# Patient Record
Sex: Female | Born: 1955 | Race: White | Hispanic: No | Marital: Married | Smoking: Former smoker
Health system: Southern US, Community
[De-identification: ages and names within clinical notes are randomized; demographics above are authoritative.]

## PROBLEM LIST (undated history)

## (undated) DIAGNOSIS — F419 Anxiety disorder, unspecified: Secondary | ICD-10-CM

## (undated) DIAGNOSIS — M1990 Unspecified osteoarthritis, unspecified site: Secondary | ICD-10-CM

## (undated) DIAGNOSIS — R0600 Dyspnea, unspecified: Secondary | ICD-10-CM

## (undated) DIAGNOSIS — E041 Nontoxic single thyroid nodule: Secondary | ICD-10-CM

## (undated) DIAGNOSIS — K219 Gastro-esophageal reflux disease without esophagitis: Secondary | ICD-10-CM

## (undated) DIAGNOSIS — I2510 Atherosclerotic heart disease of native coronary artery without angina pectoris: Secondary | ICD-10-CM

## (undated) DIAGNOSIS — J189 Pneumonia, unspecified organism: Secondary | ICD-10-CM

## (undated) DIAGNOSIS — K449 Diaphragmatic hernia without obstruction or gangrene: Secondary | ICD-10-CM

## (undated) DIAGNOSIS — G4730 Sleep apnea, unspecified: Secondary | ICD-10-CM

## (undated) DIAGNOSIS — M797 Fibromyalgia: Secondary | ICD-10-CM

## (undated) DIAGNOSIS — N200 Calculus of kidney: Secondary | ICD-10-CM

## (undated) DIAGNOSIS — D649 Anemia, unspecified: Secondary | ICD-10-CM

## (undated) DIAGNOSIS — Z87442 Personal history of urinary calculi: Secondary | ICD-10-CM

## (undated) DIAGNOSIS — F32A Depression, unspecified: Secondary | ICD-10-CM

## (undated) DIAGNOSIS — I82409 Acute embolism and thrombosis of unspecified deep veins of unspecified lower extremity: Secondary | ICD-10-CM

## (undated) DIAGNOSIS — G43909 Migraine, unspecified, not intractable, without status migrainosus: Secondary | ICD-10-CM

## (undated) DIAGNOSIS — J449 Chronic obstructive pulmonary disease, unspecified: Secondary | ICD-10-CM

## (undated) DIAGNOSIS — E042 Nontoxic multinodular goiter: Secondary | ICD-10-CM

## (undated) HISTORY — DX: Calculus of kidney: N20.0

## (undated) HISTORY — PX: TONSILLECTOMY: SUR1361

## (undated) HISTORY — PX: PAROTIDECTOMY: SUR1003

## (undated) HISTORY — DX: Gastro-esophageal reflux disease without esophagitis: K21.9

## (undated) HISTORY — DX: Atherosclerotic heart disease of native coronary artery without angina pectoris: I25.10

## (undated) HISTORY — PX: ABDOMINAL HYSTERECTOMY: SHX81

## (undated) HISTORY — DX: Migraine, unspecified, not intractable, without status migrainosus: G43.909

## (undated) HISTORY — DX: Diaphragmatic hernia without obstruction or gangrene: K44.9

## (undated) HISTORY — PX: HERNIA REPAIR: SHX51

## (undated) HISTORY — PX: KIDNEY STONE SURGERY: SHX686

## (undated) HISTORY — DX: Fibromyalgia: M79.7

## (undated) HISTORY — PX: BIOPSY THYROID: PRO38

## (undated) HISTORY — PX: LAPAROSCOPIC NISSEN FUNDOPLICATION: SHX1932

## (undated) HISTORY — DX: Nontoxic single thyroid nodule: E04.1

## (undated) HISTORY — DX: Depression, unspecified: F32.A

---

## 1994-12-14 DIAGNOSIS — I2699 Other pulmonary embolism without acute cor pulmonale: Secondary | ICD-10-CM

## 1994-12-14 HISTORY — DX: Other pulmonary embolism without acute cor pulmonale: I26.99

## 2014-05-10 DIAGNOSIS — K219 Gastro-esophageal reflux disease without esophagitis: Secondary | ICD-10-CM

## 2014-05-10 DIAGNOSIS — M797 Fibromyalgia: Secondary | ICD-10-CM

## 2021-02-12 DIAGNOSIS — Z86711 Personal history of pulmonary embolism: Secondary | ICD-10-CM

## 2021-02-15 DIAGNOSIS — F419 Anxiety disorder, unspecified: Secondary | ICD-10-CM | POA: Insufficient documentation

## 2021-07-01 DIAGNOSIS — G4733 Obstructive sleep apnea (adult) (pediatric): Secondary | ICD-10-CM

## 2021-07-01 DIAGNOSIS — D649 Anemia, unspecified: Secondary | ICD-10-CM | POA: Insufficient documentation

## 2021-07-01 DIAGNOSIS — Z86718 Personal history of other venous thrombosis and embolism: Secondary | ICD-10-CM | POA: Insufficient documentation

## 2021-07-01 DIAGNOSIS — I825Z9 Chronic embolism and thrombosis of unspecified deep veins of unspecified distal lower extremity: Secondary | ICD-10-CM | POA: Insufficient documentation

## 2021-07-01 DIAGNOSIS — I2510 Atherosclerotic heart disease of native coronary artery without angina pectoris: Secondary | ICD-10-CM | POA: Insufficient documentation

## 2021-07-01 HISTORY — DX: Obstructive sleep apnea (adult) (pediatric): G47.33

## 2021-08-25 ENCOUNTER — Other Ambulatory Visit: Payer: Self-pay

## 2021-08-25 ENCOUNTER — Ambulatory Visit
Admission: EM | Admit: 2021-08-25 | Discharge: 2021-08-25 | Disposition: A | Discharge status: Home / Self Care | Payer: Medicare Other

## 2021-08-25 DIAGNOSIS — L309 Dermatitis, unspecified: Secondary | ICD-10-CM | POA: Diagnosis not present

## 2021-08-25 DIAGNOSIS — R21 Rash and other nonspecific skin eruption: Secondary | ICD-10-CM | POA: Diagnosis not present

## 2021-08-25 DIAGNOSIS — L509 Urticaria, unspecified: Secondary | ICD-10-CM

## 2021-08-25 HISTORY — DX: Chronic obstructive pulmonary disease, unspecified: J44.9

## 2021-08-25 MED ORDER — METHYLPREDNISOLONE SODIUM SUCC 125 MG IJ SOLR
62.5000 mg | Freq: Once | INTRAMUSCULAR | Status: AC
  Administered 2021-08-25: 62.5 mg via INTRAMUSCULAR

## 2021-08-25 MED ORDER — PREDNISONE 10 MG (21) PO TBPK: ORAL_TABLET | Freq: Every day | ORAL | 0 refills | Status: DC

## 2021-08-25 MED ORDER — FLUCONAZOLE 150 MG PO TABS: 150.0000 mg | ORAL_TABLET | Freq: Every day | ORAL | 1 refills | Status: AC

## 2021-08-25 NOTE — Discharge Instructions (Addendum)
You were given a steroid shot SoluMedrol 62.5mg  today to help with rash/itching. Then start Prednisone 10mg  tablets- take 6 tablets today and tomorrow and then decrease by 1 tablet every 2 days until finished. May use OTC Pepcid 20mg  twice a day to help with itching. Apply cool compresses to area for comfort. May take Diflucan 150mg  once daily for 1 week to help with probable fungal rash. Continue to monitor symptoms. Recommend call Dermatology to schedule appointment for follow-up or see your PCP for further evaluation and possible referral.

## 2021-08-25 NOTE — ED Triage Notes (Signed)
Pt presents with rash on face and lower back that was treated with steroids and ten returned

## 2021-08-25 NOTE — ED Provider Notes (Signed)
[facility]    CSN: [id number] Arrival date & time: 08/25/21  1044      History   Chief Complaint Chief Complaint  Patient presents with   Rash    HPI Norma Anthony is a 65 y.o. female.   65 year old female accompanied by her husband with concern over rash that started on her face. Was seen by her PCP in [place] about 2 1/2 weeks ago for a rash that started on her face about 3 weeks ago. Also had a few lesions on her body. Was treated with a Kenalog steroid shot and placed on Prednisone 6 day taper dose pack. Lesions resolved on face but then return a few days ago. Itchy and spreading with lesions on mid to lower back and scattered lesions on lower legs, abdomen and arms. Area of redness under right breast that she feels may be yeast/fungal. She has tried using topical anti-fungal agent under breast area with no relief. She has tried OTC Cortisone-10 with no relief. No known new detergents, soap or medications. No new foods. Has moved up to [place] area recently but plans to keep her PCP in [place]. No other family members with rash or similar symptoms. No fever, nausea or vomiting. Other chronic health issues include sleep apnea, COPD, GERD, history of DVT, anxiety, and migraine headaches. Currently on Xarelto, aspirin, Protonix, Gabapentin, Cymbalta, Wellbutrin, Soma daily and Xanax and Albuterol prn.   The history is provided by the patient.   Past Medical History:  Diagnosis Date   COPD (chronic obstructive pulmonary disease) (HCC)     There are no problems to display for this patient.   Past Surgical History:  Procedure Laterality Date   ABDOMINAL HYSTERECTOMY     HERNIA REPAIR     TONSILLECTOMY      OB History   No obstetric history on file.      Home Medications    Prior to Admission medications   Medication Sig Start Date End Date Taking? Authorizing Provider  albuterol (VENTOLIN HFA) 108 (90 Base) MCG/ACT inhaler Inhale into the  lungs. 05/12/21  Yes [provider]  buPROPion (WELLBUTRIN SR) 150 MG 12 hr tablet  04/09/20  Yes [provider]  carisoprodol (SOMA) 350 MG tablet Take by mouth. 01/02/21  Yes [provider]  DULoxetine (CYMBALTA) 60 MG capsule Take by mouth. 09/14/18  Yes [provider]  fluconazole (DIFLUCAN) 150 MG tablet Take 1 tablet (150 mg total) by mouth daily for 7 days. 08/25/21 09/01/21 Yes Pascale Maves, Ali LoweAnn Berry, NP  gabapentin (NEURONTIN) 300 MG capsule  02/25/20  Yes [provider]  pantoprazole (PROTONIX) 40 MG tablet Take by mouth. 06/30/16  Yes [provider]  predniSONE (STERAPRED UNI-PAK 21 TAB) 10 MG (21) TBPK tablet Take by mouth daily. Take 6 tabs by mouth daily  for 2 days, then 5 tabs for 2 days, then 4 tabs for 2 days, then 3 tabs for 2 days, 2 tabs for 2 days, then 1 tab by mouth daily for 2 days 08/25/21  Yes Canary Fister, Ali LoweAnn Berry, NP  ALPRAZolam Prudy Feeler(XANAX) 1 MG tablet Take by mouth.    [provider]  aspirin 81 MG EC tablet Take by mouth.    [provider]  XARELTO 20 MG TABS tablet Take 20 mg by mouth daily. 08/21/21   [provider]    Family History Family History  Family history unknown: Yes    Social History Social History   Tobacco  Use   Smoking status: Never   Smokeless tobacco: Never  Substance Use Topics   Alcohol use: Not Currently   Drug use: Not Currently     Allergies   Pregabalin, Droperidol, and Ergotamine-caffeine   Review of Systems Review of Systems  Constitutional:  Positive for fatigue. Negative for activity change, appetite change, chills and fever.  HENT:  Negative for mouth sores, sore throat and trouble swallowing.   Respiratory:  Negative for chest tightness and wheezing.   Gastrointestinal:  Negative for nausea and vomiting.  Musculoskeletal:  Positive for arthralgias and back pain. Negative for neck pain.  Skin:  Positive for color change and rash.  Allergic/Immunologic:  Negative for environmental allergies and food allergies.  Neurological:  Negative for dizziness, tremors, seizures, syncope, facial asymmetry, speech difficulty and numbness.  Hematological:  Negative for adenopathy. Bruises/bleeds easily.  Psychiatric/Behavioral:  Positive for sleep disturbance.     Physical Exam Triage Vital Signs ED Triage Vitals  Enc Vitals Group     BP 08/25/21 1427 110/77     Pulse Rate 08/25/21 1427 77     Resp 08/25/21 1427 20     Temp 08/25/21 1427 98.1 F (36.7 C)     Temp src --      SpO2 08/25/21 1427 91 %     Weight --      Height --      Head Circumference --      Peak Flow --      Pain Score 08/25/21 1419 0     Pain Loc --      Pain Edu? --      Excl. in GC? --    No data found.  Updated Vital Signs BP 110/77   Pulse 77   Temp 98.1 F (36.7 C)   Resp 20   SpO2 91% Comment: states it is baseline  Visual Acuity Right Eye Distance:   Left Eye Distance:   Bilateral Distance:    Right Eye Near:   Left Eye Near:    Bilateral Near:     Physical Exam Vitals and nursing note reviewed.  Constitutional:      General: She is awake. She is not in acute distress.    Appearance: She is well-developed, well-groomed and overweight.     Comments: She is sitting on the exam table in no acute distress but appears uncomfortable due to itchy rash.   HENT:     Head: Normocephalic and atraumatic.      Comments: Small pink papular lesions present on face, mainly on bridge of nose and then mid to lower cheeks bilaterally. Also around mouth. No lesions around eyes or forehead. No surrounding erythema. NO signs of bacterial infection.     Right Ear: Hearing and external ear normal.     Left Ear: Hearing and external ear normal.     Nose: No congestion or rhinorrhea.     Mouth/Throat:     Lips: Pink.     Mouth: Mucous membranes are moist.     Pharynx: Oropharynx is clear.  Eyes:     Extraocular Movements: Extraocular movements intact.      Conjunctiva/sclera: Conjunctivae normal.  Cardiovascular:     Rate and Rhythm: Normal rate.  Pulmonary:     Effort: Pulmonary effort is normal.  Musculoskeletal:     Cervical back: Normal range of motion and neck supple.  Lymphadenopathy:     Cervical: No cervical adenopathy.  Skin:    General: Skin is  warm.     Capillary Refill: Capillary refill takes less than 2 seconds.     Findings: Lesion and rash present. Rash is macular, papular and urticarial. Rash is not crusting, nodular, purpuric, pustular, scaling or vesicular.          Comments: A few scattered pink papular lesion on lower legs and lower arms but most lesions concentrated on face.  Then cluster of pink urticarial appearing lesions on mid to lower back. No vesicular lesions. Non-tender. No secondary bacterial infection. Pink mostly macular rash present under right breast with distinct edges and slightly moist. Non-tender. No secondary bacterial infection.   Neurological:     General: No focal deficit present.     Mental Status: She is alert and oriented to person, place, and time.  Psychiatric:        Mood and Affect: Mood normal.        Behavior: Behavior normal. Behavior is cooperative.        Thought Content: Thought content normal.        Judgment: Judgment normal.     UC Treatments / Results  Labs (all labs ordered are listed, but only abnormal results are displayed) Labs Reviewed - No data to display  EKG   Radiology No results found.  Procedures Procedures (including critical care time)  Medications Ordered in UC Medications  methylPREDNISolone sodium succinate (SOLU-MEDROL) 125 mg/2 mL injection 62.5 mg (62.5 mg Intramuscular Given 08/25/21 1539)    Initial Impression / Assessment and Plan / UC Course  I have reviewed the triage vital signs and the nursing notes.  Pertinent labs & imaging results that were available during my care of the patient were reviewed by me and considered in my medical  decision making (see chart for details).    Discussed that lesions/rash of face appear to be a dermatitis. Pattern consistent with face covering but patient denies any new mask or extended use of face coverings. No new foods.  Reviewed that lesions on back appear more hive-like so again- having a reaction to some substance but unknown.  Lesions under breast do appear fungal and will treat with oral Diflucan 150mg  once daily per patient's request. May need to treat with different oral anti-fungal if not resolving. Try to keep area clean and dry.  Gave Solu-Medrol 62.5mg  IM today per patient's request to help with rash and itching. Recommend start Prednisone 10mg  12 day dose pack as directed. May use OTC Pepcid 20mg  twice a day to try to help with itching. Apply cool compresses to back and face for comfort. Discussed that rash may continue until source of irritation discovered. Patient requests information for local dermatologist. May call Dermatologist in [place] today to schedule appointment for further evaluation. If rash gets worse or any fever develops, recommend go to the ER. Otherwise, follow-up with Dermatology or her PCP for further evaluation and referral to local dermatologist.  Final Clinical Impressions(s) / UC Diagnoses   Final diagnoses:  Dermatitis of face  Urticaria of unknown origin  Rash and nonspecific skin eruption     Discharge Instructions      You were given a steroid shot SoluMedrol 62.5mg  today to help with rash/itching. Then start Prednisone 10mg  tablets- take 6 tablets today and tomorrow and then decrease by 1 tablet every 2 days until finished. May use OTC Pepcid 20mg  twice a day to help with itching. Apply cool compresses to area for comfort. May take Diflucan 150mg  once daily for  1 week to help with probable fungal rash. Continue to monitor symptoms. Recommend call [facility] to schedule appointment for follow-up or see your PCP for further  evaluation and possible referral.      ED Prescriptions     Medication Sig Dispense Auth. Provider   predniSONE (STERAPRED UNI-PAK 21 TAB) 10 MG (21) TBPK tablet Take by mouth daily. Take 6 tabs by mouth daily  for 2 days, then 5 tabs for 2 days, then 4 tabs for 2 days, then 3 tabs for 2 days, 2 tabs for 2 days, then 1 tab by mouth daily for 2 days 42 tablet Charvez Voorhies, Ali Lowe, NP   fluconazole (DIFLUCAN) 150 MG tablet Take 1 tablet (150 mg total) by mouth daily for 7 days. 7 tablet Nazli Penn, Ali Lowe, NP      PDMP not reviewed this encounter.   Sudie Grumbling, NP 08/26/21 1523

## 2021-11-17 ENCOUNTER — Other Ambulatory Visit (HOSPITAL_COMMUNITY): Payer: Self-pay | Admitting: Internal Medicine

## 2021-11-17 DIAGNOSIS — Z1231 Encounter for screening mammogram for malignant neoplasm of breast: Secondary | ICD-10-CM

## 2021-11-24 ENCOUNTER — Ambulatory Visit (HOSPITAL_COMMUNITY)
Admission: RE | Admit: 2021-11-24 | Discharge: 2021-11-24 | Disposition: A | Discharge status: Home / Self Care | Payer: Medicare Other | Source: Ambulatory Visit | Attending: Internal Medicine | Admitting: Internal Medicine

## 2021-11-24 ENCOUNTER — Other Ambulatory Visit: Payer: Self-pay

## 2021-11-24 DIAGNOSIS — Z1231 Encounter for screening mammogram for malignant neoplasm of breast: Secondary | ICD-10-CM | POA: Diagnosis not present

## 2021-12-01 ENCOUNTER — Inpatient Hospital Stay
Admission: RE | Admit: 2021-12-01 | Discharge: 2021-12-01 | Disposition: A | Discharge status: Home / Self Care | Payer: Self-pay | Source: Ambulatory Visit | Attending: Internal Medicine | Admitting: Internal Medicine

## 2021-12-01 ENCOUNTER — Other Ambulatory Visit (HOSPITAL_COMMUNITY): Payer: Self-pay | Admitting: Internal Medicine

## 2021-12-01 DIAGNOSIS — Z1231 Encounter for screening mammogram for malignant neoplasm of breast: Secondary | ICD-10-CM

## 2022-03-22 ENCOUNTER — Inpatient Hospital Stay (HOSPITAL_COMMUNITY)
Admission: EM | Admit: 2022-03-22 | Discharge: 2022-03-24 | DRG: 193 | Disposition: A | Discharge status: Home / Self Care | Payer: Medicare Other | Attending: Internal Medicine | Admitting: Internal Medicine

## 2022-03-22 ENCOUNTER — Encounter (HOSPITAL_COMMUNITY): Payer: Self-pay

## 2022-03-22 ENCOUNTER — Other Ambulatory Visit: Payer: Self-pay

## 2022-03-22 ENCOUNTER — Emergency Department (HOSPITAL_COMMUNITY): Payer: Medicare Other

## 2022-03-22 DIAGNOSIS — J9811 Atelectasis: Secondary | ICD-10-CM | POA: Diagnosis present

## 2022-03-22 DIAGNOSIS — Z91199 Patient's noncompliance with other medical treatment and regimen due to unspecified reason: Secondary | ICD-10-CM

## 2022-03-22 DIAGNOSIS — J9621 Acute and chronic respiratory failure with hypoxia: Secondary | ICD-10-CM | POA: Diagnosis not present

## 2022-03-22 DIAGNOSIS — Z86718 Personal history of other venous thrombosis and embolism: Secondary | ICD-10-CM

## 2022-03-22 DIAGNOSIS — F32A Depression, unspecified: Secondary | ICD-10-CM | POA: Diagnosis present

## 2022-03-22 DIAGNOSIS — Z7982 Long term (current) use of aspirin: Secondary | ICD-10-CM

## 2022-03-22 DIAGNOSIS — R079 Chest pain, unspecified: Secondary | ICD-10-CM

## 2022-03-22 DIAGNOSIS — E041 Nontoxic single thyroid nodule: Secondary | ICD-10-CM | POA: Diagnosis present

## 2022-03-22 DIAGNOSIS — K5900 Constipation, unspecified: Secondary | ICD-10-CM | POA: Diagnosis present

## 2022-03-22 DIAGNOSIS — J9601 Acute respiratory failure with hypoxia: Secondary | ICD-10-CM

## 2022-03-22 DIAGNOSIS — J189 Pneumonia, unspecified organism: Secondary | ICD-10-CM

## 2022-03-22 DIAGNOSIS — K219 Gastro-esophageal reflux disease without esophagitis: Secondary | ICD-10-CM | POA: Diagnosis not present

## 2022-03-22 DIAGNOSIS — G4733 Obstructive sleep apnea (adult) (pediatric): Secondary | ICD-10-CM

## 2022-03-22 DIAGNOSIS — R911 Solitary pulmonary nodule: Secondary | ICD-10-CM | POA: Diagnosis present

## 2022-03-22 DIAGNOSIS — J441 Chronic obstructive pulmonary disease with (acute) exacerbation: Secondary | ICD-10-CM | POA: Diagnosis present

## 2022-03-22 DIAGNOSIS — Z79899 Other long term (current) drug therapy: Secondary | ICD-10-CM

## 2022-03-22 DIAGNOSIS — J440 Chronic obstructive pulmonary disease with acute lower respiratory infection: Secondary | ICD-10-CM | POA: Diagnosis present

## 2022-03-22 DIAGNOSIS — M797 Fibromyalgia: Secondary | ICD-10-CM | POA: Diagnosis present

## 2022-03-22 DIAGNOSIS — Z7901 Long term (current) use of anticoagulants: Secondary | ICD-10-CM

## 2022-03-22 DIAGNOSIS — F1120 Opioid dependence, uncomplicated: Secondary | ICD-10-CM | POA: Diagnosis present

## 2022-03-22 DIAGNOSIS — J449 Chronic obstructive pulmonary disease, unspecified: Secondary | ICD-10-CM

## 2022-03-22 DIAGNOSIS — J181 Lobar pneumonia, unspecified organism: Secondary | ICD-10-CM | POA: Diagnosis not present

## 2022-03-22 DIAGNOSIS — Z20822 Contact with and (suspected) exposure to covid-19: Secondary | ICD-10-CM | POA: Diagnosis present

## 2022-03-22 DIAGNOSIS — Z86711 Personal history of pulmonary embolism: Secondary | ICD-10-CM

## 2022-03-22 DIAGNOSIS — Z888 Allergy status to other drugs, medicaments and biological substances status: Secondary | ICD-10-CM

## 2022-03-22 DIAGNOSIS — F419 Anxiety disorder, unspecified: Secondary | ICD-10-CM | POA: Diagnosis present

## 2022-03-22 LAB — PROCALCITONIN: Procalcitonin: 0.17 ng/mL

## 2022-03-22 LAB — CBC WITH DIFFERENTIAL/PLATELET
Abs Immature Granulocytes: 0.09 10*3/uL — ABNORMAL HIGH (ref 0.00–0.07)
Basophils Absolute: 0.1 10*3/uL (ref 0.0–0.1)
Basophils Relative: 0 %
Eosinophils Absolute: 0.2 10*3/uL (ref 0.0–0.5)
Eosinophils Relative: 1 %
HCT: 36.8 % (ref 36.0–46.0)
Hemoglobin: 11.5 g/dL — ABNORMAL LOW (ref 12.0–15.0)
Immature Granulocytes: 1 %
Lymphocytes Relative: 14 %
Lymphs Abs: 2.6 10*3/uL (ref 0.7–4.0)
MCH: 31.8 pg (ref 26.0–34.0)
MCHC: 31.3 g/dL (ref 30.0–36.0)
MCV: 101.7 fL — ABNORMAL HIGH (ref 80.0–100.0)
Monocytes Absolute: 0.8 10*3/uL (ref 0.1–1.0)
Monocytes Relative: 4 %
Neutro Abs: 15.2 10*3/uL — ABNORMAL HIGH (ref 1.7–7.7)
Neutrophils Relative %: 80 %
Platelets: 174 10*3/uL (ref 150–400)
RBC: 3.62 MIL/uL — ABNORMAL LOW (ref 3.87–5.11)
RDW: 14.1 % (ref 11.5–15.5)
WBC: 18.9 10*3/uL — ABNORMAL HIGH (ref 4.0–10.5)
nRBC: 0 % (ref 0.0–0.2)

## 2022-03-22 LAB — URINALYSIS, ROUTINE W REFLEX MICROSCOPIC
Bilirubin Urine: NEGATIVE
Glucose, UA: NEGATIVE mg/dL
Ketones, ur: NEGATIVE mg/dL
Nitrite: NEGATIVE
Protein, ur: NEGATIVE mg/dL
Specific Gravity, Urine: 1.012 (ref 1.005–1.030)
pH: 5 (ref 5.0–8.0)

## 2022-03-22 LAB — TROPONIN I (HIGH SENSITIVITY)
Troponin I (High Sensitivity): 3 ng/L (ref <18)
Troponin I (High Sensitivity): 14 ng/L (ref <18)

## 2022-03-22 LAB — BASIC METABOLIC PANEL
Anion gap: 7 (ref 5–15)
BUN: 14 mg/dL (ref 8–23)
CO2: 28 mmol/L (ref 22–32)
Calcium: 8.5 mg/dL — ABNORMAL LOW (ref 8.9–10.3)
Chloride: 106 mmol/L (ref 98–111)
Creatinine, Ser: 0.68 mg/dL (ref 0.44–1.00)
GFR, Estimated: >60 mL/min (ref >60)
Glucose, Bld: 98 mg/dL (ref 70–99)
Potassium: 3.8 mmol/L (ref 3.5–5.1)
Sodium: 141 mmol/L (ref 135–145)

## 2022-03-22 LAB — RESP PANEL BY RT-PCR (FLU A&B, COVID) ARPGX2
Influenza A by PCR: NEGATIVE
Influenza B by PCR: NEGATIVE
SARS Coronavirus 2 by RT PCR: NEGATIVE

## 2022-03-22 MED ORDER — IPRATROPIUM-ALBUTEROL 0.5-2.5 (3) MG/3ML IN SOLN
RESPIRATORY_TRACT | Status: AC
  Administered 2022-03-22: 3 mL
  Filled 2022-03-22: qty 3

## 2022-03-22 MED ORDER — ALPRAZOLAM 0.5 MG PO TABS
0.5000 mg | ORAL_TABLET | Freq: Every day | ORAL | Status: DC
  Administered 2022-03-22 – 2022-03-23 (×2): 0.5 mg via ORAL
  Filled 2022-03-22 (×2): qty 1

## 2022-03-22 MED ORDER — MORPHINE SULFATE (PF) 2 MG/ML IV SOLN
2.0000 mg | INTRAVENOUS | Status: DC | PRN
  Administered 2022-03-22: 2 mg via INTRAVENOUS
  Filled 2022-03-22: qty 1

## 2022-03-22 MED ORDER — IPRATROPIUM-ALBUTEROL 0.5-2.5 (3) MG/3ML IN SOLN
3.0000 mL | Freq: Once | RESPIRATORY_TRACT | Status: AC
Start: 2022-03-22 — End: 2022-03-22
  Administered 2022-03-22: 3 mL via RESPIRATORY_TRACT
  Filled 2022-03-22: qty 3

## 2022-03-22 MED ORDER — PREDNISONE 20 MG PO TABS
40.0000 mg | ORAL_TABLET | Freq: Every day | ORAL | Status: DC
  Administered 2022-03-23: 40 mg via ORAL
  Filled 2022-03-22: qty 2

## 2022-03-22 MED ORDER — SODIUM CHLORIDE 0.9 % IV SOLN
500.0000 mg | Freq: Once | INTRAVENOUS | Status: AC
  Administered 2022-03-22: 500 mg via INTRAVENOUS
  Filled 2022-03-22: qty 5

## 2022-03-22 MED ORDER — ALBUTEROL SULFATE (2.5 MG/3ML) 0.083% IN NEBU: 2.5000 mg | INHALATION_SOLUTION | RESPIRATORY_TRACT | Status: DC | PRN

## 2022-03-22 MED ORDER — ACETAMINOPHEN 650 MG RE SUPP: 650.0000 mg | Freq: Four times a day (QID) | RECTAL | Status: DC | PRN

## 2022-03-22 MED ORDER — ONDANSETRON HCL 4 MG/2ML IJ SOLN
4.0000 mg | Freq: Once | INTRAMUSCULAR | Status: AC
Start: 2022-03-22 — End: 2022-03-22
  Administered 2022-03-22: 4 mg via INTRAVENOUS
  Filled 2022-03-22: qty 2

## 2022-03-22 MED ORDER — ACETAMINOPHEN 325 MG PO TABS
650.0000 mg | ORAL_TABLET | Freq: Four times a day (QID) | ORAL | Status: DC | PRN
  Administered 2022-03-22 – 2022-03-23 (×3): 650 mg via ORAL
  Filled 2022-03-22 (×4): qty 2

## 2022-03-22 MED ORDER — SODIUM CHLORIDE 0.9 % IV SOLN
2.0000 g | INTRAVENOUS | Status: DC
  Administered 2022-03-23 – 2022-03-24 (×2): 2 g via INTRAVENOUS
  Filled 2022-03-22 (×2): qty 20

## 2022-03-22 MED ORDER — POLYETHYLENE GLYCOL 3350 17 G PO PACK: 17.0000 g | PACK | Freq: Every day | ORAL | Status: DC | PRN

## 2022-03-22 MED ORDER — IPRATROPIUM-ALBUTEROL 0.5-2.5 (3) MG/3ML IN SOLN
3.0000 mL | Freq: Four times a day (QID) | RESPIRATORY_TRACT | Status: DC
  Administered 2022-03-23 – 2022-03-24 (×6): 3 mL via RESPIRATORY_TRACT
  Filled 2022-03-22 (×6): qty 3

## 2022-03-22 MED ORDER — OXYCODONE HCL 5 MG PO TABS
5.0000 mg | ORAL_TABLET | ORAL | Status: DC | PRN
  Administered 2022-03-22 – 2022-03-24 (×6): 5 mg via ORAL
  Filled 2022-03-22 (×6): qty 1

## 2022-03-22 MED ORDER — ASPIRIN EC 81 MG PO TBEC
81.0000 mg | DELAYED_RELEASE_TABLET | Freq: Every day | ORAL | Status: DC
  Administered 2022-03-22 – 2022-03-24 (×3): 81 mg via ORAL
  Filled 2022-03-22 (×3): qty 1

## 2022-03-22 MED ORDER — GABAPENTIN 300 MG PO CAPS: 300.0000 mg | ORAL_CAPSULE | ORAL | Status: DC | PRN

## 2022-03-22 MED ORDER — SODIUM CHLORIDE 0.9 % IV SOLN
500.0000 mg | INTRAVENOUS | Status: DC
  Administered 2022-03-23 – 2022-03-24 (×2): 500 mg via INTRAVENOUS
  Filled 2022-03-22 (×2): qty 5

## 2022-03-22 MED ORDER — PANTOPRAZOLE SODIUM 40 MG PO TBEC
40.0000 mg | DELAYED_RELEASE_TABLET | Freq: Every day | ORAL | Status: DC
  Administered 2022-03-22 – 2022-03-24 (×3): 40 mg via ORAL
  Filled 2022-03-22 (×3): qty 1

## 2022-03-22 MED ORDER — RIVAROXABAN 20 MG PO TABS
20.0000 mg | ORAL_TABLET | Freq: Every day | ORAL | Status: DC
  Administered 2022-03-22 – 2022-03-24 (×3): 20 mg via ORAL
  Filled 2022-03-22 (×3): qty 1

## 2022-03-22 MED ORDER — CEFTRIAXONE SODIUM 2 G IJ SOLR
2.0000 g | Freq: Once | INTRAMUSCULAR | Status: AC
  Administered 2022-03-22: 2 g via INTRAVENOUS
  Filled 2022-03-22: qty 20

## 2022-03-22 MED ORDER — ONDANSETRON HCL 4 MG PO TABS: 4.0000 mg | ORAL_TABLET | Freq: Four times a day (QID) | ORAL | Status: DC | PRN

## 2022-03-22 MED ORDER — SODIUM CHLORIDE 0.9 % IV BOLUS
1000.0000 mL | Freq: Once | INTRAVENOUS | Status: AC
Start: 2022-03-22 — End: 2022-03-22
  Administered 2022-03-22: 1000 mL via INTRAVENOUS

## 2022-03-22 MED ORDER — BUPROPION HCL ER (SR) 150 MG PO TB12
150.0000 mg | ORAL_TABLET | Freq: Every day | ORAL | Status: DC
  Administered 2022-03-23 – 2022-03-24 (×2): 150 mg via ORAL
  Filled 2022-03-22 (×2): qty 1

## 2022-03-22 MED ORDER — ONDANSETRON HCL 4 MG/2ML IJ SOLN
4.0000 mg | Freq: Four times a day (QID) | INTRAMUSCULAR | Status: DC | PRN
Start: 2022-03-22 — End: 2022-03-24

## 2022-03-22 MED ORDER — DULOXETINE HCL 60 MG PO CPEP
60.0000 mg | ORAL_CAPSULE | Freq: Every day | ORAL | Status: DC
  Administered 2022-03-22 – 2022-03-24 (×3): 60 mg via ORAL
  Filled 2022-03-22 (×3): qty 1

## 2022-03-22 MED ORDER — ACETAMINOPHEN 500 MG PO TABS
1000.0000 mg | ORAL_TABLET | Freq: Once | ORAL | Status: AC
Start: 2022-03-22 — End: 2022-03-22
  Administered 2022-03-22: 1000 mg via ORAL
  Filled 2022-03-22: qty 2

## 2022-03-22 NOTE — Assessment & Plan Note (Signed)
Continue Protonix °

## 2022-03-22 NOTE — ED Provider Notes (Signed)
?[facility] ?Provider Note ? ? ?CSN: [id number] ?Arrival date & time: 03/22/22  1316 ? ?  ? ?History ? ?Chief Complaint  ?Patient presents with  ? Shortness of Breath  ? ? ?Norma Anthony is a 66 y.o. female.  With past medical history of COPD who presents to the emergency department with shortness of breath. ? ?Patient states that around 4 AM this morning she woke up with productive cough and feeling febrile.  She states that she did not have her BiPAP on and she asked her husband to grab her oxygen.  She states that she measured her oxygen at home which was 69% on room air.  She denies wearing oxygen at all times and has it "just when I need it."  She states that she felt short of breath this morning as well which has improved.  Has had wheezing this morning.  She also notes central chest pain that is nonradiating.  She states that "I feel like chest pains from coughing."  She has also had some nausea this morning.  Denies palpitations, lightheadedness or dizziness, abdominal pain, vomiting, diarrhea.  Denies sick contacts. ? ? ?Shortness of Breath ?Associated symptoms: chest pain, cough, fever, headaches and wheezing   ?Associated symptoms: no abdominal pain and no vomiting   ? ?  ? ?Home Medications ?Prior to Admission medications   ?Medication Sig Start Date End Date Taking? Authorizing Provider  ?albuterol (VENTOLIN HFA) 108 (90 Base) MCG/ACT inhaler Inhale into the lungs. 05/12/21   [provider]  ?ALPRAZolam Prudy Feeler) 1 MG tablet Take by mouth.    [provider]  ?aspirin 81 MG EC tablet Take by mouth.    [provider]  ?buPROPion [facility] SR) 150 MG 12 hr tablet  04/09/20   [provider]  ?carisoprodol (SOMA) 350 MG tablet Take by mouth. 01/02/21   [provider]  ?DULoxetine (CYMBALTA) 60 MG capsule Take by mouth. 09/14/18   [provider]  ?gabapentin (NEURONTIN) 300 MG capsule  02/25/20   [provider]   ?pantoprazole (PROTONIX) 40 MG tablet Take by mouth. 06/30/16   [provider]  ?predniSONE (STERAPRED UNI-PAK 21 TAB) 10 MG (21) TBPK tablet Take by mouth daily. Take 6 tabs by mouth daily  for 2 days, then 5 tabs for 2 days, then 4 tabs for 2 days, then 3 tabs for 2 days, 2 tabs for 2 days, then 1 tab by mouth daily for 2 days 08/25/21   Sudie Grumbling, NP  ?XARELTO 20 MG TABS tablet Take 20 mg by mouth daily. 08/21/21   [provider]  ?   ? ?Allergies    ?Pregabalin, Droperidol, and Ergotamine-caffeine   ? ?Review of Systems   ?Review of Systems  ?Constitutional:  Positive for fever.  ?Respiratory:  Positive for cough, shortness of breath and wheezing.   ?Cardiovascular:  Positive for chest pain. Negative for palpitations and leg swelling.  ?Gastrointestinal:  Positive for nausea. Negative for abdominal pain, diarrhea and vomiting.  ?Neurological:  Positive for headaches. Negative for dizziness and light-headedness.  ?All other systems reviewed and are negative. ? ?Physical Exam ?Updated Vital Signs ?BP 125/72 (BP Location: Left Arm)   Pulse 100   Temp (!) 100.4 ?F (38 ?C) (Oral)   Resp 20   Wt 105.2 kg   SpO2 100%  ?Physical Exam ?Vitals and nursing note reviewed.  ?Constitutional:   ?   General: She is not in acute distress. ?  Appearance: She is well-developed. She is obese. She is ill-appearing. She is not toxic-appearing.  ?HENT:  ?   Head: Normocephalic and atraumatic.  ?   Nose: Nose normal.  ?   Mouth/Throat:  ?   Mouth: Mucous membranes are moist.  ?   Pharynx: Oropharynx is clear.  ?Eyes:  ?   General: No scleral icterus. ?   Extraocular Movements: Extraocular movements intact.  ?   Pupils: Pupils are equal, round, and reactive to light.  ?Neck:  ?   Vascular: No JVD.  ?Cardiovascular:  ?   Rate and Rhythm: Regular rhythm. Tachycardia present.  ?   Pulses: Normal pulses.  ?   Heart sounds: Normal heart sounds. No murmur heard. ?Pulmonary:  ?   Effort: Tachypnea present. No  accessory muscle usage or respiratory distress.  ?   Breath sounds: Examination of the right-upper field reveals wheezing. Examination of the left-upper field reveals wheezing. Examination of the left-lower field reveals rhonchi and rales. Wheezing, rhonchi and rales present.  ?Chest:  ?   Chest wall: No tenderness.  ?Abdominal:  ?   General: Bowel sounds are normal. There is no distension.  ?   Palpations: Abdomen is soft.  ?   Tenderness: There is no abdominal tenderness.  ?Musculoskeletal:     ?   General: Normal range of motion.  ?   Cervical back: Normal range of motion and neck supple.  ?   Right lower leg: No tenderness. No edema.  ?   Left lower leg: No tenderness. No edema.  ?Skin: ?   General: Skin is warm and dry.  ?   Capillary Refill: Capillary refill takes less than 2 seconds.  ?Neurological:  ?   General: No focal deficit present.  ?   Mental Status: She is alert and oriented to person, place, and time. Mental status is at baseline.  ?Psychiatric:     ?   Mood and Affect: Mood normal.     ?   Behavior: Behavior normal.     ?   Thought Content: Thought content normal.     ?   Judgment: Judgment normal.  ? ? ?ED Results / Procedures / Treatments   ?Labs ?(all labs ordered are listed, but only abnormal results are displayed) ?Labs Reviewed  ?URINALYSIS, ROUTINE W REFLEX MICROSCOPIC - Abnormal; Notable for the following components:  ?    Result Value  ? Hgb urine dipstick SMALL (*)   ? Leukocytes,Ua SMALL (*)   ? Bacteria, UA RARE (*)   ? All other components within normal limits  ?BASIC METABOLIC PANEL - Abnormal; Notable for the following components:  ? Calcium 8.5 (*)   ? All other components within normal limits  ?CBC WITH DIFFERENTIAL/PLATELET - Abnormal; Notable for the following components:  ? WBC 18.9 (*)   ? RBC 3.62 (*)   ? Hemoglobin 11.5 (*)   ? MCV 101.7 (*)   ? Neutro Abs 15.2 (*)   ? Abs Immature Granulocytes 0.09 (*)   ? All other components within normal limits  ?RESP PANEL BY RT-PCR (FLU  A&B, COVID) [id number]  ?BASIC METABOLIC PANEL  ?CBC WITH DIFFERENTIAL/PLATELET  ?TROPONIN I (HIGH SENSITIVITY)  ?TROPONIN I (HIGH SENSITIVITY)  ?TROPONIN I (HIGH SENSITIVITY)  ? ?EKG ?None ? ?Radiology ?DG Chest 2 View ? ?Result Date: 03/22/2022 ?CLINICAL DATA:  Shortness of breath, fever, headache. EXAM: CHEST - 2 VIEW COMPARISON:  None. FINDINGS: Heart size is upper normal. Streaky/platelike opacities within the LEFT  mid lung and at the LEFT lung base, likely atelectasis. Lungs otherwise clear. No pleural effusion or pneumothorax is seen. No acute-appearing osseous abnormality. IMPRESSION: Probable mild atelectasis within the LEFT lung. Lungs otherwise clear. Electronically Signed   By: Bary RichardStan  Maynard M.D.   On: 03/22/2022 14:41   ? ?Procedures ?Procedures  ? ?Medications Ordered in ED ?Medications  ?azithromycin (ZITHROMAX) 500 mg in sodium chloride 0.9 % 250 mL IVPB (has no administration in time range)  ?ondansetron (ZOFRAN) injection 4 mg (4 mg Intravenous Given 03/22/22 1731)  ?acetaminophen (TYLENOL) tablet 1,000 mg (1,000 mg Oral Given 03/22/22 1553)  ?ipratropium-albuterol (DUONEB) 0.5-2.5 (3) MG/3ML nebulizer solution 3 mL (3 mLs Nebulization Given 03/22/22 1653)  ?sodium chloride 0.9 % bolus 1,000 mL (1,000 mLs Intravenous New Bag/Given 03/22/22 1734)  ?cefTRIAXone (ROCEPHIN) 2 g in sodium chloride 0.9 % 100 mL IVPB ( Intravenous Restarted 03/22/22 1735)  ? ? ?ED Course/ Medical Decision Making/ A&P ?  ?                        ?Medical Decision Making ?Amount and/or Complexity of Data Reviewed ?Labs: ordered. ?Radiology: ordered. ? ?Risk ?OTC drugs. ?Prescription drug management. ?Decision regarding hospitalization. ? ?This patient presents to the ED for concern of cough, this involves an extensive number of treatment options, and is a complaint that carries with it a high risk of complications and morbidity.  The differential diagnosis includes viral upper respiratory infection, allergies, pneumonia, PE, COPD  exacerbation, etc. ? ?Co morbidities that complicate the patient evaluation ?COPD ? ?Additional history obtained:  ?Additional history obtained from: Husband at bedside ?External records from outside source obtained and revie

## 2022-03-22 NOTE — ED Triage Notes (Signed)
Patient with complaints fever, headache, and shortness of breath that started this morning.  ?

## 2022-03-22 NOTE — Assessment & Plan Note (Signed)
-   Patient was febrile in the ED with a temperature of 100.4 ?-Tmax at home 101.6 ?-Leukocytosis ?-Cough and dyspnea ?-No pneumonia on chest x-ray ?-Procalcitonin pending ?-Treat with Zithromax and Rocephin ?-Urine strep and Legionella antigens ?-Culture expectorated sputum ?-Continue to monitor ?

## 2022-03-22 NOTE — Assessment & Plan Note (Addendum)
Continue duonebs ?Continue pulmicort ?Start IV solumedrol>>d/c home with prednisone 50 mg daily x 3 more days ?

## 2022-03-22 NOTE — H&P (Signed)
?History and Physical  ? ? ?Patient: Norma Anthony WPY:[id number] DOB: Aug 24, 1956 ?DOA: 03/22/2022 ?DOS: the patient was seen and examined on 03/22/2022 ?PCP: [facility]  ?Patient coming from: Home ? ?Chief Complaint:  ?Chief Complaint  ?Patient presents with  ? Shortness of Breath  ? ?HPI: Norma Anthony is a 66 y.o. female with medical history significant of with history of COPD, GERD, blood clot, depression, anxiety, and more presents ED with a chief complaint of dyspnea.  Patient does report that she has had 2 blood clots.  One was a PE a couple of decades ago, the other was a DVT in 2020.  DVT was in her right lower extremity.  Her right lower extremity risk needs swollen compared to her left.  She is on Xarelto and reports compliance with this medication.  Patient presents today with shortness of breath.  She reports she is always short of breath and today was no worse than normal.  What was different about today was that her pulse ox was reading 65%.  She used her as needed nasal cannula and brought her pulse ox up to the 80s.  She could not get it to sustain at the 90s.  She had a nasal cannula set at 2 L.  She reports cough and fever as well.  It all started at 4 AM this morning.  She does report that she has not been compliant with her BiPAP secondary to claustrophobia.  When she coughs, she does not see what comes up but reports that it tastes metallic.  She has chest pain that is associated with her cough.  She describes it as a dull pain in her mid chest.  It is not there when she is not coughing.  Patient reports dizziness at baseline, onset not associated with her hypoxia today.  She also reports that she has been having pruritus and constipation.  Its been several days since a bowel movement.  Last bowel movement is unknown.  Her appetite has been good.  Patient reports that about a week ago she did have blood in her undergarments.  She is not sure if it is vaginal, rectal,  urinary.  Sounds like her PCP was convinced it was urinary, did a UA, and started her on Cipro.  She did finish 5 days of Cipro.  She reports she continues to have right flank pain, as 2 to 3 days.  It happens every time she voids.  Since she has been on IV fluids she reports that the pain has resolved.  Her UA was negative in the ER. ? ?Patient does not smoke, does not drink alcohol, does not use illicit drugs.  She is vaccinated for COVID.  Patient is full code. ?Review of Systems: As mentioned in the history of present illness. All other systems reviewed and are negative. ?Past Medical History:  ?Diagnosis Date  ? COPD (chronic obstructive pulmonary disease) (HCC)   ? ?Past Surgical History:  ?Procedure Laterality Date  ? ABDOMINAL HYSTERECTOMY    ? HERNIA REPAIR    ? TONSILLECTOMY    ? ?Social History:  reports that she has never smoked. She has never used smokeless tobacco. She reports that she does not currently use alcohol. She reports that she does not currently use drugs. ? ?Allergies  ?Allergen Reactions  ? Pregabalin Other (See Comments)  ?  Other reaction(s): Paralysis present (finding) ?Other reaction(s): not tolerated ?Pt stated she could not move her body but she could think  ?  Pt stated she could not move her body but she could think  ?  ? Droperidol Anxiety  ?  Other reaction(s): anxitey ?  ? Ergotamine-Caffeine Anxiety  ?  Other reaction(s): Increased systolic arterial pressure (finding) ?  ? Prochlorperazine Anxiety and Other (See Comments)  ?  Other reaction(s): anxiety ?  ? ? ?Family History  ?Family history unknown: Yes  ? ? ?Prior to Admission medications   ?Medication Sig Start Date End Date Taking? Authorizing Provider  ?albuterol (VENTOLIN HFA) 108 (90 Base) MCG/ACT inhaler Inhale into the lungs. 05/12/21  Yes [provider]  ?ALPRAZolam Prudy Feeler) 1 MG tablet Take 1 mg by mouth at bedtime.   Yes [provider]  ?aspirin 81 MG EC tablet Take by mouth.   Yes [provider]  ?buprenorphine (BUTRANS) 15 MCG/HR 1 patch once a week. 02/25/22  Yes [provider]  ?buPROPion (WELLBUTRIN SR) 150 MG 12 hr tablet Take 150 mg by mouth daily. 04/09/20  Yes [provider]  ?Cholecalciferol (VITAMIN D3) 1.25 MG (50000 UT) CAPS Take 1 capsule by mouth once a week. 02/16/22  Yes [provider]  ?clindamycin-benzoyl peroxide (BENZACLIN) gel Apply topically. 03/10/22  Yes [provider]  ?clotrimazole-betamethasone (LOTRISONE) cream Apply topically 2 (two) times daily. 03/09/22  Yes [provider]  ?diphenhydrAMINE (BENADRYL) 25 mg capsule Take by mouth.   Yes [provider]  ?DULoxetine (CYMBALTA) 60 MG capsule Take 60 mg by mouth daily. 09/14/18  Yes [provider]  ?Ferrous Fumarate-Folic Acid 324-1 MG TABS Take 1 tablet by mouth daily. 02/29/20  Yes [provider]  ?gabapentin (NEURONTIN) 300 MG capsule Take 300 mg by mouth as needed (pain). 02/25/20  Yes [provider]  ?meclizine (ANTIVERT) 25 MG tablet Take 25 mg by mouth every 8 (eight) hours as needed. 11/09/21  Yes [provider]  ?naloxone Jonelle Sports) nasal spray 4 mg/0.1 mL SMARTSIG:Both Nares 10/16/21  Yes [provider]  ?pantoprazole (PROTONIX) 40 MG tablet Take 40 mg by mouth daily. 06/30/16  Yes [provider]  ?promethazine (PHENERGAN) 25 MG tablet Take 25 mg by mouth every 6 (six) hours as needed. 03/03/22  Yes [provider]  ?XARELTO 20 MG TABS tablet Take 20 mg by mouth daily. 08/21/21  Yes [provider]  ?predniSONE (STERAPRED UNI-PAK 21 TAB) 10 MG (21) TBPK tablet Take by mouth daily. Take 6 tabs by mouth daily  for 2 days, then 5 tabs for 2 days, then 4 tabs for 2 days, then 3 tabs for 2 days, 2 tabs for 2 days, then 1 tab by mouth daily for 2 days ?Patient not taking: Reported on 03/22/2022 08/25/21   Sudie Grumbling, NP  ? ? ?Physical Exam: ?Vitals:  ? 03/22/22 1858 03/22/22 1900 03/22/22  1916 03/22/22 2100  ?BP:  103/63  112/69  ?Pulse:  86  98  ?Resp:    (!) 22  ?Temp: 98.6 ?F (37 ?C)   98.6 ?F (37 ?C)  ?TempSrc:    Oral  ?SpO2: 98% 93% 93% 98%  ?Weight:    107.1 kg  ?Height:    5' 5.98" (1.676 m)  ? ?1.  General: ?Patient lying supine in bed,  no acute distress ?  ?2. Psychiatric: ?Alert and oriented x 3, mood and behavior normal for situation, pleasant and cooperative with exam ?  ?3. Neurologic: ?Speech and language are normal, face is symmetric, moves all 4 extremities voluntarily, at baseline without acute deficits on limited  exam ?  ?4. HEENMT:  ?Head is atraumatic, normocephalic, pupils reactive to light, neck is supple, trachea is midline, mucous membranes are moist ?  ?5. Respiratory : ?Lungs are clear to auscultation bilaterally without wheezing, rhonchi, rales, no cyanosis, no increase in work of breathing or accessory muscle use ?  ?6. Cardiovascular : ?Heart rate normal, rhythm is regular, no murmurs, rubs or gallops,  peripheral pulses palpated ?  ?7. Gastrointestinal:  ?Abdomen is soft, nondistended, nontender to palpation bowel sounds active, no masses or organomegaly palpated ?  ?8. Skin:  ?Skin is warm, dry and intact without rashes, acute lesions, or ulcers on limited exam ?  ?9.Musculoskeletal:  ?No acute deformities or trauma, right lower extremity is edematous compared to left, peripheral pulses palpated, no tenderness to palpation in the extremities ? ?Data Reviewed: ? ?In the ED ?Temp 100.4, heart rate 85-100, respiratory rate 17-20, blood pressure 117/86, satting at 90% on room air at rest, and 94% with 2 L nasal cannula ?White blood cell count 19, hemoglobin 11.5, platelets 174 ?Chemistry panel unremarkable ?UA negative ?Chest x-ray shows mild atelectasis in the left lung ?EKG shows a heart rate 90, sinus rhythm, QTc 468 ?Patient was given Tylenol for the fever, she was given DuoNeb, Zofran, 1 L normal saline, Zithromax, and Rocephin ?UA was negative ?Troponin was  normal ?Admission requested for acute on chronic respiratory failure with hypoxia ? ? ?Assessment and Plan: ?* Acute on chronic respiratory failure with hypoxia (HCC) ?- Patient does have oxygen as needed at home ?-She is se

## 2022-03-22 NOTE — ED Notes (Signed)
Picked up Patient for respiratory consult. Neb given patient decreased breath sounds. On 2 liters. ?

## 2022-03-22 NOTE — Assessment & Plan Note (Signed)
-   Patient does have oxygen as needed at home ?-She is set to 2 L at home today, did not feel improvement in her shortness of breath ?-Oxygen sats maintaining on 2 L nasal cannula ?-Oxygen sats down to 80% with ambulation in the ED ?-Oxygen sats reported down to 65% on pulse ox at home ?-Patient has not been compliant with her nightly BiPAP at home ?-Restart nightly BiPAP ?-Treat pneumonia as above ?-Treat COPD exacerbation ?Continue to monitor ?

## 2022-03-22 NOTE — Assessment & Plan Note (Addendum)
-  troponins 3>>14 ?-personally reviewed EKG--sinus, no STT changes ?-compliant with xarelto ?resolved ?

## 2022-03-23 ENCOUNTER — Observation Stay (HOSPITAL_COMMUNITY): Payer: Medicare Other

## 2022-03-23 DIAGNOSIS — J440 Chronic obstructive pulmonary disease with acute lower respiratory infection: Secondary | ICD-10-CM | POA: Diagnosis present

## 2022-03-23 DIAGNOSIS — R911 Solitary pulmonary nodule: Secondary | ICD-10-CM | POA: Diagnosis present

## 2022-03-23 DIAGNOSIS — F32A Depression, unspecified: Secondary | ICD-10-CM | POA: Diagnosis present

## 2022-03-23 DIAGNOSIS — Z79899 Other long term (current) drug therapy: Secondary | ICD-10-CM | POA: Diagnosis not present

## 2022-03-23 DIAGNOSIS — Z86711 Personal history of pulmonary embolism: Secondary | ICD-10-CM | POA: Diagnosis not present

## 2022-03-23 DIAGNOSIS — J441 Chronic obstructive pulmonary disease with (acute) exacerbation: Secondary | ICD-10-CM | POA: Diagnosis present

## 2022-03-23 DIAGNOSIS — J9601 Acute respiratory failure with hypoxia: Secondary | ICD-10-CM | POA: Diagnosis not present

## 2022-03-23 DIAGNOSIS — F419 Anxiety disorder, unspecified: Secondary | ICD-10-CM | POA: Diagnosis present

## 2022-03-23 DIAGNOSIS — F1120 Opioid dependence, uncomplicated: Secondary | ICD-10-CM

## 2022-03-23 DIAGNOSIS — J9811 Atelectasis: Secondary | ICD-10-CM | POA: Diagnosis present

## 2022-03-23 DIAGNOSIS — E041 Nontoxic single thyroid nodule: Secondary | ICD-10-CM | POA: Diagnosis present

## 2022-03-23 DIAGNOSIS — K5900 Constipation, unspecified: Secondary | ICD-10-CM | POA: Diagnosis present

## 2022-03-23 DIAGNOSIS — Z7901 Long term (current) use of anticoagulants: Secondary | ICD-10-CM | POA: Diagnosis not present

## 2022-03-23 DIAGNOSIS — G4733 Obstructive sleep apnea (adult) (pediatric): Secondary | ICD-10-CM | POA: Diagnosis present

## 2022-03-23 DIAGNOSIS — J181 Lobar pneumonia, unspecified organism: Secondary | ICD-10-CM | POA: Diagnosis present

## 2022-03-23 DIAGNOSIS — J9621 Acute and chronic respiratory failure with hypoxia: Secondary | ICD-10-CM | POA: Diagnosis present

## 2022-03-23 DIAGNOSIS — M797 Fibromyalgia: Secondary | ICD-10-CM | POA: Diagnosis present

## 2022-03-23 DIAGNOSIS — Z86718 Personal history of other venous thrombosis and embolism: Secondary | ICD-10-CM | POA: Diagnosis not present

## 2022-03-23 DIAGNOSIS — Z91199 Patient's noncompliance with other medical treatment and regimen due to unspecified reason: Secondary | ICD-10-CM | POA: Diagnosis not present

## 2022-03-23 DIAGNOSIS — Z888 Allergy status to other drugs, medicaments and biological substances status: Secondary | ICD-10-CM | POA: Diagnosis not present

## 2022-03-23 DIAGNOSIS — K219 Gastro-esophageal reflux disease without esophagitis: Secondary | ICD-10-CM | POA: Diagnosis present

## 2022-03-23 DIAGNOSIS — Z20822 Contact with and (suspected) exposure to covid-19: Secondary | ICD-10-CM | POA: Diagnosis present

## 2022-03-23 DIAGNOSIS — Z7982 Long term (current) use of aspirin: Secondary | ICD-10-CM | POA: Diagnosis not present

## 2022-03-23 HISTORY — DX: Opioid dependence, uncomplicated: F11.20

## 2022-03-23 HISTORY — DX: Acute respiratory failure with hypoxia: J96.01

## 2022-03-23 LAB — CBC WITH DIFFERENTIAL/PLATELET
Abs Immature Granulocytes: 0.04 10*3/uL (ref 0.00–0.07)
Basophils Absolute: 0 10*3/uL (ref 0.0–0.1)
Basophils Relative: 0 %
Eosinophils Absolute: 0.3 10*3/uL (ref 0.0–0.5)
Eosinophils Relative: 3 %
HCT: 34.4 % — ABNORMAL LOW (ref 36.0–46.0)
Hemoglobin: 10.5 g/dL — ABNORMAL LOW (ref 12.0–15.0)
Immature Granulocytes: 0 %
Lymphocytes Relative: 24 %
Lymphs Abs: 2.2 10*3/uL (ref 0.7–4.0)
MCH: 31.6 pg (ref 26.0–34.0)
MCHC: 30.5 g/dL (ref 30.0–36.0)
MCV: 103.6 fL — ABNORMAL HIGH (ref 80.0–100.0)
Monocytes Absolute: 0.5 10*3/uL (ref 0.1–1.0)
Monocytes Relative: 5 %
Neutro Abs: 6.2 10*3/uL (ref 1.7–7.7)
Neutrophils Relative %: 68 %
Platelets: 146 10*3/uL — ABNORMAL LOW (ref 150–400)
RBC: 3.32 MIL/uL — ABNORMAL LOW (ref 3.87–5.11)
RDW: 14.5 % (ref 11.5–15.5)
WBC: 9.2 10*3/uL (ref 4.0–10.5)
nRBC: 0 % (ref 0.0–0.2)

## 2022-03-23 LAB — COMPREHENSIVE METABOLIC PANEL
ALT: 18 U/L (ref 0–44)
AST: 19 U/L (ref 15–41)
Albumin: 3 g/dL — ABNORMAL LOW (ref 3.5–5.0)
Alkaline Phosphatase: 54 U/L (ref 38–126)
Anion gap: 4 — ABNORMAL LOW (ref 5–15)
BUN: 12 mg/dL (ref 8–23)
CO2: 30 mmol/L (ref 22–32)
Calcium: 8.2 mg/dL — ABNORMAL LOW (ref 8.9–10.3)
Chloride: 111 mmol/L (ref 98–111)
Creatinine, Ser: 0.7 mg/dL (ref 0.44–1.00)
GFR, Estimated: >60 mL/min (ref >60)
Glucose, Bld: 148 mg/dL — ABNORMAL HIGH (ref 70–99)
Potassium: 3.9 mmol/L (ref 3.5–5.1)
Sodium: 145 mmol/L (ref 135–145)
Total Bilirubin: 0.3 mg/dL (ref 0.3–1.2)
Total Protein: 6.1 g/dL — ABNORMAL LOW (ref 6.5–8.1)

## 2022-03-23 LAB — MAGNESIUM: Magnesium: 2.2 mg/dL (ref 1.7–2.4)

## 2022-03-23 LAB — STREP PNEUMONIAE URINARY ANTIGEN: Strep Pneumo Urinary Antigen: NEGATIVE

## 2022-03-23 LAB — HIV ANTIBODY (ROUTINE TESTING W REFLEX): HIV Screen 4th Generation wRfx: NONREACTIVE

## 2022-03-23 MED ORDER — BUDESONIDE 0.5 MG/2ML IN SUSP
0.5000 mg | Freq: Two times a day (BID) | RESPIRATORY_TRACT | Status: DC
Start: 2022-03-23 — End: 2022-03-24
  Administered 2022-03-23 – 2022-03-24 (×2): 0.5 mg via RESPIRATORY_TRACT
  Filled 2022-03-23 (×2): qty 2

## 2022-03-23 MED ORDER — METHYLPREDNISOLONE SODIUM SUCC 40 MG IJ SOLR
40.0000 mg | Freq: Two times a day (BID) | INTRAMUSCULAR | Status: DC
Start: 2022-03-23 — End: 2022-03-24
  Administered 2022-03-23 – 2022-03-24 (×2): 40 mg via INTRAVENOUS
  Filled 2022-03-23 (×2): qty 1

## 2022-03-23 MED ORDER — ALPRAZOLAM 1 MG PO TABS
1.0000 mg | ORAL_TABLET | Freq: Three times a day (TID) | ORAL | Status: DC | PRN
  Administered 2022-03-24: 1 mg via ORAL
  Filled 2022-03-23: qty 1

## 2022-03-23 MED ORDER — METHYLPREDNISOLONE SODIUM SUCC 40 MG IJ SOLR: 40.0000 mg | Freq: Two times a day (BID) | INTRAMUSCULAR | Status: DC

## 2022-03-23 NOTE — Assessment & Plan Note (Signed)
Continue Xarelto 

## 2022-03-23 NOTE — Progress Notes (Signed)
?  ?       ?PROGRESS NOTE ? ?Norma Anthony [id number] DOB: 28-Sep-1956 DOA: 03/22/2022 ?[facility]P: [facility] ? ?Brief History:  ?66 year old female with a history of COPD, chronic respiratory failure on 2 L, fibromyalgia, PE, DVT right lower extremity, and OSA (noncompliant with CPAP) presenting with 1 day history of shortness of breath, coughing, and fevers up to 101.6 ?F.  She states that she quit smoking in March 2022 after about a 20-pack-year history.  She states that she is supposed to be on 2 L nasal cannula at home, but does not use it because she feels that her breathing has been fine.  However, she states that she checked her oxygen saturation on 03/22/2022 because she was short of breath and noted it was 69% on room air.  Therefore, she placed her cell phone her usual 2 L nasal cannula.  She continued to be short of breath.  As result she came to the emergency department for further evaluation.  She denies any nausea, vomiting, diarrhea, abdominal pain, dysuria, hematuria.  She states that about a week ago she noted some blood in her underwear.  Her PCP thought this was secondary to UTI and prescribed her ciprofloxacin.  She denies any frank hematochezia or melena. ?In the ED, the patient had a temperature of 100.4 ?F.  She was hemodynamically stable with oxygen saturation 99% on 2 L.  WBC 18.9, hemoglobin 11.5, platelets 74,000.  Sodium 141, potassium 3.8, bicarbonate 28, serum creatinine 0.68. Chest x-ray showed bibasilar atelectasis, left greater than right.  PCT 0.17.  Troponin 3>> 14.  UA 0-5 WBC, 0-5 RBC.  Patient was started on ceftriaxone and azithromycin.  ? ? ? ?Assessment and Plan: ?* Acute respiratory failure with hypoxia (HCC) ?Secondary to COPD exacerbation ?Patient is supposed to be on 2 L nasal cannula, but does not use it because she does not feel she needs it ?Currently stable on 2 L nasal cannula ?Wean oxygen for saturation greater 92% ?Personally reviewed CXR--bibasilar  atelectasis L>R ?Obtain CT chest ? ?COPD with acute exacerbation ([place]) ?Continue duonebs ?Continue pulmicort ?Start IV solumedrol ? ?History of pulmonary embolus (PE) ?Continue Xarelto ? ?Opioid dependence ([place]) ?PDMP reviewed ?--pt receives buprenorphine patch 46mcg/hr monthly ?--last refilled 02/25/22 ? ?OSA (obstructive sleep apnea) ?Noncompliant with CPAP at home ? ?Fibromyalgia ?Continue buproprion and duloxetine ?PDMP reviewed ?-pt receives alprazolam 1mg , #120, monthly ?-last filled 03/14/22 ? ?GERD (gastroesophageal reflux disease) ?Continue Protonix ? ?Chest pain ?-troponins 3>>4 ?-personally reviewed EKG--sinus, no STT changes ?-compliant with xarelto ?-obtain echo ? ? ? ? ? ? ?Family Communication:  no Family at bedside ? ?Consultants:  none ? ?Code Status:  FULL  ? ?DVT Prophylaxis:  xarelto ? ? ?Procedures: ?As Listed in Progress Note Above ? ?Antibiotics: ?Ceftriaxone 4/9>> ?Azithro 6/9>> ? ? ? ? ? ?Subjective: ?Patient states she is breathing better.  Still has nonproductive cough.  Denies cp, n/v/d, abd pain.  No dysuria ? ?Objective: ?Vitals:  ? 03/23/22 0027 03/23/22 0058 03/23/22 0451 03/23/22 0802  ?BP:  (!) 100/54 109/61   ?Pulse: 85 83 74   ?Resp: 20 20 (!) 21   ?Temp:  97.6 ?F (36.4 ?C) 97.8 ?F (36.6 ?C)   ?TempSrc:  Axillary Oral   ?SpO2: 95% 92% 99% 99%  ?Weight:      ?Height:      ? ? ?Intake/Output Summary (Last 24 hours) at 03/23/2022 0853 ?Last data filed at 03/22/2022 1830 ?Gross per 24 hour  ?Intake 1100  ml  ?Output --  ?Net 1100 ml  ? ?Weight change:  ?Exam: ? ?General:  Pt is alert, follows commands appropriately, not in acute distress ?HEENT: No icterus, No thrush, No neck mass, [place]/AT ?Cardiovascular: RRR, S1/S2, no rubs, no gallops ?Respiratory: bibasilar rales. No wheeze ?Abdomen: Soft/+BS, non tender, non distended, no guarding ?Extremities: No edema, No lymphangitis, No petechiae, No rashes, no synovitis ? ? ?Data Reviewed: ?I have personally reviewed following labs and imaging  studies ?Basic Metabolic Panel: ?Recent Labs  ?Lab 03/22/22 ?1506 03/22/22 ?1705 03/23/22 ?[id number]  ?NA PATIENT IDENTIFICATION ERROR. PLEASE DISREGARD RESULTS. ACCOUNT WILL BE CREDITED. 141 145  ?K PATIENT IDENTIFICATION ERROR. PLEASE DISREGARD RESULTS. ACCOUNT WILL BE CREDITED. 3.8 3.9  ?CL PATIENT IDENTIFICATION ERROR. PLEASE DISREGARD RESULTS. ACCOUNT WILL BE CREDITED. 106 111  ?CO2 PATIENT IDENTIFICATION ERROR. PLEASE DISREGARD RESULTS. ACCOUNT WILL BE CREDITED. 28 30  ?GLUCOSE PATIENT IDENTIFICATION ERROR. PLEASE DISREGARD RESULTS. ACCOUNT WILL BE CREDITED. 98 148*  ?BUN PATIENT IDENTIFICATION ERROR. PLEASE DISREGARD RESULTS. ACCOUNT WILL BE CREDITED. 14 12  ?CREATININE PATIENT IDENTIFICATION ERROR. PLEASE DISREGARD RESULTS. ACCOUNT WILL BE CREDITED. 0.68 0.70  ?CALCIUM PATIENT IDENTIFICATION ERROR. PLEASE DISREGARD RESULTS. ACCOUNT WILL BE CREDITED. 8.5* 8.2*  ?MG  --   --  2.2  ? ?Liver Function Tests: ?Recent Labs  ?Lab 03/23/22 ?[id number]  ?AST 19  ?ALT 18  ?ALKPHOS 54  ?BILITOT 0.3  ?PROT 6.1*  ?ALBUMIN 3.0*  ? ?No results for input(s): LIPASE, AMYLASE in the last 168 hours. ?No results for input(s): AMMONIA in the last 168 hours. ?Coagulation Profile: ?No results for input(s): INR, PROTIME in the last 168 hours. ?CBC: ?Recent Labs  ?Lab 03/22/22 ?1506 03/22/22 ?1705 03/23/22 ?[id number]  ?WBC PATIENT IDENTIFICATION ERROR. PLEASE DISREGARD RESULTS. ACCOUNT WILL BE CREDITED. 18.9* 9.2  ?NEUTROABS PATIENT IDENTIFICATION ERROR. PLEASE DISREGARD RESULTS. ACCOUNT WILL BE CREDITED. 15.2* 6.2  ?HGB PATIENT IDENTIFICATION ERROR. PLEASE DISREGARD RESULTS. ACCOUNT WILL BE CREDITED. 11.5* 10.5*  ?HCT PATIENT IDENTIFICATION ERROR. PLEASE DISREGARD RESULTS. ACCOUNT WILL BE CREDITED. 36.8 34.4*  ?MCV PATIENT IDENTIFICATION ERROR. PLEASE DISREGARD RESULTS. ACCOUNT WILL BE CREDITED. 101.7* 103.6*  ?PLT PATIENT IDENTIFICATION ERROR. PLEASE DISREGARD RESULTS. ACCOUNT WILL BE CREDITED. 174 146*  ? ?Cardiac Enzymes: ?No results for  input(s): CKTOTAL, CKMB, CKMBINDEX, TROPONINI in the last 168 hours. ?BNP: ?Invalid input(s): POCBNP ?CBG: ?No results for input(s): GLUCAP in the last 168 hours. ?HbA1C: ?No results for input(s): HGBA1C in the last 72 hours. ?Urine analysis: ?   ?Component Value Date/Time  ? COLORURINE YELLOW 03/22/2022 1604  ? APPEARANCEUR CLEAR 03/22/2022 1604  ? LABSPEC 1.012 03/22/2022 1604  ? PHURINE 5.0 03/22/2022 1604  ? GLUCOSEU NEGATIVE 03/22/2022 1604  ? HGBUR SMALL (A) 03/22/2022 1604  ? [place] NEGATIVE 03/22/2022 1604  ? [place] NEGATIVE 03/22/2022 1604  ? PROTEINUR NEGATIVE 03/22/2022 1604  ? NITRITE NEGATIVE 03/22/2022 1604  ? LEUKOCYTESUR SMALL (A) 03/22/2022 1604  ? ?Sepsis Labs: ?@[email or web address] (procalcitonin:4,lacticidven:4) ?) ?Recent Results (from the past 240 hour(s))  ?Resp Panel by RT-PCR (Flu A&B, Covid) Nasopharyngeal Swab     Status: None  ? Collection Time: 03/22/22  2:21 PM  ? Specimen: Nasopharyngeal Swab; Nasopharyngeal(NP) swabs in vial transport medium  ?Result Value Ref Range Status  ? SARS Coronavirus 2 by RT PCR NEGATIVE NEGATIVE Final  ?  Comment: (NOTE) ?SARS-CoV-2 target nucleic acids are NOT DETECTED. ? ?The SARS-CoV-2 RNA is generally detectable in upper respiratory ?specimens during the acute phase of infection. The lowest ?concentration of SARS-CoV-2 viral copies this assay can detect  is ?138 copies/mL. A negative result does not preclude SARS-Cov-2 ?infection and should not be used as the sole basis for treatment or ?other patient management decisions. A negative result may occur with  ?improper specimen collection/handling, submission of specimen other ?than nasopharyngeal swab, presence of viral mutation(s) within the ?areas targeted by this assay, and inadequate number of viral ?copies(<138 copies/mL). A negative result must be combined with ?clinical observations, patient history, and epidemiological ?information. The expected result is Negative. ? ?Fact Sheet for Patients:   ?EntrepreneurPulse.com.au ? ?Fact Sheet for Healthcare Providers:  ?IncredibleEmployment.be ? ?This test is no t yet approved or cleared by the [place] and  ?has been authorized fo

## 2022-03-23 NOTE — Assessment & Plan Note (Addendum)
Noncompliant with CPAP at home ?I have requested referral to [facility] after d/c ?

## 2022-03-23 NOTE — Assessment & Plan Note (Addendum)
Secondary to COPD exacerbation ?Patient is supposed to be on 2 L nasal cannula, but does not use it because she does not feel she needs it ?Currently stable on 2 L nasal cannula>>weaned to RA ?Wean oxygen for saturation greater 92% ?Personally reviewed CXR--bibasilar atelectasis L>R ?4/10 CT chest-Left greater than right patchy ground-glass opacities, likely ?infectious or inflammatory; Solid pulmonary nodule of the left upper lobe measuring 3 mm  ?Remained stable on RA with activity ?

## 2022-03-23 NOTE — Assessment & Plan Note (Signed)
PDMP reviewed ?--pt receives buprenorphine patch 38mcg/hr monthly ?--last refilled 02/25/22 ?

## 2022-03-23 NOTE — Plan of Care (Signed)
?  Problem: Education: ?Goal: Knowledge of disease or condition will improve ?Outcome: Progressing ?  ?Problem: Education: ?Goal: Knowledge of the prescribed therapeutic regimen will improve ?Outcome: Progressing ?  ?Problem: Respiratory: ?Goal: Ability to maintain a clear airway will improve ?Outcome: Progressing ?  ?Problem: Respiratory: ?Goal: Levels of oxygenation will improve ?Outcome: Progressing ?  ?Problem: Respiratory: ?Goal: Ability to maintain adequate ventilation will improve ?Outcome: Progressing ?  ?

## 2022-03-23 NOTE — Progress Notes (Signed)
?  Transition of Care (TOC) Screening Note ? ? ?Patient Details  ?Name: Norma Anthony ?Date of Birth: 1956-08-15 ? ? ?Transition of Care (TOC) CM/SW Contact:    ?Villa Herb, LCSWA ?Phone Number: ?03/23/2022, 12:01 PM ? ? ? ?Transition of Care Department [facility]) has reviewed patient and no TOC needs have been identified at this time. We will continue to monitor patient advancement through interdisciplinary progression rounds. If new patient transition needs arise, please place a TOC consult. ?  ?

## 2022-03-23 NOTE — Evaluation (Signed)
Physical Therapy Evaluation ?Patient Details ?Name: Norma Anthony ?MRN: [id number] ?DOB: 1956/03/31 ?Today's Date: 03/23/2022 ? ?History of Present Illness ? Norma Anthony is a 66 y.o. female with medical history significant of with history of COPD, GERD, blood clot, depression, anxiety, and more presents ED with a chief complaint of dyspnea.  Patient does report that she has had 2 blood clots.  One was a PE a couple of decades ago, the other was a DVT in 2020.  DVT was in her right lower extremity.  Her right lower extremity risk needs swollen compared to her left.  She is on Xarelto and reports compliance with this medication.  Patient presents today with shortness of breath.  She reports she is always short of breath and today was no worse than normal.  What was different about today was that her pulse ox was reading 65%.  She used her as needed nasal cannula and brought her pulse ox up to the 80s.  She could not get it to sustain at the 90s.  She had a nasal cannula set at 2 L.  She reports cough and fever as well.  It all started at 4 AM this morning.  She does report that she has not been compliant with her BiPAP secondary to claustrophobia.  When she coughs, she does not see what comes up but reports that it tastes metallic.  She has chest pain that is associated with her cough.  She describes it as a dull pain in her mid chest.  It is not there when she is not coughing.  Patient reports dizziness at baseline, onset not associated with her hypoxia today.  She also reports that she has been having pruritus and constipation.  Its been several days since a bowel movement.  Last bowel movement is unknown.  Her appetite has been good.  Patient reports that about a week ago she did have blood in her undergarments.  She is not sure if it is vaginal, rectal, urinary.  Sounds like her PCP was convinced it was urinary, did a UA, and started her on Cipro.  She did finish 5 days of Cipro.  She reports she  continues to have right flank pain, as 2 to 3 days.  It happens every time she voids.  Since she has been on IV fluids she reports that the pain has resolved.  Her UA was negative in the ER. ?  ?Clinical Impression ? Patient demonstrates good return for bed mobility, sit to stands and transferring to/from commode in bathroom, initially antalgic gain on RLE due to knee pain that mostly resolved after taking steps in room and able to ambulate in hallway without requiring use of AD, no loss of balance and on room air during ambulation with SpO2 dropping from 95% to 87% and put back on 2 LPM.  Patient encouraged to ambulate ad lib in room and with family/nursing staff as tolerated.  Plan:  Patient discharged from physical therapy to care of nursing for ambulation daily as tolerated for length of stay.    ?   ? ?Recommendations for follow up therapy are one component of a multi-disciplinary discharge planning process, led by the attending physician.  Recommendations may be updated based on patient status, additional functional criteria and insurance authorization. ? ?Follow Up Recommendations No PT follow up ? ?  ?Assistance Recommended at Discharge PRN  ?Patient can return home with the following ? Help with stairs or ramp for entrance ? ?  ?  Equipment Recommendations None recommended by PT  ?Recommendations for Other Services ?    ?  ?Functional Status Assessment Patient has not had a recent decline in their functional status  ? ?  ?Precautions / Restrictions Precautions ?Precautions: None ?Restrictions ?Weight Bearing Restrictions: No  ? ?  ? ?Mobility ? Bed Mobility ?Overal bed mobility: Modified Independent ?  ?  ?  ?  ?  ?  ?  ?  ? ?Transfers ?Overall transfer level: Modified independent ?  ?  ?  ?  ?  ?  ?  ?  ?  ?  ? ?Ambulation/Gait ?Ambulation/Gait assistance: Modified independent (Device/Increase time), Supervision ?Gait Distance (Feet): 100 Feet ?Assistive device: None ?Gait Pattern/deviations: Decreased step  length - right, Decreased step length - left, Decreased stride length, Antalgic ?Gait velocity: decreased ?  ?  ?General Gait Details: slightly labored cadence with mild antalgic gait on RLE due to knee pain when weightbearing that decreased after taking a few steps with good return for ambulation in room and hallway without loss of balance ? ?Stairs ?  ?  ?  ?  ?  ? ?Wheelchair Mobility ?  ? ?Modified Rankin (Stroke Patients Only) ?  ? ?  ? ?Balance Overall balance assessment: Mild deficits observed, not formally tested ?  ?  ?  ?  ?  ?  ?  ?  ?  ?  ?  ?  ?  ?  ?  ?  ?  ?  ?   ? ? ? ?Pertinent Vitals/Pain Pain Assessment ?Pain Assessment: 0-10 ?Pain Score: 7  ?Pain Location: mostly low back, some in head and legs, right knee ?Pain Descriptors / Indicators: Sore, Aching ?Pain Intervention(s): Limited activity within patient's tolerance, Monitored during session, Repositioned  ? ? ?Home Living Family/patient expects to be discharged to:: Private residence ?Living Arrangements: Spouse/significant other ?Available Help at Discharge: Family;Available 24 hours/day ?Type of Home: House ?Home Access: Stairs to enter ?Entrance Stairs-Rails: None ?Entrance Stairs-Number of Steps: 1 ?  ?Home Layout: One level ?Home Equipment: Agricultural consultant (2 wheels);Cane - single point ?   ?  ?Prior Function Prior Level of Function : Independent/Modified Independent ?  ?  ?  ?  ?  ?  ?Mobility Comments: Tourist information centre manager, drives ?ADLs Comments: Independent ?  ? ? ?Hand Dominance  ?   ? ?  ?Extremity/Trunk Assessment  ? Upper Extremity Assessment ?Upper Extremity Assessment: Overall WFL for tasks assessed ?  ? ?Lower Extremity Assessment ?Lower Extremity Assessment: Overall WFL for tasks assessed ?  ? ?Cervical / Trunk Assessment ?Cervical / Trunk Assessment: Normal  ?Communication  ? Communication: No difficulties  ?Cognition Arousal/Alertness: Awake/alert ?Behavior During Therapy: [facility] for tasks assessed/performed ?Overall Cognitive  Status: Within Functional Limits for tasks assessed ?  ?  ?  ?  ?  ?  ?  ?  ?  ?  ?  ?  ?  ?  ?  ?  ?  ?  ?  ? ?  ?General Comments   ? ?  ?Exercises    ? ?Assessment/Plan  ?  ?PT Assessment Patient does not need any further PT services  ?PT Problem List   ? ?   ?  ?PT Treatment Interventions     ? ?PT Goals (Current goals can be found in the Care Plan section)  ?Acute Rehab PT Goals ?Patient Stated Goal: return home with family to assist ?PT Goal Formulation: With patient/family ?Time For Goal Achievement: 03/23/22 ?Potential to  Achieve Goals: Good ? ?  ?Frequency   ?  ? ? ?Co-evaluation   ?  ?  ?  ?  ? ? ?  ?AM-PAC PT "6 Clicks" Mobility  ?Outcome Measure Help needed turning from your back to your side while in a flat bed without using bedrails?: None ?Help needed moving from lying on your back to sitting on the side of a flat bed without using bedrails?: None ?Help needed moving to and from a bed to a chair (including a wheelchair)?: None ?Help needed standing up from a chair using your arms (e.g., wheelchair or bedside chair)?: None ?Help needed to walk in hospital room?: None ?Help needed climbing 3-5 steps with a railing? : A Little ?6 Click Score: 23 ? ?  ?End of Session Equipment Utilized During Treatment: Oxygen ?Activity Tolerance: Patient tolerated treatment well;Patient limited by fatigue ?Patient left: in bed;with call bell/phone within reach;with family/visitor present ?Nurse Communication: Mobility status ?PT Visit Diagnosis: Unsteadiness on feet (R26.81);Other abnormalities of gait and mobility (R26.89);Muscle weakness (generalized) (M62.81) ?  ? ?Time: 8657-84691013-1038 ?PT Time Calculation (min) (ACUTE ONLY): 25 min ? ? ?Charges:   PT Evaluation ?$PT Eval Moderate Complexity: 1 Mod ?PT Treatments ?$Therapeutic Activity: 23-37 mins ?  ?   ? ? ?12:26 PM, 03/23/22 ?Ocie BobJames Tarig Zimmers, MPT ?Physical Therapist with [place] ?[facility] ?[phone] office ?[phone] mobile phone ? ? ?

## 2022-03-23 NOTE — Assessment & Plan Note (Addendum)
Continue buproprion and duloxetine ?PDMP reviewed ?-pt receives alprazolam 1mg , #120, monthly ?-last filled 03/14/22 ?

## 2022-03-23 NOTE — Hospital Course (Signed)
66 year old female with a history of COPD, chronic respiratory failure on 2 L, fibromyalgia, PE, DVT right lower extremity, and OSA (noncompliant with CPAP) presenting with 1 day history of shortness of breath, coughing, and fevers up to 101.6 ?F.  She states that she quit smoking in March 2022 after about a 20-pack-year history.  She states that she is supposed to be on 2 L nasal cannula at home, but does not use it because she feels that her breathing has been fine.  However, she states that she checked her oxygen saturation on 03/22/2022 because she was short of breath and noted it was 69% on room air.  Therefore, she placed her cell phone her usual 2 L nasal cannula.  She continued to be short of breath.  As result she came to the emergency department for further evaluation.  She denies any nausea, vomiting, diarrhea, abdominal pain, dysuria, hematuria.  She states that about a week ago she noted some blood in her underwear.  Her PCP thought this was secondary to UTI and prescribed her ciprofloxacin.  She denies any frank hematochezia or melena. ?In the ED, the patient had a temperature of 100.4 ?F.  She was hemodynamically stable with oxygen saturation 99% on 2 L.  WBC 18.9, hemoglobin 11.5, platelets 74,000.  Sodium 141, potassium 3.8, bicarbonate 28, serum creatinine 0.68. Chest x-ray showed bibasilar atelectasis, left greater than right.  PCT 0.17.  Troponin 3>> 14.  UA 0-5 WBC, 0-5 RBC.  Patient was started on ceftriaxone and azithromycin. ?

## 2022-03-24 ENCOUNTER — Telehealth: Payer: Self-pay | Admitting: "Endocrinology

## 2022-03-24 DIAGNOSIS — J181 Lobar pneumonia, unspecified organism: Secondary | ICD-10-CM

## 2022-03-24 DIAGNOSIS — R911 Solitary pulmonary nodule: Secondary | ICD-10-CM

## 2022-03-24 DIAGNOSIS — E041 Nontoxic single thyroid nodule: Secondary | ICD-10-CM

## 2022-03-24 HISTORY — DX: Solitary pulmonary nodule: R91.1

## 2022-03-24 LAB — URINE CULTURE

## 2022-03-24 LAB — LEGIONELLA PNEUMOPHILA SEROGP 1 UR AG: L. pneumophila Serogp 1 Ur Ag: NEGATIVE

## 2022-03-24 MED ORDER — CEFDINIR 300 MG PO CAPS: 300.0000 mg | ORAL_CAPSULE | Freq: Two times a day (BID) | ORAL | Status: DC

## 2022-03-24 MED ORDER — IPRATROPIUM-ALBUTEROL 0.5-2.5 (3) MG/3ML IN SOLN: 3.0000 mL | Freq: Two times a day (BID) | RESPIRATORY_TRACT | Status: DC

## 2022-03-24 MED ORDER — CEFDINIR 300 MG PO CAPS: 300.0000 mg | ORAL_CAPSULE | Freq: Two times a day (BID) | ORAL | 0 refills | Status: DC

## 2022-03-24 MED ORDER — PREDNISONE 20 MG PO TABS
50.0000 mg | ORAL_TABLET | Freq: Every day | ORAL | Status: DC
Start: 2022-03-25 — End: 2022-03-24

## 2022-03-24 MED ORDER — AZITHROMYCIN 250 MG PO TABS: 500.0000 mg | ORAL_TABLET | Freq: Every day | ORAL | Status: DC

## 2022-03-24 MED ORDER — PREDNISONE 50 MG PO TABS: 50.0000 mg | ORAL_TABLET | Freq: Every day | ORAL | 0 refills | Status: DC

## 2022-03-24 MED ORDER — AZITHROMYCIN 500 MG PO TABS: 500.0000 mg | ORAL_TABLET | Freq: Every day | ORAL | 0 refills | Status: DC

## 2022-03-24 NOTE — Assessment & Plan Note (Signed)
Incidental finding on CT chest ?Outpatient surveillance ?

## 2022-03-24 NOTE — Progress Notes (Signed)
Patient was sleeping with bipap on then called the front desk. This nurse went into the patients room the patient had taken the bipap off and was anxious. Patient then stated " I understand the machine is to help me breath better but I can not sleep with the noise." Put patient on the nasal canula 2L. Patient then states "I normally stay up during the night, I don't think I need the machine." This nurse listened to patient talk about home as she calmed down and was breathing normal. Will continue to monitor patient.  ?

## 2022-03-24 NOTE — Plan of Care (Signed)
°  Problem: Education: °Goal: Knowledge of disease or condition will improve °Outcome: Adequate for Discharge °Goal: Knowledge of the prescribed therapeutic regimen will improve °Outcome: Adequate for Discharge °Goal: Individualized Educational Video(s) °Outcome: Adequate for Discharge °  °Problem: Activity: °Goal: Ability to tolerate increased activity will improve °Outcome: Adequate for Discharge °Goal: Will verbalize the importance of balancing activity with adequate rest periods °Outcome: Adequate for Discharge °  °Problem: Respiratory: °Goal: Ability to maintain a clear airway will improve °Outcome: Adequate for Discharge °Goal: Levels of oxygenation will improve °Outcome: Adequate for Discharge °Goal: Ability to maintain adequate ventilation will improve °Outcome: Adequate for Discharge °  °Problem: Activity: °Goal: Ability to tolerate increased activity will improve °Outcome: Adequate for Discharge °  °Problem: Clinical Measurements: °Goal: Ability to maintain a body temperature in the normal range will improve °Outcome: Adequate for Discharge °  °Problem: Respiratory: °Goal: Ability to maintain adequate ventilation will improve °Outcome: Adequate for Discharge °Goal: Ability to maintain a clear airway will improve °Outcome: Adequate for Discharge °  °Problem: Education: °Goal: Knowledge of General Education information will improve °Description: Including pain rating scale, medication(s)/side effects and non-pharmacologic comfort measures °Outcome: Adequate for Discharge °  °Problem: Health Behavior/Discharge Planning: °Goal: Ability to manage health-related needs will improve °Outcome: Adequate for Discharge °  °Problem: Clinical Measurements: °Goal: Ability to maintain clinical measurements within normal limits will improve °Outcome: Adequate for Discharge °Goal: Will remain free from infection °Outcome: Adequate for Discharge °Goal: Diagnostic test results will improve °Outcome: Adequate for  Discharge °Goal: Respiratory complications will improve °Outcome: Adequate for Discharge °Goal: Cardiovascular complication will be avoided °Outcome: Adequate for Discharge °  °Problem: Activity: °Goal: Risk for activity intolerance will decrease °Outcome: Adequate for Discharge °  °Problem: Nutrition: °Goal: Adequate nutrition will be maintained °Outcome: Adequate for Discharge °  °Problem: Coping: °Goal: Level of anxiety will decrease °Outcome: Adequate for Discharge °  °Problem: Elimination: °Goal: Will not experience complications related to bowel motility °Outcome: Adequate for Discharge °Goal: Will not experience complications related to urinary retention °Outcome: Adequate for Discharge °  °Problem: Pain Managment: °Goal: General experience of comfort will improve °Outcome: Adequate for Discharge °  °Problem: Safety: °Goal: Ability to remain free from injury will improve °Outcome: Adequate for Discharge °  °Problem: Skin Integrity: °Goal: Risk for impaired skin integrity will decrease °Outcome: Adequate for Discharge °  °

## 2022-03-24 NOTE — Assessment & Plan Note (Addendum)
Incidental finding on CT chest;  ?-outpatient surveillance, follow up, thyroid US ?-I have requested a outpt appt with endocrine, Dr. Fransico Him ?

## 2022-03-24 NOTE — Assessment & Plan Note (Signed)
Received ceftriaxone and azithro during hospitalization ?D/c home with po cefdinir and azithro x 4 more days ?4/10 CT chest with Left greater than right patchy ground-glass opacities ?

## 2022-03-24 NOTE — Telephone Encounter (Signed)
Called and left pt a VM to call and set up a new patient appointment with Dr Fransico Him. He received a message from Dr Tat at [facility] in regards to a thyroid nodule they found in her CT. She is still currently admitted. ?

## 2022-03-24 NOTE — Progress Notes (Signed)
Went in to give patient breathing treatment.  Patient had came off Bipap machine and was back on [place].  She stated that she did not like the mask, it made her claustrophobic.  She stated she would like to try a different mask but was possibly going home today.  I informed patient that if she stayed in hospital another night, I would see about getting her a different mask; patient stated she uses nasal pillows at home, but did confess to not using very much at home either. ?

## 2022-03-24 NOTE — Discharge Summary (Addendum)
?Physician Discharge Summary ?  ?Patient: Norma Anthony MRN: [id number] DOB: 20-Aug-1956  ?Admit date:     03/22/2022  ?Discharge date: 03/24/22  ?Discharge Physician: Shanon Brow Tristan Bramble  ? ?PCP: [facility] [place]  ? ?Recommendations at discharge:  ? Please follow up with primary care provider within 1-2 weeks ? Please repeat BMP and CBC in one week ? Please follow up on/with thyroid nodule and lung nodule surveillance ? ? ? ? ?Hospital Course: ?66 year old female with a history of COPD, chronic respiratory failure on 2 L, fibromyalgia, PE, DVT right lower extremity, and OSA (noncompliant with CPAP) presenting with 1 day history of shortness of breath, coughing, and fevers up to 101.6 ?F.  She states that she quit smoking in March 2022 after about a 20-pack-year history.  She states that she is supposed to be on 2 L nasal cannula at home, but does not use it because she feels that her breathing has been fine.  However, she states that she checked her oxygen saturation on 03/22/2022 because she was short of breath and noted it was 69% on room air.  Therefore, she placed her cell phone her usual 2 L nasal cannula.  She continued to be short of breath.  As result she came to the emergency department for further evaluation.  She denies any nausea, vomiting, diarrhea, abdominal pain, dysuria, hematuria.  She states that about a week ago she noted some blood in her underwear.  Her PCP thought this was secondary to UTI and prescribed her ciprofloxacin.  She denies any frank hematochezia or melena. ?In the ED, the patient had a temperature of 100.4 ?F.  She was hemodynamically stable with oxygen saturation 99% on 2 L.  WBC 18.9, hemoglobin 11.5, platelets 74,000.  Sodium 141, potassium 3.8, bicarbonate 28, serum creatinine 0.68. Chest x-ray showed bibasilar atelectasis, left greater than right.  PCT 0.17.  Troponin 3>> 14.  UA 0-5 WBC, 0-5 RBC.  Patient was started on ceftriaxone and azithromycin. ? ?Assessment and Plan: ?*  Acute respiratory failure with hypoxia ([place]) ?Secondary to COPD exacerbation ?Patient is supposed to be on 2 L nasal cannula, but does not use it because she does not feel she needs it ?Currently stable on 2 L nasal cannula>>weaned to RA ?Wean oxygen for saturation greater 92% ?Personally reviewed CXR--bibasilar atelectasis L>R ?4/10 CT chest-Left greater than right patchy ground-glass opacities, likely ?infectious or inflammatory; Solid pulmonary nodule of the left upper lobe measuring 3 mm  ?Remained stable on RA with activity ? ?COPD with acute exacerbation ([place]) ?Continue duonebs ?Continue pulmicort ?Start IV solumedrol>>d/c home with prednisone 50 mg daily x 3 more days ? ?Lobar pneumonia ([place]) ?Received ceftriaxone and azithro during hospitalization ?D/c home with po cefdinir and azithro x 4 more days ?4/10 CT chest with Left greater than right patchy ground-glass opacities ? ?History of pulmonary embolus (PE) ?Continue Xarelto ? ?Opioid dependence ([place]) ?PDMP reviewed ?--pt receives buprenorphine patch 36mg/hr monthly ?--last refilled 02/25/22 ? ?Thyroid nodule ?Incidental finding on CT chest;  ?-outpatient surveillance, follow up, thyroid [place]?-I have requested a outpt appt with endocrine, Dr. NDorris Fetch? ?Lung nodule ?Incidental finding on CT chest ?Outpatient surveillance ? ?OSA (obstructive sleep apnea) ?Noncompliant with CPAP at home ?[place] ? ?Fibromyalgia ?Continue buproprion and duloxetine ?PDMP reviewed ?-pt receives alprazolam 132m #120, monthly ?-last filled 03/14/22 ? ?GERD (gastroesophageal reflux disease) ?Continue Protonix ? ?Chest pain ?-troponins 3>>14 ?-personally reviewed EKG--sinus, no STT changes ?-compliant with  xarelto ?resolved ? ? ? ? ?  ? ? ?Consultants: none ?Procedures performed: none  ?Disposition: Home ?Diet recommendation:  ?Cardiac diet ?DISCHARGE MEDICATION: ?Allergies as of 03/24/2022   ? ?   Reactions  ? Pregabalin Other (See  Comments)  ? Other reaction(s): Paralysis present (finding) ?Other reaction(s): not tolerated ?Pt stated she could not move her body but she could think  ?Pt stated she could not move her body but she could think   ? Droperidol Anxiety  ? Other reaction(s): anxitey  ? Ergotamine-caffeine Anxiety  ? Other reaction(s): Increased systolic arterial pressure (finding)  ? Prochlorperazine Anxiety, Other (See Comments)  ? Other reaction(s): anxiety  ? ?  ? ?  ?Medication List  ?  ? ?STOP taking these medications   ? ?predniSONE 10 MG (21) Tbpk tablet ?Commonly known as: STERAPRED UNI-PAK 21 TAB ?Replaced by: predniSONE 50 MG tablet ?  ? ?  ? ?TAKE these medications   ? ?albuterol 108 (90 Base) MCG/ACT inhaler ?Commonly known as: VENTOLIN HFA ?Inhale into the lungs. ?  ?ALPRAZolam 1 MG tablet ?Commonly known as: Duanne Moron ?Take 1 mg by mouth at bedtime. ?  ?aspirin 81 MG EC tablet ?Take by mouth. ?  ?azithromycin 500 MG tablet ?Commonly known as: ZITHROMAX ?Take 1 tablet (500 mg total) by mouth daily. ?  ?buprenorphine 15 MCG/HR ?Commonly known as: BUTRANS ?1 patch once a week. ?  ?buPROPion 150 MG 12 hr tablet ?Commonly known as: WELLBUTRIN SR ?Take 150 mg by mouth daily. ?  ?cefdinir 300 MG capsule ?Commonly known as: OMNICEF ?Take 1 capsule (300 mg total) by mouth every 12 (twelve) hours. ?Start taking on: March 25, 2022 ?  ?clindamycin-benzoyl peroxide gel ?Commonly known as: BENZACLIN ?Apply topically. ?  ?clotrimazole-betamethasone cream ?Commonly known as: LOTRISONE ?Apply topically 2 (two) times daily. ?  ?diphenhydrAMINE 25 mg capsule ?Commonly known as: BENADRYL ?Take by mouth. ?  ?DULoxetine 60 MG capsule ?Commonly known as: CYMBALTA ?Take 60 mg by mouth daily. ?  ?Ferrous Fumarate-Folic Acid 161-0 MG Tabs ?Take 1 tablet by mouth daily. ?  ?gabapentin 300 MG capsule ?Commonly known as: NEURONTIN ?Take 300 mg by mouth as needed (pain). ?  ?meclizine 25 MG tablet ?Commonly known as: ANTIVERT ?Take 25 mg by mouth every  8 (eight) hours as needed. ?  ?naloxone 4 MG/0.1ML Liqd nasal spray kit ?Commonly known as: NARCAN ?SMARTSIG:Both Nares ?  ?pantoprazole 40 MG tablet ?Commonly known as: PROTONIX ?Take 40 mg by mouth daily. ?  ?predniSONE 50 MG tablet ?Commonly known as: DELTASONE ?Take 1 tablet (50 mg total) by mouth daily with breakfast. ?Start taking on: March 25, 2022 ?Replaces: predniSONE 10 MG (21) Tbpk tablet ?  ?promethazine 25 MG tablet ?Commonly known as: PHENERGAN ?Take 25 mg by mouth every 6 (six) hours as needed. ?  ?Vitamin D3 1.25 MG (50000 UT) Caps ?Take 1 capsule by mouth once a week. ?  ?Xarelto 20 MG Tabs tablet ?Generic drug: rivaroxaban ?Take 20 mg by mouth daily. ?  ? ?  ? ? Follow-up Information   ? ? Chesley Mires, MD Follow up in 1 month(s).   ?Specialty: Pulmonary Disease ?Contact information: ?[address] ?[address] ?[place] [address] ?[phone] ? ? ?  ?  ? ? Rigoberto Noel, MD Follow up in 1 month(s).   ?Specialty: Pulmonary Disease ?Contact information: ?[address] ?[address] ?[place] [address] ?[phone] ? ? ?  ?  ? ?  ?  ? ?  ? ?  Discharge Exam: ?Filed Weights  ? 03/22/22 1340 03/22/22 2100  ?Weight: 105.2 kg 107.1 kg  ? ?HEENT:  [place]/AT, No thrush, no icterus ?CV:  RRR, no rub, no S3, no S4 ?Lung: bibasilar rales. No wheeze ?Abd:  soft/+BS, NT ?Ext:  No edema, no lymphangitis, no synovitis, no rash ? ? ?Condition at discharge: stable ? ?The results of significant diagnostics from this hospitalization (including imaging, microbiology, ancillary and laboratory) are listed below for reference.  ? ?Imaging Studies: ?DG Chest 2 View ? ?Result Date: 03/22/2022 ?CLINICAL DATA:  Shortness of breath, fever, headache. EXAM: CHEST - 2 VIEW COMPARISON:  None. FINDINGS: Heart size is upper normal. Streaky/platelike opacities within the LEFT mid lung and at the LEFT lung base, likely atelectasis. Lungs otherwise clear. No pleural effusion or pneumothorax is seen. No acute-appearing osseous  abnormality. IMPRESSION: Probable mild atelectasis within the LEFT lung. Lungs otherwise clear. Electronically Signed   By: Franki Cabot M.D.   On: 03/22/2022 14:41  ? ?CT CHEST WO CONTRAST ? ?Result Date: 03/23/2022

## 2022-03-26 ENCOUNTER — Telehealth: Payer: Self-pay

## 2022-03-26 NOTE — Telephone Encounter (Signed)
ATC patient. LMTCB. Patient needs a sleep consult appt with Dr. Vassie Loll or Dr. Craige Cotta in [facility] office when she returns call.  ?

## 2022-03-26 NOTE — Telephone Encounter (Signed)
-----   Message from Oretha Milch, MD sent at 03/25/2022  9:06 AM EDT ----- ?Regarding: RE: outpatient appointment ?Mohannad Olivero, ?Please make new patient appointment for this patient as sleep consult, next available okay with the VS/me ? ?RA ? ?----- Message ----- ?From: [name], MD ?Sent: 03/24/2022   7:03 PM EDT ?To: Oretha Milch, MD ?Subject: RE: outpatient appointment                    ? ?I spoke with her already about that.  She definitely wants to switch to you guys. ? ?Thank you, ?Onalee Hua ?----- Message ----- ?From: Oretha Milch, MD ?Sent: 03/24/2022   4:02 PM EDT ?To: [name], MD, [name], MD, # ?Subject: RE: outpatient appointment                    ? ?She is already seeing a neurology provider for this.  Please confirm that she would like to switch to [place] ? ?Kathy Breach ?----- Message ----- ?From: [name], MD ?Sent: 03/24/2022   9:46 AM EDT ?To: [name], MD, Oretha Milch, MD ?Subject: outpatient appointment                        ? ?Can you please have your staff schedule this patient an outpatient appointment for sleep apnea? ? ?Thank you, ? ?Onalee Hua ? ? ? ?

## 2022-03-27 NOTE — Telephone Encounter (Signed)
ATC patient.  LMTCB. 

## 2022-03-31 NOTE — Telephone Encounter (Signed)
Patient declined when I called and said her PCP was handling this ?

## 2022-04-01 NOTE — Telephone Encounter (Signed)
Patient called back about an appt but was going to check with [employer] to make sure a referral wasn't needed and then call back to schedule.  Will need appt with Dr. Craige Cotta or Dr. Vassie Loll.  First available 6/8 or 6/9 and the week of 6/12.  ta ?

## 2022-04-19 ENCOUNTER — Emergency Department (HOSPITAL_COMMUNITY): Payer: Medicare Other

## 2022-04-19 ENCOUNTER — Emergency Department (HOSPITAL_COMMUNITY)
Admission: EM | Admit: 2022-04-19 | Discharge: 2022-04-19 | Disposition: A | Discharge status: Home / Self Care | Payer: Medicare Other | Attending: Emergency Medicine | Admitting: Emergency Medicine

## 2022-04-19 ENCOUNTER — Encounter (HOSPITAL_COMMUNITY): Payer: Self-pay | Admitting: Emergency Medicine

## 2022-04-19 ENCOUNTER — Other Ambulatory Visit: Payer: Self-pay

## 2022-04-19 DIAGNOSIS — N939 Abnormal uterine and vaginal bleeding, unspecified: Secondary | ICD-10-CM | POA: Insufficient documentation

## 2022-04-19 DIAGNOSIS — R051 Acute cough: Secondary | ICD-10-CM | POA: Insufficient documentation

## 2022-04-19 DIAGNOSIS — R109 Unspecified abdominal pain: Secondary | ICD-10-CM | POA: Diagnosis not present

## 2022-04-19 DIAGNOSIS — R319 Hematuria, unspecified: Secondary | ICD-10-CM | POA: Diagnosis not present

## 2022-04-19 DIAGNOSIS — Z7951 Long term (current) use of inhaled steroids: Secondary | ICD-10-CM | POA: Diagnosis not present

## 2022-04-19 DIAGNOSIS — R059 Cough, unspecified: Secondary | ICD-10-CM | POA: Diagnosis present

## 2022-04-19 DIAGNOSIS — Z7901 Long term (current) use of anticoagulants: Secondary | ICD-10-CM | POA: Insufficient documentation

## 2022-04-19 DIAGNOSIS — Z7982 Long term (current) use of aspirin: Secondary | ICD-10-CM | POA: Diagnosis not present

## 2022-04-19 DIAGNOSIS — J449 Chronic obstructive pulmonary disease, unspecified: Secondary | ICD-10-CM | POA: Insufficient documentation

## 2022-04-19 DIAGNOSIS — R0981 Nasal congestion: Secondary | ICD-10-CM | POA: Insufficient documentation

## 2022-04-19 DIAGNOSIS — R0789 Other chest pain: Secondary | ICD-10-CM | POA: Diagnosis not present

## 2022-04-19 LAB — BASIC METABOLIC PANEL
Anion gap: 4 — ABNORMAL LOW (ref 5–15)
BUN: 16 mg/dL (ref 8–23)
CO2: 32 mmol/L (ref 22–32)
Calcium: 8.6 mg/dL — ABNORMAL LOW (ref 8.9–10.3)
Chloride: 107 mmol/L (ref 98–111)
Creatinine, Ser: 0.74 mg/dL (ref 0.44–1.00)
GFR, Estimated: >60 mL/min (ref >60)
Glucose, Bld: 92 mg/dL (ref 70–99)
Potassium: 4.2 mmol/L (ref 3.5–5.1)
Sodium: 143 mmol/L (ref 135–145)

## 2022-04-19 LAB — CBC WITH DIFFERENTIAL/PLATELET
Abs Immature Granulocytes: 0.01 10*3/uL (ref 0.00–0.07)
Basophils Absolute: 0 10*3/uL (ref 0.0–0.1)
Basophils Relative: 1 %
Eosinophils Absolute: 0.3 10*3/uL (ref 0.0–0.5)
Eosinophils Relative: 5 %
HCT: 37.5 % (ref 36.0–46.0)
Hemoglobin: 11.7 g/dL — ABNORMAL LOW (ref 12.0–15.0)
Immature Granulocytes: 0 %
Lymphocytes Relative: 34 %
Lymphs Abs: 2.2 10*3/uL (ref 0.7–4.0)
MCH: 32 pg (ref 26.0–34.0)
MCHC: 31.2 g/dL (ref 30.0–36.0)
MCV: 102.5 fL — ABNORMAL HIGH (ref 80.0–100.0)
Monocytes Absolute: 0.5 10*3/uL (ref 0.1–1.0)
Monocytes Relative: 8 %
Neutro Abs: 3.3 10*3/uL (ref 1.7–7.7)
Neutrophils Relative %: 52 %
Platelets: 163 10*3/uL (ref 150–400)
RBC: 3.66 MIL/uL — ABNORMAL LOW (ref 3.87–5.11)
RDW: 14 % (ref 11.5–15.5)
WBC: 6.4 10*3/uL (ref 4.0–10.5)
nRBC: 0 % (ref 0.0–0.2)

## 2022-04-19 LAB — URINALYSIS, ROUTINE W REFLEX MICROSCOPIC
Bacteria, UA: NONE SEEN
Bilirubin Urine: NEGATIVE
Glucose, UA: NEGATIVE mg/dL
Ketones, ur: NEGATIVE mg/dL
Nitrite: NEGATIVE
Protein, ur: 30 mg/dL — AB
RBC / HPF: >50 RBC/hpf — ABNORMAL HIGH (ref 0–5)
Specific Gravity, Urine: 1.018 (ref 1.005–1.030)
pH: 5 (ref 5.0–8.0)

## 2022-04-19 LAB — BLOOD GAS, VENOUS
Acid-Base Excess: 6.3 mmol/L — ABNORMAL HIGH (ref 0.0–2.0)
Bicarbonate: 33.1 mmol/L — ABNORMAL HIGH (ref 20.0–28.0)
Drawn by: 1517
FIO2: 28 %
O2 Saturation: 65.3 %
Patient temperature: 36.8
pCO2, Ven: 56 mmHg (ref 44–60)
pH, Ven: 7.38 (ref 7.25–7.43)
pO2, Ven: 35 mmHg (ref 32–45)

## 2022-04-19 LAB — TROPONIN I (HIGH SENSITIVITY): Troponin I (High Sensitivity): 3 ng/L (ref <18)

## 2022-04-19 MED ORDER — BENZONATATE 200 MG PO CAPS: 200.0000 mg | ORAL_CAPSULE | Freq: Three times a day (TID) | ORAL | 0 refills | Status: DC | PRN

## 2022-04-19 MED ORDER — BENZONATATE 100 MG PO CAPS
200.0000 mg | ORAL_CAPSULE | Freq: Once | ORAL | Status: AC
  Administered 2022-04-19: 200 mg via ORAL
  Filled 2022-04-19: qty 2

## 2022-04-19 MED ORDER — KETOROLAC TROMETHAMINE 30 MG/ML IJ SOLN
15.0000 mg | Freq: Once | INTRAMUSCULAR | Status: AC
  Administered 2022-04-19: 15 mg via INTRAVENOUS
  Filled 2022-04-19: qty 1

## 2022-04-19 NOTE — ED Provider Notes (Signed)
?[facility] ?Provider Note ? ? ?CSN: [id number] ?Arrival date & time: 04/19/22  1250 ? ?  ? ?History ? ?Chief Complaint  ?Patient presents with  ? Hematuria  ? ? ?Norma Anthony is a 66 y.o. female. ? ? ?Hematuria ?Pertinent negatives include no abdominal pain, no headaches and no shortness of breath.  ? ?  ? ? ?Norma Anthony is a 66 y.o. female with past medical history significant for COPD, fibromyalgia, and prior pulmonary embolus, chronically anticoagulated on Xarelto.  Who presents to the Emergency Department complaining of cough, chest congestion and tightness x1 week.  She also notes having hematuria x2 weeks hematuria is intermittent and associated with urinary pressure and right-sided flank pain .  Endorses history of prior kidney stones.  States that she was admitted into the hospital 1 month ago with pneumonia and she is concerned her pneumonia may be returning.  She was seen by her PCP last week and started on cefpodoxime x3 days ago.  Here today because symptoms are not improving.  Denies any shortness of breath. ? ?Wears oxygen 2 L by nasal cannula on as needed basis.  She was not wearing oxygen when she arrived.  O2 sat 89% in triage and she was placed on 2 L O2 sats now in mid 90s. ? ? ? ? ?Home Medications ?Prior to Admission medications   ?Medication Sig Start Date End Date Taking? Authorizing Provider  ?albuterol (VENTOLIN HFA) 108 (90 Base) MCG/ACT inhaler Inhale into the lungs. 05/12/21  Yes [provider]  ?ALPRAZolam Prudy Feeler) 1 MG tablet Take 1 mg by mouth at bedtime.   Yes [provider]  ?aspirin 81 MG EC tablet Take by mouth.   Yes [provider]  ?Aspirin-Salicylamide-Caffeine (BC HEADACHE POWDER PO) Take by mouth.   Yes [provider]  ?buprenorphine (BUTRANS) 20 MCG/HR PTWK 1 patch once a week. 03/30/22  Yes [provider]  ?buPROPion (WELLBUTRIN SR) 150 MG 12 hr tablet Take 150 mg by mouth daily. 04/09/20  Yes  [provider]  ?Cholecalciferol (VITAMIN D3) 1.25 MG (50000 UT) CAPS Take 1 capsule by mouth once a week. 02/16/22  Yes [provider]  ?clotrimazole-betamethasone (LOTRISONE) cream Apply topically 2 (two) times daily. 03/09/22  Yes [provider]  ?diphenhydrAMINE (BENADRYL) 25 mg capsule Take by mouth.   Yes [provider]  ?DULoxetine (CYMBALTA) 60 MG capsule Take 60 mg by mouth daily. 09/14/18  Yes [provider]  ?Ferrous Fumarate-Folic Acid 324-1 MG TABS Take 1 tablet by mouth daily. 02/29/20  Yes [provider]  ?gabapentin (NEURONTIN) 300 MG capsule Take 300 mg by mouth as needed (pain). 02/25/20  Yes [provider]  ?meclizine (ANTIVERT) 25 MG tablet Take 25 mg by mouth every 8 (eight) hours as needed. 11/09/21  Yes [provider]  ?naloxone Jonelle Sports) nasal spray 4 mg/0.1 mL SMARTSIG:Both Nares 10/16/21  Yes [provider]  ?pantoprazole (PROTONIX) 40 MG tablet Take 40 mg by mouth daily. 06/30/16  Yes [provider]  ?promethazine (PHENERGAN) 25 MG tablet Take 25 mg by mouth every 6 (six) hours as needed. 03/03/22  Yes [provider]  ?XARELTO 20 MG TABS tablet Take 20 mg by mouth daily. 08/21/21  Yes [provider]  ?azithromycin (ZITHROMAX) 500 MG tablet Take 1 tablet (500 mg total) by mouth daily. ?Patient not taking: Reported on 04/19/2022 03/24/22   Norma Hartshorn, MD  ?cefdinir (OMNICEF) 300 MG capsule Take 1 capsule (300  mg total) by mouth every 12 (twelve) hours. ?Patient not taking: Reported on 04/19/2022 03/25/22   Norma Hartshorn, MD  ?predniSONE (DELTASONE) 50 MG tablet Take 1 tablet (50 mg total) by mouth daily with breakfast. ?Patient not taking: Reported on 04/19/2022 03/25/22   Norma Hartshorn, MD  ?   ? ?Allergies    ?Pregabalin, Droperidol, Ergotamine-caffeine, and Prochlorperazine   ? ?Review of Systems   ?Review of Systems  ?Constitutional:  Negative for chills and fever.  ?HENT:  Positive for  congestion.   ?Respiratory:  Positive for cough and chest tightness. Negative for shortness of breath and wheezing.   ?Gastrointestinal:  Negative for abdominal pain, diarrhea, nausea and vomiting.  ?Genitourinary:  Positive for dysuria, flank pain and hematuria.  ?Skin:  Negative for rash.  ?Neurological:  Negative for dizziness, weakness, numbness and headaches.  ? ?Physical Exam ?Updated Vital Signs ?BP (!) 124/95   Pulse 72   Temp 98.3 ?F (36.8 ?C) (Oral)   Resp 15   Ht 5\' 7"  (1.702 m)   Wt 105.2 kg   SpO2 99%   BMI 36.34 kg/m?  ?Physical Exam ?Vitals and nursing note reviewed.  ?Constitutional:   ?   General: She is not in acute distress. ?   Appearance: Normal appearance. She is not ill-appearing.  She is somnolent on exam, but will wake and answer questions appropriately. ?HENT:  ?   Mouth/Throat:  ?   Mouth: Mucous membranes are moist.  ?Cardiovascular:  ?   Rate and Rhythm: Normal rate and regular rhythm.  ?   Pulses: Normal pulses.  ?Pulmonary:  ?   Effort: Pulmonary effort is normal. No respiratory distress.  ?   Breath sounds: No wheezing or rales.  ?Abdominal:  ?   Palpations: Abdomen is soft.  ?   Tenderness: There is no abdominal tenderness. There is right CVA tenderness. There is no left CVA tenderness.  ?GU ?Pelvic exam performed with chaperone present.  No palpable adnexal masses.  There is dried blood to the external vaginal area.  Scant amount of blood seen with speculum exam but no obvious source of active bleeding.  Vaginal wall appear friable. ? ?Musculoskeletal:     ?   General: Normal range of motion.  ?   Right lower leg: No edema.  ?   Left lower leg: No edema.  ?Skin: ?   General: Skin is warm.  ?   Capillary Refill: Capillary refill takes less than 2 seconds.  ?   Findings: No rash.  Patient is wearing a Butrans pain patch ?Neurological:  ?   General: No focal deficit present.  ?   Mental Status: She is alert.  ?   Sensory: No sensory deficit.  ?   Motor: No weakness.  ? ? ?ED  Results / Procedures / Treatments   ?Labs ?(all labs ordered are listed, but only abnormal results are displayed) ?Labs Reviewed  ?CBC WITH DIFFERENTIAL/PLATELET - Abnormal; Notable for the following components:  ?    Result Value  ? RBC 3.66 (*)   ? Hemoglobin 11.7 (*)   ? MCV 102.5 (*)   ? All other components within normal limits  ?BASIC METABOLIC PANEL - Abnormal; Notable for the following components:  ? Calcium 8.6 (*)   ? Anion gap 4 (*)   ? All other components within normal limits  ?URINALYSIS, ROUTINE W REFLEX MICROSCOPIC - Abnormal; Notable for the following components:  ? APPearance CLOUDY (*)   ? Hgb urine dipstick  LARGE (*)   ? Protein, ur 30 (*)   ? Leukocytes,Ua TRACE (*)   ? RBC / HPF >50 (*)   ? All other components within normal limits  ?BLOOD GAS, VENOUS - Abnormal; Notable for the following components:  ? Bicarbonate 33.1 (*)   ? Acid-Base Excess 6.3 (*)   ? All other components within normal limits  ?URINE CULTURE  ?TROPONIN I (HIGH SENSITIVITY)  ? ? ?EKG ?None ?EKG shows sinus rhythm.  No acute ischemic change. ? ?Radiology ?DG Chest 2 View ? ?Result Date: 04/19/2022 ?CLINICAL DATA:  Cough and shortness of breath.  COPD. EXAM: CHEST - 2 VIEW COMPARISON:  03/22/2022 FINDINGS: The heart size and mediastinal contours are within normal limits. Mild scarring again seen in the left midlung. No evidence of acute infiltrate or pleural effusion. IMPRESSION: No active cardiopulmonary disease. Electronically Signed   By: Danae OrleansJohn A Stahl M.D.   On: 04/19/2022 15:44  ? ?CT Renal Stone Study ? ?Result Date: 04/19/2022 ?CLINICAL DATA:  Flank pain and hematuria for 2 weeks. Nephrolithiasis. EXAM: CT ABDOMEN AND PELVIS WITHOUT CONTRAST TECHNIQUE: Multidetector CT imaging of the abdomen and pelvis was performed following the standard protocol without IV contrast. RADIATION DOSE REDUCTION: This exam was performed according to the departmental dose-optimization program which includes automated exposure control,  adjustment of the mA and/or kV according to patient size and/or use of iterative reconstruction technique. COMPARISON:  None Available. FINDINGS: Lower chest: No acute findings. Hepatobiliary: No mass visualized on this [place]

## 2022-04-19 NOTE — ED Triage Notes (Addendum)
Patient c/o hematuria that started approx 2 weeks ago. Patient seen by PCP and given antibiotics for possible cystitis with no relief. Patient reports change in flow of urine and bladder pressure. Denies any fevers. Patient also c/o congested cough and headache x1 week that is progressively getting worse. Per patient hx of pneumonia in which she was admitted for x1 month ago.  ?

## 2022-04-19 NOTE — ED Triage Notes (Signed)
Patient O2 sat dropped to 89% in triage per patient wears O2 via Roland PRN. Patient not on O2 at this time.  ?

## 2022-04-19 NOTE — ED Notes (Signed)
Lab notified for [facility] ?

## 2022-04-19 NOTE — Discharge Instructions (Addendum)
Your work-up today was reassuring.  We did not find any evidence of pneumonia or urinary tract infection.  CT scan did not show evidence of a obstructing kidney stone.  The blood that you are seeing could possibly could be coming from your vagina.  I recommend that you continue your antibiotics as directed until they are finished.  Monitor your oxygen levels at home and use your oxygen at 2 L by nasal cannula if your oxygen level is below 90%.  You may follow-up with the gynecology group listed.  Also please follow-up with your primary care provider for recheck regarding your cough. ?

## 2022-04-19 NOTE — ED Notes (Signed)
Went over [employer]. All questions answered. Iv removed. Pt getting dressed and then will ambulate to lobby.  ?

## 2022-04-19 NOTE — ED Provider Notes (Incomplete)
?[facility] ?Provider Note ? ? ?CSN: [id number] ?Arrival date & time: 04/19/22  1250 ? ?  ? ?History ?{Add pertinent medical, surgical, social history, OB history to HPI:1} ?Chief Complaint  ?Patient presents with  ?? Hematuria  ? ? ?Norma Anthony is a 66 y.o. female. ? ? ?Hematuria ?Pertinent negatives include no abdominal pain, no headaches and no shortness of breath.  ? ?  ? ? ?Norma Anthony is a 66 y.o. female with past medical history significant for COPD, fibromyalgia, and prior pulmonary embolus, chronically anticoagulated on Xarelto.  Who presents to the Emergency Department complaining of cough, chest congestion and tightness x1 week.  She also notes having hematuria x2 weeks hematuria has been associated with urinary pressure and right-sided flank pain.  Endorses history of prior kidney stones.  States that she was admitted into the hospital 1 month ago with pneumonia and she is concerned her pneumonia may be returning.  She was seen by her PCP last week and started on cefpodoxime x3 days ago.  Here today because symptoms are not improving.  Denies any shortness of breath. ? ?Wears oxygen 2 L by nasal cannula on as needed basis.  She was not wearing oxygen when she arrived.  O2 sat 89% in triage and she was placed on 2 L O2 sats now in mid 90s. ? ? ? ? ?Home Medications ?Prior to Admission medications   ?Medication Sig Start Date End Date Taking? Authorizing Provider  ?albuterol (VENTOLIN HFA) 108 (90 Base) MCG/ACT inhaler Inhale into the lungs. 05/12/21   [provider]  ?ALPRAZolam Prudy Feeler(XANAX) 1 MG tablet Take 1 mg by mouth at bedtime.    [provider]  ?aspirin 81 MG EC tablet Take by mouth.    [provider]  ?azithromycin (ZITHROMAX) 500 MG tablet Take 1 tablet (500 mg total) by mouth daily. 03/24/22   Catarina Hartshornat, David, MD  ?buprenorphine Lavera Guise(BUTRANS) 15 MCG/HR 1 patch once a week. 02/25/22   [provider]  ?buPROPion (WELLBUTRIN SR) 150 MG 12  hr tablet Take 150 mg by mouth daily. 04/09/20   [provider]  ?cefdinir (OMNICEF) 300 MG capsule Take 1 capsule (300 mg total) by mouth every 12 (twelve) hours. 03/25/22   Catarina Hartshornat, David, MD  ?Cholecalciferol (VITAMIN D3) 1.25 MG (50000 UT) CAPS Take 1 capsule by mouth once a week. 02/16/22   [provider]  ?clindamycin-benzoyl peroxide (BENZACLIN) gel Apply topically. 03/10/22   [provider]  ?clotrimazole-betamethasone (LOTRISONE) cream Apply topically 2 (two) times daily. 03/09/22   [provider]  ?diphenhydrAMINE (BENADRYL) 25 mg capsule Take by mouth.    [provider]  ?DULoxetine (CYMBALTA) 60 MG capsule Take 60 mg by mouth daily. 09/14/18   [provider]  ?Ferrous Fumarate-Folic Acid 324-1 MG TABS Take 1 tablet by mouth daily. 02/29/20   [provider]  ?gabapentin (NEURONTIN) 300 MG capsule Take 300 mg by mouth as needed (pain). 02/25/20   [provider]  ?meclizine (ANTIVERT) 25 MG tablet Take 25 mg by mouth every 8 (eight) hours as needed. 11/09/21   [provider]  ?naloxone Jonelle Sports(NARCAN) nasal spray 4 mg/0.1 mL SMARTSIG:Both Nares 10/16/21   [provider]  ?pantoprazole (PROTONIX) 40 MG tablet Take 40 mg by mouth daily. 06/30/16   [provider]  ?predniSONE (DELTASONE) 50 MG tablet Take 1 tablet (50 mg total) by mouth daily with breakfast. 03/25/22   Tat, Onalee Huaavid, MD  ?promethazine (PHENERGAN) 25 MG tablet  Take 25 mg by mouth every 6 (six) hours as needed. 03/03/22   [provider]  ?XARELTO 20 MG TABS tablet Take 20 mg by mouth daily. 08/21/21   [provider]  ?   ? ?Allergies    ?Pregabalin, Droperidol, Ergotamine-caffeine, and Prochlorperazine   ? ?Review of Systems   ?Review of Systems  ?Constitutional:  Negative for chills and fever.  ?HENT:  Positive for congestion.   ?Respiratory:  Positive for cough and chest tightness. Negative for shortness of breath and wheezing.    ?Gastrointestinal:  Negative for abdominal pain, diarrhea, nausea and vomiting.  ?Genitourinary:  Positive for dysuria, flank pain and hematuria.  ?Skin:  Negative for rash.  ?Neurological:  Negative for dizziness, weakness, numbness and headaches.  ? ?Physical Exam ?Updated Vital Signs ?BP (!) 119/56 (BP Location: Right Arm)   Pulse 80   Temp 98.3 ?F (36.8 ?C) (Oral)   Resp 20   Ht 5\' 7"  (1.702 m)   Wt 105.2 kg   SpO2 90%   BMI 36.34 kg/m?  ?Physical Exam ?Vitals and nursing note reviewed.  ?Constitutional:   ?   General: She is not in acute distress. ?   Appearance: Normal appearance. She is not ill-appearing.  ?HENT:  ?   Mouth/Throat:  ?   Mouth: Mucous membranes are moist.  ?Cardiovascular:  ?   Rate and Rhythm: Normal rate and regular rhythm.  ?   Pulses: Normal pulses.  ?Pulmonary:  ?   Effort: Pulmonary effort is normal. No respiratory distress.  ?   Breath sounds: No wheezing or rales.  ?Abdominal:  ?   Palpations: Abdomen is soft.  ?   Tenderness: There is no abdominal tenderness. There is right CVA tenderness. There is no left CVA tenderness.  ?Musculoskeletal:     ?   General: Normal range of motion.  ?   Right lower leg: No edema.  ?   Left lower leg: No edema.  ?Skin: ?   General: Skin is warm.  ?   Capillary Refill: Capillary refill takes less than 2 seconds.  ?   Findings: No rash.  ?Neurological:  ?   General: No focal deficit present.  ?   Mental Status: She is alert.  ?   Sensory: No sensory deficit.  ?   Motor: No weakness.  ? ? ?ED Results / Procedures / Treatments   ?Labs ?(all labs ordered are listed, but only abnormal results are displayed) ?Labs Reviewed  ?CBC WITH DIFFERENTIAL/PLATELET  ?BASIC METABOLIC PANEL  ?URINALYSIS, ROUTINE W REFLEX MICROSCOPIC  ?TROPONIN I (HIGH SENSITIVITY)  ? ? ?EKG ?None ? ?Radiology ?No results found. ? ?Procedures ?Procedures  ?{Document cardiac monitor, telemetry assessment procedure when appropriate:1} ? ?Medications Ordered in ED ?Medications - No  data to display ? ?ED Course/ Medical Decision Making/ A&P ?  ?                        ?Medical Decision Making ?Amount and/or Complexity of Data Reviewed ?Labs: ordered. ?Radiology: ordered. ? ? ?*** ? ?{Document critical care time when appropriate:1} ?{Document review of labs and clinical decision tools ie heart score, Chads2Vasc2 etc:1}  ?{Document your independent review of radiology images, and any outside records:1} ?{Document your discussion with family members, caretakers, and with consultants:1} ?{Document social determinants of health affecting pt's care:1} ?{Document your decision making why or why not admission, treatments were needed:1} ?Final Clinical Impression(s) / ED Diagnoses ?Final diagnoses:  ?None  ? ? ?  Rx / DC Orders ?ED Discharge Orders   ? ? None  ? ?  ? ? ?

## 2022-04-21 LAB — URINE CULTURE: Culture: <10000 — AB

## 2022-04-22 NOTE — Telephone Encounter (Signed)
Patient scheduled by [facility] with next available appt. In RDS.  ?

## 2022-04-30 ENCOUNTER — Ambulatory Visit (INDEPENDENT_AMBULATORY_CARE_PROVIDER_SITE_OTHER): Payer: Medicare Other | Admitting: Internal Medicine

## 2022-04-30 ENCOUNTER — Encounter: Payer: Self-pay | Admitting: Internal Medicine

## 2022-04-30 ENCOUNTER — Telehealth: Payer: Self-pay

## 2022-04-30 DIAGNOSIS — Z87891 Personal history of nicotine dependence: Secondary | ICD-10-CM

## 2022-04-30 DIAGNOSIS — R058 Other specified cough: Secondary | ICD-10-CM | POA: Diagnosis not present

## 2022-04-30 DIAGNOSIS — R0609 Other forms of dyspnea: Secondary | ICD-10-CM | POA: Diagnosis not present

## 2022-04-30 DIAGNOSIS — E041 Nontoxic single thyroid nodule: Secondary | ICD-10-CM | POA: Diagnosis not present

## 2022-04-30 MED ORDER — FAMOTIDINE 20 MG PO TABS: ORAL_TABLET | ORAL | 11 refills | Status: DC

## 2022-04-30 NOTE — Assessment & Plan Note (Signed)
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

## 2022-04-30 NOTE — Patient Instructions (Addendum)
GERD (REFLUX)  is an extremely common cause of respiratory symptoms just like yours , many times with no obvious heartburn at all.    It can be treated with medication, but also with lifestyle changes including elevation of the head of your bed (ideally with 6 -8inch blocks under the headboard of your bed),  Smoking cessation, avoidance of late meals, excessive alcohol, and avoid fatty foods, chocolate, peppermint, colas, red wine, and acidic juices such as orange juice.  NO MINT OR MENTHOL PRODUCTS SO NO COUGH DROPS  USE SUGARLESS CANDY INSTEAD (Jolley ranchers or Stover's or Life Savers) or even ice chips will also do - the key is to swallow to prevent all throat clearing. NO OIL BASED VITAMINS - use powdered substitutes.  Avoid fish oil when coughing.    Pantoprazole (protonix) 40 mg   Take  30-60 min before first meal of the day and Pepcid (famotidine)  20 mg after last meal until return to office - this is the best way to tell whether stomach acid is contributing to your problem.    Make sure you check your oxygen saturation  AT  your highest level of activity (not after you stop)   to be sure it stays over 90% and adjust  02 flow upward to maintain this level if needed but remember to turn it back to previous settings when you stop (to conserve your supply).   Only use your albuterol as a rescue medication to be used if you can't catch your breath by resting or doing a relaxed purse lip breathing pattern.  - The less you use it, the better it will work when you need it. - Ok to use up to 2 puffs  every 4 hours if you must but call for immediate appointment if use goes up over your usual need - Don't leave home without it !!  (think of it like the spare tire for your car)   Ok to try albuterol 15 min before an activity (on alternating days)  that you know would usually make you short of breath and see if it makes any difference and if makes none then don't take albuterol after activity unless  you can't catch your breath as this means it's the resting that helps, not the albuterol.      Refer to sleep medicine next available (our clinic here)  Pulmonary clinic follow up is as needed   Late add refer for annual lung cancer screening and thyroid u/s as per rad recs

## 2022-04-30 NOTE — Telephone Encounter (Signed)
Called and discussed with patient and she voiced understanding. She states ten years ago she had an [place] on her thyroid and a bx done but has not done anything with it since. Advised her that it was pointed out by the radiologist after CT scan in 03/2022. She Wanted to know if bx is still necessary. I told her I would call her if Dr. Sherene Sires wanted to cancel bx but if not then PCCs would call to have it scheduled.    Dr. Sherene Sires please advise.

## 2022-04-30 NOTE — Assessment & Plan Note (Signed)
Referred for annual lung cancer screening 04/30/2022 >>>   Low-dose CT lung cancer screening is recommended for patients who are 22-65 years of age with a 20+ pack-year history of smoking and who are currently smoking or quit <=15 years ago. No coughing up blood  No unintentional weight loss of > 15 pounds in the last 6 months - pt is eligible for scanning yearly until 2037 > referred to program   Each maintenance medication was reviewed in detail including emphasizing most importantly the difference between maintenance and prns and under what circumstances the prns are to be triggered using an action plan format where appropriate.  Total time for H and P, chart review, counseling, reviewing hfa device(s) , directly observing portions of ambulatory 02 saturation study/ and generating customized AVS unique to this office visit / same day charting  > 60 min new pt eval

## 2022-04-30 NOTE — Telephone Encounter (Signed)
Proceed with [place] then consider bx - or defer to primary  - whichever she choses

## 2022-04-30 NOTE — Assessment & Plan Note (Signed)
Onset ? July 2022 p moved into new home with pts 07/2021 s airflow obst and neg resp to saba clinically   Upper airway cough syndrome (previously labeled PNDS),  is so named because it's frequently impossible to sort out how much is  CR/sinusitis with freq throat clearing (which can be related to primary GERD)   vs  causing  secondary (" extra esophageal")  GERD from wide swings in gastric pressure that occur with throat clearing, often  promoting self use of mint and menthol lozenges that reduce the lower esophageal sphincter tone and exacerbate the problem further in a cyclical fashion.   These are the same pts (now being labeled as having "irritable larynx syndrome" by some cough centers) who not infrequently have a history of having failed to tolerate ace inhibitors,  dry powder inhalers or biphosphonates or report having atypical/extraesophageal reflux symptoms that don't respond to standard doses of PPI  and are easily confused as having aecopd or asthma flares by even experienced allergists/ pulmonologists (myself included).   rec start with max rx of gerd and f/u in 6 weeks if not better > f/u with just sleep medicine here if better to her satisfaction or if saba dependency increases (dulera 100 or symbicort 80 might be worth taking on a trial basis).

## 2022-04-30 NOTE — Progress Notes (Signed)
Inda CastleHope E Shelton-Cohill, female    DOB: November 23, 1956,   MRN: [id number]   Brief patient profile:  [id number]  yowf  widow of RT quit smoking 02/2021 with "allergies as child" itching/sneezing eval in [place] in her 30s dx self- [place] [facility]  04/30/2022 .  March 2022 admitted to [facility] > admitted  ams >  remained doe / chornic dry cough    History of Present Illness  04/30/2022  Pulmonary/ 1st office [facility]  Chief Complaint  Patient presents with   Consult    SOB. Is on a CPAP   Dyspnea:  shopping at [employer] with [place] [employer] parking   Cough: dry day > noct p moved to July 2022 to older house somewhat wet/ has basement  Sleep: flat bed / big pillow  SABA use: has two hfa's  once or three times daily  02 :  2.5 lpm prn  87-93% but not really checking  Overt hb on ppi hs   No obvious day to day or daytime variability or assoc excess/ purulent sputum or mucus plugs or hemoptysis or cp or chest tightness, subjective wheeze or overt sinus or hb symptoms.   Sleeping  without nocturnal  or early am exacerbation  of respiratory  c/o's or need for noct saba. Also denies any obvious fluctuation of symptoms with weather or environmental changes or other aggravating or alleviating factors except as outlined above   No unusual exposure hx or h/o childhood pna/ asthma or knowledge of premature birth.  Current Allergies, Complete Past Medical History, Past Surgical History, Family History, and Social History were reviewed in [facility] electronic medical record.  ROS  The following are not active complaints unless bolded Hoarseness, sore throat, dysphagia, dental problems, itching, sneezing,  nasal congestion or discharge of excess mucus or purulent secretions, ear ache,   fever, chills, sweats, unintended wt loss or wt gain, classically pleuritic or exertional cp,  orthopnea pnd or arm/hand swelling  or leg swelling, presyncope, palpitations,  abdominal pain, anorexia, nausea, vomiting, diarrhea  or change in bowel habits or change in bladder habits, change in stools or change in urine, dysuria, hematuria,  rash, arthralgias, visual complaints, headache, numbness, weakness or ataxia or problems with walking or coordination,  change in mood or  memory.           Past Medical History:  Diagnosis Date   COPD (chronic obstructive pulmonary disease) (HCC)     Outpatient Medications Prior to Visit  - NOTE:   Unable to verify as accurately reflecting what pt takes    Medication Sig Dispense Refill   albuterol (VENTOLIN HFA) 108 (90 Base) MCG/ACT inhaler Inhale into the lungs.     ALPRAZolam (XANAX) 1 MG tablet Take 1 mg by mouth at bedtime.     aspirin 81 MG EC tablet Take by mouth.     Aspirin-Salicylamide-Caffeine (BC HEADACHE POWDER PO) Take by mouth.     azithromycin (ZITHROMAX) 500 MG tablet Take 1 tablet (500 mg total) by mouth daily. (Patient not taking: Reported on 04/19/2022) 4 tablet 0   benzonatate (TESSALON) 200 MG capsule Take 1 capsule (200 mg total) by mouth 3 (three) times daily as needed for cough. Swallow whole, do not chew 21 capsule 0   buprenorphine (BUTRANS) 20 MCG/HR PTWK 1 patch once a week.     buPROPion (WELLBUTRIN SR) 150 MG 12 hr tablet Take 150 mg by mouth daily.  cefdinir (OMNICEF) 300 MG capsule Take 1 capsule (300 mg total) by mouth every 12 (twelve) hours. (Patient not taking: Reported on 04/19/2022) 8 capsule 0   Cholecalciferol (VITAMIN D3) 1.25 MG (50000 UT) CAPS Take 1 capsule by mouth once a week.     clotrimazole-betamethasone (LOTRISONE) cream Apply topically 2 (two) times daily.     diphenhydrAMINE (BENADRYL) 25 mg capsule Take by mouth.     DULoxetine (CYMBALTA) 60 MG capsule Take 60 mg by mouth daily.     Ferrous Fumarate-Folic Acid 324-1 MG TABS Take 1 tablet by mouth daily.     gabapentin (NEURONTIN) 300 MG capsule Take 300 mg by mouth as needed (pain).     meclizine (ANTIVERT) 25 MG tablet  Take 25 mg by mouth every 8 (eight) hours as needed.     naloxone (NARCAN) nasal spray 4 mg/0.1 mL SMARTSIG:Both Nares     pantoprazole (PROTONIX) 40 MG tablet Take 40 mg by mouth daily.        3 tablet 0   promethazine (PHENERGAN) 25 MG tablet Take 25 mg by mouth every 6 (six) hours as needed.     XARELTO 20 MG TABS tablet Take 20 mg by mouth daily.     No facility-administered medications prior to visit.     Objective:     BP 136/78 (BP Location: Left Arm, Patient Position: Sitting)   Pulse 62   Temp 98.7 F (37.1 C) (Temporal)   Ht 5\' 6"  (1.676 m)   Wt 239 lb 3.2 oz (108.5 kg)   SpO2 94% Comment: ra  BMI 38.61 kg/m   SpO2: 94 % (ra)  Obese wf nad/ mild variable pseudowheeze    HEENT : Oropharynx  edentulous/dentures in place   Nasal turbintes nl    NECK :  without  appent JVD/ palpable Nodes/TM    LUNGS: no acc muscle use,  Nl contour chest which is clear to A and P bilaterally without cough on insp or exp maneuvers   CV:  RRR  no s3 or murmur or increase in P2, and no edema   ABD: markedly obese  soft and nontender with limited inspiratory excursion in the supine position. No bruits or organomegaly appreciated   MS:  Gait a bit awkward due to knee pain ext warm without deformities Or obvious joint restrictions  calf tenderness, cyanosis or clubbing     SKIN: warm and dry without lesions    NEURO:  alert, approp, nl sensorium with  no motor or cerebellar deficits apparent.    CXR PA and Lateral:   04/19/2022 :    I personally reviewed images and agree with radiology impression as follows:   04/19/22 No active cardiopulmonary disease.   I personally reviewed images and agree with radiology impression as follows:   Chest CT w/o contrast 03/23/22  1. Left greater than right patchy ground-glass opacities, likely infectious or inflammatory. 2. Large hiatal hernia. 3. Exophytic nodule of the posterior right thyroid lobe measuring up to 2.3 cm.Recommend thyroid 05/23/22.  Reference  4. Solid pulmonary nodule of the left upper lobe measuring 3 mm in mean diameter on series 4, image 36. No follow-up needed if patient is low-risk.This recommendation follows the consensus statement: Guidelines for Management of Incidental Pulmonary Nodules Detected on CT Images: From the Fleischner Society 2017; Radiology 2017; 284:228-243. 5. Aortic Atherosclerosis (ICD10-I70.0).    Assessment   DOE (dyspnea on exertion) Onset p stopped smoking March 2022 / active GERD and MO  -  PFTs 07/23/21 restrictive  - 04/30/2022   Walked on RA  x  3  lap(s) =  approx 450  ft  @ mod pace, stopped due to end of study mild sob with lowest 02 sats 91%   - 04/30/2022 rec max gerd rx/  And prn saba  Certainly does not have copd and very little evidence of asthma   rec Max rx for GERD (see UACS) and prn saba  - The proper method of use, as well as anticipated side effects, of a metered-dose inhaler were discussed and demonstrated to the patient using teach back method.     Upper airway cough syndrome Onset ? July 2022 p moved into new home with pts 07/2021 s airflow obst and neg resp to saba clinically   Upper airway cough syndrome (previously labeled PNDS),  is so named because it's frequently impossible to sort out how much is  CR/sinusitis with freq throat clearing (which can be related to primary GERD)   vs  causing  secondary (" extra esophageal")  GERD from wide swings in gastric pressure that occur with throat clearing, often  promoting self use of mint and menthol lozenges that reduce the lower esophageal sphincter tone and exacerbate the problem further in a cyclical fashion.   These are the same pts (now being labeled as having "irritable larynx syndrome" by some cough centers) who not infrequently have a history of having failed to tolerate ace inhibitors,  dry powder inhalers or biphosphonates or report having atypical/extraesophageal reflux symptoms that don't respond to standard  doses of PPI  and are easily confused as having aecopd or asthma flares by even experienced allergists/ pulmonologists (myself included).   rec start with max rx of gerd and f/u in 6 weeks if not better > f/u with just sleep medicine here if better to her satisfaction or if saba dependency increases (dulera 100 or symbicort 80 might be worth taking on a trial basis).    Thyroid nodule See Chest CT  03/23/2022 Exophytic nodule of the posterior right thyroid lobe measuring up to 2.3 cm.Recommend thyroid US> ordered 04/30/2022     Former smoker Referred for annual lung cancer screening 04/30/2022 >>>   Low-dose CT lung cancer screening is recommended for patients who are 53-49 years of age with a 20+ pack-year history of smoking and who are currently smoking or quit <=15 years ago. No coughing up blood  No unintentional weight loss of > 15 pounds in the last 6 months - pt is eligible for scanning yearly until 2037 > referred to program   Each maintenance medication was reviewed in detail including emphasizing most importantly the difference between maintenance and prns and under what circumstances the prns are to be triggered using an action plan format where appropriate.  Total time for H and P, chart review, counseling, reviewing hfa device(s) , directly observing portions of ambulatory 02 saturation study/ and generating customized AVS unique to this office visit / same day charting  > 60 min new pt eval          Sandrea Hughs, MD 04/30/2022

## 2022-04-30 NOTE — Assessment & Plan Note (Signed)
Onset p stopped smoking March 2022 / active GERD and MO  - PFTs 07/23/21 restrictive  - 04/30/2022   Walked on RA  x  3  lap(s) =  approx 450  ft  @ mod pace, stopped due to end of study mild sob with lowest 02 sats 91%   - 04/30/2022 rec max gerd rx/  And prn saba  Certainly does not have copd and very little evidence of asthma   rec Max rx for GERD (see UACS) and prn saba  - The proper method of use, as well as anticipated side effects, of a metered-dose inhaler were discussed and demonstrated to the patient using teach back method.

## 2022-04-30 NOTE — Telephone Encounter (Signed)
ATC patient to discuss lung cancer screening program and thyroid US order Dr. Sherene Sires placed after appt. LMTCB

## 2022-05-12 ENCOUNTER — Ambulatory Visit (HOSPITAL_COMMUNITY): Payer: Medicare Other

## 2022-05-25 ENCOUNTER — Institutional Professional Consult (permissible substitution): Payer: Medicare Other | Admitting: Pulmonary Disease

## 2022-05-26 ENCOUNTER — Ambulatory Visit (HOSPITAL_COMMUNITY)
Admission: RE | Admit: 2022-05-26 | Discharge: 2022-05-26 | Disposition: A | Discharge status: Home / Self Care | Payer: Medicare Other | Source: Ambulatory Visit | Attending: Internal Medicine | Admitting: Internal Medicine

## 2022-05-26 DIAGNOSIS — E041 Nontoxic single thyroid nodule: Secondary | ICD-10-CM | POA: Diagnosis present

## 2022-05-28 ENCOUNTER — Ambulatory Visit (INDEPENDENT_AMBULATORY_CARE_PROVIDER_SITE_OTHER): Payer: Medicare Other | Admitting: Pulmonary Disease

## 2022-05-28 ENCOUNTER — Encounter: Payer: Self-pay | Admitting: Pulmonary Disease

## 2022-05-28 VITALS — BP 136/82 | HR 88 | Temp 98.4°F | Ht 66.0 in | Wt 223.8 lb

## 2022-05-28 DIAGNOSIS — G4723 Circadian rhythm sleep disorder, irregular sleep wake type: Secondary | ICD-10-CM

## 2022-05-28 DIAGNOSIS — J9611 Chronic respiratory failure with hypoxia: Secondary | ICD-10-CM | POA: Diagnosis not present

## 2022-05-28 DIAGNOSIS — G4733 Obstructive sleep apnea (adult) (pediatric): Secondary | ICD-10-CM | POA: Diagnosis not present

## 2022-05-28 DIAGNOSIS — G4730 Sleep apnea, unspecified: Secondary | ICD-10-CM | POA: Diagnosis not present

## 2022-05-28 DIAGNOSIS — F5105 Insomnia due to other mental disorder: Secondary | ICD-10-CM

## 2022-05-28 DIAGNOSIS — E669 Obesity, unspecified: Secondary | ICD-10-CM

## 2022-05-28 DIAGNOSIS — F418 Other specified anxiety disorders: Secondary | ICD-10-CM

## 2022-05-28 NOTE — Patient Instructions (Signed)
Set a regular time to go to sleep and wake up, and don't allow yourself to sleep outside this time period.  Will get a copy of your sleep studies and pulmonary function test from [facility].  Will have [employer] refit your Bipap mask.    Will arrange for overnight oxygen test with Bipap.  Follow up in 2 months.

## 2022-05-28 NOTE — Progress Notes (Signed)
[facility]  Chief Complaint  Patient presents with   Consult    Ref by Melvyn Novas. Has bipap at home but is non compliant     Past Surgical History:  She  has a past surgical history that includes Abdominal hysterectomy; Tonsillectomy; Hernia repair; Kidney stone surgery; Laparoscopic Nissen fundoplication; and Parotidectomy.  Past Medical History:  COPD, CAD, Depression, Thyroid nodule, Fibromyalgia, GERD, HH, Nephrolithiasis, Migraine HA, PE 1996, Chronic pain  Constitutional:  BP 136/82 (BP Location: Left Arm, Patient Position: Sitting)   Pulse 88   Temp 98.4 F (36.9 C) (Temporal)   Ht 5' 6"  (1.676 m)   Wt 223 lb 12.8 oz (101.5 kg)   SpO2 94% Comment: ra  BMI 36.12 kg/m   Brief Summary:  Norma Anthony is a 66 y.o. female former smoker with obstructive sleep apnea.      Subjective:   She is here with her husband.  She had sleep study in 2022 with [facility] in [place], [place]. Ended up getting a Bipap machine (Resmed AirCurve 10 VAuto BiPAP, MAX IPAP 14 cmH2O, MIN EPAP 6cmH2O, Pressure Support 4cmH2O).  She got her equipment from [facility] in [place].  She wants to shift sleep therapy to  to be closer to home.  She has trouble falling asleep at night.  She gets very anxious and can't shut her mind off.  She will eventual fall asleep in the early morning hours, but then ends up sleeping for a good portion of the day time.  She has been using xanax and benadryl for years, and these are like water now.    She hasn't been able to use her Bipap recently.  She tried a full face mask, but she felt claustrophobic from this.  She has nasal pillows mask, but got skin irritation and acne when using this.  She was set up with home oxygen after she had pneumonia last year.  She hasn't been using her oxygen set up for a while.  She has fibromyalgia and always feels fatigued.  She says it feels like her whole body is burning.  Her  Epworth score is 12 out of 24.  Physical Exam:   Appearance - well kempt   ENMT - no sinus tenderness, no oral exudate, no LAN, Mallampati 4 airway, no stridor, wears dentures  Respiratory - equal breath sounds bilaterally, no wheezing or rales  CV - s1s2 regular rate and rhythm, no murmurs  Ext - no clubbing, no edema  Skin - no rashes  Psych - normal mood and affect   Pulmonary testing:  PFT 07/21/21 >>   Chest Imaging:  CT chest 03/23/22 >> atherosclerosis, 2.3 cm Rt thyroid nodule, large hiatal hernia, patchy GGO, 3 mm LUL nodule  Sleep Tests:  PSG 03/26/21 >>  Bipap 05/22/21 >  Cardiac Tests:  Echo 02/14/21 >> EF 60 to 65%  Social History:  She  reports that she quit smoking about 15 months ago. Her smoking use included cigarettes. She has a 5.00 pack-year smoking history. She has never used smokeless tobacco. She reports that she does not currently use alcohol. She reports that she does not currently use drugs.  Family History:  Her Family history is unknown by patient.     Assessment/Plan:   Obstructive sleep apnea. - will need to get copy of her sleep studies from [facility] (diagnostic and titration studies) - she uses [employer] for her DME - she has Resmed AirCurve 10 [employer]  BiPAP, MAX IPAP 14 cmH2O, MIN EPAP 6cmH2O, Pressure Support 4cmH2O - will see if she can switch to a SleepWeaver cloth mask  Chronic respiratory failure with hypoxia. - she was set up with home oxygen equipment after she had pneumonia in 2022 - she no longer uses supplemental oxygen during the day - will arrange for overnight oximetry with Bipap and determine if she needs to have additional sleep testing, or if she can have home oxygen set up discontinued  Obesity. - she is aware of how her weight can impact her health  Insomnia with anxiety and depression, and irregular sleep pattern. - discussed stimulus control, sleep restriction, and relaxation techniques  Chronic pain, fibromyalgia. -  per primary care and pain management  Dyspnea on exertion, Upper airway cough syndrome. - followed by Dr. Melvyn Novas - will get copy of her PFT from [facility]  Time Spent Involved in Patient Care on Day of Examination:  49 minutes  Follow up:   Patient Instructions  Set a regular time to go to sleep and wake up, and don't allow yourself to sleep outside this time period.  Will get a copy of your sleep studies and pulmonary function test from [facility].  Will have [employer] refit your Bipap mask.    Will arrange for overnight oxygen test with Bipap.  Follow up in 2 months.  Medication List:   Allergies as of 05/28/2022       Reactions   Pregabalin Other (See Comments)   Other reaction(s): Paralysis present (finding) Other reaction(s): not tolerated Pt stated she could not move her body but she could think  Pt stated she could not move her body but she could think    Droperidol Anxiety   Other reaction(s): anxitey   Ergotamine-caffeine Anxiety   Other reaction(s): Increased systolic arterial pressure (finding)   Prochlorperazine Anxiety, Other (See Comments)   Other reaction(s): anxiety        Medication List        Accurate as of May 28, 2022  9:01 AM. If you have any questions, ask your nurse or doctor.          STOP taking these medications    azithromycin 500 MG tablet Commonly known as: ZITHROMAX Stopped by: Chesley Mires, MD   benzonatate 200 MG capsule Commonly known as: TESSALON Stopped by: Chesley Mires, MD   cefdinir 300 MG capsule Commonly known as: OMNICEF Stopped by: Chesley Mires, MD   gabapentin 300 MG capsule Commonly known as: NEURONTIN Stopped by: Chesley Mires, MD   meclizine 25 MG tablet Commonly known as: ANTIVERT Stopped by: Chesley Mires, MD   predniSONE 50 MG tablet Commonly known as: DELTASONE Stopped by: Chesley Mires, MD       TAKE these medications    albuterol 108 (90 Base) MCG/ACT inhaler Commonly known as: VENTOLIN HFA Inhale into  the lungs.   ALPRAZolam 1 MG tablet Commonly known as: XANAX Take 1 mg by mouth at bedtime.   aspirin EC 81 MG tablet Take by mouth.   BC HEADACHE POWDER PO Take by mouth.   buprenorphine 20 MCG/HR Ptwk Commonly known as: BUTRANS 1 patch once a week.   buPROPion 150 MG 12 hr tablet Commonly known as: WELLBUTRIN SR Take 150 mg by mouth daily.   clotrimazole-betamethasone cream Commonly known as: LOTRISONE Apply topically 2 (two) times daily.   diphenhydrAMINE 25 mg capsule Commonly known as: BENADRYL Take by mouth.   DULoxetine 60 MG capsule Commonly known as: CYMBALTA Take  60 mg by mouth daily.   famotidine 20 MG tablet Commonly known as: Pepcid One after supper   Ferrous Fumarate-Folic Acid 219-7 MG Tabs Take 1 tablet by mouth daily.   naloxone 4 MG/0.1ML Liqd nasal spray kit Commonly known as: NARCAN SMARTSIG:Both Nares   pantoprazole 40 MG tablet Commonly known as: PROTONIX Take 40 mg by mouth daily.   promethazine 25 MG tablet Commonly known as: PHENERGAN Take 25 mg by mouth every 6 (six) hours as needed.   Vitamin D3 1.25 MG (50000 UT) Caps Take 1 capsule by mouth once a week.   Xarelto 20 MG Tabs tablet Generic drug: rivaroxaban Take 20 mg by mouth daily.        Signature:  Chesley Mires, MD [facility] Pager - [phone] 05/28/2022, 9:01 AM

## 2022-05-29 ENCOUNTER — Other Ambulatory Visit: Payer: Self-pay

## 2022-05-29 DIAGNOSIS — E042 Nontoxic multinodular goiter: Secondary | ICD-10-CM

## 2022-06-12 ENCOUNTER — Institutional Professional Consult (permissible substitution): Payer: Medicare Other | Admitting: Pulmonary Disease

## 2022-07-02 ENCOUNTER — Encounter: Payer: Self-pay | Admitting: "Endocrinology

## 2022-07-02 ENCOUNTER — Ambulatory Visit (INDEPENDENT_AMBULATORY_CARE_PROVIDER_SITE_OTHER): Payer: Medicare Other | Admitting: "Endocrinology

## 2022-07-02 VITALS — BP 116/76 | HR 84 | Ht 66.0 in | Wt 240.0 lb

## 2022-07-02 DIAGNOSIS — E042 Nontoxic multinodular goiter: Secondary | ICD-10-CM

## 2022-07-02 NOTE — Progress Notes (Signed)
Endocrinology Consult Note                                            07/02/2022, 5:56 PM   Subjective:    Patient ID: Norma Anthony, female    DOB: 02-12-1956, PCP Clinic, [facility]   Past Medical History:  Diagnosis Date   CAD (coronary artery disease)    COPD (chronic obstructive pulmonary disease) (HCC)    Depression    Fibromyalgia    GERD (gastroesophageal reflux disease)    Hiatal hernia    Migraine headache    Nephrolithiasis    Pulmonary embolism (HCC) 1996   Thyroid nodule    Past Surgical History:  Procedure Laterality Date   ABDOMINAL HYSTERECTOMY     HERNIA REPAIR     KIDNEY STONE SURGERY     LAPAROSCOPIC NISSEN FUNDOPLICATION     PAROTIDECTOMY     TONSILLECTOMY     Social History   Socioeconomic History   Marital status: Married    Spouse name: Not on file   Number of children: Not on file   Years of education: Not on file   Highest education level: Not on file  Occupational History   Not on file  Tobacco Use   Smoking status: Former    Packs/day: 0.50    Years: 10.00    Total pack years: 5.00    Types: Cigarettes    Quit date: 02/12/2021    Years since quitting: 1.3   Smokeless tobacco: Never  Vaping Use   Vaping Use: Never used  Substance and Sexual Activity   Alcohol use: Not Currently   Drug use: Not Currently   Sexual activity: Not on file  Other Topics Concern   Not on file  Social History Narrative   Not on file   Social Determinants of Health   Financial Resource Strain: Not on file  Food Insecurity: Not on file  Transportation Needs: Not on file  Physical Activity: Not on file  Stress: Not on file  Social Connections: Not on file   Family History  Problem Relation Age of Onset   Hypertension Mother    Outpatient Encounter Medications as of 07/02/2022  Medication Sig   Biotin w/ Vitamins C & E (HAIR/SKIN/NAILS PO) Take 1 tablet by mouth daily.   Meclizine HCl 25 MG CHEW Chew by mouth as needed.    Multiple Vitamins-Minerals (MULTIVITAMIN ADULTS PO) Take 1 tablet by mouth daily.   OVER THE COUNTER MEDICATION Hydroxycut taking one daily   albuterol (VENTOLIN HFA) 108 (90 Base) MCG/ACT inhaler Inhale into the lungs.   ALPRAZolam (XANAX) 1 MG tablet Take 1 mg by mouth at bedtime.   aspirin 81 MG EC tablet Take by mouth.   Aspirin-Salicylamide-Caffeine (BC HEADACHE POWDER PO) Take by mouth.   BELBUCA 450 MCG FILM Take by mouth 2 (two) times daily.   Cholecalciferol (VITAMIN D3) 1.25 MG (50000 UT) CAPS Take 1 capsule by mouth once a week.   clotrimazole-betamethasone (LOTRISONE) cream Apply topically 2 (two) times daily. (Patient not taking: Reported on 07/02/2022)   diphenhydrAMINE (BENADRYL) 25 mg capsule Take by mouth.   DULoxetine (CYMBALTA) 60 MG capsule Take 60 mg by mouth daily.   famotidine (PEPCID) 20 MG tablet One after supper   Ferrous Fumarate-Folic Acid 324-1 MG TABS Take 1 tablet by mouth daily.  naloxone (NARCAN) nasal spray 4 mg/0.1 mL SMARTSIG:Both Nares   pantoprazole (PROTONIX) 40 MG tablet Take 40 mg by mouth daily.   promethazine (PHENERGAN) 25 MG tablet Take 25 mg by mouth every 6 (six) hours as needed.   XARELTO 20 MG TABS tablet Take 20 mg by mouth daily.   [DISCONTINUED] buprenorphine (BUTRANS) 20 MCG/HR PTWK 1 patch once a week.   [DISCONTINUED] buPROPion (WELLBUTRIN SR) 150 MG 12 hr tablet Take 150 mg by mouth daily. (Patient not taking: Reported on 05/28/2022)   No facility-administered encounter medications on file as of 07/02/2022.   ALLERGIES: Allergies  Allergen Reactions   Pregabalin Other (See Comments)    Other reaction(s): Paralysis present (finding) Other reaction(s): not tolerated Pt stated she could not move her body but she could think  Pt stated she could not move her body but she could think     Droperidol Anxiety    Other reaction(s): anxitey    Ergotamine-Caffeine Anxiety    Other reaction(s): Increased systolic arterial pressure  (finding)    Prochlorperazine Anxiety and Other (See Comments)    Other reaction(s): anxiety     VACCINATION STATUS: Immunization History  Administered Date(s) Administered   Influenza Split 09/27/2019   Influenza,inj,Quad PF,6+ Mos 09/26/2014, 08/19/2016, 09/22/2018, 09/24/2020   Influenza,inj,quad, With Preservative 10/18/2013   Influenza-Unspecified 09/20/2012   Moderna Sars-Covid-2 Vaccination 03/01/2020, 03/29/2020, 12/12/2020   Pneumococcal Polysaccharide-23 03/15/2012, 07/25/2018   Tdap 10/20/2015    HPI Norma Anthony is 66 y.o. female who presents today with a medical history as above. she is being seen in consultation for multinodular goiter requested by Clinic, [facility].  She is accompanied by her husband to clinic.  History is obtained directly from the patient as well as chart review.  Due to some swallowing problem, she underwent thyroid ultrasound on May 26, 2022 which showed mild goiter with 4.5 cm right lobe and 4.2 cm left lobe, however multiple nodules in bilateral thyroid lobes largest being 2.1 cm on the right lobe. Patient denies any prior history of thyroid dysfunction.  She denies any family history of thyroid malignancy nor thyroid dysfunction. She denies palpitations, tremors, nor voice change.  She reports progressive weight gain. She does not have recent thyroid function test to review.   Review of Systems  Constitutional: no recent weight gain/loss, no fatigue, no subjective hyperthermia, no subjective hypothermia Eyes: no blurry vision, no xerophthalmia ENT: no sore throat, no nodules palpated in throat, no dysphagia/odynophagia, no hoarseness Cardiovascular: no Chest Pain, no Shortness of Breath, no palpitations, no leg swelling Respiratory: no cough, no shortness of breath Gastrointestinal: no Nausea/Vomiting/Diarhhea Musculoskeletal: no muscle/joint aches Skin: no rashes Neurological: no tremors, no numbness, no tingling, no  dizziness Psychiatric: no depression, no anxiety  Objective:       07/02/2022    2:17 PM 05/28/2022    8:31 AM 04/30/2022    8:52 AM  Vitals with BMI  Height 5\' 6"  5\' 6"  5\' 6"   Weight 240 lbs 223 lbs 13 oz 239 lbs 3 oz  BMI 38.76 99991111 [id number]  Systolic [id number]  Diastolic 76 82 78  Pulse 84 88 62    BP 116/76   Pulse 84   Ht 5\' 6"  (1.676 m)   Wt 240 lb (108.9 kg)   BMI 38.74 kg/m   Wt Readings from Last 3 Encounters:  07/02/22 240 lb (108.9 kg)  05/28/22 223 lb 12.8 oz (101.5 kg)  04/30/22 239 lb 3.2 oz (108.5 kg)  Physical Exam  Constitutional:  Body mass index is 38.74 kg/m.,  not in acute distress, normal state of mind Eyes: PERRLA, EOMI, no exophthalmos ENT: moist mucous membranes, + palpable thyromegaly,  no gross cervical lymphadenopathy Cardiovascular: normal precordial activity, Regular Rate and Rhythm, no Murmur/Rubs/Gallops Respiratory:  adequate breathing efforts, no gross chest deformity, Clear to auscultation bilaterally Gastrointestinal: abdomen soft, Non -tender, No distension, Bowel Sounds present, no gross organomegaly Musculoskeletal: no gross deformities, strength intact in all four extremities Skin: moist, warm, no rashes Neurological: no tremor with outstretched hands, Deep tendon reflexes normal in bilateral lower extremities.  CMP ( most recent) CMP     Component Value Date/Time   NA 143 04/19/2022 1608   K 4.2 04/19/2022 1608   CL 107 04/19/2022 1608   CO2 32 04/19/2022 1608   GLUCOSE 92 04/19/2022 1608   BUN 16 04/19/2022 1608   CREATININE 0.74 04/19/2022 1608   CALCIUM 8.6 (L) 04/19/2022 1608   PROT 6.1 (L) 03/23/2022 0628   ALBUMIN 3.0 (L) 03/23/2022 0628   AST 19 03/23/2022 0628   ALT 18 03/23/2022 0628   ALKPHOS 54 03/23/2022 0628   BILITOT 0.3 03/23/2022 0628   GFRNONAA >60 04/19/2022 1608     Thyroid ultrasound on May 26, 2022: Right lobe 4.5 cm, left lobe 4.2 cm.  2 nodules on the right 2.1 cm suspicious, 1.3 cm  nonsuspicious.  1 nodule on the left lobe measuring 1.4 cm nonsuspicious.  IMPRESSION: 1. Heterogeneous multinodular thyroid gland most consistent with multinodular goiter. 2. An approximately 2.1 cm TI-RADS category 4 nodule in the right mid gland meets criteria to consider fine-needle aspiration biopsy if not previously biopsied. 3. A 1.4 cm TI-RADS category 4 nodule in the left mid gland meets criteria for imaging surveillance. Recommend follow-up ultrasound in 1 year.  Assessment & Plan:   1. Multinodular goiter   - Norma Anthony  is being seen at a kind request of [facility], [facility]. - I have reviewed her available thyroid records and clinically evaluated the patient. - Based on these reviews, she has multinodular goiter ,  however,  there is not sufficient information to proceed with definitive treatment plan.  She will be considered for fine-needle aspiration of a suspicious 2.1 cm in nodule on the right lobe of her thyroid.  She will also need thyroid function test.  She will be sent to lab today for Further steps will depend on her biopsy results as well as thyroid function test results.  - I did not initiate any new prescriptions today. - she is advised to maintain close follow up with Clinic, [facility] for primary care needs.   - Time spent with the patient: 50 minutes, of which >50% was spent in  counseling her about her multinodular goiter and the rest in obtaining information about her symptoms, reviewing her previous labs/studies ( including abstractions from other facilities),  evaluations, and treatments,  and developing a plan to confirm diagnosis and long term treatment based on the latest standards of care/guidelines; and documenting her care.  [place] participated in the discussions, expressed understanding, and voiced agreement with the above plans.  All questions were answered to her satisfaction. she is encouraged to contact clinic  should she have any questions or concerns prior to her return visit.  Follow up plan: Return in about 3 weeks (around 07/23/2022) for Labs Today- Non-Fasting Ok, F/U with Biopsy Results.   Glade Lloyd, MD [facility] [address] [place], [place] [address] Phone: [phone]  Fax: [phone]     07/02/2022, 5:56 PM  This note was partially dictated with voice recognition software. Similar sounding words can be transcribed inadequately or may not  be corrected upon review.

## 2022-07-03 LAB — TSH: TSH: 1.16 u[IU]/mL (ref 0.450–4.500)

## 2022-07-03 LAB — THYROGLOBULIN ANTIBODY: Thyroglobulin Antibody: <1 IU/mL (ref 0.0–0.9)

## 2022-07-03 LAB — THYROID PEROXIDASE ANTIBODY: Thyroperoxidase Ab SerPl-aCnc: <9 IU/mL (ref 0–34)

## 2022-07-03 LAB — T3, FREE: T3, Free: 2.9 pg/mL (ref 2.0–4.4)

## 2022-07-03 LAB — T4, FREE: Free T4: 0.89 ng/dL (ref 0.82–1.77)

## 2022-07-05 ENCOUNTER — Other Ambulatory Visit: Payer: Self-pay

## 2022-07-05 ENCOUNTER — Emergency Department (HOSPITAL_COMMUNITY): Payer: Medicare Other

## 2022-07-05 ENCOUNTER — Encounter (HOSPITAL_COMMUNITY): Payer: Self-pay

## 2022-07-05 ENCOUNTER — Emergency Department (HOSPITAL_COMMUNITY)
Admission: EM | Admit: 2022-07-05 | Discharge: 2022-07-05 | Disposition: A | Discharge status: Home / Self Care | Payer: Medicare Other | Attending: Emergency Medicine | Admitting: Emergency Medicine

## 2022-07-05 DIAGNOSIS — R051 Acute cough: Secondary | ICD-10-CM

## 2022-07-05 DIAGNOSIS — Z7982 Long term (current) use of aspirin: Secondary | ICD-10-CM | POA: Insufficient documentation

## 2022-07-05 DIAGNOSIS — R059 Cough, unspecified: Secondary | ICD-10-CM | POA: Diagnosis present

## 2022-07-05 DIAGNOSIS — Z7901 Long term (current) use of anticoagulants: Secondary | ICD-10-CM | POA: Insufficient documentation

## 2022-07-05 DIAGNOSIS — Z20822 Contact with and (suspected) exposure to covid-19: Secondary | ICD-10-CM | POA: Diagnosis not present

## 2022-07-05 LAB — CBC WITH DIFFERENTIAL/PLATELET
Abs Immature Granulocytes: 0.02 10*3/uL (ref 0.00–0.07)
Basophils Absolute: 0.1 10*3/uL (ref 0.0–0.1)
Basophils Relative: 1 %
Eosinophils Absolute: 0.3 10*3/uL (ref 0.0–0.5)
Eosinophils Relative: 3 %
HCT: 42.6 % (ref 36.0–46.0)
Hemoglobin: 13.3 g/dL (ref 12.0–15.0)
Immature Granulocytes: 0 %
Lymphocytes Relative: 33 %
Lymphs Abs: 2.7 10*3/uL (ref 0.7–4.0)
MCH: 31.5 pg (ref 26.0–34.0)
MCHC: 31.2 g/dL (ref 30.0–36.0)
MCV: 100.9 fL — ABNORMAL HIGH (ref 80.0–100.0)
Monocytes Absolute: 0.6 10*3/uL (ref 0.1–1.0)
Monocytes Relative: 8 %
Neutro Abs: 4.4 10*3/uL (ref 1.7–7.7)
Neutrophils Relative %: 55 %
Platelets: 175 10*3/uL (ref 150–400)
RBC: 4.22 MIL/uL (ref 3.87–5.11)
RDW: 14 % (ref 11.5–15.5)
WBC: 8.1 10*3/uL (ref 4.0–10.5)
nRBC: 0 % (ref 0.0–0.2)

## 2022-07-05 LAB — BASIC METABOLIC PANEL
Anion gap: 8 (ref 5–15)
BUN: 14 mg/dL (ref 8–23)
CO2: 30 mmol/L (ref 22–32)
Calcium: 8.7 mg/dL — ABNORMAL LOW (ref 8.9–10.3)
Chloride: 103 mmol/L (ref 98–111)
Creatinine, Ser: 0.79 mg/dL (ref 0.44–1.00)
GFR, Estimated: >60 mL/min (ref >60)
Glucose, Bld: 90 mg/dL (ref 70–99)
Potassium: 3.7 mmol/L (ref 3.5–5.1)
Sodium: 141 mmol/L (ref 135–145)

## 2022-07-05 LAB — RESP PANEL BY RT-PCR (FLU A&B, COVID) ARPGX2
Influenza A by PCR: NEGATIVE
Influenza B by PCR: NEGATIVE
SARS Coronavirus 2 by RT PCR: NEGATIVE

## 2022-07-05 MED ORDER — ACETAMINOPHEN 325 MG PO TABS
650.0000 mg | ORAL_TABLET | Freq: Once | ORAL | Status: AC
  Administered 2022-07-05: 650 mg via ORAL
  Filled 2022-07-05: qty 2

## 2022-07-05 MED ORDER — BENZONATATE 100 MG PO CAPS
100.0000 mg | ORAL_CAPSULE | Freq: Once | ORAL | Status: AC
  Administered 2022-07-05: 100 mg via ORAL
  Filled 2022-07-05: qty 1

## 2022-07-05 MED ORDER — BENZONATATE 100 MG PO CAPS: 100.0000 mg | ORAL_CAPSULE | Freq: Three times a day (TID) | ORAL | 0 refills | Status: DC

## 2022-07-05 NOTE — ED Triage Notes (Addendum)
Pt states she has not been feeling well since yesterday. Pt states she has had a productive cough- unsure of color. Pt states she has had SHOB and some chest pain, which she attributes to coughing. Denies N/V/D. Pt states she has a sore throat and headache as well. Endorses generalized body aches

## 2022-07-05 NOTE — Discharge Instructions (Addendum)
Get help right away if:  You cough up blood.  You have difficulty breathing.  Your heartbeat is very fast.

## 2022-07-05 NOTE — ED Provider Notes (Signed)
[facility] EMERGENCY DEPARTMENT Provider Note   CSN: [id number] Arrival date & time: 07/05/22  1352     History  No chief complaint on file.   Norma Anthony is a 66 y.o. female with a pmh of copd, recurrent pneumonia, opioid dependence, fibromyalgia, hx of PE chronically anticoagulated on Xarelto who presents with cc of Cough.  Patient has had 1 day of productive nondescript cough.  She also has a mild sore throat and headache.  She uses her inhaler at home.  She feels like she may have a little bit of wheezing.  Took nothing prior to arrival.  And asks for pain medication.  She denies fever but does have associated generalized malaise and fatigue.  The history is provided by the patient.  Cough      Home Medications Prior to Admission medications   Medication Sig Start Date End Date Taking? Authorizing Provider  albuterol (VENTOLIN HFA) 108 (90 Base) MCG/ACT inhaler Inhale into the lungs. 05/12/21   [provider]  ALPRAZolam Prudy Feeler) 1 MG tablet Take 1 mg by mouth at bedtime.    [provider]  aspirin 81 MG EC tablet Take by mouth.    [provider]  Aspirin-Salicylamide-Caffeine (BC HEADACHE POWDER PO) Take by mouth.    [provider]  BELBUCA 450 MCG FILM Take by mouth 2 (two) times daily. 06/12/22   [provider]  Biotin w/ Vitamins C & E (HAIR/SKIN/NAILS PO) Take 1 tablet by mouth daily.    [provider]  Cholecalciferol (VITAMIN D3) 1.25 MG (50000 UT) CAPS Take 1 capsule by mouth once a week. 02/16/22   [provider]  clotrimazole-betamethasone (LOTRISONE) cream Apply topically 2 (two) times daily. Patient not taking: Reported on 07/02/2022 03/09/22   [provider]  diphenhydrAMINE (BENADRYL) 25 mg capsule Take by mouth.    [provider]  DULoxetine (CYMBALTA) 60 MG capsule Take 60 mg by mouth daily. 09/14/18   [provider]  famotidine (PEPCID) 20 MG tablet One after  supper 04/30/22   Nyoka Cowden, MD  Ferrous Fumarate-Folic Acid 324-1 MG TABS Take 1 tablet by mouth daily. 02/29/20   [provider]  Meclizine HCl 25 MG CHEW Chew by mouth as needed.    [provider]  Multiple Vitamins-Minerals (MULTIVITAMIN ADULTS PO) Take 1 tablet by mouth daily.    [provider]  naloxone [facility]) nasal spray 4 mg/0.1 mL SMARTSIG:Both Nares 10/16/21   [provider]  OVER THE COUNTER MEDICATION Hydroxycut taking one daily    [provider]  pantoprazole (PROTONIX) 40 MG tablet Take 40 mg by mouth daily. 06/30/16   [provider]  promethazine (PHENERGAN) 25 MG tablet Take 25 mg by mouth every 6 (six) hours as needed. 03/03/22   [provider]  XARELTO 20 MG TABS tablet Take 20 mg by mouth daily. 08/21/21   [provider]      Allergies    Pregabalin, Droperidol, Ergotamine-caffeine, and Prochlorperazine    Review of Systems   Review of Systems  Respiratory:  Positive for cough.     Physical Exam Updated Vital Signs There were no vitals taken for this visit. Physical Exam Vitals and nursing note reviewed.  Constitutional:      General: She is not in acute distress.    Appearance: She is well-developed. She is not diaphoretic.  HENT:     Head: Normocephalic and atraumatic.     Right Ear: External ear  normal.     Left Ear: External ear normal.     Nose: Nose normal.     Mouth/Throat:     Mouth: Mucous membranes are moist.  Eyes:     General: No scleral icterus.    Extraocular Movements: Extraocular movements intact.     Conjunctiva/sclera: Conjunctivae normal.     Pupils: Pupils are equal, round, and reactive to light.  Cardiovascular:     Rate and Rhythm: Normal rate and regular rhythm.     Heart sounds: Normal heart sounds. No murmur heard.    No friction rub. No gallop.  Pulmonary:     Effort: Pulmonary effort is normal. No tachypnea, accessory muscle usage, prolonged  expiration, respiratory distress or retractions.     Breath sounds: Normal breath sounds. Transmitted upper airway sounds present. No stridor or decreased air movement. No decreased breath sounds, wheezing, rhonchi or rales.  Chest:     Chest wall: No tenderness.  Abdominal:     General: Bowel sounds are normal. There is no distension.     Palpations: Abdomen is soft. There is no mass.     Tenderness: There is no abdominal tenderness. There is no guarding.  Musculoskeletal:     Cervical back: Normal range of motion.  Lymphadenopathy:     Cervical: No cervical adenopathy.  Skin:    General: Skin is warm and dry.  Neurological:     Mental Status: She is alert and oriented to person, place, and time.  Psychiatric:        Behavior: Behavior normal.     ED Results / Procedures / Treatments   Labs (all labs ordered are listed, but only abnormal results are displayed) Labs Reviewed - No data to display  EKG None  Radiology No results found.  Procedures Procedures    Medications Ordered in ED Medications - No data to display  ED Course/ Medical Decision Making/ A&P Clinical Course as of 07/05/22 1457  Sun Jul 05, 2022  1456 DG Chest 2 View I visualized and reviewed two-view chest x-ray which shows no acute findings. [AH]  1457 EKG shows sinus rhythm, no signs of ischemia [AH]    Clinical Course User Index [AH] Arthor Captain, PA-C                           Medical Decision Making 66 year old female who presents with cough. Differential diagnosis for emergent cause of cough includes but is not limited to upper respiratory infection, lower respiratory infection, allergies, asthma, irritants, foreign body, medications such as ACE inhibitors, reflux, asthma, CHF, lung cancer, interstitial lung disease, psychiatric causes, postnasal drip and postinfectious bronchospasm.  On evaluation the patient has excellent air movement, no wheezes, no evidence of volume overload. I  visualized and interpreted a 2 view chest x-ray which shows no abnormalities and agree with radiologic interpretation I reviewed the patient's labs which shows no leukocytosis, BMP without significant abnormality No evidence of COVID or influenza  Patient given Tessalon here and will be discharged with the same without evidence of respiratory distress pneumonia or other significant lung abnormality.  Social determinants of health include insurance and strong outpatient follow-up.  Amount and/or Complexity of Data Reviewed Labs: ordered. Radiology: ordered and independent interpretation performed. Decision-making details documented in ED Course.  Risk OTC drugs. Prescription drug management.           Final Clinical Impression(s) / ED Diagnoses Final diagnoses:  None  Rx / DC Orders ED Discharge Orders     None         Arthor Captain, PA-C 07/05/22 2238    Pricilla Loveless, MD 07/09/22 2121

## 2022-07-09 ENCOUNTER — Ambulatory Visit (HOSPITAL_COMMUNITY)
Admission: RE | Admit: 2022-07-09 | Discharge: 2022-07-09 | Disposition: A | Discharge status: Home / Self Care | Payer: Medicare Other | Source: Ambulatory Visit | Attending: "Endocrinology | Admitting: "Endocrinology

## 2022-07-09 ENCOUNTER — Encounter (HOSPITAL_COMMUNITY): Payer: Self-pay

## 2022-07-09 DIAGNOSIS — E042 Nontoxic multinodular goiter: Secondary | ICD-10-CM | POA: Insufficient documentation

## 2022-07-09 MED ORDER — LIDOCAINE HCL (PF) 2 % IJ SOLN
INTRAMUSCULAR | Status: AC
  Administered 2022-07-09: 10 mL
  Filled 2022-07-09: qty 10

## 2022-07-09 NOTE — Progress Notes (Signed)
PT tolerated thyroid biopsy procedure well today. Labs and afirma obtained and sent for pathology. PT ambulatory at discharge with no acute distress noted and verbalized understanding of discharge instructions. 

## 2022-07-10 LAB — CYTOLOGY - NON PAP

## 2022-07-23 ENCOUNTER — Ambulatory Visit (INDEPENDENT_AMBULATORY_CARE_PROVIDER_SITE_OTHER): Payer: Medicare Other | Admitting: "Endocrinology

## 2022-07-23 ENCOUNTER — Encounter: Payer: Self-pay | Admitting: "Endocrinology

## 2022-07-23 ENCOUNTER — Institutional Professional Consult (permissible substitution): Payer: Medicare Other | Admitting: Pulmonary Disease

## 2022-07-23 VITALS — BP 120/78 | HR 108 | Ht 66.0 in | Wt 233.6 lb

## 2022-07-23 DIAGNOSIS — E042 Nontoxic multinodular goiter: Secondary | ICD-10-CM

## 2022-07-23 NOTE — Progress Notes (Signed)
07/23/2022, 5:57 PM  Endocrinology follow-up note   Subjective:    Patient ID: Norma Anthony, female    DOB: 05-04-1956, PCP Clinic, [facility]   Past Medical History:  Diagnosis Date   CAD (coronary artery disease)    COPD (chronic obstructive pulmonary disease) (HCC)    Depression    Fibromyalgia    GERD (gastroesophageal reflux disease)    Hiatal hernia    Migraine headache    Nephrolithiasis    Pulmonary embolism (HCC) 1996   Thyroid nodule    Past Surgical History:  Procedure Laterality Date   ABDOMINAL HYSTERECTOMY     BIOPSY THYROID     HERNIA REPAIR     KIDNEY STONE SURGERY     LAPAROSCOPIC NISSEN FUNDOPLICATION     PAROTIDECTOMY     TONSILLECTOMY     Social History   Socioeconomic History   Marital status: Married    Spouse name: Not on file   Number of children: Not on file   Years of education: Not on file   Highest education level: Not on file  Occupational History   Not on file  Tobacco Use   Smoking status: Former    Packs/day: 0.50    Years: 10.00    Total pack years: 5.00    Types: Cigarettes    Quit date: 02/12/2021    Years since quitting: 1.4   Smokeless tobacco: Never  Vaping Use   Vaping Use: Never used  Substance and Sexual Activity   Alcohol use: Not Currently   Drug use: Not Currently   Sexual activity: Not on file  Other Topics Concern   Not on file  Social History Narrative   Not on file   Social Determinants of Health   Financial Resource Strain: Not on file  Food Insecurity: Not on file  Transportation Needs: Not on file  Physical Activity: Not on file  Stress: Not on file  Social Connections: Not on file   Family History  Problem Relation Age of Onset   Hypertension Mother    Outpatient Encounter Medications as of 07/23/2022  Medication Sig   albuterol (VENTOLIN HFA) 108 (90 Base) MCG/ACT inhaler Inhale into the lungs.   ALPRAZolam (XANAX) 1 MG  tablet Take 1 mg by mouth at bedtime.   aspirin 81 MG EC tablet Take by mouth.   Aspirin-Salicylamide-Caffeine (BC HEADACHE POWDER PO) Take by mouth.   BELBUCA 450 MCG FILM Take by mouth 2 (two) times daily.   benzonatate (TESSALON) 100 MG capsule Take 1 capsule (100 mg total) by mouth every 8 (eight) hours.   Biotin w/ Vitamins C & E (HAIR/SKIN/NAILS PO) Take 1 tablet by mouth daily.   Cholecalciferol (VITAMIN D3) 1.25 MG (50000 UT) CAPS Take 1 capsule by mouth once a week.   clotrimazole-betamethasone (LOTRISONE) cream Apply topically 2 (two) times daily. (Patient not taking: Reported on 07/02/2022)   diphenhydrAMINE (BENADRYL) 25 mg capsule Take by mouth.   DULoxetine (CYMBALTA) 60 MG capsule Take 60 mg by mouth daily.   famotidine (PEPCID) 20 MG tablet One after supper   Ferrous Fumarate-Folic Acid 324-1 MG TABS Take 1 tablet by mouth daily.   Meclizine HCl 25 MG CHEW Chew by  mouth as needed.   Multiple Vitamins-Minerals (MULTIVITAMIN ADULTS PO) Take 1 tablet by mouth daily.   naloxone (NARCAN) nasal spray 4 mg/0.1 mL SMARTSIG:Both Nares   OVER THE COUNTER MEDICATION Hydroxycut taking one daily   pantoprazole (PROTONIX) 40 MG tablet Take 40 mg by mouth daily.   promethazine (PHENERGAN) 25 MG tablet Take 25 mg by mouth every 6 (six) hours as needed.   XARELTO 20 MG TABS tablet Take 20 mg by mouth daily.   No facility-administered encounter medications on file as of 07/23/2022.   ALLERGIES: Allergies  Allergen Reactions   Pregabalin Other (See Comments)    Other reaction(s): Paralysis present (finding) Other reaction(s): not tolerated Pt stated she could not move her body but she could think  Pt stated she could not move her body but she could think     Droperidol Anxiety    Other reaction(s): anxitey    Ergotamine-Caffeine Anxiety    Other reaction(s): Increased systolic arterial pressure (finding)    Prochlorperazine Anxiety and Other (See Comments)    Other reaction(s):  anxiety     VACCINATION STATUS: Immunization History  Administered Date(s) Administered   Influenza Split 09/27/2019   Influenza,inj,Quad PF,6+ Mos 09/26/2014, 08/19/2016, 09/22/2018, 09/24/2020   Influenza,inj,quad, With Preservative 10/18/2013   Influenza-Unspecified 09/20/2012   Moderna Sars-Covid-2 Vaccination 03/01/2020, 03/29/2020, 12/12/2020   Pneumococcal Polysaccharide-23 03/15/2012, 07/25/2018   Tdap 10/20/2015    HPI Norma Anthony is 66 y.o. female who presents today with a medical history as above. she is being seen in follow-up after she was seen in consultation for multinodular goiter requested by Clinic, [facility].  She is accompanied by her husband to clinic.    Due to some swallowing problem, she underwent thyroid ultrasound on May 26, 2022 which showed mild goiter with 4.5 cm right lobe and 4.2 cm left lobe, however multiple nodules in bilateral thyroid lobes largest being 2.1 cm on the right lobe. She was sent for fine-needle aspiration biopsy of this nodule which returns with benign findings. Patient denies any prior history of thyroid dysfunction.  She denies any family history of thyroid malignancy nor thyroid dysfunction. She denies palpitations, tremors, nor voice change.  She reports progressive weight gain. She does not have recent thyroid function test to review.   Review of Systems  Constitutional: no recent weight gain/loss, no fatigue, no subjective hyperthermia, no subjective hypothermia Eyes: no blurry vision, no xerophthalmia ENT: no sore throat, no nodules palpated in throat, no dysphagia/odynophagia, no hoarseness Cardiovascular: no Chest Pain, no Shortness of Breath, no palpitations, no leg swelling Respiratory: no cough, no shortness of breath Gastrointestinal: no Nausea/Vomiting/Diarhhea Musculoskeletal: no muscle/joint aches Skin: no rashes Neurological: no tremors, no numbness, no tingling, no dizziness Psychiatric: no  depression, no anxiety  Objective:       07/23/2022    3:45 PM 07/09/2022   11:10 AM 07/05/2022    3:30 PM  Vitals with BMI  Height 5\' 6"     Weight 233 lbs 10 oz    BMI [id number]    Systolic [id number]  Diastolic 78 97 67  Pulse [id number] 88     BP 120/78   Pulse (!) 108   Ht 5\' 6"  (1.676 m)   Wt 233 lb 9.6 oz (106 kg)   BMI 37.70 kg/m   Wt Readings from Last 3 Encounters:  07/23/22 233 lb 9.6 oz (106 kg)  07/05/22 240 lb (108.9 kg)  07/02/22 240 lb (108.9 kg)    Physical  Exam  Constitutional:  Body mass index is 37.7 kg/m.,  not in acute distress, normal state of mind Eyes: PERRLA, EOMI, no exophthalmos ENT: moist mucous membranes, + palpable thyromegaly,  no gross cervical lymphadenopathy   CMP ( most recent) CMP     Component Value Date/Time   NA 141 07/05/2022 1431   K 3.7 07/05/2022 1431   CL 103 07/05/2022 1431   CO2 30 07/05/2022 1431   GLUCOSE 90 07/05/2022 1431   BUN 14 07/05/2022 1431   CREATININE 0.79 07/05/2022 1431   CALCIUM 8.7 (L) 07/05/2022 1431   PROT 6.1 (L) 03/23/2022 0628   ALBUMIN 3.0 (L) 03/23/2022 0628   AST 19 03/23/2022 0628   ALT 18 03/23/2022 0628   ALKPHOS 54 03/23/2022 0628   BILITOT 0.3 03/23/2022 0628   GFRNONAA >60 07/05/2022 1431     Thyroid ultrasound on May 26, 2022: Right lobe 4.5 cm, left lobe 4.2 cm.  2 nodules on the right 2.1 cm suspicious, 1.3 cm nonsuspicious.  1 nodule on the left lobe measuring 1.4 cm nonsuspicious.  IMPRESSION: 1. Heterogeneous multinodular thyroid gland most consistent with multinodular goiter. 2. An approximately 2.1 cm TI-RADS category 4 nodule in the right mid gland meets criteria to consider fine-needle aspiration biopsy if not previously biopsied. 3. A 1.4 cm TI-RADS category 4 nodule in the left mid gland meets criteria for imaging surveillance. Recommend follow-up ultrasound in 1 year.  Fine-needle aspiration biopsy of the thyroid on July 09, 2022 FINAL MICROSCOPIC DIAGNOSIS:  -  Consistent with benign follicular nodule (Bethesda category II)  Adequate normal follicular cells are present mixed with colloid and rare  lymphocytes.  Recent Results (from the past 2160 hour(s))  TSH     Status: None   Collection Time: 07/02/22  3:24 PM  Result Value Ref Range   TSH 1.160 0.450 - 4.500 uIU/mL  T4, free     Status: None   Collection Time: 07/02/22  3:24 PM  Result Value Ref Range   Free T4 0.89 0.82 - 1.77 ng/dL  T3, free     Status: None   Collection Time: 07/02/22  3:24 PM  Result Value Ref Range   T3, Free 2.9 2.0 - 4.4 pg/mL  Thyroid peroxidase antibody     Status: None   Collection Time: 07/02/22  3:24 PM  Result Value Ref Range   Thyroperoxidase Ab SerPl-aCnc <9 0 - 34 IU/mL  Thyroglobulin antibody     Status: None   Collection Time: 07/02/22  3:24 PM  Result Value Ref Range   Thyroglobulin Antibody <1.0 0.0 - 0.9 IU/mL    Comment: Thyroglobulin Antibody measured by USAA Methodology  Basic metabolic panel     Status: Abnormal   Collection Time: 07/05/22  2:31 PM  Result Value Ref Range   Sodium 141 135 - 145 mmol/L   Potassium 3.7 3.5 - 5.1 mmol/L   Chloride 103 98 - 111 mmol/L   CO2 30 22 - 32 mmol/L   Glucose, Bld 90 70 - 99 mg/dL    Comment: Glucose reference range applies only to samples taken after fasting for at least 8 hours.   BUN 14 8 - 23 mg/dL   Creatinine, Ser 0.79 0.44 - 1.00 mg/dL   Calcium 8.7 (L) 8.9 - 10.3 mg/dL   GFR, Estimated >60 >60 mL/min    Comment: (NOTE) Calculated using the CKD-EPI Creatinine Equation (2021)    Anion gap 8 5 - 15    Comment: Performed  at [facility], [address] [place], [place] [address]  CBC with Differential     Status: Abnormal   Collection Time: 07/05/22  2:31 PM  Result Value Ref Range   WBC 8.1 4.0 - 10.5 K/uL   RBC 4.22 3.87 - 5.11 MIL/uL   Hemoglobin 13.3 12.0 - 15.0 g/dL   HCT 60.442.6 54.036.0 - 98.146.0 %   MCV 100.9 (H) 80.0 - 100.0 fL   MCH 31.5 26.0 - 34.0 pg   MCHC 31.2 30.0 -  36.0 g/dL   RDW 19.114.0 47.811.5 - 29.515.5 %   Platelets 175 150 - 400 K/uL   nRBC 0.0 0.0 - 0.2 %   Neutrophils Relative % 55 %   Neutro Abs 4.4 1.7 - 7.7 K/uL   Lymphocytes Relative 33 %   Lymphs Abs 2.7 0.7 - 4.0 K/uL   Monocytes Relative 8 %   Monocytes Absolute 0.6 0.1 - 1.0 K/uL   Eosinophils Relative 3 %   Eosinophils Absolute 0.3 0.0 - 0.5 K/uL   Basophils Relative 1 %   Basophils Absolute 0.1 0.0 - 0.1 K/uL   Immature Granulocytes 0 %   Abs Immature Granulocytes 0.02 0.00 - 0.07 K/uL    Comment: Performed at [facility], [address] [place], [place] [address]  Resp Panel by RT-PCR (Flu A&B, Covid) Anterior Nasal Swab     Status: None   Collection Time: 07/05/22  2:37 PM   Specimen: Anterior Nasal Swab  Result Value Ref Range   SARS Coronavirus 2 by RT PCR NEGATIVE NEGATIVE    Comment: (NOTE) SARS-CoV-2 target nucleic acids are NOT DETECTED.  The SARS-CoV-2 RNA is generally detectable in upper respiratory specimens during the acute phase of infection. The lowest concentration of SARS-CoV-2 viral copies this assay can detect is 138 copies/mL. A negative result does not preclude SARS-Cov-2 infection and should not be used as the sole basis for treatment or other patient management decisions. A negative result may occur with  improper specimen collection/handling, submission of specimen other than nasopharyngeal swab, presence of viral mutation(s) within the areas targeted by this assay, and inadequate number of viral copies(<138 copies/mL). A negative result must be combined with clinical observations, patient history, and epidemiological information. The expected result is Negative.  Fact Sheet for Patients:  BloggerCourse.comhttps://www.fda.gov/media/152166/download  Fact Sheet for Healthcare Providers:  SeriousBroker.ithttps://www.fda.gov/media/152162/download  This test is no t yet approved or cleared by the [place] FDA and  has been authorized for detection and/or diagnosis of SARS-CoV-2  by FDA under an Emergency Use Authorization (EUA). This EUA will remain  in effect (meaning this test can be used) for the duration of the COVID-19 declaration under Section 564(b)(1) of the Act, 21 U.S.C.section 360bbb-3(b)(1), unless the authorization is terminated  or revoked sooner.       Influenza A by PCR NEGATIVE NEGATIVE   Influenza B by PCR NEGATIVE NEGATIVE    Comment: (NOTE) The Xpert Xpress SARS-CoV-2/FLU/RSV plus assay is intended as an aid in the diagnosis of influenza from Nasopharyngeal swab specimens and should not be used as a sole basis for treatment. Nasal washings and aspirates are unacceptable for Xpert Xpress SARS-CoV-2/FLU/RSV testing.  Fact Sheet for Patients: BloggerCourse.comhttps://www.fda.gov/media/152166/download  Fact Sheet for Healthcare Providers: SeriousBroker.ithttps://www.fda.gov/media/152162/download  This test is not yet approved or cleared by the [place] FDA and has been authorized for detection and/or diagnosis of SARS-CoV-2 by FDA under an Emergency Use Authorization (EUA). This EUA will remain in effect (meaning this test can be  used) for the duration of the COVID-19 declaration under Section 564(b)(1) of the Act, 21 U.S.C. section 360bbb-3(b)(1), unless the authorization is terminated or revoked.  Performed at [facility], [address] [place], [place] [address]   Cytology - Non PAP;     Status: None   Collection Time: 07/09/22 10:56 AM  Result Value Ref Range   CYTOLOGY - NON GYN      CYTOLOGY - NON PAP CASE: [id number] PATIENT: Norma Anthony Non-Gynecological Cytology Report     Clinical History: 2.1 cm RML Specimen Submitted:  A. THYROID, RML, FINE NEEDLE ASPIRATION:   FINAL MICROSCOPIC DIAGNOSIS: - Consistent with benign follicular nodule (Bethesda category II) Adequate normal follicular cells are present mixed with colloid and rare lymphocytes.  SPECIMEN ADEQUACY: Satisfactory for evaluation  GROSS: Received is/are 6 slides in 95%  Ethyl alcohol and 30 ccs of pale pink Cytolyt solution. (CM:cm) Prepared: Smears:  6 Concentration Method (Thin Prep):  1 Cell Block:  Cell block attempted, not obtained. Additional Studies:  Also there was an Afirma collected.     Final Diagnosis performed by Arloa Koh, MD.   Electronically signed 07/10/2022 Technical component performed at [facility], [address], [place], [place] [address].  Professional component performed at [employer], [address], [place], [place] [address].  Immunohistochemistry Technical component (if applicable) was performed at [facility]. [address], [place], [place] [address].   IMMUNOHISTOCHEMISTRY DISCLAIMER (if applicable): Some of these immunohistochemical stains may have been developed and the performance characteristics determine by [facility]. Some may not have been cleared or approved by the U.S. Food and Drug Administration. The FDA has determined that such clearance or approval is not necessary. This test is used for clinical purposes. It should not be regarded as investigational or for research. This laboratory is certified under the Clinical Laboratory Improvement Amendments of 1988 (CLIA-88) as qualified to perform high complexity clinical laboratory testing.  The controls stained appropriately.       Assessment & Plan:   1. Multinodular goiter I discussed her labs as well as biopsy results with her and her husband. Her previsit thyroid function tests are consistent with euthyroid presentation.  Her fine-needle aspiration biopsy of the thyroid nodule was benign.  She will not need antithyroid intervention at this time.  She will return in 1 year with repeat thyroid function tests. - I did not initiate any new prescriptions today. - she is advised to maintain close follow up with Clinic, [facility] for primary care needs.   I spent  25 minutes in the care of the patient today including review of labs from Thyroid Function, CMP, and other relevant labs ; imaging/biopsy records (current and previous including abstractions from other facilities); face-to-face time discussing  her lab results and symptoms, medications doses, her options of short and long term treatment based on the latest standards of care / guidelines;   and documenting the encounter.  Norma Anthony  participated in the discussions, expressed understanding, and voiced agreement with the above plans.  All questions were answered to her satisfaction. she is encouraged to contact clinic should she have any questions or concerns prior to her return visit.   Follow up plan: Return in about 1 year (around 07/24/2023) for F/U with Pre-visit Labs.   Marquis Lunch, MD [facility], [place] [address] [place], [place] [address] Phone: [phone]  Fax: [phone]  07/23/2022, 5:57 PM  This note was partially dictated with voice recognition software. Similar sounding words can be transcribed inadequately or may not  be corrected upon review.

## 2022-07-24 ENCOUNTER — Encounter: Payer: Self-pay | Admitting: Pulmonary Disease

## 2022-07-24 ENCOUNTER — Ambulatory Visit (INDEPENDENT_AMBULATORY_CARE_PROVIDER_SITE_OTHER): Payer: Medicare Other | Admitting: Pulmonary Disease

## 2022-07-24 VITALS — BP 128/72 | HR 102 | Temp 98.4°F | Ht 66.0 in | Wt 234.0 lb

## 2022-07-24 DIAGNOSIS — G4733 Obstructive sleep apnea (adult) (pediatric): Secondary | ICD-10-CM

## 2022-07-24 NOTE — Patient Instructions (Signed)
Will arrange for home sleep study Will call to arrange for follow up after sleep study reviewed  

## 2022-07-24 NOTE — Progress Notes (Signed)
[name]  Chief Complaint  Patient presents with   Follow-up    Adapt made patient return Bipap.     Past Surgical History:  She  has a past surgical history that includes Abdominal hysterectomy; Tonsillectomy; Hernia repair; Kidney stone surgery; Laparoscopic Nissen fundoplication; Parotidectomy; and Biopsy thyroid.  Past Medical History:  COPD, CAD, Depression, Thyroid nodule, Fibromyalgia, GERD, HH, Nephrolithiasis, Migraine HA, PE 1996, Chronic pain  Constitutional:  BP 128/72 (BP Location: Left Wrist, Patient Position: Sitting)   Pulse (!) 102   Temp 98.4 F (36.9 C) (Temporal)   Ht _0  (1.676 m)   Wt 234 lb (106.1 kg)   SpO2 94% Comment: ra  BMI 37.77 kg/m   Brief Summary:  Norma Anthony is a 66 y.o. female former smoker with obstructive sleep apnea.      Subjective:   She is here with her husband.  Didn't receive previous sleep study results.  She had to turn in her Bipap machine.  Never got SleepWeaver mask.    She is still struggling with her sleep.  Snores and stops breathing.  Talks in her sleep.  Wakes up several times to use the bathroom.  Can fall asleep when sitting quiet.  Overnight oximetry wasn't done.  SpO2 low 88% on room air after walking 2 laps in office.  She asked to stop before doing the 3rd lap.  Physical Exam:   Appearance - well kempt   ENMT - no sinus tenderness, no oral exudate, no LAN, Mallampati 3 airway, no stridor  Respiratory - equal breath sounds bilaterally, no wheezing or rales  CV - s1s2 regular rate and rhythm, no murmurs  Ext - no clubbing, no edema  Skin - no rashes  Psych - normal mood and affect    Pulmonary testing:  PFT 07/21/21 >> FEV1 2.18 (82%), FEV1% 84, TLC 3.91 (73%), DLCO 71%  Chest Imaging:  CT chest 03/23/22 >> atherosclerosis, 2.3 cm Rt thyroid nodule, large hiatal hernia, patchy GGO, 3 mm LUL nodule  Sleep Tests:    Cardiac Tests:  Echo  02/14/21 >> EF 60 to 65%  Social History:  She  reports that she quit smoking about 17 months ago. Her smoking use included cigarettes. She has a 5.00 pack-year smoking history. She has never used smokeless tobacco. She reports that she does not currently use alcohol. She reports that she does not currently use drugs.  Family History:  Her family history includes Hypertension in her mother.     Assessment/Plan:   Obstructive sleep apnea. - she still has snoring, sleep disruption, apnea, and daytime sleepiness - I am concerned she still has significant sleep apnea - will arrange for home sleep study to assess current status and then determine if she needs to revisit PAP therapy  Chronic respiratory failure with hypoxia. - she was set up with home oxygen equipment after she had pneumonia in 2022 - still has borderline SpO2 with ambulation - she has home oxygen set up - goal SpO2 > 90%  Obesity. - she is aware of how her weight can impact her health  Insomnia with anxiety and depression, and irregular sleep pattern. - discussed stimulus control, sleep restriction, and relaxation techniques  Chronic pain, fibromyalgia. - per primary care and pain management  Dyspnea on exertion, Upper airway cough syndrome. - followed by Dr. Melvyn Novas - will get copy of her PFT from [facility]  Time Spent Involved in Patient Care on Day  of Examination:  36 minutes  Follow up:   Patient Instructions  Will arrange for home sleep study Will call to arrange for follow up after sleep study reviewed   Medication List:   Allergies as of 07/24/2022       Reactions   Pregabalin Other (See Comments)   Other reaction(s): Paralysis present (finding) Other reaction(s): not tolerated Pt stated she could not move her body but she could think  Pt stated she could not move her body but she could think    Droperidol Anxiety   Other reaction(s): anxitey   Ergotamine-caffeine Anxiety   Other reaction(s):  Increased systolic arterial pressure (finding)   Prochlorperazine Anxiety, Other (See Comments)   Other reaction(s): anxiety        Medication List        Accurate as of July 24, 2022 11:22 AM. If you have any questions, ask your nurse or doctor.          albuterol 108 (90 Base) MCG/ACT inhaler Commonly known as: VENTOLIN HFA Inhale into the lungs.   ALPRAZolam 1 MG tablet Commonly known as: XANAX Take 1 mg by mouth at bedtime.   aspirin EC 81 MG tablet Take by mouth.   BC HEADACHE POWDER PO Take by mouth.   Belbuca 450 MCG Film Generic drug: Buprenorphine HCl Take by mouth 2 (two) times daily.   benzonatate 100 MG capsule Commonly known as: TESSALON Take 1 capsule (100 mg total) by mouth every 8 (eight) hours.   clotrimazole-betamethasone cream Commonly known as: LOTRISONE Apply topically 2 (two) times daily.   diphenhydrAMINE 25 mg capsule Commonly known as: BENADRYL Take by mouth.   DULoxetine 60 MG capsule Commonly known as: CYMBALTA Take 60 mg by mouth daily.   famotidine 20 MG tablet Commonly known as: Pepcid One after supper   Ferrous Fumarate-Folic Acid 185-6 MG Tabs Take 1 tablet by mouth daily.   HAIR/SKIN/NAILS PO Take 1 tablet by mouth daily.   Meclizine HCl 25 MG Chew Chew by mouth as needed.   MULTIVITAMIN ADULTS PO Take 1 tablet by mouth daily.   naloxone 4 MG/0.1ML Liqd nasal spray kit Commonly known as: NARCAN SMARTSIG:Both Nares   OVER THE COUNTER MEDICATION Hydroxycut taking one daily   pantoprazole 40 MG tablet Commonly known as: PROTONIX Take 40 mg by mouth daily.   promethazine 25 MG tablet Commonly known as: PHENERGAN Take 25 mg by mouth every 6 (six) hours as needed.   Vitamin D3 1.25 MG (50000 UT) Caps Take 1 capsule by mouth once a week.   Xarelto 20 MG Tabs tablet Generic drug: rivaroxaban Take 20 mg by mouth daily.        Signature:  Chesley Mires, MD [place] Pager -  [phone] 07/24/2022, 11:22 AM

## 2022-08-27 ENCOUNTER — Other Ambulatory Visit: Payer: Self-pay

## 2022-08-27 ENCOUNTER — Emergency Department (HOSPITAL_COMMUNITY)
Admission: EM | Admit: 2022-08-27 | Discharge: 2022-08-27 | Disposition: A | Discharge status: Home / Self Care | Payer: Medicare Other | Attending: Emergency Medicine | Admitting: Emergency Medicine

## 2022-08-27 ENCOUNTER — Emergency Department (HOSPITAL_COMMUNITY): Payer: Medicare Other

## 2022-08-27 ENCOUNTER — Encounter (HOSPITAL_COMMUNITY): Payer: Self-pay | Admitting: Emergency Medicine

## 2022-08-27 DIAGNOSIS — D696 Thrombocytopenia, unspecified: Secondary | ICD-10-CM | POA: Insufficient documentation

## 2022-08-27 DIAGNOSIS — Z7901 Long term (current) use of anticoagulants: Secondary | ICD-10-CM | POA: Insufficient documentation

## 2022-08-27 DIAGNOSIS — R0602 Shortness of breath: Secondary | ICD-10-CM | POA: Diagnosis not present

## 2022-08-27 DIAGNOSIS — Z7982 Long term (current) use of aspirin: Secondary | ICD-10-CM | POA: Diagnosis not present

## 2022-08-27 DIAGNOSIS — M7989 Other specified soft tissue disorders: Secondary | ICD-10-CM | POA: Diagnosis present

## 2022-08-27 LAB — CBC WITH DIFFERENTIAL/PLATELET
Abs Immature Granulocytes: 0.02 10*3/uL (ref 0.00–0.07)
Basophils Absolute: 0.1 10*3/uL (ref 0.0–0.1)
Basophils Relative: 1 %
Eosinophils Absolute: 0.3 10*3/uL (ref 0.0–0.5)
Eosinophils Relative: 3 %
HCT: 40.2 % (ref 36.0–46.0)
Hemoglobin: 12.4 g/dL (ref 12.0–15.0)
Immature Granulocytes: 0 %
Lymphocytes Relative: 18 %
Lymphs Abs: 1.6 10*3/uL (ref 0.7–4.0)
MCH: 32.1 pg (ref 26.0–34.0)
MCHC: 30.8 g/dL (ref 30.0–36.0)
MCV: 104.1 fL — ABNORMAL HIGH (ref 80.0–100.0)
Monocytes Absolute: 0.5 10*3/uL (ref 0.1–1.0)
Monocytes Relative: 6 %
Neutro Abs: 6.6 10*3/uL (ref 1.7–7.7)
Neutrophils Relative %: 72 %
Platelets: 140 10*3/uL — ABNORMAL LOW (ref 150–400)
RBC: 3.86 MIL/uL — ABNORMAL LOW (ref 3.87–5.11)
RDW: 14 % (ref 11.5–15.5)
WBC: 9.1 10*3/uL (ref 4.0–10.5)
nRBC: 0 % (ref 0.0–0.2)

## 2022-08-27 LAB — BASIC METABOLIC PANEL
Anion gap: 9 (ref 5–15)
BUN: 16 mg/dL (ref 8–23)
CO2: 24 mmol/L (ref 22–32)
Calcium: 8.7 mg/dL — ABNORMAL LOW (ref 8.9–10.3)
Chloride: 109 mmol/L (ref 98–111)
Creatinine, Ser: 0.81 mg/dL (ref 0.44–1.00)
GFR, Estimated: >60 mL/min (ref >60)
Glucose, Bld: 89 mg/dL (ref 70–99)
Potassium: 3.8 mmol/L (ref 3.5–5.1)
Sodium: 142 mmol/L (ref 135–145)

## 2022-08-27 LAB — BRAIN NATRIURETIC PEPTIDE: B Natriuretic Peptide: 21 pg/mL (ref 0.0–100.0)

## 2022-08-27 MED ORDER — ACETAMINOPHEN 325 MG PO TABS
650.0000 mg | ORAL_TABLET | Freq: Once | ORAL | Status: AC
  Administered 2022-08-27: 650 mg via ORAL
  Filled 2022-08-27: qty 2

## 2022-08-27 MED ORDER — KETOROLAC TROMETHAMINE 15 MG/ML IJ SOLN
15.0000 mg | Freq: Once | INTRAMUSCULAR | Status: AC
  Administered 2022-08-27: 15 mg via INTRAMUSCULAR
  Filled 2022-08-27: qty 1

## 2022-08-27 MED ORDER — CEPHALEXIN 500 MG PO CAPS: 500.0000 mg | ORAL_CAPSULE | Freq: Four times a day (QID) | ORAL | 0 refills | Status: DC

## 2022-08-27 MED ORDER — FUROSEMIDE 20 MG PO TABS: 20.0000 mg | ORAL_TABLET | Freq: Every day | ORAL | 0 refills | Status: DC

## 2022-08-27 NOTE — ED Triage Notes (Signed)
Pt presents with bilateral leg swelling x 2 wks, saw PCP last week and put on Neurontin.

## 2022-08-27 NOTE — ED Provider Notes (Signed)
[facility] EMERGENCY DEPARTMENT Provider Note   CSN: [id number] Arrival date & time: 08/27/22  0850     History Chief Complaint  Patient presents with   Leg Swelling    Terry Fichter is a 66 y.o. female patient who presents to the emerged department today for further evaluation of bilateral leg swelling worse on the right for the last 2 weeks.  Patient was seen evaluated by her primary care doctor and is thought to be due to neuropathy she was having some burning in the legs as well.  She was restarted on gabapentin.  Patient currently on Xarelto for a pulmonary embolism 2 years ago.  She endorses shortness of breath which she states is not new.  She denies any chest pain, fever, chills, abdominal pain, nausea, vomiting, diarrhea.  HPI     Home Medications Prior to Admission medications   Medication Sig Start Date End Date Taking? Authorizing Provider  cephALEXin (KEFLEX) 500 MG capsule Take 1 capsule (500 mg total) by mouth 4 (four) times daily. 08/27/22  Yes Tachina Spoonemore M, PA-C  albuterol (VENTOLIN HFA) 108 (90 Base) MCG/ACT inhaler Inhale into the lungs. 05/12/21   [provider]  ALPRAZolam Prudy Feeler) 1 MG tablet Take 1 mg by mouth at bedtime.    [provider]  aspirin 81 MG EC tablet Take by mouth.    [provider]  Aspirin-Salicylamide-Caffeine (BC HEADACHE POWDER PO) Take by mouth.    [provider]  BELBUCA 450 MCG FILM Take by mouth 2 (two) times daily. 06/12/22   [provider]  benzonatate (TESSALON) 100 MG capsule Take 1 capsule (100 mg total) by mouth every 8 (eight) hours. 07/05/22   Arthor Captain, PA-C  Biotin w/ Vitamins C & E (HAIR/SKIN/NAILS PO) Take 1 tablet by mouth daily.    [provider]  Cholecalciferol (VITAMIN D3) 1.25 MG (50000 UT) CAPS Take 1 capsule by mouth once a week. 02/16/22   [provider]  clotrimazole-betamethasone (LOTRISONE) cream Apply topically 2 (two) times daily. 03/09/22    [provider]  diphenhydrAMINE (BENADRYL) 25 mg capsule Take by mouth.    [provider]  DULoxetine (CYMBALTA) 60 MG capsule Take 60 mg by mouth daily. 09/14/18   [provider]  famotidine (PEPCID) 20 MG tablet One after supper 04/30/22   Nyoka Cowden, MD  Ferrous Fumarate-Folic Acid 324-1 MG TABS Take 1 tablet by mouth daily. 02/29/20   [provider]  Meclizine HCl 25 MG CHEW Chew by mouth as needed.    [provider]  Multiple Vitamins-Minerals (MULTIVITAMIN ADULTS PO) Take 1 tablet by mouth daily.    [provider]  naloxone [facility]) nasal spray 4 mg/0.1 mL SMARTSIG:Both Nares 10/16/21   [provider]  OVER THE COUNTER MEDICATION Hydroxycut taking one daily    [provider]  pantoprazole (PROTONIX) 40 MG tablet Take 40 mg by mouth daily. 06/30/16   [provider]  promethazine (PHENERGAN) 25 MG tablet Take 25 mg by mouth every 6 (six) hours as needed. 03/03/22   [provider]  XARELTO 20 MG TABS tablet Take 20 mg by mouth daily. 08/21/21   [provider]      Allergies    Pregabalin, Droperidol, Ergotamine-caffeine, and Prochlorperazine    Review of Systems   Review of Systems  All other systems reviewed and are negative.   Physical Exam Updated Vital Signs BP 132/87   Pulse 89   Temp 98.5 F (  36.9 C) (Oral)   Resp 18   Ht 5\' 6"  (1.676 m)   Wt 112.9 kg   SpO2 97%   BMI 40.19 kg/m  Physical Exam Vitals and nursing note reviewed.  Constitutional:      General: She is not in acute distress.    Appearance: Normal appearance.  HENT:     Head: Normocephalic and atraumatic.  Eyes:     General:        Right eye: No discharge.        Left eye: No discharge.  Cardiovascular:     Comments: Regular rate and rhythm.  S1/S2 are distinct without any evidence of murmur, rubs, or gallops.  Radial pulses are 2+ bilaterally.  Dorsalis pedis pulses are 2+ bilaterally.    Pulmonary:     Comments: Clear to auscultation bilaterally.  Normal effort.  No respiratory distress.  No evidence of wheezes, rales, or rhonchi heard throughout. Abdominal:     General: Abdomen is flat. Bowel sounds are normal. There is no distension.     Tenderness: There is no abdominal tenderness. There is no guarding or rebound.  Musculoskeletal:        General: Normal range of motion.     Cervical back: Neck supple.     Right lower leg: 2+ Pitting Edema present.     Left lower leg: 1+ Pitting Edema present.  Skin:    General: Skin is warm and dry.     Findings: No rash.  Neurological:     General: No focal deficit present.     Mental Status: She is alert.  Psychiatric:        Mood and Affect: Mood normal.        Behavior: Behavior normal.     ED Results / Procedures / Treatments   Labs (all labs ordered are listed, but only abnormal results are displayed) Labs Reviewed  CBC WITH DIFFERENTIAL/PLATELET - Abnormal; Notable for the following components:      Result Value   RBC 3.86 (*)    MCV 104.1 (*)    Platelets 140 (*)    All other components within normal limits  BASIC METABOLIC PANEL - Abnormal; Notable for the following components:   Calcium 8.7 (*)    All other components within normal limits  BRAIN NATRIURETIC PEPTIDE    EKG None  Radiology Venous Img Lower Unilateral Right  Result Date: 08/27/2022 CLINICAL DATA:  Lower extremity swelling EXAM: LEFT LOWER EXTREMITY VENOUS DOPPLER ULTRASOUND TECHNIQUE: Gray-scale sonography with graded compression, as well as color Doppler and duplex ultrasound were performed to evaluate the lower extremity deep venous systems from the level of the common femoral vein and including the common femoral, femoral, profunda femoral, popliteal and calf veins including the posterior tibial, peroneal and gastrocnemius veins when visible. The superficial great saphenous vein was also interrogated. Spectral Doppler was utilized to  evaluate flow at rest and with distal augmentation maneuvers in the common femoral, femoral and popliteal veins. COMPARISON:  None Available. FINDINGS: Contralateral Common Femoral Vein: Respiratory phasicity is normal and symmetric with the symptomatic side. No evidence of thrombus. Normal compressibility. Common Femoral Vein: No evidence of thrombus. Normal compressibility, respiratory phasicity and response to augmentation. Saphenofemoral Junction: No evidence of thrombus. Normal compressibility and flow on color Doppler imaging. Profunda Femoral Vein: No evidence of thrombus. Normal compressibility and flow on color Doppler imaging. Femoral Vein: No evidence of thrombus. Normal compressibility, respiratory phasicity and response to augmentation. Popliteal Vein: No  evidence of thrombus. Normal compressibility, respiratory phasicity and response to augmentation. Calf Veins: No evidence of thrombus within limits of the exam due to edema. Normal flow on color Doppler imaging. Other Findings:  Subcutaneous edema within the calf. IMPRESSION: No evidence of deep venous thrombosis. Electronically Signed   By: Olive Bass M.D.   On: 08/27/2022 11:26   DG Chest Portable 1 View  Result Date: 08/27/2022 CLINICAL DATA:  Bilateral leg swelling EXAM: PORTABLE CHEST 1 VIEW COMPARISON:  07/05/2022 FINDINGS: Left lower lobe airspace disease. No pleural effusion or pneumothorax. Stable cardiomediastinal silhouette. Moderate-sized hiatal hernia. No acute osseous abnormality. IMPRESSION: 1. Left lower lobe airspace disease concerning for atelectasis versus pneumonia. Electronically Signed   By: Elige Ko M.D.   On: 08/27/2022 10:56    Procedures Procedures    Medications Ordered in ED Medications  acetaminophen (TYLENOL) tablet 650 mg (has no administration in time range)  ketorolac (TORADOL) 15 MG/ML injection 15 mg (has no administration in time range)    ED Course/ Medical Decision Making/ A&P Clinical  Course as of 08/27/22 1224  Thu Aug 27, 2022  1218 CBC with Differential(!) There is some thrombocytopenia but otherwise normal. [CF]  1218 Basic metabolic panel(!) Normal. [CF]  1218 Brain natriuretic peptide Normal. [CF]  1218 US Venous Img Lower Unilateral Right No evidence of ultrasound.  I personally ordered and interpreted this study. [CF]  1218 DG Chest Portable 1 View I have a low suspicion for pneumonia.  Clinically she appears well.  Personally ordered and interpreted the study. [CF]    Clinical Course User Index [CF] Teressa Lower, PA-C                           Medical Decision Making [facility] Plourde is a 66 y.o. female patient who presents to the emergency department today for further evaluation of bilateral lower extremity swelling worse on the right.  Right leg is certainly more swollen and tender to palpation.  Slightly warm to touch.  I am concerned for possible DVT considering her history of pulmonary embolism.  Although patient is on Xarelto will get ultrasound to further evaluate.  I will also get some basic labs and a BNP to evaluate for possible heart failure.  Last echo was performed last year which was normal per chart review.  Patient's vital signs are completely normal she is in no acute distress.  I will reassess once some of the labs return.  No evidence of DVT.  No evidence of leukocytosis.  I will cover her with Keflex in the event that this is a developing cellulitis of the right lower extremity.  I have her follow-up with her primary care doctor for further evaluation.  Strict return precautions were discussed.  Toradol given for pain.  She is safe for discharge.  Amount and/or Complexity of Data Reviewed External Data Reviewed: notes.    Details: See MDM and HPI.  Labs: ordered. Decision-making details documented in ED Course. Radiology: ordered and independent interpretation performed.  Risk OTC drugs. Prescription drug management.   Final Clinical  Impression(s) / ED Diagnoses Final diagnoses:  Leg swelling    Rx / DC Orders ED Discharge Orders          Ordered    cephALEXin (KEFLEX) 500 MG capsule  4 times daily        08/27/22 1217              Meredeth Ide,  Arrie Eastern, PA-C 08/27/22 1224    Bethann Berkshire, MD 08/30/22 [phone]

## 2022-08-27 NOTE — Discharge Instructions (Signed)
Follow-up with your primary care doctor for further evaluation.  You may take antibiotics as prescribed in the event this is a developing cellulitis.  Please return to the emergency department for any worsening symptoms you might have.

## 2022-08-27 NOTE — ED Notes (Signed)
Call placed to lab to add on BNP

## 2022-08-31 ENCOUNTER — Emergency Department (HOSPITAL_COMMUNITY)
Admission: EM | Admit: 2022-08-31 | Discharge: 2022-08-31 | Disposition: A | Discharge status: Home / Self Care | Payer: Medicare Other | Attending: Emergency Medicine | Admitting: Emergency Medicine

## 2022-08-31 ENCOUNTER — Other Ambulatory Visit: Payer: Self-pay

## 2022-08-31 ENCOUNTER — Encounter (HOSPITAL_COMMUNITY): Payer: Self-pay

## 2022-08-31 ENCOUNTER — Emergency Department (HOSPITAL_COMMUNITY): Payer: Medicare Other

## 2022-08-31 DIAGNOSIS — Z7951 Long term (current) use of inhaled steroids: Secondary | ICD-10-CM | POA: Diagnosis not present

## 2022-08-31 DIAGNOSIS — I2510 Atherosclerotic heart disease of native coronary artery without angina pectoris: Secondary | ICD-10-CM | POA: Diagnosis not present

## 2022-08-31 DIAGNOSIS — S8992XA Unspecified injury of left lower leg, initial encounter: Secondary | ICD-10-CM

## 2022-08-31 DIAGNOSIS — Z7901 Long term (current) use of anticoagulants: Secondary | ICD-10-CM | POA: Insufficient documentation

## 2022-08-31 DIAGNOSIS — J449 Chronic obstructive pulmonary disease, unspecified: Secondary | ICD-10-CM | POA: Diagnosis not present

## 2022-08-31 DIAGNOSIS — Z7982 Long term (current) use of aspirin: Secondary | ICD-10-CM | POA: Insufficient documentation

## 2022-08-31 DIAGNOSIS — W010XXA Fall on same level from slipping, tripping and stumbling without subsequent striking against object, initial encounter: Secondary | ICD-10-CM | POA: Insufficient documentation

## 2022-08-31 DIAGNOSIS — W19XXXA Unspecified fall, initial encounter: Secondary | ICD-10-CM

## 2022-08-31 MED ORDER — FENTANYL CITRATE PF 50 MCG/ML IJ SOSY
50.0000 ug | PREFILLED_SYRINGE | Freq: Once | INTRAMUSCULAR | Status: AC
  Administered 2022-08-31: 50 ug via INTRAVENOUS
  Filled 2022-08-31: qty 1

## 2022-08-31 NOTE — Discharge Instructions (Signed)
Apply ice packs on and off to your leg.  You may keep the area wrapped for walking or weightbearing.  Use your walker and elevate your leg when possible.  Follow-up with your orthopedic provider for recheck in 1 week if not improving. Return to ER for any worsening symptoms

## 2022-08-31 NOTE — ED Triage Notes (Signed)
Pt reports tripping and falling yesterday evening, now reports left lower leg pain, unsure what she hit her leg on. Denies LOC or hitting head.

## 2022-08-31 NOTE — ED Notes (Signed)
Ace wrap applied to left leg

## 2022-09-01 ENCOUNTER — Telehealth: Payer: Self-pay | Admitting: Pulmonary Disease

## 2022-09-01 NOTE — Telephone Encounter (Signed)
Spoke to patient and she states she was calling to give [place] the number for medical records.  Advised her I will call them in the morning to see if we can get her sleep study records

## 2022-09-01 NOTE — Telephone Encounter (Signed)
ATC patient.  LMTCB. 

## 2022-09-01 NOTE — Telephone Encounter (Signed)
Atc patient.  Closing encounter per triage protocol after 2 attempts to call patient back.

## 2022-09-02 NOTE — Telephone Encounter (Signed)
Med record release of information faxed to [phone].  Nothing further needed at this time. Will update encounter once medical records are received.

## 2022-09-03 NOTE — ED Provider Notes (Signed)
[facility] EMERGENCY DEPARTMENT Provider Note   CSN: [id number] Arrival date & time: 08/31/22  1041     History  Chief Complaint  [facility]    Shalise Christell ConstantMoore is a 66 y.o. female.   Fall Pertinent negatives include no abdominal pain and no shortness of breath.        Shritha Christell ConstantMoore is a 66 y.o. female with past medical history of COPD, fibromyalgia, coronary artery disease, and currently anticoagulated with Xarelto PE 2 years ago.  She presents to the Emergency Department complaining of pain of her left lower leg secondary to a mechanical fall that occurred 1 day prior to arrival.  She believes that she struck her leg on something as she fell but she is unsure.  She describes aching pain associated with movement to her lateral lower leg.  She denies any weakness, numbness, or increased swelling of her leg.  No open wound or bruising.  She denies head injury or LOC.  Home Medications Prior to Admission medications   Medication Sig Start Date End Date Taking? Authorizing Provider  albuterol (VENTOLIN HFA) 108 (90 Base) MCG/ACT inhaler Inhale into the lungs. 05/12/21   [provider]  ALPRAZolam Prudy Feeler(XANAX) 1 MG tablet Take 1 mg by mouth at bedtime.    [provider]  aspirin 81 MG EC tablet Take by mouth.    [provider]  Aspirin-Salicylamide-Caffeine (BC HEADACHE POWDER PO) Take by mouth.    [provider]  BELBUCA 450 MCG FILM Take by mouth 2 (two) times daily. 06/12/22   [provider]  benzonatate (TESSALON) 100 MG capsule Take 1 capsule (100 mg total) by mouth every 8 (eight) hours. 07/05/22   Arthor CaptainHarris, Abigail, PA-C  Biotin w/ Vitamins C & E (HAIR/SKIN/NAILS PO) Take 1 tablet by mouth daily.    [provider]  cephALEXin (KEFLEX) 500 MG capsule Take 1 capsule (500 mg total) by mouth 4 (four) times daily. 08/27/22   Teressa LowerFleming, Conner M, PA-C  Cholecalciferol (VITAMIN D3) 1.25 MG (50000 UT) CAPS Take 1 capsule by  mouth once a week. 02/16/22   [provider]  clotrimazole-betamethasone (LOTRISONE) cream Apply topically 2 (two) times daily. 03/09/22   [provider]  diphenhydrAMINE (BENADRYL) 25 mg capsule Take by mouth.    [provider]  DULoxetine (CYMBALTA) 60 MG capsule Take 60 mg by mouth daily. 09/14/18   [provider]  famotidine (PEPCID) 20 MG tablet One after supper 04/30/22   Nyoka CowdenWert, Michael B, MD  Ferrous Fumarate-Folic Acid 324-1 MG TABS Take 1 tablet by mouth daily. 02/29/20   [provider]  furosemide (LASIX) 20 MG tablet Take 1 tablet (20 mg total) by mouth daily. 08/27/22   Teressa LowerFleming, Conner M, PA-C  Meclizine HCl 25 MG CHEW Chew by mouth as needed.    [provider]  Multiple Vitamins-Minerals (MULTIVITAMIN ADULTS PO) Take 1 tablet by mouth daily.    [provider]  naloxone [facility](NARCAN) nasal spray 4 mg/0.1 mL SMARTSIG:Both Nares 10/16/21   [provider]  OVER THE COUNTER MEDICATION Hydroxycut taking one daily    [provider]  pantoprazole (PROTONIX) 40 MG tablet Take 40 mg by mouth daily. 06/30/16   [provider]  promethazine (PHENERGAN) 25 MG tablet Take 25 mg by mouth every 6 (six) hours as needed. 03/03/22   [provider]  XARELTO 20 MG TABS tablet Take 20 mg by mouth daily. 08/21/21   [provider]  Allergies    Pregabalin, Droperidol, Ergotamine-caffeine, and Prochlorperazine    Review of Systems   Review of Systems  Constitutional:  Negative for fever.  Respiratory:  Negative for shortness of breath.   Gastrointestinal:  Negative for abdominal pain, nausea and vomiting.  Musculoskeletal:  Positive for myalgias (Left lower leg pain).  Skin:  Negative for color change, rash and wound.  Neurological:  Negative for weakness and numbness.    Physical Exam Updated Vital Signs BP (!) 145/90 (BP Location: Left Arm)   Pulse 78   Temp 98.6 F (37 C)   Resp 16   Ht  5\' 6"  (1.676 m)   Wt 108.9 kg   SpO2 100%   BMI 38.74 kg/m  Physical Exam Vitals and nursing note reviewed.  Constitutional:      Appearance: Normal appearance.  HENT:     Head: Atraumatic.  Cardiovascular:     Rate and Rhythm: Normal rate and regular rhythm.     Pulses: Normal pulses.  Pulmonary:     Effort: Pulmonary effort is normal.  Musculoskeletal:     Left lower leg: Tenderness present. No swelling or bony tenderness. No edema.       Legs:     Comments: Localized tenderness to palpation of the left lateral lower leg.  Tenderness involves the lateral calf muscle.  Negative Thompson test.  No edema, erythema, abrasion, excessive warmth or ecchymosis.  Achilles tendon is intact.  Negative Homans' sign.  Skin:    General: Skin is warm.     Capillary Refill: Capillary refill takes less than 2 seconds.     Findings: No bruising, erythema or rash.  Neurological:     General: No focal deficit present.     Mental Status: She is alert.     Sensory: No sensory deficit.     Motor: No weakness.     ED Results / Procedures / Treatments   Labs (all labs ordered are listed, but only abnormal results are displayed) Labs Reviewed - No data to display  EKG None  Radiology DG Tibia/Fibula Left  Result Date: 08/31/2022 CLINICAL DATA:  Left lower leg pain after fall EXAM: LEFT TIBIA AND FIBULA - 2 VIEW COMPARISON:  None Available. FINDINGS: There is no evidence of fracture or other focal bone lesions. No dislocation. Lateral compartment osteoarthritis of the left knee. Soft tissues are unremarkable. IMPRESSION: 1. No acute osseous abnormality of the left tibia and fibula. 2. Lateral compartment osteoarthritis of the left knee. Electronically Signed   By: Davina Poke D.O.   On: 08/31/2022 11:54   US Venous Img Lower Unilateral Right  Result Date: 08/27/2022 CLINICAL DATA:  Lower extremity swelling EXAM: LEFT LOWER EXTREMITY VENOUS DOPPLER ULTRASOUND TECHNIQUE: Gray-scale  sonography with graded compression, as well as color Doppler and duplex ultrasound were performed to evaluate the lower extremity deep venous systems from the level of the common femoral vein and including the common femoral, femoral, profunda femoral, popliteal and calf veins including the posterior tibial, peroneal and gastrocnemius veins when visible. The superficial great saphenous vein was also interrogated. Spectral Doppler was utilized to evaluate flow at rest and with distal augmentation maneuvers in the common femoral, femoral and popliteal veins. COMPARISON:  None Available. FINDINGS: Contralateral Common Femoral Vein: Respiratory phasicity is normal and symmetric with the symptomatic side. No evidence of thrombus. Normal compressibility. Common Femoral Vein: No evidence of thrombus. Normal compressibility, respiratory phasicity and response to augmentation. Saphenofemoral Junction: No evidence of thrombus. Normal compressibility  and flow on color Doppler imaging. Profunda Femoral Vein: No evidence of thrombus. Normal compressibility and flow on color Doppler imaging. Femoral Vein: No evidence of thrombus. Normal compressibility, respiratory phasicity and response to augmentation. Popliteal Vein: No evidence of thrombus. Normal compressibility, respiratory phasicity and response to augmentation. Calf Veins: No evidence of thrombus within limits of the exam due to edema. Normal flow on color Doppler imaging. Other Findings:  Subcutaneous edema within the calf. IMPRESSION: No evidence of deep venous thrombosis. Electronically Signed   By: Olive Bass M.D.   On: 08/27/2022 11:26   DG Chest Portable 1 View  Result Date: 08/27/2022 CLINICAL DATA:  Bilateral leg swelling EXAM: PORTABLE CHEST 1 VIEW COMPARISON:  07/05/2022 FINDINGS: Left lower lobe airspace disease. No pleural effusion or pneumothorax. Stable cardiomediastinal silhouette. Moderate-sized hiatal hernia. No acute osseous abnormality.  IMPRESSION: 1. Left lower lobe airspace disease concerning for atelectasis versus pneumonia. Electronically Signed   By: Elige Ko M.D.   On: 08/27/2022 10:56     Procedures Procedures    Medications Ordered in ED Medications  fentaNYL (SUBLIMAZE) injection 50 mcg (50 mcg Intravenous Given 08/31/22 1329)    ED Course/ Medical Decision Making/ A&P                           Medical Decision Making Patient here for left lower extremity pain after mechanical fall.  She is having pain along the lateral left lower leg.  Pain worse with weightbearing.  Seen by PCP recently for burning and swelling to bilateral lower extremities, and felt to have peripheral neuropathy.  She was also seen here 4 days ago and had venous ultrasound of right lower extremity.  She is currently anticoagulated for PE 2 years ago  On exam, patient has localized tenderness of lateral left lower leg.  No bruising or ecchymosis.  Compartments are soft.  Neurovascularly intact.  Extremity is warm and pink without signs suggestive of ischemia or cellulitis.  Differential diagnosis would include but not limited to musculoskeletal injury, DVT, cellulitis, ischemia.  I suspect this is musculoskeletal as pain is localized to lateral calf muscle and associated with mechanical fall.  On review of medical records, she was seen here 4 days ago with worsening pain of right lower extremity and underwent venous imaging that was negative.  She has also been recently evaluated by PCP for bilateral lower extremity pain and swelling and felt to have peripheral neuropathy.  I do not appreciate any clinical symptoms to suggest a cellulitis, compartment syndrome or ischemic injury.  Amount and/or Complexity of Data Reviewed Radiology: ordered.    Details: Tib-fib x-ray without acute bony abnormality.  I have reviewed venous imaging from 4 days ago that was negative for DVT on the right Discussion of management or test interpretation with  external provider(s): Patient here with likely musculoskeletal injury of the left lower extremity.  She had reassuring x-ray, I do not feel her symptoms are related to DVT as she has a negative Homans' sign and she is anticoagulated on Xarelto.  Symptoms began acutely after a fall.  She is neurovascularly intact and compartments are soft.  She is agreeable to symptomatic treatment with RICE.  Given dose of pain medication here, currently under care of pain management and understands additional prescription pain medication is not indicated.  She will follow-up with PCP or orthopedics if symptoms not improving.  Return precautions discussed  Risk Prescription drug management.  Final Clinical Impression(s) / ED Diagnoses Final diagnoses:  Fall, initial encounter  Injury of left lower leg, initial encounter    Rx / DC Orders ED Discharge Orders     None         Pauline Aus, PA-C 09/03/22 1513    Mardene Sayer, MD 09/04/22 [phone]

## 2022-09-04 ENCOUNTER — Other Ambulatory Visit (HOSPITAL_COMMUNITY): Payer: Self-pay | Admitting: Specialist

## 2022-09-04 DIAGNOSIS — I82413 Acute embolism and thrombosis of femoral vein, bilateral: Secondary | ICD-10-CM

## 2022-09-07 ENCOUNTER — Ambulatory Visit (HOSPITAL_COMMUNITY)
Admission: RE | Admit: 2022-09-07 | Discharge: 2022-09-07 | Disposition: A | Discharge status: Home / Self Care | Payer: Medicare Other | Source: Ambulatory Visit | Attending: Internal Medicine | Admitting: Internal Medicine

## 2022-09-07 DIAGNOSIS — I82413 Acute embolism and thrombosis of femoral vein, bilateral: Secondary | ICD-10-CM | POA: Diagnosis not present

## 2022-09-11 NOTE — Telephone Encounter (Signed)
Records received from [facility]. Will place in Dr. Collie Siad folder for review

## 2022-09-14 NOTE — Progress Notes (Signed)
[facility] [place]  ASSESSMENT / PLAN: 66 y.o. female with history of DVT with plans to undergo TKA with Dr. Tonita Cong. He has requested an IVC filter. I counseled the patient about the risks / benefits / alternatives to IVC filter placement.  We will plan to place this 10/09/2022, unless Dr. Tonita Cong needs [place] placed sooner.  If he does need it sooner, I will ask for my partners to place it while I am away in the middle of October.  CHIEF COMPLAINT: Plan for knee replacement surgery  HISTORY OF PRESENT ILLNESS: Norma Anthony is a 66 y.o. female referred to clinic for discussion of IVC filter placement prior to orthopedic surgery.  The patient is planned to undergo total knee arthroplasty with Dr. Maxie Better in the near future.  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  She has been maintained on anticoagulation therapy.  She has plans to see a hematologist later this month.  Past Medical History:  Diagnosis Date   CAD (coronary artery disease)    COPD (chronic obstructive pulmonary disease) (HCC)    Depression    Fibromyalgia    GERD (gastroesophageal reflux disease)    Hiatal hernia    Migraine headache    Nephrolithiasis    Pulmonary embolism ([place]) 1996   Thyroid nodule     Past Surgical History:  Procedure Laterality Date   ABDOMINAL HYSTERECTOMY     BIOPSY THYROID     HERNIA REPAIR     KIDNEY STONE SURGERY     LAPAROSCOPIC NISSEN FUNDOPLICATION     PAROTIDECTOMY     TONSILLECTOMY      Family History  Problem Relation Age of Onset   Hypertension Mother     Social History   Socioeconomic History   Marital status: Married    Spouse name: Not on file   Number of children: Not on file   Years of education: Not on file   Highest education level: Not on file  Occupational History   Not on file  Tobacco Use   Smoking status: Former    Packs/day: 0.50    Years: 10.00    Total pack years: 5.00     Types: Cigarettes    Quit date: 02/12/2021    Years since quitting: 1.5   Smokeless tobacco: Never  Vaping Use   Vaping Use: Never used  Substance and Sexual Activity   Alcohol use: Not Currently   Drug use: Not Currently   Sexual activity: Not on file  Other Topics Concern   Not on file  Social History Narrative   Not on file   Social Determinants of Health   Financial Resource Strain: Not on file  Food Insecurity: Not on file  Transportation Needs: Not on file  Physical Activity: Not on file  Stress: Not on file  Social Connections: Not on file  Intimate Partner Violence: Not on file    Allergies  Allergen Reactions   Pregabalin Other (See Comments)    Other reaction(s): Paralysis present (finding) Other reaction(s): not tolerated Pt stated she could not move her body but she could think  Pt stated she could not move her body but she could think     Droperidol Anxiety    Other reaction(s): anxitey    Ergotamine-Caffeine Anxiety    Other reaction(s): Increased systolic arterial pressure (finding)    Prochlorperazine Anxiety and Other (See Comments)  Other reaction(s): anxiety     Current Outpatient Medications  Medication Sig Dispense Refill   albuterol (VENTOLIN HFA) 108 (90 Base) MCG/ACT inhaler Inhale into the lungs.     ALPRAZolam (XANAX) 1 MG tablet Take 1 mg by mouth at bedtime.     aspirin 81 MG EC tablet Take by mouth.     Aspirin-Salicylamide-Caffeine (BC HEADACHE POWDER PO) Take by mouth.     BELBUCA 450 MCG FILM Take by mouth 2 (two) times daily.     benzonatate (TESSALON) 100 MG capsule Take 1 capsule (100 mg total) by mouth every 8 (eight) hours. 21 capsule 0   Biotin w/ Vitamins C & E (HAIR/SKIN/NAILS PO) Take 1 tablet by mouth daily.     Cholecalciferol (VITAMIN D3) 1.25 MG (50000 UT) CAPS Take 1 capsule by mouth once a week.     clotrimazole-betamethasone (LOTRISONE) cream Apply topically 2 (two) times daily.     diphenhydrAMINE (BENADRYL)  25 mg capsule Take by mouth.     DULoxetine (CYMBALTA) 60 MG capsule Take 60 mg by mouth daily.     famotidine (PEPCID) 20 MG tablet One after supper 30 tablet 11   Ferrous Fumarate-Folic Acid 324-1 MG TABS Take 1 tablet by mouth daily.     furosemide (LASIX) 20 MG tablet Take 1 tablet (20 mg total) by mouth daily. 30 tablet 0   Meclizine HCl 25 MG CHEW Chew by mouth as needed.     Multiple Vitamins-Minerals (MULTIVITAMIN ADULTS PO) Take 1 tablet by mouth daily.     naloxone (NARCAN) nasal spray 4 mg/0.1 mL SMARTSIG:Both Nares     pantoprazole (PROTONIX) 40 MG tablet Take 40 mg by mouth daily.     promethazine (PHENERGAN) 25 MG tablet Take 25 mg by mouth every 6 (six) hours as needed.     XARELTO 20 MG TABS tablet Take 20 mg by mouth daily.     No current facility-administered medications for this visit.    PHYSICAL EXAM Vitals:   09/15/22 1116  BP: 135/86  Pulse: 89  Resp: 20  Temp: 98.2 F (36.8 C)  SpO2: 95%  Weight: 241 lb (109.3 kg)  Height: 5' 6" (1.676 m)   Well-appearing woman in no acute distress Regular rate and rhythm Unlabored breathing No edema in lower extremities  PERTINENT LABORATORY AND RADIOLOGIC DATA  Most recent CBC    Latest Ref Rng & Units 08/27/2022   10:06 AM 07/05/2022    2:31 PM 04/19/2022    4:08 PM  CBC  WBC 4.0 - 10.5 K/uL 9.1  8.1  6.4   Hemoglobin 12.0 - 15.0 g/dL 12.4  13.3  11.7   Hematocrit 36.0 - 46.0 % 40.2  42.6  37.5   Platelets 150 - 400 K/uL 140  175  163      Most recent CMP    Latest Ref Rng & Units 08/27/2022   10:06 AM 07/05/2022    2:31 PM 04/19/2022    4:08 PM  CMP  Glucose 70 - 99 mg/dL 89  90  92   BUN 8 - 23 mg/dL 16  14  16   Creatinine 0.44 - 1.00 mg/dL 0.81  0.79  0.74   Sodium 135 - 145 mmol/L 142  141  143   Potassium 3.5 - 5.1 mmol/L 3.8  3.7  4.2   Chloride 98 - 111 mmol/L 109  103  107   CO2 22 - 32 mmol/L 24  30  32   Calcium   8.9 - 10.3 mg/dL 8.7  8.7  8.6    Jacai Kipp N. Shanna Strength, MD [facility] [place] Office Phone Number: [phone] 09/15/2022 5:10 PM  Total time spent on preparing this encounter including chart review, data review, collecting history, examining the patient, coordinating care for this new patient, 45 minutes.  Portions of this report may have been transcribed using voice recognition software.  Every effort has been made to ensure accuracy; however, inadvertent computerized transcription errors may still be present.    

## 2022-09-14 NOTE — H&P (View-Only) (Signed)
[facility] [place]  ASSESSMENT / PLAN: 66 y.o. female with history of DVT with plans to undergo TKA with Dr. Tonita Cong. He has requested an IVC filter. I counseled the patient about the risks / benefits / alternatives to IVC filter placement.  We will plan to place this 10/09/2022, unless Dr. Tonita Cong needs [place] placed sooner.  If he does need it sooner, I will ask for my partners to place it while I am away in the middle of October.  CHIEF COMPLAINT: Plan for knee replacement surgery  HISTORY OF PRESENT ILLNESS: Norma Anthony is a 66 y.o. female referred to clinic for discussion of IVC filter placement prior to orthopedic surgery.  The patient is planned to undergo total knee arthroplasty with Dr. Maxie Better in the near future.  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  She has been maintained on anticoagulation therapy.  She has plans to see a hematologist later this month.  Past Medical History:  Diagnosis Date   CAD (coronary artery disease)    COPD (chronic obstructive pulmonary disease) (HCC)    Depression    Fibromyalgia    GERD (gastroesophageal reflux disease)    Hiatal hernia    Migraine headache    Nephrolithiasis    Pulmonary embolism ([place]) 1996   Thyroid nodule     Past Surgical History:  Procedure Laterality Date   ABDOMINAL HYSTERECTOMY     BIOPSY THYROID     HERNIA REPAIR     KIDNEY STONE SURGERY     LAPAROSCOPIC NISSEN FUNDOPLICATION     PAROTIDECTOMY     TONSILLECTOMY      Family History  Problem Relation Age of Onset   Hypertension Mother     Social History   Socioeconomic History   Marital status: Married    Spouse name: Not on file   Number of children: Not on file   Years of education: Not on file   Highest education level: Not on file  Occupational History   Not on file  Tobacco Use   Smoking status: Former    Packs/day: 0.50    Years: 10.00    Total pack years: 5.00     Types: Cigarettes    Quit date: 02/12/2021    Years since quitting: 1.5   Smokeless tobacco: Never  Vaping Use   Vaping Use: Never used  Substance and Sexual Activity   Alcohol use: Not Currently   Drug use: Not Currently   Sexual activity: Not on file  Other Topics Concern   Not on file  Social History Narrative   Not on file   Social Determinants of Health   Financial Resource Strain: Not on file  Food Insecurity: Not on file  Transportation Needs: Not on file  Physical Activity: Not on file  Stress: Not on file  Social Connections: Not on file  Intimate Partner Violence: Not on file    Allergies  Allergen Reactions   Pregabalin Other (See Comments)    Other reaction(s): Paralysis present (finding) Other reaction(s): not tolerated Pt stated she could not move her body but she could think  Pt stated she could not move her body but she could think     Droperidol Anxiety    Other reaction(s): anxitey    Ergotamine-Caffeine Anxiety    Other reaction(s): Increased systolic arterial pressure (finding)    Prochlorperazine Anxiety and Other (See Comments)  Other reaction(s): anxiety     Current Outpatient Medications  Medication Sig Dispense Refill   albuterol (VENTOLIN HFA) 108 (90 Base) MCG/ACT inhaler Inhale into the lungs.     ALPRAZolam (XANAX) 1 MG tablet Take 1 mg by mouth at bedtime.     aspirin 81 MG EC tablet Take by mouth.     Aspirin-Salicylamide-Caffeine (BC HEADACHE POWDER PO) Take by mouth.     BELBUCA 450 MCG FILM Take by mouth 2 (two) times daily.     benzonatate (TESSALON) 100 MG capsule Take 1 capsule (100 mg total) by mouth every 8 (eight) hours. 21 capsule 0   Biotin w/ Vitamins C & E (HAIR/SKIN/NAILS PO) Take 1 tablet by mouth daily.     Cholecalciferol (VITAMIN D3) 1.25 MG (50000 UT) CAPS Take 1 capsule by mouth once a week.     clotrimazole-betamethasone (LOTRISONE) cream Apply topically 2 (two) times daily.     diphenhydrAMINE (BENADRYL)  25 mg capsule Take by mouth.     DULoxetine (CYMBALTA) 60 MG capsule Take 60 mg by mouth daily.     famotidine (PEPCID) 20 MG tablet One after supper 30 tablet 11   Ferrous Fumarate-Folic Acid 324-1 MG TABS Take 1 tablet by mouth daily.     furosemide (LASIX) 20 MG tablet Take 1 tablet (20 mg total) by mouth daily. 30 tablet 0   Meclizine HCl 25 MG CHEW Chew by mouth as needed.     Multiple Vitamins-Minerals (MULTIVITAMIN ADULTS PO) Take 1 tablet by mouth daily.     naloxone (NARCAN) nasal spray 4 mg/0.1 mL SMARTSIG:Both Nares     pantoprazole (PROTONIX) 40 MG tablet Take 40 mg by mouth daily.     promethazine (PHENERGAN) 25 MG tablet Take 25 mg by mouth every 6 (six) hours as needed.     XARELTO 20 MG TABS tablet Take 20 mg by mouth daily.     No current facility-administered medications for this visit.    PHYSICAL EXAM Vitals:   09/15/22 1116  BP: 135/86  Pulse: 89  Resp: 20  Temp: 98.2 F (36.8 C)  SpO2: 95%  Weight: 241 lb (109.3 kg)  Height: 5\' 6"  (1.676 m)   Well-appearing woman in no acute distress Regular rate and rhythm Unlabored breathing No edema in lower extremities  PERTINENT LABORATORY AND RADIOLOGIC DATA  Most recent CBC    Latest Ref Rng & Units 08/27/2022   10:06 AM 07/05/2022    2:31 PM 04/19/2022    4:08 PM  CBC  WBC 4.0 - 10.5 K/uL 9.1  8.1  6.4   Hemoglobin 12.0 - 15.0 g/dL 06/19/2022  41.2  87.8   Hematocrit 36.0 - 46.0 % 40.2  42.6  37.5   Platelets 150 - 400 K/uL 140  175  163      Most recent CMP    Latest Ref Rng & Units 08/27/2022   10:06 AM 07/05/2022    2:31 PM 04/19/2022    4:08 PM  CMP  Glucose 70 - 99 mg/dL 89  90  92   BUN 8 - 23 mg/dL 16  14  16    Creatinine 0.44 - 1.00 mg/dL 06/19/2022   7.20   Sodium 135 - 145 mmol/L 142  141  143   Potassium 3.5 - 5.1 mmol/L 3.8  3.7  4.2   Chloride 98 - 111 mmol/L 109  103  107   CO2 22 - 32 mmol/L 24  30  32   Calcium  8.9 - 10.3 mg/dL 8.7  8.7  8.6    Mariangela Heldt N. Lenell Antu, MD [facility] Phone Number: [phone] 09/15/2022 5:10 PM  Total time spent on preparing this encounter including chart review, data review, collecting history, examining the patient, coordinating care for this new patient, 45 minutes.  Portions of this report may have been transcribed using voice recognition software.  Every effort has been made to ensure accuracy; however, inadvertent computerized transcription errors may still be present.

## 2022-09-15 ENCOUNTER — Encounter: Payer: Self-pay | Admitting: *Deleted

## 2022-09-15 ENCOUNTER — Ambulatory Visit (INDEPENDENT_AMBULATORY_CARE_PROVIDER_SITE_OTHER): Payer: Medicare Other | Admitting: Vascular Surgery

## 2022-09-15 ENCOUNTER — Encounter: Payer: Self-pay | Admitting: Vascular Surgery

## 2022-09-15 ENCOUNTER — Other Ambulatory Visit: Payer: Self-pay

## 2022-09-15 VITALS — BP 135/86 | HR 89 | Temp 98.2°F | Resp 20 | Ht 66.0 in | Wt 241.0 lb

## 2022-09-15 DIAGNOSIS — Z86711 Personal history of pulmonary embolism: Secondary | ICD-10-CM

## 2022-09-15 DIAGNOSIS — Z86718 Personal history of other venous thrombosis and embolism: Secondary | ICD-10-CM

## 2022-09-25 ENCOUNTER — Encounter: Payer: Self-pay | Admitting: Hematology

## 2022-09-25 ENCOUNTER — Inpatient Hospital Stay: Payer: Medicare Other | Attending: Hematology | Admitting: Hematology

## 2022-09-25 DIAGNOSIS — Z79899 Other long term (current) drug therapy: Secondary | ICD-10-CM | POA: Diagnosis not present

## 2022-09-25 DIAGNOSIS — Z87891 Personal history of nicotine dependence: Secondary | ICD-10-CM | POA: Diagnosis not present

## 2022-09-25 DIAGNOSIS — Z7982 Long term (current) use of aspirin: Secondary | ICD-10-CM | POA: Insufficient documentation

## 2022-09-25 DIAGNOSIS — Z803 Family history of malignant neoplasm of breast: Secondary | ICD-10-CM | POA: Diagnosis not present

## 2022-09-25 DIAGNOSIS — Z86718 Personal history of other venous thrombosis and embolism: Secondary | ICD-10-CM | POA: Diagnosis not present

## 2022-09-25 DIAGNOSIS — Z86711 Personal history of pulmonary embolism: Secondary | ICD-10-CM

## 2022-09-25 DIAGNOSIS — Z7901 Long term (current) use of anticoagulants: Secondary | ICD-10-CM | POA: Diagnosis not present

## 2022-09-25 DIAGNOSIS — Z801 Family history of malignant neoplasm of trachea, bronchus and lung: Secondary | ICD-10-CM | POA: Insufficient documentation

## 2022-09-25 NOTE — Progress Notes (Signed)
CONSULT NOTE  Patient Care Team: Clinic, [facility] as PCP - General Derek Jack, MD as Medical Oncologist (Hematology)  CHIEF COMPLAINTS/PURPOSE OF CONSULTATION:  History of DVT and PE  HISTORY OF PRESENTING ILLNESS:  Norma Anthony 66 y.o. female is seen in consultation today at the request of Dr. Ardyth Gal.  This patient had provoked pulmonary embolism in 1996 in [place], while she was on birth control pills and took Coumadin for 6 months.  In 2020, she developed unprovoked right leg DVT in [facility] and has been on Xarelto since then.  She is now being planned for left total knee replacement surgery.  She met with the vascular surgery and an IVC filter is being planned on Monday.  She is tolerating Xarelto without any problems.  She had extensive family history of thromboembolism with father who died of pulmonary embolism.  Mother had multiple DVTs and her daughter had multiple DVTs and is on Eliquis indefinitely.  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    MEDICAL HISTORY:  Past Medical History:  Diagnosis Date   CAD (coronary artery disease)    COPD (chronic obstructive pulmonary disease) (HCC)    Depression    Fibromyalgia    GERD (gastroesophageal reflux disease)    Hiatal hernia    Migraine headache    Nephrolithiasis    Pulmonary embolism ([place]) 1996   Thyroid nodule     SURGICAL HISTORY: Past Surgical History:  Procedure Laterality Date   ABDOMINAL HYSTERECTOMY     BIOPSY THYROID     HERNIA REPAIR     KIDNEY STONE SURGERY     LAPAROSCOPIC NISSEN FUNDOPLICATION     PAROTIDECTOMY     TONSILLECTOMY      SOCIAL HISTORY: Social History   Socioeconomic History   Marital status: Married    Spouse name: Not on file   Number of children: Not on file   Years of education: Not on file   Highest education level: Not on file  Occupational History   Not on file  Tobacco Use   Smoking status: Former     Packs/day: 0.50    Years: 10.00    Total pack years: 5.00    Types: Cigarettes    Quit date: 02/12/2021    Years since quitting: 1.6   Smokeless tobacco: Never  Vaping Use   Vaping Use: Never used  Substance and Sexual Activity   Alcohol use: Not Currently   Drug use: Not Currently   Sexual activity: Not on file  Other Topics Concern   Not on file  Social History Narrative   Not on file   Social Determinants of Health   Financial Resource Strain: Not on file  Food Insecurity: Not on file  Transportation Needs: Not on file  Physical Activity: Not on file  Stress: Not on file  Social Connections: Not on file  Intimate Partner Violence: Not on file    FAMILY HISTORY: Family History  Problem Relation Age of Onset   Hypertension Mother     ALLERGIES:  is allergic to lyrica [pregabalin], compazine [prochlorperazine], droperidol, and ergotamine-caffeine.  MEDICATIONS:  Current Outpatient Medications  Medication Sig Dispense Refill   albuterol (VENTOLIN HFA) 108 (90 Base) MCG/ACT inhaler Inhale 2 puffs into the lungs every 6 (six) hours as needed for wheezing or shortness of breath.     ALPRAZolam (XANAX) 1 MG tablet Take 1 mg by mouth 4 (four) times daily as needed for anxiety.  aspirin 81 MG EC tablet Take 81 mg by mouth daily.     Aspirin-Acetaminophen (GOODYS BACK & BODY PAIN) 500-325 MG PACK Take 1 packet by mouth 2 (two) times daily as needed (pain).     b complex vitamins capsule Take 1 capsule by mouth daily.     benzonatate (TESSALON) 100 MG capsule Take 1 capsule (100 mg total) by mouth every 8 (eight) hours. 21 capsule 0   Biotin w/ Vitamins C & E (HAIR/SKIN/NAILS PO) Take 1 tablet by mouth daily.     Buprenorphine HCl (BELBUCA) 450 MCG FILM Place 450 mcg inside cheek in the morning and at bedtime.     Cholecalciferol (VITAMIN D3) 1.25 MG (50000 UT) CAPS Take 50,000 Units by mouth every Wednesday.     clindamycin-benzoyl peroxide (BENZACLIN) gel Apply 1  application  topically 2 (two) times daily.     clotrimazole-betamethasone (LOTRISONE) cream Apply 1 Application topically 2 (two) times daily as needed (irritation).     diphenhydrAMINE (BENADRYL) 25 mg capsule Take 25 mg by mouth daily as needed for allergies.     DULoxetine (CYMBALTA) 60 MG capsule Take 60 mg by mouth daily.     famotidine (PEPCID) 20 MG tablet One after supper 30 tablet 11   Ferrous Fumarate-Folic Acid 517-0 MG TABS Take 1 tablet by mouth daily.     furosemide (LASIX) 20 MG tablet Take 1 tablet (20 mg total) by mouth daily. 30 tablet 0   gabapentin (NEURONTIN) 300 MG capsule Take 300 mg by mouth 3 (three) times daily.     ibuprofen (ADVIL) 200 MG tablet Take 400 mg by mouth every 8 (eight) hours as needed for moderate pain.     Meclizine HCl 25 MG CHEW Chew 25 mg by mouth 3 (three) times daily as needed (dizziness).     Melatonin 5 MG CHEW Chew 5 mg by mouth at bedtime.     Multiple Vitamins-Minerals (MULTIVITAMIN GUMMIES ADULT PO) Take 2 each by mouth daily.     naloxone (NARCAN) nasal spray 4 mg/0.1 mL Place 1 spray into the nose once as needed (accidental over dose).     pantoprazole (PROTONIX) 40 MG tablet Take 40 mg by mouth daily.     XARELTO 20 MG TABS tablet Take 20 mg by mouth daily.     promethazine (PHENERGAN) 25 MG tablet Take 25 mg by mouth every 6 (six) hours as needed for nausea or vomiting. (Patient not taking: Reported on 09/25/2022)     No current facility-administered medications for this visit.    REVIEW OF SYSTEMS:   Constitutional: Denies fevers, chills or abnormal night sweats Eyes: Denies blurriness of vision, double vision or watery eyes Ears, nose, mouth, throat, and face: Denies mucositis or sore throat Respiratory: Denies cough, dyspnea or wheezes Cardiovascular: Denies palpitation, chest discomfort or lower extremity swelling Gastrointestinal: Positive for constipation. Skin: Denies abnormal skin rashes Lymphatics: Denies new  lymphadenopathy or easy bruising Neurological: Positive for numbness in the hands and legs.  Positive for chronic back pain. Behavioral/Psych: Mood is stable, no new changes  All other systems were reviewed with the patient and are negative.  PHYSICAL EXAMINATION: ECOG PERFORMANCE STATUS: 1 - Symptomatic but completely ambulatory  There were no vitals filed for this visit. There were no vitals filed for this visit.  GENERAL:alert, no distress and comfortable SKIN: skin color, texture, turgor are normal, no rashes or significant lesions EYES: normal, conjunctiva are pink and non-injected, sclera clear OROPHARYNX:no exudate, no erythema and lips,  buccal mucosa, and tongue normal  NECK: supple, thyroid normal size, non-tender, without nodularity LYMPH:  no palpable lymphadenopathy in the cervical, axillary or inguinal LUNGS: clear to auscultation and percussion with normal breathing effort HEART: regular rate & rhythm and no murmurs and no lower extremity edema ABDOMEN:abdomen soft, non-tender and normal bowel sounds Musculoskeletal:no cyanosis of digits and no clubbing  PSYCH: alert & oriented x 3 with fluent speech NEURO: no focal motor/sensory deficits  LABORATORY DATA:  I have reviewed the data as listed Recent Results (from the past 2160 hour(s))  TSH     Status: None   Collection Time: 07/02/22  3:24 PM  Result Value Ref Range   TSH 1.160 0.450 - 4.500 uIU/mL  T4, free     Status: None   Collection Time: 07/02/22  3:24 PM  Result Value Ref Range   Free T4 0.89 0.82 - 1.77 ng/dL  T3, free     Status: None   Collection Time: 07/02/22  3:24 PM  Result Value Ref Range   T3, Free 2.9 2.0 - 4.4 pg/mL  Thyroid peroxidase antibody     Status: None   Collection Time: 07/02/22  3:24 PM  Result Value Ref Range   Thyroperoxidase Ab SerPl-aCnc <9 0 - 34 IU/mL  Thyroglobulin antibody     Status: None   Collection Time: 07/02/22  3:24 PM  Result Value Ref Range   Thyroglobulin  Antibody <1.0 0.0 - 0.9 IU/mL    Comment: Thyroglobulin Antibody measured by USAA Methodology  Basic metabolic panel     Status: Abnormal   Collection Time: 07/05/22  2:31 PM  Result Value Ref Range   Sodium 141 135 - 145 mmol/L   Potassium 3.7 3.5 - 5.1 mmol/L   Chloride 103 98 - 111 mmol/L   CO2 30 22 - 32 mmol/L   Glucose, Bld 90 70 - 99 mg/dL    Comment: Glucose reference range applies only to samples taken after fasting for at least 8 hours.   BUN 14 8 - 23 mg/dL   Creatinine, Ser 0.79 0.44 - 1.00 mg/dL   Calcium 8.7 (L) 8.9 - 10.3 mg/dL   GFR, Estimated >60 >60 mL/min    Comment: (NOTE) Calculated using the CKD-EPI Creatinine Equation (2021)    Anion gap 8 5 - 15    Comment: Performed at [facility], [address] [place], [place] [address]  CBC with Differential     Status: Abnormal   Collection Time: 07/05/22  2:31 PM  Result Value Ref Range   WBC 8.1 4.0 - 10.5 K/uL   RBC 4.22 3.87 - 5.11 MIL/uL   Hemoglobin 13.3 12.0 - 15.0 g/dL   HCT 42.6 36.0 - 46.0 %   MCV 100.9 (H) 80.0 - 100.0 fL   MCH 31.5 26.0 - 34.0 pg   MCHC 31.2 30.0 - 36.0 g/dL   RDW 14.0 11.5 - 15.5 %   Platelets 175 150 - 400 K/uL   nRBC 0.0 0.0 - 0.2 %   Neutrophils Relative % 55 %   Neutro Abs 4.4 1.7 - 7.7 K/uL   Lymphocytes Relative 33 %   Lymphs Abs 2.7 0.7 - 4.0 K/uL   Monocytes Relative 8 %   Monocytes Absolute 0.6 0.1 - 1.0 K/uL   Eosinophils Relative 3 %   Eosinophils Absolute 0.3 0.0 - 0.5 K/uL   Basophils Relative 1 %   Basophils Absolute 0.1 0.0 - 0.1 K/uL   Immature Granulocytes 0 %  Abs Immature Granulocytes 0.02 0.00 - 0.07 K/uL    Comment: Performed at [facility], [address] [place], [place] [address]  Resp Panel by RT-PCR (Flu A&B, Covid) Anterior Nasal Swab     Status: None   Collection Time: 07/05/22  2:37 PM   Specimen: Anterior Nasal Swab  Result Value Ref Range   SARS Coronavirus 2 by RT PCR NEGATIVE NEGATIVE    Comment: (NOTE) SARS-CoV-2 target  nucleic acids are NOT DETECTED.  The SARS-CoV-2 RNA is generally detectable in upper respiratory specimens during the acute phase of infection. The lowest concentration of SARS-CoV-2 viral copies this assay can detect is 138 copies/mL. A negative result does not preclude SARS-Cov-2 infection and should not be used as the sole basis for treatment or other patient management decisions. A negative result may occur with  improper specimen collection/handling, submission of specimen other than nasopharyngeal swab, presence of viral mutation(s) within the areas targeted by this assay, and inadequate number of viral copies(<138 copies/mL). A negative result must be combined with clinical observations, patient history, and epidemiological information. The expected result is Negative.  Fact Sheet for Patients:  EntrepreneurPulse.com.au  Fact Sheet for Healthcare Providers:  IncredibleEmployment.be  This test is no t yet approved or cleared by the [place] FDA and  has been authorized for detection and/or diagnosis of SARS-CoV-2 by FDA under an Emergency Use Authorization (EUA). This EUA will remain  in effect (meaning this test can be used) for the duration of the COVID-19 declaration under Section 564(b)(1) of the Act, 21 U.S.C.section 360bbb-3(b)(1), unless the authorization is terminated  or revoked sooner.       Influenza A by PCR NEGATIVE NEGATIVE   Influenza B by PCR NEGATIVE NEGATIVE    Comment: (NOTE) The Xpert Xpress SARS-CoV-2/FLU/RSV plus assay is intended as an aid in the diagnosis of influenza from Nasopharyngeal swab specimens and should not be used as a sole basis for treatment. Nasal washings and aspirates are unacceptable for Xpert Xpress SARS-CoV-2/FLU/RSV testing.  Fact Sheet for Patients: EntrepreneurPulse.com.au  Fact Sheet for Healthcare Providers: IncredibleEmployment.be  This test  is not yet approved or cleared by the [place] FDA and has been authorized for detection and/or diagnosis of SARS-CoV-2 by FDA under an Emergency Use Authorization (EUA). This EUA will remain in effect (meaning this test can be used) for the duration of the COVID-19 declaration under Section 564(b)(1) of the Act, 21 U.S.C. section 360bbb-3(b)(1), unless the authorization is terminated or revoked.  Performed at [facility], [address], [place], [place] [address]   Cytology - Non PAP;     Status: None   Collection Time: 07/09/22 10:56 AM  Result Value Ref Range   CYTOLOGY - NON GYN      CYTOLOGY - NON PAP CASE: [id number] PATIENT: Deazia Lottes Non-Gynecological Cytology Report     Clinical History: 2.1 cm RML Specimen Submitted:  A. THYROID, RML, FINE NEEDLE ASPIRATION:   FINAL MICROSCOPIC DIAGNOSIS: - Consistent with benign follicular nodule (Bethesda category II) Adequate normal follicular cells are present mixed with colloid and rare lymphocytes.  SPECIMEN ADEQUACY: Satisfactory for evaluation  GROSS: Received is/are 6 slides in 95% Ethyl alcohol and 30 ccs of pale pink Cytolyt solution. (CM:cm) Prepared: Smears:  6 Concentration Method (Thin Prep):  1 Cell Block:  Cell block attempted, not obtained. Additional Studies:  Also there was an Afirma collected.     Final Diagnosis performed by Unknown Jim, MD.   Electronically signed 07/10/2022 Technical component  performed at [facility], [place] [address], [place], [place] [address].  Professional component performed at [employer]. [facility], [place] [address] [place], [place] [address].  Immunohistochemistry Technical component (if applicable) was performed at [facility]. [address] [place], [place], [place] [address].   IMMUNOHISTOCHEMISTRY DISCLAIMER (if applicable): Some of these immunohistochemical stains may have been developed and the performance  characteristics determine by [facility]. Some may not have been cleared or approved by the U.S. Food and Drug Administration. The FDA has determined that such clearance or approval is not necessary. This test is used for clinical purposes. It should not be regarded as investigational or for research. This laboratory is certified under the [place] (CLIA-88) as qualified to perform high complexity clinical laboratory testing.  The controls stained appropriately.   CBC with Differential     Status: Abnormal   Collection Time: 08/27/22 10:06 AM  Result Value Ref Range   WBC 9.1 4.0 - 10.5 K/uL   RBC 3.86 (L) 3.87 - 5.11 MIL/uL   Hemoglobin 12.4 12.0 - 15.0 g/dL   HCT 40.2 36.0 - 46.0 %   MCV 104.1 (H) 80.0 - 100.0 fL   MCH 32.1 26.0 - 34.0 pg   MCHC 30.8 30.0 - 36.0 g/dL   RDW 14.0 11.5 - 15.5 %   Platelets 140 (L) 150 - 400 K/uL    Comment: SPECIMEN CHECKED FOR CLOTS REPEATED TO VERIFY    nRBC 0.0 0.0 - 0.2 %   Neutrophils Relative % 72 %   Neutro Abs 6.6 1.7 - 7.7 K/uL   Lymphocytes Relative 18 %   Lymphs Abs 1.6 0.7 - 4.0 K/uL   Monocytes Relative 6 %   Monocytes Absolute 0.5 0.1 - 1.0 K/uL   Eosinophils Relative 3 %   Eosinophils Absolute 0.3 0.0 - 0.5 K/uL   Basophils Relative 1 %   Basophils Absolute 0.1 0.0 - 0.1 K/uL   Immature Granulocytes 0 %   Abs Immature Granulocytes 0.02 0.00 - 0.07 K/uL    Comment: Performed at [facility], [address] [place], [place] [address]  Basic metabolic panel     Status: Abnormal   Collection Time: 08/27/22 10:06 AM  Result Value Ref Range   Sodium 142 135 - 145 mmol/L   Potassium 3.8 3.5 - 5.1 mmol/L   Chloride 109 98 - 111 mmol/L   CO2 24 22 - 32 mmol/L   Glucose, Bld 89 70 - 99 mg/dL    Comment: Glucose reference range applies only to samples taken after fasting for at least 8 hours.   BUN 16 8 - 23 mg/dL   Creatinine, Ser 0.81 0.44 - 1.00 mg/dL   Calcium 8.7 (L) 8.9 -  10.3 mg/dL   GFR, Estimated >60 >60 mL/min    Comment: (NOTE) Calculated using the CKD-EPI Creatinine Equation (2021)    Anion gap 9 5 - 15    Comment: Performed at [facility], [address] [place], [place] [address]  Brain natriuretic peptide     Status: None   Collection Time: 08/27/22 10:28 AM  Result Value Ref Range   B Natriuretic Peptide 21.0 0.0 - 100.0 pg/mL    Comment: Performed at [facility], [address], [place], [place] [address]    RADIOGRAPHIC STUDIES: I have personally reviewed the radiological images as listed and agreed with the findings in the report. VAS [place] LOWER EXTREMITY VENOUS (DVT)  Result Date: 09/08/2022  Lower Venous DVT Study Patient Name:  SHYANNE MCCLARY  Date of Exam:   09/07/2022 Medical Rec #: [id number]   Accession #:    [id number] Date of Birth: 1956/04/10   Patient Gender: F Patient Age:   47 years Exam Location:  [facility] Procedure:      VAS [place] LOWER EXTREMITY VENOUS (DVT) Referring Phys: JEFFREY BEANE --------------------------------------------------------------------------------  Indications: Patient reports increase lower leg swelling and pain, right worse than left. She states the pain is similar to what it was when she had the DVT about 2 years ago. She reports having a h/o fibromyalgia. She denies SOB.  Risk Factors: History of PE about 2 years ago per patient DVT history in the right lower extremity per patient. Anticoagulation: Xarelto. Comparison Study: On 08/27/2022, a lower venous duplex of the right lower                   extremity performed at [facility] showed no evidence                   of DVT.                    Venous duplex performed at [facility] on 04/04/2012 showed                   no evidence of thrombus in the bilateral lower extremities. Performing Technologist: Sharlett Iles RVT  Examination Guidelines: A complete evaluation includes B-mode imaging, spectral Doppler, color Doppler, and power Doppler as needed of all  accessible portions of each vessel. Bilateral testing is considered an integral part of a complete examination. Limited examinations for reoccurring indications may be performed as noted. The reflux portion of the exam is performed with the patient in reverse Trendelenburg.  +---------+---------------+---------+-----------+---------------+-------------+ RIGHT    CompressibilityPhasicitySpontaneityProperties     Thrombus                                                                 Aging         +---------+---------------+---------+-----------+---------------+-------------+ CFV      Full           Yes      Yes                                     +---------+---------------+---------+-----------+---------------+-------------+ SFJ      Full           Yes      Yes                                     +---------+---------------+---------+-----------+---------------+-------------+ FV Prox  Partial        Yes      No         brightly       Chronic  echogenic                    +---------+---------------+---------+-----------+---------------+-------------+ FV Mid   Full           Yes      Yes                                     +---------+---------------+---------+-----------+---------------+-------------+ FV DistalPartial        Yes      Yes        brightly       Chronic                                                   echogenic                    +---------+---------------+---------+-----------+---------------+-------------+ PFV      Full                    Yes                                     +---------+---------------+---------+-----------+---------------+-------------+ POP      Partial        Yes      Yes        brightly       Chronic                                                   echogenic                     +---------+---------------+---------+-----------+---------------+-------------+ PTV      Full                                                            +---------+---------------+---------+-----------+---------------+-------------+ PERO     Full                                                            +---------+---------------+---------+-----------+---------------+-------------+ Gastroc  Full                                                            +---------+---------------+---------+-----------+---------------+-------------+ GSV      Full           Yes      Yes                                     +---------+---------------+---------+-----------+---------------+-------------+  SSV                     Yes      Yes                                     +---------+---------------+---------+-----------+---------------+-------------+   Right Technical Findings: Limited visualization of the calf veins due to increase lower leg swelling. No evidence of thrombus in the TPT. Chronic non occlusive thrombus noted in the proximal and distal femoral veins and popliteal vein. No evidence of venous insuffiencey.  +---------+---------------+---------+-----------+----------+--------------+ LEFT     CompressibilityPhasicitySpontaneityPropertiesThrombus Aging +---------+---------------+---------+-----------+----------+--------------+ CFV      Full           Yes      Yes                                 +---------+---------------+---------+-----------+----------+--------------+ SFJ      Full           Yes      Yes                                 +---------+---------------+---------+-----------+----------+--------------+ FV Prox  Full           Yes      Yes                                 +---------+---------------+---------+-----------+----------+--------------+ FV Mid   Full                                                         +---------+---------------+---------+-----------+----------+--------------+ FV DistalFull           Yes      Yes                                 +---------+---------------+---------+-----------+----------+--------------+ PFV      Full                    Yes                                 +---------+---------------+---------+-----------+----------+--------------+ POP      Full           Yes      Yes                                 +---------+---------------+---------+-----------+----------+--------------+ PTV      Full                                                        +---------+---------------+---------+-----------+----------+--------------+ PERO     Full                                                        +---------+---------------+---------+-----------+----------+--------------+  Gastroc  Full                                                        +---------+---------------+---------+-----------+----------+--------------+ GSV      Full           Yes      Yes                                 +---------+---------------+---------+-----------+----------+--------------+ SSV                     Yes      Yes                                 +---------+---------------+---------+-----------+----------+--------------+     Summary: RIGHT: - Findings consistent with chronic deep vein thrombosis involving the right femoral vein, and right popliteal vein. - No cystic structure found in the popliteal fossa. - No evidence of venous insuffiencey.  LEFT: - No evidence of deep vein thrombosis in the lower extremity. No indirect evidence of obstruction proximal to the inguinal ligament. - No evidence of venous insuffiencey.  *See table(s) above for measurements and observations. Electronically signed by Ida Rogue MD on 09/08/2022 at 2:57:36 PM.    Final    DG Tibia/Fibula Left  Result Date: 08/31/2022 CLINICAL DATA:  Left lower leg pain after fall EXAM: LEFT TIBIA  AND FIBULA - 2 VIEW COMPARISON:  None Available. FINDINGS: There is no evidence of fracture or other focal bone lesions. No dislocation. Lateral compartment osteoarthritis of the left knee. Soft tissues are unremarkable. IMPRESSION: 1. No acute osseous abnormality of the left tibia and fibula. 2. Lateral compartment osteoarthritis of the left knee. Electronically Signed   By: Davina Poke D.O.   On: 08/31/2022 11:54   US Venous Img Lower Unilateral Right  Result Date: 08/27/2022 CLINICAL DATA:  Lower extremity swelling EXAM: LEFT LOWER EXTREMITY VENOUS DOPPLER ULTRASOUND TECHNIQUE: Gray-scale sonography with graded compression, as well as color Doppler and duplex ultrasound were performed to evaluate the lower extremity deep venous systems from the level of the common femoral vein and including the common femoral, femoral, profunda femoral, popliteal and calf veins including the posterior tibial, peroneal and gastrocnemius veins when visible. The superficial great saphenous vein was also interrogated. Spectral Doppler was utilized to evaluate flow at rest and with distal augmentation maneuvers in the common femoral, femoral and popliteal veins. COMPARISON:  None Available. FINDINGS: Contralateral Common Femoral Vein: Respiratory phasicity is normal and symmetric with the symptomatic side. No evidence of thrombus. Normal compressibility. Common Femoral Vein: No evidence of thrombus. Normal compressibility, respiratory phasicity and response to augmentation. Saphenofemoral Junction: No evidence of thrombus. Normal compressibility and flow on color Doppler imaging. Profunda Femoral Vein: No evidence of thrombus. Normal compressibility and flow on color Doppler imaging. Femoral Vein: No evidence of thrombus. Normal compressibility, respiratory phasicity and response to augmentation. Popliteal Vein: No evidence of thrombus. Normal compressibility, respiratory phasicity and response to augmentation. Calf Veins: No  evidence of thrombus within limits of the exam due to edema. Normal flow on color Doppler imaging. Other Findings:  Subcutaneous edema within the calf. IMPRESSION: No evidence of deep venous thrombosis.  Electronically Signed   By: Albin Felling M.D.   On: 08/27/2022 11:26   DG Chest Portable 1 View  Result Date: 08/27/2022 CLINICAL DATA:  Bilateral leg swelling EXAM: PORTABLE CHEST 1 VIEW COMPARISON:  07/05/2022 FINDINGS: Left lower lobe airspace disease. No pleural effusion or pneumothorax. Stable cardiomediastinal silhouette. Moderate-sized hiatal hernia. No acute osseous abnormality. IMPRESSION: 1. Left lower lobe airspace disease concerning for atelectasis versus pneumonia. Electronically Signed   By: Kathreen Devoid M.D.   On: 08/27/2022 10:56    ASSESSMENT:  1.  Recurrent DVT and PE: - First episode of weakly provoked pulmonary embolism in 1996, while on birth control pills.  Treated with Coumadin for 6 months. - Unprovoked right leg DVT in 2020 diagnosed in [facility], has been on Xarelto since then. - Now being planned for left TKR. - Met with vascular surgery for an IVC filter placement on 09/28/2022.  2.  Social/family history: - She worked as a Garment/textile technologist prior to retirement.  Quit smoking in March 2022.  Smoked 1 pack/day for 10 years. - Father died of pulmonary embolism.  Mother had multiple DVTs.  Daughter had initial DVT when she was pregnant followed by 3 more DVTs and is on Eliquis indefinitely.  Maternal great aunt had breast cancer.  Maternal first cousin had lung cancer and was smoker.    PLAN:  1.  Recurrent DVT and PE: - She is being planned for IVC filter placement on 09/28/2022. - For high bleeding risk procedure like TKR, recommend discontinuing Xarelto 2 days prior to the day of surgery. - Xarelto can be resumed postoperatively when hemostasis has been achieved, in general on POD 2. - She will continue Xarelto indefinitely.  I will be glad to  see her on an as-needed basis.   All questions were answered. The patient knows to call the clinic with any problems, questions or concerns.      Derek Jack, MD 09/25/22 2:28 PM

## 2022-09-25 NOTE — Patient Instructions (Addendum)
[facility]  Discharge Instructions  You were seen and examined today by Dr. Delton Coombes. Dr. Delton Coombes is a hematologist, meaning that he specializes in blood abnormalities. Dr. Delton Coombes discussed your past medical history, family history of cancers/blood conditions and the events that led to you being here today.  You were referred to Dr. Delton Coombes due to repeated blood clots. Dr. Tonita Cong sent you to Dr. Delton Coombes for his recommendation regarding ongoing blood thinners.  Because you have had multiple blood clots and due to your family history of clots, please remain on the Eliquis.  There is no need for additional labs today.  Follow-up as needed.  Thank you for choosing [facility] to provide your oncology and hematology care.   To afford each patient quality time with our provider, please arrive at least 15 minutes before your scheduled appointment time. You may need to reschedule your appointment if you arrive late (10 or more minutes). Arriving late affects you and other patients whose appointments are after yours.  Also, if you miss three or more appointments without notifying the office, you may be dismissed from the clinic at the provider's discretion.    Again, thank you for choosing [facility].  Our Jul[place] is that these requests will decrease the amount of time that you wait before being seen by our physicians.   If you have a lab appointment with the [facility] please come in thru the Main Entrance and check in at the main information desk.           _____________________________________________________________  Should you have questions after your visit to [facility], please contact our office at [phone] and follow the prompts.  Our office hours are 8:00 a.m. to 4:30 p.m. Monday - Thursday and 8:00 a.m. to 2:30 p.m. Friday.  Please note that voicemails left after 4:00 p.m. may not be  returned until the following business day.  We are closed weekends and all major holidays.  You do have access to a nurse 24-7, just call the main number to the clinic [phone] and do not press any options, hold on the line and a nurse will answer the phone.    For prescription refill requests, have your pharmacy contact our office and allow 72 hours.    Masks are optional in the cancer centers. If you would like for your care team to wear a mask while they are taking care of you, please let them know. You may have one support person who is at least 65 years old accompany you for your appointments.

## 2022-09-28 ENCOUNTER — Other Ambulatory Visit: Payer: Self-pay

## 2022-09-28 ENCOUNTER — Encounter (HOSPITAL_COMMUNITY): Payer: Self-pay | Admitting: Vascular Surgery

## 2022-09-28 ENCOUNTER — Encounter (HOSPITAL_COMMUNITY)
Admission: RE | Disposition: A | Discharge status: Home / Self Care | Payer: Self-pay | Source: Home / Self Care | Attending: Vascular Surgery

## 2022-09-28 ENCOUNTER — Ambulatory Visit (HOSPITAL_COMMUNITY)
Admission: RE | Admit: 2022-09-28 | Discharge: 2022-09-28 | Disposition: A | Discharge status: Home / Self Care | Payer: Medicare Other | Attending: Vascular Surgery | Admitting: Vascular Surgery

## 2022-09-28 DIAGNOSIS — Z409 Encounter for prophylactic surgery, unspecified: Secondary | ICD-10-CM | POA: Insufficient documentation

## 2022-09-28 DIAGNOSIS — Z86718 Personal history of other venous thrombosis and embolism: Secondary | ICD-10-CM

## 2022-09-28 DIAGNOSIS — M25569 Pain in unspecified knee: Secondary | ICD-10-CM

## 2022-09-28 DIAGNOSIS — Z7901 Long term (current) use of anticoagulants: Secondary | ICD-10-CM | POA: Insufficient documentation

## 2022-09-28 DIAGNOSIS — Z86711 Personal history of pulmonary embolism: Secondary | ICD-10-CM

## 2022-09-28 HISTORY — PX: IVC FILTER INSERTION: CATH118245

## 2022-09-28 LAB — POCT I-STAT, CHEM 8
BUN: 14 mg/dL (ref 8–23)
Calcium, Ion: 1.17 mmol/L (ref 1.15–1.40)
Chloride: 101 mmol/L (ref 98–111)
Creatinine, Ser: 0.8 mg/dL (ref 0.44–1.00)
Glucose, Bld: 88 mg/dL (ref 70–99)
HCT: 40 % (ref 36.0–46.0)
Hemoglobin: 13.6 g/dL (ref 12.0–15.0)
Potassium: 4.7 mmol/L (ref 3.5–5.1)
Sodium: 142 mmol/L (ref 135–145)
TCO2: 34 mmol/L — ABNORMAL HIGH (ref 22–32)

## 2022-09-28 LAB — POCT I-STAT EG7
Acid-base deficit: 3 mmol/L — ABNORMAL HIGH (ref 0.0–2.0)
Bicarbonate: 23.9 mmol/L (ref 20.0–28.0)
Calcium, Ion: 0.95 mmol/L — ABNORMAL LOW (ref 1.15–1.40)
HCT: 28 % — ABNORMAL LOW (ref 36.0–46.0)
Hemoglobin: 9.5 g/dL — ABNORMAL LOW (ref 12.0–15.0)
O2 Saturation: 44 %
Potassium: 3.6 mmol/L (ref 3.5–5.1)
Sodium: 148 mmol/L — ABNORMAL HIGH (ref 135–145)
TCO2: 25 mmol/L (ref 22–32)
pCO2, Ven: 49.8 mmHg (ref 44–60)
pH, Ven: 7.289 (ref 7.25–7.43)
pO2, Ven: 28 mmHg — CL (ref 32–45)

## 2022-09-28 SURGERY — IVC FILTER INSERTION
Anesthesia: LOCAL

## 2022-09-28 MED ORDER — SODIUM CHLORIDE 0.9 % IV SOLN: INTRAVENOUS | Status: DC

## 2022-09-28 MED ORDER — MIDAZOLAM HCL 2 MG/2ML IJ SOLN
INTRAMUSCULAR | Status: AC
  Filled 2022-09-28: qty 2

## 2022-09-28 MED ORDER — LIDOCAINE HCL (PF) 1 % IJ SOLN
INTRAMUSCULAR | Status: DC | PRN
  Administered 2022-09-28: 5 mL via SUBCUTANEOUS

## 2022-09-28 MED ORDER — IODIXANOL 320 MG/ML IV SOLN
INTRAVENOUS | Status: DC | PRN
  Administered 2022-09-28: 50 mL via INTRAVENOUS

## 2022-09-28 MED ORDER — FENTANYL CITRATE (PF) 100 MCG/2ML IJ SOLN
INTRAMUSCULAR | Status: AC
  Filled 2022-09-28: qty 2

## 2022-09-28 MED ORDER — FENTANYL CITRATE (PF) 100 MCG/2ML IJ SOLN
INTRAMUSCULAR | Status: DC | PRN
  Administered 2022-09-28: 50 ug via INTRAVENOUS

## 2022-09-28 MED ORDER — MIDAZOLAM HCL 2 MG/2ML IJ SOLN
INTRAMUSCULAR | Status: DC | PRN
  Administered 2022-09-28: 1 mg via INTRAVENOUS

## 2022-09-28 SURGICAL SUPPLY — 11 items
BAG SNAP BAND KOVER 36X36 (MISCELLANEOUS) IMPLANT
COVER DOME SNAP 22 D (MISCELLANEOUS) IMPLANT
FILTER VC CELECT-FEMORAL (Filter) IMPLANT
KIT MICROPUNCTURE NIT STIFF (SHEATH) IMPLANT
KIT PV (KITS) ×2 IMPLANT
PROTECTION STATION PRESSURIZED (MISCELLANEOUS) ×1
SHEATH PROBE COVER 6X72 (BAG) IMPLANT
STATION PROTECTION PRESSURIZED (MISCELLANEOUS) IMPLANT
SYR MEDRAD MARK V 150ML (SYRINGE) IMPLANT
TUBING CONTRAST HIGH PRESS 48 (TUBING) IMPLANT
WIRE BENTSON .035X145CM (WIRE) IMPLANT

## 2022-09-28 NOTE — Interval H&P Note (Signed)
History and Physical Interval Note:  09/28/2022 7:41 AM  Norma Anthony  has presented today for surgery, with the diagnosis of history of pulminary emblize.  The various methods of treatment have been discussed with the patient and family. After consideration of risks, benefits and other options for treatment, the patient has consented to  Procedure(s): IVC FILTER INSERTION (N/A) as a surgical intervention.  The patient's history has been reviewed, patient examined, no change in status, stable for surgery.  I have reviewed the patient's chart and labs.  Questions were answered to the patient's satisfaction.     Servando Snare

## 2022-09-28 NOTE — Op Note (Signed)
    Patient name: Norma Anthony MRN: [id number] DOB: 31-Jul-1956 Sex: female  09/28/2022 Pre-operative Diagnosis: History of DVT in need of total knee arthroplasty Post-operative diagnosis:  Same Surgeon:  Eda Paschal. Donzetta Matters, MD Procedure Performed: 1.  Ultrasound-guided cannulation right common femoral vein 2.  IVC venogram 3.  Placement of Cook Celect IVC filter in infrarenal position 4.  Moderate sedation with fentanyl and Versed for 12 minutes   Indications: 66 year old female with history of DVT currently maintained on Xarelto with plan for total knee arthroplasty.  Plan is for filter placement and will remove after 3 months if patient has recovered well.  Findings: Initial IVC gram demonstrated reflux into what appeared to be a collateral in the left and the filter was placed and somewhat tilted position but the hook was not near the sidewall as the filter does appear midline in the IVC on completion venography which demonstrates normal-appearing IVC less than 20 mm in diameter.   Procedure:  The patient was identified in the holding area and taken to room 8.  The patient was then placed supine on the table and prepped and draped in the usual sterile fashion.  A time out was called.  Ultrasound was used to evaluate the right common femoral vein which was patent and compressible.  The area was anesthetized 1% lidocaine cannulated by functional followed by wire and sheath with direct ultrasound visualization and images saved the permanent record.  Concomitantly we administered fentanyl and Versed for moderate sedation and her vital signs were monitored throughout the case.  We placed a Bentson wire into the IVC followed by the filter introducer sheath and performed IVC venogram and then deployed the filter on the infrarenal position.  Completion venography demonstrated that the filter was tilted but the IVC is also tilted and the hook appears close to the midline for retrieval.  Everything was removed  from the groin pressure held for 6 obtained.  Patient tolerated procedure without any complication.  Contrast: 50 cc     Marigrace Mccole C. Donzetta Matters, MD [facility] Office: [phone] Pager: [phone]

## 2022-10-07 ENCOUNTER — Ambulatory Visit (INDEPENDENT_AMBULATORY_CARE_PROVIDER_SITE_OTHER): Payer: Medicare Other

## 2022-10-07 DIAGNOSIS — G4733 Obstructive sleep apnea (adult) (pediatric): Secondary | ICD-10-CM

## 2022-10-12 ENCOUNTER — Ambulatory Visit: Payer: Self-pay | Admitting: Orthopedic Surgery

## 2022-10-19 ENCOUNTER — Telehealth: Payer: Self-pay | Admitting: Pulmonary Disease

## 2022-10-19 NOTE — Telephone Encounter (Signed)
  Pt calling regarding HST results.     Dr. Halford Chessman please advise, do you have patients sleep study results? Looks like HST was done 10/25

## 2022-10-19 NOTE — Telephone Encounter (Signed)
Taryn/ [facility]CCs can any of you call to reschedule HST?

## 2022-10-19 NOTE — Telephone Encounter (Signed)
The test device only recorded 1.5 hours of data.  [facility] should be contacting her to redo the test.

## 2022-10-23 NOTE — Patient Instructions (Signed)
DUE TO COVID-19 ONLY TWO VISITORS  (aged 65 and older)  ARE ALLOWED TO COME WITH YOU AND STAY IN THE WAITING ROOM ONLY DURING PRE OP AND PROCEDURE.   **NO VISITORS ARE ALLOWED IN THE SHORT STAY AREA OR RECOVERY ROOM!!**  IF YOU WILL BE ADMITTED INTO THE HOSPITAL YOU ARE ALLOWED ONLY FOUR SUPPORT PEOPLE DURING VISITATION HOURS ONLY (7 AM -8PM)   The support person(s) must pass our screening, gel in and out, and wear a mask at all times, including in the patient's room. Patients must also wear a mask when staff or their support person are in the room. Visitors GUEST BADGE MUST BE WORN VISIBLY  One adult visitor may remain with you overnight and MUST be in the room by 8 P.M.     Your procedure is scheduled on: 11/04/22   Report to [facility] Main Entrance    Report to admitting at : 5:15 AM   Call this number if you have problems the morning of surgery [phone]   Do not eat food :After Midnight.   After Midnight you may have the following liquids until: 4:30 AM DAY OF SURGERY  Water Black Coffee (sugar ok, NO MILK/CREAM OR CREAMERS)  Tea (sugar ok, NO MILK/CREAM OR CREAMERS) regular and decaf                             Plain Jell-O (NO RED)                                           Fruit ices (not with fruit pulp, NO RED)                                     Popsicles (NO RED)                                                                  Juice: apple, WHITE grape, WHITE cranberry Sports drinks like Gatorade (NO RED)   The day of surgery:  Drink ONE (1) Pre-Surgery Clear Ensure or G2 at : 4:30 AM the morning of surgery. Drink in one sitting. Do not sip.  This drink was given to you during your hospital  pre-op appointment visit. Nothing else to drink after completing the  Pre-Surgery Clear Ensure or G2.          If you have questions, please contact your surgeon's office.   Oral Hygiene is also important to reduce your risk of infection.                                     Remember - BRUSH YOUR TEETH THE MORNING OF SURGERY WITH YOUR REGULAR TOOTHPASTE   Do NOT smoke after Midnight   Take these medicines the morning of surgery with A SIP OF WATER: gabapentin,Pepcid,pantoprazole.Use inhalers as usual.Alprazolam,meclizine as needed.  You may not have any metal on your body including hair pins, jewelry, and body piercing             Do not wear make-up, lotions, powders, perfumes/cologne, or deodorant  Do not wear nail polish including gel and S&S, artificial/acrylic nails, or any other type of covering on natural nails including finger and toenails. If you have artificial nails, gel coating, etc. that needs to be removed by a nail salon please have this removed prior to surgery or surgery may need to be canceled/ delayed if the surgeon/ anesthesia feels like they are unable to be safely monitored.   Do not shave  48 hours prior to surgery.   Do not bring valuables to the hospital. [facility] IS NOT             RESPONSIBLE   FOR VALUABLES.   Contacts, dentures or bridgework may not be worn into surgery.   Bring small overnight bag day of surgery.   DO NOT BRING YOUR HOME MEDICATIONS TO THE HOSPITAL. PHARMACY WILL DISPENSE MEDICATIONS LISTED ON YOUR MEDICATION LIST TO YOU DURING YOUR ADMISSION IN THE HOSPITAL!    Patients discharged on the day of surgery will not be allowed to drive home.  Someone NEEDS to stay with you for the first 24 hours after anesthesia.   Special Instructions: Bring a copy of your healthcare power of attorney and living will documents         the day of surgery if you haven't scanned them before.              Please read over the following fact sheets you were given: IF YOU HAVE QUESTIONS ABOUT YOUR PRE-OP INSTRUCTIONS PLEASE CALL [phone]    [facility] - Preparing for Surgery Before surgery, you can play an important role.  Because skin is not sterile, your skin needs to be as free of germs  as possible.  You can reduce the number of germs on your skin by washing with CHG (chlorahexidine gluconate) soap before surgery.  CHG is an antiseptic cleaner which kills germs and bonds with the skin to continue killing germs even after washing. Please DO NOT use if you have an allergy to CHG or antibacterial soaps.  If your skin becomes reddened/irritated stop using the CHG and inform your nurse when you arrive at Short Stay. Do not shave (including legs and underarms) for at least 48 hours prior to the first CHG shower.  You may shave your face/neck. Please follow these instructions carefully:  1.  Shower with CHG Soap the night before surgery and the  morning of Surgery.  2.  If you choose to wash your hair, wash your hair first as usual with your  normal  shampoo.  3.  After you shampoo, rinse your hair and body thoroughly to remove the  shampoo.                           4.  Use CHG as you would any other liquid soap.  You can apply chg directly  to the skin and wash                       Gently with a scrungie or clean washcloth.  5.  Apply the CHG Soap to your body ONLY FROM THE NECK DOWN.   Do not use on face/ open  Wound or open sores. Avoid contact with eyes, ears mouth and genitals (private parts).                       Wash face,  Genitals (private parts) with your normal soap.             6.  Wash thoroughly, paying special attention to the area where your surgery  will be performed.  7.  Thoroughly rinse your body with warm water from the neck down.  8.  DO NOT shower/wash with your normal soap after using and rinsing off  the CHG Soap.                9.  Pat yourself dry with a clean towel.            10.  Wear clean pajamas.            11.  Place clean sheets on your bed the night of your first shower and do not  sleep with pets. Day of Surgery : Do not apply any lotions/deodorants the morning of surgery.  Please wear clean clothes to the hospital/surgery  center.  FAILURE TO FOLLOW THESE INSTRUCTIONS MAY RESULT IN THE CANCELLATION OF YOUR SURGERY PATIENT SIGNATURE_________________________________  NURSE SIGNATURE__________________________________  ________________________________________________________________________  Adam Phenix  An incentive spirometer is a tool that can help keep your lungs clear and active. This tool measures how well you are filling your lungs with each breath. Taking long deep breaths may help reverse or decrease the chance of developing breathing (pulmonary) problems (especially infection) following: A long period of time when you are unable to move or be active. BEFORE THE PROCEDURE  If the spirometer includes an indicator to show your best effort, your nurse or respiratory therapist will set it to a desired goal. If possible, sit up straight or lean slightly forward. Try not to slouch. Hold the incentive spirometer in an upright position. INSTRUCTIONS FOR USE  Sit on the edge of your bed if possible, or sit up as far as you can in bed or on a chair. Hold the incentive spirometer in an upright position. Breathe out normally. Place the mouthpiece in your mouth and seal your lips tightly around it. Breathe in slowly and as deeply as possible, raising the piston or the ball toward the top of the column. Hold your breath for 3-5 seconds or for as long as possible. Allow the piston or ball to fall to the bottom of the column. Remove the mouthpiece from your mouth and breathe out normally. Rest for a few seconds and repeat Steps 1 through 7 at least 10 times every 1-2 hours when you are awake. Take your time and take a few normal breaths between deep breaths. The spirometer may include an indicator to show your best effort. Use the indicator as a goal to work toward during each repetition. After each set of 10 deep breaths, practice coughing to be sure your lungs are clear. If you have an incision (the cut made  at the time of surgery), support your incision when coughing by placing a pillow or rolled up towels firmly against it. Once you are able to get out of bed, walk around indoors and cough well. You may stop using the incentive spirometer when instructed by your caregiver.  RISKS AND COMPLICATIONS Take your time so you do not get dizzy or light-headed. If you are in pain, you may need to take or ask  for pain medication before doing incentive spirometry. It is harder to take a deep breath if you are having pain. AFTER USE Rest and breathe slowly and easily. It can be helpful to keep track of a log of your progress. Your caregiver can provide you with a simple table to help with this. If you are using the spirometer at home, follow these instructions: [place] IF:  You are having difficultly using the spirometer. You have trouble using the spirometer as often as instructed. Your pain medication is not giving enough relief while using the spirometer. You develop fever of 100.5 F (38.1 C) or higher. SEEK IMMEDIATE MEDICAL CARE IF:  You cough up bloody sputum that had not been present before. You develop fever of 102 F (38.9 C) or greater. You develop worsening pain at or near the incision site. MAKE SURE YOU:  Understand these instructions. Will watch your condition. Will get help right away if you are not doing well or get worse. Document Released: 04/12/2007 Document Revised: 02/22/2012 Document Reviewed: 06/13/2007 [facility] Patient Information 2014 [place], [place].   ________________________________________________________________________

## 2022-10-26 ENCOUNTER — Encounter (HOSPITAL_COMMUNITY): Payer: Self-pay

## 2022-10-26 ENCOUNTER — Other Ambulatory Visit: Payer: Self-pay

## 2022-10-26 ENCOUNTER — Encounter (HOSPITAL_COMMUNITY)
Admission: RE | Admit: 2022-10-26 | Discharge: 2022-10-26 | Disposition: A | Discharge status: Home / Self Care | Payer: Medicare Other | Source: Ambulatory Visit | Attending: Specialist | Admitting: Specialist

## 2022-10-26 VITALS — BP 157/94 | HR 79 | Temp 98.6°F | Ht 65.0 in | Wt 235.0 lb

## 2022-10-26 DIAGNOSIS — Z86711 Personal history of pulmonary embolism: Secondary | ICD-10-CM | POA: Diagnosis not present

## 2022-10-26 DIAGNOSIS — Z01812 Encounter for preprocedural laboratory examination: Secondary | ICD-10-CM | POA: Insufficient documentation

## 2022-10-26 DIAGNOSIS — Z01818 Encounter for other preprocedural examination: Secondary | ICD-10-CM

## 2022-10-26 HISTORY — DX: Dyspnea, unspecified: R06.00

## 2022-10-26 HISTORY — DX: Anxiety disorder, unspecified: F41.9

## 2022-10-26 HISTORY — DX: Personal history of urinary calculi: Z87.442

## 2022-10-26 HISTORY — DX: Sleep apnea, unspecified: G47.30

## 2022-10-26 HISTORY — DX: Nontoxic multinodular goiter: E04.2

## 2022-10-26 LAB — CBC
HCT: 45.1 % (ref 36.0–46.0)
Hemoglobin: 14.1 g/dL (ref 12.0–15.0)
MCH: 31.3 pg (ref 26.0–34.0)
MCHC: 31.3 g/dL (ref 30.0–36.0)
MCV: 100 fL (ref 80.0–100.0)
Platelets: 170 10*3/uL (ref 150–400)
RBC: 4.51 MIL/uL (ref 3.87–5.11)
RDW: 12.7 % (ref 11.5–15.5)
WBC: 8.3 10*3/uL (ref 4.0–10.5)
nRBC: 0 % (ref 0.0–0.2)

## 2022-10-26 LAB — BASIC METABOLIC PANEL
Anion gap: 13 (ref 5–15)
BUN: 12 mg/dL (ref 8–23)
CO2: 29 mmol/L (ref 22–32)
Calcium: 9.6 mg/dL (ref 8.9–10.3)
Chloride: 105 mmol/L (ref 98–111)
Creatinine, Ser: 0.75 mg/dL (ref 0.44–1.00)
GFR, Estimated: >60 mL/min (ref >60)
Glucose, Bld: 96 mg/dL (ref 70–99)
Potassium: 4.4 mmol/L (ref 3.5–5.1)
Sodium: 147 mmol/L — ABNORMAL HIGH (ref 135–145)

## 2022-10-26 LAB — SURGICAL PCR SCREEN
MRSA, PCR: NEGATIVE
Staphylococcus aureus: NEGATIVE

## 2022-10-26 NOTE — Progress Notes (Signed)
For Short Stay: COVID SWAB appointment date:  Bowel Prep reminder:   For Anesthesia: PCP - [facility] diagnostic Clinic Cardiologist - Dr. Lessie Dings  Chest x-ray - 08/27/22 EKG - 07/05/22 Stress Test -  ECHO -  Cardiac Cath -  Pacemaker/ICD device last checked: Pacemaker orders received: Device Rep notified:  Spinal Cord Stimulator:  Sleep Study - Yes CPAP - NO  Fasting Blood Sugar -  Checks Blood Sugar _____ times a day Date and result of last Hgb A1c-  Last dose of GLP1 agonist-  GLP1 instructions:   Last dose of SGLT-2 inhibitors-  SGLT-2 instructions:   Blood Thinner Instructions: Xarelto and Aspirin: to hold 2 days before: Dr. Shelle Iron Aspirin Instructions: Last Dose:  Activity level: Can go up a flight of stairs and activities of daily living without stopping and without chest pain and/or shortness of breath   Able to exercise without chest pain and/or shortness of breath   Unable to go up a flight of stairs without chest pain and/or shortness of breath     Anesthesia review: hx: PE,CAD,COPD,OSA(NO CPAP)  Patient denies shortness of breath, fever, cough and chest pain at PAT appointment   Patient verbalized understanding of instructions that were given to them at the PAT appointment. Patient was also instructed that they will need to review over the PAT instructions again at home before surgery.

## 2022-10-27 ENCOUNTER — Encounter (HOSPITAL_COMMUNITY): Payer: Self-pay

## 2022-10-27 NOTE — Progress Notes (Signed)
Anesthesia Chart Review:   Case: [id number] Date/Time: 11/04/22 0715   Procedure: TOTAL KNEE ARTHROPLASTY (Left: Knee)   Anesthesia type: Spinal   Pre-op diagnosis: Left knee degenerative joint disease   Location: [facility] ROOM 10 / [facility]   Surgeons: Jene Every, MD       DISCUSSION: Pt is 66 years old with hx CAD, COPD, PE (1996), OSA, multiple DVTs (s/p L IVC insertion 09/28/22), multinodular goiter  Hold xarelto 2 days before surgery. I left voicemail for [facility] in Dr. Ermelinda Das office - need to hold xarelto for 3 days for spinal anesthesia.   VS: BP (!) 157/94   Pulse 79   Temp 37 C (Oral)   Ht 5\' 5"  (1.651 m)   Wt 106.6 kg   SpO2 100%   BMI 39.11 kg/m   PROVIDERS: Primary care at Clinic, [facility] - Endocrinologist is [facility], MD. Last office visit 07/23/22; 1 yr f/u recommended  LABS: Labs reviewed: Acceptable for surgery. (all labs ordered are listed, but only abnormal results are displayed)  Labs Reviewed  BASIC METABOLIC PANEL - Abnormal; Notable for the following components:      Result Value   Sodium 147 (*)    All other components within normal limits  SURGICAL PCR SCREEN  CBC     IMAGES: 1 view CXR 08/27/22:  - Left lower lobe airspace disease concerning for atelectasis versus pneumonia.  08/29/22 thyroid 05/26/22:  1. Heterogeneous multinodular thyroid gland most consistent with multinodular goiter. 2. An approximately 2.1 cm TI-RADS category 4 nodule in the right mid gland meets criteria to consider fine-needle aspiration biopsy if not previously biopsied. 3. A 1.4 cm TI-RADS category 4 nodule in the left mid gland meets criteria for imaging surveillance. Recommend follow-up ultrasound in 1 year.   EKG 07/05/22: Sinus rhythm. Abnormal R-wave progression, early transition. LVH. Anterior Q waves, possibly due to LVH   CV: Echo 02/14/21 (care everywhere):  1. Left ventricle is normal in size and function with EF 60-65%.  Normal LV wall  thickness.  Normal diastolic function.  2.  Right ventricle is normal in size and function.  3.  No valvular abnormalities.  4.  Normal right ventricular systolic pressure.  No evidence of pulmonary hypertension.  5. No pericardial effusion.    Past Medical History:  Diagnosis Date   Anxiety    CAD (coronary artery disease)    COPD (chronic obstructive pulmonary disease) (HCC)    Depression    Dyspnea    Fibromyalgia    GERD (gastroesophageal reflux disease)    Hiatal hernia    History of kidney stones    Migraine headache    Multinodular goiter    Nephrolithiasis    Pulmonary embolism (HCC) 1996   Sleep apnea    Thyroid nodule     Past Surgical History:  Procedure Laterality Date   ABDOMINAL HYSTERECTOMY     BIOPSY THYROID     HERNIA REPAIR     IVC FILTER INSERTION N/A 09/28/2022   Procedure: IVC FILTER INSERTION;  Surgeon: 09/30/2022, MD;  Location: [facility] INVASIVE CV LAB;  Service: Cardiovascular;  Laterality: N/A;   KIDNEY STONE SURGERY     LAPAROSCOPIC NISSEN FUNDOPLICATION     PAROTIDECTOMY     TONSILLECTOMY      MEDICATIONS:  albuterol (VENTOLIN HFA) 108 (90 Base) MCG/ACT inhaler   ALPRAZolam (XANAX) 1 MG tablet   aspirin 81 MG EC tablet   Aspirin-Acetaminophen (GOODYS BACK & BODY PAIN) 500-325  MG PACK   b complex vitamins capsule   benzonatate (TESSALON) 100 MG capsule   Buprenorphine HCl (BELBUCA) 450 MCG FILM   Cholecalciferol (VITAMIN D3) 1.25 MG (50000 UT) CAPS   clindamycin-benzoyl peroxide (BENZACLIN) gel   clotrimazole-betamethasone (LOTRISONE) cream   diphenhydrAMINE (BENADRYL) 25 mg capsule   DULoxetine (CYMBALTA) 60 MG capsule   famotidine (PEPCID) 20 MG tablet   Ferrous Fumarate-Folic Acid 324-1 MG TABS   furosemide (LASIX) 20 MG tablet   gabapentin (NEURONTIN) 300 MG capsule   ibuprofen (ADVIL) 200 MG tablet   Meclizine HCl 25 MG CHEW   melatonin 3 MG TABS tablet   Multiple Vitamins-Minerals (MULTIVITAMIN GUMMIES ADULT PO)    naloxone (NARCAN) nasal spray 4 mg/0.1 mL   pantoprazole (PROTONIX) 40 MG tablet   promethazine (PHENERGAN) 25 MG tablet   salicylic acid-lactic acid 17 % external solution   XARELTO 20 MG TABS tablet   No current facility-administered medications for this encounter.    If no changes, I anticipate pt can proceed with surgery as scheduled.   Rica Mast, PhD, FNP-BC [facility] Short Stay Surgical Center/Anesthesiology Phone: [phone] 10/27/2022 10:47 AM

## 2022-10-27 NOTE — Anesthesia Preprocedure Evaluation (Addendum)
Anesthesia Evaluation  Patient identified by MRN, date of birth, ID band Patient awake    Reviewed: Allergy & Precautions, NPO status , Patient's Chart, lab work & pertinent test results  Airway Mallampati: III  TM Distance: >3 FB Neck ROM: Full    Dental  (+) Edentulous Upper, Edentulous Lower, Dental Advisory Given   Pulmonary shortness of breath, sleep apnea , COPD,  COPD inhaler, former smoker, PE   Pulmonary exam normal breath sounds clear to auscultation       Cardiovascular + CAD and + DVT (s/p IVC 09/2022)  Normal cardiovascular exam Rhythm:Regular Rate:Normal  EKG 07/05/22: Sinus rhythm. Abnormal R-wave progression, early transition. LVH. Anterior Q waves, possibly due to LVH     CV: Echo 02/14/21 (care everywhere):  1. Left ventricle is normal in size and function with EF 60-65%.  Normal LV wall thickness.  Normal diastolic function.  2.  Right ventricle is normal in size and function.  3.  No valvular abnormalities.  4.  Normal right ventricular systolic pressure.  No evidence of pulmonary hypertension.  5. No pericardial effusion.     Neuro/Psych  Headaches PSYCHIATRIC DISORDERS Anxiety Depression       GI/Hepatic Neg liver ROS, hiatal hernia,GERD  Medicated and Poorly Controlled,,  Endo/Other  negative endocrine ROS    Renal/GU negative Renal ROS  negative genitourinary   Musculoskeletal  (+)  Fibromyalgia -, narcotic dependent  Abdominal   Peds  Hematology  (+) Blood dyscrasia (on xarelto, last dose 11/19 at 9pm)   Anesthesia Other Findings   Reproductive/Obstetrics                             Anesthesia Physical Anesthesia Plan  ASA: 3  Anesthesia Plan: General and Regional   Post-op Pain Management: Regional block*, Ketamine IV* and Tylenol PO (pre-op)*   Induction: Intravenous  PONV Risk Score and Plan: 3 and Ondansetron, Dexamethasone and Midazolam  Airway  Management Planned: Oral ETT  Additional Equipment:   Intra-op Plan:   Post-operative Plan: Extubation in OR  Informed Consent: I have reviewed the patients History and Physical, chart, labs and discussed the procedure including the risks, benefits and alternatives for the proposed anesthesia with the patient or authorized representative who has indicated his/her understanding and acceptance.     Dental advisory given  Plan Discussed with: CRNA  Anesthesia Plan Comments: (Plan for GA as patient has not held xarelto for 72 hours. )       Anesthesia Quick Evaluation

## 2022-10-30 ENCOUNTER — Ambulatory Visit: Payer: Self-pay | Admitting: Orthopedic Surgery

## 2022-10-30 NOTE — H&P (Signed)
Norma Anthony is an 66 y.o. female.   Chief Complaint: Left knee pain HPI: Patient is here for her H&P. Patient is scheduled for a left total knee replacement on 11/04/22 by Dr. Shelle Iron at [facility].  She has failed conservative treatment and has ongoing pain interfering with quality of life and activities of daily living. She desires to proceed with surgery.  Dr. Shelle Iron and the patient mutually agreed to proceed with a total knee replacement. Risks and benefits of the procedure were discussed including stiffness, suboptimal range of motion, persistent pain, infection requiring removal of prosthesis and reinsertion, need for prophylactic antibiotics in the future, for example, dental procedures, possible need for manipulation, revision in the future and also anesthetic complications including DVT, PE, etc. We discussed the perioperative course, time in the hospital, postoperative recovery and the need for elevation to control swelling. We also discussed the predicted range of motion and the probability that squatting and kneeling would be unobtainable in the future. In addition, postoperative anticoagulation was discussed. We have obtained preoperative medical clearance as necessary. Provided illustrated handout and discussed it in detail. They will enroll in the total joint replacement educational forum at the hospital.  She has been cleared by PCP and hematology, has had appropriate filter placed. She is still on Xarelto and will be holding that for 2 days preoperatively.  Past Medical History:  Diagnosis Date   Anxiety    CAD (coronary artery disease)    COPD (chronic obstructive pulmonary disease) (HCC)    Depression    Dyspnea    Fibromyalgia    GERD (gastroesophageal reflux disease)    Hiatal hernia    History of kidney stones    Migraine headache    Multinodular goiter    Nephrolithiasis    Pulmonary embolism (HCC) 1996   Sleep apnea    Thyroid nodule     Past Surgical  History:  Procedure Laterality Date   ABDOMINAL HYSTERECTOMY     BIOPSY THYROID     HERNIA REPAIR     IVC FILTER INSERTION N/A 09/28/2022   Procedure: IVC FILTER INSERTION;  Surgeon: Maeola Harman, MD;  Location: [facility] INVASIVE CV LAB;  Service: Cardiovascular;  Laterality: N/A;   KIDNEY STONE SURGERY     LAPAROSCOPIC NISSEN FUNDOPLICATION     PAROTIDECTOMY     TONSILLECTOMY      Family History  Problem Relation Age of Onset   Hypertension Mother    Social History:  reports that she quit smoking about 20 months ago. Her smoking use included cigarettes. She has a 5.00 pack-year smoking history. She has never used smokeless tobacco. She reports that she does not currently use alcohol. She reports that she does not currently use drugs.  Allergies:  Allergies  Allergen Reactions   Lyrica [Pregabalin] Other (See Comments)     Paralysis present (finding)  not tolerated Pt stated she could not move her body but she could think      Compazine [Prochlorperazine] Anxiety and Other (See Comments)    Other reaction(s): anxiety    Droperidol Anxiety    Other reaction(s): anxiety     Ergotamine-Caffeine Anxiety    Other reaction(s): Increased systolic arterial pressure (finding)    Current medications: albuterol sulfate HFA 90 mcg/actuation aerosol inhaler ALPRAZolam 1 mg tablet aspirin 81 mg tablet,delayed release benzonatate 100 mg capsule cephALEXin 500 mg capsule cholecalciferol (vitamin D3) 1,250 mcg (50,000 unit) capsule clindamycin 1 %-benzoyl peroxide 5 % topical gel clotrimazole-betamethasone 1 %-0.05 %  topical cream diphenhydrAMINE 25 mg capsule DULoxetine 60 mg capsule,delayed release famotidine 20 mg tablet fluconazole 150 mg tablet gabapentin 300 mg capsule Hematinic/Folic Acid 324 mg (106 mg iron)-1 mg tablet meclizine 25 mg chewable tablet pantoprazole 40 mg tablet,delayed release predniSONE 50 mg tablet promethazine 25 mg tablet Xarelto 20 mg  tablet  Review of Systems  Constitutional: Negative.   HENT: Negative.    Eyes: Negative.   Respiratory: Negative.    Cardiovascular: Negative.   Gastrointestinal: Negative.   Endocrine: Negative.   Genitourinary: Negative.   Musculoskeletal:  Positive for arthralgias, gait problem, joint swelling and myalgias.  Hematological: Negative.     There were no vitals taken for this visit. Physical Exam Constitutional:      Appearance: Normal appearance.  HENT:     Head: Normocephalic and atraumatic.     Right Ear: External ear normal.     Left Ear: External ear normal.     Nose: Nose normal.     Mouth/Throat:     Pharynx: Oropharynx is clear.  Eyes:     Conjunctiva/sclera: Conjunctivae normal.  Cardiovascular:     Rate and Rhythm: Normal rate and regular rhythm.     Pulses: Normal pulses.     Heart sounds: Normal heart sounds.  Pulmonary:     Effort: Pulmonary effort is normal.     Breath sounds: Normal breath sounds.  Abdominal:     General: Bowel sounds are normal.  Musculoskeletal:     Cervical back: Normal range of motion.     Comments: Left knee valgus deformity tender lateral joint line patellofemoral pain compression she also has tenderness distally along the lateral gastrocsoleus complex. Compartments are soft. No significant swelling. She is neurovascularly intact.  Skin:    General: Skin is warm and dry.  Neurological:     Mental Status: She is alert.     Standing knee x-rays reviewed with end-stage lateral joint space narrowing left knee with a valgus deformity.  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 Shelle Iron on 11-22. We again discussed the procedure itself as well as risks, complications and alternatives, including but not limited to DVT, PE, infx, bleeding, failure of  procedure, need for secondary procedure including manipulation, nerve injury, ongoing pain/symptoms, anesthesia risk, even stroke or death. Also discussed typical post-op protocols, activity restrictions, need for PT, flexion/extension exercises, time out of work. Discussed need for DVT ppx post-op per protocol. Discussed dental ppx and infx prevention. Also discussed limitations post-operatively such as kneeling and squatting. All questions were answered. Patient desires to proceed with surgery as scheduled.  Will hold supplements, ASA and Xarelto accordingly. Will remain NPO after midnight the night before surgery. Will present to [facility] for pre-op testing. Anticipate hospital stay to include at least 2 midnights given medical history and to ensure proper pain control. Plan resume Xarelto for DVT ppx post-op. Plan oxycodone, Robaxin, Colace, Miralax. Plan home with HHPT post-op with family members at home for assistance. Will follow up 10-14 days post-op for suture removal and xrays.   Plan Left total knee replacement  Dorothy Spark, PA-C for Dr Shelle Iron 10/30/2022, 11:55 AM

## 2022-10-30 NOTE — H&P (View-Only) (Signed)
Norma Anthony is an 66 y.o. female.   Chief Complaint: Left knee pain HPI: Patient is here for her H&P. Patient is scheduled for a left total knee replacement on 11/04/22 by Dr. Shelle Iron at [facility].  She has failed conservative treatment and has ongoing pain interfering with quality of life and activities of daily living. She desires to proceed with surgery.  Dr. Shelle Iron and the patient mutually agreed to proceed with a total knee replacement. Risks and benefits of the procedure were discussed including stiffness, suboptimal range of motion, persistent pain, infection requiring removal of prosthesis and reinsertion, need for prophylactic antibiotics in the future, for example, dental procedures, possible need for manipulation, revision in the future and also anesthetic complications including DVT, PE, etc. We discussed the perioperative course, time in the hospital, postoperative recovery and the need for elevation to control swelling. We also discussed the predicted range of motion and the probability that squatting and kneeling would be unobtainable in the future. In addition, postoperative anticoagulation was discussed. We have obtained preoperative medical clearance as necessary. Provided illustrated handout and discussed it in detail. They will enroll in the total joint replacement educational forum at the hospital.  She has been cleared by PCP and hematology, has had appropriate filter placed. She is still on Xarelto and will be holding that for 2 days preoperatively.  Past Medical History:  Diagnosis Date   Anxiety    CAD (coronary artery disease)    COPD (chronic obstructive pulmonary disease) (HCC)    Depression    Dyspnea    Fibromyalgia    GERD (gastroesophageal reflux disease)    Hiatal hernia    History of kidney stones    Migraine headache    Multinodular goiter    Nephrolithiasis    Pulmonary embolism (HCC) 1996   Sleep apnea    Thyroid nodule     Past Surgical  History:  Procedure Laterality Date   ABDOMINAL HYSTERECTOMY     BIOPSY THYROID     HERNIA REPAIR     IVC FILTER INSERTION N/A 09/28/2022   Procedure: IVC FILTER INSERTION;  Surgeon: Maeola Harman, MD;  Location: [facility] INVASIVE CV LAB;  Service: Cardiovascular;  Laterality: N/A;   KIDNEY STONE SURGERY     LAPAROSCOPIC NISSEN FUNDOPLICATION     PAROTIDECTOMY     TONSILLECTOMY      Family History  Problem Relation Age of Onset   Hypertension Mother    Social History:  reports that she quit smoking about 20 months ago. Her smoking use included cigarettes. She has a 5.00 pack-year smoking history. She has never used smokeless tobacco. She reports that she does not currently use alcohol. She reports that she does not currently use drugs.  Allergies:  Allergies  Allergen Reactions   Lyrica [Pregabalin] Other (See Comments)     Paralysis present (finding)  not tolerated Pt stated she could not move her body but she could think      Compazine [Prochlorperazine] Anxiety and Other (See Comments)    Other reaction(s): anxiety    Droperidol Anxiety    Other reaction(s): anxiety     Ergotamine-Caffeine Anxiety    Other reaction(s): Increased systolic arterial pressure (finding)    Current medications: albuterol sulfate HFA 90 mcg/actuation aerosol inhaler ALPRAZolam 1 mg tablet aspirin 81 mg tablet,delayed release benzonatate 100 mg capsule cephALEXin 500 mg capsule cholecalciferol (vitamin D3) 1,250 mcg (50,000 unit) capsule clindamycin 1 %-benzoyl peroxide 5 % topical gel clotrimazole-betamethasone 1 %-0.05 %  topical cream diphenhydrAMINE 25 mg capsule DULoxetine 60 mg capsule,delayed release famotidine 20 mg tablet fluconazole 150 mg tablet gabapentin 300 mg capsule Hematinic/Folic Acid 324 mg (106 mg iron)-1 mg tablet meclizine 25 mg chewable tablet pantoprazole 40 mg tablet,delayed release predniSONE 50 mg tablet promethazine 25 mg tablet Xarelto 20 mg  tablet  Review of Systems  Constitutional: Negative.   HENT: Negative.    Eyes: Negative.   Respiratory: Negative.    Cardiovascular: Negative.   Gastrointestinal: Negative.   Endocrine: Negative.   Genitourinary: Negative.   Musculoskeletal:  Positive for arthralgias, gait problem, joint swelling and myalgias.  Hematological: Negative.     There were no vitals taken for this visit. Physical Exam Constitutional:      Appearance: Normal appearance.  HENT:     Head: Normocephalic and atraumatic.     Right Ear: External ear normal.     Left Ear: External ear normal.     Nose: Nose normal.     Mouth/Throat:     Pharynx: Oropharynx is clear.  Eyes:     Conjunctiva/sclera: Conjunctivae normal.  Cardiovascular:     Rate and Rhythm: Normal rate and regular rhythm.     Pulses: Normal pulses.     Heart sounds: Normal heart sounds.  Pulmonary:     Effort: Pulmonary effort is normal.     Breath sounds: Normal breath sounds.  Abdominal:     General: Bowel sounds are normal.  Musculoskeletal:     Cervical back: Normal range of motion.     Comments: Left knee valgus deformity tender lateral joint line patellofemoral pain compression she also has tenderness distally along the lateral gastrocsoleus complex. Compartments are soft. No significant swelling. She is neurovascularly intact.  Skin:    General: Skin is warm and dry.  Neurological:     Mental Status: She is alert.     Standing knee x-rays reviewed with end-stage lateral joint space narrowing left knee with a valgus deformity.  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 Shelle Iron on 11-22. We again discussed the procedure itself as well as risks, complications and alternatives, including but not limited to DVT, PE, infx, bleeding, failure of  procedure, need for secondary procedure including manipulation, nerve injury, ongoing pain/symptoms, anesthesia risk, even stroke or death. Also discussed typical post-op protocols, activity restrictions, need for PT, flexion/extension exercises, time out of work. Discussed need for DVT ppx post-op per protocol. Discussed dental ppx and infx prevention. Also discussed limitations post-operatively such as kneeling and squatting. All questions were answered. Patient desires to proceed with surgery as scheduled.  Will hold supplements, ASA and Xarelto accordingly. Will remain NPO after midnight the night before surgery. Will present to [facility] for pre-op testing. Anticipate hospital stay to include at least 2 midnights given medical history and to ensure proper pain control. Plan resume Xarelto for DVT ppx post-op. Plan oxycodone, Robaxin, Colace, Miralax. Plan home with HHPT post-op with family members at home for assistance. Will follow up 10-14 days post-op for suture removal and xrays.   Plan Left total knee replacement  Dorothy Spark, PA-C for Dr Shelle Iron 10/30/2022, 11:55 AM

## 2022-11-04 ENCOUNTER — Other Ambulatory Visit: Payer: Self-pay

## 2022-11-04 ENCOUNTER — Inpatient Hospital Stay (HOSPITAL_COMMUNITY): Payer: Medicare Other

## 2022-11-04 ENCOUNTER — Encounter (HOSPITAL_COMMUNITY): Payer: Self-pay | Admitting: Specialist

## 2022-11-04 ENCOUNTER — Inpatient Hospital Stay (HOSPITAL_COMMUNITY)
Admission: AD | Admit: 2022-11-04 | Discharge: 2022-11-10 | DRG: 470 | Disposition: A | Discharge status: Home / Self Care | Payer: Medicare Other | Attending: Specialist | Admitting: Specialist

## 2022-11-04 ENCOUNTER — Encounter (HOSPITAL_COMMUNITY)
Admission: AD | Disposition: A | Discharge status: Home / Self Care | Payer: Self-pay | Source: Home / Self Care | Attending: Specialist

## 2022-11-04 ENCOUNTER — Ambulatory Visit (HOSPITAL_COMMUNITY): Payer: Medicare Other | Admitting: Anesthesiology

## 2022-11-04 ENCOUNTER — Ambulatory Visit (HOSPITAL_COMMUNITY): Payer: Medicare Other | Admitting: Emergency Medicine

## 2022-11-04 DIAGNOSIS — G4730 Sleep apnea, unspecified: Secondary | ICD-10-CM | POA: Diagnosis present

## 2022-11-04 DIAGNOSIS — Z8249 Family history of ischemic heart disease and other diseases of the circulatory system: Secondary | ICD-10-CM

## 2022-11-04 DIAGNOSIS — J449 Chronic obstructive pulmonary disease, unspecified: Secondary | ICD-10-CM

## 2022-11-04 DIAGNOSIS — Z7901 Long term (current) use of anticoagulants: Secondary | ICD-10-CM

## 2022-11-04 DIAGNOSIS — Z888 Allergy status to other drugs, medicaments and biological substances status: Secondary | ICD-10-CM

## 2022-11-04 DIAGNOSIS — M1712 Unilateral primary osteoarthritis, left knee: Secondary | ICD-10-CM

## 2022-11-04 DIAGNOSIS — I2510 Atherosclerotic heart disease of native coronary artery without angina pectoris: Secondary | ICD-10-CM | POA: Diagnosis present

## 2022-11-04 DIAGNOSIS — Z79899 Other long term (current) drug therapy: Secondary | ICD-10-CM

## 2022-11-04 DIAGNOSIS — R030 Elevated blood-pressure reading, without diagnosis of hypertension: Secondary | ICD-10-CM | POA: Diagnosis not present

## 2022-11-04 DIAGNOSIS — M797 Fibromyalgia: Secondary | ICD-10-CM | POA: Diagnosis present

## 2022-11-04 DIAGNOSIS — K219 Gastro-esophageal reflux disease without esophagitis: Secondary | ICD-10-CM | POA: Diagnosis present

## 2022-11-04 DIAGNOSIS — Z7982 Long term (current) use of aspirin: Secondary | ICD-10-CM

## 2022-11-04 DIAGNOSIS — Z95828 Presence of other vascular implants and grafts: Secondary | ICD-10-CM

## 2022-11-04 DIAGNOSIS — Z6841 Body Mass Index (BMI) 40.0 and over, adult: Secondary | ICD-10-CM

## 2022-11-04 DIAGNOSIS — Z86711 Personal history of pulmonary embolism: Secondary | ICD-10-CM

## 2022-11-04 DIAGNOSIS — Z87442 Personal history of urinary calculi: Secondary | ICD-10-CM

## 2022-11-04 DIAGNOSIS — Z87891 Personal history of nicotine dependence: Secondary | ICD-10-CM

## 2022-11-04 DIAGNOSIS — R410 Disorientation, unspecified: Secondary | ICD-10-CM | POA: Diagnosis not present

## 2022-11-04 DIAGNOSIS — N390 Urinary tract infection, site not specified: Secondary | ICD-10-CM | POA: Diagnosis not present

## 2022-11-04 DIAGNOSIS — G43909 Migraine, unspecified, not intractable, without status migrainosus: Secondary | ICD-10-CM | POA: Diagnosis not present

## 2022-11-04 DIAGNOSIS — G8929 Other chronic pain: Secondary | ICD-10-CM | POA: Diagnosis present

## 2022-11-04 DIAGNOSIS — F32A Depression, unspecified: Secondary | ICD-10-CM | POA: Diagnosis present

## 2022-11-04 DIAGNOSIS — Z96659 Presence of unspecified artificial knee joint: Secondary | ICD-10-CM

## 2022-11-04 DIAGNOSIS — M21062 Valgus deformity, not elsewhere classified, left knee: Secondary | ICD-10-CM | POA: Diagnosis present

## 2022-11-04 DIAGNOSIS — B952 Enterococcus as the cause of diseases classified elsewhere: Secondary | ICD-10-CM | POA: Diagnosis not present

## 2022-11-04 DIAGNOSIS — F419 Anxiety disorder, unspecified: Secondary | ICD-10-CM | POA: Diagnosis present

## 2022-11-04 HISTORY — PX: TOTAL KNEE ARTHROPLASTY: SHX125

## 2022-11-04 HISTORY — DX: Presence of unspecified artificial knee joint: Z96.659

## 2022-11-04 SURGERY — ARTHROPLASTY, KNEE, TOTAL
Anesthesia: Regional | Site: Knee | Laterality: Left

## 2022-11-04 MED ORDER — GABAPENTIN 300 MG PO CAPS
300.0000 mg | ORAL_CAPSULE | Freq: Every day | ORAL | Status: DC
  Administered 2022-11-05 – 2022-11-10 (×6): 300 mg via ORAL
  Filled 2022-11-04 (×6): qty 1

## 2022-11-04 MED ORDER — CLINDAMYCIN PHOS-BENZOYL PEROX 1-5 % EX GEL: 1.0000 | Freq: Every day | CUTANEOUS | Status: DC

## 2022-11-04 MED ORDER — ONDANSETRON HCL 4 MG/2ML IJ SOLN
INTRAMUSCULAR | Status: AC
  Filled 2022-11-04: qty 2

## 2022-11-04 MED ORDER — BUPIVACAINE LIPOSOME 1.3 % IJ SUSP
INTRAMUSCULAR | Status: AC
  Filled 2022-11-04: qty 20

## 2022-11-04 MED ORDER — BENZONATATE 100 MG PO CAPS
100.0000 mg | ORAL_CAPSULE | Freq: Three times a day (TID) | ORAL | Status: DC
  Administered 2022-11-04 – 2022-11-08 (×13): 100 mg via ORAL
  Filled 2022-11-04 (×15): qty 1

## 2022-11-04 MED ORDER — BISACODYL 5 MG PO TBEC: 5.0000 mg | DELAYED_RELEASE_TABLET | Freq: Every day | ORAL | Status: DC | PRN

## 2022-11-04 MED ORDER — ALBUTEROL SULFATE (2.5 MG/3ML) 0.083% IN NEBU: 3.0000 mL | INHALATION_SOLUTION | Freq: Four times a day (QID) | RESPIRATORY_TRACT | Status: DC | PRN

## 2022-11-04 MED ORDER — METOCLOPRAMIDE HCL 5 MG PO TABS: 5.0000 mg | ORAL_TABLET | Freq: Three times a day (TID) | ORAL | Status: DC | PRN

## 2022-11-04 MED ORDER — FAMOTIDINE 20 MG PO TABS
20.0000 mg | ORAL_TABLET | Freq: Every day | ORAL | Status: DC
  Administered 2022-11-05 – 2022-11-10 (×6): 20 mg via ORAL
  Filled 2022-11-04 (×7): qty 1

## 2022-11-04 MED ORDER — FENTANYL CITRATE (PF) 250 MCG/5ML IJ SOLN
INTRAMUSCULAR | Status: AC
  Filled 2022-11-04: qty 5

## 2022-11-04 MED ORDER — ACETAMINOPHEN 325 MG PO TABS: 325.0000 mg | ORAL_TABLET | Freq: Four times a day (QID) | ORAL | Status: DC | PRN

## 2022-11-04 MED ORDER — POLYETHYLENE GLYCOL 3350 17 G PO PACK: 17.0000 g | PACK | Freq: Every day | ORAL | 0 refills | Status: DC

## 2022-11-04 MED ORDER — DIPHENHYDRAMINE HCL 12.5 MG/5ML PO ELIX: 12.5000 mg | ORAL_SOLUTION | ORAL | Status: DC | PRN

## 2022-11-04 MED ORDER — ONDANSETRON HCL 4 MG PO TABS: 4.0000 mg | ORAL_TABLET | Freq: Four times a day (QID) | ORAL | Status: DC | PRN

## 2022-11-04 MED ORDER — CHLORHEXIDINE GLUCONATE 0.12 % MT SOLN
15.0000 mL | Freq: Once | OROMUCOSAL | Status: AC
  Administered 2022-11-04: 15 mL via OROMUCOSAL

## 2022-11-04 MED ORDER — FENTANYL CITRATE (PF) 100 MCG/2ML IJ SOLN
INTRAMUSCULAR | Status: AC
  Filled 2022-11-04: qty 2

## 2022-11-04 MED ORDER — SODIUM CHLORIDE 0.9 % IR SOLN
Status: DC | PRN
  Administered 2022-11-04: 3000 mL
  Administered 2022-11-04: 1000 mL

## 2022-11-04 MED ORDER — HYDROMORPHONE HCL 1 MG/ML IJ SOLN
0.5000 mg | INTRAMUSCULAR | Status: DC | PRN
  Administered 2022-11-04 – 2022-11-05 (×2): 1 mg via INTRAVENOUS
  Administered 2022-11-05: 0.5 mg via INTRAVENOUS
  Filled 2022-11-04 (×3): qty 1

## 2022-11-04 MED ORDER — ACETAMINOPHEN 500 MG PO TABS: 1000.0000 mg | ORAL_TABLET | Freq: Once | ORAL | Status: AC

## 2022-11-04 MED ORDER — FENTANYL CITRATE (PF) 250 MCG/5ML IJ SOLN
INTRAMUSCULAR | Status: DC | PRN
  Administered 2022-11-04 (×5): 50 ug via INTRAVENOUS
  Administered 2022-11-04: 100 ug via INTRAVENOUS
  Administered 2022-11-04: 50 ug via INTRAVENOUS

## 2022-11-04 MED ORDER — PROPOFOL 10 MG/ML IV BOLUS
INTRAVENOUS | Status: DC | PRN
  Administered 2022-11-04: 100 mg via INTRAVENOUS

## 2022-11-04 MED ORDER — DOCUSATE SODIUM 100 MG PO CAPS: 100.0000 mg | ORAL_CAPSULE | Freq: Two times a day (BID) | ORAL | 1 refills | Status: DC | PRN

## 2022-11-04 MED ORDER — RIVAROXABAN 10 MG PO TABS
10.0000 mg | ORAL_TABLET | Freq: Every day | ORAL | Status: DC
  Administered 2022-11-05 – 2022-11-09 (×5): 10 mg via ORAL
  Filled 2022-11-04 (×5): qty 1

## 2022-11-04 MED ORDER — METHOCARBAMOL 500 MG IVPB - SIMPLE MED
500.0000 mg | Freq: Four times a day (QID) | INTRAVENOUS | Status: DC | PRN
  Administered 2022-11-04: 500 mg via INTRAVENOUS
  Filled 2022-11-04: qty 55

## 2022-11-04 MED ORDER — ONDANSETRON HCL 4 MG/2ML IJ SOLN
4.0000 mg | Freq: Four times a day (QID) | INTRAMUSCULAR | Status: DC | PRN
  Administered 2022-11-04 – 2022-11-05 (×3): 4 mg via INTRAVENOUS
  Filled 2022-11-04 (×3): qty 2

## 2022-11-04 MED ORDER — LIDOCAINE 2% (20 MG/ML) 5 ML SYRINGE
INTRAMUSCULAR | Status: DC | PRN
  Administered 2022-11-04: 60 mg via INTRAVENOUS

## 2022-11-04 MED ORDER — FAMOTIDINE IN NACL 20-0.9 MG/50ML-% IV SOLN
INTRAVENOUS | Status: AC
  Filled 2022-11-04: qty 50

## 2022-11-04 MED ORDER — ROPIVACAINE HCL 5 MG/ML IJ SOLN
INTRAMUSCULAR | Status: DC | PRN
  Administered 2022-11-04: 20 mL via PERINEURAL

## 2022-11-04 MED ORDER — FERROUS FUMARATE-FOLIC ACID 324-1 MG PO TABS: 1.0000 | ORAL_TABLET | Freq: Every day | ORAL | Status: DC

## 2022-11-04 MED ORDER — BUPIVACAINE-EPINEPHRINE 0.5% -1:200000 IJ SOLN
INTRAMUSCULAR | Status: DC | PRN
  Administered 2022-11-04 (×2): 30 mL

## 2022-11-04 MED ORDER — PHENYLEPHRINE HCL (PRESSORS) 10 MG/ML IV SOLN
INTRAVENOUS | Status: AC
  Filled 2022-11-04: qty 1

## 2022-11-04 MED ORDER — HYDROMORPHONE HCL 1 MG/ML IJ SOLN
INTRAMUSCULAR | Status: DC | PRN
  Administered 2022-11-04: .8 mg via INTRAVENOUS
  Administered 2022-11-04: .4 mg via INTRAVENOUS

## 2022-11-04 MED ORDER — DULOXETINE HCL 60 MG PO CPEP
60.0000 mg | ORAL_CAPSULE | Freq: Every day | ORAL | Status: DC
  Administered 2022-11-04 – 2022-11-09 (×6): 60 mg via ORAL
  Filled 2022-11-04 (×6): qty 1

## 2022-11-04 MED ORDER — MAGNESIUM CITRATE PO SOLN: 1.0000 | Freq: Once | ORAL | Status: DC | PRN

## 2022-11-04 MED ORDER — PROPOFOL 10 MG/ML IV BOLUS
INTRAVENOUS | Status: AC
  Filled 2022-11-04: qty 20

## 2022-11-04 MED ORDER — PHENOL 1.4 % MT LIQD: 1.0000 | OROMUCOSAL | Status: DC | PRN

## 2022-11-04 MED ORDER — DOCUSATE SODIUM 100 MG PO CAPS
100.0000 mg | ORAL_CAPSULE | Freq: Two times a day (BID) | ORAL | Status: DC
  Administered 2022-11-04 – 2022-11-10 (×12): 100 mg via ORAL
  Filled 2022-11-04 (×13): qty 1

## 2022-11-04 MED ORDER — GABAPENTIN 300 MG PO CAPS
600.0000 mg | ORAL_CAPSULE | Freq: Every day | ORAL | Status: DC
  Administered 2022-11-04 – 2022-11-09 (×6): 600 mg via ORAL
  Filled 2022-11-04 (×6): qty 2

## 2022-11-04 MED ORDER — DEXAMETHASONE SODIUM PHOSPHATE 10 MG/ML IJ SOLN
INTRAMUSCULAR | Status: DC | PRN
  Administered 2022-11-04: 5 mg

## 2022-11-04 MED ORDER — LIDOCAINE HCL (PF) 2 % IJ SOLN
INTRAMUSCULAR | Status: AC
  Filled 2022-11-04: qty 5

## 2022-11-04 MED ORDER — STERILE WATER FOR IRRIGATION IR SOLN
Status: DC | PRN
  Administered 2022-11-04: 2000 mL

## 2022-11-04 MED ORDER — BUPIVACAINE-EPINEPHRINE (PF) 0.5% -1:200000 IJ SOLN
INTRAMUSCULAR | Status: AC
  Filled 2022-11-04: qty 30

## 2022-11-04 MED ORDER — SODIUM CHLORIDE 0.9% FLUSH
INTRAVENOUS | Status: DC | PRN
  Administered 2022-11-04: 40 mL

## 2022-11-04 MED ORDER — SALICYLIC ACID 17 % EX LIQD: 1.0000 | Freq: Every day | CUTANEOUS | Status: DC

## 2022-11-04 MED ORDER — MECLIZINE HCL 25 MG PO TABS: 25.0000 mg | ORAL_TABLET | Freq: Three times a day (TID) | ORAL | Status: DC | PRN

## 2022-11-04 MED ORDER — KETAMINE HCL 10 MG/ML IJ SOLN
INTRAMUSCULAR | Status: DC | PRN
  Administered 2022-11-04 (×2): 10 mg via INTRAVENOUS
  Administered 2022-11-04: 20 mg via INTRAVENOUS
  Administered 2022-11-04: 10 mg via INTRAVENOUS

## 2022-11-04 MED ORDER — METOCLOPRAMIDE HCL 5 MG/ML IJ SOLN
5.0000 mg | Freq: Three times a day (TID) | INTRAMUSCULAR | Status: DC | PRN
  Administered 2022-11-05: 10 mg via INTRAVENOUS
  Filled 2022-11-04: qty 2

## 2022-11-04 MED ORDER — SODIUM CHLORIDE (PF) 0.9 % IJ SOLN
INTRAMUSCULAR | Status: AC
  Filled 2022-11-04: qty 20

## 2022-11-04 MED ORDER — HYDROMORPHONE HCL 2 MG/ML IJ SOLN
INTRAMUSCULAR | Status: AC
  Filled 2022-11-04: qty 1

## 2022-11-04 MED ORDER — FAMOTIDINE IN NACL 20-0.9 MG/50ML-% IV SOLN
INTRAVENOUS | Status: DC | PRN
  Administered 2022-11-04: 20 mg via INTRAVENOUS

## 2022-11-04 MED ORDER — CEFAZOLIN SODIUM-DEXTROSE 2-4 GM/100ML-% IV SOLN
2.0000 g | INTRAVENOUS | Status: AC
  Administered 2022-11-04: 2 g via INTRAVENOUS
  Filled 2022-11-04: qty 100

## 2022-11-04 MED ORDER — DEXAMETHASONE SODIUM PHOSPHATE 10 MG/ML IJ SOLN
INTRAMUSCULAR | Status: AC
  Filled 2022-11-04: qty 1

## 2022-11-04 MED ORDER — ROCURONIUM BROMIDE 10 MG/ML (PF) SYRINGE
PREFILLED_SYRINGE | INTRAVENOUS | Status: AC
  Filled 2022-11-04: qty 10

## 2022-11-04 MED ORDER — MELATONIN 3 MG PO TABS
3.0000 mg | ORAL_TABLET | Freq: Every day | ORAL | Status: DC
  Administered 2022-11-04 – 2022-11-09 (×5): 3 mg via ORAL
  Filled 2022-11-04 (×6): qty 1

## 2022-11-04 MED ORDER — OXYCODONE HCL 5 MG PO TABS
5.0000 mg | ORAL_TABLET | ORAL | Status: DC | PRN
  Administered 2022-11-04 – 2022-11-06 (×6): 10 mg via ORAL
  Filled 2022-11-04 (×6): qty 2

## 2022-11-04 MED ORDER — ROCURONIUM BROMIDE 10 MG/ML (PF) SYRINGE
PREFILLED_SYRINGE | INTRAVENOUS | Status: DC | PRN
  Administered 2022-11-04: 80 mg via INTRAVENOUS

## 2022-11-04 MED ORDER — HYDROMORPHONE HCL 2 MG PO TABS
2.0000 mg | ORAL_TABLET | ORAL | Status: DC | PRN
  Administered 2022-11-04 – 2022-11-06 (×5): 3 mg via ORAL
  Filled 2022-11-04 (×5): qty 2

## 2022-11-04 MED ORDER — FENTANYL CITRATE PF 50 MCG/ML IJ SOSY: 25.0000 ug | PREFILLED_SYRINGE | INTRAMUSCULAR | Status: DC | PRN

## 2022-11-04 MED ORDER — SUGAMMADEX SODIUM 500 MG/5ML IV SOLN
INTRAVENOUS | Status: DC | PRN
  Administered 2022-11-04: 250 mg via INTRAVENOUS

## 2022-11-04 MED ORDER — PROPOFOL 1000 MG/100ML IV EMUL
INTRAVENOUS | Status: AC
  Filled 2022-11-04: qty 100

## 2022-11-04 MED ORDER — MENTHOL 3 MG MT LOZG: 1.0000 | LOZENGE | OROMUCOSAL | Status: DC | PRN

## 2022-11-04 MED ORDER — ORAL CARE MOUTH RINSE: 15.0000 mL | Freq: Once | OROMUCOSAL | Status: AC

## 2022-11-04 MED ORDER — METHOCARBAMOL 500 MG IVPB - SIMPLE MED
INTRAVENOUS | Status: AC
  Filled 2022-11-04: qty 55

## 2022-11-04 MED ORDER — METHOCARBAMOL 500 MG PO TABS
500.0000 mg | ORAL_TABLET | Freq: Four times a day (QID) | ORAL | Status: DC | PRN
  Administered 2022-11-05 (×3): 500 mg via ORAL
  Filled 2022-11-04 (×3): qty 1

## 2022-11-04 MED ORDER — TRANEXAMIC ACID-NACL 1000-0.7 MG/100ML-% IV SOLN
1000.0000 mg | INTRAVENOUS | Status: AC
  Administered 2022-11-04: 1000 mg via INTRAVENOUS
  Filled 2022-11-04: qty 100

## 2022-11-04 MED ORDER — LACTATED RINGERS IV SOLN: INTRAVENOUS | Status: DC

## 2022-11-04 MED ORDER — KCL IN DEXTROSE-NACL 20-5-0.45 MEQ/L-%-% IV SOLN
INTRAVENOUS | Status: AC
  Filled 2022-11-04 (×2): qty 1000

## 2022-11-04 MED ORDER — ONDANSETRON HCL 4 MG/2ML IJ SOLN
INTRAMUSCULAR | Status: DC | PRN
  Administered 2022-11-04: 4 mg via INTRAVENOUS

## 2022-11-04 MED ORDER — DEXAMETHASONE SODIUM PHOSPHATE 10 MG/ML IJ SOLN
INTRAMUSCULAR | Status: DC | PRN
  Administered 2022-11-04: 10 mg via INTRAVENOUS

## 2022-11-04 MED ORDER — KETAMINE HCL 50 MG/5ML IJ SOSY
PREFILLED_SYRINGE | INTRAMUSCULAR | Status: AC
  Filled 2022-11-04: qty 5

## 2022-11-04 MED ORDER — FUROSEMIDE 20 MG PO TABS
20.0000 mg | ORAL_TABLET | Freq: Every day | ORAL | Status: DC
  Administered 2022-11-04 – 2022-11-09 (×6): 20 mg via ORAL
  Filled 2022-11-04 (×7): qty 1

## 2022-11-04 MED ORDER — GABAPENTIN 300 MG PO CAPS: 300.0000 mg | ORAL_CAPSULE | ORAL | Status: DC

## 2022-11-04 MED ORDER — ACETAMINOPHEN 500 MG PO TABS
1000.0000 mg | ORAL_TABLET | Freq: Four times a day (QID) | ORAL | Status: AC
  Administered 2022-11-04 – 2022-11-05 (×3): 1000 mg via ORAL
  Filled 2022-11-04 (×3): qty 2

## 2022-11-04 MED ORDER — EPHEDRINE SULFATE-NACL 50-0.9 MG/10ML-% IV SOSY
PREFILLED_SYRINGE | INTRAVENOUS | Status: DC | PRN
  Administered 2022-11-04 (×2): 10 mg via INTRAVENOUS

## 2022-11-04 MED ORDER — ALPRAZOLAM 1 MG PO TABS: 1.0000 mg | ORAL_TABLET | Freq: Four times a day (QID) | ORAL | Status: DC | PRN

## 2022-11-04 MED ORDER — NALOXONE HCL 4 MG/0.1ML NA LIQD: 1.0000 | Freq: Once | NASAL | Status: DC | PRN

## 2022-11-04 MED ORDER — MIDAZOLAM HCL 2 MG/2ML IJ SOLN
INTRAMUSCULAR | Status: AC
  Filled 2022-11-04: qty 2

## 2022-11-04 MED ORDER — BUPIVACAINE LIPOSOME 1.3 % IJ SUSP
INTRAMUSCULAR | Status: DC | PRN
  Administered 2022-11-04: 20 mL

## 2022-11-04 MED ORDER — 0.9 % SODIUM CHLORIDE (POUR BTL) OPTIME
TOPICAL | Status: DC | PRN
  Administered 2022-11-04: 1000 mL

## 2022-11-04 MED ORDER — EPINEPHRINE PF 1 MG/ML IJ SOLN
INTRAMUSCULAR | Status: AC
  Filled 2022-11-04: qty 1

## 2022-11-04 MED ORDER — ALPRAZOLAM 0.5 MG PO TABS
1.0000 mg | ORAL_TABLET | Freq: Four times a day (QID) | ORAL | Status: DC | PRN
  Administered 2022-11-04 – 2022-11-10 (×2): 1 mg via ORAL
  Filled 2022-11-04 (×2): qty 2

## 2022-11-04 MED ORDER — SUGAMMADEX SODIUM 500 MG/5ML IV SOLN
INTRAVENOUS | Status: AC
  Filled 2022-11-04: qty 5

## 2022-11-04 MED ORDER — RISAQUAD PO CAPS
1.0000 | ORAL_CAPSULE | Freq: Every day | ORAL | Status: DC
  Administered 2022-11-05 – 2022-11-10 (×6): 1 via ORAL
  Filled 2022-11-04 (×6): qty 1

## 2022-11-04 MED ORDER — PANTOPRAZOLE SODIUM 40 MG PO TBEC
40.0000 mg | DELAYED_RELEASE_TABLET | Freq: Every day | ORAL | Status: DC
  Administered 2022-11-04 – 2022-11-10 (×7): 40 mg via ORAL
  Filled 2022-11-04 (×7): qty 1

## 2022-11-04 MED ORDER — B COMPLEX-C PO TABS
1.0000 | ORAL_TABLET | Freq: Every day | ORAL | Status: DC
  Administered 2022-11-04 – 2022-11-10 (×7): 1 via ORAL
  Filled 2022-11-04 (×7): qty 1

## 2022-11-04 MED ORDER — EPHEDRINE 5 MG/ML INJ
INTRAVENOUS | Status: AC
  Filled 2022-11-04: qty 5

## 2022-11-04 MED ORDER — MIDAZOLAM HCL 2 MG/2ML IJ SOLN
INTRAMUSCULAR | Status: DC | PRN
  Administered 2022-11-04: 2 mg via INTRAVENOUS

## 2022-11-04 MED ORDER — PROMETHAZINE HCL 25 MG PO TABS: 25.0000 mg | ORAL_TABLET | Freq: Four times a day (QID) | ORAL | Status: DC | PRN

## 2022-11-04 MED ORDER — BUPRENORPHINE HCL 450 MCG BU FILM: 450.0000 ug | ORAL_FILM | Freq: Two times a day (BID) | BUCCAL | Status: DC

## 2022-11-04 MED ORDER — ALUM & MAG HYDROXIDE-SIMETH 200-200-20 MG/5ML PO SUSP
30.0000 mL | ORAL | Status: DC | PRN
  Administered 2022-11-07: 30 mL via ORAL
  Filled 2022-11-04: qty 30

## 2022-11-04 MED ORDER — POLYETHYLENE GLYCOL 3350 17 G PO PACK: 17.0000 g | PACK | Freq: Every day | ORAL | Status: DC | PRN

## 2022-11-04 MED ORDER — ACETAMINOPHEN 10 MG/ML IV SOLN
1000.0000 mg | INTRAVENOUS | Status: AC
  Administered 2022-11-04: 1000 mg via INTRAVENOUS
  Filled 2022-11-04: qty 100

## 2022-11-04 MED ORDER — SODIUM CHLORIDE (PF) 0.9 % IJ SOLN
INTRAMUSCULAR | Status: AC
  Filled 2022-11-04: qty 10

## 2022-11-04 SURGICAL SUPPLY — 75 items
AGENT HMST SPONGE THK3/8 (HEMOSTASIS)
ATTUNE PSFEM LTSZ6 NARCEM KNEE (Femur) IMPLANT
ATTUNE PSRP INSR SZ6 5 KNEE (Insert) IMPLANT
BAG COUNTER SPONGE SURGICOUNT (BAG) IMPLANT
BAG DECANTER FOR FLEXI CONT (MISCELLANEOUS) ×2 IMPLANT
BAG SPEC THK2 15X12 ZIP CLS (MISCELLANEOUS)
BAG SPNG CNTER NS LX DISP (BAG)
BAG ZIPLOCK 12X15 (MISCELLANEOUS) IMPLANT
BASE TIBIAL ROT PLAT SZ 5 KNEE (Knees) IMPLANT
BLADE SAW SGTL 11.0X1.19X90.0M (BLADE) ×2 IMPLANT
BLADE SAW SGTL 13.0X1.19X90.0M (BLADE) ×2 IMPLANT
BLADE SURG SZ10 CARB STEEL (BLADE) ×4 IMPLANT
BNDG ELASTIC 4X5.8 VLCR STR LF (GAUZE/BANDAGES/DRESSINGS) ×2 IMPLANT
BNDG ELASTIC 6X5.8 VLCR STR LF (GAUZE/BANDAGES/DRESSINGS) ×2 IMPLANT
BOWL SMART MIX CTS (DISPOSABLE) ×2 IMPLANT
BSPLAT TIB 5 CMNT ROT PLAT STR (Knees) ×1 IMPLANT
CEMENT HV SMART SET (Cement) ×4 IMPLANT
CLSR STERI-STRIP ANTIMIC 1/2X4 (GAUZE/BANDAGES/DRESSINGS) IMPLANT
COVER SURGICAL LIGHT HANDLE (MISCELLANEOUS) ×2 IMPLANT
CUFF TOURN SGL QUICK 34 (TOURNIQUET CUFF) ×1
CUFF TRNQT CYL 34X4.125X (TOURNIQUET CUFF) ×2 IMPLANT
DRAPE EXTREMITY T 121X128X90 (DISPOSABLE) IMPLANT
DRAPE INCISE IOBAN 66X45 STRL (DRAPES) IMPLANT
DRAPE ORTHO SPLIT 77X108 STRL (DRAPES) ×2
DRAPE SHEET LG 3/4 BI-LAMINATE (DRAPES) ×2 IMPLANT
DRAPE SURG ORHT 6 SPLT 77X108 (DRAPES) ×4 IMPLANT
DRAPE U-SHAPE 47X51 STRL (DRAPES) ×2 IMPLANT
DRSG AQUACEL AG ADV 3.5X10 (GAUZE/BANDAGES/DRESSINGS) ×2 IMPLANT
DRSG TEGADERM 4X4.75 (GAUZE/BANDAGES/DRESSINGS) IMPLANT
DURAPREP 26ML APPLICATOR (WOUND CARE) ×2 IMPLANT
ELECT BLADE TIP CTD 4 INCH (ELECTRODE) IMPLANT
ELECT REM PT RETURN 15FT ADLT (MISCELLANEOUS) ×2 IMPLANT
EVACUATOR 1/8 PVC DRAIN (DRAIN) IMPLANT
GAUZE SPONGE 2X2 8PLY STRL LF (GAUZE/BANDAGES/DRESSINGS) IMPLANT
GLOVE BIO SURGEON STRL SZ7 (GLOVE) ×2 IMPLANT
GLOVE BIOGEL PI IND STRL 7.0 (GLOVE) ×2 IMPLANT
GLOVE BIOGEL PI IND STRL 8 (GLOVE) ×2 IMPLANT
GLOVE SURG SS PI 8.0 STRL IVOR (GLOVE) ×2 IMPLANT
GOWN STRL REUS W/ TWL XL LVL3 (GOWN DISPOSABLE) ×4 IMPLANT
GOWN STRL REUS W/TWL XL LVL3 (GOWN DISPOSABLE) ×2
HANDPIECE INTERPULSE COAX TIP (DISPOSABLE) ×1
HEMOSTAT SPONGE AVITENE ULTRA (HEMOSTASIS) IMPLANT
HOLDER FOLEY CATH W/STRAP (MISCELLANEOUS) IMPLANT
IMMOBILIZER KNEE 20 (SOFTGOODS) ×1
IMMOBILIZER KNEE 20 THIGH 36 (SOFTGOODS) ×2 IMPLANT
KIT TURNOVER KIT A (KITS) IMPLANT
MANIFOLD NEPTUNE II (INSTRUMENTS) ×2 IMPLANT
NS IRRIG 1000ML POUR BTL (IV SOLUTION) IMPLANT
PACK TOTAL KNEE CUSTOM (KITS) ×2 IMPLANT
PATELLA MEDIAL ATTUN 35MM KNEE (Knees) IMPLANT
PIN STEINMAN FIXATION KNEE (PIN) IMPLANT
PROTECTOR NERVE ULNAR (MISCELLANEOUS) ×2 IMPLANT
SAW OSC TIP CART 19.5X105X1.3 (SAW) IMPLANT
SEALER BIPOLAR AQUA 6.0 (INSTRUMENTS) IMPLANT
SET HNDPC FAN SPRY TIP SCT (DISPOSABLE) ×2 IMPLANT
SOLUTION PRONTOSAN WOUND 350ML (IRRIGATION / IRRIGATOR) ×2 IMPLANT
SPIKE FLUID TRANSFER (MISCELLANEOUS) ×2 IMPLANT
STAPLER VISISTAT (STAPLE) IMPLANT
STRIP CLOSURE SKIN 1/2X4 (GAUZE/BANDAGES/DRESSINGS) IMPLANT
SUT BONE WAX W31G (SUTURE) ×2 IMPLANT
SUT MNCRL AB 4-0 PS2 18 (SUTURE) IMPLANT
SUT STRATAFIX 0 PDS 27 VIOLET (SUTURE) ×1
SUT VIC AB 1 CT1 27 (SUTURE) ×3
SUT VIC AB 1 CT1 27XBRD ANTBC (SUTURE) ×6 IMPLANT
SUT VIC AB 2-0 CT1 27 (SUTURE) ×3
SUT VIC AB 2-0 CT1 TAPERPNT 27 (SUTURE) ×6 IMPLANT
SUTURE STRATFX 0 PDS 27 VIOLET (SUTURE) ×2 IMPLANT
SYR 3ML LL SCALE MARK (SYRINGE) IMPLANT
TIBIAL BASE ROT PLAT SZ 5 KNEE (Knees) ×1 IMPLANT
TIP HIGH FLOW IRRIGATION COAX (MISCELLANEOUS) IMPLANT
TRAY FOLEY MTR SLVR 16FR STAT (SET/KITS/TRAYS/PACK) ×2 IMPLANT
TRAY FOLEY W/BAG SLVR 14FR LF (SET/KITS/TRAYS/PACK) IMPLANT
WATER STERILE IRR 1000ML POUR (IV SOLUTION) ×2 IMPLANT
WIPE CHG 2% 2PK PREOPERATIVE (MISCELLANEOUS) ×2 IMPLANT
WRAP KNEE MAXI GEL POST OP (GAUZE/BANDAGES/DRESSINGS) ×2 IMPLANT

## 2022-11-04 NOTE — Interval H&P Note (Signed)
History and Physical Interval Note:  11/04/2022 7:25 AM  Norma Anthony  has presented today for surgery, with the diagnosis of Left knee degenerative joint disease.  The various methods of treatment have been discussed with the patient and family. After consideration of risks, benefits and other options for treatment, the patient has consented to  Procedure(s): TOTAL KNEE ARTHROPLASTY (Left) as a surgical intervention.  The patient's history has been reviewed, patient examined, no change in status, stable for surgery.  I have reviewed the patient's chart and labs.  Questions were answered to the patient's satisfaction.     Javier Docker

## 2022-11-04 NOTE — Care Management Obs Status (Signed)
MEDICARE OBSERVATION STATUS NOTIFICATION   Patient Details  Name: Norma Anthony MRN: [id number] Date of Birth: 1956-07-23   Medicare Observation Status Notification Given:  Yes    Georgie Chard, LCSW 11/04/2022, 3:54 PM

## 2022-11-04 NOTE — Anesthesia Procedure Notes (Signed)
Procedure Name: Intubation Date/Time: 11/04/2022 7:39 AM  Performed by: Florene Route, CRNAPre-anesthesia Checklist: Patient identified, Emergency Drugs available, Suction available and Patient being monitored Patient Re-evaluated:Patient Re-evaluated prior to induction Oxygen Delivery Method: Circle system utilized Preoxygenation: Pre-oxygenation with 100% oxygen Induction Type: IV induction Ventilation: Mask ventilation without difficulty and Oral airway inserted - appropriate to patient size Laryngoscope Size: Hyacinth Meeker and 2 Grade View: Grade I Tube type: Oral Tube size: 7.5 mm Number of attempts: 1 Airway Equipment and Method: Stylet Placement Confirmation: ETT inserted through vocal cords under direct vision, positive ETCO2 and breath sounds checked- equal and bilateral Secured at: 22 cm Tube secured with: Tape Dental Injury: Teeth and Oropharynx as per pre-operative assessment

## 2022-11-04 NOTE — Care Management Important Message (Signed)
Important Message  Patient Details  Name: Norma Anthony MRN: [id number] Date of Birth: 10/01/1956   Medicare Important Message Given:  Yes     Georgie Chard, LCSW 11/04/2022, 3:54 PM

## 2022-11-04 NOTE — Anesthesia Postprocedure Evaluation (Signed)
Anesthesia Post Note  Patient: Norma Anthony  Procedure(s) Performed: TOTAL KNEE ARTHROPLASTY (Left: Knee)     Patient location during evaluation: PACU Anesthesia Type: Regional and General Level of consciousness: awake and alert Pain management: pain level controlled Vital Signs Assessment: post-procedure vital signs reviewed and stable Respiratory status: spontaneous breathing, nonlabored ventilation, respiratory function stable and patient connected to nasal cannula oxygen Cardiovascular status: blood pressure returned to baseline and stable Postop Assessment: no apparent nausea or vomiting Anesthetic complications: no  No notable events documented.  Last Vitals:  Vitals:   11/04/22 1230 11/04/22 1244  BP: 133/73 131/76  Pulse: (!) 109 100  Resp: 14 13  Temp:  36.6 C  SpO2: 93% 100%    Last Pain:  Vitals:   11/04/22 1244  TempSrc: Oral  PainSc: 8                  Tifini Reeder L Kitrina Maurin

## 2022-11-04 NOTE — Interval H&P Note (Signed)
History and Physical Interval Note:  11/04/2022 7:26 AM  Norma Anthony  has presented today for surgery, with the diagnosis of Left knee degenerative joint disease.  The various methods of treatment have been discussed with the patient and family. After consideration of risks, benefits and other options for treatment, the patient has consented to  Procedure(s): TOTAL KNEE ARTHROPLASTY (Left) as a surgical intervention.  The patient's history has been reviewed, patient examined, no change in status, stable for surgery.  I have reviewed the patient's chart and labs.  Questions were answered to the patient's satisfaction.     Javier Docker

## 2022-11-04 NOTE — Brief Op Note (Signed)
11/04/2022  7:26 AM  PATIENT:  Norma Anthony  66 y.o. female  PRE-OPERATIVE DIAGNOSIS:  Left knee degenerative joint disease  POST-OPERATIVE DIAGNOSIS:  * No post-op diagnosis entered *  PROCEDURE:  Procedure(s): TOTAL KNEE ARTHROPLASTY (Left)  SURGEON:  Surgeon(s) and Role:    Jene Every, MD - Primary  PHYSICIAN ASSISTANT:   ASSISTANTS: Bissell   ANESTHESIA:   general  EBL:  50   BLOOD ADMINISTERED:none  DRAINS: none   LOCAL MEDICATIONS USED:  MARCAINE     SPECIMEN:  No Specimen  DISPOSITION OF SPECIMEN:  N/A  COUNTS:  YES  TOURNIQUET:  * Missing tourniquet times found for documented tourniquets in log: [id number] *  DICTATION: .Other Dictation: Dictation Number   [id number]  PLAN OF CARE: Admit to inpatient   PATIENT DISPOSITION:  PACU - hemodynamically stable.   Delay start of Pharmacological VTE agent (>24hrs) due to surgical blood loss or risk of bleeding: no

## 2022-11-04 NOTE — Anesthesia Procedure Notes (Signed)
Anesthesia Regional Block: Adductor canal block   Pre-Anesthetic Checklist: , timeout performed,  Correct Patient, Correct Site, Correct Laterality,  Correct Procedure, Correct Position, site marked,  Risks and benefits discussed,  Pre-op evaluation,  At surgeon's request and post-op pain management  Laterality: Left  Prep: Maximum Sterile Barrier Precautions used, chloraprep       Needles:  Injection technique: Single-shot  Needle Type: Echogenic Stimulator Needle     Needle Length: 9cm  Needle Gauge: 21     Additional Needles:   Procedures:,,,, ultrasound used (permanent image in chart),,    Narrative:  Start time: 11/04/2022 7:20 AM End time: 11/04/2022 7:24 AM Injection made incrementally with aspirations every 5 mL. Anesthesiologist: Elmer Picker, MD

## 2022-11-04 NOTE — Evaluation (Signed)
Physical Therapy Evaluation Patient Details Name: Norma Anthony MRN: [id number] DOB: Aug 29, 1956 Today's Date: 11/04/2022  History of Present Illness  66 y.o. female admitted for L TKA 11/04/22. PMH: anxiety, COPD, migraines, sleep apnea, PE, IVC filter 09/28/22.  Clinical Impression  Pt admitted with above diagnosis. Pt ambulated 90' with RW, distance limited by pain and fatigue. SaO2 85% on room air with activity, 91% on 2L O2. Pt reports she uses O2 at home prn. Initiated TKA HEP. Good progress expected.  Pt currently with functional limitations due to the deficits listed below (see PT Problem List). Pt will benefit from skilled PT to increase their independence and safety with mobility to allow discharge to the venue listed below.          Recommendations for follow up therapy are one component of a multi-disciplinary discharge planning process, led by the attending physician.  Recommendations may be updated based on patient status, additional functional criteria and insurance authorization.  Follow Up Recommendations Follow physician's recommendations for discharge plan and follow up therapies      Assistance Recommended at Discharge Intermittent Supervision/Assistance  Patient can return home with the following  A little help with bathing/dressing/bathroom;Assistance with cooking/housework;Assist for transportation;Help with stairs or ramp for entrance    Equipment Recommendations None recommended by PT  Recommendations for Other Services       Functional Status Assessment Patient has had a recent decline in their functional status and demonstrates the ability to make significant improvements in function in a reasonable and predictable amount of time.     Precautions / Restrictions Precautions Precautions: Knee;Fall Precaution Comments: reviewed no pillow under knee Restrictions Weight Bearing Restrictions: No Other Position/Activity Restrictions: WBAT      Mobility  Bed  Mobility Overal bed mobility: Modified Independent             General bed mobility comments: HOB up, used rail    Transfers Overall transfer level: Needs assistance Equipment used: Rolling walker (2 wheels) Transfers: Sit to/from Stand Sit to Stand: Min guard, From elevated surface           General transfer comment: VCs hand placement    Ambulation/Gait Ambulation/Gait assistance: Min guard Gait Distance (Feet): 35 Feet Assistive device: Rolling walker (2 wheels) Gait Pattern/deviations: Step-to pattern, Decreased step length - right, Decreased step length - left Gait velocity: decr     General Gait Details: SaO2 85% on room air immediately after walking, 91 on 2L after 2 minutes seated rest, VCs sequencing, distance limited by pain  Stairs            Wheelchair Mobility    Modified Rankin (Stroke Patients Only)       Balance Overall balance assessment: Modified Independent                                           Pertinent Vitals/Pain Pain Assessment Pain Assessment: 0-10 Pain Score: 7  Pain Location: L knee Pain Descriptors / Indicators: Operative site guarding, Sore Pain Intervention(s): Limited activity within patient's tolerance, Monitored during session, Patient requesting pain meds-RN notified, Premedicated before session, Ice applied    Home Living Family/patient expects to be discharged to:: Private residence Living Arrangements: Spouse/significant other Available Help at Discharge: Family;Available 24 hours/day Type of Home: House Home Access: Stairs to enter Entrance Stairs-Rails: None Entrance Stairs-Number of Steps: 1   Home Layout: One  level Home Equipment: Agricultural consultant (2 wheels);Cane - single point      Prior Function Prior Level of Function : Independent/Modified Independent;History of Falls (last six months)             Mobility Comments: Tourist information centre manager, drives ADLs Comments: Independent      Hand Dominance        Extremity/Trunk Assessment   Upper Extremity Assessment Upper Extremity Assessment: Overall WFL for tasks assessed    Lower Extremity Assessment Lower Extremity Assessment: LLE deficits/detail LLE Deficits / Details: SLR 3/5, knee ext at least 3/5 LLE Sensation: WNL LLE Coordination: WNL    Cervical / Trunk Assessment Cervical / Trunk Assessment: Kyphotic  Communication   Communication: No difficulties  Cognition                                                General Comments      Exercises Total Joint Exercises Ankle Circles/Pumps: AROM, Both, 10 reps, Supine Heel Slides: AAROM, Left, 5 reps, Supine   Assessment/Plan    PT Assessment Patient needs continued PT services  PT Problem List Decreased range of motion;Decreased mobility;Decreased strength;Decreased activity tolerance;Decreased knowledge of use of DME       PT Treatment Interventions Gait training;DME instruction;Therapeutic activities;Therapeutic exercise;Patient/family education;Functional mobility training;Stair training    PT Goals (Current goals can be found in the Care Plan section)  Acute Rehab PT Goals Patient Stated Goal: travel to [facility] PT Goal Formulation: With patient/family Time For Goal Achievement: 11/04/22 Potential to Achieve Goals: Good    Frequency 7X/week     Co-evaluation               AM-PAC PT "6 Clicks" Mobility  Outcome Measure Help needed turning from your back to your side while in a flat bed without using bedrails?: A Little Help needed moving from lying on your back to sitting on the side of a flat bed without using bedrails?: A Little Help needed moving to and from a bed to a chair (including a wheelchair)?: A Little Help needed standing up from a chair using your arms (e.g., wheelchair or bedside chair)?: A Little Help needed to walk in hospital room?: A Little Help needed climbing 3-5 steps with a railing? : A  Lot 6 Click Score: 17    End of Session Equipment Utilized During Treatment: Gait belt;Oxygen Activity Tolerance: Patient limited by pain;Patient limited by fatigue Patient left: in chair;with chair alarm set;with call bell/phone within reach Nurse Communication: Mobility status PT Visit Diagnosis: Difficulty in walking, not elsewhere classified (R26.2);Pain Pain - Right/Left: Left Pain - part of body: Knee    Time: 7096-2836 PT Time Calculation (min) (ACUTE ONLY): 27 min   Charges:   PT Evaluation $PT Eval Moderate Complexity: 1 Mod PT Treatments $Gait Training: 8-22 mins       Ralene Bathe Kistler PT 11/04/2022  Acute Rehabilitation Services  Office [phone]

## 2022-11-04 NOTE — Op Note (Signed)
Norma Anthony, Norma Anthony MEDICAL RECORD NO: [id number] ACCOUNT NO: [id number] DATE OF BIRTH: 1956/03/10 FACILITY: [facility] LOCATION: [facility] PHYSICIAN: Javier Docker, MD  Operative Report   DATE OF PROCEDURE: 11/04/2022  PREOPERATIVE DIAGNOSES:  End-stage osteoarthrosis, left knee valgus deformity.  POSTOPERATIVE DIAGNOSES:  End-stage osteoarthrosis, left knee valgus deformity.  PROCEDURE PERFORMED:  Left total knee arthroplasty utilizing DePuy Attune rotating platform 6 femur, 5 tibia, 5 mm insert, 35 patella.  ANESTHESIA:  General.  ASSISTANT:  Jaclyn L. [place], [place].   HISTORY:  A 66 year old with end-stage osteoarthrosis, left knee valgus deformity, correctable.  Indicated for replacement of the degenerated joint.  Risks and benefits discussed including bleeding, infection, damage to neurovascular structures, no  change in symptoms, worsening symptoms, DVT, PE, anesthetic complications, etc.  DESCRIPTION OF PROCEDURE:  With the patient in supine position, after inducted with adequate general anesthesia, 2 grams Kefzol, left lower extremity was prepped and draped and exsanguinated in usual sterile fashion.  Thigh tourniquet inflated to 225  mmHg.  Midline incision was then made over the knee.  Full thickness flaps developed.  Median parapatellar arthrotomy was performed.  Patella was gently everted.  Fat pad was debrided.  Soft tissues minimally elevated medially.  Knee was gently flexed.   End-stage osteoarthrosis, lateral compartment and patellofemoral joint was noted.  Remnants of the medial and lateral menisci were excised.  ACL was removed.  A Leksell was utilized to fashion a notch just above the femoral notch for entry point for the  femoral drill.  Femur was drilled in line with the femur, irrigated, T-handle placed.  Then an intramedullary guide 6-degree left.  With 9 off the distal femur, this was then pinned.  I performed a distal femoral cut, sized the femur to a 6 off the  anterior  cortex 3 degrees of external rotation.  This was pinned as well and I put a distal femoral cutting block, secured it and I performed the anterior, posterior and chamfer cuts.  Soft tissues protected posteriorly at all time.  No notching of the  femur was occurred.  I then subluxed the tibia.  Low side was laterally.  I took four off the medial side.  External alignment guide parallel to the shaft bisecting the tibiotalar joint.  Three degrees slope.  This was pinned and I performed the tibial  cut.  Soft tissues protected posteriorly at all times.  I used a 6 extension block and then extension and flexion, the gaps were equivalent and stable.  This was then removed.  Remnants of the menisci were removed, cauterized the geniculates.  Flexed the  knee, subluxed the tibia.  This was measured to a 5 just the medial aspect of tibial tubercle.  This was pinned.  I then harvested bone from the central tibia and impacted into the distal femur.  I drilled it centrally and then secured our punch guide.   Turned attention to the femur with a box cut bisecting the femur.  This was pinned.  I performed a box cut.  This was then removed.  I placed a trial femur, it fit flush.  I drilled our lug holes.  Placed a 5 mm insert and reduced the knee and I had  full extension, full flexion, good stability to varus valgus stressing at 0-30 degrees, negative anterior drawer.  I everted the patella.  This was measured to a 21.  I planed it to a 15 utilizing a shim and patellar jig.  I measured it to a  35 size with  a paddle parallel to the joint.  Medializing our peg holes and these were then drilled, I placed a trial patella, reduced it and had excellent patellofemoral tracking.  All trials were removed and checked posteriorly again, popliteus was intact as was  the posterior capsule.  I cauterized the geniculates.  Used pulsatile lavage to clean the bony surfaces.  I then flexed the knee, everted the patella, thoroughly dried all  surfaces.  Mixed cement on back table under vacuum.  I then drilled the  posterolateral corner of the tibia with drill holes to the sclerotic bone.  I then placed cement into the proximal tibia, digitally pressurizing it.  Placed cement on the tibial tray, then impacted it into place in the tibia without difficulty.   Redundant cement removed.  I cemented femoral component and the end of the femur impacted that, reduced it.  Redundant cement removed.  I placed a 5 trial insert, reduced the knee and held in axial load with the knee in extension throughout the curing of  the cement.  I cemented and clamped the patella.  Marcaine with epinephrine was placed in the posterior aspect of the joint as was Prontosan irrigant.  I then used Exparel, injected in the proximal medial aspect of the tibia.  I injected the distal  quadriceps aspirating and then injecting also the medial capsule.  Covered the knee throughout the curing of the cement with an axial load.  At 60 minutes, tourniquet was deflated and there was any mild bleeding was cauterized.  Had good stability, varus  and valgus stressing at 0-30 degrees, negative anterior drawer and excellent patellofemoral tracking.  I therefore removed the trial 5 insert and meticulously removed all redundant cement.  Then, copiously irrigated with pulsatile lavage followed by  Prontosan and I placed a 5 permanent insert, reduced the knee and had excellent patellofemoral tracking and good stability.  Following this, the knee was placed in slight flexion, I reapproximated the patellar arthrotomy with #1 Vicryl in interrupted  figure-of-eight sutures and then with a running Stratafix.  I had excellent patellofemoral tracking following this.  Irrigated the subcutaneous tissue, which was closed with 2-0 and skin with subcuticular Monocryl.  The patient has flexion to gravity at  90 degrees.  Sterile dressing was applied.  Placed in immobilizer, extubated without difficulty and  transported to the recovery room in satisfactory condition.  The patient tolerated the procedure well.    COMPLICATIONS:  No complications.  ASSISTANT:  Andrez Grime, PA was used throughout the case for patient positioning, closure, exposure.  BLOOD LOSS:  50 mL.   CHR D: 11/04/2022 10:21:31 am T: 11/04/2022 11:04:00 am  JOB: [id number]/ [id number]

## 2022-11-04 NOTE — Discharge Instructions (Addendum)
Elevate leg above heart 6x a day for 20minutes each Use knee immobilizer while walking until can SLR x 10 Use knee immobilizer in bed to keep knee in extension Aquacel dressing may remain in place until follow up. May shower with aquacel dressing in place. If the dressing becomes saturated or peels off, you may remove aquacel dressing. Do not remove steri-strips if they are present. Place new dressing with gauze and tape or ACE bandage which should be kept clean and dry and changed daily.   INSTRUCTIONS AFTER JOINT REPLACEMENT   Remove items at home which could result in a fall. This includes throw rugs or furniture in walking pathways ICE to the affected joint every three hours while awake for 30 minutes at a time, for at least the first 3-5 days, and then as needed for pain and swelling.  Continue to use ice for pain and swelling. You may notice swelling that will progress down to the foot and ankle.  This is normal after surgery.  Elevate your leg when you are not up walking on it.   Continue to use the breathing machine you got in the hospital (incentive spirometer) which will help keep your temperature down.  It is common for your temperature to cycle up and down following surgery, especially at night when you are not up moving around and exerting yourself.  The breathing machine keeps your lungs expanded and your temperature down.   DIET:  As you were doing prior to hospitalization, we recommend a well-balanced diet.  DRESSING / WOUND CARE / SHOWERING  Keep the surgical dressing until follow up.  The dressing is water proof, so you can shower without any extra covering.  IF THE DRESSING FALLS OFF or the wound gets wet inside, change the dressing with sterile gauze.  Please use good hand washing techniques before changing the dressing.  Do not use any lotions or creams on the incision until instructed by your surgeon.    ACTIVITY  Increase activity slowly as tolerated, but follow the weight  bearing instructions below.   No driving for 6 weeks or until further direction given by your physician.  You cannot drive while taking narcotics.  No lifting or carrying greater than 10 lbs. until further directed by your surgeon. Avoid periods of inactivity such as sitting longer than an hour when not asleep. This helps prevent blood clots.  You may return to work once you are authorized by your doctor.     WEIGHT BEARING   Weight bearing as tolerated with assist device (walker, cane, etc) as directed, use it as long as suggested by your surgeon or therapist, typically at least 4-6 weeks.   EXERCISES  Results after joint replacement surgery are often greatly improved when you follow the exercise, range of motion and muscle strengthening exercises prescribed by your doctor. Safety measures are also important to protect the joint from further injury. Any time any of these exercises cause you to have increased pain or swelling, decrease what you are doing until you are comfortable again and then slowly increase them. If you have problems or questions, call your caregiver or physical therapist for advice.   Rehabilitation is important following a joint replacement. After just a few days of immobilization, the muscles of the leg can become weakened and shrink (atrophy).  These exercises are designed to build up the tone and strength of the thigh and leg muscles and to improve motion. Often times heat used for twenty to thirty   minutes before working out will loosen up your tissues and help with improving the range of motion but do not use heat for the first two weeks following surgery (sometimes heat can increase post-operative swelling).   These exercises can be done on a training (exercise) mat, on the floor, on a table or on a bed. Use whatever works the best and is most comfortable for you.    Use music or television while you are exercising so that the exercises are a pleasant break in your day.  This will make your life better with the exercises acting as a break in your routine that you can look forward to.   Perform all exercises about fifteen times, three times per day or as directed.  You should exercise both the operative leg and the other leg as well.  Exercises include:   Quad Sets - Tighten up the muscle on the front of the thigh (Quad) and hold for 5-10 seconds.   Straight Leg Raises - With your knee straight (if you were given a brace, keep it on), lift the leg to 60 degrees, hold for 3 seconds, and slowly lower the leg.  Perform this exercise against resistance later as your leg gets stronger.  Leg Slides: Lying on your back, slowly slide your foot toward your buttocks, bending your knee up off the floor (only go as far as is comfortable). Then slowly slide your foot back down until your leg is flat on the floor again.  Angel Wings: Lying on your back spread your legs to the side as far apart as you can without causing discomfort.  Hamstring Strength:  Lying on your back, push your heel against the floor with your leg straight by tightening up the muscles of your buttocks.  Repeat, but this time bend your knee to a comfortable angle, and push your heel against the floor.  You may put a pillow under the heel to make it more comfortable if necessary.   A rehabilitation program following joint replacement surgery can speed recovery and prevent re-injury in the future due to weakened muscles. Contact your doctor or a physical therapist for more information on knee rehabilitation.    CONSTIPATION  Constipation is defined medically as fewer than three stools per week and severe constipation as less than one stool per week.  Even if you have a regular bowel pattern at home, your normal regimen is likely to be disrupted due to multiple reasons following surgery.  Combination of anesthesia, postoperative narcotics, change in appetite and fluid intake all can affect your bowels.   YOU MUST  use at least one of the following options; they are listed in order of increasing strength to get the job done.  They are all available over the counter, and you may need to use some, POSSIBLY even all of these options:    Drink plenty of fluids (prune juice may be helpful) and high fiber foods Colace 100 mg by mouth twice a day  Senokot for constipation as directed and as needed Dulcolax (bisacodyl), take with full glass of water  Miralax (polyethylene glycol) once or twice a day as needed.  If you have tried all these things and are unable to have a bowel movement in the first 3-4 days after surgery call either your surgeon or your primary doctor.    If you experience loose stools or diarrhea, hold the medications until you stool forms back up.  If your symptoms do not get better within   1 week or if they get worse, check with your doctor.  If you experience "the worst abdominal pain ever" or develop nausea or vomiting, please contact the office immediately for further recommendations for treatment.   ITCHING:  If you experience itching with your medications, try taking only a single pain pill, or even half a pain pill at a time.  You can also use Benadryl over the counter for itching or also to help with sleep.   TED HOSE STOCKINGS:  Use stockings on both legs until for at least 2 weeks or as directed by physician office. They may be removed at night for sleeping.  MEDICATIONS:  See your medication summary on the "After Visit Summary" that nursing will review with you.  You may have some home medications which will be placed on hold until you complete the course of blood thinner medication.  It is important for you to complete the blood thinner medication as prescribed.  PRECAUTIONS:  If you experience chest pain or shortness of breath - call 911 immediately for transfer to the hospital emergency department.   If you develop a fever greater that 101 F, purulent drainage from wound, increased  redness or drainage from wound, foul odor from the wound/dressing, or calf pain - CONTACT YOUR SURGEON.                                                   FOLLOW-UP APPOINTMENTS:  If you do not already have a post-op appointment, please call the office for an appointment to be seen by your surgeon.  Guidelines for how soon to be seen are listed in your "After Visit Summary", but are typically between 1-4 weeks after surgery.  OTHER INSTRUCTIONS:   Knee Replacement:  Do not place pillow under knee, focus on keeping the knee straight while resting. CPM instructions: 0-90 degrees, 2 hours in the morning, 2 hours in the afternoon, and 2 hours in the evening. Place foam block, curve side up under heel at all times except when in CPM or when walking.  DO NOT modify, tear, cut, or change the foam block in any way.  POST-OPERATIVE OPIOID TAPER INSTRUCTIONS: It is important to wean off of your opioid medication as soon as possible. If you do not need pain medication after your surgery it is ok to stop day one. Opioids include: Codeine, Hydrocodone(Norco, Vicodin), Oxycodone(Percocet, oxycontin) and hydromorphone amongst others.  Long term and even short term use of opiods can cause: Increased pain response Dependence Constipation Depression Respiratory depression And more.  Withdrawal symptoms can include Flu like symptoms Nausea, vomiting And more Techniques to manage these symptoms Hydrate well Eat regular healthy meals Stay active Use relaxation techniques(deep breathing, meditating, yoga) Do Not substitute Alcohol to help with tapering If you have been on opioids for less than two weeks and do not have pain than it is ok to stop all together.  Plan to wean off of opioids This plan should start within one week post op of your joint replacement. Maintain the same interval or time between taking each dose and first decrease the dose.  Cut the total daily intake of opioids by one tablet each  day Next start to increase the time between doses. The last dose that should be eliminated is the evening dose.   MAKE SURE YOU:  Understand   these instructions.  Get help right away if you are not doing well or get worse.    Thank you for letting us be a part of your medical care team.  It is a privilege we respect greatly.  We [place] these instructions will help you stay on track for a fast and full recovery!      

## 2022-11-04 NOTE — Transfer of Care (Signed)
Immediate Anesthesia Transfer of Care Note  Patient: Norma Anthony  Procedure(s) Performed: TOTAL KNEE ARTHROPLASTY (Left: Knee)  Patient Location: PACU  Anesthesia Type:GA combined with regional for post-op pain  Level of Consciousness: drowsy  Airway & Oxygen Therapy: Patient Spontanous Breathing and Patient connected to face mask oxygen  Post-op Assessment: Report given to RN and Post -op Vital signs reviewed and stable  Post vital signs: Reviewed and stable  Last Vitals:  Vitals Value Taken Time  BP 149/80 11/04/22 1030  Temp    Pulse 104 11/04/22 1032  Resp 13 11/04/22 1032  SpO2 91 % 11/04/22 1032  Vitals shown include unvalidated device data.  Last Pain:  Vitals:   11/04/22 0603  TempSrc:   PainSc: 7          Complications: No notable events documented.

## 2022-11-04 NOTE — Care Management CC44 (Signed)
Condition Code 44 Documentation Completed  Patient Details  Name: Norma Anthony MRN: [id number] Date of Birth: 1956/04/16   Condition Code 44 given:  Yes Patient signature on Condition Code 44 notice:    Documentation of 2 MD's agreement:  Yes Code 44 added to claim:  Yes    Georgie Chard, LCSW 11/04/2022, 3:54 PM

## 2022-11-05 LAB — CBC
HCT: 39.4 % (ref 36.0–46.0)
Hemoglobin: 11.7 g/dL — ABNORMAL LOW (ref 12.0–15.0)
MCH: 31.2 pg (ref 26.0–34.0)
MCHC: 29.7 g/dL — ABNORMAL LOW (ref 30.0–36.0)
MCV: 105.1 fL — ABNORMAL HIGH (ref 80.0–100.0)
Platelets: 173 10*3/uL (ref 150–400)
RBC: 3.75 MIL/uL — ABNORMAL LOW (ref 3.87–5.11)
RDW: 13.2 % (ref 11.5–15.5)
WBC: 15.7 10*3/uL — ABNORMAL HIGH (ref 4.0–10.5)
nRBC: 0 % (ref 0.0–0.2)

## 2022-11-05 LAB — BASIC METABOLIC PANEL
Anion gap: 7 (ref 5–15)
BUN: 12 mg/dL (ref 8–23)
CO2: 29 mmol/L (ref 22–32)
Calcium: 8.6 mg/dL — ABNORMAL LOW (ref 8.9–10.3)
Chloride: 104 mmol/L (ref 98–111)
Creatinine, Ser: 0.73 mg/dL (ref 0.44–1.00)
GFR, Estimated: >60 mL/min (ref >60)
Glucose, Bld: 132 mg/dL — ABNORMAL HIGH (ref 70–99)
Potassium: 5.1 mmol/L (ref 3.5–5.1)
Sodium: 140 mmol/L (ref 135–145)

## 2022-11-05 MED ORDER — FERROUS FUMARATE 324 (106 FE) MG PO TABS
1.0000 | ORAL_TABLET | Freq: Every day | ORAL | Status: DC
  Administered 2022-11-05 – 2022-11-10 (×6): 106 mg via ORAL
  Filled 2022-11-05 (×6): qty 1

## 2022-11-05 MED ORDER — FOLIC ACID 1 MG PO TABS
1.0000 mg | ORAL_TABLET | Freq: Every day | ORAL | Status: DC
  Administered 2022-11-05 – 2022-11-10 (×6): 1 mg via ORAL
  Filled 2022-11-05 (×6): qty 1

## 2022-11-05 NOTE — Plan of Care (Signed)
  Problem: Education: Goal: Knowledge of the prescribed therapeutic regimen will improve Outcome: Progressing   Problem: Activity: Goal: Range of joint motion will improve Outcome: Progressing   Problem: Pain Management: Goal: Pain level will decrease with appropriate interventions Outcome: Progressing   Problem: Safety: Goal: Ability to remain free from injury will improve Outcome: Progressing   

## 2022-11-05 NOTE — Progress Notes (Signed)
Subjective: 1 Day Post-Op Procedure(s) (LRB): TOTAL KNEE ARTHROPLASTY (Left) Patient reports pain as mild.   Patient seen in rounds for Dr. Shelle Iron. Patient is well, and has had no acute complaints or problems. No acute events overnight. Patient ambulated 35 feet with PT.  We will continue therapy today.   Objective: Vital signs in last 24 hours: Temp:  [97.7 F (36.5 C)-98.7 F (37.1 C)] 98.7 F (37.1 C) (11/23 0636) Pulse Rate:  [97-116] 97 (11/23 0636) Resp:  [11-20] 17 (11/23 0636) BP: (123-161)/(73-99) 157/93 (11/23 0636) SpO2:  [89 %-100 %] 100 % (11/23 0636) Weight:  [111.7 kg] 111.7 kg (11/22 1941)  Intake/Output from previous day:  Intake/Output Summary (Last 24 hours) at 11/05/2022 0927 Last data filed at 11/05/2022 0813 Gross per 24 hour  Intake 2601.67 ml  Output 3275 ml  Net -673.33 ml     Intake/Output this shift: Total I/O In: 8.5 [I.V.:8.5] Out: -   Labs: Recent Labs    11/05/22 0348  HGB 11.7*   Recent Labs    11/05/22 0348  WBC 15.7*  RBC 3.75*  HCT 39.4  PLT 173   Recent Labs    11/05/22 0348  NA 140  K 5.1  CL 104  CO2 29  BUN 12  CREATININE 0.73  GLUCOSE 132*  CALCIUM 8.6*   No results for input(s): "LABPT", "INR" in the last 72 hours.  Exam: General - Patient is Alert and Oriented Extremity - Neurologically intact Sensation intact distally Intact pulses distally Dorsiflexion/Plantar flexion intact Dressing - dressing C/D/I Motor Function - intact, moving foot and toes well on exam.   Past Medical History:  Diagnosis Date   Anxiety    CAD (coronary artery disease)    COPD (chronic obstructive pulmonary disease) (HCC)    Depression    Dyspnea    Fibromyalgia    GERD (gastroesophageal reflux disease)    Hiatal hernia    History of kidney stones    Migraine headache    Multinodular goiter    Nephrolithiasis    Pulmonary embolism (HCC) 1996   Sleep apnea    Thyroid nodule     Assessment/Plan: 1 Day Post-Op  Procedure(s) (LRB): TOTAL KNEE ARTHROPLASTY (Left) Principal Problem:   S/P TKR (total knee replacement) using cement Active Problems:   S/P TKR (total knee replacement) not using cement  Estimated body mass index is 40.98 kg/m as calculated from the following:   Height as of this encounter: 5\' 5"  (1.651 m).   Weight as of this encounter: 111.7 kg. Advance diet Up with therapy   Patient's anticipated LOS is less than 2 midnights, meeting these requirements: - Younger than 31 - Lives within 1 hour of care - Has a competent adult at home to recover with post-op recover - NO history of  - Chronic pain requiring opiods  - Diabetes  - Coronary Artery Disease  - Heart failure  - Heart attack  - Stroke  - DVT/VTE  - Cardiac arrhythmia  - Respiratory Failure/COPD  - Renal failure  - Anemia  - Advanced Liver disease     DVT Prophylaxis - Xarelto > discharge back on normal Xarelto 20 mg Weight bearing as tolerated.  Hgb stable at 11.7 this AM.  Plan is to go Home after hospital stay.  Continue with PT today.   Plan for discharge tomorrow following 1-2 sessions of PT as long as they are meeting their goals.   Patient is scheduled for HHPT.  Follow up  in the office in 2 weeks.   Dennie Bible, PA-C Orthopedic Surgery [phone] 11/05/2022, 9:27 AM

## 2022-11-05 NOTE — Progress Notes (Signed)
Physical Therapy Treatment Patient Details Name: Norma Anthony MRN: [id number] DOB: [birth year] Today's Date: 11/05/2022   History of Present Illness 66 y.o. female admitted for L TKA 11/04/22. PMH: anxiety, COPD, migraines, sleep apnea, PE, IVC filter 09/28/22.    PT Comments    Pt ambulated 130' with min A for 2 episodes of unsteadiness. With sit to stand pt had L lateral loss of balance requiring min/mod A to correct. Instructed pt/spouse in stair training. Pt reported nausea at end of session, as well as loss of taste today. RN notified. Pt would benefit from one more night in hospital due to unsteadiness and nausea.     Recommendations for follow up therapy are one component of a multi-disciplinary discharge planning process, led by the attending physician.  Recommendations may be updated based on patient status, additional functional criteria and insurance authorization.  Follow Up Recommendations  Follow physician's recommendations for discharge plan and follow up therapies     Assistance Recommended at Discharge Intermittent Supervision/Assistance  Patient can return home with the following A little help with bathing/dressing/bathroom;Assistance with cooking/housework;Assist for transportation;Help with stairs or ramp for entrance   Equipment Recommendations  None recommended by PT    Recommendations for Other Services       Precautions / Restrictions Precautions Precautions: Knee;Fall Precaution Booklet Issued: Yes (comment) Precaution Comments: reviewed no pillow under knee Restrictions Weight Bearing Restrictions: No Other Position/Activity Restrictions: WBAT     Mobility  Bed Mobility Overal bed mobility: Modified Independent             General bed mobility comments: up in bed    Transfers Overall transfer level: Needs assistance Equipment used: Rolling walker (2 wheels) Transfers: Sit to/from Stand Sit to Stand: From elevated surface, Min assist, Mod  assist           General transfer comment: loss of balance to L upon standing, required min/mod A to regain balance    Ambulation/Gait Ambulation/Gait assistance: Min guard, Min assist Gait Distance (Feet): 130 Feet Assistive device: Rolling walker (2 wheels) Gait Pattern/deviations: Step-to pattern, Decreased step length - right, Decreased step length - left Gait velocity: decr     General Gait Details: min A for balance due to mild unsteadiness   Stairs Stairs: Yes Stairs assistance: Min assist Stair Management: Backwards, With walker Number of Stairs: 1 General stair comments: spouse present and assisted with steadying RW   Wheelchair Mobility    Modified Rankin (Stroke Patients Only)       Balance Overall balance assessment: Modified Independent                                          Cognition Arousal/Alertness: Awake/alert Behavior During Therapy: WFL for tasks assessed/performed Overall Cognitive Status: Within Functional Limits for tasks assessed                                          Exercises Total Joint Exercises Ankle Circles/Pumps: AROM, Both, 10 reps, Supine Quad Sets: AROM, Left, 5 reps, Supine Heel Slides: AAROM, Left, Supine, 10 reps Long Arc Quad: AROM, Left, 10 reps, Seated Knee Flexion: AAROM, Left, 10 reps, Seated Goniometric ROM: 5-60* AAROM L knee    General Comments        Pertinent Vitals/Pain Pain Assessment  Pain Score: 7  Pain Location: L knee Pain Descriptors / Indicators: Operative site guarding, Sore Pain Intervention(s): Limited activity within patient's tolerance, Monitored during session, Premedicated before session, Ice applied    Home Living                          Prior Function            PT Goals (current goals can now be found in the care plan section) Acute Rehab PT Goals Patient Stated Goal: travel to [facility] PT Goal Formulation: With patient/family Time  For Goal Achievement: 11/04/22 Potential to Achieve Goals: Good Progress towards PT goals: Progressing toward goals    Frequency    7X/week      PT Plan Current plan remains appropriate    Co-evaluation              AM-PAC PT "6 Clicks" Mobility   Outcome Measure  Help needed turning from your back to your side while in a flat bed without using bedrails?: A Little Help needed moving from lying on your back to sitting on the side of a flat bed without using bedrails?: A Little Help needed moving to and from a bed to a chair (including a wheelchair)?: A Little Help needed standing up from a chair using your arms (e.g., wheelchair or bedside chair)?: A Little Help needed to walk in hospital room?: None Help needed climbing 3-5 steps with a railing? : A Little 6 Click Score: 19    End of Session Equipment Utilized During Treatment: Gait belt Activity Tolerance: Patient tolerated treatment well Patient left: in chair;with chair alarm set;with call bell/phone within reach;with family/visitor present Nurse Communication: Mobility status PT Visit Diagnosis: Difficulty in walking, not elsewhere classified (R26.2);Pain Pain - Right/Left: Left Pain - part of body: Knee     Time: 8101-7510 PT Time Calculation (min) (ACUTE ONLY): 26 min  Charges:  $Gait Training: 8-22 mins $Therapeutic Exercise: 8-22 mins                     Ralene Bathe Kistler PT 11/05/2022  Acute Rehabilitation Services  Office [phone]

## 2022-11-05 NOTE — Progress Notes (Signed)
Physical Therapy Treatment Patient Details Name: Norma Anthony MRN: [id number] DOB: 1956/06/12 Today's Date: 11/05/2022   History of Present Illness 66 y.o. female admitted for L TKA 11/04/22. PMH: anxiety, COPD, migraines, sleep apnea, PE, IVC filter 09/28/22.    PT Comments    Pt is progressing with mobility, she ambulated 120' with RW, no loss of balance, SaO2 86% on room air while walking. Initiated TKA HEP, handout issued. Will plan to do stair training next session today.    Recommendations for follow up therapy are one component of a multi-disciplinary discharge planning process, led by the attending physician.  Recommendations may be updated based on patient status, additional functional criteria and insurance authorization.  Follow Up Recommendations  Follow physician's recommendations for discharge plan and follow up therapies     Assistance Recommended at Discharge Intermittent Supervision/Assistance  Patient can return home with the following A little help with bathing/dressing/bathroom;Assistance with cooking/housework;Assist for transportation;Help with stairs or ramp for entrance   Equipment Recommendations  None recommended by PT    Recommendations for Other Services       Precautions / Restrictions Precautions Precautions: Knee;Fall Precaution Booklet Issued: Yes (comment) Precaution Comments: reviewed no pillow under knee Restrictions Weight Bearing Restrictions: No Other Position/Activity Restrictions: WBAT     Mobility  Bed Mobility Overal bed mobility: Modified Independent             General bed mobility comments: HOB up, used rail    Transfers Overall transfer level: Needs assistance Equipment used: Rolling walker (2 wheels) Transfers: Sit to/from Stand Sit to Stand: Min guard, From elevated surface           General transfer comment: VCs hand placement    Ambulation/Gait Ambulation/Gait assistance: Min guard Gait Distance (Feet):  120 Feet Assistive device: Rolling walker (2 wheels) Gait Pattern/deviations: Step-to pattern, Decreased step length - right, Decreased step length - left Gait velocity: decr     General Gait Details: SaO2 86% on room air with  walking, 92% on room air after 1 minute seated rest, VCs sequencing   Stairs             Wheelchair Mobility    Modified Rankin (Stroke Patients Only)       Balance Overall balance assessment: Modified Independent                                          Cognition Arousal/Alertness: Awake/alert Behavior During Therapy: WFL for tasks assessed/performed Overall Cognitive Status: Within Functional Limits for tasks assessed                                          Exercises Total Joint Exercises Ankle Circles/Pumps: AROM, Both, 10 reps, Supine Quad Sets: AROM, Left, 5 reps, Supine Heel Slides: AAROM, Left, Supine, 10 reps Long Arc Quad: AROM, Left, 10 reps, Seated Knee Flexion: AAROM, Left, 10 reps, Seated Goniometric ROM: 5-60* AAROM L knee    General Comments        Pertinent Vitals/Pain Pain Assessment Pain Score: 7  Pain Location: L knee Pain Descriptors / Indicators: Operative site guarding, Sore Pain Intervention(s): Limited activity within patient's tolerance, Monitored during session, Premedicated before session, Ice applied    Home Living  Prior Function            PT Goals (current goals can now be found in the care plan section) Acute Rehab PT Goals Patient Stated Goal: travel to [facility] PT Goal Formulation: With patient/family Time For Goal Achievement: 11/04/22 Potential to Achieve Goals: Good Progress towards PT goals: Progressing toward goals    Frequency    7X/week      PT Plan Current plan remains appropriate    Co-evaluation              AM-PAC PT "6 Clicks" Mobility   Outcome Measure  Help needed turning from your back to  your side while in a flat bed without using bedrails?: A Little Help needed moving from lying on your back to sitting on the side of a flat bed without using bedrails?: A Little Help needed moving to and from a bed to a chair (including a wheelchair)?: A Little Help needed standing up from a chair using your arms (e.g., wheelchair or bedside chair)?: A Little Help needed to walk in hospital room?: None Help needed climbing 3-5 steps with a railing? : A Little 6 Click Score: 19    End of Session Equipment Utilized During Treatment: Gait belt Activity Tolerance: Patient tolerated treatment well Patient left: in chair;with chair alarm set;with call bell/phone within reach Nurse Communication: Mobility status PT Visit Diagnosis: Difficulty in walking, not elsewhere classified (R26.2);Pain Pain - Right/Left: Left Pain - part of body: Knee     Time: 3300-7622 PT Time Calculation (min) (ACUTE ONLY): 26 min  Charges:  $Gait Training: 8-22 mins $Therapeutic Exercise: 8-22 mins                     Ralene Bathe Kistler PT 11/05/2022  Acute Rehabilitation Services  Office [phone]

## 2022-11-05 NOTE — Plan of Care (Signed)

## 2022-11-06 LAB — COMPREHENSIVE METABOLIC PANEL
ALT: 28 U/L (ref 0–44)
AST: 20 U/L (ref 15–41)
Albumin: 3.4 g/dL — ABNORMAL LOW (ref 3.5–5.0)
Alkaline Phosphatase: 51 U/L (ref 38–126)
Anion gap: 9 (ref 5–15)
BUN: 12 mg/dL (ref 8–23)
CO2: 29 mmol/L (ref 22–32)
Calcium: 8.8 mg/dL — ABNORMAL LOW (ref 8.9–10.3)
Chloride: 98 mmol/L (ref 98–111)
Creatinine, Ser: 0.76 mg/dL (ref 0.44–1.00)
GFR, Estimated: >60 mL/min (ref >60)
Glucose, Bld: 144 mg/dL — ABNORMAL HIGH (ref 70–99)
Potassium: 4.4 mmol/L (ref 3.5–5.1)
Sodium: 136 mmol/L (ref 135–145)
Total Bilirubin: 0.7 mg/dL (ref 0.3–1.2)
Total Protein: 7.3 g/dL (ref 6.5–8.1)

## 2022-11-06 LAB — CBC
HCT: 34.6 % — ABNORMAL LOW (ref 36.0–46.0)
Hemoglobin: 11.1 g/dL — ABNORMAL LOW (ref 12.0–15.0)
MCH: 32.2 pg (ref 26.0–34.0)
MCHC: 32.1 g/dL (ref 30.0–36.0)
MCV: 100.3 fL — ABNORMAL HIGH (ref 80.0–100.0)
Platelets: 149 10*3/uL — ABNORMAL LOW (ref 150–400)
RBC: 3.45 MIL/uL — ABNORMAL LOW (ref 3.87–5.11)
RDW: 13.3 % (ref 11.5–15.5)
WBC: 13.3 10*3/uL — ABNORMAL HIGH (ref 4.0–10.5)
nRBC: 0 % (ref 0.0–0.2)

## 2022-11-06 NOTE — Progress Notes (Signed)
Physical Therapy Treatment Patient Details Name: Norma Anthony MRN: [id number] DOB: 1956-01-20 Today's Date: 11/06/2022   History of Present Illness 66 y.o. female admitted for L TKA 11/04/22. PMH: anxiety, COPD, migraines, sleep apnea, PE, IVC filter 09/28/22.    PT Comments    Pt up in recliner at start of treatment. She remains lethargic, slow to respond to questions and commands, significant change from 11/22 and 11/23 when she was very interactive and engaged in conversation. With sit to stand pt became dizzy and could not tolerate standing. BP seated in recliner 130/75, HR 117, SpO2 94% on room air. Pt performed L TKA exercises with assistance. RN notified of above.     Recommendations for follow up therapy are one component of a multi-disciplinary discharge planning process, led by the attending physician.  Recommendations may be updated based on patient status, additional functional criteria and insurance authorization.  Follow Up Recommendations  Follow physician's recommendations for discharge plan and follow up therapies     Assistance Recommended at Discharge Intermittent Supervision/Assistance  Patient can return home with the following A little help with bathing/dressing/bathroom;Assistance with cooking/housework;Assist for transportation;Help with stairs or ramp for entrance;A little help with walking and/or transfers   Equipment Recommendations  None recommended by PT    Recommendations for Other Services       Precautions / Restrictions Precautions Precautions: Knee;Fall Precaution Booklet Issued: Yes (comment) Precaution Comments: reviewed no pillow under knee Restrictions Weight Bearing Restrictions: No Other Position/Activity Restrictions: WBAT     Mobility  Bed Mobility Overal bed mobility: Modified Independent             General bed mobility comments: up in recliner    Transfers Overall transfer level: Needs assistance Equipment used: Rolling  walker (2 wheels) Transfers: Sit to/from Stand Sit to Stand: Min assist           General transfer comment: VCs hand placement, min A to power up from recliner, pt reported dizziness in standing and sat right back down, unable to tolerate standing long enough to obtain BP. In recliner BP was 130/75, HR 117, SpO2 94% on room air, RN notified of pt's lethargy and dizziness in standing.    Ambulation/Gait Ambulation/Gait assistance: Min guard Gait Distance (Feet): 12 Feet Assistive device: Rolling walker (2 wheels) Gait Pattern/deviations: Step-to pattern, Decreased step length - right, Decreased step length - left Gait velocity: decr     General Gait Details: deferred 2* dizziness in standing   Stairs             Wheelchair Mobility    Modified Rankin (Stroke Patients Only)       Balance Overall balance assessment: Needs assistance   Sitting balance-Leahy Scale: Good     Standing balance support: Bilateral upper extremity supported, During functional activity, Reliant on assistive device for balance Standing balance-Leahy Scale: Fair                              Cognition Arousal/Alertness: Lethargic, Suspect due to medications Behavior During Therapy: WFL for tasks assessed/performed Overall Cognitive Status: Within Functional Limits for tasks assessed                                 General Comments: slow to respond to questions/commands, much less verbal than last 2 days        Exercises Total Joint Exercises  Ankle Circles/Pumps: AROM, Both, 10 reps, Supine Quad Sets: AROM, Left, 5 reps, Supine Heel Slides: AAROM, Left, Supine, 10 reps Straight Leg Raises: AAROM, Left, 10 reps, Supine Long Arc Quad: AROM, Left, 10 reps, Seated Knee Flexion: AAROM, Left, 10 reps, Seated    General Comments        Pertinent Vitals/Pain Pain Assessment Pain Score: 5  Pain Location: L knee Pain Descriptors / Indicators: Operative site  guarding, Sore Pain Intervention(s): Limited activity within patient's tolerance, Monitored during session, Repositioned    Home Living                          Prior Function            PT Goals (current goals can now be found in the care plan section) Acute Rehab PT Goals Patient Stated Goal: travel to [facility] PT Goal Formulation: With patient/family Time For Goal Achievement: 11/04/22 Potential to Achieve Goals: Good Progress towards PT goals: Not progressing toward goals - comment (dizzy in standing, lethargic)    Frequency    7X/week      PT Plan Current plan remains appropriate    Co-evaluation              AM-PAC PT "6 Clicks" Mobility   Outcome Measure  Help needed turning from your back to your side while in a flat bed without using bedrails?: A Little Help needed moving from lying on your back to sitting on the side of a flat bed without using bedrails?: A Little Help needed moving to and from a bed to a chair (including a wheelchair)?: A Little Help needed standing up from a chair using your arms (e.g., wheelchair or bedside chair)?: A Little Help needed to walk in hospital room?: A Little Help needed climbing 3-5 steps with a railing? : A Lot 6 Click Score: 17    End of Session Equipment Utilized During Treatment: Gait belt Activity Tolerance: Treatment limited secondary to medical complications (Comment) (dizzy in standing) Patient left: in chair;with chair alarm set;with call bell/phone within reach;with family/visitor present Nurse Communication: Mobility status PT Visit Diagnosis: Difficulty in walking, not elsewhere classified (R26.2);Pain Pain - Right/Left: Left Pain - part of body: Knee     Time: 1421-1436 PT Time Calculation (min) (ACUTE ONLY): 15 min  Charges:  $Therapeutic Exercise: 8-22 mins                     Ralene Bathe Kistler PT 11/06/2022  Acute Rehabilitation Services  Office [phone]

## 2022-11-06 NOTE — Progress Notes (Signed)
Patient vital signs generated yellow MEWS during this shift when patient working with physical therapy. Heart rate increased and blood pressure decreased when standing up.   Vital signs rechecked. Low grade fever this evening and patient encouraged to utilize incentive spirometer. Most recent vital signs do not meet sepsis criteria. Zadie Cleverly for Dr. Shelle Iron updated throughout day. Discharge put on hold.  Haydee Salter, RN 11/06/22 5:40 PM

## 2022-11-06 NOTE — Progress Notes (Signed)
Subjective: 2 Days Post-Op Procedure(s) (LRB): TOTAL KNEE ARTHROPLASTY (Left) Patient reports pain as mild.   Seen in rounds for Dr. Shelle Iron Doing well, was able to get up with PT yesterday Denies CP/SOB, N/V, N/T   Objective: Vital signs in last 24 hours: Temp:  [97.8 F (36.6 C)-99.8 F (37.7 C)] 99.8 F (37.7 C) (11/24 0509) Pulse Rate:  [74-104] 74 (11/24 0509) Resp:  [16-20] 20 (11/24 0509) BP: (130-167)/(69-91) 137/69 (11/24 0509) SpO2:  [92 %-94 %] 94 % (11/24 0509)  Intake/Output from previous day: 11/23 0701 - 11/24 0700 In: 1068.5 [P.O.:1060; I.V.:8.5] Out: 1100 [Urine:1100] Intake/Output this shift: No intake/output data recorded.  Recent Labs    11/05/22 0348 11/06/22 0338  HGB 11.7* 11.1*   Recent Labs    11/05/22 0348 11/06/22 0338  WBC 15.7* 13.3*  RBC 3.75* 3.45*  HCT 39.4 34.6*  PLT 173 149*   Recent Labs    11/05/22 0348  NA 140  K 5.1  CL 104  CO2 29  BUN 12  CREATININE 0.73  GLUCOSE 132*  CALCIUM 8.6*   No results for input(s): "LABPT", "INR" in the last 72 hours.  Neurologically intact Neurovascular intact Sensation intact distally Intact pulses distally Dorsiflexion/Plantar flexion intact Incision: dressing C/D/I Compartment soft   Assessment/Plan: 2 Days Post-Op Procedure(s) (LRB): TOTAL KNEE ARTHROPLASTY (Left) Up with therapy if patient does well with PT today okay to be d/c home Already has HHPT scheduled Currently on xeralto for DVT ppx    Ceasar Mons [phone] 11/06/2022, 8:12 AM

## 2022-11-06 NOTE — Progress Notes (Signed)
Physical Therapy Treatment Patient Details Name: Norma Anthony MRN: [id number] DOB: [birth year] Today's Date: 11/06/2022   History of Present Illness 66 y.o. female admitted for L TKA 11/04/22. PMH: anxiety, COPD, migraines, sleep apnea, PE, IVC filter 09/28/22.    PT Comments    Pt was slow to arouse this morning, somewhat lethargic, but able to verbally respond to questions with increased time, much less verbal than in past 2 days. Assisted pt to bathroom, when she attempted to stand from bedside commode she reported feeling lightheaded. Seated BP 84/61, HR 146. RN called to room. Assisted pt to recliner, in fully reclined position BP 107/72, HR 124. She is not ready to DC home at present. Will follow.    Recommendations for follow up therapy are one component of a multi-disciplinary discharge planning process, led by the attending physician.  Recommendations may be updated based on patient status, additional functional criteria and insurance authorization.  Follow Up Recommendations  Follow physician's recommendations for discharge plan and follow up therapies     Assistance Recommended at Discharge Intermittent Supervision/Assistance  Patient can return home with the following A little help with bathing/dressing/bathroom;Assistance with cooking/housework;Assist for transportation;Help with stairs or ramp for entrance;A little help with walking and/or transfers   Equipment Recommendations  None recommended by PT    Recommendations for Other Services       Precautions / Restrictions Precautions Precautions: Knee;Fall Precaution Booklet Issued: Yes (comment) Precaution Comments: reviewed no pillow under knee Restrictions Weight Bearing Restrictions: No Other Position/Activity Restrictions: WBAT     Mobility  Bed Mobility Overal bed mobility: Modified Independent             General bed mobility comments: used rail, HOB up    Transfers Overall transfer level: Needs  assistance Equipment used: Rolling walker (2 wheels) Transfers: Sit to/from Stand Sit to Stand: Mod assist, From elevated surface           General transfer comment: VCs hand placement, mod A to power up from elevated bed and from bedside commode    Ambulation/Gait Ambulation/Gait assistance: Min guard Gait Distance (Feet): 12 Feet Assistive device: Rolling walker (2 wheels) Gait Pattern/deviations: Step-to pattern, Decreased step length - right, Decreased step length - left Gait velocity: decr     General Gait Details: pt ambulated to bathroom, became lightheaded when she attempted to stand up from bedside commode. BP sitting 84/61, HR 146, RN called to room. Assisted pt to recliner and in full reclined position BP 107/72, HR 124.   Stairs             Wheelchair Mobility    Modified Rankin (Stroke Patients Only)       Balance Overall balance assessment: Needs assistance   Sitting balance-Leahy Scale: Good     Standing balance support: Bilateral upper extremity supported, During functional activity, Reliant on assistive device for balance Standing balance-Leahy Scale: Fair                              Cognition Arousal/Alertness: Lethargic, Suspect due to medications Behavior During Therapy: WFL for tasks assessed/performed Overall Cognitive Status: Within Functional Limits for tasks assessed                                 General Comments: slow to arouse this morning, slow to respond, much less verbal than last 2 days  Exercises Total Joint Exercises Long Arc Quad: AROM, Left, 10 reps, Seated    General Comments        Pertinent Vitals/Pain Pain Assessment Pain Score: 7  Pain Location: L knee Pain Descriptors / Indicators: Operative site guarding, Sore Pain Intervention(s): Limited activity within patient's tolerance, Monitored during session, Premedicated before session, Repositioned    Home Living                           Prior Function            PT Goals (current goals can now be found in the care plan section) Acute Rehab PT Goals Patient Stated Goal: travel to [facility] PT Goal Formulation: With patient/family Time For Goal Achievement: 11/04/22 Potential to Achieve Goals: Good Progress towards PT goals: Not progressing toward goals - comment (hypotensive/tachycardic)    Frequency    7X/week      PT Plan Current plan remains appropriate    Co-evaluation              AM-PAC PT "6 Clicks" Mobility   Outcome Measure  Help needed turning from your back to your side while in a flat bed without using bedrails?: A Little Help needed moving from lying on your back to sitting on the side of a flat bed without using bedrails?: A Little Help needed moving to and from a bed to a chair (including a wheelchair)?: A Little Help needed standing up from a chair using your arms (e.g., wheelchair or bedside chair)?: A Little Help needed to walk in hospital room?: A Little Help needed climbing 3-5 steps with a railing? : A Lot 6 Click Score: 17    End of Session Equipment Utilized During Treatment: Gait belt Activity Tolerance: Treatment limited secondary to medical complications (Comment) (hypotensive, tachycardic) Patient left: in chair;with chair alarm set;with call bell/phone within reach;with family/visitor present;with nursing/sitter in room Nurse Communication: Mobility status PT Visit Diagnosis: Difficulty in walking, not elsewhere classified (R26.2);Pain Pain - Right/Left: Left Pain - part of body: Knee     Time: 8338-2505 PT Time Calculation (min) (ACUTE ONLY): 28 min  Charges:  $Gait Training: 8-22 mins $Therapeutic Exercise: 8-22 mins                     Ralene Bathe Kistler PT 11/06/2022  Acute Rehabilitation Services  Office [phone]

## 2022-11-06 NOTE — TOC Transition Note (Signed)
Transition of Care [facility]) - CM/SW Discharge Note   Patient Details  Name: Norma Anthony MRN: [id number] Date of Birth: 1956/02/09  Transition of Care [facility]) CM/SW Contact:  Lennart Pall, LCSW Phone Number: 11/06/2022, 9:38 AM   Clinical Narrative:     Met with pt and confirming she has needed DME at home. HHPT prearranged with [facility].  No further TOC needs.  Final next level of care: [place] Barriers to Discharge: No Barriers Identified   Patient Goals and CMS Choice Patient states their goals for this hospitalization and ongoing recovery are:: return home      Discharge Placement                       Discharge Plan and Services                DME Arranged: N/A DME Agency: NA       HH Arranged: PT [place] Agency: [facility]        Social Determinants of Health (SDOH) Interventions     Readmission Risk Interventions    11/06/2022    9:37 AM  Readmission Risk Prevention Plan  Transportation Screening Complete  PCP or Specialist Appt within 3-5 Days Complete  HRI or [place] Complete  Social Work Consult for [place] Planning/Counseling Complete  Palliative Care Screening Not Applicable  Medication Review [employer] photographer) Complete

## 2022-11-07 LAB — URINALYSIS, ROUTINE W REFLEX MICROSCOPIC
Bilirubin Urine: NEGATIVE
Glucose, UA: NEGATIVE mg/dL
Hgb urine dipstick: NEGATIVE
Ketones, ur: 5 mg/dL — AB
Nitrite: NEGATIVE
Protein, ur: 30 mg/dL — AB
Specific Gravity, Urine: 1.02 (ref 1.005–1.030)
Squamous Epithelial / HPF: >50 — ABNORMAL HIGH (ref 0–5)
Trans Epithel, UA: 1
WBC, UA: >50 WBC/hpf — ABNORMAL HIGH (ref 0–5)
pH: 7 (ref 5.0–8.0)

## 2022-11-07 MED ORDER — SULFAMETHOXAZOLE-TRIMETHOPRIM 800-160 MG PO TABS
1.0000 | ORAL_TABLET | Freq: Two times a day (BID) | ORAL | Status: DC
  Administered 2022-11-07 – 2022-11-10 (×6): 1 via ORAL
  Filled 2022-11-07 (×7): qty 1

## 2022-11-07 NOTE — Progress Notes (Signed)
Subjective: 3 Days Post-Op Procedure(s) (LRB): TOTAL KNEE ARTHROPLASTY (Left) Patient reports pain as mild.   Patient seen in rounds for Dr. Shelle Iron Progressing slowly had some difficulty with physical therapy yesterday   Objective: Vital signs in last 24 hours: Temp:  [98.2 F (36.8 C)-100.1 F (37.8 C)] 98.2 F (36.8 C) (11/25 0557) Pulse Rate:  [18-146] 109 (11/25 0557) Resp:  [15-18] 17 (11/25 0557) BP: (84-150)/(61-91) 128/75 (11/25 0557) SpO2:  [84 %-100 %] 89 % (11/25 0557)  Intake/Output from previous day: 11/24 0701 - 11/25 0700 In: 390 [P.O.:390] Out: 700 [Urine:700] Intake/Output this shift: Total I/O In: -  Out: 300 [Urine:300]  Recent Labs    11/05/22 0348 11/06/22 0338  HGB 11.7* 11.1*   Recent Labs    11/05/22 0348 11/06/22 0338  WBC 15.7* 13.3*  RBC 3.75* 3.45*  HCT 39.4 34.6*  PLT 173 149*   Recent Labs    11/05/22 0348 11/06/22 1600  NA 140 136  K 5.1 4.4  CL 104 98  CO2 29 29  BUN 12 12  CREATININE 0.73 0.76  GLUCOSE 132* 144*  CALCIUM 8.6* 8.8*   No results for input(s): "LABPT", "INR" in the last 72 hours.  Neurologically intact Neurovascular intact Sensation intact distally Intact pulses distally Dorsiflexion/Plantar flexion intact Incision: dressing C/D/I No cellulitis present Compartment soft   Assessment/Plan: 3 Days Post-Op Procedure(s) (LRB): TOTAL KNEE ARTHROPLASTY (Left) Up with therapy continue working with physical therapy, they were concerned about some lethargy that she had yesterday.  I was consulted yesterday about this and I recommended holding tramadol which is the only new medication that she has been taking.  See how she does.  Pain does not seem to be too bad that will be controlled with only Tylenol Most likely will be here tomorrow maybe on Monday    [facility] PA-C [facility] with Dr. Thomasena Edis [phone] 11/07/2022, 8:36 AM

## 2022-11-07 NOTE — Progress Notes (Deleted)
Pt. Refused Lovenox this evening 11/06/2022 2200.  Floor coverage made aware.

## 2022-11-07 NOTE — Plan of Care (Signed)
Plan of care reviewed and discussed. °

## 2022-11-07 NOTE — Progress Notes (Signed)
UA done for patient this afternoon due to change in patients alertness.  Came back positive for UTI  Will start patient on bactrim until culture results come back.  I spoke with nurse and she will get her started on antibiotics  Zadie Cleverly, PA-C

## 2022-11-07 NOTE — Plan of Care (Signed)
  Problem: Activity: Goal: Ability to avoid complications of mobility impairment will improve Outcome: Progressing Goal: Range of joint motion will improve Outcome: Progressing   Problem: Pain Management: Goal: Pain level will decrease with appropriate interventions Outcome: Progressing   Problem: Safety: Goal: Ability to remain free from injury will improve Outcome: Progressing   

## 2022-11-07 NOTE — Progress Notes (Addendum)
Physical Therapy Treatment Patient Details Name: Norma Anthony MRN: [id number] DOB: 1956-02-03 Today's Date: 11/07/2022   History of Present Illness 66 y.o. female admitted for L TKA 11/04/22. PMH: anxiety, COPD, migraines, sleep apnea, PE, IVC filter 09/28/22.    PT Comments    Pt still with significant confusion that limited.  Medical team did order urinalysis after PT session this morning and + for UTI.  Pt participating with some exercises and did stand at EOB with assist of 2 but unable to progress due to pain, safety, and confusion.  Visitor present did report pt had similar episode in past and was told due to oxygen deprivation (potentially while sleeping) - oxygen sats are stable at this time but did inform RN.  Continue plan of care as able.      Recommendations for follow up therapy are one component of a multi-disciplinary discharge planning process, led by the attending physician.  Recommendations may be updated based on patient status, additional functional criteria and insurance authorization.  Follow Up Recommendations  Follow physician's recommendations for discharge plan and follow up therapies     Assistance Recommended at Discharge Frequent or constant Supervision/Assistance  Patient can return home with the following A lot of help with walking and/or transfers;A lot of help with bathing/dressing/bathroom;Assistance with cooking/housework   Equipment Recommendations  None recommended by PT    Recommendations for Other Services       Precautions / Restrictions Precautions Precautions: Knee;Fall Precaution Booklet Issued: Yes (comment) Precaution Comments: reviewed no pillow under knee Restrictions LLE Weight Bearing: Weight bearing as tolerated     Mobility  Bed Mobility Overal bed mobility: Needs Assistance Bed Mobility: Supine to Sit, Sit to Supine     Supine to sit: Mod assist Sit to supine: Mod assist   General bed mobility comments: Increased time and  repeated cues for all    Transfers Overall transfer level: Needs assistance Equipment used: Rolling walker (2 wheels) Transfers: Sit to/from Stand Sit to Stand: Mod assist, +2 physical assistance           General transfer comment: Performed x 2 with bed elevated, repeated cues, use of momentum and mod A x 2.  Upon standing pt abruptly sitting both times due to confusion and pain.  Unable to progress further    Ambulation/Gait                   Stairs             Wheelchair Mobility    Modified Rankin (Stroke Patients Only)       Balance Overall balance assessment: Needs assistance Sitting-balance support: Bilateral upper extremity supported Sitting balance-Leahy Scale: Poor Sitting balance - Comments: requiring UE support and min guard   Standing balance support: Bilateral upper extremity supported, During functional activity, Reliant on assistive device for balance Standing balance-Leahy Scale: Poor Standing balance comment: Only stood briefly with assist and RW                            Cognition Arousal/Alertness: Awake/alert Behavior During Therapy: Flat affect Overall Cognitive Status: Impaired/Different from baseline Area of Impairment: Orientation, Attention, Memory, Following commands, Safety/judgement, Awareness, Problem solving                 Orientation Level: Disoriented to, Place, Time, Situation Current Attention Level: Focused Memory: Decreased recall of precautions, Decreased short-term memory Following Commands: Follows one step commands inconsistently  Awareness: Intellectual Problem Solving: Slow processing, Decreased initiation, Difficulty sequencing, Requires verbal cues, Requires tactile cues General Comments: Pt was lethargic but easily arousable. Pt very slow to respond.  Oriented to self and hospital this afternoon, but still very confused and slow to respond.  Pt with another visitor in room who reports  pt had similar episode a few years ago due to oxygen depreivation.        Exercises Total Joint Exercises Long Arc Quad: Left, 10 reps, Seated, AAROM Knee Flexion: AAROM, Left, 10 reps, Seated Goniometric ROM: L knee 5 to 60 degrees    General Comments General comments (skin integrity, edema, etc.): Pt remains confused.  VSS.  Visitor in room expressing concern about confusion. Discussed they are checking urinalysis.   She reports history of similar episode and testing revealed oxygen depreivation in the past - she wasn't exactly sure of details but reports sats possibly dropped with sleep.  Pt's O2 sats stable at this time 96% - did inform RN of report.      Pertinent Vitals/Pain Pain Assessment Pain Assessment: Faces Faces Pain Scale: Hurts even more Pain Location: L knee with standing Pain Descriptors / Indicators: Operative site guarding, Sore Pain Intervention(s): Limited activity within patient's tolerance, Monitored during session, Ice applied, Repositioned    Home Living                          Prior Function            PT Goals (current goals can now be found in the care plan section) Progress towards PT goals: Not progressing toward goals - comment (confusion)    Frequency    7X/week      PT Plan Current plan remains appropriate    Co-evaluation              AM-PAC PT "6 Clicks" Mobility   Outcome Measure  Help needed turning from your back to your side while in a flat bed without using bedrails?: A Lot Help needed moving from lying on your back to sitting on the side of a flat bed without using bedrails?: A Lot Help needed moving to and from a bed to a chair (including a wheelchair)?: Total Help needed standing up from a chair using your arms (e.g., wheelchair or bedside chair)?: Total Help needed to walk in hospital room?: Total Help needed climbing 3-5 steps with a railing? : Total 6 Click Score: 8    End of Session Equipment  Utilized During Treatment: Gait belt Activity Tolerance: Other (comment) (limited by pain and confusion) Patient left: with call bell/phone within reach;with family/visitor present;in bed;with bed alarm set Nurse Communication: Mobility status;Other (comment) (report from visitor of similar episode with oxygen depreviation in past; RN reports UA did contain bacteria) PT Visit Diagnosis: Difficulty in walking, not elsewhere classified (R26.2);Pain Pain - Right/Left: Left Pain - part of body: Knee     Time: 7619-5093 PT Time Calculation (min) (ACUTE ONLY): 24 min  Charges:  $Therapeutic Exercise: 8-22 mins $Therapeutic Activity: 8-22 mins                     Anise Salvo, PT Acute Rehab [facility] Re[facility] [phone]    Rayetta Humphrey 11/07/2022, 4:03 PM

## 2022-11-07 NOTE — Progress Notes (Signed)
Physical Therapy Treatment Patient Details Name: Norma Anthony MRN: [id number] DOB: [birth year] Today's Date: 11/07/2022   History of Present Illness 66 y.o. female admitted for L TKA 11/04/22. PMH: anxiety, COPD, migraines, sleep apnea, PE, IVC filter 09/28/22.    PT Comments    Very limited treatment due to new significant confusion.  Pt had new lethargy yesterday, but now confused.  Spouse reports began this morning.  Pt slowly able to recall name but unable to identify spouse.  She was unaware why she was in hospital or recall even after being told twice.  Very slow to respond needing repeated cues for transfers - unsafe to attempt standing due to confusion and not following commands.  Vital signs were stable and neuro checks were normal/equal bilaterally.   Last pain meds were yesterday morning, so unlikely contributing factor.  Notified PA via secure chat, will f/u further if no response.  Will f/u with pt this afternoon.    Recommendations for follow up therapy are one component of a multi-disciplinary discharge planning process, led by the attending physician.  Recommendations may be updated based on patient status, additional functional criteria and insurance authorization.  Follow Up Recommendations  Follow physician's recommendations for discharge plan and follow up therapies     Assistance Recommended at Discharge Intermittent Supervision/Assistance  Patient can return home with the following A little help with bathing/dressing/bathroom;Assistance with cooking/housework;Assist for transportation;Help with stairs or ramp for entrance;A little help with walking and/or transfers   Equipment Recommendations  None recommended by PT    Recommendations for Other Services       Precautions / Restrictions Precautions Precautions: Knee;Fall Precaution Comments: reviewed no pillow under knee Restrictions Weight Bearing Restrictions: No LLE Weight Bearing: Weight bearing as tolerated      Mobility  Bed Mobility Overal bed mobility: Needs Assistance Bed Mobility: Supine to Sit, Sit to Supine     Supine to sit: Mod assist Sit to supine: Mod assist        Transfers                   General transfer comment: not attempted due to not following commands; new confusion    Ambulation/Gait                   Stairs             Wheelchair Mobility    Modified Rankin (Stroke Patients Only)       Balance Overall balance assessment: Needs assistance Sitting-balance support: Bilateral upper extremity supported Sitting balance-Leahy Scale: Poor                                      Cognition Arousal/Alertness: Awake/alert Behavior During Therapy: Flat affect Overall Cognitive Status: Impaired/Different from baseline Area of Impairment: Orientation, Attention, Memory, Following commands, Safety/judgement, Awareness, Problem solving                 Orientation Level: Disoriented to, Place, Time, Situation Current Attention Level: Focused Memory: Decreased recall of precautions, Decreased short-term memory Following Commands: Follows one step commands inconsistently   Awareness: Intellectual Problem Solving: Slow processing, Decreased initiation, Difficulty sequencing, Requires verbal cues, Requires tactile cues General Comments: Pt was lethargic but easily arousable. Pt very slow to respond.  Initially stated she was "Norma Anthony" but later said "Norma Anthony."  She was unable to identify spouse in room or recall why she  was in hospital.  Told she had a TKA twice and still unable to recall. Very flat affect and follows commands inconsistently. Spouse reports confusion started thyis morning.  Noted last pain meds were yesterday morning.        Exercises      General Comments General comments (skin integrity, edema, etc.): BP was 143/78, HR 111 bpm, Sats 94%.  Neuro checks: sensation intact, smile equal, no drift in UE, equal  grips, tracking normal, ankle dorsiflexion equal bil (TKA on L so unable to SLR)      Pertinent Vitals/Pain Pain Assessment Pain Assessment: Faces Faces Pain Scale: No hurt    Home Living                          Prior Function            PT Goals (current goals can now be found in the care plan section) Progress towards PT goals: Not progressing toward goals - comment (confusion)    Frequency    7X/week      PT Plan Current plan remains appropriate    Co-evaluation              AM-PAC PT "6 Clicks" Mobility   Outcome Measure  Help needed turning from your back to your side while in a flat bed without using bedrails?: A Lot Help needed moving from lying on your back to sitting on the side of a flat bed without using bedrails?: A Lot Help needed moving to and from a bed to a chair (including a wheelchair)?: A Lot Help needed standing up from a chair using your arms (e.g., wheelchair or bedside chair)?: Total Help needed to walk in hospital room?: Total Help needed climbing 3-5 steps with a railing? : Total 6 Click Score: 9    End of Session   Activity Tolerance: Other (comment) (limited by confusion) Patient left: with call bell/phone within reach;with family/visitor present;in bed;with bed alarm set Nurse Communication: Mobility status;Other (comment) (new confusion) PT Visit Diagnosis: Difficulty in walking, not elsewhere classified (R26.2);Pain Pain - Right/Left: Left Pain - part of body: Knee     Time: 6789-3810 PT Time Calculation (min) (ACUTE ONLY): 17 min  Charges:  $Therapeutic Activity: 8-22 mins                     Anise Salvo, PT Acute Rehab [facility] Rehab [phone]    Rayetta Humphrey 11/07/2022, 9:38 AM

## 2022-11-08 DIAGNOSIS — G43909 Migraine, unspecified, not intractable, without status migrainosus: Secondary | ICD-10-CM | POA: Diagnosis not present

## 2022-11-08 DIAGNOSIS — K219 Gastro-esophageal reflux disease without esophagitis: Secondary | ICD-10-CM | POA: Diagnosis present

## 2022-11-08 DIAGNOSIS — J449 Chronic obstructive pulmonary disease, unspecified: Secondary | ICD-10-CM | POA: Diagnosis present

## 2022-11-08 DIAGNOSIS — I2510 Atherosclerotic heart disease of native coronary artery without angina pectoris: Secondary | ICD-10-CM | POA: Diagnosis present

## 2022-11-08 DIAGNOSIS — Z6841 Body Mass Index (BMI) 40.0 and over, adult: Secondary | ICD-10-CM | POA: Diagnosis not present

## 2022-11-08 DIAGNOSIS — M797 Fibromyalgia: Secondary | ICD-10-CM | POA: Diagnosis present

## 2022-11-08 DIAGNOSIS — B952 Enterococcus as the cause of diseases classified elsewhere: Secondary | ICD-10-CM | POA: Diagnosis not present

## 2022-11-08 DIAGNOSIS — Z86711 Personal history of pulmonary embolism: Secondary | ICD-10-CM | POA: Diagnosis not present

## 2022-11-08 DIAGNOSIS — F32A Depression, unspecified: Secondary | ICD-10-CM | POA: Diagnosis present

## 2022-11-08 DIAGNOSIS — G8929 Other chronic pain: Secondary | ICD-10-CM | POA: Diagnosis present

## 2022-11-08 DIAGNOSIS — M1712 Unilateral primary osteoarthritis, left knee: Secondary | ICD-10-CM | POA: Diagnosis present

## 2022-11-08 DIAGNOSIS — R410 Disorientation, unspecified: Secondary | ICD-10-CM | POA: Diagnosis not present

## 2022-11-08 DIAGNOSIS — M21062 Valgus deformity, not elsewhere classified, left knee: Secondary | ICD-10-CM | POA: Diagnosis present

## 2022-11-08 DIAGNOSIS — N390 Urinary tract infection, site not specified: Secondary | ICD-10-CM | POA: Diagnosis not present

## 2022-11-08 DIAGNOSIS — G4730 Sleep apnea, unspecified: Secondary | ICD-10-CM | POA: Diagnosis present

## 2022-11-08 DIAGNOSIS — Z95828 Presence of other vascular implants and grafts: Secondary | ICD-10-CM | POA: Diagnosis not present

## 2022-11-08 DIAGNOSIS — F419 Anxiety disorder, unspecified: Secondary | ICD-10-CM | POA: Diagnosis present

## 2022-11-08 DIAGNOSIS — R030 Elevated blood-pressure reading, without diagnosis of hypertension: Secondary | ICD-10-CM | POA: Diagnosis not present

## 2022-11-08 NOTE — Progress Notes (Signed)
Physical Therapy Treatment Patient Details Name: Norma Anthony MRN: [id number] DOB: Apr 23, 1956 Today's Date: 11/08/2022   History of Present Illness 66 y.o. female admitted for L TKA 11/04/22. PMH: anxiety, COPD, migraines, sleep apnea, PE, IVC filter 09/28/22.    PT Comments    Pt slightly more interactive this afternoon. Continues to require multi-modal cues and  hand over hand to follow basic commands, limited to transfers/unable to amb  d/t decr ability to follow commands.  Recommendations for follow up therapy are one component of a multi-disciplinary discharge planning process, led by the attending physician.  Recommendations may be updated based on patient status, additional functional criteria and insurance authorization.  Follow Up Recommendations  Follow physician's recommendations for discharge plan and follow up therapies     Assistance Recommended at Discharge Frequent or constant Supervision/Assistance  Patient can return home with the following A lot of help with walking and/or transfers;A lot of help with bathing/dressing/bathroom;Assistance with cooking/housework   Equipment Recommendations  None recommended by PT    Recommendations for Other Services       Precautions / Restrictions Precautions Precautions: Knee;Fall Precaution Comments: reviewed LLE position, blanket roll placed to avoid L hip external rotation and knee flexion Restrictions Weight Bearing Restrictions: No LLE Weight Bearing: Weight bearing as tolerated     Mobility  Bed Mobility Overal bed mobility: Needs Assistance Bed Mobility: Sit to Supine     Supine to sit: Min assist     General bed mobility comments: multi-modal cues, pt able to self assist with incr time    Transfers Overall transfer level: Needs assistance Equipment used: Rolling walker (2 wheels) Transfers: Sit to/from Stand, Bed to chair/wheelchair/BSC Sit to Stand: Mod assist, Min assist, +2 safety/equipment   Step  pivot transfers: Min assist, Mod assist, +2 safety/equipment       General transfer comment: repeated x2 wtih pt sitting almost immediately after 1st stand; assist to rise, multi-modal and hand over hand cues for foot and hand placement, sequence; improved stability on second trial and able to pivot back to bed    Ambulation/Gait               General Gait Details: pivotal steps only   Stairs             Wheelchair Mobility    Modified Rankin (Stroke Patients Only)       Balance   Sitting-balance support: Single extremity supported, Feet supported Sitting balance-Leahy Scale: Fair Sitting balance - Comments: close supervision for safety \   Standing balance support: Bilateral upper extremity supported, During functional activity, Reliant on assistive device for balance Standing balance-Leahy Scale: Poor Standing balance comment: UEs and external assist needed                            Cognition Arousal/Alertness: Awake/alert Behavior During Therapy: Flat affect Overall Cognitive Status: Impaired/Different from baseline Area of Impairment: Orientation, Attention, Memory, Following commands, Safety/judgement, Awareness, Problem solving                 Orientation Level: Disoriented to, Place, Time, Situation Current Attention Level: Focused Memory: Decreased recall of precautions, Decreased short-term memory Following Commands: Follows one step commands with increased time, Follows one step commands inconsistently   Awareness: Intellectual Problem Solving: Slow processing, Decreased initiation, Difficulty sequencing, Requires verbal cues, Requires tactile cues General Comments: pt requires incr time and multi-modal, hand over hand cues for basic tasks  Exercises Total Joint Exercises Ankle Circles/Pumps: AROM, Both, 5 reps Knee Flexion: AAROM, Left, Seated (seated knee flexion, able to incr ROM with incr time to ~  70 degress  flexion)    General Comments        Pertinent Vitals/Pain Pain Assessment Pain Assessment: Faces Faces Pain Scale: Hurts little more Pain Location: L knee with standing Pain Descriptors / Indicators: Discomfort, Grimacing, Guarding Pain Intervention(s): Limited activity within patient's tolerance, Premedicated before session, Monitored during session    Home Living                          Prior Function            PT Goals (current goals can now be found in the care plan section) Acute Rehab PT Goals Patient Stated Goal: travel to [facility] PT Goal Formulation: With patient/family Time For Goal Achievement: 11/04/22 Potential to Achieve Goals: Good Progress towards PT goals: Progressing toward goals    Frequency    7X/week      PT Plan Current plan remains appropriate    Co-evaluation              AM-PAC PT "6 Clicks" Mobility   Outcome Measure  Help needed turning from your back to your side while in a flat bed without using bedrails?: A Lot Help needed moving from lying on your back to sitting on the side of a flat bed without using bedrails?: A Lot Help needed moving to and from a bed to a chair (including a wheelchair)?: A Lot Help needed standing up from a chair using your arms (e.g., wheelchair or bedside chair)?: A Lot Help needed to walk in hospital room?: Total Help needed climbing 3-5 steps with a railing? : Total 6 Click Score: 10    End of Session Equipment Utilized During Treatment: Gait belt Activity Tolerance: Other (comment) (cognition/confusion) Patient left: with call bell/phone within reach;with family/visitor present;in bed;with bed alarm set   PT Visit Diagnosis: Difficulty in walking, not elsewhere classified (R26.2);Pain Pain - Right/Left: Left Pain - part of body: Knee     Time: 2633-3545 PT Time Calculation (min) (ACUTE ONLY): 26 min  Charges:  $Therapeutic Activity: 23-37 mins                     Delice Bison,  PT  Acute Rehab Dept [facility]) [phone]  [facility] Weekend Pager [facility] only)  [phone]  11/08/2022    [facility] 11/08/2022, 4:30 PM

## 2022-11-08 NOTE — Progress Notes (Signed)
   Subjective: 4 Days Post-Op Procedure(s) (LRB): TOTAL KNEE ARTHROPLASTY (Left)  Pt still having confusion this morning Treatment for UTI started yesterday with cultures pending Therapy has been limited due to confusion and pain Otherwise doing fair Family at the bedside Patient reports pain as mild.  Objective:   VITALS:   Vitals:   11/07/22 2212 11/08/22 0541  BP: (!) 154/68 (!) 159/93  Pulse: (!) 102 (!) 102  Resp: 18 16  Temp: 100.1 F (37.8 C) 98.4 F (36.9 C)  SpO2: 95% 94%    Left knee incision healing well Nv intact distally No rashes Slight edema distally Mild ecchymosis distally Guarded rom with flexion and log roll  LABS Recent Labs    11/06/22 0338  HGB 11.1*  HCT 34.6*  WBC 13.3*  PLT 149*    Recent Labs    11/06/22 1600  NA 136  K 4.4  BUN 12  CREATININE 0.76  GLUCOSE 144*     Assessment/Plan: 4 Days Post-Op Procedure(s) (LRB): TOTAL KNEE ARTHROPLASTY (Left) Continue antibiotics for UTI and will await cultures Continue PT/OT as able Pain management Will continue to monitor her progress     Alphonsa Overall PA-C, MPAS [facility] is now [facility] [employer] [facility] [address], [address], [place], [place] [address] Phone: [phone] [email or web address] Facebook  [employer]

## 2022-11-08 NOTE — Progress Notes (Signed)
Physical Therapy Treatment Patient Details Name: Norma Anthony MRN: [id number] DOB: May 08, 1956 Today's Date: 11/08/2022   History of Present Illness 66 y.o. female admitted for L TKA 11/04/22. PMH: anxiety, COPD, migraines, sleep apnea, PE, IVC filter 09/28/22.    PT Comments    Pt with continued confusion requiring multi-modal repetitious commands, awake however verbalizes very little. Able to take some steps to chair today with min/mod assist of 2 for safety.  Mobility remains limited by cognition/confusion. Family does not feel she is much better than yesterday " a little"  Recommendations for follow up therapy are one component of a multi-disciplinary discharge planning process, led by the attending physician.  Recommendations may be updated based on patient status, additional functional criteria and insurance authorization.  Follow Up Recommendations  Follow physician's recommendations for discharge plan and follow up therapies     Assistance Recommended at Discharge Frequent or constant Supervision/Assistance  Patient can return home with the following A lot of help with walking and/or transfers;A lot of help with bathing/dressing/bathroom;Assistance with cooking/housework   Equipment Recommendations  None recommended by PT    Recommendations for Other Services       Precautions / Restrictions Precautions Precautions: Knee;Fall Precaution Comments: reviewed LLE position, blanket roll placed to avoid L hip external rotation and knee flexion Restrictions Weight Bearing Restrictions: No LLE Weight Bearing: Weight bearing as tolerated     Mobility  Bed Mobility Overal bed mobility: Needs Assistance Bed Mobility: Supine to Sit     Supine to sit: Mod assist, +2 for safety/equipment     General bed mobility comments: Increased time and repeated step by step multi-modal cues to come to EOB    Transfers Overall transfer level: Needs assistance Equipment used: Rolling  walker (2 wheels) Transfers: Sit to/from Stand, Bed to chair/wheelchair/BSC Sit to Stand: Mod assist, Min assist, +2 safety/equipment   Step pivot transfers: Min assist, Mod assist, +2 safety/equipment       General transfer comment: repeated x2 wtih pt sitting almost immediately after 1st stand; assist to rise, multi-modal and hand over hand cues for foot and hand placement, sequence    Ambulation/Gait               General Gait Details: pivotal steps only   Stairs             Wheelchair Mobility    Modified Rankin (Stroke Patients Only)       Balance   Sitting-balance support: Single extremity supported, Feet supported Sitting balance-Leahy Scale: Fair Sitting balance - Comments: close supervision for safety \   Standing balance support: Bilateral upper extremity supported, During functional activity, Reliant on assistive device for balance Standing balance-Leahy Scale: Poor Standing balance comment: UEs and external assist needed                            Cognition Arousal/Alertness: Awake/alert Behavior During Therapy: Flat affect Overall Cognitive Status: Impaired/Different from baseline Area of Impairment: Orientation, Attention, Memory, Following commands, Safety/judgement, Awareness, Problem solving                 Orientation Level: Disoriented to, Place, Time, Situation Current Attention Level: Focused Memory: Decreased recall of precautions, Decreased short-term memory Following Commands: Follows one step commands inconsistently   Awareness: Intellectual Problem Solving: Slow processing, Decreased initiation, Difficulty sequencing, Requires verbal cues, Requires tactile cues General Comments: pt oriented only to self, does not know where she is, states the  man in her room is "Caryn Bee her husband", he is her husband but his name is Kathlene November. pt requires incr time and multi-modal, hand over hand cues for basic tasks         Exercises      General Comments        Pertinent Vitals/Pain Pain Assessment Pain Assessment: Faces Faces Pain Scale: Hurts little more Pain Location: L knee with standing Pain Descriptors / Indicators: Discomfort, Grimacing, Guarding Pain Intervention(s): Limited activity within patient's tolerance, Monitored during session, Repositioned    Home Living                          Prior Function            PT Goals (current goals can now be found in the care plan section) Acute Rehab PT Goals Patient Stated Goal: travel to [facility] PT Goal Formulation: With patient/family Time For Goal Achievement: 11/04/22 Potential to Achieve Goals: Good Progress towards PT goals: Progressing toward goals    Frequency    7X/week      PT Plan Current plan remains appropriate    Co-evaluation              AM-PAC PT "6 Clicks" Mobility   Outcome Measure  Help needed turning from your back to your side while in a flat bed without using bedrails?: A Lot Help needed moving from lying on your back to sitting on the side of a flat bed without using bedrails?: A Lot Help needed moving to and from a bed to a chair (including a wheelchair)?: A Lot Help needed standing up from a chair using your arms (e.g., wheelchair or bedside chair)?: A Lot Help needed to walk in hospital room?: Total Help needed climbing 3-5 steps with a railing? : Total 6 Click Score: 10    End of Session Equipment Utilized During Treatment: Gait belt Activity Tolerance: Other (comment) (cognition/confusion) Patient left: in chair;with call bell/phone within reach;with chair alarm set;with family/visitor present   PT Visit Diagnosis: Difficulty in walking, not elsewhere classified (R26.2);Pain Pain - Right/Left: Left Pain - part of body: Knee     Time: 2130-8657 PT Time Calculation (min) (ACUTE ONLY): 13 min  Charges:  $Therapeutic Activity: 8-22 mins                     Delice Bison, PT  Acute Rehab  Dept [facility]) [phone]  [facility] Weekend Pager [facility] only)  [phone]  11/08/2022    [facility] 11/08/2022, 10:45 AM

## 2022-11-09 ENCOUNTER — Encounter (HOSPITAL_COMMUNITY): Payer: Self-pay | Admitting: Specialist

## 2022-11-09 DIAGNOSIS — N390 Urinary tract infection, site not specified: Secondary | ICD-10-CM | POA: Diagnosis not present

## 2022-11-09 DIAGNOSIS — R030 Elevated blood-pressure reading, without diagnosis of hypertension: Secondary | ICD-10-CM | POA: Diagnosis not present

## 2022-11-09 DIAGNOSIS — R410 Disorientation, unspecified: Secondary | ICD-10-CM | POA: Diagnosis not present

## 2022-11-09 DIAGNOSIS — M797 Fibromyalgia: Secondary | ICD-10-CM

## 2022-11-09 LAB — COMPREHENSIVE METABOLIC PANEL
ALT: 27 U/L (ref 0–44)
AST: 25 U/L (ref 15–41)
Albumin: 3 g/dL — ABNORMAL LOW (ref 3.5–5.0)
Alkaline Phosphatase: 49 U/L (ref 38–126)
Anion gap: 11 (ref 5–15)
BUN: 16 mg/dL (ref 8–23)
CO2: 26 mmol/L (ref 22–32)
Calcium: 8.6 mg/dL — ABNORMAL LOW (ref 8.9–10.3)
Chloride: 100 mmol/L (ref 98–111)
Creatinine, Ser: 0.82 mg/dL (ref 0.44–1.00)
GFR, Estimated: >60 mL/min (ref >60)
Glucose, Bld: 134 mg/dL — ABNORMAL HIGH (ref 70–99)
Potassium: 4 mmol/L (ref 3.5–5.1)
Sodium: 137 mmol/L (ref 135–145)
Total Bilirubin: 1 mg/dL (ref 0.3–1.2)
Total Protein: 7.2 g/dL (ref 6.5–8.1)

## 2022-11-09 LAB — CBC
HCT: 35.2 % — ABNORMAL LOW (ref 36.0–46.0)
Hemoglobin: 11.5 g/dL — ABNORMAL LOW (ref 12.0–15.0)
MCH: 31.1 pg (ref 26.0–34.0)
MCHC: 32.7 g/dL (ref 30.0–36.0)
MCV: 95.1 fL (ref 80.0–100.0)
Platelets: 155 10*3/uL (ref 150–400)
RBC: 3.7 MIL/uL — ABNORMAL LOW (ref 3.87–5.11)
RDW: 13.9 % (ref 11.5–15.5)
WBC: 12 10*3/uL — ABNORMAL HIGH (ref 4.0–10.5)
nRBC: 0.8 % — ABNORMAL HIGH (ref 0.0–0.2)

## 2022-11-09 MED ORDER — RIVAROXABAN 10 MG PO TABS
20.0000 mg | ORAL_TABLET | Freq: Every day | ORAL | Status: DC
  Administered 2022-11-10: 20 mg via ORAL
  Filled 2022-11-09: qty 2

## 2022-11-09 MED ORDER — VANCOMYCIN HCL 1500 MG/300ML IV SOLN
1500.0000 mg | INTRAVENOUS | Status: DC
  Administered 2022-11-10: 1500 mg via INTRAVENOUS
  Filled 2022-11-09: qty 300

## 2022-11-09 MED ORDER — HYDRALAZINE HCL 25 MG PO TABS: 25.0000 mg | ORAL_TABLET | Freq: Four times a day (QID) | ORAL | Status: DC | PRN

## 2022-11-09 MED ORDER — VANCOMYCIN HCL 1500 MG/300ML IV SOLN
1500.0000 mg | Freq: Once | INTRAVENOUS | Status: AC
  Administered 2022-11-09: 1500 mg via INTRAVENOUS
  Filled 2022-11-09: qty 300

## 2022-11-09 MED ORDER — RIVAROXABAN 10 MG PO TABS: 10.0000 mg | ORAL_TABLET | Freq: Every day | ORAL | Status: DC

## 2022-11-09 MED ORDER — RIVAROXABAN 10 MG PO TABS
10.0000 mg | ORAL_TABLET | Freq: Once | ORAL | Status: AC
  Administered 2022-11-09: 10 mg via ORAL
  Filled 2022-11-09: qty 1

## 2022-11-09 NOTE — Progress Notes (Signed)
Pharmacy Antibiotic Note  Norma Anthony is a 66 y.o. female admitted on 11/04/2022 for total knee replacement; found to have enterococcal UTI.  Pharmacy has been consulted for vancomycin dosing.  Plan: Vancomycin 1500 mg IV q24 hr (est AUC 478 based on SCr 0.8; Vd 0.5) Measure vancomycin AUC at steady state as indicated SCr q48 while on vanc F/u sensitivities and narrow as appropriate   Height: 5\' 5"  (165.1 cm) Weight: 111.7 kg (246 lb 4.1 oz) IBW/kg (Calculated) : 57  Temp (24hrs), Avg:98.9 F (37.2 C), Min:98.4 F (36.9 C), Max:99.8 F (37.7 C)  Recent Labs  Lab 11/05/22 0348 11/06/22 0338 11/06/22 1600  WBC 15.7* 13.3*  --   CREATININE 0.73  --  0.76    Estimated Creatinine Clearance: 87.3 mL/min (by C-G formula based on SCr of 0.76 mg/dL).    Allergies  Allergen Reactions   Lyrica [Pregabalin] Other (See Comments)     Paralysis present (finding)  not tolerated Pt stated she could not move her body but she could think      Belbuca [Buprenorphine Hcl] Other (See Comments)    " Horrible headache"    Compazine [Prochlorperazine] Anxiety and Other (See Comments)    Other reaction(s): anxiety    Droperidol Anxiety    Other reaction(s): anxiety     Ergotamine-Caffeine Anxiety    Other reaction(s): Increased systolic arterial pressure (finding)     Thank you for allowing pharmacy to be a part of this patient's care.  Gilad Dugger A 11/09/2022 4:36 PM

## 2022-11-09 NOTE — Progress Notes (Signed)
Called phlebotomist to draw blood culture STAT. She stated she was unable to due to pt's veins. Waiting on next shift to draw.

## 2022-11-09 NOTE — Consult Note (Signed)
Initial Consultation Note   Patient: Norma Anthony HER:[id number] DOB: [birth year] PCP: [facility] DOA: 11/04/2022 DOS: the patient was seen and examined on 11/09/2022 Primary service: Jene Every, MD  Referring physician: Jene Every, MD Reason for consult: Confusion  Assessment/Plan:  Confusion Unclear etiology. Possibly related to positive urine culture. Patient empirically started on Bactrim which may have partially treated symptomatic bacteruria. Per husband, patient is close to baseline. Per primary team, patient is significantly improved from yesterday. -Treat urinary infections -Check blood cultures  UTI Patient without significant urinary symptoms at this time, but she has been on Bactrim. Confusion is better since starting antibiotic. Urine culture significant for Enterococcus faecalis (100,000) and GNRs (10,000). -Continue Bactrim for GNRs -Start Vancomycin for enterococcus faecalis -Follow-up urine culture sensitivities  Migraine headaches History. Possible migraine symptoms this admission but her headache is improved. She is not on chronic prophylaxis medication.  Elevated blood pressure No history of hypertension. Not on antihypertensive therapy. It is possible patient could have had a PRES presentation with confusion and hypertension. Blood pressure is improved today but not well controlled. Associated headache which is improved. -Continue to watch BPs -Hydralazine PO prn  Anxiety -Continue Alprazolam PRN and Cymbalta  Fibromyalgia Chronic pain -Continue gabapentin -Can resume buprenorphine on discharge  COPD Stable. Asymptomatic at this time. Patient is not managed on controller medication. -Continue albuterol PRN   TRH will continue to follow the patient.  HPI: Norma Anthony is a 66 y.o. 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 Symptoms started about three days prior to  consult. Per patient, she reports that she was told some of her behavior was not appropriate for the past few days but she is unable to elaborate much else. She reports a prior history of confusion in March 2022 which was attributed to hypoxia. Husband reports that three days ago the patient could not remember his name, could not remember where she was and was overall not acting her normal self. He reports he still thinks she has memory issues but her confusion has significant improved. Patient reports she has had a headache for the past few days which is improved. No associated nausea, vomiting, light sensitivity, chest pain or dyspnea.  Review of Systems: As mentioned in the history of present illness. All other systems reviewed and are negative. Past Medical History:  Diagnosis Date   Anxiety    CAD (coronary artery disease)    COPD (chronic obstructive pulmonary disease) (HCC)    Depression    Dyspnea    Fibromyalgia    GERD (gastroesophageal reflux disease)    Hiatal hernia    History of kidney stones    Migraine headache    Multinodular goiter    Nephrolithiasis    Pulmonary embolism (HCC) 1996   Sleep apnea    Thyroid nodule    Past Surgical History:  Procedure Laterality Date   ABDOMINAL HYSTERECTOMY     BIOPSY THYROID     HERNIA REPAIR     IVC FILTER INSERTION N/A 09/28/2022   Procedure: IVC FILTER INSERTION;  Surgeon: Maeola Harman, MD;  Location: [facility] INVASIVE CV LAB;  Service: Cardiovascular;  Laterality: N/A;   KIDNEY STONE SURGERY     LAPAROSCOPIC NISSEN FUNDOPLICATION     PAROTIDECTOMY     TONSILLECTOMY     Social History:  reports that she quit smoking about 20 months ago. Her smoking use included cigarettes. She has a 5.00  pack-year smoking history. She has never used smokeless tobacco. She reports that she does not currently use alcohol. She reports that she does not currently use drugs.  Allergies  Allergen Reactions   Lyrica [Pregabalin] Other (See  Comments)     Paralysis present (finding)  not tolerated Pt stated she could not move her body but she could think      Belbuca [Buprenorphine Hcl] Other (See Comments)    " Horrible headache"    Compazine [Prochlorperazine] Anxiety and Other (See Comments)    Other reaction(s): anxiety    Droperidol Anxiety    Other reaction(s): anxiety     Ergotamine-Caffeine Anxiety    Other reaction(s): Increased systolic arterial pressure (finding)     Family History  Problem Relation Age of Onset   Hypertension Mother     Prior to Admission medications   Medication Sig Start Date End Date Taking? Authorizing Provider  albuterol (VENTOLIN HFA) 108 (90 Base) MCG/ACT inhaler Inhale 2 puffs into the lungs every 6 (six) hours as needed for wheezing or shortness of breath. 05/12/21  Yes [provider]  ALPRAZolam Prudy Feeler) 1 MG tablet Take 1 mg by mouth 4 (four) times daily as needed for anxiety.   Yes [provider]  aspirin 81 MG EC tablet Take 81 mg by mouth daily.   Yes [provider]  Aspirin-Acetaminophen (GOODYS BACK & BODY PAIN) 500-325 MG PACK Take 1 packet by mouth 3 (three) times daily as needed (pain).   Yes [provider]  b complex vitamins capsule Take 1 capsule by mouth daily.   Yes [provider]  benzonatate (TESSALON) 100 MG capsule Take 1 capsule (100 mg total) by mouth every 8 (eight) hours. 07/05/22  Yes Arthor Captain, PA-C  Buprenorphine HCl (BELBUCA) 450 MCG FILM Place 450 mcg inside cheek in the morning and at bedtime.   Yes [provider]  Cholecalciferol (VITAMIN D3) 1.25 MG (50000 UT) CAPS Take 50,000 Units by mouth every Wednesday. 02/16/22  Yes [provider]  clindamycin-benzoyl peroxide (BENZACLIN) gel Apply 1 application  topically daily. 08/28/22  Yes [provider]  clotrimazole-betamethasone (LOTRISONE) cream Apply 1 Application topically 2 (two) times daily as needed (irritation). 03/09/22   Yes [provider]  diphenhydrAMINE (BENADRYL) 25 mg capsule Take 25 mg by mouth daily as needed for allergies.   Yes [provider]  docusate sodium (COLACE) 100 MG capsule Take 1 capsule (100 mg total) by mouth 2 (two) times daily as needed for mild constipation. 11/04/22  Yes Jene Every, MD  DULoxetine (CYMBALTA) 60 MG capsule Take 60 mg by mouth at bedtime. 09/14/18  Yes [provider]  famotidine (PEPCID) 20 MG tablet One after supper 04/30/22  Yes Nyoka Cowden, MD  Ferrous Fumarate-Folic Acid 324-1 MG TABS Take 1 tablet by mouth daily. Hematinic 02/29/20  Yes [provider]  furosemide (LASIX) 20 MG tablet Take 1 tablet (20 mg total) by mouth daily. Patient taking differently: Take 20 mg by mouth daily with supper. 08/27/22  Yes Meredeth Ide, Conner M, PA-C  gabapentin (NEURONTIN) 300 MG capsule Take 300-600 mg by mouth See admin instructions. Take 300 mg morning and and evening, take 600 mg at bedtime 08/21/22  Yes [provider]  ibuprofen (ADVIL) 200 MG tablet Take 400 mg by mouth every 8 (eight) hours as needed for moderate pain.   Yes [provider]  Meclizine HCl 25 MG CHEW Chew 25 mg by mouth 3 (three) times daily  as needed (dizziness).   Yes [provider]  melatonin 3 MG TABS tablet Take 3 mg by mouth at bedtime.   Yes [provider]  Multiple Vitamins-Minerals (MULTIVITAMIN GUMMIES ADULT PO) Take 1 tablet by mouth daily. With Zinc and vitamin C   Yes [provider]  naloxone (NARCAN) nasal spray 4 mg/0.1 mL Place 1 spray into the nose once as needed (accidental over dose). 10/16/21  Yes [provider]  pantoprazole (PROTONIX) 40 MG tablet Take 40 mg by mouth every morning. 06/30/16  Yes [provider]  polyethylene glycol (MIRALAX / GLYCOLAX) 17 g packet Take 17 g by mouth daily. 11/04/22  Yes Jene Every, MD  promethazine (PHENERGAN) 25 MG tablet Take 25 mg by mouth every 6  (six) hours as needed for nausea or vomiting. 03/03/22  Yes [provider]  salicylic acid-lactic acid 17 % external solution Apply 1 Application topically daily. Corn on foot   Yes [provider]  XARELTO 20 MG TABS tablet Take 20 mg by mouth daily. 08/21/21  Yes [provider]    Physical Exam: Vitals:   11/09/22 1300 11/09/22 1315 11/09/22 1341 11/09/22 1359  BP: (!) 169/124 (!) 165/109 (!) 145/99 (!) 149/77  Pulse: (!) 128 (!) 104 (!) 104 (!) 106  Resp:    18  Temp:    99.8 F (37.7 C)  TempSrc:      SpO2:    92%  Weight:      Height:       General exam: Appears calm and comfortable and in no acute distress. Conversant Respiratory: Clear to auscultation. Respiratory effort normal with no intercostal retractions or use of accessory muscles Cardiovascular: S1 & S2 heard, RRR. No murmurs, rubs, gallops or clicks. No significant LE edema Gastrointestinal: Abdomen is non-distended, soft and non-tender. No masses felt. normal bowel sounds heard Neurologic: No focal neurological deficits Musculoskeletal: No calf tenderness Skin: No cyanosis. No new rashes Psychiatry: Alert and oriented x4. Mood & affect appropriate   Data Reviewed:   Results are pending, will review when available.    Family Communication: Husband at bedside Primary team communication: Updated at bedside Thank you very much for involving [place] in the care of your patient.  Author: Jacquelin Hawking, MD 11/09/2022 4:14 PM  For on call review [email or web address].

## 2022-11-09 NOTE — Progress Notes (Signed)
Physical Therapy Treatment Patient Details Name: Norma Anthony MRN: [id number] DOB: [birth year] Today's Date: 11/09/2022   History of Present Illness 66 y.o. female admitted for L TKA 11/04/22. PMH: anxiety, COPD, migraines, sleep apnea, PE, IVC filter 09/28/22.    PT Comments    Pt continues to be limited by medical issues and cognition. BP 169/124 with HR 115-128 with bed to chair transfer. RN made aware. Pt states she is "on a starship", is aware she had knee surgery, cannot tell PT the date/year/day of week etc.  Pt seems to be having visual hallucinations during PT session.    Recommendations for follow up therapy are one component of a multi-disciplinary discharge planning process, led by the attending physician.  Recommendations may be updated based on patient status, additional functional criteria and insurance authorization.  Follow Up Recommendations  Follow physician's recommendations for discharge plan and follow up therapies     Assistance Recommended at Discharge Frequent or constant Supervision/Assistance  Patient can return home with the following A lot of help with walking and/or transfers;A lot of help with bathing/dressing/bathroom;Assistance with cooking/housework   Equipment Recommendations  None recommended by PT    Recommendations for Other Services       Precautions / Restrictions Precautions Precautions: Knee;Fall Precaution Booklet Issued: No Restrictions Other Position/Activity Restrictions: WBAT     Mobility  Bed Mobility   Bed Mobility: Supine to Sit     Supine to sit: Min guard     General bed mobility comments: multi-modal cues, pt able to self assist with incr time; min/guard for safety. dizzy with sitting, BP 169/124, HR 115    Transfers Overall transfer level: Needs assistance Equipment used: Rolling walker (2 wheels) Transfers: Sit to/from Stand, Bed to chair/wheelchair/BSC Sit to Stand: +2 safety/equipment, +2 physical assistance,  Min assist   Step pivot transfers: Min assist, +2 safety/equipment, +2 physical assistance       General transfer comment: repeated x2 wtih pt sitting almost immediately after 1st stand; assist to rise, multi-modal and hand over hand cues for foot and hand placement, sequence;HR 128 after transfer    Ambulation/Gait               General Gait Details: deferred d/t elevated BP   Stairs             Wheelchair Mobility    Modified Rankin (Stroke Patients Only)       Balance   Sitting-balance support: Single extremity supported, Feet supported Sitting balance-Leahy Scale: Fair Sitting balance - Comments: close supervision for safety   Standing balance support: Bilateral upper extremity supported, During functional activity, Reliant on assistive device for balance Standing balance-Leahy Scale: Poor Standing balance comment: UEs and external assist needed                            Cognition Arousal/Alertness: Awake/alert Behavior During Therapy: Flat affect Overall Cognitive Status: Impaired/Different from baseline Area of Impairment: Orientation, Attention, Memory, Following commands, Safety/judgement, Awareness, Problem solving                 Orientation Level: Disoriented to, Place, Time, Situation Current Attention Level: Focused Memory: Decreased recall of precautions, Decreased short-term memory Following Commands: Follows one step commands with increased time, Follows one step commands inconsistently Safety/Judgement: Decreased awareness of safety, Decreased awareness of deficits   Problem Solving: Slow processing, Decreased initiation, Difficulty sequencing, Requires verbal cues, Requires tactile cues General Comments: pt requires incr  time and multi-modal, hand over hand cues for basic tasks; she states we are on a "starship" and she is seeing "things move" in the room (?visual hallucinations)        Exercises Total Joint  Exercises Ankle Circles/Pumps: AROM, Both, 10 reps    General Comments        Pertinent Vitals/Pain Pain Assessment Pain Assessment: Faces Faces Pain Scale: Hurts a little bit Pain Location: L knee with standing Pain Descriptors / Indicators: Discomfort, Grimacing, Guarding Pain Intervention(s): Limited activity within patient's tolerance, Monitored during session, Premedicated before session, Repositioned    Home Living                          Prior Function            PT Goals (current goals can now be found in the care plan section) Acute Rehab PT Goals Patient Stated Goal: travel to [facility] PT Goal Formulation: With patient/family Time For Goal Achievement: 11/04/22 Potential to Achieve Goals: Good Progress towards PT goals: Progressing toward goals    Frequency    7X/week      PT Plan Current plan remains appropriate    Co-evaluation              AM-PAC PT "6 Clicks" Mobility   Outcome Measure  Help needed turning from your back to your side while in a flat bed without using bedrails?: A Lot Help needed moving from lying on your back to sitting on the side of a flat bed without using bedrails?: A Lot Help needed moving to and from a bed to a chair (including a wheelchair)?: A Lot Help needed standing up from a chair using your arms (e.g., wheelchair or bedside chair)?: A Lot Help needed to walk in hospital room?: Total Help needed climbing 3-5 steps with a railing? : Total 6 Click Score: 10    End of Session Equipment Utilized During Treatment: Gait belt Activity Tolerance: Treatment limited secondary to medical complications (Comment) Patient left: in chair;with call bell/phone within reach;with chair alarm set;with family/visitor present   PT Visit Diagnosis: Difficulty in walking, not elsewhere classified (R26.2);Pain Pain - Right/Left: Left Pain - part of body: Knee     Time: 7654-6503 PT Time Calculation (min) (ACUTE ONLY): 19  min  Charges:  $Therapeutic Activity: 8-22 mins                     Delice Bison, PT  Acute Rehab Dept [facility]) [phone]  [facility] Weekend Pager [facility] only)  [phone]  11/09/2022    [facility] 11/09/2022, 1:29 PM

## 2022-11-09 NOTE — Plan of Care (Signed)
  Problem: Activity: Goal: Ability to avoid complications of mobility impairment will improve Outcome: Progressing Goal: Range of joint motion will improve Outcome: Progressing   Problem: Pain Management: Goal: Pain level will decrease with appropriate interventions Outcome: Progressing   

## 2022-11-09 NOTE — Progress Notes (Signed)
11/09/22 1400  PT Visit Information  Last PT Received On 11/09/22 Pt progressing slowly, verbalizing more this pm, remains on purewick at all times, seated in chair. Reviewed TK HEP with pt needed multi-modal cues throughout for participation. Pt tells PT she was taking on the phone but did not call the person she meant to call. (She was on phone on PT arrival)  Assistance Needed +2  History of Present Illness 66 y.o. female admitted for L TKA 11/04/22. PMH: anxiety, COPD, migraines, sleep apnea, PE, IVC filter 09/28/22.  Subjective Data  Patient Stated Goal travel to [facility]  Precautions  Precautions Knee;Fall  Precaution Booklet Issued No  Restrictions  Other Position/Activity Restrictions WBAT  Pain Assessment  Pain Assessment Faces  Faces Pain Scale 2  Pain Location L knee with standing  Pain Descriptors / Indicators Discomfort;Grimacing;Guarding  Pain Intervention(s) Limited activity within patient's tolerance;Monitored during session;Repositioned (declines ice)  Cognition  Arousal/Alertness Awake/alert  Behavior During Therapy Flat affect  Overall Cognitive Status Impaired/Different from baseline  Area of Impairment Orientation;Attention;Memory;Following commands;Safety/judgement;Awareness;Problem solving  Orientation Level Disoriented to;Place;Time;Situation  Current Attention Level Focused  Memory Decreased recall of precautions;Decreased short-term memory  Following Commands Follows one step commands with increased time;Follows one step commands inconsistently  Safety/Judgement Decreased awareness of safety;Decreased awareness of deficits  Problem Solving Slow processing;Decreased initiation;Difficulty sequencing;Requires verbal cues;Requires tactile cues  General Comments pt requires incr time and multi-modal, hand over hand cues for basic tasks; she states we are on a "starship" and she is seeing "things move" in the room (?visual hallucinations)  Ambulation/Gait  General  Gait Details deferred d/t elevated BP  Balance  Sitting-balance support Single extremity supported;Feet supported  Sitting balance-Leahy Scale Fair  Sitting balance - Comments close supervision for safety  Standing balance support Bilateral upper extremity supported;During functional activity;Reliant on assistive device for balance  Standing balance-Leahy Scale Poor  Standing balance comment UEs and external assist needed  Total Joint Exercises  Ankle Circles/Pumps AROM;Both;10 reps  Quad Sets AROM;Both;10 reps  Heel Slides AAROM;Left;Supine;10 reps  Straight Leg Raises AAROM;Left;10 reps;Supine  Long Arc Quad Left;10 reps;Seated;AAROM  Knee Flexion AAROM;Left;Seated;10 reps  Goniometric ROM grossly 6 to 65 degrees  PT - End of Session  Equipment Utilized During Treatment Gait belt  Activity Tolerance Treatment limited secondary to medical complications (Comment)  Patient left in chair;with call bell/phone within reach;with chair alarm set;with family/visitor present   PT - Assessment/Plan  PT Plan Current plan remains appropriate  PT Visit Diagnosis Difficulty in walking, not elsewhere classified (R26.2);Pain  Pain - Right/Left Left  Pain - part of body Knee  PT Frequency (ACUTE ONLY) 7X/week  Follow Up Recommendations Follow physician's recommendations for discharge plan and follow up therapies  Assistance recommended at discharge Frequent or constant Supervision/Assistance  Patient can return home with the following A lot of help with walking and/or transfers;A lot of help with bathing/dressing/bathroom;Assistance with cooking/housework  PT equipment None recommended by PT  AM-PAC PT "6 Clicks" Mobility Outcome Measure (Version 2)  Help needed turning from your back to your side while in a flat bed without using bedrails? 2  Help needed moving from lying on your back to sitting on the side of a flat bed without using bedrails? 2  Help needed moving to and from a bed to a chair  (including a wheelchair)? 2  Help needed standing up from a chair using your arms (e.g., wheelchair or bedside chair)? 2  Help needed to walk in hospital room? 1  Help needed climbing 3-5 steps with a railing?  1  6 Click Score 10  Consider Recommendation of Discharge To: CIR/SNF/LTACH  Progressive Mobility  What is the highest level of mobility based on the progressive mobility assessment? Level 4 (Walks with assist in room) - Balance while marching in place and cannot step forward and back - Complete  PT Goal Progression  Progress towards PT goals Progressing toward goals  Acute Rehab PT Goals  PT Goal Formulation With patient/family  Time For Goal Achievement 11/04/22  Potential to Achieve Goals Good  PT Time Calculation  PT Start Time (ACUTE ONLY) 1428  PT Stop Time (ACUTE ONLY) 1438  PT Time Calculation (min) (ACUTE ONLY) 10 min  PT General Charges  $$ ACUTE PT VISIT 1 Visit  PT Treatments  $Therapeutic Exercise 8-22 mins

## 2022-11-09 NOTE — Progress Notes (Signed)
Notified lab to collect 2nd blood culture so IV ABT can be started.

## 2022-11-09 NOTE — Progress Notes (Addendum)
Subjective: 5 Days Post-Op Procedure(s) (LRB): TOTAL KNEE ARTHROPLASTY (Left) Patient reports pain as 3 on 0-10 scale.   Denies CP or SOB.  Voiding without difficulty. Positive flatus. Alert, oriented Objective: Vital signs in last 24 hours: Temp:  [98.4 F (36.9 C)-99.8 F (37.7 C)] 99.8 F (37.7 C) (11/27 1359) Pulse Rate:  [98-128] 106 (11/27 1359) Resp:  [16-18] 18 (11/27 1359) BP: (139-172)/(63-124) 149/77 (11/27 1359) SpO2:  [92 %-96 %] 92 % (11/27 1359)  Intake/Output from previous day: 11/26 0701 - 11/27 0700 In: 780 [P.O.:780] Out: 750 [Urine:750] Intake/Output this shift: Total I/O In: 480 [P.O.:480] Out: 950 [Urine:950]  No results for input(s): "HGB" in the last 72 hours. No results for input(s): "WBC", "RBC", "HCT", "PLT" in the last 72 hours. No results for input(s): "NA", "K", "CL", "CO2", "BUN", "CREATININE", "GLUCOSE", "CALCIUM" in the last 72 hours. No results for input(s): "LABPT", "INR" in the last 72 hours.  Neurologically intact ABD soft Sensation intact distally Intact pulses distally Incision: scant drainage No cellulitis present Compartment soft Moderate swelling UTI Cx strep fecalis   Assessment/Plan:  5 Days Post-Op Procedure(s) (LRB): TOTAL KNEE ARTHROPLASTY (Left) Up with therapy Much better today. Now oriented. Confusion Probably from UTI Consulted Hospitalist for Ab recommendations Obtain CBC, BMET to update Ok to increase Xarelto Discussed with Husband   Principal Problem:   S/P TKR (total knee replacement) using cement Active Problems:   S/P TKR (total knee replacement) not using cement      Javier Docker 11/09/2022, @[facility] 

## 2022-11-09 NOTE — Plan of Care (Signed)
°  Problem: Pain Management: °Goal: Pain level will decrease with appropriate interventions °Outcome: Progressing °  °Problem: Coping: °Goal: Level of anxiety will decrease °Outcome: Progressing °  °Problem: Safety: °Goal: Ability to remain free from injury will improve °Outcome: Progressing °  °

## 2022-11-09 NOTE — Progress Notes (Addendum)
   11/09/22 1300  Vitals  Temp 98.7 F (37.1 C)  Temp Source Oral  BP (!) 169/124  MAP (mmHg) 106  BP Location Right Arm  BP Method Automatic  Patient Position (if appropriate) Sitting  Pulse Rate (!) 128  Pulse Rate Source Dinamap  Resp 16  Level of Consciousness  Level of Consciousness Alert  MEWS COLOR  MEWS Score Color Yellow  Pain Assessment  Pain Scale 0-10  Pain Score 0  MEWS Score  MEWS Temp 0  MEWS Systolic 0  MEWS Pulse 2  MEWS RR 0  MEWS LOC 0  MEWS Score 2  Provider Notification  Provider Name/Title Haus/Jackie/Beane  Date Provider Notified 11/09/22  Time Provider Notified 1345 (Dr. Shelle Iron 1521)  Method of Notification Call (text)  Notification Reason Other (Comment) (elevated BP and HR)  Provider response At bedside;See new orders;Evaluate remotely  Date of Provider Response 11/09/22  Time of Provider Response 1530   Pt's alert and confused, on ABT for UTI.  BP and HR elevated after transferring from bed to chair. Notified MD and new order noted.

## 2022-11-10 DIAGNOSIS — R030 Elevated blood-pressure reading, without diagnosis of hypertension: Secondary | ICD-10-CM | POA: Diagnosis not present

## 2022-11-10 DIAGNOSIS — N390 Urinary tract infection, site not specified: Secondary | ICD-10-CM | POA: Diagnosis not present

## 2022-11-10 LAB — URINE CULTURE: Culture: >=100000 — AB

## 2022-11-10 LAB — CREATININE, SERUM
Creatinine, Ser: 0.74 mg/dL (ref 0.44–1.00)
GFR, Estimated: >60 mL/min (ref >60)

## 2022-11-10 MED ORDER — BISACODYL 10 MG RE SUPP
10.0000 mg | Freq: Once | RECTAL | Status: AC
  Administered 2022-11-10: 10 mg via RECTAL
  Filled 2022-11-10: qty 1

## 2022-11-10 MED ORDER — MAGNESIUM CITRATE PO SOLN
1.0000 | Freq: Once | ORAL | Status: AC
  Administered 2022-11-10: 1 via ORAL
  Filled 2022-11-10: qty 296

## 2022-11-10 MED ORDER — AMOXICILLIN 500 MG PO CAPS
500.0000 mg | ORAL_CAPSULE | Freq: Three times a day (TID) | ORAL | Status: DC
  Administered 2022-11-10 (×2): 500 mg via ORAL
  Filled 2022-11-10 (×3): qty 1

## 2022-11-10 MED ORDER — AMOXICILLIN 500 MG PO CAPS: 500.0000 mg | ORAL_CAPSULE | Freq: Three times a day (TID) | ORAL | 1 refills | Status: DC

## 2022-11-10 MED ORDER — OXYCODONE HCL 5 MG PO TABS: 5.0000 mg | ORAL_TABLET | ORAL | 0 refills | Status: DC | PRN

## 2022-11-10 NOTE — Care Management Important Message (Signed)
Important Message  Patient Details IM Letter given. Name: Norma Anthony MRN: [id number] Date of Birth: Feb 09, 1956   Medicare Important Message Given:  Yes     Caren Macadam 11/10/2022, 1:23 PM

## 2022-11-10 NOTE — Progress Notes (Signed)
Subjective: 6 Days Post-Op Procedure(s) (LRB): TOTAL KNEE ARTHROPLASTY (Left) Patient reports pain as 3 on 0-10 scale.   Denies CP or SOB.  Voiding without difficulty. Positive flatus. Objective: Vital signs in last 24 hours: Temp:  [97.9 F (36.6 C)-99.8 F (37.7 C)] 99.3 F (37.4 C) (11/28 6945) Pulse Rate:  [98-128] 107 (11/28 0633) Resp:  [16-18] 17 (11/28 0388) BP: (137-169)/(77-124) 137/77 (11/28 0633) SpO2:  [92 %-100 %] 93 % (11/28 8280)  Intake/Output from previous day: 11/27 0701 - 11/28 0700 In: 1174.2 [P.O.:870; IV Piggyback:304.2] Out: 1675 [Urine:1675] Intake/Output this shift: No intake/output data recorded.  Recent Labs    11/09/22 1620  HGB 11.5*   Recent Labs    11/09/22 1620  WBC 12.0*  RBC 3.70*  HCT 35.2*  PLT 155   Recent Labs    11/09/22 1735 11/10/22 0452  NA 137  --   K 4.0  --   CL 100  --   CO2 26  --   BUN 16  --   CREATININE 0.82 0.74  GLUCOSE 134*  --   CALCIUM 8.6*  --    No results for input(s): "LABPT", "INR" in the last 72 hours.  Neurologically intact ABD soft Neurovascular intact Intact pulses distally Dorsiflexion/Plantar flexion intact Incision: scant drainage No DVT  Assessment/Plan:  6 Days Post-Op Procedure(s) (LRB): TOTAL KNEE ARTHROPLASTY (Left) Back to baseline Up with therapy Discharge home with home health if cleared by medicine. Need rec on antibiotic F/U 0ne week Dr. Shelle Iron   Principal Problem:   S/P TKR (total knee replacement) using cement Active Problems:   S/P TKR (total knee replacement) not using cement      Javier Docker 11/10/2022, @[facility] 

## 2022-11-10 NOTE — Progress Notes (Signed)
Physical Therapy Treatment Patient Details Name: Norma Anthony MRN: [id number] DOB: [birth year] Today's Date: 11/10/2022   History of Present Illness 66 y.o. female admitted for L TKA 11/04/22. PMH: anxiety, COPD, migraines, sleep apnea, PE, IVC filter 09/28/22.    PT Comments    Pt progressing, improved interaction/verbalization, able to follow basic commands with incr time. Will see again for second session, pt is hopeful to d/c home today  Recommendations for follow up therapy are one component of a multi-disciplinary discharge planning process, led by the attending physician.  Recommendations may be updated based on patient status, additional functional criteria and insurance authorization.  Follow Up Recommendations  Follow physician's recommendations for discharge plan and follow up therapies     Assistance Recommended at Discharge Frequent or constant Supervision/Assistance  Patient can return home with the following A lot of help with walking and/or transfers;A lot of help with bathing/dressing/bathroom;Assistance with cooking/housework   Equipment Recommendations  None recommended by PT    Recommendations for Other Services       Precautions / Restrictions Precautions Precautions: Knee;Fall Precaution Booklet Issued: No Restrictions Weight Bearing Restrictions: No Other Position/Activity Restrictions: WBAT     Mobility  Bed Mobility   Bed Mobility: Supine to Sit     Supine to sit: Min guard     General bed mobility comments: incr time, cues for task completion    Transfers Overall transfer level: Needs assistance Equipment used: Rolling walker (2 wheels) Transfers: Sit to/from Stand Sit to Stand: Min assist           General transfer comment: cues for hand placement    Ambulation/Gait Ambulation/Gait assistance: Min guard Gait Distance (Feet): 15 Feet Assistive device: Rolling walker (2 wheels) Gait Pattern/deviations: Step-to pattern, Decreased  step length - right, Decreased step length - left Gait velocity: decr     General Gait Details: beginning step through, min assist for RW direction and balance   Stairs             Wheelchair Mobility    Modified Rankin (Stroke Patients Only)       Balance   Sitting-balance support: Single extremity supported, Feet supported Sitting balance-Leahy Scale: Fair Sitting balance - Comments: close supervision for safety   Standing balance support: Bilateral upper extremity supported, During functional activity, Reliant on assistive device for balance Standing balance-Leahy Scale: Poor Standing balance comment: UEs and external assist needed                            Cognition Arousal/Alertness: Awake/alert Behavior During Therapy: Flat affect   Area of Impairment: Attention, Memory, Following commands, Safety/judgement, Awareness, Problem solving                   Current Attention Level: Focused Memory: Decreased recall of precautions, Decreased short-term memory Following Commands: Follows one step commands with increased time Safety/Judgement: Decreased awareness of safety, Decreased awareness of deficits   Problem Solving: Slow processing, Decreased initiation, Difficulty sequencing, Requires verbal cues, Requires tactile cues General Comments: pt requires incr time and multi-modal cues for basic tasks. pt is Ox4 this am        Exercises Total Joint Exercises Ankle Circles/Pumps: AROM, Both, 10 reps    General Comments        Pertinent Vitals/Pain Pain Assessment Pain Assessment: Faces Faces Pain Scale: Hurts a little bit Pain Location: L knee with standing Pain Descriptors / Indicators: Discomfort, Grimacing, Guarding Pain  Intervention(s): Limited activity within patient's tolerance, Monitored during session, Premedicated before session, Repositioned    Home Living                          Prior Function             PT Goals (current goals can now be found in the care plan section) Acute Rehab PT Goals Patient Stated Goal: travel to [facility] PT Goal Formulation: With patient/family Time For Goal Achievement: 11/04/22 Potential to Achieve Goals: Good Progress towards PT goals: Progressing toward goals    Frequency    7X/week      PT Plan      Co-evaluation              AM-PAC PT "6 Clicks" Mobility   Outcome Measure  Help needed turning from your back to your side while in a flat bed without using bedrails?: A Little Help needed moving from lying on your back to sitting on the side of a flat bed without using bedrails?: A Little Help needed moving to and from a bed to a chair (including a wheelchair)?: A Little Help needed standing up from a chair using your arms (e.g., wheelchair or bedside chair)?: A Little Help needed to walk in hospital room?: A Little Help needed climbing 3-5 steps with a railing? : Total 6 Click Score: 16    End of Session Equipment Utilized During Treatment: Gait belt Activity Tolerance: Patient tolerated treatment well Patient left: with family/visitor present;Other (comment) (batrhoom, RN aware) Nurse Communication: Mobility status PT Visit Diagnosis: Difficulty in walking, not elsewhere classified (R26.2);Pain Pain - Right/Left: Left Pain - part of body: Knee     Time: 0950-1006 PT Time Calculation (min) (ACUTE ONLY): 16 min  Charges:  $Gait Training: 8-22 mins                     Delice Bison, PT  Acute Rehab Dept [facility]) [phone]  [facility] Weekend Pager [facility] only)  [phone]  11/10/2022    [facility] 11/10/2022, 11:10 AM

## 2022-11-10 NOTE — Plan of Care (Signed)
Problem: Education: Goal: Knowledge of the prescribed therapeutic regimen will improve Outcome: Adequate for Discharge   Problem: Activity: Goal: Ability to avoid complications of mobility impairment will improve Outcome: Adequate for Discharge Goal: Range of joint motion will improve Outcome: Adequate for Discharge   Problem: Clinical Measurements: Goal: Postoperative complications will be avoided or minimized Outcome: Adequate for Discharge   Problem: Pain Management: Goal: Pain level will decrease with appropriate interventions Outcome: Adequate for Discharge   Problem: Skin Integrity: Goal: Will show signs of wound healing Outcome: Adequate for Discharge   Problem: Education: Goal: Knowledge of General Education information will improve Description: Including pain rating scale, medication(s)/side effects and non-pharmacologic comfort measures Outcome: Adequate for Discharge   Problem: Health Behavior/Discharge Planning: Goal: Ability to manage health-related needs will improve Outcome: Adequate for Discharge   Problem: Clinical Measurements: Goal: Ability to maintain clinical measurements within normal limits will improve Outcome: Adequate for Discharge Goal: Will remain free from infection Outcome: Adequate for Discharge Goal: Diagnostic test results will improve Outcome: Adequate for Discharge Goal: Respiratory complications will improve Outcome: Adequate for Discharge Goal: Cardiovascular complication will be avoided Outcome: Adequate for Discharge   Problem: Activity: Goal: Risk for activity intolerance will decrease Outcome: Adequate for Discharge   Problem: Nutrition: Goal: Adequate nutrition will be maintained Outcome: Adequate for Discharge   Problem: Coping: Goal: Level of anxiety will decrease Outcome: Adequate for Discharge   Problem: Elimination: Goal: Will not experience complications related to bowel motility Outcome: Adequate for  Discharge   Problem: Pain Managment: Goal: General experience of comfort will improve Outcome: Adequate for Discharge   Problem: Safety: Goal: Ability to remain free from injury will improve Outcome: Adequate for Discharge   Problem: Skin Integrity: Goal: Risk for impaired skin integrity will decrease Outcome: Adequate for Discharge   Problem: Education: Goal: Knowledge of the prescribed therapeutic regimen will improve Outcome: Adequate for Discharge   Problem: Activity: Goal: Ability to avoid complications of mobility impairment will improve Outcome: Adequate for Discharge Goal: Range of joint motion will improve Outcome: Adequate for Discharge   Problem: Clinical Measurements: Goal: Postoperative complications will be avoided or minimized Outcome: Adequate for Discharge   Problem: Pain Management: Goal: Pain level will decrease with appropriate interventions Outcome: Adequate for Discharge   Problem: Skin Integrity: Goal: Will show signs of wound healing Outcome: Adequate for Discharge   Problem: Education: Goal: Knowledge of General Education information will improve Description: Including pain rating scale, medication(s)/side effects and non-pharmacologic comfort measures Outcome: Adequate for Discharge   Problem: Health Behavior/Discharge Planning: Goal: Ability to manage health-related needs will improve Outcome: Adequate for Discharge   Problem: Clinical Measurements: Goal: Ability to maintain clinical measurements within normal limits will improve Outcome: Adequate for Discharge Goal: Will remain free from infection Outcome: Adequate for Discharge Goal: Diagnostic test results will improve Outcome: Adequate for Discharge Goal: Respiratory complications will improve Outcome: Adequate for Discharge Goal: Cardiovascular complication will be avoided Outcome: Adequate for Discharge   Problem: Activity: Goal: Risk for activity intolerance will  decrease Outcome: Adequate for Discharge   Problem: Nutrition: Goal: Adequate nutrition will be maintained Outcome: Adequate for Discharge   Problem: Coping: Goal: Level of anxiety will decrease Outcome: Adequate for Discharge   Problem: Elimination: Goal: Will not experience complications related to bowel motility Outcome: Adequate for Discharge Goal: Will not experience complications related to urinary retention Outcome: Adequate for Discharge   Problem: Pain Managment: Goal: General experience of comfort will improve Outcome: Adequate for Discharge  Problem: Safety: Goal: Ability to remain free from injury will improve Outcome: Adequate for Discharge   Problem: Skin Integrity: Goal: Risk for impaired skin integrity will decrease Outcome: Adequate for Discharge

## 2022-11-10 NOTE — Hospital Course (Signed)
Norma Anthony is a 66 y.o. 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 Medicine consulted for new onset confusion after surgery, likely related to urinary infection. Confusion is improved. Bactrim and Amoxicillin for UTI.

## 2022-11-10 NOTE — Progress Notes (Signed)
PROGRESS NOTE    Norma Anthony  DQQ:[id number] DOB: 19-Jan-1956 DOA: 11/04/2022 PCP: [facility]   Brief Narrative: Norma Anthony is a 66 y.o. 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 Medicine consulted for new onset confusion after surgery, likely related to urinary infection. Confusion is improved. Bactrim and Amoxicillin for UTI.   Assessment and Plan:  Confusion Unclear etiology. Possibly related to positive urine culture. Patient empirically started on Bactrim which may have partially treated symptomatic bacteruria. Per husband, patient is close to baseline. Per primary team, patient is significantly improved. Appears to be resolved. Blood cultures with no growth to date (<12 hours)   UTI Patient without significant urinary symptoms at this time, but she has been on Bactrim. Confusion is better since starting antibiotic. Urine culture significant for Enterococcus faecalis (100,000) and Klebsiella pneumoniae (10,000). Patient started empirically on Vancomycin for enterococcus species infection -Continue Bactrim x5 days total duration -Discontinue Vancomycin and start amoxicillin with recommendation for 5 days of treatment.   Migraine headaches History. Possible migraine symptoms this admission but her headache is improved. She is not on chronic prophylaxis medication.   Elevated blood pressure No history of hypertension. Not on antihypertensive therapy. It is possible patient could have had a PRES presentation with confusion and hypertension. Blood pressure appears to be improving. -Continue to watch BPs -Hydralazine PO prn -PCP follow-up for blood pressure  Anxiety -Continue Alprazolam PRN and Cymbalta   Fibromyalgia Chronic pain Patient states she stopped buprenorphine as an outpatient. -Continue gabapentin   COPD Stable. Asymptomatic at this time. Patient is not managed on controller  medication. -Continue albuterol PRN  IV infiltration No new IV required from a medicine standpoint -Supportive care   DVT prophylaxis: Per primary Code Status:   Code Status: Full Code Family Communication: Husband at bedside Disposition Plan: Per primary. No barriers to discharge from a general medicine standpoint   Antimicrobials: Bactrim Vancomycin Amoxicillin    Subjective: Patient reports no issues. Headache is improved.  Objective: BP 137/77 (BP Location: Right Arm)   Pulse (!) 107   Temp 99.3 F (37.4 C) (Oral)   Resp 17   Ht 5\' 5"  (1.651 m)   Wt 111.7 kg   SpO2 93%   BMI 40.98 kg/m   Examination:  General exam: Appears calm and comfortable Respiratory system: Clear to auscultation. Respiratory effort normal. Cardiovascular system: S1 & S2 heard, RRR. No murmurs, rubs, gallops or clicks. Gastrointestinal system: Abdomen is nondistended, soft and nontender.  Normal bowel sounds heard. Central nervous system: Alert and oriented. No focal neurological deficits. Skin: No cyanosis. No rashes Psychiatry: Judgement and insight appear normal. Mood & affect appropriate.    Data Reviewed: I have personally reviewed following labs and imaging studies  CBC Lab Results  Component Value Date   WBC 12.0 (H) 11/09/2022   RBC 3.70 (L) 11/09/2022   HGB 11.5 (L) 11/09/2022   HCT 35.2 (L) 11/09/2022   MCV 95.1 11/09/2022   MCH 31.1 11/09/2022   PLT 155 11/09/2022   MCHC 32.7 11/09/2022   RDW 13.9 11/09/2022   LYMPHSABS 1.6 08/27/2022   MONOABS 0.5 08/27/2022   EOSABS 0.3 08/27/2022   BASOSABS 0.1 08/27/2022     Last metabolic panel Lab Results  Component Value Date   NA 137 11/09/2022   K 4.0 11/09/2022   CL 100 11/09/2022   CO2 26 11/09/2022   BUN 16 11/09/2022   CREATININE 0.74  11/10/2022   GLUCOSE 134 (H) 11/09/2022   GFRNONAA >60 11/10/2022   CALCIUM 8.6 (L) 11/09/2022   PROT 7.2 11/09/2022   ALBUMIN 3.0 (L) 11/09/2022   BILITOT 1.0 11/09/2022    ALKPHOS 49 11/09/2022   AST 25 11/09/2022   ALT 27 11/09/2022   ANIONGAP 11 11/09/2022    GFR: Estimated Creatinine Clearance: 87.3 mL/min (by C-G formula based on SCr of 0.74 mg/dL).  Recent Results (from the past 240 hour(s))  Urine Culture     Status: Abnormal   Collection Time: 11/07/22  1:04 PM   Specimen: Urine, Clean Catch  Result Value Ref Range Status   Specimen Description   Final    URINE, CLEAN CATCH Performed at [facility], [address] [place], [place] [address]    Special Requests   Final    NONE Performed at [facility], [address] [place], [place] [address]    Culture (A)  Final    >=100,000 COLONIES/mL ENTEROCOCCUS FAECALIS 10,000 COLONIES/mL KLEBSIELLA PNEUMONIAE    Report Status 11/10/2022 FINAL  Final   Organism ID, Bacteria ENTEROCOCCUS FAECALIS (A)  Final   Organism ID, Bacteria KLEBSIELLA PNEUMONIAE (A)  Final      Susceptibility   Enterococcus faecalis - MIC*    AMPICILLIN <=2 SENSITIVE Sensitive     NITROFURANTOIN <=16 SENSITIVE Sensitive     VANCOMYCIN 1 SENSITIVE Sensitive     * >=100,000 COLONIES/mL ENTEROCOCCUS FAECALIS   Klebsiella pneumoniae - MIC*    AMPICILLIN RESISTANT Resistant     CEFAZOLIN <=4 SENSITIVE Sensitive     CEFEPIME <=0.12 SENSITIVE Sensitive     CEFTRIAXONE <=0.25 SENSITIVE Sensitive     CIPROFLOXACIN <=0.25 SENSITIVE Sensitive     GENTAMICIN <=1 SENSITIVE Sensitive     IMIPENEM <=0.25 SENSITIVE Sensitive     NITROFURANTOIN 64 INTERMEDIATE Intermediate     TRIMETH/SULFA <=20 SENSITIVE Sensitive     AMPICILLIN/SULBACTAM <=2 SENSITIVE Sensitive     PIP/TAZO <=4 SENSITIVE Sensitive     * 10,000 COLONIES/mL KLEBSIELLA PNEUMONIAE  Culture, blood (Routine X 2) w Reflex to ID Panel     Status: None (Preliminary result)   Collection Time: 11/09/22  5:33 PM   Specimen: BLOOD LEFT HAND  Result Value Ref Range Status   Specimen Description   Final    BLOOD LEFT  HAND Performed at [facility], [address], [place], [place] [address]    Special Requests   Final    BOTTLES DRAWN AEROBIC ONLY Blood Culture adequate volume Performed at [facility], [address], [place], [place] [address]    Culture   Final    NO GROWTH < 12 HOURS Performed at [facility], [address] [place], [place] [address]    Report Status PENDING  Incomplete      Radiology Studies: No results found.    LOS: 2 days    Jacquelin Hawking, MD [facility] 11/10/2022, 11:36 AM   If 7PM-7AM, please contact night-coverage [email or web address]

## 2022-11-10 NOTE — Progress Notes (Signed)
11/10/22 1132  PT Visit Information  Last PT Received On 11/10/22  Assistance Needed Pt continues to progress, able recognize PT and volunteer from earlier session, Ox3 however requires occasional redirection to current task. Reviewed amb safety and stairs, pt husband able to provide necessary cues and assist as needed during session. Pt and husband both report they feel ready to d/c home.  Ok to d/c from PT standpoint, has 24hr assist. Recommend pt/husband use gait belt initially for safety.   History of Present Illness 66 y.o. female admitted for L TKA 11/04/22. PMH: anxiety, COPD, migraines, sleep apnea, PE, IVC filter 09/28/22.  Subjective Data  Patient Stated Goal travel to [facility]  Precautions  Precautions Knee;Fall  Precaution Booklet Issued No  Restrictions  Weight Bearing Restrictions No  Other Position/Activity Restrictions WBAT  Pain Assessment  Pain Assessment Faces  Faces Pain Scale 2  Pain Location L knee with standing  Pain Descriptors / Indicators Discomfort;Grimacing;Guarding  Pain Intervention(s) Limited activity within patient's tolerance;Monitored during session;Premedicated before session;Repositioned  Cognition  Arousal/Alertness Awake/alert  Area of Impairment Attention;Memory;Following commands;Safety/judgement;Awareness;Problem solving  Current Attention Level Focused  Memory Decreased recall of precautions  Following Commands Follows one step commands consistently;Follows multi-step commands with increased time  Safety/Judgement Decreased awareness of safety;Decreased awareness of deficits  Problem Solving Difficulty sequencing;Requires verbal cues;Requires tactile cues  General Comments pt requires incr time and multi-modal cues for basic tasks. pt is Ox4 this am  Bed Mobility  General bed mobility comments in recliner  Transfers  Overall transfer level Needs assistance  Equipment used Rolling walker (2 wheels)  Transfers Sit to/from Stand  Sit to Stand Min  guard;Supervision  General transfer comment cues for hand placement, no physical assist; husband able to cue pt as needed  Ambulation/Gait  Ambulation/Gait assistance Min guard  Gait Distance (Feet) 100 Feet  Assistive device Rolling walker (2 wheels)  Gait Pattern/deviations Step-through pattern;Decreased stride length;Narrow base of support;Trunk flexed  General Gait Details min/guard to supervision for safety. cues to move more slowly and for correct RW position and trunk extension. husband able to provide appropriate safety cues  Gait velocity decr  Stairs Yes  Stairs assistance Min guard  Stair Management No rails;Step to pattern;Forwards;With walker  Number of Stairs 3  General stair comments spouse present. states that he made single step into 2 steps so they are shorter rise rather than one tall step. pt requires cues for technique and safety. husband able to provide necessary cues/assist as needed  Balance  Sitting-balance support Single extremity supported;Feet supported  Sitting balance-Leahy Scale Fair  Sitting balance - Comments close supervision for safety  Standing balance support Bilateral upper extremity supported;During functional activity;Reliant on assistive device for balance  Standing balance-Leahy Scale Poor  Standing balance comment UEs and external assist needed  PT - End of Session  Equipment Utilized During Treatment Gait belt  Activity Tolerance Patient tolerated treatment well  Patient left with family/visitor present;in chair;with call bell/phone within reach;with chair alarm set  Nurse Communication Mobility status   PT - Assessment/Plan  PT Visit Diagnosis Difficulty in walking, not elsewhere classified (R26.2);Pain  Pain - Right/Left Left  Pain - part of body Knee  PT Frequency (ACUTE ONLY) 7X/week  Follow Up Recommendations Follow physician's recommendations for discharge plan and follow up therapies  Assistance recommended at discharge Frequent or  constant Supervision/Assistance  Patient can return home with the following A lot of help with walking and/or transfers;A lot of help with bathing/dressing/bathroom;Assistance with cooking/housework  PT equipment None recommended by PT  AM-PAC PT "6 Clicks" Mobility Outcome Measure (Version 2)  Help needed turning from your back to your side while in a flat bed without using bedrails? 3  Help needed moving from lying on your back to sitting on the side of a flat bed without using bedrails? 3  Help needed moving to and from a bed to a chair (including a wheelchair)? 3  Help needed standing up from a chair using your arms (e.g., wheelchair or bedside chair)? 3  Help needed to walk in hospital room? 3  Help needed climbing 3-5 steps with a railing?  1  6 Click Score 16  Consider Recommendation of Discharge To: Home with [facility]  Progressive Mobility  What is the highest level of mobility based on the progressive mobility assessment? Level 4 (Walks with assist in room) - Balance while marching in place and cannot step forward and back - Complete  PT Goal Progression  Progress towards PT goals Progressing toward goals  Acute Rehab PT Goals  PT Goal Formulation With patient/family  Time For Goal Achievement 11/04/22  Potential to Achieve Goals Good  PT Time Calculation  PT Start Time (ACUTE ONLY) 1115  PT Stop Time (ACUTE ONLY) 1129  PT Time Calculation (min) (ACUTE ONLY) 14 min  PT General Charges  $$ ACUTE PT VISIT 1 Visit  PT Treatments  $Gait Training 8-22 mins

## 2022-11-11 ENCOUNTER — Other Ambulatory Visit: Payer: Self-pay

## 2022-11-11 DIAGNOSIS — Z95828 Presence of other vascular implants and grafts: Secondary | ICD-10-CM

## 2022-11-14 LAB — CULTURE, BLOOD (ROUTINE X 2)
Culture: NO GROWTH
Special Requests: ADEQUATE

## 2022-11-15 LAB — CULTURE, BLOOD (ROUTINE X 2)
Culture: NO GROWTH
Special Requests: ADEQUATE

## 2022-11-17 ENCOUNTER — Telehealth: Payer: Self-pay | Admitting: Pulmonary Disease

## 2022-11-17 DIAGNOSIS — R0609 Other forms of dyspnea: Secondary | ICD-10-CM

## 2022-11-17 NOTE — Telephone Encounter (Signed)
Order placed. Nothing further needed. 

## 2022-11-18 NOTE — Discharge Summary (Signed)
Patient ID: Norma Anthony MRN: [id number] DOB/AGE: 65-Jul-1957 65 y.o.  Admit date: 11/04/2022 Discharge date: 11/18/2022  Admission Diagnoses:  Principal Problem:   S/P TKR (total knee replacement) using cement Active Problems:   S/P TKR (total knee replacement) not using cement   Discharge Diagnoses:  Same  Past Medical History:  Diagnosis Date   Anxiety    CAD (coronary artery disease)    COPD (chronic obstructive pulmonary disease) (HCC)    Depression    Dyspnea    Fibromyalgia    GERD (gastroesophageal reflux disease)    Hiatal hernia    History of kidney stones    Migraine headache    Multinodular goiter    Nephrolithiasis    Pulmonary embolism ([place]) 1996   Sleep apnea    Thyroid nodule     Surgeries: Procedure(s): TOTAL KNEE ARTHROPLASTY on 11/04/2022   Consultants:   Discharged Condition: Improved  Hospital Course: Norma Anthony is an 66 y.o. female who was admitted 11/04/2022 for operative treatment ofS/P TKR (total knee replacement) using cement. Patient has severe unremitting pain that affects sleep, daily activities, and work/hobbies. After pre-op clearance the patient was taken to the operating room on 11/04/2022 and underwent postoperatively the patient developed confusion.  Initially thought to be secondary to opioids.  A subsequent UA indicated a urinary tract infection she was placed on Bactrim twice a day.  Patient improved with mental status.  Hospitalist was consulted.  They evaluated the patient when cultures were available and they recommended ampicillin.  She was discharged on ampicillin.  With normal bowel and bladder function.  And normal cognition.  In good range motion of her knee with mild to moderate swelling.  She was discharged in good condition to follow-up in 2 weeks with Dr. Tonita Cong.  Outpatient physical therapy Procedure(s): TOTAL KNEE ARTHROPLASTY.    Patient was given perioperative antibiotics:  Anti-infectives (From admission, onward)     Start     Dose/Rate Route Frequency Ordered Stop   11/10/22 1000  amoxicillin (AMOXIL) capsule 500 mg  Status:  Discontinued        500 mg Oral 3 times daily 11/10/22 0945 11/10/22 2227   11/10/22 0600  vancomycin (VANCOREADY) IVPB 1500 mg/300 mL  Status:  Discontinued       See Hyperspace for full Linked Orders Report.   1,500 mg 150 mL/hr over 120 Minutes Intravenous Every 24 hours 11/09/22 1635 11/10/22 0945   11/10/22 0000  amoxicillin (AMOXIL) 500 MG capsule        500 mg Oral 3 times daily 11/10/22 1252     11/09/22 1730  vancomycin (VANCOREADY) IVPB 1500 mg/300 mL       See Hyperspace for full Linked Orders Report.   1,500 mg 150 mL/hr over 120 Minutes Intravenous  Once 11/09/22 1635 11/09/22 2205   11/07/22 2200  sulfamethoxazole-trimethoprim (BACTRIM DS) 800-160 MG per tablet 1 tablet  Status:  Discontinued        1 tablet Oral Every 12 hours 11/07/22 1536 11/10/22 2227   11/04/22 0600  ceFAZolin (ANCEF) IVPB 2g/100 mL premix        2 g 200 mL/hr over 30 Minutes Intravenous On call to O.R. 11/04/22 2229 11/04/22 0751        Patient was given sequential compression devices, early ambulation, and chemoprophylaxis to prevent DVT.  Patient benefited maximally from hospital stay and there were no complications.    Recent vital signs: No data found.   Recent laboratory studies: No results  for input(s): "WBC", "HGB", "HCT", "PLT", "NA", "K", "CL", "CO2", "BUN", "CREATININE", "GLUCOSE", "INR", "CALCIUM" in the last 72 hours.  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 Pt stated she could not move her body but she could think    Belbuca [buprenorphine Hcl] Other (See Comments)   " Horrible headache"    Compazine [prochlorperazine] Anxiety, Other (See Comments)   Other reaction(s): anxiety   Droperidol Anxiety   Other reaction(s):  anxiety    Ergotamine-caffeine Anxiety   Other reaction(s): Increased systolic arterial pressure (finding)        Medication List     STOP taking these medications    aspirin EC 81 MG tablet   Goodys Back & Body Pain 500-325 MG Pack Generic drug: Aspirin-Acetaminophen   ibuprofen 200 MG tablet Commonly known as: ADVIL       TAKE these medications    albuterol 108 (90 Base) MCG/ACT inhaler Commonly known as: VENTOLIN HFA Inhale 2 puffs into the lungs every 6 (six) hours as needed for wheezing or shortness of breath.   ALPRAZolam 1 MG tablet Commonly known as: XANAX Take 1 mg by mouth 4 (four) times daily as needed for anxiety.   amoxicillin 500 MG capsule Commonly known as: AMOXIL Take 1 capsule (500 mg total) by mouth 3 (three) times daily.   b complex vitamins capsule Take 1 capsule by mouth daily.   Belbuca 450 MCG Film Generic drug: Buprenorphine HCl Place 450 mcg inside cheek in the morning and at bedtime.   benzonatate 100 MG capsule Commonly known as: TESSALON Take 1 capsule (100 mg total) by mouth every 8 (eight) hours.   clindamycin-benzoyl peroxide gel Commonly known as: BENZACLIN Apply 1 application  topically daily.   clotrimazole-betamethasone cream Commonly known as: LOTRISONE Apply 1 Application topically 2 (two) times daily as needed (irritation).   diphenhydrAMINE 25 mg capsule Commonly known as: BENADRYL Take 25 mg by mouth daily as needed for allergies.   docusate sodium 100 MG capsule Commonly known as: Colace Take 1 capsule (100 mg total) by mouth 2 (two) times daily as needed for mild constipation.   DULoxetine 60 MG capsule Commonly known as: CYMBALTA Take 60 mg by mouth at bedtime.   famotidine 20 MG tablet Commonly known as: Pepcid One after supper   Ferrous Fumarate-Folic Acid 794-8 MG Tabs Take 1 tablet by mouth daily. Hematinic   furosemide 20 MG tablet Commonly known as: LASIX Take 1 tablet (20 mg total) by mouth  daily. What changed: when to take this   gabapentin 300 MG capsule Commonly known as: NEURONTIN Take 300-600 mg by mouth See admin instructions. Take 300 mg morning and and evening, take 600 mg at bedtime   Meclizine HCl 25 MG Chew Chew 25 mg by mouth 3 (three) times daily as needed (dizziness).   melatonin 3 MG Tabs tablet Take 3 mg by mouth at bedtime.   MULTIVITAMIN GUMMIES ADULT PO Take 1 tablet by mouth daily. With Zinc and vitamin C   naloxone 4 MG/0.1ML Liqd nasal spray kit Commonly known as: NARCAN Place 1 spray into the nose once as needed (accidental over dose).   oxyCODONE 5 MG immediate release tablet Commonly known as: Oxy IR/ROXICODONE Take 1 tablet (5 mg total) by mouth every 4 (four) hours as needed for severe pain.  pantoprazole 40 MG tablet Commonly known as: PROTONIX Take 40 mg by mouth every morning.   polyethylene glycol 17 g packet Commonly known as: MIRALAX / GLYCOLAX Take 17 g by mouth daily.   promethazine 25 MG tablet Commonly known as: PHENERGAN Take 25 mg by mouth every 6 (six) hours as needed for nausea or vomiting.   salicylic acid-lactic acid 17 % external solution Apply 1 Application topically daily. Corn on foot   Vitamin D3 1.25 MG (50000 UT) Caps Take 50,000 Units by mouth every Wednesday.   Xarelto 20 MG Tabs tablet Generic drug: rivaroxaban Take 20 mg by mouth daily.        Diagnostic Studies: DG Knee 1-2 Views Left  Result Date: 11/04/2022 CLINICAL DATA:  Status post total left knee arthroplasty. EXAM: LEFT KNEE - 1-2 VIEW COMPARISON:  Left tibia and fibula radiographs 08/31/2022 FINDINGS: Interval total left knee arthroplasty. No perihardware lucency is seen to indicate hardware failure or loosening. Expected postoperative changes are seen including mild intra-articular air and fluid and mild anterior knee subcutaneous air. No acute fracture or dislocation. IMPRESSION: Interval total left knee arthroplasty without evidence  of hardware failure. Electronically Signed   By: Yvonne Kendall M.D.   On: 11/04/2022 10:51    Disposition: Discharge disposition: 01-Home or Self Care       Discharge Instructions     Call MD / Call 911   Complete by: As directed    If you experience chest pain or shortness of breath, CALL 911 and be transported to the hospital emergency room.  If you develope a fever above 101 F, pus (white drainage) or increased drainage or redness at the wound, or calf pain, call your surgeon's office.   Call MD / Call 911   Complete by: As directed    If you experience chest pain or shortness of breath, CALL 911 and be transported to the hospital emergency room.  If you develope a fever above 101 F, pus (white drainage) or increased drainage or redness at the wound, or calf pain, call your surgeon's office.   Constipation Prevention   Complete by: As directed    Drink plenty of fluids.  Prune juice may be helpful.  You may use a stool softener, such as Colace (over the counter) 100 mg twice a day.  Use MiraLax (over the counter) for constipation as needed.   Constipation Prevention   Complete by: As directed    Drink plenty of fluids.  Prune juice may be helpful.  You may use a stool softener, such as Colace (over the counter) 100 mg twice a day.  Use MiraLax (over the counter) for constipation as needed.   Diet - low sodium heart healthy   Complete by: As directed    Diet - low sodium heart healthy   Complete by: As directed    Increase activity slowly as tolerated   Complete by: As directed    Increase activity slowly as tolerated   Complete by: As directed    Post-operative opioid taper instructions:   Complete by: As directed    POST-OPERATIVE OPIOID TAPER INSTRUCTIONS: It is important to wean off of your opioid medication as soon as possible. If you do not need pain medication after your surgery it is ok to stop day one. Opioids include: Codeine, Hydrocodone(Norco, Vicodin),  Oxycodone(Percocet, oxycontin) and hydromorphone amongst others.  Long term and even short term use of opiods can cause: Increased pain response Dependence Constipation Depression Respiratory depression  And more.  Withdrawal symptoms can include Flu like symptoms Nausea, vomiting And more Techniques to manage these symptoms Hydrate well Eat regular healthy meals Stay active Use relaxation techniques(deep breathing, meditating, yoga) Do Not substitute Alcohol to help with tapering If you have been on opioids for less than two weeks and do not have pain than it is ok to stop all together.  Plan to wean off of opioids This plan should start within one week post op of your joint replacement. Maintain the same interval or time between taking each dose and first decrease the dose.  Cut the total daily intake of opioids by one tablet each day Next start to increase the time between doses. The last dose that should be eliminated is the evening dose.      Post-operative opioid taper instructions:   Complete by: As directed    POST-OPERATIVE OPIOID TAPER INSTRUCTIONS: It is important to wean off of your opioid medication as soon as possible. If you do not need pain medication after your surgery it is ok to stop day one. Opioids include: Codeine, Hydrocodone(Norco, Vicodin), Oxycodone(Percocet, oxycontin) and hydromorphone amongst others.  Long term and even short term use of opiods can cause: Increased pain response Dependence Constipation Depression Respiratory depression And more.  Withdrawal symptoms can include Flu like symptoms Nausea, vomiting And more Techniques to manage these symptoms Hydrate well Eat regular healthy meals Stay active Use relaxation techniques(deep breathing, meditating, yoga) Do Not substitute Alcohol to help with tapering If you have been on opioids for less than two weeks and do not have pain than it is ok to stop all together.  Plan to wean off  of opioids This plan should start within one week post op of your joint replacement. Maintain the same interval or time between taking each dose and first decrease the dose.  Cut the total daily intake of opioids by one tablet each day Next start to increase the time between doses. The last dose that should be eliminated is the evening dose.           Follow-up Information     Susa Day, MD Follow up in 2 week(s).   Specialty: Orthopedic Surgery Contact information: [address] [place] [address] [phone]         [facility] Follow up.   Specialty: [place] Why: to provide home physical therapy visits Contact information: [place] [address] [phone]                  Signed: Johnn Hai 11/18/2022, 8:37 AM  Patient ID: Norma Anthony MRN: [id number] DOB/AGE: [birth year] 66 y.o.  Admit date: 11/04/2022 Discharge date: 11/18/2022  Admission Diagnoses:  Principal Problem:   S/P TKR (total knee replacement) using cement Active Problems:   S/P TKR (total knee replacement) not using cement   Discharge Diagnoses:  Same  Past Medical History:  Diagnosis Date   Anxiety    CAD (coronary artery disease)    COPD (chronic obstructive pulmonary disease) (HCC)    Depression    Dyspnea    Fibromyalgia    GERD (gastroesophageal reflux disease)    Hiatal hernia    History of kidney stones    Migraine headache    Multinodular goiter    Nephrolithiasis    Pulmonary embolism ([employer]) 1996   Sleep apnea    Thyroid nodule     Surgeries: Procedure(s): TOTAL KNEE ARTHROPLASTY on 11/04/2022  Consultants:   Discharged Condition: Improved  Hospital Course: Norma Anthony is an 66 y.o. female who was admitted 11/04/2022 for operative treatment ofS/P TKR (total knee replacement) using cement. Patient has severe unremitting pain that affects sleep, daily activities, and work/hobbies.  After pre-op clearance the patient was taken to the operating room on 11/04/2022 and underwent  Procedure(s): TOTAL KNEE ARTHROPLASTY.    Patient was given perioperative antibiotics:  Anti-infectives (From admission, onward)    Start     Dose/Rate Route Frequency Ordered Stop   11/10/22 1000  amoxicillin (AMOXIL) capsule 500 mg  Status:  Discontinued        500 mg Oral 3 times daily 11/10/22 0945 11/10/22 2227   11/10/22 0600  vancomycin (VANCOREADY) IVPB 1500 mg/300 mL  Status:  Discontinued       See Hyperspace for full Linked Orders Report.   1,500 mg 150 mL/hr over 120 Minutes Intravenous Every 24 hours 11/09/22 1635 11/10/22 0945   11/10/22 0000  amoxicillin (AMOXIL) 500 MG capsule        500 mg Oral 3 times daily 11/10/22 1252     11/09/22 1730  vancomycin (VANCOREADY) IVPB 1500 mg/300 mL       See Hyperspace for full Linked Orders Report.   1,500 mg 150 mL/hr over 120 Minutes Intravenous  Once 11/09/22 1635 11/09/22 2205   11/07/22 2200  sulfamethoxazole-trimethoprim (BACTRIM DS) 800-160 MG per tablet 1 tablet  Status:  Discontinued        1 tablet Oral Every 12 hours 11/07/22 1536 11/10/22 2227   11/04/22 0600  ceFAZolin (ANCEF) IVPB 2g/100 mL premix        2 g 200 mL/hr over 30 Minutes Intravenous On call to O.R. 11/04/22 5462 11/04/22 0751        Patient was given sequential compression devices, early ambulation, and chemoprophylaxis to prevent DVT.  Patient benefited maximally from hospital stay and there were no complications.    Recent vital signs: No data found.   Recent laboratory studies: No results for input(s): "WBC", "HGB", "HCT", "PLT", "NA", "K", "CL", "CO2", "BUN", "CREATININE", "GLUCOSE", "INR", "CALCIUM" in the last 72 hours.  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 Pt stated she could not move her body  but she could think    Belbuca [buprenorphine Hcl] Other (See Comments)   " Horrible headache"    Compazine [prochlorperazine] Anxiety, Other (See Comments)   Other reaction(s): anxiety   Droperidol Anxiety   Other reaction(s): anxiety    Ergotamine-caffeine Anxiety   Other reaction(s): Increased systolic arterial pressure (finding)        Medication List     STOP taking these medications    aspirin EC 81 MG tablet   Goodys Back & Body Pain 500-325 MG Pack Generic drug: Aspirin-Acetaminophen   ibuprofen 200 MG tablet Commonly known as: ADVIL       TAKE these medications    albuterol 108 (90 Base) MCG/ACT inhaler Commonly known as: VENTOLIN HFA Inhale 2 puffs into the lungs every 6 (six) hours as needed for wheezing or shortness of breath.   ALPRAZolam 1 MG tablet Commonly known as: XANAX Take 1 mg by mouth 4 (four) times daily as needed for anxiety.   amoxicillin 500 MG capsule Commonly known as: AMOXIL Take 1 capsule (500 mg total) by  mouth 3 (three) times daily.   b complex vitamins capsule Take 1 capsule by mouth daily.   Belbuca 450 MCG Film Generic drug: Buprenorphine HCl Place 450 mcg inside cheek in the morning and at bedtime.   benzonatate 100 MG capsule Commonly known as: TESSALON Take 1 capsule (100 mg total) by mouth every 8 (eight) hours.   clindamycin-benzoyl peroxide gel Commonly known as: BENZACLIN Apply 1 application  topically daily.   clotrimazole-betamethasone cream Commonly known as: LOTRISONE Apply 1 Application topically 2 (two) times daily as needed (irritation).   diphenhydrAMINE 25 mg capsule Commonly known as: BENADRYL Take 25 mg by mouth daily as needed for allergies.   docusate sodium 100 MG capsule Commonly known as: Colace Take 1 capsule (100 mg total) by mouth 2 (two) times daily as needed for mild constipation.   DULoxetine 60 MG capsule Commonly known as: CYMBALTA Take 60 mg by mouth at bedtime.   famotidine 20  MG tablet Commonly known as: Pepcid One after supper   Ferrous Fumarate-Folic Acid 937-3 MG Tabs Take 1 tablet by mouth daily. Hematinic   furosemide 20 MG tablet Commonly known as: LASIX Take 1 tablet (20 mg total) by mouth daily. What changed: when to take this   gabapentin 300 MG capsule Commonly known as: NEURONTIN Take 300-600 mg by mouth See admin instructions. Take 300 mg morning and and evening, take 600 mg at bedtime   Meclizine HCl 25 MG Chew Chew 25 mg by mouth 3 (three) times daily as needed (dizziness).   melatonin 3 MG Tabs tablet Take 3 mg by mouth at bedtime.   MULTIVITAMIN GUMMIES ADULT PO Take 1 tablet by mouth daily. With Zinc and vitamin C   naloxone 4 MG/0.1ML Liqd nasal spray kit Commonly known as: NARCAN Place 1 spray into the nose once as needed (accidental over dose).   oxyCODONE 5 MG immediate release tablet Commonly known as: Oxy IR/ROXICODONE Take 1 tablet (5 mg total) by mouth every 4 (four) hours as needed for severe pain.   pantoprazole 40 MG tablet Commonly known as: PROTONIX Take 40 mg by mouth every morning.   polyethylene glycol 17 g packet Commonly known as: MIRALAX / GLYCOLAX Take 17 g by mouth daily.   promethazine 25 MG tablet Commonly known as: PHENERGAN Take 25 mg by mouth every 6 (six) hours as needed for nausea or vomiting.   salicylic acid-lactic acid 17 % external solution Apply 1 Application topically daily. Corn on foot   Vitamin D3 1.25 MG (50000 UT) Caps Take 50,000 Units by mouth every Wednesday.   Xarelto 20 MG Tabs tablet Generic drug: rivaroxaban Take 20 mg by mouth daily.        Diagnostic Studies: DG Knee 1-2 Views Left  Result Date: 11/04/2022 CLINICAL DATA:  Status post total left knee arthroplasty. EXAM: LEFT KNEE - 1-2 VIEW COMPARISON:  Left tibia and fibula radiographs 08/31/2022 FINDINGS: Interval total left knee arthroplasty. No perihardware lucency is seen to indicate hardware failure or  loosening. Expected postoperative changes are seen including mild intra-articular air and fluid and mild anterior knee subcutaneous air. No acute fracture or dislocation. IMPRESSION: Interval total left knee arthroplasty without evidence of hardware failure. Electronically Signed   By: Yvonne Kendall M.D.   On: 11/04/2022 10:51    Disposition: Discharge disposition: 01-Home or Self Care       Discharge Instructions     Call MD / Call 911   Complete by: As directed  If you experience chest pain or shortness of breath, CALL 911 and be transported to the hospital emergency room.  If you develope a fever above 101 F, pus (white drainage) or increased drainage or redness at the wound, or calf pain, call your surgeon's office.   Call MD / Call 911   Complete by: As directed    If you experience chest pain or shortness of breath, CALL 911 and be transported to the hospital emergency room.  If you develope a fever above 101 F, pus (white drainage) or increased drainage or redness at the wound, or calf pain, call your surgeon's office.   Constipation Prevention   Complete by: As directed    Drink plenty of fluids.  Prune juice may be helpful.  You may use a stool softener, such as Colace (over the counter) 100 mg twice a day.  Use MiraLax (over the counter) for constipation as needed.   Constipation Prevention   Complete by: As directed    Drink plenty of fluids.  Prune juice may be helpful.  You may use a stool softener, such as Colace (over the counter) 100 mg twice a day.  Use MiraLax (over the counter) for constipation as needed.   Diet - low sodium heart healthy   Complete by: As directed    Diet - low sodium heart healthy   Complete by: As directed    Increase activity slowly as tolerated   Complete by: As directed    Increase activity slowly as tolerated   Complete by: As directed    Post-operative opioid taper instructions:   Complete by: As directed    POST-OPERATIVE OPIOID TAPER  INSTRUCTIONS: It is important to wean off of your opioid medication as soon as possible. If you do not need pain medication after your surgery it is ok to stop day one. Opioids include: Codeine, Hydrocodone(Norco, Vicodin), Oxycodone(Percocet, oxycontin) and hydromorphone amongst others.  Long term and even short term use of opiods can cause: Increased pain response Dependence Constipation Depression Respiratory depression And more.  Withdrawal symptoms can include Flu like symptoms Nausea, vomiting And more Techniques to manage these symptoms Hydrate well Eat regular healthy meals Stay active Use relaxation techniques(deep breathing, meditating, yoga) Do Not substitute Alcohol to help with tapering If you have been on opioids for less than two weeks and do not have pain than it is ok to stop all together.  Plan to wean off of opioids This plan should start within one week post op of your joint replacement. Maintain the same interval or time between taking each dose and first decrease the dose.  Cut the total daily intake of opioids by one tablet each day Next start to increase the time between doses. The last dose that should be eliminated is the evening dose.      Post-operative opioid taper instructions:   Complete by: As directed    POST-OPERATIVE OPIOID TAPER INSTRUCTIONS: It is important to wean off of your opioid medication as soon as possible. If you do not need pain medication after your surgery it is ok to stop day one. Opioids include: Codeine, Hydrocodone(Norco, Vicodin), Oxycodone(Percocet, oxycontin) and hydromorphone amongst others.  Long term and even short term use of opiods can cause: Increased pain response Dependence Constipation Depression Respiratory depression And more.  Withdrawal symptoms can include Flu like symptoms Nausea, vomiting And more Techniques to manage these symptoms Hydrate well Eat regular healthy meals Stay active Use  relaxation techniques(deep breathing, meditating, yoga)  Do Not substitute Alcohol to help with tapering If you have been on opioids for less than two weeks and do not have pain than it is ok to stop all together.  Plan to wean off of opioids This plan should start within one week post op of your joint replacement. Maintain the same interval or time between taking each dose and first decrease the dose.  Cut the total daily intake of opioids by one tablet each day Next start to increase the time between doses. The last dose that should be eliminated is the evening dose.           Follow-up Information     Susa Day, MD Follow up in 2 week(s).   Specialty: Orthopedic Surgery Contact information: [address] [place] [address] [phone]         [facility] Follow up.   Specialty: [place] Why: to provide home physical therapy visits Contact information: [place] [address] [phone]                  Signed: Johnn Hai 11/18/2022, 8:38 AM  Patient ID: Norma Anthony MRN: [id number] DOB/AGE: 19-Nov-1956 66 y.o.  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 female who was admitted 11/04/2022 for operative treatment ofS/P TKR (total knee replacement) using cement. Patient has severe unremitting pain that affects sleep, daily activities, and work/hobbies. After pre-op clearance the patient was taken to the operating room on 11/04/2022 and underwent  Procedure(s): TOTAL KNEE ARTHROPLASTY.    Patient was given perioperative antibiotics:  Anti-infectives (From admission, onward)    Start     Dose/Rate Route Frequency Ordered Stop   11/10/22 1000  amoxicillin (AMOXIL) capsule 500 mg  Status:  Discontinued        500 mg Oral 3 times daily 11/10/22 0945 11/10/22 2227   11/10/22 0600  vancomycin (VANCOREADY) IVPB 1500 mg/300 mL  Status:  Discontinued       See Hyperspace for full Linked Orders Report.   1,500 mg 150 mL/hr over 120 Minutes Intravenous Every 24 hours 11/09/22 1635 11/10/22 0945   11/10/22 0000  amoxicillin (AMOXIL) 500 MG capsule        500 mg Oral 3 times daily 11/10/22 1252     11/09/22 1730  vancomycin (VANCOREADY) IVPB 1500 mg/300 mL       See Hyperspace for full Linked Orders Report.   1,500 mg 150 mL/hr over 120 Minutes Intravenous  Once 11/09/22 1635 11/09/22 2205   11/07/22 2200  sulfamethoxazole-trimethoprim (BACTRIM DS) 800-160 MG per tablet 1 tablet  Status:  Discontinued        1 tablet Oral Every 12 hours 11/07/22 1536 11/10/22 2227   11/04/22 0600  ceFAZolin (ANCEF) IVPB 2g/100 mL premix        2 g 200 mL/hr over 30 Minutes Intravenous On call to O.R. 11/04/22 7062 11/04/22 0751        Patient was given sequential compression devices, early ambulation, and chemoprophylaxis to prevent DVT.  Patient benefited maximally from hospital stay and there were no complications.    Recent vital signs: No data found.   Recent laboratory studies: No results for input(s): "WBC", "HGB", "HCT", "PLT", "NA", "K", "CL", "CO2", "BUN", "CREATININE", "GLUCOSE", "INR", "CALCIUM" in the last 72  hours.  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 Pt stated she could not move her body but she could think    Belbuca [buprenorphine Hcl] Other (See Comments)   " Horrible headache"    Compazine [prochlorperazine] Anxiety, Other (See Comments)   Other reaction(s): anxiety   Droperidol Anxiety   Other reaction(s): anxiety    Ergotamine-caffeine Anxiety   Other reaction(s): Increased systolic arterial pressure (finding)        Medication List     STOP taking these medications    aspirin EC 81 MG tablet   Goodys Back & Body Pain 500-325 MG Pack Generic drug: Aspirin-Acetaminophen   ibuprofen 200 MG tablet Commonly known as: ADVIL       TAKE these medications    albuterol 108 (90 Base) MCG/ACT inhaler Commonly known as: VENTOLIN HFA Inhale 2 puffs into the lungs every 6 (six) hours as needed for wheezing or shortness of breath.   ALPRAZolam 1 MG tablet Commonly known as: XANAX Take 1 mg by mouth 4 (four) times daily as needed for anxiety.   amoxicillin 500 MG capsule Commonly known as: AMOXIL Take 1 capsule (500 mg total) by mouth 3 (three) times daily.   b complex vitamins capsule Take 1 capsule by mouth daily.   Belbuca 450 MCG Film Generic drug: Buprenorphine HCl Place 450 mcg inside cheek in the morning and at bedtime.   benzonatate 100 MG capsule Commonly known as: TESSALON Take 1 capsule (100 mg total) by mouth every 8 (eight) hours.   clindamycin-benzoyl peroxide gel Commonly known as: BENZACLIN Apply 1 application  topically daily.   clotrimazole-betamethasone cream Commonly known as: LOTRISONE Apply 1 Application topically 2 (two) times daily as needed (irritation).   diphenhydrAMINE 25 mg capsule Commonly known as: BENADRYL Take 25 mg by mouth daily as needed for allergies.   docusate  sodium 100 MG capsule Commonly known as: Colace Take 1 capsule (100 mg total) by mouth 2 (two) times daily as needed for mild constipation.   DULoxetine 60 MG capsule Commonly known as: CYMBALTA Take 60 mg by mouth at bedtime.   famotidine 20 MG tablet Commonly known as: Pepcid One after supper   Ferrous Fumarate-Folic Acid 376-2 MG Tabs Take 1 tablet by mouth daily. Hematinic   furosemide 20 MG tablet Commonly known as: LASIX Take 1 tablet (20 mg total) by mouth daily. What changed: when to take this   gabapentin 300 MG capsule Commonly known as: NEURONTIN Take 300-600 mg by mouth See admin instructions. Take 300 mg morning and and evening, take 600 mg at bedtime   Meclizine HCl 25 MG  Chew Chew 25 mg by mouth 3 (three) times daily as needed (dizziness).   melatonin 3 MG Tabs tablet Take 3 mg by mouth at bedtime.   MULTIVITAMIN GUMMIES ADULT PO Take 1 tablet by mouth daily. With Zinc and vitamin C   naloxone 4 MG/0.1ML Liqd nasal spray kit Commonly known as: NARCAN Place 1 spray into the nose once as needed (accidental over dose).   oxyCODONE 5 MG immediate release tablet Commonly known as: Oxy IR/ROXICODONE Take 1 tablet (5 mg total) by mouth every 4 (four) hours as needed for severe pain.   pantoprazole 40 MG tablet Commonly known as: PROTONIX Take 40 mg by mouth every morning.   polyethylene glycol 17 g packet Commonly known as: MIRALAX / GLYCOLAX Take 17 g by mouth daily.   promethazine 25 MG tablet Commonly known as: PHENERGAN Take 25 mg by mouth every 6 (six) hours as needed for nausea or vomiting.   salicylic acid-lactic acid 17 % external solution Apply 1 Application topically daily. Corn on foot   Vitamin D3 1.25 MG (50000 UT) Caps Take 50,000 Units by mouth every Wednesday.   Xarelto 20 MG Tabs tablet Generic drug: rivaroxaban Take 20 mg by mouth daily.        Diagnostic Studies: DG Knee 1-2 Views Left  Result Date: 11/04/2022 CLINICAL  DATA:  Status post total left knee arthroplasty. EXAM: LEFT KNEE - 1-2 VIEW COMPARISON:  Left tibia and fibula radiographs 08/31/2022 FINDINGS: Interval total left knee arthroplasty. No perihardware lucency is seen to indicate hardware failure or loosening. Expected postoperative changes are seen including mild intra-articular air and fluid and mild anterior knee subcutaneous air. No acute fracture or dislocation. IMPRESSION: Interval total left knee arthroplasty without evidence of hardware failure. Electronically Signed   By: Yvonne Kendall M.D.   On: 11/04/2022 10:51    Disposition: Discharge disposition: 01-Home or Self Care       Discharge Instructions     Call MD / Call 911   Complete by: As directed    If you experience chest pain or shortness of breath, CALL 911 and be transported to the hospital emergency room.  If you develope a fever above 101 F, pus (white drainage) or increased drainage or redness at the wound, or calf pain, call your surgeon's office.   Call MD / Call 911   Complete by: As directed    If you experience chest pain or shortness of breath, CALL 911 and be transported to the hospital emergency room.  If you develope a fever above 101 F, pus (white drainage) or increased drainage or redness at the wound, or calf pain, call your surgeon's office.   Constipation Prevention   Complete by: As directed    Drink plenty of fluids.  Prune juice may be helpful.  You may use a stool softener, such as Colace (over the counter) 100 mg twice a day.  Use MiraLax (over the counter) for constipation as needed.   Constipation Prevention   Complete by: As directed    Drink plenty of fluids.  Prune juice may be helpful.  You may use a stool softener, such as Colace (over the counter) 100 mg twice a day.  Use MiraLax (over the counter) for constipation as needed.   Diet - low sodium heart healthy   Complete by: As directed    Diet - low sodium heart healthy   Complete by: As directed     Increase activity slowly as tolerated  Complete by: As directed    Increase activity slowly as tolerated   Complete by: As directed    Post-operative opioid taper instructions:   Complete by: As directed    POST-OPERATIVE OPIOID TAPER INSTRUCTIONS: It is important to wean off of your opioid medication as soon as possible. If you do not need pain medication after your surgery it is ok to stop day one. Opioids include: Codeine, Hydrocodone(Norco, Vicodin), Oxycodone(Percocet, oxycontin) and hydromorphone amongst others.  Long term and even short term use of opiods can cause: Increased pain response Dependence Constipation Depression Respiratory depression And more.  Withdrawal symptoms can include Flu like symptoms Nausea, vomiting And more Techniques to manage these symptoms Hydrate well Eat regular healthy meals Stay active Use relaxation techniques(deep breathing, meditating, yoga) Do Not substitute Alcohol to help with tapering If you have been on opioids for less than two weeks and do not have pain than it is ok to stop all together.  Plan to wean off of opioids This plan should start within one week post op of your joint replacement. Maintain the same interval or time between taking each dose and first decrease the dose.  Cut the total daily intake of opioids by one tablet each day Next start to increase the time between doses. The last dose that should be eliminated is the evening dose.      Post-operative opioid taper instructions:   Complete by: As directed    POST-OPERATIVE OPIOID TAPER INSTRUCTIONS: It is important to wean off of your opioid medication as soon as possible. If you do not need pain medication after your surgery it is ok to stop day one. Opioids include: Codeine, Hydrocodone(Norco, Vicodin), Oxycodone(Percocet, oxycontin) and hydromorphone amongst others.  Long term and even short term use of opiods can cause: Increased pain  response Dependence Constipation Depression Respiratory depression And more.  Withdrawal symptoms can include Flu like symptoms Nausea, vomiting And more Techniques to manage these symptoms Hydrate well Eat regular healthy meals Stay active Use relaxation techniques(deep breathing, meditating, yoga) Do Not substitute Alcohol to help with tapering If you have been on opioids for less than two weeks and do not have pain than it is ok to stop all together.  Plan to wean off of opioids This plan should start within one week post op of your joint replacement. Maintain the same interval or time between taking each dose and first decrease the dose.  Cut the total daily intake of opioids by one tablet each day Next start to increase the time between doses. The last dose that should be eliminated is the evening dose.           Follow-up Information     Susa Day, MD Follow up in 2 week(s).   Specialty: Orthopedic Surgery Contact information: [address] [place] [address] [phone]         [facility] [place] Follow up.   Specialty: [place] Why: to provide home physical therapy visits Contact information: [place] [address] [phone]                  Signed: Johnn Hai 11/18/2022, 8:38 AM  Patient ID: Norma Anthony MRN: [id number] DOB/AGE: [birth year] 66 y.o.  Admit date: 11/04/2022 Discharge date: 11/18/2022  Admission Diagnoses:  Principal Problem:   S/P TKR (total knee replacement) using cement Active Problems:   S/P TKR (total knee replacement) not using cement   Discharge Diagnoses:  Same  Past Medical History:  Diagnosis Date   Anxiety    CAD (coronary artery disease)    COPD (chronic obstructive pulmonary disease) (HCC)    Depression    Dyspnea    Fibromyalgia    GERD (gastroesophageal reflux disease)    Hiatal hernia    History of kidney stones    Migraine  headache    Multinodular goiter    Nephrolithiasis    Pulmonary embolism ([place]) 1996   Sleep apnea    Thyroid nodule     Surgeries: Procedure(s): TOTAL KNEE ARTHROPLASTY on 11/04/2022   Consultants:   Discharged Condition: Improved  Hospital Course: Norma Anthony is an 66 y.o. female who was admitted 11/04/2022 for operative treatment ofS/P TKR (total knee replacement) using cement. Patient has severe unremitting pain that affects sleep, daily activities, and work/hobbies. After pre-op clearance the patient was taken to the operating room on 11/04/2022 and underwent  Procedure(s): TOTAL KNEE ARTHROPLASTY.    Patient was given perioperative antibiotics:  Anti-infectives (From admission, onward)    Start     Dose/Rate Route Frequency Ordered Stop   11/10/22 1000  amoxicillin (AMOXIL) capsule 500 mg  Status:  Discontinued        500 mg Oral 3 times daily 11/10/22 0945 11/10/22 2227   11/10/22 0600  vancomycin (VANCOREADY) IVPB 1500 mg/300 mL  Status:  Discontinued       See Hyperspace for full Linked Orders Report.   1,500 mg 150 mL/hr over 120 Minutes Intravenous Every 24 hours 11/09/22 1635 11/10/22 0945   11/10/22 0000  amoxicillin (AMOXIL) 500 MG capsule        500 mg Oral 3 times daily 11/10/22 1252     11/09/22 1730  vancomycin (VANCOREADY) IVPB 1500 mg/300 mL       See Hyperspace for full Linked Orders Report.   1,500 mg 150 mL/hr over 120 Minutes Intravenous  Once 11/09/22 1635 11/09/22 2205   11/07/22 2200  sulfamethoxazole-trimethoprim (BACTRIM DS) 800-160 MG per tablet 1 tablet  Status:  Discontinued        1 tablet Oral Every 12 hours 11/07/22 1536 11/10/22 2227   11/04/22 0600  ceFAZolin (ANCEF) IVPB 2g/100 mL premix        2 g 200 mL/hr over 30 Minutes Intravenous On call to O.R. 11/04/22 8250 11/04/22 0751        Patient was given sequential compression devices, early ambulation, and chemoprophylaxis to prevent DVT.  Patient benefited maximally from hospital stay  and there were no complications.    Recent vital signs: No data found.   Recent laboratory studies: No results for input(s): "WBC", "HGB", "HCT", "PLT", "NA", "K", "CL", "CO2", "BUN", "CREATININE", "GLUCOSE", "INR", "CALCIUM" in the last 72 hours.  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 Pt stated she could not move her body but she could think    Belbuca [buprenorphine Hcl] Other (See Comments)   " Horrible headache"    Compazine [prochlorperazine] Anxiety, Other (See Comments)   Other reaction(s): anxiety   Droperidol Anxiety   Other reaction(s): anxiety    Ergotamine-caffeine Anxiety   Other reaction(s): Increased systolic arterial pressure (finding)        Medication List     STOP taking these medications    aspirin EC 81 MG tablet   [facility]  Back & Body Pain 500-325 MG Pack Generic drug: Aspirin-Acetaminophen   ibuprofen 200 MG tablet Commonly known as: ADVIL       TAKE these medications    albuterol 108 (90 Base) MCG/ACT inhaler Commonly known as: VENTOLIN HFA Inhale 2 puffs into the lungs every 6 (six) hours as needed for wheezing or shortness of breath.   ALPRAZolam 1 MG tablet Commonly known as: XANAX Take 1 mg by mouth 4 (four) times daily as needed for anxiety.   amoxicillin 500 MG capsule Commonly known as: AMOXIL Take 1 capsule (500 mg total) by mouth 3 (three) times daily.   b complex vitamins capsule Take 1 capsule by mouth daily.   Belbuca 450 MCG Film Generic drug: Buprenorphine HCl Place 450 mcg inside cheek in the morning and at bedtime.   benzonatate 100 MG capsule Commonly known as: TESSALON Take 1 capsule (100 mg total) by mouth every 8 (eight) hours.   clindamycin-benzoyl peroxide gel Commonly known as: BENZACLIN Apply 1 application  topically daily.   clotrimazole-betamethasone  cream Commonly known as: LOTRISONE Apply 1 Application topically 2 (two) times daily as needed (irritation).   diphenhydrAMINE 25 mg capsule Commonly known as: BENADRYL Take 25 mg by mouth daily as needed for allergies.   docusate sodium 100 MG capsule Commonly known as: Colace Take 1 capsule (100 mg total) by mouth 2 (two) times daily as needed for mild constipation.   DULoxetine 60 MG capsule Commonly known as: CYMBALTA Take 60 mg by mouth at bedtime.   famotidine 20 MG tablet Commonly known as: Pepcid One after supper   Ferrous Fumarate-Folic Acid 545-6 MG Tabs Take 1 tablet by mouth daily. Hematinic   furosemide 20 MG tablet Commonly known as: LASIX Take 1 tablet (20 mg total) by mouth daily. What changed: when to take this   gabapentin 300 MG capsule Commonly known as: NEURONTIN Take 300-600 mg by mouth See admin instructions. Take 300 mg morning and and evening, take 600 mg at bedtime   Meclizine HCl 25 MG Chew Chew 25 mg by mouth 3 (three) times daily as needed (dizziness).   melatonin 3 MG Tabs tablet Take 3 mg by mouth at bedtime.   MULTIVITAMIN GUMMIES ADULT PO Take 1 tablet by mouth daily. With Zinc and vitamin C   naloxone 4 MG/0.1ML Liqd nasal spray kit Commonly known as: NARCAN Place 1 spray into the nose once as needed (accidental over dose).   oxyCODONE 5 MG immediate release tablet Commonly known as: Oxy IR/ROXICODONE Take 1 tablet (5 mg total) by mouth every 4 (four) hours as needed for severe pain.   pantoprazole 40 MG tablet Commonly known as: PROTONIX Take 40 mg by mouth every morning.   polyethylene glycol 17 g packet Commonly known as: MIRALAX / GLYCOLAX Take 17 g by mouth daily.   promethazine 25 MG tablet Commonly known as: PHENERGAN Take 25 mg by mouth every 6 (six) hours as needed for nausea or vomiting.   salicylic acid-lactic acid 17 % external solution Apply 1 Application topically daily. Corn on foot   Vitamin D3 1.25 MG  (50000 UT) Caps Take 50,000 Units by mouth every Wednesday.   Xarelto 20 MG Tabs tablet Generic drug: rivaroxaban Take 20 mg by mouth daily.        Diagnostic Studies: DG Knee 1-2 Views Left  Result Date: 11/04/2022 CLINICAL DATA:  Status post total left knee arthroplasty. EXAM: LEFT KNEE - 1-2 VIEW COMPARISON:  Left tibia and fibula radiographs  08/31/2022 FINDINGS: Interval total left knee arthroplasty. No perihardware lucency is seen to indicate hardware failure or loosening. Expected postoperative changes are seen including mild intra-articular air and fluid and mild anterior knee subcutaneous air. No acute fracture or dislocation. IMPRESSION: Interval total left knee arthroplasty without evidence of hardware failure. Electronically Signed   By: Yvonne Kendall M.D.   On: 11/04/2022 10:51    Disposition: Discharge disposition: 01-Home or Self Care       Discharge Instructions     Call MD / Call 911   Complete by: As directed    If you experience chest pain or shortness of breath, CALL 911 and be transported to the hospital emergency room.  If you develope a fever above 101 F, pus (white drainage) or increased drainage or redness at the wound, or calf pain, call your surgeon's office.   Call MD / Call 911   Complete by: As directed    If you experience chest pain or shortness of breath, CALL 911 and be transported to the hospital emergency room.  If you develope a fever above 101 F, pus (white drainage) or increased drainage or redness at the wound, or calf pain, call your surgeon's office.   Constipation Prevention   Complete by: As directed    Drink plenty of fluids.  Prune juice may be helpful.  You may use a stool softener, such as Colace (over the counter) 100 mg twice a day.  Use MiraLax (over the counter) for constipation as needed.   Constipation Prevention   Complete by: As directed    Drink plenty of fluids.  Prune juice may be helpful.  You may use a stool softener,  such as Colace (over the counter) 100 mg twice a day.  Use MiraLax (over the counter) for constipation as needed.   Diet - low sodium heart healthy   Complete by: As directed    Diet - low sodium heart healthy   Complete by: As directed    Increase activity slowly as tolerated   Complete by: As directed    Increase activity slowly as tolerated   Complete by: As directed    Post-operative opioid taper instructions:   Complete by: As directed    POST-OPERATIVE OPIOID TAPER INSTRUCTIONS: It is important to wean off of your opioid medication as soon as possible. If you do not need pain medication after your surgery it is ok to stop day one. Opioids include: Codeine, Hydrocodone(Norco, Vicodin), Oxycodone(Percocet, oxycontin) and hydromorphone amongst others.  Long term and even short term use of opiods can cause: Increased pain response Dependence Constipation Depression Respiratory depression And more.  Withdrawal symptoms can include Flu like symptoms Nausea, vomiting And more Techniques to manage these symptoms Hydrate well Eat regular healthy meals Stay active Use relaxation techniques(deep breathing, meditating, yoga) Do Not substitute Alcohol to help with tapering If you have been on opioids for less than two weeks and do not have pain than it is ok to stop all together.  Plan to wean off of opioids This plan should start within one week post op of your joint replacement. Maintain the same interval or time between taking each dose and first decrease the dose.  Cut the total daily intake of opioids by one tablet each day Next start to increase the time between doses. The last dose that should be eliminated is the evening dose.      Post-operative opioid taper instructions:   Complete by: As directed  POST-OPERATIVE OPIOID TAPER INSTRUCTIONS: It is important to wean off of your opioid medication as soon as possible. If you do not need pain medication after your surgery  it is ok to stop day one. Opioids include: Codeine, Hydrocodone(Norco, Vicodin), Oxycodone(Percocet, oxycontin) and hydromorphone amongst others.  Long term and even short term use of opiods can cause: Increased pain response Dependence Constipation Depression Respiratory depression And more.  Withdrawal symptoms can include Flu like symptoms Nausea, vomiting And more Techniques to manage these symptoms Hydrate well Eat regular healthy meals Stay active Use relaxation techniques(deep breathing, meditating, yoga) Do Not substitute Alcohol to help with tapering If you have been on opioids for less than two weeks and do not have pain than it is ok to stop all together.  Plan to wean off of opioids This plan should start within one week post op of your joint replacement. Maintain the same interval or time between taking each dose and first decrease the dose.  Cut the total daily intake of opioids by one tablet each day Next start to increase the time between doses. The last dose that should be eliminated is the evening dose.           Follow-up Information     Susa Day, MD Follow up in 2 week(s).   Specialty: Orthopedic Surgery Contact information: [address] [place] [address] [phone]         [facility] Follow up.   Specialty: [place] Why: to provide home physical therapy visits Contact information: [place] [address] [phone]                  Signed: Johnn Hai 11/18/2022, 8:38 AM

## 2022-11-20 ENCOUNTER — Other Ambulatory Visit: Payer: Self-pay

## 2022-11-20 ENCOUNTER — Emergency Department (HOSPITAL_COMMUNITY)
Admission: EM | Admit: 2022-11-20 | Discharge: 2022-11-20 | Disposition: A | Discharge status: Home / Self Care | Payer: Medicare Other | Attending: Emergency Medicine | Admitting: Emergency Medicine

## 2022-11-20 ENCOUNTER — Encounter (HOSPITAL_COMMUNITY): Payer: Self-pay

## 2022-11-20 ENCOUNTER — Emergency Department (HOSPITAL_COMMUNITY): Payer: Medicare Other

## 2022-11-20 ENCOUNTER — Encounter (INDEPENDENT_AMBULATORY_CARE_PROVIDER_SITE_OTHER): Payer: Medicare Other

## 2022-11-20 DIAGNOSIS — W010XXA Fall on same level from slipping, tripping and stumbling without subsequent striking against object, initial encounter: Secondary | ICD-10-CM | POA: Insufficient documentation

## 2022-11-20 DIAGNOSIS — J449 Chronic obstructive pulmonary disease, unspecified: Secondary | ICD-10-CM | POA: Diagnosis not present

## 2022-11-20 DIAGNOSIS — W19XXXA Unspecified fall, initial encounter: Secondary | ICD-10-CM

## 2022-11-20 DIAGNOSIS — S39012A Strain of muscle, fascia and tendon of lower back, initial encounter: Secondary | ICD-10-CM | POA: Diagnosis not present

## 2022-11-20 DIAGNOSIS — S0990XA Unspecified injury of head, initial encounter: Secondary | ICD-10-CM | POA: Insufficient documentation

## 2022-11-20 DIAGNOSIS — M545 Low back pain, unspecified: Secondary | ICD-10-CM | POA: Diagnosis present

## 2022-11-20 MED ORDER — ALPRAZOLAM 0.5 MG PO TABS
1.0000 mg | ORAL_TABLET | Freq: Once | ORAL | Status: AC
  Administered 2022-11-20: 1 mg via ORAL
  Filled 2022-11-20: qty 2

## 2022-11-20 NOTE — ED Provider Notes (Addendum)
[facility] EMERGENCY DEPARTMENT Provider Note   CSN: [id number] Arrival date & time: 11/20/22  1037     History  Chief Complaint  Patient presents with   [facility] is a 66 y.o. female.  Patient ordered by EMS.  Patient has a fall this morning falling backwards.  Happened around 9 in the morning.  Patient with complaint of increased back pain.  He has chronic back pain and did hit the back of her head and she is on Xarelto.  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  Past medical history significant for COPD coronary disease fibromyalgia pulmonary embolism.  Patient was up on her feet since the fall.  And actually had physical therapy at home today and was able to go through that routine.  So denying any hip pain.  And patient able to ambulate.  Says that she has chronic neck pain chronic back pain neck pain maybe a little increased back pain a little increased.  Main concern was he hit the back of her head no bleeding.  She was worried because she was on a blood thinner.  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  The physical therapist felt there was no concerns for injury to the left knee.  The whole concern was for injury to the head.  Patient thinks that the left knee feels fine.  Her incisions are all intact.       Home Medications Prior to Admission medications   Medication Sig Start Date End Date Taking? Authorizing Provider  albuterol (VENTOLIN HFA) 108 (90 Base) MCG/ACT inhaler Inhale 2 puffs into the lungs every 6 (six) hours as needed for wheezing or shortness of breath. 05/12/21  Yes [provider]  ALPRAZolam Prudy Feeler) 1 MG tablet Take 1 mg by mouth 4 (four) times daily as needed for anxiety.   Yes [provider]  amoxicillin (AMOXIL) 500 MG capsule Take 1 capsule (500 mg total) by mouth 3 (three) times daily. 11/10/22  Yes Jene Every, MD  b complex vitamins capsule Take 1  capsule by mouth daily.   Yes [provider]  Cholecalciferol (VITAMIN D3) 1.25 MG (50000 UT) CAPS Take 50,000 Units by mouth every Wednesday. 02/16/22  Yes [provider]  diphenhydrAMINE (BENADRYL) 25 mg capsule Take 25 mg by mouth daily as needed for allergies.   Yes [provider]  docusate sodium (COLACE) 100 MG capsule Take 1 capsule (100 mg total) by mouth 2 (two) times daily as needed for mild constipation. 11/04/22  Yes Jene Every, MD  DULoxetine (CYMBALTA) 60 MG capsule Take 60 mg by mouth at bedtime. 09/14/18  Yes [provider]  famotidine (PEPCID) 20 MG tablet One after supper Patient taking differently: Take 20 mg by mouth daily. One after supper 04/30/22  Yes Nyoka Cowden, MD  Ferrous Fumarate-Folic Acid 324-1 MG TABS Take 1 tablet by mouth daily. Hematinic 02/29/20  Yes [provider]  gabapentin (NEURONTIN) 400 MG capsule Take 400 mg by mouth 3 (three) times daily. 08/21/22  Yes [provider]  Meclizine HCl 25 MG CHEW Chew 25 mg by mouth 3 (three) times daily as needed (dizziness).   Yes [provider]  melatonin 3 MG TABS tablet Take 3 mg by mouth at bedtime.   Yes [provider]  Multiple Vitamins-Minerals (MULTIVITAMIN GUMMIES ADULT PO) Take 1 tablet by mouth daily. With Zinc and vitamin C  Yes [provider]  naloxone (NARCAN) nasal spray 4 mg/0.1 mL Place 1 spray into the nose once as needed (accidental over dose). 10/16/21  Yes [provider]  oxyCODONE (OXY IR/ROXICODONE) 5 MG immediate release tablet Take 1 tablet (5 mg total) by mouth every 4 (four) hours as needed for severe pain. 11/10/22  Yes Jene Every, MD  pantoprazole (PROTONIX) 40 MG tablet Take 40 mg by mouth every morning. 06/30/16  Yes [provider]  polyethylene glycol (MIRALAX / GLYCOLAX) 17 g packet Take 17 g by mouth daily. 11/04/22  Yes Jene Every, MD  XARELTO 20 MG TABS tablet Take 20 mg by  mouth daily. 08/21/21  Yes [provider]  benzonatate (TESSALON) 100 MG capsule Take 1 capsule (100 mg total) by mouth every 8 (eight) hours. Patient not taking: Reported on 11/20/2022 07/05/22   Arthor Captain, PA-C  furosemide (LASIX) 20 MG tablet Take 1 tablet (20 mg total) by mouth daily. Patient not taking: Reported on 11/20/2022 08/27/22   Teressa Lower, PA-C      Allergies    Lyrica [pregabalin], Belbuca [buprenorphine hcl], Compazine [prochlorperazine], Droperidol, and Ergotamine-caffeine    Review of Systems   Review of Systems  Constitutional:  Negative for chills and fever.  HENT:  Negative for ear pain and sore throat.   Eyes:  Negative for pain and visual disturbance.  Respiratory:  Negative for cough and shortness of breath.   Cardiovascular:  Negative for chest pain and palpitations.  Gastrointestinal:  Negative for abdominal pain and vomiting.  Genitourinary:  Negative for dysuria and hematuria.  Musculoskeletal:  Positive for back pain and neck pain. Negative for arthralgias.  Skin:  Negative for color change and rash.  Neurological:  Positive for headaches. Negative for seizures and syncope.  All other systems reviewed and are negative.   Physical Exam Updated Vital Signs BP 99/79 (BP Location: Right Arm)   Pulse 93   Temp 98.7 F (37.1 C) (Oral)   Resp 15   SpO2 94%  Physical Exam Vitals and nursing note reviewed.  Constitutional:      General: She is not in acute distress.    Appearance: Normal appearance. She is well-developed.  HENT:     Head: Normocephalic and atraumatic.  Eyes:     Conjunctiva/sclera: Conjunctivae normal.     Pupils: Pupils are equal, round, and reactive to light.  Cardiovascular:     Rate and Rhythm: Normal rate and regular rhythm.     Heart sounds: No murmur heard. Pulmonary:     Effort: Pulmonary effort is normal. No respiratory distress.     Breath sounds: Normal breath sounds.  Abdominal:     Palpations: Abdomen  is soft.     Tenderness: There is no abdominal tenderness.  Musculoskeletal:        General: No swelling or tenderness.     Cervical back: Normal range of motion and neck supple.     Comments: Left knee with healing incision.  Not open.  No significant swelling.  Good range of motion of the knee.  No concerns for any bony injury to the knee.  Patient states she fell straight backwards.  Skin:    General: Skin is warm and dry.     Capillary Refill: Capillary refill takes less than 2 seconds.  Neurological:     General: No focal deficit present.     Mental Status: She is alert and oriented to person, place, and time.  Cranial Nerves: No cranial nerve deficit.     Sensory: No sensory deficit.     Motor: No weakness.  Psychiatric:        Mood and Affect: Mood normal.     ED Results / Procedures / Treatments   Labs (all labs ordered are listed, but only abnormal results are displayed) Labs Reviewed - No data to display  EKG EKG Interpretation  Date/Time:  Friday November 20 2022 10:50:40 EST Ventricular Rate:  95 PR Interval:  170 QRS Duration: 98 QT Interval:  356 QTC Calculation: 448 R Axis:   -26 Text Interpretation: Sinus rhythm Abnormal R-wave progression, early transition Left ventricular hypertrophy Confirmed by Vanetta Mulders [phone]) on 11/20/2022 11:07:12 AM  Radiology CT Lumbar Spine Wo Contrast  Result Date: 11/20/2022 CLINICAL DATA:  Back trauma.  Fall this morning. EXAM: CT LUMBAR SPINE WITHOUT CONTRAST TECHNIQUE: Multidetector CT imaging of the lumbar spine was performed without intravenous contrast administration. Multiplanar CT image reconstructions were also generated. RADIATION DOSE REDUCTION: This exam was performed according to the departmental dose-optimization program which includes automated exposure control, adjustment of the mA and/or kV according to patient size and/or use of iterative reconstruction technique. COMPARISON:  CT abdomen and pelvis  without contrast 04/19/2022 FINDINGS: Segmentation: There are 5 non-rib-bearing lumbar-type vertebral bodies. Alignment: 3 mm grade 1 anterolisthesis of L5 on S1 is unchanged from prior. No pars defect is seen, and this is likely developmental. 2 mm retrolisthesis of L3 on L4, unchanged. Vertebrae: Mild anterior wedging of the T12 vertebral body is unchanged from 04/19/2022 radiographs and chronic. This may be developmental. There is unchanged relative lucency within the mid transverse, far posterior L1 vertebral body (sagittal series 5, image 37, axial series 3, image 30) which is unchanged from prior and may represent the patient's normal vascular pedicle versus a small lucent hemangioma. Moderate posterosuperior L1 vertebral body concave degenerative Schmorl's nodes are unchanged. Mild right-greater-than-left T11-12, moderate left L3-4, and moderate to severe diffuse L5-S1 disc space narrowing. Endplate sclerosis, cystic changes, and vacuum phenomenon are greatest at L3-4 and L5-S1. No acute fracture is seen. 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 Mild atherosclerotic calcifications. The kidneys are partially visualized. Right lower pole 3 mm and 4 mm stones are minimally increased in size from 2 mm and 3 mm, respectively on 04/19/2022 CT. No hydronephrosis within either kidney. Disc levels: T12-L1: Mild bilateral facet joint hypertrophy. Mild broad-based posterior disc osteophyte complex. Mild right lateral recess narrowing. No significant central canal stenosis. No neuroforaminal stenosis. L1-2: Mild to moderate right and mild left facet joint hypertrophy. Mild right neuroforaminal narrowing. Mild peripheral left broad-based posterior disc osteophyte complex. No significant central canal stenosis. L2-3: Mild-to-moderate bilateral facet joint hypertrophy. Mild broad-based posterior disc osteophyte complex. Mild left  neuroforaminal narrowing. Mild central canal stenosis. L3-4: Mild-to-moderate bilateral facet joint hypertrophy. Mild broad-based posterior disc osteophyte complex with moderate left intraforaminal extension. Mild-to-moderate left neuroforaminal stenosis. Mild narrowing of the lateral recesses. No central canal stenosis. L4-5: Moderate left-greater-than-right facet joint hypertrophy and ligamentum flavum hypertrophy. Mild broad-based posterior disc bulge. Very mild bilateral neuroforaminal narrowing without true stenosis. Mild-to-moderate central canal stenosis. L5-S1: Moderate left-greater-than-right facet joint hypertrophy. Moderate broad-based posterior disc osteophyte complex with moderate bilateral intraforaminal extension. Moderate to severe bilateral neuroforaminal stenosis, similar to prior. Mild narrowing of lateral recesses. Mild central canal stenosis. IMPRESSION: 1. No acute fracture of the lumbar spine. 2.  Multilevel degenerative disc and joint changes as above. 3. L5-S1 moderate to severe bilateral neuroforaminal stenosis. 4. L4-5 very mild bilateral neuroforaminal narrowing. 5. L3-4 mild-to-moderate left neuroforaminal stenosis. 6. L2-3 mild left neuroforaminal narrowing. 7. Mild-to-moderate L4-5 and mild L5-S1 central canal stenosis. Electronically Signed   By: Neita Garnet M.D.   On: 11/20/2022 12:28   CT Head Wo Contrast  Result Date: 11/20/2022 CLINICAL DATA:  Head trauma. Status post fall. Patient is on Xarelto. EXAM: CT HEAD WITHOUT CONTRAST CT CERVICAL SPINE WITHOUT CONTRAST TECHNIQUE: Multidetector CT imaging of the head and cervical spine was performed following the standard protocol without intravenous contrast. Multiplanar CT image reconstructions of the cervical spine were also generated. RADIATION DOSE REDUCTION: This exam was performed according to the departmental dose-optimization program which includes automated exposure control, adjustment of the mA and/or kV according to  patient size and/or use of iterative reconstruction technique. COMPARISON:  None Available. FINDINGS: CT HEAD FINDINGS Brain: No evidence of acute infarction, hemorrhage, hydrocephalus, extra-axial collection or mass lesion/mass effect. Vascular: No hyperdense vessel or unexpected calcification. Skull: Normal. Negative for fracture or focal lesion. Sinuses/Orbits: The paranasal sinuses and the mastoid air cells are clear. Other: None. CT CERVICAL SPINE FINDINGS Alignment: Normal alignment of the cervical spine. Skull base and vertebrae: No acute fracture. No primary bone lesion or focal pathologic process. Soft tissues and spinal canal: No prevertebral fluid or swelling. No visible canal hematoma. Disc levels: Disc space narrowing and endplate spurring is noted at C4-5, C5-6 and C6-7. Upper chest: No acute findings. Nodule arising off the posterior right lobe of thyroid gland measures 2.1 x 1.4 cm, image 73/4. This has been evaluated on previous imaging. (ref: J Am Coll Radiol. 2015 Feb;12(2): 143-50). Other: None IMPRESSION: 1. No acute intracranial abnormality. 2. No evidence for cervical spine fracture or subluxation. 3. Cervical degenerative disc disease. Electronically Signed   By: Signa Kell M.D.   On: 11/20/2022 12:17   CT Cervical Spine Wo Contrast  Result Date: 11/20/2022 CLINICAL DATA:  Head trauma. Status post fall. Patient is on Xarelto. EXAM: CT HEAD WITHOUT CONTRAST CT CERVICAL SPINE WITHOUT CONTRAST TECHNIQUE: Multidetector CT imaging of the head and cervical spine was performed following the standard protocol without intravenous contrast. Multiplanar CT image reconstructions of the cervical spine were also generated. RADIATION DOSE REDUCTION: This exam was performed according to the departmental dose-optimization program which includes automated exposure control, adjustment of the mA and/or kV according to patient size and/or use of iterative reconstruction technique. COMPARISON:  None  Available. FINDINGS: CT HEAD FINDINGS Brain: No evidence of acute infarction, hemorrhage, hydrocephalus, extra-axial collection or mass lesion/mass effect. Vascular: No hyperdense vessel or unexpected calcification. Skull: Normal. Negative for fracture or focal lesion. Sinuses/Orbits: The paranasal sinuses and the mastoid air cells are clear. Other: None. CT CERVICAL SPINE FINDINGS Alignment: Normal alignment of the cervical spine. Skull base and vertebrae: No acute fracture. No primary bone lesion or focal pathologic process. Soft tissues and spinal canal: No prevertebral fluid or swelling. No visible canal hematoma. Disc levels: Disc space narrowing and endplate spurring is noted at C4-5, C5-6 and C6-7. Upper chest: No acute findings. Nodule arising off the posterior right lobe of thyroid gland measures 2.1 x 1.4 cm, image 73/4. This has been evaluated on previous imaging. (ref: J Am Coll Radiol. 2015 Feb;12(2): 143-50). Other: None IMPRESSION: 1. No acute intracranial abnormality. 2. No evidence for cervical spine fracture or subluxation. 3. Cervical degenerative disc disease. Electronically Signed   By:  Signa Kellaylor  Stroud M.D.   On: 11/20/2022 12:17    Procedures Procedures    Medications Ordered in ED Medications  ALPRAZolam Prudy Feeler(XANAX) tablet 1 mg (1 mg Oral Given 11/20/22 1112)    ED Course/ Medical Decision Making/ A&P                           Medical Decision Making Amount and/or Complexity of Data Reviewed Radiology: ordered.  Risk Prescription drug management.   Patient CT head neck without any acute abnormalities also CT of the lumbar spine a lot of abnormalities in that area but nothing acute.  Patient without any radicular symptoms.  Patient's left knee replacement seems to be doing fine also physical therapy had gone to the routine at home and had no concerns of any injury to the knee.  The incision is intact.  Patient stable for discharge home.  Final Clinical Impression(s) / ED  Diagnoses Final diagnoses:  Fall, initial encounter  Injury of head, initial encounter  Strain of lumbar region, initial encounter    Rx / DC Orders ED Discharge Orders     None         Vanetta MuldersZackowski, Demarrion Meiklejohn, MD 11/20/22 1312    Vanetta MuldersZackowski, Jerriyah Louis, MD 11/20/22 1344

## 2022-11-20 NOTE — Discharge Instructions (Addendum)
Comfortable follow-up without any acute abnormalities.  CT scan lumbar spine no fractures or acute injuries.  Lots of degenerative changes and disc problems pre-existing.  CT head and neck without any acute findings.  Follow-up with your doctors as needed.

## 2022-11-20 NOTE — ED Triage Notes (Signed)
Pt presents to ED following fall this am, hitting head. Pt on Xarelto. Pt unsure what caused her to fall. Hit head on floor. Pt denies LOC

## 2022-11-30 ENCOUNTER — Other Ambulatory Visit (HOSPITAL_COMMUNITY): Payer: Self-pay | Admitting: Internal Medicine

## 2022-11-30 DIAGNOSIS — Z1231 Encounter for screening mammogram for malignant neoplasm of breast: Secondary | ICD-10-CM

## 2022-12-11 ENCOUNTER — Other Ambulatory Visit: Payer: Self-pay

## 2022-12-11 ENCOUNTER — Encounter (HOSPITAL_COMMUNITY): Payer: Self-pay | Admitting: Internal Medicine

## 2022-12-11 ENCOUNTER — Inpatient Hospital Stay (HOSPITAL_COMMUNITY)
Admission: EM | Admit: 2022-12-11 | Discharge: 2022-12-17 | DRG: 908 | Disposition: A | Discharge status: Skilled Nursing Facility | Payer: Medicare Other | Attending: Internal Medicine | Admitting: Internal Medicine

## 2022-12-11 ENCOUNTER — Inpatient Hospital Stay (HOSPITAL_COMMUNITY): Payer: Medicare Other | Admitting: Anesthesiology

## 2022-12-11 ENCOUNTER — Emergency Department (HOSPITAL_COMMUNITY): Payer: Medicare Other

## 2022-12-11 ENCOUNTER — Encounter (HOSPITAL_COMMUNITY)
Admission: EM | Disposition: A | Discharge status: Skilled Nursing Facility | Payer: Self-pay | Source: Home / Self Care | Attending: Internal Medicine

## 2022-12-11 ENCOUNTER — Inpatient Hospital Stay (HOSPITAL_COMMUNITY): Payer: Medicare Other

## 2022-12-11 DIAGNOSIS — R531 Weakness: Secondary | ICD-10-CM | POA: Diagnosis present

## 2022-12-11 DIAGNOSIS — R739 Hyperglycemia, unspecified: Secondary | ICD-10-CM | POA: Diagnosis not present

## 2022-12-11 DIAGNOSIS — J449 Chronic obstructive pulmonary disease, unspecified: Secondary | ICD-10-CM | POA: Diagnosis not present

## 2022-12-11 DIAGNOSIS — R509 Fever, unspecified: Secondary | ICD-10-CM | POA: Diagnosis not present

## 2022-12-11 DIAGNOSIS — J9611 Chronic respiratory failure with hypoxia: Secondary | ICD-10-CM

## 2022-12-11 DIAGNOSIS — E669 Obesity, unspecified: Secondary | ICD-10-CM | POA: Diagnosis present

## 2022-12-11 DIAGNOSIS — F32A Depression, unspecified: Secondary | ICD-10-CM | POA: Diagnosis present

## 2022-12-11 DIAGNOSIS — Z87891 Personal history of nicotine dependence: Secondary | ICD-10-CM | POA: Diagnosis not present

## 2022-12-11 DIAGNOSIS — F411 Generalized anxiety disorder: Secondary | ICD-10-CM | POA: Diagnosis present

## 2022-12-11 DIAGNOSIS — Z9981 Dependence on supplemental oxygen: Secondary | ICD-10-CM | POA: Diagnosis not present

## 2022-12-11 DIAGNOSIS — G4733 Obstructive sleep apnea (adult) (pediatric): Secondary | ICD-10-CM | POA: Diagnosis present

## 2022-12-11 DIAGNOSIS — G8929 Other chronic pain: Secondary | ICD-10-CM | POA: Diagnosis present

## 2022-12-11 DIAGNOSIS — Z90710 Acquired absence of both cervix and uterus: Secondary | ICD-10-CM

## 2022-12-11 DIAGNOSIS — S86892A Other injury of other muscle(s) and tendon(s) at lower leg level, left leg, initial encounter: Principal | ICD-10-CM

## 2022-12-11 DIAGNOSIS — Y831 Surgical operation with implant of artificial internal device as the cause of abnormal reaction of the patient, or of later complication, without mention of misadventure at the time of the procedure: Secondary | ICD-10-CM | POA: Diagnosis present

## 2022-12-11 DIAGNOSIS — Z888 Allergy status to other drugs, medicaments and biological substances status: Secondary | ICD-10-CM

## 2022-12-11 DIAGNOSIS — K5900 Constipation, unspecified: Secondary | ICD-10-CM | POA: Diagnosis not present

## 2022-12-11 DIAGNOSIS — Z86711 Personal history of pulmonary embolism: Secondary | ICD-10-CM

## 2022-12-11 DIAGNOSIS — Z95828 Presence of other vascular implants and grafts: Secondary | ICD-10-CM | POA: Diagnosis not present

## 2022-12-11 DIAGNOSIS — Z79899 Other long term (current) drug therapy: Secondary | ICD-10-CM

## 2022-12-11 DIAGNOSIS — M542 Cervicalgia: Secondary | ICD-10-CM | POA: Diagnosis not present

## 2022-12-11 DIAGNOSIS — S83006A Unspecified dislocation of unspecified patella, initial encounter: Secondary | ICD-10-CM | POA: Diagnosis present

## 2022-12-11 DIAGNOSIS — I2510 Atherosclerotic heart disease of native coronary artery without angina pectoris: Secondary | ICD-10-CM | POA: Diagnosis present

## 2022-12-11 DIAGNOSIS — K219 Gastro-esophageal reflux disease without esophagitis: Secondary | ICD-10-CM | POA: Diagnosis not present

## 2022-12-11 DIAGNOSIS — M797 Fibromyalgia: Secondary | ICD-10-CM | POA: Diagnosis present

## 2022-12-11 DIAGNOSIS — T8131XA Disruption of external operation (surgical) wound, not elsewhere classified, initial encounter: Secondary | ICD-10-CM | POA: Diagnosis not present

## 2022-12-11 DIAGNOSIS — R609 Edema, unspecified: Secondary | ICD-10-CM | POA: Diagnosis not present

## 2022-12-11 DIAGNOSIS — Z6837 Body mass index (BMI) 37.0-37.9, adult: Secondary | ICD-10-CM

## 2022-12-11 DIAGNOSIS — D62 Acute posthemorrhagic anemia: Secondary | ICD-10-CM | POA: Diagnosis not present

## 2022-12-11 DIAGNOSIS — M25062 Hemarthrosis, left knee: Secondary | ICD-10-CM

## 2022-12-11 DIAGNOSIS — I10 Essential (primary) hypertension: Secondary | ICD-10-CM | POA: Diagnosis present

## 2022-12-11 DIAGNOSIS — Z96653 Presence of artificial knee joint, bilateral: Secondary | ICD-10-CM | POA: Diagnosis present

## 2022-12-11 DIAGNOSIS — W19XXXA Unspecified fall, initial encounter: Secondary | ICD-10-CM | POA: Diagnosis present

## 2022-12-11 DIAGNOSIS — T8130XA Disruption of wound, unspecified, initial encounter: Secondary | ICD-10-CM | POA: Diagnosis not present

## 2022-12-11 DIAGNOSIS — Z23 Encounter for immunization: Secondary | ICD-10-CM | POA: Diagnosis present

## 2022-12-11 DIAGNOSIS — R600 Localized edema: Secondary | ICD-10-CM | POA: Diagnosis not present

## 2022-12-11 DIAGNOSIS — M7989 Other specified soft tissue disorders: Secondary | ICD-10-CM | POA: Diagnosis not present

## 2022-12-11 DIAGNOSIS — S83005A Unspecified dislocation of left patella, initial encounter: Secondary | ICD-10-CM

## 2022-12-11 DIAGNOSIS — Z8249 Family history of ischemic heart disease and other diseases of the circulatory system: Secondary | ICD-10-CM

## 2022-12-11 DIAGNOSIS — Z7901 Long term (current) use of anticoagulants: Secondary | ICD-10-CM

## 2022-12-11 DIAGNOSIS — Z86718 Personal history of other venous thrombosis and embolism: Secondary | ICD-10-CM

## 2022-12-11 HISTORY — DX: Generalized anxiety disorder: F41.1

## 2022-12-11 HISTORY — PX: I & D EXTREMITY: SHX5045

## 2022-12-11 LAB — CBC WITH DIFFERENTIAL/PLATELET
Abs Immature Granulocytes: 0.04 10*3/uL (ref 0.00–0.07)
Basophils Absolute: 0 10*3/uL (ref 0.0–0.1)
Basophils Relative: 0 %
Eosinophils Absolute: 0.2 10*3/uL (ref 0.0–0.5)
Eosinophils Relative: 2 %
HCT: 37.6 % (ref 36.0–46.0)
Hemoglobin: 11.4 g/dL — ABNORMAL LOW (ref 12.0–15.0)
Immature Granulocytes: 0 %
Lymphocytes Relative: 21 %
Lymphs Abs: 2 10*3/uL (ref 0.7–4.0)
MCH: 31.2 pg (ref 26.0–34.0)
MCHC: 30.3 g/dL (ref 30.0–36.0)
MCV: 103 fL — ABNORMAL HIGH (ref 80.0–100.0)
Monocytes Absolute: 0.9 10*3/uL (ref 0.1–1.0)
Monocytes Relative: 9 %
Neutro Abs: 6.5 10*3/uL (ref 1.7–7.7)
Neutrophils Relative %: 68 %
Platelets: 194 10*3/uL (ref 150–400)
RBC: 3.65 MIL/uL — ABNORMAL LOW (ref 3.87–5.11)
RDW: 14.4 % (ref 11.5–15.5)
WBC: 9.7 10*3/uL (ref 4.0–10.5)
nRBC: 0 % (ref 0.0–0.2)

## 2022-12-11 LAB — COMPREHENSIVE METABOLIC PANEL
ALT: 17 U/L (ref 0–44)
AST: 19 U/L (ref 15–41)
Albumin: 2.8 g/dL — ABNORMAL LOW (ref 3.5–5.0)
Alkaline Phosphatase: 89 U/L (ref 38–126)
Anion gap: 8 (ref 5–15)
BUN: 12 mg/dL (ref 8–23)
CO2: 25 mmol/L (ref 22–32)
Calcium: 8.9 mg/dL (ref 8.9–10.3)
Chloride: 108 mmol/L (ref 98–111)
Creatinine, Ser: 0.83 mg/dL (ref 0.44–1.00)
GFR, Estimated: >60 mL/min (ref >60)
Glucose, Bld: 117 mg/dL — ABNORMAL HIGH (ref 70–99)
Potassium: 3.5 mmol/L (ref 3.5–5.1)
Sodium: 141 mmol/L (ref 135–145)
Total Bilirubin: 0.5 mg/dL (ref 0.3–1.2)
Total Protein: 6.4 g/dL — ABNORMAL LOW (ref 6.5–8.1)

## 2022-12-11 LAB — SEDIMENTATION RATE: Sed Rate: 25 mm/hr — ABNORMAL HIGH (ref 0–22)

## 2022-12-11 LAB — APTT: aPTT: 20 seconds — ABNORMAL LOW (ref 24–36)

## 2022-12-11 LAB — PROTIME-INR
INR: 1.4 — ABNORMAL HIGH (ref 0.8–1.2)
Prothrombin Time: 17.4 seconds — ABNORMAL HIGH (ref 11.4–15.2)

## 2022-12-11 LAB — C-REACTIVE PROTEIN: CRP: 3 mg/dL — ABNORMAL HIGH (ref <1.0)

## 2022-12-11 SURGERY — IRRIGATION AND DEBRIDEMENT EXTREMITY
Anesthesia: General | Site: Knee | Laterality: Left

## 2022-12-11 MED ORDER — LACTATED RINGERS IV BOLUS
1000.0000 mL | Freq: Once | INTRAVENOUS | Status: AC
  Administered 2022-12-11: 1000 mL via INTRAVENOUS

## 2022-12-11 MED ORDER — HYDROMORPHONE HCL 2 MG/ML IJ SOLN
INTRAMUSCULAR | Status: AC
  Filled 2022-12-11: qty 1

## 2022-12-11 MED ORDER — LIDOCAINE HCL (PF) 2 % IJ SOLN
INTRAMUSCULAR | Status: AC
  Filled 2022-12-11: qty 5

## 2022-12-11 MED ORDER — ONDANSETRON HCL 4 MG/2ML IJ SOLN
INTRAMUSCULAR | Status: AC
  Filled 2022-12-11: qty 2

## 2022-12-11 MED ORDER — METOCLOPRAMIDE HCL 5 MG/ML IJ SOLN
5.0000 mg | Freq: Three times a day (TID) | INTRAMUSCULAR | Status: DC | PRN
  Administered 2022-12-11: 10 mg via INTRAVENOUS
  Filled 2022-12-11 (×2): qty 2

## 2022-12-11 MED ORDER — ONDANSETRON HCL 4 MG/2ML IJ SOLN: 4.0000 mg | Freq: Once | INTRAMUSCULAR | Status: DC | PRN

## 2022-12-11 MED ORDER — ONDANSETRON HCL 4 MG/2ML IJ SOLN: 4.0000 mg | Freq: Four times a day (QID) | INTRAMUSCULAR | Status: DC | PRN

## 2022-12-11 MED ORDER — TRANEXAMIC ACID-NACL 1000-0.7 MG/100ML-% IV SOLN
1000.0000 mg | INTRAVENOUS | Status: AC
  Administered 2022-12-11: 1000 mg via INTRAVENOUS
  Filled 2022-12-11: qty 100

## 2022-12-11 MED ORDER — CEFAZOLIN SODIUM-DEXTROSE 2-4 GM/100ML-% IV SOLN
2.0000 g | INTRAVENOUS | Status: AC
  Administered 2022-12-11: 2 g via INTRAVENOUS
  Filled 2022-12-11: qty 100

## 2022-12-11 MED ORDER — GABAPENTIN 400 MG PO CAPS
400.0000 mg | ORAL_CAPSULE | Freq: Three times a day (TID) | ORAL | Status: DC
  Administered 2022-12-11 – 2022-12-17 (×19): 400 mg via ORAL
  Filled 2022-12-11 (×19): qty 1

## 2022-12-11 MED ORDER — OXYCODONE HCL 5 MG/5ML PO SOLN: 5.0000 mg | Freq: Once | ORAL | Status: AC | PRN

## 2022-12-11 MED ORDER — FENTANYL CITRATE (PF) 100 MCG/2ML IJ SOLN
INTRAMUSCULAR | Status: DC | PRN
  Administered 2022-12-11 (×2): 25 ug via INTRAVENOUS
  Administered 2022-12-11 (×2): 50 ug via INTRAVENOUS

## 2022-12-11 MED ORDER — FENTANYL CITRATE PF 50 MCG/ML IJ SOSY
PREFILLED_SYRINGE | INTRAMUSCULAR | Status: AC
  Filled 2022-12-11: qty 1

## 2022-12-11 MED ORDER — ALBUTEROL SULFATE HFA 108 (90 BASE) MCG/ACT IN AERS: 2.0000 | INHALATION_SPRAY | Freq: Four times a day (QID) | RESPIRATORY_TRACT | Status: DC | PRN

## 2022-12-11 MED ORDER — POLYETHYLENE GLYCOL 3350 17 G PO PACK
17.0000 g | PACK | Freq: Every day | ORAL | Status: DC
  Administered 2022-12-12 – 2022-12-15 (×4): 17 g via ORAL
  Filled 2022-12-11 (×4): qty 1

## 2022-12-11 MED ORDER — IPRATROPIUM-ALBUTEROL 0.5-2.5 (3) MG/3ML IN SOLN: 3.0000 mL | RESPIRATORY_TRACT | Status: DC | PRN

## 2022-12-11 MED ORDER — HYDROMORPHONE HCL 1 MG/ML IJ SOLN
0.2500 mg | INTRAMUSCULAR | Status: DC | PRN
  Administered 2022-12-11: 0.5 mg via INTRAVENOUS

## 2022-12-11 MED ORDER — CEFAZOLIN SODIUM-DEXTROSE 1-4 GM/50ML-% IV SOLN
1.0000 g | Freq: Four times a day (QID) | INTRAVENOUS | Status: AC
  Administered 2022-12-11 – 2022-12-12 (×3): 1 g via INTRAVENOUS
  Filled 2022-12-11 (×3): qty 50

## 2022-12-11 MED ORDER — TRANEXAMIC ACID-NACL 1000-0.7 MG/100ML-% IV SOLN
1000.0000 mg | Freq: Once | INTRAVENOUS | Status: AC
  Administered 2022-12-11: 1000 mg via INTRAVENOUS
  Filled 2022-12-11: qty 100

## 2022-12-11 MED ORDER — DEXAMETHASONE SODIUM PHOSPHATE 4 MG/ML IJ SOLN
INTRAMUSCULAR | Status: DC | PRN
  Administered 2022-12-11: 8 mg via INTRAVENOUS

## 2022-12-11 MED ORDER — BUPIVACAINE-EPINEPHRINE (PF) 0.5% -1:200000 IJ SOLN
INTRAMUSCULAR | Status: AC
  Filled 2022-12-11: qty 30

## 2022-12-11 MED ORDER — PNEUMOCOCCAL 20-VAL CONJ VACC 0.5 ML IM SUSY
0.5000 mL | PREFILLED_SYRINGE | INTRAMUSCULAR | Status: DC
  Filled 2022-12-11: qty 0.5

## 2022-12-11 MED ORDER — HYDROMORPHONE HCL 1 MG/ML IJ SOLN
INTRAMUSCULAR | Status: DC | PRN
  Administered 2022-12-11 (×4): .5 mg via INTRAVENOUS

## 2022-12-11 MED ORDER — KETAMINE HCL 10 MG/ML IJ SOLN
INTRAMUSCULAR | Status: DC | PRN
  Administered 2022-12-11: 30 mg via INTRAVENOUS

## 2022-12-11 MED ORDER — FAMOTIDINE 20 MG PO TABS
20.0000 mg | ORAL_TABLET | Freq: Every day | ORAL | Status: DC
  Administered 2022-12-12 – 2022-12-17 (×6): 20 mg via ORAL
  Filled 2022-12-11 (×6): qty 1

## 2022-12-11 MED ORDER — LABETALOL HCL 5 MG/ML IV SOLN: 10.0000 mg | INTRAVENOUS | Status: DC | PRN

## 2022-12-11 MED ORDER — VANCOMYCIN HCL 1000 MG IV SOLR
INTRAVENOUS | Status: DC | PRN
  Administered 2022-12-11: 1000 mg via TOPICAL

## 2022-12-11 MED ORDER — DOCUSATE SODIUM 100 MG PO CAPS: 100.0000 mg | ORAL_CAPSULE | Freq: Two times a day (BID) | ORAL | Status: DC | PRN

## 2022-12-11 MED ORDER — FENTANYL CITRATE (PF) 100 MCG/2ML IJ SOLN
INTRAMUSCULAR | Status: AC
  Filled 2022-12-11: qty 2

## 2022-12-11 MED ORDER — OXYCODONE HCL 5 MG PO TABS: 5.0000 mg | ORAL_TABLET | Freq: Once | ORAL | Status: AC | PRN

## 2022-12-11 MED ORDER — ALBUTEROL SULFATE (2.5 MG/3ML) 0.083% IN NEBU: 2.5000 mg | INHALATION_SOLUTION | Freq: Four times a day (QID) | RESPIRATORY_TRACT | Status: DC | PRN

## 2022-12-11 MED ORDER — FENTANYL CITRATE PF 50 MCG/ML IJ SOSY
50.0000 ug | PREFILLED_SYRINGE | Freq: Once | INTRAMUSCULAR | Status: AC
  Administered 2022-12-11: 50 ug via INTRAVENOUS

## 2022-12-11 MED ORDER — AMISULPRIDE (ANTIEMETIC) 5 MG/2ML IV SOLN
INTRAVENOUS | Status: AC
  Filled 2022-12-11: qty 2

## 2022-12-11 MED ORDER — CHLORHEXIDINE GLUCONATE 4 % EX LIQD: 60.0000 mL | Freq: Once | CUTANEOUS | Status: DC

## 2022-12-11 MED ORDER — DIPHENHYDRAMINE HCL 25 MG PO CAPS
25.0000 mg | ORAL_CAPSULE | Freq: Once | ORAL | Status: AC
  Administered 2022-12-11: 25 mg via ORAL
  Filled 2022-12-11: qty 1

## 2022-12-11 MED ORDER — ONDANSETRON HCL 4 MG/2ML IJ SOLN
INTRAMUSCULAR | Status: DC | PRN
  Administered 2022-12-11: 4 mg via INTRAVENOUS

## 2022-12-11 MED ORDER — VANCOMYCIN HCL 1000 MG IV SOLR
INTRAVENOUS | Status: AC
  Filled 2022-12-11: qty 20

## 2022-12-11 MED ORDER — DULOXETINE HCL 30 MG PO CPEP
60.0000 mg | ORAL_CAPSULE | Freq: Every day | ORAL | Status: DC
  Administered 2022-12-11 – 2022-12-17 (×7): 60 mg via ORAL
  Filled 2022-12-11 (×7): qty 2

## 2022-12-11 MED ORDER — OXYCODONE HCL 5 MG PO TABS
5.0000 mg | ORAL_TABLET | ORAL | Status: DC | PRN
  Administered 2022-12-12 – 2022-12-15 (×10): 5 mg via ORAL
  Filled 2022-12-11 (×11): qty 1

## 2022-12-11 MED ORDER — CEFAZOLIN SODIUM-DEXTROSE 2-4 GM/100ML-% IV SOLN
2.0000 g | Freq: Once | INTRAVENOUS | Status: AC
  Administered 2022-12-11: 2 g via INTRAVENOUS
  Filled 2022-12-11: qty 100

## 2022-12-11 MED ORDER — OXYCODONE HCL 5 MG PO TABS
ORAL_TABLET | ORAL | Status: AC
  Administered 2022-12-11: 5 mg via ORAL
  Filled 2022-12-11: qty 1

## 2022-12-11 MED ORDER — INFLUENZA VAC A&B SA ADJ QUAD 0.5 ML IM PRSY
0.5000 mL | PREFILLED_SYRINGE | INTRAMUSCULAR | Status: AC
  Administered 2022-12-15: 0.5 mL via INTRAMUSCULAR
  Filled 2022-12-11 (×2): qty 0.5

## 2022-12-11 MED ORDER — MIDAZOLAM HCL 5 MG/5ML IJ SOLN
INTRAMUSCULAR | Status: DC | PRN
  Administered 2022-12-11 (×2): 1 mg via INTRAVENOUS

## 2022-12-11 MED ORDER — MELATONIN 3 MG PO TABS
3.0000 mg | ORAL_TABLET | Freq: Every day | ORAL | Status: DC
  Administered 2022-12-11 – 2022-12-16 (×6): 3 mg via ORAL
  Filled 2022-12-11 (×6): qty 1

## 2022-12-11 MED ORDER — MORPHINE SULFATE (PF) 4 MG/ML IV SOLN
4.0000 mg | INTRAVENOUS | Status: DC | PRN
  Administered 2022-12-11 – 2022-12-12 (×3): 4 mg via INTRAVENOUS
  Filled 2022-12-11 (×4): qty 1

## 2022-12-11 MED ORDER — BUPIVACAINE-EPINEPHRINE (PF) 0.5% -1:200000 IJ SOLN
INTRAMUSCULAR | Status: DC | PRN
  Administered 2022-12-11: 30 mL

## 2022-12-11 MED ORDER — PANTOPRAZOLE SODIUM 40 MG PO TBEC
40.0000 mg | DELAYED_RELEASE_TABLET | Freq: Every day | ORAL | Status: DC
  Administered 2022-12-11 – 2022-12-17 (×7): 40 mg via ORAL
  Filled 2022-12-11 (×7): qty 1

## 2022-12-11 MED ORDER — PROPOFOL 10 MG/ML IV BOLUS
INTRAVENOUS | Status: AC
  Filled 2022-12-11: qty 20

## 2022-12-11 MED ORDER — HYDROMORPHONE HCL 1 MG/ML IJ SOLN
INTRAMUSCULAR | Status: AC
  Administered 2022-12-11: 0.25 mg via INTRAVENOUS
  Filled 2022-12-11: qty 1

## 2022-12-11 MED ORDER — METOCLOPRAMIDE HCL 5 MG PO TABS: 5.0000 mg | ORAL_TABLET | Freq: Three times a day (TID) | ORAL | Status: DC | PRN

## 2022-12-11 MED ORDER — DEXAMETHASONE SODIUM PHOSPHATE 10 MG/ML IJ SOLN
INTRAMUSCULAR | Status: AC
  Filled 2022-12-11: qty 1

## 2022-12-11 MED ORDER — ALPRAZOLAM 0.5 MG PO TABS: 1.0000 mg | ORAL_TABLET | Freq: Four times a day (QID) | ORAL | Status: DC | PRN

## 2022-12-11 MED ORDER — MIDAZOLAM HCL 2 MG/2ML IJ SOLN
INTRAMUSCULAR | Status: AC
  Filled 2022-12-11: qty 2

## 2022-12-11 MED ORDER — LABETALOL HCL 5 MG/ML IV SOLN
INTRAVENOUS | Status: AC
  Filled 2022-12-11: qty 4

## 2022-12-11 MED ORDER — SODIUM CHLORIDE 0.9 % IR SOLN
Status: DC | PRN
  Administered 2022-12-11: 3000 mL

## 2022-12-11 MED ORDER — POVIDONE-IODINE 10 % EX SWAB
2.0000 | Freq: Once | CUTANEOUS | Status: AC
  Administered 2022-12-11: 2 via TOPICAL

## 2022-12-11 MED ORDER — AMISULPRIDE (ANTIEMETIC) 5 MG/2ML IV SOLN
10.0000 mg | Freq: Once | INTRAVENOUS | Status: AC | PRN
  Administered 2022-12-11: 10 mg via INTRAVENOUS

## 2022-12-11 MED ORDER — LACTATED RINGERS IV SOLN: INTRAVENOUS | Status: AC

## 2022-12-11 MED ORDER — LIDOCAINE HCL (CARDIAC) PF 100 MG/5ML IV SOSY
PREFILLED_SYRINGE | INTRAVENOUS | Status: DC | PRN
  Administered 2022-12-11: 100 mg via INTRAVENOUS

## 2022-12-11 MED ORDER — ONDANSETRON HCL 4 MG PO TABS: 4.0000 mg | ORAL_TABLET | Freq: Four times a day (QID) | ORAL | Status: DC | PRN

## 2022-12-11 MED ORDER — CHLORHEXIDINE GLUCONATE 0.12 % MT SOLN
15.0000 mL | Freq: Once | OROMUCOSAL | Status: AC
  Administered 2022-12-11: 15 mL via OROMUCOSAL

## 2022-12-11 MED ORDER — PROPOFOL 10 MG/ML IV BOLUS
INTRAVENOUS | Status: DC | PRN
  Administered 2022-12-11: 150 mg via INTRAVENOUS
  Administered 2022-12-11: 50 mg via INTRAVENOUS

## 2022-12-11 MED ORDER — ACETAMINOPHEN 500 MG PO TABS
1000.0000 mg | ORAL_TABLET | Freq: Once | ORAL | Status: AC
  Administered 2022-12-11: 1000 mg via ORAL
  Filled 2022-12-11: qty 2

## 2022-12-11 MED ORDER — DOCUSATE SODIUM 100 MG PO CAPS
100.0000 mg | ORAL_CAPSULE | Freq: Two times a day (BID) | ORAL | Status: DC
  Administered 2022-12-11: 100 mg via ORAL
  Filled 2022-12-11: qty 1

## 2022-12-11 SURGICAL SUPPLY — 43 items
BAG COUNTER SPONGE SURGICOUNT (BAG) ×2 IMPLANT
BAG SPNG CNTER NS LX DISP (BAG) ×1
BNDG CMPR 5X4 CHSV STRCH STRL (GAUZE/BANDAGES/DRESSINGS) ×1
BNDG COHESIVE 4X5 TAN STRL LF (GAUZE/BANDAGES/DRESSINGS) ×2 IMPLANT
BNDG ELASTIC 3X5.8 VLCR STR LF (GAUZE/BANDAGES/DRESSINGS) IMPLANT
COVER SURGICAL LIGHT HANDLE (MISCELLANEOUS) ×2 IMPLANT
DRAIN PENROSE 0.25X18 (DRAIN) ×2 IMPLANT
DRAPE U-SHAPE 47X51 STRL (DRAPES) ×2 IMPLANT
DRSG AQUACEL AG ADV 3.5X10 (GAUZE/BANDAGES/DRESSINGS) IMPLANT
DRSG AQUACEL AG ADV 3.5X14 (GAUZE/BANDAGES/DRESSINGS) IMPLANT
DRSG EMULSION OIL 3X3 NADH (GAUZE/BANDAGES/DRESSINGS) ×2 IMPLANT
DURAPREP 26ML APPLICATOR (WOUND CARE) ×2 IMPLANT
ELECT REM PT RETURN 15FT ADLT (MISCELLANEOUS) IMPLANT
GAUZE SPONGE 4X4 12PLY STRL (GAUZE/BANDAGES/DRESSINGS) ×4 IMPLANT
GLOVE BIO SURGEON STRL SZ7.5 (GLOVE) ×2 IMPLANT
GLOVE BIOGEL PI IND STRL 8 (GLOVE) ×2 IMPLANT
GOWN STRL REUS W/ TWL LRG LVL3 (GOWN DISPOSABLE) ×4 IMPLANT
GOWN STRL REUS W/ TWL XL LVL3 (GOWN DISPOSABLE) ×2 IMPLANT
GOWN STRL REUS W/TWL LRG LVL3 (GOWN DISPOSABLE) ×2
GOWN STRL REUS W/TWL XL LVL3 (GOWN DISPOSABLE) ×1
HANDPIECE INTERPULSE COAX TIP (DISPOSABLE)
IMMOBILIZER KNEE 20 (SOFTGOODS) ×1 IMPLANT
IMMOBILIZER KNEE 20 THIGH 36 (SOFTGOODS) IMPLANT
KIT BASIN OR (CUSTOM PROCEDURE TRAY) ×2 IMPLANT
KIT TURNOVER KIT A (KITS) IMPLANT
MANIFOLD NEPTUNE II (INSTRUMENTS) ×2 IMPLANT
NS IRRIG 1000ML POUR BTL (IV SOLUTION) ×4 IMPLANT
PACK ORTHO EXTREMITY (CUSTOM PROCEDURE TRAY) ×2 IMPLANT
PADDING CAST COTTON 6X4 STRL (CAST SUPPLIES) ×2 IMPLANT
PENCIL SMOKE EVACUATOR (MISCELLANEOUS) IMPLANT
SET HNDPC FAN SPRY TIP SCT (DISPOSABLE) IMPLANT
SUT ETHILON 3 0 PS 1 (SUTURE) IMPLANT
SUT MNCRL AB 4-0 PS2 18 (SUTURE) ×2 IMPLANT
SUT VIC AB 0 CT1 36 (SUTURE) IMPLANT
SUT VIC AB 1 CT1 27 (SUTURE) ×2
SUT VIC AB 1 CT1 27XBRD ANBCTR (SUTURE) IMPLANT
SUT VIC AB 2-0 CT1 27 (SUTURE) ×2
SUT VIC AB 2-0 CT1 TAPERPNT 27 (SUTURE) IMPLANT
SYR CONTROL 10ML LL (SYRINGE) IMPLANT
TOWEL OR 17X26 10 PK STRL BLUE (TOWEL DISPOSABLE) ×2 IMPLANT
TOWEL OR NON WOVEN STRL DISP B (DISPOSABLE) ×2 IMPLANT
UNDERPAD 30X36 HEAVY ABSORB (UNDERPADS AND DIAPERS) ×4 IMPLANT
WATER STERILE IRR 1000ML POUR (IV SOLUTION) ×2 IMPLANT

## 2022-12-11 NOTE — ED Notes (Signed)
ED Provider at bedside. 

## 2022-12-11 NOTE — ED Provider Notes (Signed)
[facility]-EMERGENCY DEPT Provider Note  CSN: [id number] Arrival date & time: 12/11/22 0418  Chief Complaint(s) Fall  HPI [facility] is a 66 y.o. female with PMH COPD, CAD, previous PE on Xarelto who presents emergency department for evaluation of a fall and left knee pain.  Patient is approximately 5 weeks postop left TKA by Dr. Shelle Iron of orthopedics (11/04/22) and stood up quickly today.  Upon standing she heard a pop and had significant worsening pain at the left knee and had difficulty walking on the knee.  Patient apparently went to an outside emergency department at [facility] where she received an x-ray that was negative and she was ultimately discharged home with outpatient orthopedic follow-up.  When at home, her pain worsened and she attempted to ambulate on the leg, causing her to fall onto the knee.  She arrives today with an open knee wound along the incisional scar and an inability to extend the knee.  Patient states she took oxycodone and Flexeril prior to arrival.   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   DOE (dyspnea on exertion) 04/30/2022   Former smoker 04/30/2022   Upper airway cough syndrome 04/30/2022   Lung nodule 03/24/2022   Thyroid nodule 03/24/2022   Lobar pneumonia (HCC) 03/24/2022   Acute respiratory failure with hypoxia (HCC) 03/23/2022   Opioid dependence (HCC) 03/23/2022    Fibromyalgia 03/23/2022   OSA (obstructive sleep apnea) 03/23/2022   History of pulmonary embolus (PE) 03/23/2022   COPD with acute exacerbation (HCC) 03/22/2022   Chest pain 03/22/2022   GERD (gastroesophageal reflux disease) 03/22/2022   Home Medication(s) Prior to Admission medications   Medication Sig Start Date End Date Taking? Authorizing Provider  albuterol (VENTOLIN HFA) 108 (90 Base) MCG/ACT inhaler Inhale 2 puffs into the lungs every 6 (six) hours as needed for wheezing or shortness of breath. 05/12/21   [provider]  ALPRAZolam Prudy Feeler) 1 MG tablet Take 1 mg by mouth 4 (four) times daily as needed for anxiety.    [provider]  amoxicillin (AMOXIL) 500 MG capsule Take 1 capsule (500 mg total) by mouth 3 (three) times daily. 11/10/22   Jene Every, MD  b complex vitamins capsule Take 1 capsule by mouth daily.    [provider]  benzonatate (TESSALON) 100 MG capsule Take 1 capsule (100 mg total) by mouth every 8 (eight) hours. Patient not taking: Reported on 11/20/2022 07/05/22   Arthor Captain, PA-C  Cholecalciferol (VITAMIN D3) 1.25 MG (50000 UT) CAPS Take 50,000 Units by mouth every Wednesday. 02/16/22   [provider]  diphenhydrAMINE (BENADRYL) 25 mg capsule Take 25 mg by mouth daily as needed for allergies.    [provider]  docusate sodium (COLACE) 100 MG capsule Take 1 capsule (100 mg total) by mouth 2 (two) times daily  as needed for mild constipation. 11/04/22   Jene Every, MD  DULoxetine (CYMBALTA) 60 MG capsule Take 60 mg by mouth at bedtime. 09/14/18   [provider]  famotidine (PEPCID) 20 MG tablet One after supper Patient taking differently: Take 20 mg by mouth daily. One after supper 04/30/22   Nyoka Cowden, MD  Ferrous Fumarate-Folic Acid 324-1 MG TABS Take 1 tablet by mouth daily. Hematinic 02/29/20   [provider]  furosemide (LASIX) 20 MG tablet Take 1 tablet (20 mg total) by mouth  daily. Patient not taking: Reported on 11/20/2022 08/27/22   Teressa Lower, PA-C  gabapentin (NEURONTIN) 400 MG capsule Take 400 mg by mouth 3 (three) times daily. 08/21/22   [provider]  Meclizine HCl 25 MG CHEW Chew 25 mg by mouth 3 (three) times daily as needed (dizziness).    [provider]  melatonin 3 MG TABS tablet Take 3 mg by mouth at bedtime.    [provider]  Multiple Vitamins-Minerals (MULTIVITAMIN GUMMIES ADULT PO) Take 1 tablet by mouth daily. With Zinc and vitamin C    [provider]  naloxone (NARCAN) nasal spray 4 mg/0.1 mL Place 1 spray into the nose once as needed (accidental over dose). 10/16/21   [provider]  oxyCODONE (OXY IR/ROXICODONE) 5 MG immediate release tablet Take 1 tablet (5 mg total) by mouth every 4 (four) hours as needed for severe pain. 11/10/22   Jene Every, MD  pantoprazole (PROTONIX) 40 MG tablet Take 40 mg by mouth every morning. 06/30/16   [provider]  polyethylene glycol (MIRALAX / GLYCOLAX) 17 g packet Take 17 g by mouth daily. 11/04/22   Jene Every, MD  XARELTO 20 MG TABS tablet Take 20 mg by mouth daily. 08/21/21   [provider]                                                                                                                                    Past Surgical History Past Surgical History:  Procedure Laterality Date   ABDOMINAL HYSTERECTOMY     BIOPSY THYROID     HERNIA REPAIR     IVC FILTER INSERTION N/A 09/28/2022   Procedure: IVC FILTER INSERTION;  Surgeon: Maeola Harman, MD;  Location: [facility] INVASIVE CV LAB;  Service: Cardiovascular;  Laterality: N/A;   KIDNEY STONE SURGERY     LAPAROSCOPIC NISSEN FUNDOPLICATION     PAROTIDECTOMY     TONSILLECTOMY     TOTAL KNEE ARTHROPLASTY Left 11/04/2022   Procedure: TOTAL KNEE ARTHROPLASTY;  Surgeon: Jene Every, MD;  Location: [facility] ORS;  Service: Orthopedics;  Laterality: Left;   Family  History Family History  Problem Relation Age of Onset   Hypertension Mother     Social History Social History   Tobacco Use   Smoking status: Former    Packs/day: 0.50    Years: 10.00  Total pack years: 5.00    Types: Cigarettes    Quit date: 02/12/2021    Years since quitting: 1.8   Smokeless tobacco: Never  Vaping Use   Vaping Use: Never used  Substance Use Topics   Alcohol use: Not Currently   Drug use: Not Currently   Allergies Lyrica [pregabalin], Belbuca [buprenorphine hcl], Compazine [prochlorperazine], Droperidol, and Ergotamine-caffeine  Review of Systems Review of Systems  Musculoskeletal:  Positive for arthralgias and joint swelling.  Skin:  Positive for wound.    Physical Exam Vital Signs  I have reviewed the triage vital signs BP 107/69   Pulse 72   Temp (!) 97.3 F (36.3 C) (Oral)   Resp 14   SpO2 99%   Physical Exam Vitals and nursing note reviewed.  Constitutional:      General: She is not in acute distress.    Appearance: She is well-developed.  HENT:     Head: Normocephalic and atraumatic.  Eyes:     Conjunctiva/sclera: Conjunctivae normal.  Cardiovascular:     Rate and Rhythm: Normal rate and regular rhythm.     Heart sounds: No murmur heard. Pulmonary:     Effort: Pulmonary effort is normal. No respiratory distress.     Breath sounds: Normal breath sounds.  Abdominal:     Palpations: Abdomen is soft.     Tenderness: There is no abdominal tenderness.  Musculoskeletal:        General: Swelling, tenderness, deformity and signs of injury present.     Cervical back: Neck supple.  Skin:    General: Skin is warm and dry.     Capillary Refill: Capillary refill takes less than 2 seconds.     Findings: Lesion present.  Neurological:     Mental Status: She is alert.  Psychiatric:        Mood and Affect: Mood normal.     ED Results and Treatments Labs (all labs ordered are listed, but only abnormal results are displayed) Labs  Reviewed  COMPREHENSIVE METABOLIC PANEL - Abnormal; Notable for the following components:      Result Value   Glucose, Bld 117 (*)    Total Protein 6.4 (*)    Albumin 2.8 (*)    All other components within normal limits  CBC WITH DIFFERENTIAL/PLATELET - Abnormal; Notable for the following components:   RBC 3.65 (*)    Hemoglobin 11.4 (*)    MCV 103.0 (*)    All other components within normal limits  SEDIMENTATION RATE  C-REACTIVE PROTEIN                                                                                                                          Radiology CT CHEST WO CONTRAST  Result Date: 12/11/2022 CLINICAL DATA:  66 year old female with recent left knee surgery. Sharp pain "heard a pop" and fell. EXAM: CT CHEST WITHOUT CONTRAST TECHNIQUE: Multidetector CT imaging of the chest was performed following the standard protocol without IV  contrast. RADIATION DOSE REDUCTION: This exam was performed according to the departmental dose-optimization program which includes automated exposure control, adjustment of the mA and/or kV according to patient size and/or use of iterative reconstruction technique. COMPARISON:  Cervical spine CT today.  Chest CT 03/23/2022. FINDINGS: Cardiovascular: Calcified aortic atherosclerosis. Stable cardiac size at the upper limits of normal. No pericardial effusion. Vascular patency is not evaluated in the absence of IV contrast. Mediastinum/Nodes: Moderate to large gastric hiatal hernia with surrounding surgical clips, staple line is stable since April. No superimposed mediastinal mass or lymphadenopathy. Lungs/Pleura: Lower lung volumes compared to April with some atelectatic changes to the major airways which remain patent. Streaky bilateral peribronchial mostly sub solid opacity predominantly in the upper lobes (series 4, image 46) with underlying mosaic attenuation. Lower lobe mild and more typical dependent atelectasis appearance. No pleural effusion. No  consolidation. Middle lobes appear negative. Upper Abdomen: Chronic postoperative changes about the distal intra-abdominal stomach also redemonstrated. Previous ventral abdominal hernia repair with mesh only minimally visible today over the left liver contour. Visible noncontrast liver, spleen, pancreas, left adrenal gland, and splenic flexure appears stable and negative. Musculoskeletal: Osteopenia. Motion artifact at the mid sternum and affecting bilateral rib detail. No convincing No acute osseous abnormality identified. IMPRESSION: 1. Mild motion artifact.  No acute traumatic injury identified. 2. Lower lung volumes with nonspecific streaky bilateral upper lobe opacity. But favor a combination of gas trapping and atelectasis rather than acute viral/atypical respiratory infection. No pleural effusion or other acute pulmonary abnormality. 3. Chronic moderate to large gastric hiatal hernia with postoperative changes surrounding the stomach. 4.  Aortic Atherosclerosis (ICD10-I70.0). Electronically Signed   By: Odessa Fleming M.D.   On: 12/11/2022 06:11   CT Cervical Spine Wo Contrast  Result Date: 12/11/2022 CLINICAL DATA:  66 year old female with recent left knee surgery. Sharp pain "heard a pop" and fell. EXAM: CT CERVICAL SPINE WITHOUT CONTRAST TECHNIQUE: Multidetector CT imaging of the cervical spine was performed without intravenous contrast. Multiplanar CT image reconstructions were also generated. RADIATION DOSE REDUCTION: This exam was performed according to the departmental dose-optimization program which includes automated exposure control, adjustment of the mA and/or kV according to patient size and/or use of iterative reconstruction technique. COMPARISON:  Cervical spine CT 11/20/2022.  Head CT today. FINDINGS: Alignment: Stable exaggerated cervical lordosis. Cervicothoracic junction alignment is within normal limits. Bilateral posterior element alignment is within normal limits. Skull base and vertebrae:  Osteopenia. Visualized skull base is intact. No atlanto-occipital dissociation. C1 and C2 appear intact and aligned. Intermittent mild motion artifact. No acute osseous abnormality identified. Soft tissues and spinal canal: No prevertebral fluid or swelling. No visible canal hematoma. Negative visible noncontrast neck soft tissues. Disc levels:  Stable mild for age cervical spine degeneration. Upper chest: Dedicated chest CT today is reported separately. IMPRESSION: 1. Mild motion artifact. No acute traumatic injury identified in the cervical spine. 2. Osteopenia.  Mild for age cervical spine degeneration. Electronically Signed   By: Odessa Fleming M.D.   On: 12/11/2022 06:06   CT HEAD WO CONTRAST ( )  Result Date: 12/11/2022 CLINICAL DATA:  66 year old female with recent left knee surgery. Sharp pain "heard a pop" and fell. EXAM: CT HEAD WITHOUT CONTRAST TECHNIQUE: Contiguous axial images were obtained from the base of the skull through the vertex without intravenous contrast. RADIATION DOSE REDUCTION: This exam was performed according to the departmental dose-optimization program which includes automated exposure control, adjustment of the mA and/or kV according to patient size and/or use  of iterative reconstruction technique. COMPARISON:  Head CT 11/20/2022. FINDINGS: Brain: Cerebral volume is within normal limits for age. No midline shift, ventriculomegaly, mass effect, evidence of mass lesion, intracranial hemorrhage or evidence of cortically based acute infarction. Gray-white matter differentiation is within normal limits throughout the brain. Faint asymmetric left basal ganglia vascular calcifications are stable. Vascular: No suspicious intracranial vascular hyperdensity. Skull: Stable osteopenia.  No acute osseous abnormality identified. Sinuses/Orbits: Visualized paranasal sinuses and mastoids are clear. Other: No orbit or scalp soft tissue injury identified. IMPRESSION: No acute traumatic injury  identified. Stable and normal for age non contrast CT appearance of the brain. Electronically Signed   By: Odessa Fleming M.D.   On: 12/11/2022 05:59    Pertinent labs & imaging results that were available during my care of the patient were reviewed by me and considered in my medical decision making (see MDM for details).  Medications Ordered in ED Medications  acetaminophen (TYLENOL) tablet 1,000 mg (1,000 mg Oral Not Given 12/11/22 0525)  ceFAZolin (ANCEF) IVPB 2g/100 mL premix (has no administration in time range)                                                                                                                                     Procedures Procedures  (including critical care time)  Medical Decision Making / ED Course   This patient presents to the ED for concern of knee pain, wound, this involves an extensive number of treatment options, and is a complaint that carries with it a high risk of complications and morbidity.  The differential diagnosis includes patellar rupture, patellar dislocation, postop complication, postop infection, hematoma  MDM: Patient seen emergency room for evaluation of knee pain with a wound.  Physical exam with a 4 cm open laceration along the surgical incision site of the left TKA with bleeding controlled.  Patient has an inability to extend the knee.  CT head and C-spine unremarkable.  CT chest obtained due to bruising over the chest wall which was reassuringly negative for acute fracture.  CT knee was performed as the x-ray today was reportedly negative that shows a concern for patellar rupture and due to recent TKA and concern for open patellar rupture today, I consulted the emergent orthopedic surgeon on-call Dr. Lequita Halt who is recommending that the patient remain n.p.o., receive Ancef and will likely require surgical washout this morning.  Patient then admitted to medicine.   Additional history obtained: -Additional history obtained from husband   -External records from outside source obtained and reviewed including: Chart review including previous notes, labs, imaging, consultation notes   Lab Tests: -I ordered, reviewed, and interpreted labs.   The pertinent results include:   Labs Reviewed  COMPREHENSIVE METABOLIC PANEL - Abnormal; Notable for the following components:      Result Value   Glucose, Bld 117 (*)    Total Protein 6.4 (*)    Albumin  2.8 (*)    All other components within normal limits  CBC WITH DIFFERENTIAL/PLATELET - Abnormal; Notable for the following components:   RBC 3.65 (*)    Hemoglobin 11.4 (*)    MCV 103.0 (*)    All other components within normal limits  SEDIMENTATION RATE  C-REACTIVE PROTEIN     Imaging Studies ordered: I ordered imaging studies including CT head, C-spine, chest, knee I independently visualized and interpreted imaging. I agree with the radiologist interpretation   Medicines ordered and prescription drug management: Meds ordered this encounter  Medications   acetaminophen (TYLENOL) tablet 1,000 mg   ceFAZolin (ANCEF) IVPB 2g/100 mL premix    Order Specific Question:   Antibiotic Indication:    Answer:   Surgical Prophylaxis    -I have reviewed the patients home medicines and have made adjustments as needed  Critical interventions none  Consultations Obtained: I requested consultation with the orthopedic surgeon on-call Dr. Lequita Halt,  and discussed lab and imaging findings as well as pertinent plan - they recommend: N.p.o., Ancef, medical admit, surgery today   Cardiac Monitoring: The patient was maintained on a cardiac monitor.  I personally viewed and interpreted the cardiac monitored which showed an underlying rhythm of: NSR  Social Determinants of Health:  Factors impacting patients care include: none   Reevaluation: After the interventions noted above, I reevaluated the patient and found that they have :stayed the same  Co morbidities that complicate the  patient evaluation  Past Medical History:  Diagnosis Date   Anxiety    CAD (coronary artery disease)    COPD (chronic obstructive pulmonary disease) (HCC)    Depression    Dyspnea    Fibromyalgia    GERD (gastroesophageal reflux disease)    Hiatal hernia    History of kidney stones    Migraine headache    Multinodular goiter    Nephrolithiasis    Pulmonary embolism (HCC) 1996   Sleep apnea    Thyroid nodule       Dispostion: I considered admission for this patient, and due to open patellar rupture requiring surgery, patient require hospital admission     Final Clinical Impression(s) / ED Diagnoses Final diagnoses:  Avulsion of left patellar tendon, initial encounter     @PCDICTATION @    , MD 12/11/22 [phone]

## 2022-12-11 NOTE — ED Triage Notes (Addendum)
Pt reporting that she was getting up off the couch, she felt a sharp pain and heard a loud "pop" in her left knee and she fell to the ground. She is on blood thinners-xarelto. She is unsure if she hit her head, she does have a headache. Denies LOC. She has bruising over the right breast area. C/o headache and pain in her knee. She denies fevers, says she did have some redness in her knee today. She says took a muscle relaxer and an oxycodone after the fall and prior to being transported by EMS

## 2022-12-11 NOTE — ED Triage Notes (Signed)
Pt arrives via [facility] from home, per report, the pt had left knee surgery on the 22nd, sustained a fall at home tonight, fell onto her left knee. The incision has opened up and bleeding. Pt took a pain pill at home prior to calling EMS. En route vitals 124/83, hr 91, 95%.

## 2022-12-11 NOTE — Assessment & Plan Note (Signed)
Temporarily holding Xarelto in preparation for surgical intervention Patient is slated to have IVC filter removal in January

## 2022-12-11 NOTE — Anesthesia Preprocedure Evaluation (Signed)
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 Abnormal R-wave progression, early transition. LVH. Anterior Q waves, possibly due to LVH     CV: Echo 02/14/21 (care everywhere):  1. Left ventricle is normal in size and function with EF 60-65%.  Normal LV wall thickness.  Normal diastolic function.  2.  Right ventricle is normal in size and function.  3.  No valvular abnormalities.  4.  Normal right ventricular systolic pressure.  No evidence of pulmonary hypertension.  5. No pericardial effusion.     Neuro/Psych  Headaches PSYCHIATRIC DISORDERS Anxiety Depression       GI/Hepatic Neg liver ROS, hiatal hernia,GERD  Medicated and Poorly Controlled,,  Endo/Other  negative endocrine ROS    Renal/GU negative Renal ROS  negative genitourinary   Musculoskeletal  (+)  Fibromyalgia -, narcotic dependent  Abdominal   Peds  Hematology  (+) Blood dyscrasia (on xarelto, last dose 11/19 at 9pm)   Anesthesia Other Findings   Reproductive/Obstetrics                             Anesthesia Physical Anesthesia Plan  ASA: 3  Anesthesia Plan: General   Post-op Pain Management:    Induction: Intravenous  PONV Risk Score and Plan: 3 and Ondansetron, Dexamethasone and Midazolam  Airway Management Planned: LMA  Additional Equipment:   Intra-op Plan:   Post-operative Plan: Extubation in OR  Informed Consent: I  have reviewed the patients History and Physical, chart, labs and discussed the procedure including the risks, benefits and alternatives for the proposed anesthesia with the patient or authorized representative who has indicated his/her understanding and acceptance.     Dental advisory given  Plan Discussed with:   Anesthesia Plan Comments:        Anesthesia Quick Evaluation

## 2022-12-11 NOTE — Assessment & Plan Note (Signed)
·   Please see assessment and plan above °

## 2022-12-11 NOTE — Progress Notes (Signed)
Ultrasound to bilat arms posteriorly and anteriorly only possible attempt was Lt lower FA - IV access # 22, 1.75 Lt lower FA. ED MD & paramedic aware that if patient needs other access it will need to be a central line. Other veins either too small or too deep to attempt.

## 2022-12-11 NOTE — Anesthesia Procedure Notes (Signed)
Procedure Name: LMA Insertion Date/Time: 12/11/2022 4:36 PM  Performed by: Ludwig Lean, CRNAPre-anesthesia Checklist: Patient identified, Emergency Drugs available, Suction available and Patient being monitored Patient Re-evaluated:Patient Re-evaluated prior to induction Oxygen Delivery Method: Circle system utilized Preoxygenation: Pre-oxygenation with 100% oxygen Induction Type: IV induction Ventilation: Mask ventilation without difficulty LMA: LMA with gastric port inserted LMA Size: 4.0 Number of attempts: 1 Placement Confirmation: positive ETCO2 and breath sounds checked- equal and bilateral Tube secured with: Tape Dental Injury: Teeth and Oropharynx as per pre-operative assessment

## 2022-12-11 NOTE — Consult Note (Signed)
ORTHOPAEDIC CONSULTATION  REQUESTING PHYSICIAN: Shalhoub, Deno Lunger, MD  PCP:  Clinic, [facility]  Chief Complaint: Left total knee wound dehiscence  HPI: Norma Anthony is a 66 y.o. female who complains of left knee pain and operative/bleeding wound at her left knee following a fall at home yesterday evening.  She presented to [facility] early this morning via EMS.  She is status post left total knee arthroplasty by Dr. Paula Libra about 5 weeks ago.  She presented just this morning with the above issue.  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  Otherwise has been on DVT prophylaxis postoperatively.  Currently on Xarelto.  She endorses perhaps feeling a pop on the day before yesterday.  However no significant issue with ambulation at that time.  And then had a subsequent bend that ultimately resulted in the kneecap shifting over to the left and having an open wound.  Past Medical History:  Diagnosis Date   Anxiety    CAD (coronary artery disease)    COPD (chronic obstructive pulmonary disease) (HCC)    Depression    Dyspnea    Fibromyalgia    GERD (gastroesophageal reflux disease)    Hiatal hernia    History of kidney stones    Migraine headache    Multinodular goiter    Nephrolithiasis    Pulmonary embolism (HCC) 1996   Sleep apnea    Thyroid nodule    Past Surgical History:  Procedure Laterality Date   ABDOMINAL HYSTERECTOMY     BIOPSY THYROID     HERNIA REPAIR     IVC FILTER INSERTION N/A 09/28/2022   Procedure: IVC FILTER INSERTION;  Surgeon: Maeola Harman, MD;  Location: [facility] INVASIVE CV LAB;  Service: Cardiovascular;  Laterality: N/A;   KIDNEY STONE SURGERY     LAPAROSCOPIC NISSEN FUNDOPLICATION     PAROTIDECTOMY     TONSILLECTOMY     TOTAL KNEE ARTHROPLASTY Left 11/04/2022   Procedure: TOTAL KNEE ARTHROPLASTY;  Surgeon: Jene Every, MD;  Location: [facility] ORS;  Service: Orthopedics;  Laterality: Left;    Social History   Socioeconomic History   Marital status: Married    Spouse name: Not on file   Number of children: Not on file   Years of education: Not on file   Highest education level: Not on file  Occupational History   Not on file  Tobacco Use   Smoking status: Former    Packs/day: 0.50    Years: 10.00    Total pack years: 5.00    Types: Cigarettes    Quit date: 02/12/2021    Years since quitting: 1.8   Smokeless tobacco: Never  Vaping Use   Vaping Use: Never used  Substance and Sexual Activity   Alcohol use: Not Currently   Drug use: Not Currently   Sexual activity: Not on file  Other Topics Concern   Not on file  Social History Narrative   Not on file   Social Determinants of Health   Financial Resource Strain: Not on file  Food Insecurity: No Food Insecurity (12/11/2022)   Hunger Vital Sign    Worried About Running Out of Food in the Last Year: Never true    Ran Out of Food in the Last Year: Never true  Transportation Needs: No Transportation Needs (12/11/2022)   PRAPARE - Administrator, Civil Service (Medical): No    Lack of Transportation (Non-Medical): No  Physical Activity: Not on file  Stress: Not on file  Social Connections: Not on file   Family History  Problem Relation Age of Onset   Hypertension Mother    Allergies  Allergen Reactions   Lyrica [Pregabalin] Other (See Comments)    Paralysis present (finding)- not tolerated Pt stated she could not move her body but she could think      Belbuca [Buprenorphine Hcl] Other (See Comments)    " Horrible headache"    Compazine [Prochlorperazine] Anxiety   Droperidol Anxiety   Ergotamine-Caffeine Anxiety and Hypertension    Increased systolic arterial pressure (finding)    Prior to Admission medications   Medication Sig Start Date End Date Taking? Authorizing Provider  albuterol (VENTOLIN HFA) 108 (90 Base) MCG/ACT inhaler Inhale 2 puffs into the lungs every 6 (six) hours as  needed for wheezing or shortness of breath. 05/12/21  Yes [provider]  ALPRAZolam Prudy Feeler) 1 MG tablet Take 1-4 mg by mouth See admin instructions. Take 3-4 mg by mouth at bedtime as needed for sleep or anxiety and an additional 1 mg once a day as needed for anxiety   Yes [provider]  aspirin EC 81 MG tablet Take 81 mg by mouth daily. Swallow whole.   Yes [provider]  b complex vitamins capsule Take 1 capsule by mouth daily.   Yes [provider]  Cholecalciferol (VITAMIN D3) 1.25 MG (50000 UT) CAPS Take 50,000 Units by mouth every Wednesday. 02/16/22  Yes [provider]  clindamycin-benzoyl peroxide (BENZACLIN) gel Apply 1 Application topically 2 (two) times daily as needed (for acne breakouts).   Yes [provider]  clotrimazole-betamethasone (LOTRISONE) cream Apply 1 Application topically 2 (two) times daily as needed (for irritation under the breasts or abdominal folds).   Yes [provider]  DULoxetine (CYMBALTA) 60 MG capsule Take 60 mg by mouth at bedtime. 09/14/18  Yes [provider]  famotidine (PEPCID) 20 MG tablet One after supper Patient taking differently: Take 20 mg by mouth at bedtime. 04/30/22  Yes Nyoka Cowden, MD  Ferrous Fumarate-Folic Acid 324-1 MG TABS Take 1 tablet by mouth daily. Hematinic 02/29/20  Yes [provider]  gabapentin (NEURONTIN) 400 MG capsule Take 400 mg by mouth 3 (three) times daily. 08/21/22  Yes [provider]  Multiple Vitamins-Minerals (MULTIVITAMIN GUMMIES ADULT PO) Take 1 tablet by mouth daily. With Zinc and vitamin C   Yes [provider]  naloxone (NARCAN) nasal spray 4 mg/0.1 mL Place 1 spray into the nose once as needed (accidental over dose). 10/16/21  Yes [provider]  Oxycodone HCl 10 MG TABS Take 10 mg by mouth every 8 (eight) hours.   Yes [provider]  pantoprazole (PROTONIX) 40 MG tablet Take 40 mg by mouth daily  before breakfast. 06/30/16  Yes [provider]  polyethylene glycol (MIRALAX / GLYCOLAX) 17 g packet Take 17 g by mouth daily. Patient taking differently: Take 17 g by mouth daily as needed for mild constipation. 11/04/22  Yes Jene Every, MD  promethazine (PHENERGAN) 25 MG tablet Take 25 mg by mouth every 6 (six) hours as needed for nausea or vomiting.   Yes [provider]  tiZANidine (ZANAFLEX) 2 MG tablet Take 2 mg by mouth every 12 (twelve) hours as needed for muscle spasms.   Yes [provider]  XARELTO 20 MG TABS tablet Take 20 mg by mouth at bedtime. 08/21/21  Yes [provider]  benzonatate (  TESSALON) 100 MG capsule Take 1 capsule (100 mg total) by mouth every 8 (eight) hours. Patient not taking: Reported on 12/11/2022 07/05/22   Arthor Captain, PA-C  docusate sodium (COLACE) 100 MG capsule Take 1 capsule (100 mg total) by mouth 2 (two) times daily as needed for mild constipation. Patient not taking: Reported on 12/11/2022 11/04/22   Jene Every, MD  furosemide (LASIX) 20 MG tablet Take 1 tablet (20 mg total) by mouth daily. Patient not taking: Reported on 12/11/2022 08/27/22   Teressa Lower, PA-C  melatonin 3 MG TABS tablet Take 3 mg by mouth at bedtime.    [provider]  oxyCODONE (OXY IR/ROXICODONE) 5 MG immediate release tablet Take 1 tablet (5 mg total) by mouth every 4 (four) hours as needed for severe pain. Patient not taking: Reported on 12/11/2022 11/10/22   Jene Every, MD   CT Knee Left Wo Contrast  Result Date: 12/11/2022 CLINICAL DATA:  Left knee pain, left knee surgery 11/04/2022. EXAM: CT OF THE LEFT KNEE WITHOUT CONTRAST TECHNIQUE: Multidetector CT imaging of the left knee was performed according to the standard protocol. Multiplanar CT image reconstructions were also generated. RADIATION DOSE REDUCTION: This exam was performed according to the departmental dose-optimization program which includes automated  exposure control, adjustment of the mA and/or kV according to patient size and/or use of iterative reconstruction technique. COMPARISON:  None Available. FINDINGS: Bones/Joint/Cartilage Left total knee arthroplasty. Beam hardening artifact resulting from the orthopedic hardware obscures the adjacent soft tissue and osseous structures. No acute fracture. Lateral dislocation of the patella which continues to be perched on the lateral trochlea. Large hemarthrosis. Ligaments Ligaments are suboptimally evaluated by CT. Muscles and Tendons Patellar tendon and quadriceps tendon are thickened and ill-defined and difficult to determine whether these are postsurgical changes versus an underlying tear. Soft tissue No fluid collection or hematoma. No soft tissue mass. Postsurgical changes in the anterior knee soft tissues. IMPRESSION: 1. Left total knee arthroplasty. Beam hardening artifact resulting from the orthopedic hardware obscures the adjacent soft tissue and osseous structures. 2. Lateral dislocation of the patella which continues to be perched on the lateral trochlea. 3. Patellar tendon and quadriceps tendon are thickened and ill-defined and difficult to determine whether these are postsurgical changes versus an underlying tear. Further characterization with an MRI of the knee with a mars protocol may be helpful. There is too much artifact resulting from the orthopedic hardware, an ultrasound of the knee for evaluation of the quadriceps tendon and patellar tendon would be recommended. 4. Large hemarthrosis. Electronically Signed   By: Elige Ko M.D.   On: 12/11/2022 08:11   CT CHEST WO CONTRAST  Result Date: 12/11/2022 CLINICAL DATA:  66 year old female with recent left knee surgery. Sharp pain "heard a pop" and fell. EXAM: CT CHEST WITHOUT CONTRAST TECHNIQUE: Multidetector CT imaging of the chest was performed following the standard protocol without IV contrast. RADIATION DOSE REDUCTION: This exam was  performed according to the departmental dose-optimization program which includes automated exposure control, adjustment of the mA and/or kV according to patient size and/or use of iterative reconstruction technique. COMPARISON:  Cervical spine CT today.  Chest CT 03/23/2022. FINDINGS: Cardiovascular: Calcified aortic atherosclerosis. Stable cardiac size at the upper limits of normal. No pericardial effusion. Vascular patency is not evaluated in the absence of IV contrast. Mediastinum/Nodes: Moderate to large gastric hiatal hernia with surrounding surgical clips, staple line is stable since April. No superimposed mediastinal mass or lymphadenopathy. Lungs/Pleura: Lower lung volumes compared to  April with some atelectatic changes to the major airways which remain patent. Streaky bilateral peribronchial mostly sub solid opacity predominantly in the upper lobes (series 4, image 46) with underlying mosaic attenuation. Lower lobe mild and more typical dependent atelectasis appearance. No pleural effusion. No consolidation. Middle lobes appear negative. Upper Abdomen: Chronic postoperative changes about the distal intra-abdominal stomach also redemonstrated. Previous ventral abdominal hernia repair with mesh only minimally visible today over the left liver contour. Visible noncontrast liver, spleen, pancreas, left adrenal gland, and splenic flexure appears stable and negative. Musculoskeletal: Osteopenia. Motion artifact at the mid sternum and affecting bilateral rib detail. No convincing No acute osseous abnormality identified. IMPRESSION: 1. Mild motion artifact.  No acute traumatic injury identified. 2. Lower lung volumes with nonspecific streaky bilateral upper lobe opacity. But favor a combination of gas trapping and atelectasis rather than acute viral/atypical respiratory infection. No pleural effusion or other acute pulmonary abnormality. 3. Chronic moderate to large gastric hiatal hernia with postoperative changes  surrounding the stomach. 4.  Aortic Atherosclerosis (ICD10-I70.0). Electronically Signed   By: H  Hall M.D.   On: 12/11/2022 06:11   CT Cervical Spine Wo Contrast  Result Date: 12/11/2022 CLINICAL DATA:  66 year old female with recent left knee surgery. Sharp pain "heard a pop" and fell. EXAM: CT CERVICAL SPINE WITHOUT CONTRAST TECHNIQUE: Multidetector CT imaging of the cervical spine was performed without intravenous contrast. Multiplanar CT image reconstructions were also generated. RADIATION DOSE REDUCTION: This exam was performed according to the departmental dose-optimization program which includes automated exposure control, adjustment of the mA and/or kV according to patient size and/or use of iterative reconstruction technique. COMPARISON:  Cervical spine CT 11/20/2022.  Head CT today. FINDINGS: Alignment: Stable exaggerated cervical lordosis. Cervicothoracic junction alignment is within normal limits. Bilateral posterior element alignment is within normal limits. Skull base and vertebrae: Osteopenia. Visualized skull base is intact. No atlanto-occipital dissociation. C1 and C2 appear intact and aligned. Intermittent mild motion artifact. No acute osseous abnormality identified. Soft tissues and spinal canal: No prevertebral fluid or swelling. No visible canal hematoma. Negative visible noncontrast neck soft tissues. Disc levels:  Stable mild for age cervical spine degeneration. Upper chest: Dedicated chest CT today is reported separately. IMPRESSION: 1. Mild motion artifact. No acute traumatic injury identified in the cervical spine. 2. Osteopenia.  Mild for age cervical spine degeneration. Electronically Signed   By: H  Hall M.D.   On: 12/11/2022 06:06   CT HEAD WO CONTRAST (<MEASUREMEN340W40.Ca<MEASUREMENT40.Ca<MEASUREMEN40.Ca 602No40.Ca 740.Ca<MEASUREMEN40.Ca 40.Ca<MEASUREMEN40.Ca 480K40.Ca<MEASUREMENT40.CaGrenadainarowInc: 12/11/2022 CLINICAL DATA:  66 year old female with recent left knee surgery. Sharp pain "heard a pop" and fell. EXAM: CT HEAD WITHOUT CONTRAST TECHNIQUE: Contiguous axial images were obtained from the base  of the skull through the vertex without intravenous contrast. RADIATION DOSE REDUCTION: This exam was performed according to the departmental dose-optimization program which includes automated exposure control, adjustment of the mA and/or kV according to patient size and/or use of iterative reconstruction technique. COMPARISON:  Head CT 11/20/2022. FINDINGS: Brain: Cerebral volume is within normal limits for age. No midline shift, ventriculomegaly, mass effect, evidence of mass lesion, intracranial hemorrhage or evidence of cortically based acute infarction. Gray-white matter differentiation is within normal limits throughout the brain. Faint asymmetric left basal ganglia vascular calcifications are stable. Vascular: No suspicious intracranial vascular hyperdensity. Skull: Stable osteopenia.  No acute osseous abnormality identified. Sinuses/Orbits: Visualized paranasal sinuses and mastoids are clear. Other: No orbit or scalp soft tissue injury identified. IMPRESSION: No acute traumatic injury identified. Stable and normal for age non contrast  CT appearance of the brain. Electronically Signed   By: Odessa Fleming M.D.   On: 12/11/2022 05:59    Positive ROS: All other systems have been reviewed and were otherwise negative with the exception of those mentioned in the HPI and as above.  Physical Exam: General: Alert, no acute distress Cardiovascular: No pedal edema Respiratory: No cyanosis, no use of accessory musculature GI: No organomegaly, abdomen is soft and non-tender Skin: No lesions in the area of chief complaint Neurologic: Sensation intact distally Psychiatric: Patient is competent for consent with normal mood and affect Lymphatic: No axillary or cervical lymphadenopathy  MUSCULOSKELETAL: Left knee exam:  She has a partially open anterior incision about the knee with some bleeding noted there.  This appears hemostatic.  Distally calf soft nontender with no signs of DVT.  Neurovascular intact otherwise.   Good 2+ pulses dorsalis pedis  Assessment: Left anterior total knee wound dehiscence  Plan: Plan to proceed today with irrigation and closure of the wound.  Will also assess for any disruption of the extensor mechanism.  Based on imaging and with discussion with the patient I believe she ultimately dehisced her arthrotomy and patella subluxed laterally and there is no true injury to the extensor mechanism but we will assess that closely.  We did discuss the risk of bleeding, infection, damage to the surrounding nerves and vessels, stiffness, need for revision surgery or need for longstanding antibiotics, DVT risk as well as the risk of anesthesia.  She has provided informed consent.  She will be placed in a knee immobilizer postoperatively and will be weightbearing as tolerated in that knee immobilizer but will strictly be in that for at least 1 week to allow for some healing of the arthrotomy.  We appreciate the hospitalist service for admission and overall medical management.    Yolonda Kida, MD Cell [phone]    12/11/2022 3:58 PM

## 2022-12-11 NOTE — Assessment & Plan Note (Signed)
   No evidence of COPD exacerbation this time  Continue home regimen of maintenance inhalers  As needed bronchodilator therapy for episodic shortness of breath and wheezing.  

## 2022-12-11 NOTE — Hospital Course (Signed)
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  Of note, patient underwent left TKA on 11/22 with Dr. Shelle Iron.   Patient reports that while at home she stood up quickly and heard a pop coming from her left knee on 12/28 followed by significant left knee pain.  Patient describes this pain as sharp in quality, radiating proximally and worse with any weightbearing or ambulation.  Patient proceeded to present to [facility] emergency department for evaluation.  X-ray imaging of the knee was performed and was unremarkable and the patient was discharged home.  Upon arriving home she attempted to ambulate on the leg but due to severe pain she fell landing on her left knee.  This resulted in surgical wound dehiscence over her knee with associated significant bleeding and worsening pain.  EMS was contacted who promptly came to evaluate the patient and brought her in to [facility] emergency department for evaluation.  Upon evaluation in the emergency department early this morning, CT imaging of the knee was obtained revealing a patellar rupture.  EDP discussed case with Dr. Lequita Halt with orthopedic surgery who recommended the patient be n.p.o. and that orthopedic surgery, evaluate the patient in consultation and likely take the patient for operative intervention today.  The hospitalist group has now been called to assess the patient for admission to the hospital.

## 2022-12-11 NOTE — Brief Op Note (Addendum)
12/11/2022  5:38 PM  PATIENT:  Norma Anthony  66 y.o. female  PRE-OPERATIVE DIAGNOSIS:  LEFT KNEE DEHISCENCE  POST-OPERATIVE DIAGNOSIS:  LEFT KNEE DEHISCENCE  PROCEDURE:  Procedure(s): 1.  IRRIGATION AND DEBRIDEMENT EXTREMITY left knee including skin, subcutaneous tissue, muscle and fascia.  (Left) 2.  Left knee complex closure of open arthrotomy including layered closure of skin and subcutaneous tissue.  SURGEON:  Surgeon(s) and Role:    * Lenville Hibberd, Noah Delaine, MD - Primary  PHYSICIAN ASSISTANT: Dion Saucier, PA-C   ANESTHESIA:   local and general  EBL:  100 cc  BLOOD ADMINISTERED:none  DRAINS: none   LOCAL MEDICATIONS USED:  MARCAINE     SPECIMEN:  No Specimen  DISPOSITION OF SPECIMEN:  N/A  COUNTS:  YES  TOURNIQUET:  * Missing tourniquet times found for documented tourniquets in log: [id number] *  DICTATION: .Note written in EPIC  PLAN OF CARE: Admit to inpatient   PATIENT DISPOSITION:  PACU - hemodynamically stable.   Delay start of Pharmacological VTE agent (>24hrs) due to surgical blood loss or risk of bleeding: Please hold Xarelto for 48 hours postoperatively.  May resume on Sunday.

## 2022-12-11 NOTE — Anesthesia Postprocedure Evaluation (Signed)
Anesthesia Post Note  Patient: Norma Anthony  Procedure(s) Performed: IRRIGATION AND DEBRIDEMENT EXTREMITY (Left: Knee)     Patient location during evaluation: PACU Anesthesia Type: General Level of consciousness: awake and alert Pain management: pain level controlled Vital Signs Assessment: post-procedure vital signs reviewed and stable Respiratory status: spontaneous breathing, nonlabored ventilation, respiratory function stable and patient connected to nasal cannula oxygen Cardiovascular status: blood pressure returned to baseline and stable Postop Assessment: no apparent nausea or vomiting Anesthetic complications: no   No notable events documented.  Last Vitals:  Vitals:   12/11/22 1815 12/11/22 1830  BP: (!) 188/99 (!) 143/89  Pulse: (!) 105 (!) 111  Resp: 15 (!) 24  Temp:    SpO2: 96% 93%    Last Pain:  Vitals:   12/11/22 1822  TempSrc:   PainSc: 8                  Lucretia Kern

## 2022-12-11 NOTE — H&P (Addendum)
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  Of note, patient underwent left TKA on 11/22 with Dr. Shelle Iron.   Patient reports that while at home she stood up quickly and heard a pop coming from her left knee on 12/28 followed by significant left knee pain.  Patient describes this pain as sharp in quality, radiating proximally and worse with any weightbearing or ambulation.  Patient proceeded to present to [facility] emergency department for evaluation.  X-ray imaging of the knee was performed and was unremarkable and the patient was discharged home.  Upon arriving home she attempted to ambulate on the leg but due to severe pain she fell landing on her left knee.  This resulted in surgical wound dehiscence over her knee with associated significant bleeding and worsening pain.  EMS was contacted who promptly came to evaluate the patient and brought her in to [facility] emergency department for evaluation.  Upon evaluation in the emergency department early this morning, CT imaging of the knee was obtained revealing a patellar rupture.  EDP discussed case with Dr. Lequita Halt with orthopedic surgery who recommended the patient be n.p.o. and that orthopedic surgery, evaluate the patient in consultation and likely take the patient for operative intervention today.  The hospitalist group has now  been called to assess the patient for admission to the hospital.    Review of Systems: Review of Systems  Musculoskeletal:  Positive for falls, joint pain and myalgias.  All other systems reviewed and are negative.    Past Medical History:  Diagnosis Date   Anxiety    CAD (coronary artery disease)    COPD (chronic obstructive pulmonary disease) (HCC)    Depression    Dyspnea    Fibromyalgia    GERD (gastroesophageal reflux disease)    Hiatal hernia    History of kidney stones    Migraine headache    Multinodular goiter    Nephrolithiasis    Pulmonary embolism (HCC) 1996   Sleep apnea    Thyroid nodule     Past Surgical History:  Procedure Laterality Date   ABDOMINAL HYSTERECTOMY     BIOPSY THYROID     HERNIA REPAIR     IVC FILTER INSERTION N/A 09/28/2022   Procedure: IVC FILTER INSERTION;  Surgeon: Maeola Harman, MD;  Location: [facility] INVASIVE CV LAB;  Service: Cardiovascular;  Laterality: N/A;   KIDNEY STONE SURGERY     LAPAROSCOPIC NISSEN FUNDOPLICATION     PAROTIDECTOMY     TONSILLECTOMY     TOTAL KNEE ARTHROPLASTY Left 11/04/2022   Procedure: TOTAL KNEE ARTHROPLASTY;  Surgeon: Jene Every, MD;  Location: [facility] ORS;  Service: Orthopedics;  Laterality: Left;    Social History:  reports that she quit smoking about 21 months ago. Her smoking use included cigarettes. She has a 5.00 pack-year smoking history. She has never used smokeless tobacco. She reports that she does  not currently use alcohol. She reports that she does not currently use drugs.  Allergies  Allergen Reactions   Lyrica [Pregabalin] Other (See Comments)     Paralysis present (finding)  not tolerated Pt stated she could not move her body but she could think      Belbuca [Buprenorphine Hcl] Other (See Comments)    " Horrible headache"    Compazine [Prochlorperazine] Anxiety and Other (See Comments)    Other reaction(s): anxiety    Droperidol Anxiety    Other reaction(s): anxiety      Ergotamine-Caffeine Anxiety    Other reaction(s): Increased systolic arterial pressure (finding)     Family History  Problem Relation Age of Onset   Hypertension Mother     Prior to Admission medications   Medication Sig Start Date End Date Taking? Authorizing Provider  albuterol (VENTOLIN HFA) 108 (90 Base) MCG/ACT inhaler Inhale 2 puffs into the lungs every 6 (six) hours as needed for wheezing or shortness of breath. 05/12/21   [provider]  ALPRAZolam Prudy Feeler) 1 MG tablet Take 1 mg by mouth 4 (four) times daily as needed for anxiety.    [provider]  amoxicillin (AMOXIL) 500 MG capsule Take 1 capsule (500 mg total) by mouth 3 (three) times daily. 11/10/22   Jene Every, MD  b complex vitamins capsule Take 1 capsule by mouth daily.    [provider]  benzonatate (TESSALON) 100 MG capsule Take 1 capsule (100 mg total) by mouth every 8 (eight) hours. Patient not taking: Reported on 11/20/2022 07/05/22   Arthor Captain, PA-C  Cholecalciferol (VITAMIN D3) 1.25 MG (50000 UT) CAPS Take 50,000 Units by mouth every Wednesday. 02/16/22   [provider]  diphenhydrAMINE (BENADRYL) 25 mg capsule Take 25 mg by mouth daily as needed for allergies.    [provider]  docusate sodium (COLACE) 100 MG capsule Take 1 capsule (100 mg total) by mouth 2 (two) times daily as needed for mild constipation. 11/04/22   Jene Every, MD  DULoxetine (CYMBALTA) 60 MG capsule Take 60 mg by mouth at bedtime. 09/14/18   [provider]  famotidine (PEPCID) 20 MG tablet One after supper Patient taking differently: Take 20 mg by mouth daily. One after supper 04/30/22   Nyoka Cowden, MD  Ferrous Fumarate-Folic Acid 324-1 MG TABS Take 1 tablet by mouth daily. Hematinic 02/29/20   [provider]  furosemide (LASIX) 20 MG tablet Take 1 tablet (20 mg total) by mouth daily. Patient not taking: Reported on 11/20/2022 08/27/22   Teressa Lower, PA-C   gabapentin (NEURONTIN) 400 MG capsule Take 400 mg by mouth 3 (three) times daily. 08/21/22   [provider]  Meclizine HCl 25 MG CHEW Chew 25 mg by mouth 3 (three) times daily as needed (dizziness).    [provider]  melatonin 3 MG TABS tablet Take 3 mg by mouth at bedtime.    [provider]  Multiple Vitamins-Minerals (MULTIVITAMIN GUMMIES ADULT PO) Take 1 tablet by mouth daily. With Zinc and vitamin C    [provider]  naloxone (NARCAN) nasal spray 4 mg/0.1 mL Place 1 spray into the nose once as needed (accidental over dose). 10/16/21   [provider]  oxyCODONE (OXY IR/ROXICODONE) 5 MG immediate release tablet Take 1 tablet (5 mg total) by mouth every 4 (four) hours as needed for severe pain. 11/10/22   Jene Every, MD  pantoprazole (PROTONIX) 40 MG tablet Take 40 mg by mouth every  morning. 06/30/16   [provider]  polyethylene glycol (MIRALAX / GLYCOLAX) 17 g packet Take 17 g by mouth daily. 11/04/22   Jene Every, MD  XARELTO 20 MG TABS tablet Take 20 mg by mouth daily. 08/21/21   [provider]    Physical Exam:  Vitals:   12/11/22 0800 12/11/22 0838 12/11/22 1100 12/11/22 1338  BP: 104/76  (!) 108/55   Pulse: 71  80   Resp: 16  12   Temp:  97.6 F (36.4 C)  97.8 F (36.6 C)  TempSrc:  Oral  Oral  SpO2: 99%  97%     Constitutional: Awake alert and oriented x3, patient is in distress due to left knee pain. Skin: Dressing over the left knee is clean dry and intact., poor skin turgor noted. Eyes: Pupils are equally reactive to light.  No evidence of scleral icterus or conjunctival pallor.  ENMT: Moist mucous membranes noted.  Posterior pharynx clear of any exudate or lesions.   Neck: normal, supple, no masses, no thyromegaly.  No evidence of jugular venous distension.   Respiratory: clear to auscultation bilaterally, no wheezing, no crackles. Normal respiratory effort. No accessory muscle use.   Cardiovascular: 3 out of 6 systolic murmur, regular rate and rhythm,. No extremity edema. 2+ pedal pulses. No carotid bruits.  Chest:   Nontender without crepitus or deformity.   Back:   Nontender without crepitus or deformity. Abdomen: Abdomen is soft and nontender.  No evidence of intra-abdominal masses.  Positive bowel sounds noted in all quadrants.   Musculoskeletal: Severe pain of the left knee with both passive and active range of motion.  Dressing over the left knee is clean dry and intact.  no contractures. Normal muscle tone.  Neurologic: CN 2-12 grossly intact. Sensation intact.  Patient moving all 4 extremities spontaneously.  Patient is following all commands.  Patient is responsive to verbal stimuli.   Psychiatric: Patient exhibits normal mood with appropriate affect.  Patient seems to possess insight as to their current situation.    Data Reviewed:  I have personally reviewed and interpreted labs, imaging.  Significant findings are   Lab Results  Component Value Date   WBC 9.7 12/11/2022   HGB 11.4 (L) 12/11/2022   HCT 37.6 12/11/2022   MCV 103.0 (H) 12/11/2022   PLT 194 12/11/2022   Lab Results  Component Value Date   K 3.5 12/11/2022   Lab Results  Component Value Date   BUN 12 12/11/2022   Lab Results  Component Value Date   CREATININE 0.83 12/11/2022        Assessment and Plan: * Wound dehiscence, surgical, initial encounter Patient suffering from traumatic fall with injury to left knee resulting in surgical wound dehiscence and patellar dislocation with significant hemarthrosis EDP discussed case with orthopedic surgery and patient is currently slated to go to the operating room this afternoon for operative debridement and repair with Dr. Aundria Rud. Keeping patient n.p.o. for now Holding home regimen of Xarelto, last dose 12/28 Hydrating patient with intravenous isotonic fluids Opiate-based analgesics for associated pain  Patellar dislocation Please  see assessment and plan above  Hemarthrosis of left knee Please see assessment and plan above  Chronic respiratory failure with hypoxia (HCC) Longstanding history of COPD with chronic respiratory failure requiring 2 L of oxygen via nasal cannula at home. Additional supplemental oxygen as needed for bouts of hypoxia No clinical evidence of COPD exacerbation at this time  COPD (chronic obstructive pulmonary disease) (HCC) No  evidence of COPD exacerbation this time Continue home regimen of maintenance inhalers As needed bronchodilator therapy for episodic shortness of breath and wheezing.   History of pulmonary embolus (PE) Temporarily holding Xarelto in preparation for surgical intervention Patient is slated to have IVC filter removal in January  GERD without esophagitis Continue home regimen of pantoprazole and famotidine  Generalized anxiety disorder Longstanding as needed Xanax regimen confirmed via review of the controlled substance database We will resume the occasion while here       Code Status:  Full code  code status decision has been confirmed with: patient Family Communication: Daughter is at the bedside who has been updated on plan of care  Consults: Dr. Aundria Rudogers with Orthopedic Surgery  Severity of Illness:  The appropriate patient status for this patient is INPATIENT. Inpatient status is judged to be reasonable and necessary in order to provide the required intensity of service to ensure the patient's safety. The patient's presenting symptoms, physical exam findings, and initial radiographic and laboratory data in the context of their chronic comorbidities is felt to place them at high risk for further clinical deterioration. Furthermore, it is not anticipated that the patient will be medically stable for discharge from the hospital within 2 midnights of admission.   * I certify that at the point of admission it is my clinical judgment that the patient will require  inpatient hospital care spanning beyond 2 midnights from the point of admission due to high intensity of service, high risk for further deterioration and high frequency of surveillance required.*  Author:  Marinda ElkGeorge J Rafaelita Foister MD  12/11/2022 1:39 PM

## 2022-12-11 NOTE — Assessment & Plan Note (Signed)
Longstanding history of COPD with chronic respiratory failure requiring 2 L of oxygen via nasal cannula at home. Additional supplemental oxygen as needed for bouts of hypoxia No clinical evidence of COPD exacerbation at this time

## 2022-12-11 NOTE — Transfer of Care (Signed)
Immediate Anesthesia Transfer of Care Note  Patient: Norma Anthony  Procedure(s) Performed: Procedure(s): IRRIGATION AND DEBRIDEMENT EXTREMITY (Left)  Patient Location: PACU  Anesthesia Type:General  Level of Consciousness: Patient easily awoken, sedated, comfortable, cooperative, following commands, responds to stimulation.   Airway & Oxygen Therapy: Patient spontaneously breathing, ventilating well, oxygen via simple oxygen mask.  Post-op Assessment: Report given to PACU RN, vital signs reviewed and stable, moving all extremities.   Post vital signs: Reviewed and stable.  Complications: No apparent anesthesia complications  Last Vitals:  Vitals Value Taken Time  BP 157/103 12/11/22 1753  Temp    Pulse 107 12/11/22 1757  Resp 16 12/11/22 1757  SpO2 91 % 12/11/22 1757  Vitals shown include unvalidated device data.  Last Pain:  Vitals:   12/11/22 1618  TempSrc:   PainSc: 10-Worst pain ever      Patients Stated Pain Goal: 0 (12/11/22 1514)  Complications: No notable events documented.

## 2022-12-11 NOTE — Op Note (Signed)
Date of Surgery: 12/11/2022  INDICATIONS: Norma Anthony is a 66 y.o.-year-old female with a left knee wound dehiscence following a total knee arthroplasty.  She states she felt a pop in the knee and then had a couple episodes of falling.  She then noted that some of the wound opened up.  She presented to the ER with the above.;  The patient did consent to the procedure after discussion of the risks and benefits.  PREOPERATIVE DIAGNOSIS:  Left knee postoperative wound dehiscence  POSTOPERATIVE DIAGNOSIS: Same.  PROCEDURE:  1.  Left knee excisional irrigation debridement of skin, subcutaneous tissue, and muscle  2.  Left knee complex closure of traumatic arthrotomy as well as layered closure of skin and subcutaneous tissues.  SURGEON: Norma Anthony, M.D.  ASSIST: Dion Saucier, PA-C  Assistant attestation:  PA McClung present for the entire procedure..  ANESTHESIA:  general, 1/2% Marcaine with epinephrine  IV FLUIDS AND URINE: See anesthesia.  ESTIMATED BLOOD LOSS: 100 mL.  IMPLANTS: None  DRAINS: None  COMPLICATIONS: None.  DESCRIPTION OF PROCEDURE: The patient was brought to the operating room and placed supine on the operating table.  The patient had been signed prior to the procedure and this was documented. The patient had the anesthesia placed by the anesthesiologist.  A time-out was performed to confirm that this was the correct patient, site, side and location. The patient did receive antibiotics prior to the incision and was re-dosed during the procedure as needed at indicated intervals.  A tourniquet at the thigh was placed.  The patient had the operative extremity prepped and draped in the standard surgical fashion.      We began the procedure by extending the traumatic wound dehiscence from the midportion of her previously used anterior longitudinal incision of the knee.  We extended this proximal and distal just a little bit beyond the previously established parameters.   We encountered initial gush of hematoma.  This took [place] directly down into the knee joint itself where there was clear disruption of the medial parapatellar arthrotomy.  We then inspected after evacuation of the hematoma that the patella tendon and quadriceps tendon were intact.  The medial tissues were very attenuated and quite thin.  There was no obvious disruption of the extensor mechanism however.  We then sharply debrided the skin, subcutaneous tissue, and arthrotomy edges of the muscle with electrocautery and 10 blade.  We then removed any suture material that we noted with rondure.  We then copiously irrigated the wound with 3 L of normal saline Pulsavac.  We then identified the proximal extent of the quadriceps arthrotomy and then began our layered closure.  The arthrotomy was closed with interrupted figure-of-eight Vicryl #1 sutures.  This was done with the knee at approximately 20 degrees of flexion.  We then started closing the subcutaneous tissues with 0 Vicryl in the fat layer, 2-0 Vicryl for deep dermal layer and staples for skin.  A standard sterile Aquacel bandage was applied after the leg was cleaned and dried.  Neck the leg was then placed in a knee immobilizer.  Patient was then awakened from general anesthetic with no noted complications intraoperative.  POSTOPERATIVE PLAN:  She will receive postoperative antibiotics for the next 24 hours.  She will hold off on resuming Xarelto for 48 hours to allow for hemostasis of our surgical wound.  She will be in a extended knee immobilizer at all times for the next 2 weeks.  She can weight-bear as tolerated in  that brace.  She will return to the medicine floor for postoperative physical therapy and dispositional planning.  Follow-up with Dr. Shelle Iron or his PA 2 weeks postop.

## 2022-12-11 NOTE — Assessment & Plan Note (Signed)
Longstanding as needed Xanax regimen confirmed via review of the controlled substance database We will resume the occasion while here

## 2022-12-11 NOTE — ED Notes (Signed)
Patient transported to CT 

## 2022-12-11 NOTE — Assessment & Plan Note (Signed)
Patient suffering from traumatic fall with injury to left knee resulting in surgical wound dehiscence and patellar dislocation with significant hemarthrosis EDP discussed case with orthopedic surgery and patient is currently slated to go to the operating room this afternoon for operative debridement and repair with Dr. Aundria Rud. Keeping patient n.p.o. for now Holding home regimen of Xarelto, last dose 12/28 Hydrating patient with intravenous isotonic fluids Opiate-based analgesics for associated pain

## 2022-12-11 NOTE — Assessment & Plan Note (Signed)
Continue home regimen of pantoprazole and famotidine

## 2022-12-12 ENCOUNTER — Encounter (HOSPITAL_COMMUNITY): Payer: Self-pay | Admitting: Orthopedic Surgery

## 2022-12-12 DIAGNOSIS — T8131XA Disruption of external operation (surgical) wound, not elsewhere classified, initial encounter: Secondary | ICD-10-CM | POA: Diagnosis not present

## 2022-12-12 LAB — COMPREHENSIVE METABOLIC PANEL
ALT: 15 U/L (ref 0–44)
AST: 20 U/L (ref 15–41)
Albumin: 2.8 g/dL — ABNORMAL LOW (ref 3.5–5.0)
Alkaline Phosphatase: 82 U/L (ref 38–126)
Anion gap: 7 (ref 5–15)
BUN: 10 mg/dL (ref 8–23)
CO2: 28 mmol/L (ref 22–32)
Calcium: 8.7 mg/dL — ABNORMAL LOW (ref 8.9–10.3)
Chloride: 104 mmol/L (ref 98–111)
Creatinine, Ser: 0.66 mg/dL (ref 0.44–1.00)
GFR, Estimated: >60 mL/min (ref >60)
Glucose, Bld: 172 mg/dL — ABNORMAL HIGH (ref 70–99)
Potassium: 4.4 mmol/L (ref 3.5–5.1)
Sodium: 139 mmol/L (ref 135–145)
Total Bilirubin: 0.6 mg/dL (ref 0.3–1.2)
Total Protein: 6.1 g/dL — ABNORMAL LOW (ref 6.5–8.1)

## 2022-12-12 LAB — MAGNESIUM: Magnesium: 1.8 mg/dL (ref 1.7–2.4)

## 2022-12-12 MED ORDER — HYDROMORPHONE HCL 1 MG/ML IJ SOLN
1.0000 mg | INTRAMUSCULAR | Status: DC | PRN
  Administered 2022-12-12 – 2022-12-14 (×4): 1 mg via INTRAVENOUS
  Filled 2022-12-12 (×4): qty 1

## 2022-12-12 MED ORDER — ENOXAPARIN SODIUM 40 MG/0.4ML IJ SOSY
40.0000 mg | PREFILLED_SYRINGE | INTRAMUSCULAR | Status: DC
  Administered 2022-12-12 – 2022-12-13 (×2): 40 mg via SUBCUTANEOUS
  Filled 2022-12-12 (×2): qty 0.4

## 2022-12-12 MED ORDER — METHOCARBAMOL 500 MG PO TABS
500.0000 mg | ORAL_TABLET | Freq: Three times a day (TID) | ORAL | Status: DC
  Administered 2022-12-12 – 2022-12-17 (×18): 500 mg via ORAL
  Filled 2022-12-12 (×18): qty 1

## 2022-12-12 MED ORDER — SENNOSIDES-DOCUSATE SODIUM 8.6-50 MG PO TABS
1.0000 | ORAL_TABLET | Freq: Two times a day (BID) | ORAL | Status: DC
  Administered 2022-12-12 – 2022-12-15 (×6): 1 via ORAL
  Filled 2022-12-12 (×7): qty 1

## 2022-12-12 MED ORDER — ALPRAZOLAM 0.5 MG PO TABS
0.5000 mg | ORAL_TABLET | Freq: Four times a day (QID) | ORAL | Status: DC | PRN
  Administered 2022-12-12 – 2022-12-13 (×2): 0.5 mg via ORAL
  Filled 2022-12-12 (×2): qty 1

## 2022-12-12 NOTE — Evaluation (Addendum)
Physical Therapy Evaluation Patient Details Name: Norma Anthony MRN: [id number] DOB: Oct 27, 1956 Today's Date: 12/12/2022  History of Present Illness  66 y.o. female s/p fall resulting in L patellar dislocation with wound dehiscence  -Orthopedics consulted, s/p L knee excisional debridement of skin subcu tissue and muscle with complex closure of traumatic arthrotomy with delayed closure of skin and subcu tissue on 12/11/22.    PMH: anxiety, COPD, migraines, sleep apnea, PE, IVC filter 09/28/22, L TKA 11/04/22  Clinical Impression  Pt admitted with above diagnosis.  Pt anxious regarding mobility however able to amb short distance and tol well. Husband close by which improved pt comfort level. Pt has strong hx of falls. Recommend HHPT unless progress is slow, may need SNF.   Pt currently with functional limitations due to the deficits listed below (see PT Problem List). Pt will benefit from skilled PT to increase their independence and safety with mobility to allow discharge to the venue listed below.          Recommendations for follow up therapy are one component of a multi-disciplinary discharge planning process, led by the attending physician.  Recommendations may be updated based on patient status, additional functional criteria and insurance authorization.  Follow Up Recommendations Home health PT (if progress slow-?SNF)      Assistance Recommended at Discharge Intermittent Supervision/Assistance  Patient can return home with the following  Help with stairs or ramp for entrance;Assist for transportation;Assistance with cooking/housework;A little help with bathing/dressing/bathroom;A little help with walking and/or transfers    Equipment Recommendations None recommended by PT  Recommendations for Other Services       Functional Status Assessment Patient has had a recent decline in their functional status and demonstrates the ability to make significant improvements in function in a  reasonable and predictable amount of time.     Precautions / Restrictions Precautions Precautions: Fall Precaution Comments: assumed no ROM L knee Required Braces or Orthoses: Knee Immobilizer - Left Knee Immobilizer - Left: On at all times Restrictions Weight Bearing Restrictions: No Other Position/Activity Restrictions: WBAT      Mobility  Bed Mobility Overal bed mobility: Needs Assistance Bed Mobility: Supine to Sit     Supine to sit: Min assist     General bed mobility comments: assist to progress L LE off bed, incr time    Transfers Overall transfer level: Needs assistance Equipment used: Rolling walker (2 wheels) Transfers: Sit to/from Stand Sit to Stand: From elevated surface, Min assist           General transfer comment: cues for hand placment and LLE positioning. pt anxious regarding standing ( husband stood by for reassurance)    Ambulation/Gait Ambulation/Gait assistance: Min assist Gait Distance (Feet): 7 Feet Assistive device: Rolling walker (2 wheels) Gait Pattern/deviations: Step-to pattern, Decreased stance time - left       General Gait Details: cues for sequence and RW position from self. pt anxious regarding gait/falls however no overt LOB, min assist to steady  Careers information officer    Modified Rankin (Stroke Patients Only)       Balance Overall balance assessment: Needs assistance, History of Falls Sitting-balance support: No upper extremity supported, Feet supported Sitting balance-Leahy Scale: Fair     Standing balance support: Reliant on assistive device for balance, During functional activity Standing balance-Leahy Scale: Poor  Pertinent Vitals/Pain Pain Assessment Pain Assessment: Faces Faces Pain Scale: Hurts little more Pain Location: L knee Pain Descriptors / Indicators: Grimacing, Sore Pain Intervention(s): Limited activity within patient's tolerance,  Monitored during session, Premedicated before session, Repositioned    Home Living Family/patient expects to be discharged to:: Private residence Living Arrangements: Spouse/significant other Available Help at Discharge: Family;Available 24 hours/day Type of Home: House Home Access: Stairs to enter Entrance Stairs-Rails: None Entrance Stairs-Number of Steps: 1   Home Layout: One level Home Equipment: Agricultural consultant (2 wheels);Cane - single point      Prior Function Prior Level of Function : Independent/Modified Independent;History of Falls (last six months)             Mobility Comments: Tourist information centre manager, drives--prior to TKA; endorses multiple falls       Hand Dominance        Extremity/Trunk Assessment   Upper Extremity Assessment Upper Extremity Assessment: Overall WFL for tasks assessed    Lower Extremity Assessment Lower Extremity Assessment: LLE deficits/detail LLE Deficits / Details: assists with SLR with KI in place, ankle edematous however AROM appears WFL LLE: Unable to fully assess due to immobilization       Communication   Communication: No difficulties  Cognition Arousal/Alertness: Awake/alert Behavior During Therapy: WFL for tasks assessed/performed Overall Cognitive Status: Within Functional Limits for tasks assessed                                          General Comments      Exercises     Assessment/Plan    PT Assessment Patient needs continued PT services  PT Problem List Decreased strength;Decreased range of motion;Decreased activity tolerance;Decreased balance;Decreased mobility;Decreased knowledge of precautions;Decreased knowledge of use of DME       PT Treatment Interventions DME instruction;Therapeutic exercise;Functional mobility training;Therapeutic activities;Patient/family education;Gait training    PT Goals (Current goals can be found in the Care Plan section)  Acute Rehab PT Goals Patient Stated  Goal: to get better PT Goal Formulation: With patient Time For Goal Achievement: 12/25/22 Potential to Achieve Goals: Good    Frequency Min 6X/week     Co-evaluation               AM-PAC PT "6 Clicks" Mobility  Outcome Measure Help needed turning from your back to your side while in a flat bed without using bedrails?: A Little Help needed moving from lying on your back to sitting on the side of a flat bed without using bedrails?: A Little Help needed moving to and from a bed to a chair (including a wheelchair)?: A Little Help needed standing up from a chair using your arms (e.g., wheelchair or bedside chair)?: A Little Help needed to walk in hospital room?: A Little Help needed climbing 3-5 steps with a railing? : A Lot 6 Click Score: 17    End of Session Equipment Utilized During Treatment: Gait belt;Left knee immobilizer Activity Tolerance: Patient tolerated treatment well;Patient limited by fatigue;Other (comment) (anxiety) Patient left: in chair;with call bell/phone within reach;with family/visitor present Nurse Communication: Mobility status PT Visit Diagnosis: Difficulty in walking, not elsewhere classified (R26.2);Other abnormalities of gait and mobility (R26.89)    Time: 3500-9381 PT Time Calculation (min) (ACUTE ONLY): 22 min   Charges:   PT Evaluation $PT Eval Low Complexity: 1 Low          Gresham Caetano, PT  Acute Rehab Dept [facility]) [phone]  [facility] Weekend Pager [facility] only)  [phone]  12/12/2022   [facility] 12/12/2022, 12:57 PM

## 2022-12-12 NOTE — Progress Notes (Signed)
PROGRESS NOTE    Norma Anthony  [id number] DOB: 05/28/1956 DOA: 12/11/2022 PCP: Clinic, [facility]  66/F with chronic respiratory failure,?  COPD on 2 L home O2, chronic pain, fibromyalgia, prior DVT, PE, history of IVC filter on Xarelto, sleep apnea, GERD presented to the ED after falling onto her left knee, recent left TKA done by Dr. Tonita Cong on 11/22. -Workup in the ED noted patellar dislocation with wound dehiscence -Orthopedics consulted, underwent left knee excisional debridement of skin subcu tissue and muscle with complex closure of traumatic arthrotomy with delayed closure of skin and subcu tissue   Subjective: -Feels okay overall, no events overnight, some pain but controlled  Assessment and Plan:  Recent right TKA Wound dehiscence, patellar dislocation, fall -Underwent excisional debridement and complex closure per Dr. Stann Mainland yesterday -Pain control -Add Robaxin -Add Lovenox for DVT prophylaxis today -Resume Xarelto tomorrow if okay with orthopedics  Chronic respiratory failure with hypoxia ([place]) -Presumed COPD, continue supplemental O2, nebs as needed -Will need pulmonary follow-up, does not have a clear diagnosis of COPD, has been on oxygen for a number of years  History of pulmonary embolus (PE)/DVT -Xarelto on hold, resume tomorrow if okay with Ortho  GERD without esophagitis -Continue pantoprazole and famotidine  Generalized anxiety disorder -Longstanding, PRN xanax  Hyperglycemia -Check HbA1c  DVT prophylaxis: Add Lovenox Code Status: Full code Family Communication: Daughter at bedside Disposition Plan: May need rehab  Consultants: Orthopedics Dr. Stann Mainland   Antimicrobials:    Objective: Vitals:   12/11/22 2213 12/11/22 2313 12/12/22 0205 12/12/22 0609  BP: 138/89 (!) 143/78 112/77 (!) 161/104  Pulse: (!) 102 (!) 102 97 95  Resp: 16 18 16 16   Temp: 98.7 F (37.1 C) 98.6 F (37 C) 98.4 F (36.9 C) 98.5 F (36.9 C)  TempSrc: Oral Oral  Oral Oral  SpO2: 93% 94% 92% 94%  Weight:      Height:        Intake/Output Summary (Last 24 hours) at 12/12/2022 0830 Last data filed at 12/12/2022 0600 Gross per 24 hour  Intake 2634 ml  Output 575 ml  Net 2059 ml   Filed Weights   12/11/22 1514  Weight: 104.3 kg    Examination:  General exam: Chronically ill obese female appears older than stated age, AAOx3 HEENT: No JVD CVS: S1-S2, regular rhythm Lungs: Poor air movement bilaterally Abdomen: Soft, nontender, bowel sounds present Extremities: Left lower leg immobilized with dressing, mild distal edema Psychiatry:  Mood & affect appropriate.     Data Reviewed:   CBC: Recent Labs  Lab 12/11/22 0505  WBC 9.7  NEUTROABS 6.5  HGB 11.4*  HCT 37.6  MCV 103.0*  PLT [id number]   Basic Metabolic Panel: Recent Labs  Lab 12/11/22 0505 12/12/22 0628  NA 141 139  K 3.5 4.4  CL 108 104  CO2 25 28  GLUCOSE 117* 172*  BUN 12 10  CREATININE 0.83 0.66  CALCIUM 8.9 8.7*  MG  --  1.8   GFR: Estimated Creatinine Clearance: 85.6 mL/min (by C-G formula based on SCr of 0.66 mg/dL). Liver Function Tests: Recent Labs  Lab 12/11/22 0505 12/12/22 0628  AST 19 20  ALT 17 15  ALKPHOS 89 82  BILITOT 0.5 0.6  PROT 6.4* 6.1*  ALBUMIN 2.8* 2.8*   No results for input(s): "LIPASE", "AMYLASE" in the last 168 hours. No results for input(s): "AMMONIA" in the last 168 hours. Coagulation Profile: Recent Labs  Lab 12/11/22 1812  INR 1.4*   Cardiac  Enzymes: No results for input(s): "CKTOTAL", "CKMB", "CKMBINDEX", "TROPONINI" in the last 168 hours. BNP (last 3 results) No results for input(s): "PROBNP" in the last 8760 hours. HbA1C: No results for input(s): "HGBA1C" in the last 72 hours. CBG: No results for input(s): "GLUCAP" in the last 168 hours. Lipid Profile: No results for input(s): "CHOL", "HDL", "LDLCALC", "TRIG", "CHOLHDL", "LDLDIRECT" in the last 72 hours. Thyroid Function Tests: No results for input(s): "TSH",  "T4TOTAL", "FREET4", "T3FREE", "THYROIDAB" in the last 72 hours. Anemia Panel: No results for input(s): "VITAMINB12", "FOLATE", "FERRITIN", "TIBC", "IRON", "RETICCTPCT" in the last 72 hours. Urine analysis:    Component Value Date/Time   COLORURINE AMBER (A) 11/07/2022 1303   APPEARANCEUR CLOUDY (A) 11/07/2022 1303   LABSPEC 1.020 11/07/2022 1303   PHURINE 7.0 11/07/2022 1303   GLUCOSEU NEGATIVE 11/07/2022 1303   HGBUR NEGATIVE 11/07/2022 1303   BILIRUBINUR NEGATIVE 11/07/2022 1303   KETONESUR 5 (A) 11/07/2022 1303   PROTEINUR 30 (A) 11/07/2022 1303   NITRITE NEGATIVE 11/07/2022 1303   LEUKOCYTESUR LARGE (A) 11/07/2022 1303   Sepsis Labs: @LABRCNTIP (procalcitonin:4,lacticidven:4)  )No results found for this or any previous visit (from the past 240 hour(s)).   Radiology Studies: DG Knee 1-2 Views Left  Result Date: 12/11/2022 CLINICAL DATA:  Dislocation of patella EXAM: LEFT KNEE - 1-2 VIEW COMPARISON:  CT done on 12/11/2022 FINDINGS: There is previous left knee arthroplasty. There is interval reduction of dislocated patella. There are pockets of air in the soft tissues along the anterior aspect. Skin staples are seen anteriorly. IMPRESSION: Surgical reduction of patellar dislocation. Previous left knee arthroplasty. Electronically Signed   By: Elmer Picker M.D.   On: 12/11/2022 19:12   CT Knee Left Wo Contrast  Result Date: 12/11/2022 CLINICAL DATA:  Left knee pain, left knee surgery 11/04/2022. EXAM: CT OF THE LEFT KNEE WITHOUT CONTRAST TECHNIQUE: Multidetector CT imaging of the left knee was performed according to the standard protocol. Multiplanar CT image reconstructions were also generated. RADIATION DOSE REDUCTION: This exam was performed according to the departmental dose-optimization program which includes automated exposure control, adjustment of the mA and/or kV according to patient size and/or use of iterative reconstruction technique. COMPARISON:  None Available.  FINDINGS: Bones/Joint/Cartilage Left total knee arthroplasty. Beam hardening artifact resulting from the orthopedic hardware obscures the adjacent soft tissue and osseous structures. No acute fracture. Lateral dislocation of the patella which continues to be perched on the lateral trochlea. Large hemarthrosis. Ligaments Ligaments are suboptimally evaluated by CT. Muscles and Tendons Patellar tendon and quadriceps tendon are thickened and ill-defined and difficult to determine whether these are postsurgical changes versus an underlying tear. Soft tissue No fluid collection or hematoma. No soft tissue mass. Postsurgical changes in the anterior knee soft tissues. IMPRESSION: 1. Left total knee arthroplasty. Beam hardening artifact resulting from the orthopedic hardware obscures the adjacent soft tissue and osseous structures. 2. Lateral dislocation of the patella which continues to be perched on the lateral trochlea. 3. Patellar tendon and quadriceps tendon are thickened and ill-defined and difficult to determine whether these are postsurgical changes versus an underlying tear. Further characterization with an MRI of the knee with a mars protocol may be helpful. There is too much artifact resulting from the orthopedic hardware, an ultrasound of the knee for evaluation of the quadriceps tendon and patellar tendon would be recommended. 4. Large hemarthrosis. Electronically Signed   By: Kathreen Devoid M.D.   On: 12/11/2022 08:11   CT CHEST WO CONTRAST  Result Date: 12/11/2022 CLINICAL  DATA:  66 year old female with recent left knee surgery. Sharp pain "heard a pop" and fell. EXAM: CT CHEST WITHOUT CONTRAST TECHNIQUE: Multidetector CT imaging of the chest was performed following the standard protocol without IV contrast. RADIATION DOSE REDUCTION: This exam was performed according to the departmental dose-optimization program which includes automated exposure control, adjustment of the mA and/or kV according to patient  size and/or use of iterative reconstruction technique. COMPARISON:  Cervical spine CT today.  Chest CT 03/23/2022. FINDINGS: Cardiovascular: Calcified aortic atherosclerosis. Stable cardiac size at the upper limits of normal. No pericardial effusion. Vascular patency is not evaluated in the absence of IV contrast. Mediastinum/Nodes: Moderate to large gastric hiatal hernia with surrounding surgical clips, staple line is stable since April. No superimposed mediastinal mass or lymphadenopathy. Lungs/Pleura: Lower lung volumes compared to April with some atelectatic changes to the major airways which remain patent. Streaky bilateral peribronchial mostly sub solid opacity predominantly in the upper lobes (series 4, image 46) with underlying mosaic attenuation. Lower lobe mild and more typical dependent atelectasis appearance. No pleural effusion. No consolidation. Middle lobes appear negative. Upper Abdomen: Chronic postoperative changes about the distal intra-abdominal stomach also redemonstrated. Previous ventral abdominal hernia repair with mesh only minimally visible today over the left liver contour. Visible noncontrast liver, spleen, pancreas, left adrenal gland, and splenic flexure appears stable and negative. Musculoskeletal: Osteopenia. Motion artifact at the mid sternum and affecting bilateral rib detail. No convincing No acute osseous abnormality identified. IMPRESSION: 1. Mild motion artifact.  No acute traumatic injury identified. 2. Lower lung volumes with nonspecific streaky bilateral upper lobe opacity. But favor a combination of gas trapping and atelectasis rather than acute viral/atypical respiratory infection. No pleural effusion or other acute pulmonary abnormality. 3. Chronic moderate to large gastric hiatal hernia with postoperative changes surrounding the stomach. 4.  Aortic Atherosclerosis (ICD10-I70.0). Electronically Signed   By: Genevie Ann M.D.   On: 12/11/2022 06:11   CT Cervical Spine Wo  Contrast  Result Date: 12/11/2022 CLINICAL DATA:  66 year old female with recent left knee surgery. Sharp pain "heard a pop" and fell. EXAM: CT CERVICAL SPINE WITHOUT CONTRAST TECHNIQUE: Multidetector CT imaging of the cervical spine was performed without intravenous contrast. Multiplanar CT image reconstructions were also generated. RADIATION DOSE REDUCTION: This exam was performed according to the departmental dose-optimization program which includes automated exposure control, adjustment of the mA and/or kV according to patient size and/or use of iterative reconstruction technique. COMPARISON:  Cervical spine CT 11/20/2022.  Head CT today. FINDINGS: Alignment: Stable exaggerated cervical lordosis. Cervicothoracic junction alignment is within normal limits. Bilateral posterior element alignment is within normal limits. Skull base and vertebrae: Osteopenia. Visualized skull base is intact. No atlanto-occipital dissociation. C1 and C2 appear intact and aligned. Intermittent mild motion artifact. No acute osseous abnormality identified. Soft tissues and spinal canal: No prevertebral fluid or swelling. No visible canal hematoma. Negative visible noncontrast neck soft tissues. Disc levels:  Stable mild for age cervical spine degeneration. Upper chest: Dedicated chest CT today is reported separately. IMPRESSION: 1. Mild motion artifact. No acute traumatic injury identified in the cervical spine. 2. Osteopenia.  Mild for age cervical spine degeneration. Electronically Signed   By: Genevie Ann M.D.   On: 12/11/2022 06:06   CT HEAD WO CONTRAST (5MM)  Result Date: 12/11/2022 CLINICAL DATA:  66 year old female with recent left knee surgery. Sharp pain "heard a pop" and fell. EXAM: CT HEAD WITHOUT CONTRAST TECHNIQUE: Contiguous axial images were obtained from the base of the skull  through the vertex without intravenous contrast. RADIATION DOSE REDUCTION: This exam was performed according to the departmental  dose-optimization program which includes automated exposure control, adjustment of the mA and/or kV according to patient size and/or use of iterative reconstruction technique. COMPARISON:  Head CT 11/20/2022. FINDINGS: Brain: Cerebral volume is within normal limits for age. No midline shift, ventriculomegaly, mass effect, evidence of mass lesion, intracranial hemorrhage or evidence of cortically based acute infarction. Gray-white matter differentiation is within normal limits throughout the brain. Faint asymmetric left basal ganglia vascular calcifications are stable. Vascular: No suspicious intracranial vascular hyperdensity. Skull: Stable osteopenia.  No acute osseous abnormality identified. Sinuses/Orbits: Visualized paranasal sinuses and mastoids are clear. Other: No orbit or scalp soft tissue injury identified. IMPRESSION: No acute traumatic injury identified. Stable and normal for age non contrast CT appearance of the brain. Electronically Signed   By: Odessa Fleming M.D.   On: 12/11/2022 05:59     Scheduled Meds:  docusate sodium  100 mg Oral BID   DULoxetine  60 mg Oral QHS   famotidine  20 mg Oral QPC supper   gabapentin  400 mg Oral TID   influenza vaccine adjuvanted  0.5 mL Intramuscular Tomorrow-1000   melatonin  3 mg Oral QHS   pantoprazole  40 mg Oral Daily   pneumococcal 20-valent conjugate vaccine  0.5 mL Intramuscular Tomorrow-1000   polyethylene glycol  17 g Oral Daily   Continuous Infusions:   ceFAZolin (ANCEF) IV 1 g (12/12/22 0527)     LOS: 1 day    Time spent:    Zannie Cove, MD [facility]   12/12/2022, 8:30 AM

## 2022-12-13 DIAGNOSIS — T8131XA Disruption of external operation (surgical) wound, not elsewhere classified, initial encounter: Principal | ICD-10-CM

## 2022-12-13 LAB — COMPREHENSIVE METABOLIC PANEL
ALT: 11 U/L (ref 0–44)
AST: 15 U/L (ref 15–41)
Albumin: 2.8 g/dL — ABNORMAL LOW (ref 3.5–5.0)
Alkaline Phosphatase: 68 U/L (ref 38–126)
Anion gap: 5 (ref 5–15)
BUN: 12 mg/dL (ref 8–23)
CO2: 32 mmol/L (ref 22–32)
Calcium: 8.3 mg/dL — ABNORMAL LOW (ref 8.9–10.3)
Chloride: 105 mmol/L (ref 98–111)
Creatinine, Ser: 0.69 mg/dL (ref 0.44–1.00)
GFR, Estimated: >60 mL/min (ref >60)
Glucose, Bld: 105 mg/dL — ABNORMAL HIGH (ref 70–99)
Potassium: 3.8 mmol/L (ref 3.5–5.1)
Sodium: 142 mmol/L (ref 135–145)
Total Bilirubin: 0.5 mg/dL (ref 0.3–1.2)
Total Protein: 5.8 g/dL — ABNORMAL LOW (ref 6.5–8.1)

## 2022-12-13 LAB — CBC
HCT: 27.9 % — ABNORMAL LOW (ref 36.0–46.0)
Hemoglobin: 8.4 g/dL — ABNORMAL LOW (ref 12.0–15.0)
MCH: 31.2 pg (ref 26.0–34.0)
MCHC: 30.1 g/dL (ref 30.0–36.0)
MCV: 103.7 fL — ABNORMAL HIGH (ref 80.0–100.0)
Platelets: 192 10*3/uL (ref 150–400)
RBC: 2.69 MIL/uL — ABNORMAL LOW (ref 3.87–5.11)
RDW: 14.2 % (ref 11.5–15.5)
WBC: 8.9 10*3/uL (ref 4.0–10.5)
nRBC: 0 % (ref 0.0–0.2)

## 2022-12-13 MED ORDER — RIVAROXABAN 10 MG PO TABS
20.0000 mg | ORAL_TABLET | Freq: Every day | ORAL | Status: DC
  Administered 2022-12-13 – 2022-12-17 (×5): 20 mg via ORAL
  Filled 2022-12-13 (×5): qty 2

## 2022-12-13 NOTE — Progress Notes (Signed)
Norma Anthony  MRN: [id number] DOB/Age: 65-Jan-1957 65 y.o. Physician: Lynnea Maizes, M.D. 2 Days Post-Op Procedure(s) (LRB): IRRIGATION AND DEBRIDEMENT EXTREMITY (Left)  Subjective: Patient asleep in bed on arrival.  Arousable but remains somewhat groggy.  Oriented.  Temp:  [97.8 F (36.6 C)-98.8 F (37.1 C)] 98.8 F (37.1 C) (12/31 0545) Pulse Rate:  [98-109] 104 (12/31 0545) Resp:  [14-16] 16 (12/31 0545) BP: (116-165)/(63-93) 116/63 (12/31 0545) SpO2:  [94 %-97 %] 94 % (12/31 0545)  Lab Results Recent Labs    12/11/22 0505 12/13/22 0537  WBC 9.7 8.9  HGB 11.4* 8.4*  HCT 37.6 27.9*  PLT 194 192   BMET Recent Labs    12/12/22 0628 12/13/22 0537  NA 139 142  K 4.4 3.8  CL 104 105  CO2 28 32  GLUCOSE 172* 105*  BUN 10 12  CREATININE 0.66 0.69  CALCIUM 8.7* 8.3*   INR  Date Value Ref Range Status  12/11/2022 1.4 (H) 0.8 - 1.2 Final    Comment:    (NOTE) INR goal varies based on device and disease states. Performed at [facility], [address] [place], [place] [address]      Exam  Left lower extremity dressings remain clean and dry.  Compartments are soft.  Gross neurovascular intact left lower extremity. Vital Signs   Impression: Status post fall with dehiscence of postoperative wound with subsequent exploration irrigation and debridement with wound closure.  Patient additionally has a number of significant underlying medical comorbidities.  Progress in therapy has been slow.  Plan Continue to mobilize with physical therapy.  Therapy questions whether she may ultimately require SNF versus home with home health PT.  Continue to monitor clinical progress.  She may resume Xarelto.  Follow-up in Dr. Veronda Prude clinic approximately 2 weeks from her most recent surgery.   Sandi Towe M Talena Neira 12/13/2022, 10:27 AM   Contact # [phone]

## 2022-12-13 NOTE — Progress Notes (Signed)
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 History of left TKA 11/22. CT of the knee was obtained revealing a patellar rupture.  Underwent irrigation and debridement of left knee and complex closure of traumatic arthrotomy on 12/29. Assessment and Plan: History of right TKA. Wound dehiscence and patellar dislocation after a fall. Underwent excisional debridement and complex closure. Continue pain control. Per orthopedic can resume Xarelto. Monitor H&H.  Acute blood loss anemia. Baseline hemoglobin around 14.  After TKA hemoglobin dropped down to around 11.  Currently after procedure hemoglobin dropped down to 8.4. Monitor H&H. Patient has already provided consent for blood transfusion. Transfuse for hemoglobin less than 8.  Generalized weakness and deconditioning. PT OT recommended home health although based on the evaluation do not think the patient is a good candidate for home health for now. Family would like to explore possibility of an SNF.  COPD. OSA. Chronic respiratory failure with hypoxia. H&H relatively stable. For now we will monitor.  GERD. Continue PPI.  Generalized anxiety. On Xanax.  Continue.  Obesity. Placing the patient at high risk of poor outcome. Body mass index is 37.12 kg/m.   HTN. Blood pressure rather elevated. Likely secondary to pain. If remains elevated will add medication.   Subjective: Reports pain still present but well-controlled.  No nausea no vomiting no fever no chills.  Reports severe fatigue.  Chronic shortness of breath unchanged.  Physical Exam: General: in Mild distress, No Rash Cardiovascular: S1 and S2 Present, No Murmur Respiratory: Good  respiratory effort, Bilateral Air entry present.  Faint basal crackles, No wheezes Abdomen: Bowel Sound present, No tenderness Extremities: Bilateral edema Neuro: Alert and oriented x3, no new focal deficit  Data Reviewed: I have Reviewed nursing notes, Vitals, and Lab results. Since last encounter, pertinent lab results CBC and BMP   . I have ordered test including CBC and BMP  .   Disposition: Status is: Inpatient Remains inpatient appropriate because: Monitor H&H.  Will require SNF.  rivaroxaban (XARELTO) tablet 20 mg Start: 12/13/22 1700 SCDs Start: 12/11/22 2011 SCDs Start: 12/11/22 1214 rivaroxaban (XARELTO) tablet 20 mg   Family Communication: Husband at bedside Level of care: Med-Surg Continue MedSurg. Vitals:   12/12/22 1739 12/12/22 2112 12/13/22 0545 12/13/22 1359  BP: (!) 165/93 128/76 116/63 (!) 150/72  Pulse: 98 (!) 109 (!) 104 97  Resp: 16 14 16 16   Temp: 98.6 F (37 C) 98.6 F (37 C) 98.8 F (37.1 C) 98.4 F (36.9 C)  TempSrc: Oral Oral Oral Oral  SpO2: 97% 94% 94% 100%  Weight:      Height:         Author: , MD 12/13/2022 5:43 PM  Please look on [email or web address] to find out who is on call.

## 2022-12-13 NOTE — Progress Notes (Signed)
Physical Therapy Treatment Patient Details Name: Norma Anthony MRN: [id number] DOB: 06/01/1956 Today's Date: 12/13/2022   History of Present Illness 66 y.o. female s/p fall resulting in L patellar dislocation with wound dehiscence  -Orthopedics consulted, s/p L knee excisional debridement of skin subcu tissue and muscle with complex closure of traumatic arthrotomy with delayed closure of skin and subcu tissue on 12/11/22.    PMH: anxiety, COPD, migraines, sleep apnea, PE, IVC filter 09/28/22, L TKA 11/04/22    PT Comments    AxO x 3 but more groggy/slow/sluggish today.  Assisted OOB to recliner was difficult.   General bed mobility comments: KI readjusted as it slid distally.  Pt required MAX Assist esp to complete scooting to EOB.  Pt unable to lift/guide L LE off bed. General transfer comment: Pt required Mod Assist this session due to groggyness/slow moving.  Had difficulty self standing with KI on which extends L LE out to far front.  Pt nervous.  Asissted from elevated bed to recliner  with some difficulty completing 1/4 pivot turns.  Required Mod Asisst for proper hand placement and MAX Asisst to complete turning.  Unstaedy.  Fearful.  Unable to stand upright.  HIGH FALL RISK. General Gait Details: unable to attempt amb today due to increased grogginess/sluggish and unsteady. LPT eval indicates HH vs SNF pending progress.  Pt NOT progressing well enough to safely D/C to home.  Pt will need ST Rehab at SNF to address mobility and functional decline.   Recommendations for follow up therapy are one component of a multi-disciplinary discharge planning process, led by the attending physician.  Recommendations may be updated based on patient status, additional functional criteria and insurance authorization.  Follow Up Recommendations  Skilled nursing-short term rehab (<3 hours/day) Can patient physically be transported by private vehicle: No   Assistance Recommended at Discharge    Patient can  return home with the following Help with stairs or ramp for entrance;Assist for transportation;Assistance with cooking/housework;A little help with bathing/dressing/bathroom;A little help with walking and/or transfers   Equipment Recommendations  None recommended by PT    Recommendations for Other Services       Precautions / Restrictions Precautions Precautions: Fall Precaution Comments: assumed no ROM L knee; "will be in a extended knee immobilizer at all times for the next 2 weeks.  She can weight-bear as tolerated in that brace" Required Braces or Orthoses: Knee Immobilizer - Left Knee Immobilizer - Left: On at all times Restrictions Weight Bearing Restrictions: No Other Position/Activity Restrictions: WBAT     Mobility  Bed Mobility Overal bed mobility: Needs Assistance Bed Mobility: Supine to Sit     Supine to sit: Max assist     General bed mobility comments: KI readjusted as it slid distally.  Pt required MAX Assist esp to complete scooting to EOB.  Pt unable to lift/guide L LE off bed.    Transfers Overall transfer level: Needs assistance Equipment used: Rolling walker (2 wheels) Transfers: Sit to/from Stand Sit to Stand: From elevated surface, Mod assist           General transfer comment: Pt required Mod Assist this session due to groggyness/slow moving.  Had difficulty self standing with KI on which extends L LE out to far front.  Pt nervous.  Asissted from elevated bed to recliner  with some difficulty completing 1/4 pivot turns.  Required Mod Asisst for proper hand placement and MAX Asisst to complete turning.  Unstaedy.  Fearful.  Unable to stand  upright.  HIGH FALL RISK.    Ambulation/Gait               General Gait Details: unable to attempt amb today due to increased grogginess/sluggish and unsteady.   Stairs             Wheelchair Mobility    Modified Rankin (Stroke Patients Only)       Balance                                             Cognition Arousal/Alertness: Awake/alert Behavior During Therapy: WFL for tasks assessed/performed Overall Cognitive Status: Within Functional Limits for tasks assessed                                 General Comments: AxO x 3 but slow and groggy today.        Exercises      General Comments        Pertinent Vitals/Pain Pain Assessment Pain Assessment: 0-10 Faces Pain Scale: Hurts little more Pain Location: L knee Pain Descriptors / Indicators: Grimacing, Sore, Operative site guarding Pain Intervention(s): Monitored during session, Premedicated before session, Repositioned, Ice applied    Home Living                          Prior Function            PT Goals (current goals can now be found in the care plan section) Progress towards PT goals: Progressing toward goals    Frequency    Min 6X/week      PT Plan Current plan remains appropriate    Co-evaluation              AM-PAC PT "6 Clicks" Mobility   Outcome Measure  Help needed turning from your back to your side while in a flat bed without using bedrails?: A Lot Help needed moving from lying on your back to sitting on the side of a flat bed without using bedrails?: A Lot Help needed moving to and from a bed to a chair (including a wheelchair)?: A Lot Help needed standing up from a chair using your arms (e.g., wheelchair or bedside chair)?: A Lot Help needed to walk in hospital room?: A Lot Help needed climbing 3-5 steps with a railing? : Total 6 Click Score: 11    End of Session Equipment Utilized During Treatment: Gait belt;Left knee immobilizer Activity Tolerance: Patient limited by fatigue;Other (comment) (groggy) Patient left: in chair;with call bell/phone within reach Nurse Communication: Mobility status PT Visit Diagnosis: Difficulty in walking, not elsewhere classified (R26.2);Other abnormalities of gait and mobility (R26.89)      Time: 3149-7026 PT Time Calculation (min) (ACUTE ONLY): 25 min  Charges:  $Therapeutic Activity: 23-37 mins                     {Benford Asch  PTA Acute  Rehabilitation Services Office M-F          [phone] Weekend pager [phone]

## 2022-12-14 DIAGNOSIS — T8131XA Disruption of external operation (surgical) wound, not elsewhere classified, initial encounter: Secondary | ICD-10-CM | POA: Diagnosis not present

## 2022-12-14 LAB — CBC
HCT: 29.9 % — ABNORMAL LOW (ref 36.0–46.0)
Hemoglobin: 9.1 g/dL — ABNORMAL LOW (ref 12.0–15.0)
MCH: 31.4 pg (ref 26.0–34.0)
MCHC: 30.4 g/dL (ref 30.0–36.0)
MCV: 103.1 fL — ABNORMAL HIGH (ref 80.0–100.0)
Platelets: 190 10*3/uL (ref 150–400)
RBC: 2.9 MIL/uL — ABNORMAL LOW (ref 3.87–5.11)
RDW: 14 % (ref 11.5–15.5)
WBC: 7.3 10*3/uL (ref 4.0–10.5)
nRBC: 0 % (ref 0.0–0.2)

## 2022-12-14 MED ORDER — LIDOCAINE 5 % EX PTCH
1.0000 | MEDICATED_PATCH | CUTANEOUS | Status: DC
  Administered 2022-12-14 – 2022-12-17 (×4): 1 via TRANSDERMAL
  Filled 2022-12-14 (×4): qty 1

## 2022-12-14 MED ORDER — ACETAMINOPHEN 325 MG PO TABS
650.0000 mg | ORAL_TABLET | Freq: Four times a day (QID) | ORAL | Status: DC | PRN
  Administered 2022-12-15: 650 mg via ORAL

## 2022-12-14 MED ORDER — ACETAMINOPHEN 500 MG PO TABS
500.0000 mg | ORAL_TABLET | Freq: Three times a day (TID) | ORAL | Status: DC
  Administered 2022-12-14 (×3): 500 mg via ORAL
  Filled 2022-12-14 (×4): qty 1

## 2022-12-14 NOTE — Progress Notes (Signed)
Physical Therapy Treatment Patient Details Name: Norma Anthony MRN: [id number] DOB: 1956/04/05 Today's Date: 12/14/2022   History of Present Illness 67 y.o. female s/p fall resulting in L patellar dislocation with wound dehiscence  -Orthopedics consulted, s/p L knee excisional debridement of skin subcu tissue and muscle with complex closure of traumatic arthrotomy with delayed closure of skin and subcu tissue on 12/11/22.    PMH: anxiety, COPD, migraines, sleep apnea, PE, IVC filter 09/28/22, L TKA 11/04/22    PT Comments    Pt ambulated 20' + 15' with RW and L KI with seated rest break, distance limited by fatigue. Pt was assisted with LLE strengthening exercises, with KI on to avoid knee flexion. Ace wrap noted to be saturated in urine, so this was removed, RN notified. Pt is progressing with mobility.     Recommendations for follow up therapy are one component of a multi-disciplinary discharge planning process, led by the attending physician.  Recommendations may be updated based on patient status, additional functional criteria and insurance authorization.  Follow Up Recommendations  Skilled nursing-short term rehab (<3 hours/day) Can patient physically be transported by private vehicle: No   Assistance Recommended at Discharge Intermittent Supervision/Assistance  Patient can return home with the following Help with stairs or ramp for entrance;Assist for transportation;Assistance with cooking/housework;A little help with bathing/dressing/bathroom;A little help with walking and/or transfers   Equipment Recommendations  None recommended by PT    Recommendations for Other Services       Precautions / Restrictions Precautions Precautions: Fall Precaution Comments: no ROM L knee; "will be in a extended knee immobilizer at all times for the next 2 weeks.  She can weight-bear as tolerated in that brace" Required Braces or Orthoses: Knee Immobilizer - Left Knee Immobilizer - Left: On at all  times Restrictions Weight Bearing Restrictions: No Other Position/Activity Restrictions: WBAT     Mobility  Bed Mobility Overal bed mobility: Needs Assistance Bed Mobility: Supine to Sit     Supine to sit: Min assist, HOB elevated     General bed mobility comments: HOB up, min A to raise trunk, advanced LLE using gait belt as leg lifter    Transfers Overall transfer level: Needs assistance Equipment used: Rolling walker (2 wheels) Transfers: Sit to/from Stand Sit to Stand: From elevated surface, Min assist, +2 safety/equipment           General transfer comment: VCs hand placement, sit to stand x 2, assist to power up    Ambulation/Gait Ambulation/Gait assistance: Min guard Gait Distance (Feet): 20 Feet Assistive device: Rolling walker (2 wheels) Gait Pattern/deviations: Step-to pattern, Decreased stance time - left, Decreased step length - left, Decreased step length - right, Trunk flexed Gait velocity: decr     General Gait Details: 31' + 15' with seated rest, with L KI, VCs posture and positioning in RW, distance limited by fatigue   Stairs             Wheelchair Mobility    Modified Rankin (Stroke Patients Only)       Balance Overall balance assessment: Needs assistance, History of Falls Sitting-balance support: No upper extremity supported, Feet supported Sitting balance-Leahy Scale: Fair     Standing balance support: Reliant on assistive device for balance, During functional activity Standing balance-Leahy Scale: Poor                              Cognition Arousal/Alertness: Awake/alert Behavior During Therapy:  WFL for tasks assessed/performed Overall Cognitive Status: Within Functional Limits for tasks assessed                                          Exercises Total Joint Exercises Ankle Circles/Pumps: AROM, Both, 15 reps, Supine Quad Sets: AROM, Left, 10 reps, Supine Hip ABduction/ADduction: AAROM,  Left, 10 reps, Supine Straight Leg Raises: AAROM, Left, 5 reps, Supine    General Comments        Pertinent Vitals/Pain Pain Assessment Faces Pain Scale: Hurts little more Pain Location: L knee Pain Descriptors / Indicators: Grimacing, Sore, Operative site guarding Pain Intervention(s): Limited activity within patient's tolerance, Monitored during session, Premedicated before session, Repositioned    Home Living                          Prior Function            PT Goals (current goals can now be found in the care plan section) Acute Rehab PT Goals Patient Stated Goal: to get better PT Goal Formulation: With patient/family Time For Goal Achievement: 12/25/22 Potential to Achieve Goals: Good Progress towards PT goals: Progressing toward goals    Frequency    Min 6X/week      PT Plan Current plan remains appropriate    Co-evaluation              AM-PAC PT "6 Clicks" Mobility   Outcome Measure  Help needed turning from your back to your side while in a flat bed without using bedrails?: A Lot Help needed moving from lying on your back to sitting on the side of a flat bed without using bedrails?: A Lot Help needed moving to and from a bed to a chair (including a wheelchair)?: A Lot Help needed standing up from a chair using your arms (e.g., wheelchair or bedside chair)?: A Little Help needed to walk in hospital room?: A Little Help needed climbing 3-5 steps with a railing? : Total 6 Click Score: 13    End of Session Equipment Utilized During Treatment: Gait belt;Left knee immobilizer Activity Tolerance: Patient limited by fatigue Patient left: in chair;with call bell/phone within reach;with family/visitor present Nurse Communication: Mobility status PT Visit Diagnosis: Difficulty in walking, not elsewhere classified (R26.2);Other abnormalities of gait and mobility (R26.89)     Time: 1536-1610 PT Time Calculation (min) (ACUTE ONLY): 34  min  Charges:  $Gait Training: 8-22 mins $Therapeutic Exercise: 8-22 mins                     Blondell Reveal Kistler PT 12/14/2022  Acute Rehabilitation Services  Office [phone]

## 2022-12-14 NOTE — TOC Initial Note (Signed)
Transition of Care [facility]) - Initial/Assessment Note   Patient Details  Name: Norma Anthony MRN: [id number] Date of Birth: 02-27-1956  Transition of Care [facility]) CM/SW Contact:    [name], LCSW Phone Number: 12/14/2022, 1:03 PM  Clinical Narrative: PT evaluation recommended SNF and patient is agreeable to being faxed out. FL2 done; PASRR pending. Initial referral faxed out. TOC awaiting bed offers and for PASRR number to be corrected.  Expected Discharge Plan: Skilled Nursing Facility Barriers to Discharge: Insurance Authorization, SNF Pending bed offer  Patient Goals and CMS Choice Patient states their goals for this hospitalization and ongoing recovery are:: Go to short-term rehab before returning home CMS Medicare.gov Compare Post Acute Care list provided to:: Patient Choice offered to / list presented to : Patient  Expected Discharge Plan and Services In-house Referral: Clinical Social Work Post Acute Care Choice: [place] Living arrangements for the past 2 months: [place]           DME Arranged: N/A DME Agency: NA  Prior Living Arrangements/Services Living arrangements for the past 2 months: Single Family Home Lives with:: Spouse Patient language and need for interpreter reviewed:: Yes Do you feel safe going back to the place where you live?: Yes      Need for Family Participation in Patient Care: No (Comment) Care giver support system in place?: Yes (comment) Criminal Activity/Legal Involvement Pertinent to Current Situation/Hospitalization: No - Comment as needed  Activities of Daily Living Home Assistive Devices/Equipment: Cane (specify quad or straight), Walker (specify type), Wheelchair (2 wheel) ADL Screening (condition at time of admission) Patient's cognitive ability adequate to safely complete daily activities?: Yes Is the patient deaf or have difficulty hearing?: No Does the patient have difficulty seeing, even when wearing  glasses/contacts?: No Does the patient have difficulty concentrating, remembering, or making decisions?: No Patient able to express need for assistance with ADLs?: Yes Does the patient have difficulty dressing or bathing?: Yes Independently performs ADLs?: Yes (appropriate for developmental age) Does the patient have difficulty walking or climbing stairs?: Yes Weakness of Legs: Left Weakness of Arms/Hands: None  Permission Sought/Granted Permission sought to share information with : Facility Art therapist granted to share information with : Yes, Verbal Permission Granted Permission granted to share info w AGENCY: SNFs  Emotional Assessment Appearance:: Appears stated age Attitude/Demeanor/Rapport: Engaged Affect (typically observed): Accepting Orientation: : Oriented to Self, Oriented to Place, Oriented to  Time, Oriented to Situation Alcohol / Substance Use: Not Applicable Psych Involvement: No (comment)  Admission diagnosis:  Patellar dislocation [S83.006A] Patellar dislocation, left, initial encounter [S83.005A] Avulsion of left patellar tendon, initial encounter [O67.124P] Patient Active Problem List   Diagnosis Date Noted   Patellar dislocation 12/11/2022   Hemarthrosis of left knee 12/11/2022   Wound dehiscence, surgical, initial encounter 12/11/2022   Chronic respiratory failure with hypoxia ([place]) 12/11/2022   Generalized anxiety disorder 12/11/2022   Patellar dislocation, left, initial encounter 12/11/2022   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   DOE (dyspnea on exertion) 04/30/2022   Former smoker 04/30/2022   Upper airway cough syndrome 04/30/2022   Lung nodule 03/24/2022   Thyroid nodule 03/24/2022   Acute respiratory failure with hypoxia ([place]) 03/23/2022   Opioid dependence ([place]) 03/23/2022   Fibromyalgia 03/23/2022   OSA (obstructive sleep apnea) 03/23/2022    History of pulmonary embolus (PE) 03/23/2022   COPD (chronic obstructive pulmonary disease) ([place]) 03/22/2022  Chest pain 03/22/2022   GERD without esophagitis 03/22/2022   PCP:  Clinic, [facility]:   [place], [address] [place] [address] Phone: [phone] Fax: [phone]  Social Determinants of Health (SDOH) Social History: [place]: No Food Insecurity (12/11/2022)  Housing: Low Risk  (12/11/2022)  Transportation Needs: No Transportation Needs (12/11/2022)  Utilities: Not At Risk (12/11/2022)  Tobacco Use: Medium Risk (12/12/2022)   SDOH Interventions: N/A  Readmission Risk Interventions    11/06/2022    9:37 AM  Readmission Risk Prevention Plan  Transportation Screening Complete  PCP or Specialist Appt within 3-5 Days Complete  HRI or [place] Complete  Social Work Consult for [place] Planning/Counseling Complete  Palliative Care Screening Not Applicable  Medication Review Press photographer) Complete

## 2022-12-14 NOTE — Progress Notes (Signed)
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 History of left TKA 11/22. CT of the knee was obtained revealing a patellar rupture.  Underwent irrigation and debridement of left knee and complex closure of traumatic arthrotomy on 12/29. Assessment and Plan: History of right TKA. Wound dehiscence and patellar dislocation after a fall. Underwent excisional debridement and complex closure. Continue pain control.  Add scheduled Tylenol. Per orthopedic can resume Xarelto. Monitor H&H.  Neck pain. Lidocaine patch as well as heating pad.  Acute blood loss anemia. Baseline hemoglobin around 14.  After TKA hemoglobin dropped down to around 11.  Hemoglobin now stable around 9. Monitor H&H. Patient has already provided consent for blood transfusion. Transfuse for hemoglobin less than 8.  Generalized weakness and deconditioning. PT OT recommended home health although based on the evaluation do not think the patient is a good candidate for home health for now. Family would like to explore possibility of an SNF.  COPD. OSA. Chronic respiratory failure with hypoxia. H&H relatively stable. For now we will monitor.  GERD. Continue PPI.  Generalized anxiety. On Xanax.  Continue.  Obesity. Placing the patient at high risk of poor outcome. Body mass index is 37.12 kg/m.   HTN. Blood pressure rather elevated. Likely secondary to pain. If remains elevated will add medication.  Constipation. Continue bowel regimen.   Subjective: Reports neck pain especially when she is moving her head to the right.  No nausea no vomiting.  Continues to have left knee pain.  Physical Exam: General: in Mild distress, No  Rash Cardiovascular: S1 and S2 Present, No Murmur Respiratory: Good respiratory effort, Bilateral Air entry present. No Crackles, No wheezes Abdomen: Bowel Sound present, mild diffuse tenderness Extremities: Left leg edema Neuro: Alert and oriented x3, no new focal deficit   Data Reviewed: I have Reviewed nursing notes, Vitals, and Lab results. Since last encounter, pertinent lab results CBC   . I have ordered test including CBC and BMP  .   Disposition: Status is: Inpatient Remains inpatient appropriate because: Medically stable.  Will require SNF.  rivaroxaban (XARELTO) tablet 20 mg Start: 12/13/22 1700 SCDs Start: 12/11/22 2011 SCDs Start: 12/11/22 1214 rivaroxaban (XARELTO) tablet 20 mg   Family Communication: No one at bedside Level of care: [place]. Vitals:   12/13/22 1359 12/13/22 2037 12/14/22 0549 12/14/22 1405  BP: (!) 150/72 (!) 116/58 (!) 150/72 (!) 130/49  Pulse: 97 100 98 95  Resp: 16 18 18    Temp: 98.4 F (36.9 C) 98.8 F (37.1 C) 98.9 F (37.2 C) 99.2 F (37.3 C)  TempSrc: Oral  Oral Oral  SpO2: 100% 95% 95% 95%  Weight:      Height:         Author: Berle Mull, MD 12/14/2022 5:49 PM  Please look on [email or web address] to find out who is on call.

## 2022-12-14 NOTE — NC FL2 (Signed)
[place] LEVEL OF CARE FORM     IDENTIFICATION  Patient Name: Norma Anthony Birthdate: 1956/08/06 Sex: female Admission Date (Current Location): 12/11/2022  [facility] and [place] Number:  Herbalist and Address:  [facility],  [address], [place]      Provider Number: [id number]  Attending Physician Name and Address:  Lavina Hamman, MD  Relative Name and Phone Number:  Danice Dippolito (husband) Ph: [phone]    Current Level of Care: Hospital Recommended Level of Care: [place] Prior Approval Number:    Date Approved/Denied:   PASRR Number: Pending  Discharge Plan: SNF    Current Diagnoses: Patient Active Problem List   Diagnosis Date Noted   Patellar dislocation 12/11/2022   Hemarthrosis of left knee 12/11/2022   Wound dehiscence, surgical, initial encounter 12/11/2022   Chronic respiratory failure with hypoxia ([place]) 12/11/2022   Generalized anxiety disorder 12/11/2022   Patellar dislocation, left, initial encounter 12/11/2022   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   DOE (dyspnea on exertion) 04/30/2022   Former smoker 04/30/2022   Upper airway cough syndrome 04/30/2022   Lung nodule 03/24/2022   Thyroid nodule 03/24/2022   Acute respiratory failure with hypoxia ([place]) 03/23/2022   Opioid dependence ([place]) 03/23/2022   Fibromyalgia 03/23/2022   OSA (obstructive sleep apnea) 03/23/2022   History of pulmonary embolus (PE) 03/23/2022   COPD (chronic obstructive pulmonary disease) ([place]) 03/22/2022   Chest pain 03/22/2022   GERD without esophagitis 03/22/2022    Orientation RESPIRATION BLADDER Height & Weight     Self, Time, Situation, Place  O2 (2L/min) Incontinent Weight: 230 lb (104.3 kg) Height:  5\' 6"  (167.6 cm)  BEHAVIORAL SYMPTOMS/MOOD NEUROLOGICAL BOWEL NUTRITION STATUS     (N/A) Continent Diet (Regular  diet)  AMBULATORY STATUS COMMUNICATION OF NEEDS Skin   Extensive Assist Verbally Surgical wounds, Other (Comment) (Ecchymosis: right breast, left arm & left knee)                       Personal Care Assistance Level of Assistance  Bathing, Feeding, Dressing Bathing Assistance: Limited assistance Feeding assistance: Independent Dressing Assistance: Limited assistance     Functional Limitations Info  Sight, Hearing, Speech Sight Info: Impaired Hearing Info: Adequate Speech Info: Adequate    SPECIAL CARE FACTORS FREQUENCY  PT (By licensed PT), OT (By licensed OT)     PT Frequency: 5x's/week OT Frequency: 5x's/week            Contractures Contractures Info: Not present    Additional Factors Info  Code Status, Allergies, Psychotropic Code Status Info: Full Allergies Info: Lyrica (Pregabalin), Belbuca (Buprenorphine Hcl), Compazine (Prochlorperazine), Droperidol, Ergotamine-caffeine Psychotropic Info: Xanax, Cymbalta         Current Medications (12/14/2022):  This is the current hospital active medication list Current Facility-Administered Medications  Medication Dose Route Frequency Provider Last Rate Last Admin   acetaminophen (TYLENOL) tablet 500 mg  500 mg Oral TID Lavina Hamman, MD   500 mg at 12/14/22 1146   acetaminophen (TYLENOL) tablet 650 mg  650 mg Oral Q6H PRN Lavina Hamman, MD       albuterol (PROVENTIL) (2.5 MG/3ML) 0.083% nebulizer solution 2.5 mg  2.5 mg Nebulization Q6H PRN Shalhoub, Sherryll Burger, MD       ALPRAZolam Duanne Moron) tablet 0.5 mg  0.5 mg Oral QID PRN Domenic Polite, MD  0.5 mg at 12/13/22 1236   docusate sodium (COLACE) capsule 100 mg  100 mg Oral BID PRN Nicholes Stairs, MD       DULoxetine (CYMBALTA) DR capsule 60 mg  60 mg Oral QHS Nicholes Stairs, MD   60 mg at 12/13/22 2148   famotidine (PEPCID) tablet 20 mg  20 mg Oral QPC supper Nicholes Stairs, MD   20 mg at 12/13/22 1817   gabapentin (NEURONTIN) capsule 400 mg  400  mg Oral TID Nicholes Stairs, MD   400 mg at 12/14/22 1010   HYDROmorphone (DILAUDID) injection 1 mg  1 mg Intravenous Q3H PRN Domenic Polite, MD   1 mg at 12/13/22 9528   influenza vaccine adjuvanted (FLUAD) injection 0.5 mL  0.5 mL Intramuscular Tomorrow-1000 Shalhoub, Sherryll Burger, MD       ipratropium-albuterol (DUONEB) 0.5-2.5 (3) MG/3ML nebulizer solution 3 mL  3 mL Nebulization Q4H PRN Nicholes Stairs, MD       lidocaine (LIDODERM) 5 % 1 patch  1 patch Transdermal Q24H Lavina Hamman, MD   1 patch at 12/14/22 1147   melatonin tablet 3 mg  3 mg Oral QHS Nicholes Stairs, MD   3 mg at 12/13/22 2148   methocarbamol (ROBAXIN) tablet 500 mg  500 mg Oral TID Domenic Polite, MD   500 mg at 12/14/22 1010   ondansetron (ZOFRAN) tablet 4 mg  4 mg Oral Q6H PRN Nicholes Stairs, MD       Or   ondansetron [facility]) injection 4 mg  4 mg Intravenous Q6H PRN Nicholes Stairs, MD       oxyCODONE (Oxy IR/ROXICODONE) immediate release tablet 5 mg  5 mg Oral Q4H PRN Nicholes Stairs, MD   5 mg at 12/14/22 1255   pantoprazole (PROTONIX) EC tablet 40 mg  40 mg Oral Daily Nicholes Stairs, MD   40 mg at 12/14/22 1010   pneumococcal 20-valent conjugate vaccine (PREVNAR 20) injection 0.5 mL  0.5 mL Intramuscular Tomorrow-1000 Nicholes Stairs, MD       polyethylene glycol [facility] Floria Raveling) packet 17 g  17 g Oral Daily Nicholes Stairs, MD   17 g at 12/14/22 1010   rivaroxaban (XARELTO) tablet 20 mg  20 mg Oral Q supper Lavina Hamman, MD   20 mg at 12/13/22 1817   senna-docusate (Senokot-S) tablet 1 tablet  1 tablet Oral BID Domenic Polite, MD   1 tablet at 12/14/22 1010     Discharge Medications: Please see discharge summary for a list of discharge medications.  Relevant Imaging Results:  Relevant Lab Results:   Additional Information SSN: [id number]  [name], LCSW

## 2022-12-14 NOTE — Progress Notes (Signed)
    Subjective: Patient reports pain as mild to moderate.  Denies N/V/CP/SOB/Abd pain. She denies any tingling or numbness on LE bilaterally.   Objective:   VITALS:   Vitals:   12/13/22 0545 12/13/22 1359 12/13/22 2037 12/14/22 0549  BP: 116/63 (!) 150/72 (!) 116/58 (!) 150/72  Pulse: (!) 104 97 100 98  Resp: 16 16 18 18   Temp: 98.8 F (37.1 C) 98.4 F (36.9 C) 98.8 F (37.1 C) 98.9 F (37.2 C)  TempSrc: Oral Oral  Oral  SpO2: 94% 100% 95% 95%  Weight:      Height:        Patient sitting up in bed. NAD. Alert and oriented x3.  Neurologically intact ABD soft Neurovascular intact Sensation intact distally Intact pulses distally Dorsiflexion/Plantar flexion intact No cellulitis present Compartment soft Incision: dressing has scant drainage, intact.   Lab Results  Component Value Date   WBC 8.9 12/13/2022   HGB 8.4 (L) 12/13/2022   HCT 27.9 (L) 12/13/2022   MCV 103.7 (H) 12/13/2022   PLT 192 12/13/2022   BMET    Component Value Date/Time   NA 142 12/13/2022 0537   K 3.8 12/13/2022 0537   CL 105 12/13/2022 0537   CO2 32 12/13/2022 0537   GLUCOSE 105 (H) 12/13/2022 0537   BUN 12 12/13/2022 0537   CREATININE 0.69 12/13/2022 0537   CALCIUM 8.3 (L) 12/13/2022 0537   GFRNONAA >60 12/13/2022 0537     Assessment/Plan: 3 Days Post-Op   Principal Problem:   Wound dehiscence, surgical, initial encounter Active Problems:   COPD (chronic obstructive pulmonary disease) (HCC)   GERD without esophagitis   History of pulmonary embolus (PE)   Patellar dislocation   Hemarthrosis of left knee   Chronic respiratory failure with hypoxia (HCC)   Generalized anxiety disorder   Patellar dislocation, left, initial encounter   WBAT with walker, Knee Immobilizer at all times.  DVT ppx: Xarelto, SCDs, TEDS PO pain control PT/OT: Slow to progress with PT. Continue to ambulated with PT.  Dispo: Patient under care of the medical team, disposition per their recommendation.  Discussion about SNF vs home with HHPT. Follow-up in Dr. Bernadette Hoit clinic approximately 2 weeks from her most recent surgery.    Charlott Rakes, PA-C 12/14/2022, 7:42 AM   [facility]  [facility] [address], [place], [place] [address] Phone: [phone] [email or web address] Facebook  [employer]

## 2022-12-15 DIAGNOSIS — T8131XA Disruption of external operation (surgical) wound, not elsewhere classified, initial encounter: Secondary | ICD-10-CM | POA: Diagnosis not present

## 2022-12-15 LAB — CBC
HCT: 31.6 % — ABNORMAL LOW (ref 36.0–46.0)
Hemoglobin: 9.6 g/dL — ABNORMAL LOW (ref 12.0–15.0)
MCH: 31.6 pg (ref 26.0–34.0)
MCHC: 30.4 g/dL (ref 30.0–36.0)
MCV: 103.9 fL — ABNORMAL HIGH (ref 80.0–100.0)
Platelets: 186 10*3/uL (ref 150–400)
RBC: 3.04 MIL/uL — ABNORMAL LOW (ref 3.87–5.11)
RDW: 14.1 % (ref 11.5–15.5)
WBC: 8 10*3/uL (ref 4.0–10.5)
nRBC: 0 % (ref 0.0–0.2)

## 2022-12-15 LAB — BASIC METABOLIC PANEL
Anion gap: 8 (ref 5–15)
BUN: 12 mg/dL (ref 8–23)
CO2: 29 mmol/L (ref 22–32)
Calcium: 8.5 mg/dL — ABNORMAL LOW (ref 8.9–10.3)
Chloride: 99 mmol/L (ref 98–111)
Creatinine, Ser: 0.69 mg/dL (ref 0.44–1.00)
GFR, Estimated: >60 mL/min (ref >60)
Glucose, Bld: 108 mg/dL — ABNORMAL HIGH (ref 70–99)
Potassium: 3.7 mmol/L (ref 3.5–5.1)
Sodium: 136 mmol/L (ref 135–145)

## 2022-12-15 LAB — HEMOGLOBIN A1C
Hgb A1c MFr Bld: 5.4 % (ref 4.8–5.6)
Mean Plasma Glucose: 108 mg/dL

## 2022-12-15 LAB — MAGNESIUM: Magnesium: 2.1 mg/dL (ref 1.7–2.4)

## 2022-12-15 MED ORDER — FUROSEMIDE 40 MG PO TABS
40.0000 mg | ORAL_TABLET | Freq: Once | ORAL | Status: AC
  Administered 2022-12-15: 40 mg via ORAL
  Filled 2022-12-15: qty 1

## 2022-12-15 MED ORDER — OXYCODONE HCL 5 MG PO TABS
5.0000 mg | ORAL_TABLET | ORAL | Status: DC | PRN
  Administered 2022-12-15 – 2022-12-17 (×5): 5 mg via ORAL
  Filled 2022-12-15 (×7): qty 1

## 2022-12-15 MED ORDER — FUROSEMIDE 20 MG PO TABS
20.0000 mg | ORAL_TABLET | Freq: Every day | ORAL | Status: DC
  Administered 2022-12-16 – 2022-12-17 (×2): 20 mg via ORAL
  Filled 2022-12-15 (×2): qty 1

## 2022-12-15 MED ORDER — MAGNESIUM HYDROXIDE 400 MG/5ML PO SUSP
15.0000 mL | Freq: Once | ORAL | Status: AC
  Administered 2022-12-15: 15 mL via ORAL
  Filled 2022-12-15: qty 30

## 2022-12-15 MED ORDER — SENNOSIDES-DOCUSATE SODIUM 8.6-50 MG PO TABS
2.0000 | ORAL_TABLET | Freq: Two times a day (BID) | ORAL | Status: DC
  Administered 2022-12-15 – 2022-12-17 (×5): 2 via ORAL
  Filled 2022-12-15 (×6): qty 2

## 2022-12-15 MED ORDER — ACETAMINOPHEN 500 MG PO TABS
1000.0000 mg | ORAL_TABLET | Freq: Three times a day (TID) | ORAL | Status: DC
  Administered 2022-12-15 – 2022-12-17 (×8): 1000 mg via ORAL
  Filled 2022-12-15 (×8): qty 2

## 2022-12-15 MED ORDER — POLYETHYLENE GLYCOL 3350 17 G PO PACK
17.0000 g | PACK | Freq: Two times a day (BID) | ORAL | Status: DC
  Administered 2022-12-15 – 2022-12-17 (×3): 17 g via ORAL
  Filled 2022-12-15 (×3): qty 1

## 2022-12-15 NOTE — Progress Notes (Incomplete)
{  PT ALL NOTES:151004} 

## 2022-12-15 NOTE — TOC Progression Note (Signed)
Transition of Care [facility]) - Progression Note   Patient Details  Name: Ashira Kirsten MRN: [id number] Date of Birth: [birth year]  Transition of Care [facility]) CM/SW [place], LCSW Phone Number: 12/15/2022, 1:42 PM  Clinical Narrative: CSW called [place] [facility] to get existing PASRR #: [id number]. CSW reviewed bed offers and Medicare star ratings with patient and [facility] was chosen. CSW confirmed bed availability with Kitty in admissions at [facility]. Facility can accept patient tomorrow pending insurance authorization.  CSW completed insurance authorization on [employer] portal. Reference ID # is: [id number]. Patient has been approved for 12/16/2022-12/18/2022. However, the admit date to the hospital on the [employer] portal is incorrect. CSW requested changed in admission date.  Expected Discharge Plan: [place] Barriers to Discharge: Ship broker, SNF Pending bed offer  Expected Discharge Plan and Services In-house Referral: Clinical Social Work Post Acute Care Choice: [place] Living arrangements for the past 2 months: Single Family Home            DME Arranged: N/A DME Agency: NA  Social Determinants of Health (SDOH) Interventions [place]: No Food Insecurity (12/11/2022)  Housing: Low Risk  (12/11/2022)  Transportation Needs: No Transportation Needs (12/11/2022)  Utilities: Not At Risk (12/11/2022)  Tobacco Use: Medium Risk (12/12/2022)   Readmission Risk Interventions    11/06/2022    9:37 AM  Readmission Risk Prevention Plan  Transportation Screening Complete  PCP or Specialist Appt within 3-5 Days Complete  HRI or [place] Complete  Social Work Consult for [place] Planning/Counseling Complete  Palliative Care Screening Not Applicable  Medication Review Press photographer) Complete

## 2022-12-15 NOTE — Progress Notes (Signed)
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 History of left TKA 11/22. CT of the knee was obtained revealing a patellar rupture.  Underwent irrigation and debridement of left knee and complex closure of traumatic arthrotomy on 12/29. Assessment and Plan: History of right TKA. Wound dehiscence and patellar dislocation after a fall. Underwent excisional debridement and complex closure. Continue pain control.  Add scheduled Tylenol.  Neck pain. Resolved. Lidocaine patch as well as heating pad.  Acute blood loss anemia. Baseline hemoglobin around 14.  After TKA hemoglobin dropped down to around 11.  Hemoglobin now stable around 9. Patient has provided consent for blood transfusion. Transfuse for hemoglobin less than 8.  COPD. OSA. Chronic respiratory failure with hypoxia. H&H relatively stable. For now we will monitor.  GERD. Continue PPI.  Generalized anxiety. On Xanax.  Continue.  Obesity. Placing the patient at high risk of poor outcome. Body mass index is 37.12 kg/m.   HTN. Blood pressure rather elevated. Likely secondary to pain. If remains elevated will add medication.  Constipation. Continue bowel regimen.  Bilateral lower extremity edema. Will treat with IV Lasix.  If remains tomorrow we will check lower extremity Doppler.   Subjective: No acute complaint.  Neck pain better.  Passing gas.  Somewhat confused.  Does not remember when was her labs were drawn.  Swelling of the leg is new.  Physical Exam: General: in Mild distress, No Rash Cardiovascular: S1 and S2 Present, No Murmur Respiratory: Good respiratory effort, Bilateral Air entry present. No Crackles, No wheezes Abdomen:  Bowel Sound present, No tenderness Extremities: Bilateral edema Neuro: Alert and oriented x3, no new focal deficit no asterixis.  Data Reviewed: I have Reviewed nursing notes, Vitals, and Lab results. Since last encounter, pertinent lab results CBC and BMP   . I have ordered test including CBC and BMP  .    Disposition: Status is: Inpatient Remains inpatient appropriate because: Medically stable.  Will require SNF.  rivaroxaban (XARELTO) tablet 20 mg Start: 12/13/22 1700 SCDs Start: 12/11/22 2011 SCDs Start: 12/11/22 1214 rivaroxaban (XARELTO) tablet 20 mg   Family Communication: Husband at bedside Level of care: [place]. Vitals:   12/14/22 1405 12/14/22 2145 12/15/22 0535 12/15/22 1305  BP: (!) 130/49 (!) 136/58 (!) 153/85 (!) 157/84  Pulse: 95  97 99  Resp:   16 17  Temp: 99.2 F (37.3 C) 98.8 F (37.1 C) 99.4 F (37.4 C) 99.2 F (37.3 C)  TempSrc: Oral Oral Oral Oral  SpO2: 95% 96% 91% 91%  Weight:      Height:         Author: Berle Mull, MD 12/15/2022 7:50 PM  Please look on [email or web address] to find out who is on call.

## 2022-12-16 ENCOUNTER — Inpatient Hospital Stay (HOSPITAL_COMMUNITY): Payer: Medicare Other

## 2022-12-16 DIAGNOSIS — T8131XA Disruption of external operation (surgical) wound, not elsewhere classified, initial encounter: Secondary | ICD-10-CM | POA: Diagnosis not present

## 2022-12-16 LAB — URINALYSIS, ROUTINE W REFLEX MICROSCOPIC
Bilirubin Urine: NEGATIVE
Glucose, UA: NEGATIVE mg/dL
Hgb urine dipstick: NEGATIVE
Ketones, ur: NEGATIVE mg/dL
Leukocytes,Ua: NEGATIVE
Nitrite: NEGATIVE
Protein, ur: NEGATIVE mg/dL
Specific Gravity, Urine: 1.012 (ref 1.005–1.030)
pH: 9 — ABNORMAL HIGH (ref 5.0–8.0)

## 2022-12-16 NOTE — Progress Notes (Signed)
Physical Therapy Treatment Patient Details Name: Norma Anthony MRN: [id number] DOB: 30-Mar-1956 Today's Date: 12/16/2022   History of Present Illness 66 y.o. female s/p fall resulting in L patellar dislocation with wound dehiscence  -Orthopedics consulted, s/p L knee excisional debridement of skin subcu tissue and muscle with complex closure of traumatic arthrotomy with delayed closure of skin and subcu tissue on 12/11/22.    PMH: anxiety, COPD, migraines, sleep apnea, PE, IVC filter 09/28/22, L TKA 11/04/22    PT Comments    Pt tolerated significant increase in ambulation distance today, she ambulated 110' with RW and L KI, no loss of balance. Pt performed LLE strengthening exercises with supervision.     Recommendations for follow up therapy are one component of a multi-disciplinary discharge planning process, led by the attending physician.  Recommendations may be updated based on patient status, additional functional criteria and insurance authorization.  Follow Up Recommendations  Skilled nursing-short term rehab (<3 hours/day) Can patient physically be transported by private vehicle: No   Assistance Recommended at Discharge Intermittent Supervision/Assistance  Patient can return home with the following Help with stairs or ramp for entrance;Assist for transportation;Assistance with cooking/housework;A little help with bathing/dressing/bathroom;A little help with walking and/or transfers   Equipment Recommendations  None recommended by PT    Recommendations for Other Services       Precautions / Restrictions Precautions Precautions: Fall Precaution Comments: no ROM L knee; "will be in a extended knee immobilizer at all times for the next 2 weeks.  She can weight-bear as tolerated in that brace" Required Braces or Orthoses: Knee Immobilizer - Left Knee Immobilizer - Left: On at all times Restrictions Weight Bearing Restrictions: No Other Position/Activity Restrictions: WBAT      Mobility  Bed Mobility               General bed mobility comments: up in recliner    Transfers Overall transfer level: Needs assistance Equipment used: Rolling walker (2 wheels) Transfers: Sit to/from Stand Sit to Stand: From elevated surface, Min assist, +2 safety/equipment           General transfer comment: VCs hand placement, sit to stand x 2, assist to power up    Ambulation/Gait Ambulation/Gait assistance: Min guard Gait Distance (Feet): 110 Feet Assistive device: Rolling walker (2 wheels) Gait Pattern/deviations: Step-to pattern, Decreased stance time - left, Decreased step length - left, Decreased step length - right, Trunk flexed Gait velocity: decr     General Gait Details: with L KI, VCs posture and positioning in RW, distance limited by fatigue   Stairs             Wheelchair Mobility    Modified Rankin (Stroke Patients Only)       Balance Overall balance assessment: Needs assistance, History of Falls Sitting-balance support: No upper extremity supported, Feet supported Sitting balance-Leahy Scale: Fair     Standing balance support: Reliant on assistive device for balance, During functional activity Standing balance-Leahy Scale: Poor                              Cognition Arousal/Alertness: Awake/alert Behavior During Therapy: WFL for tasks assessed/performed Overall Cognitive Status: Within Functional Limits for tasks assessed  Exercises Total Joint Exercises Ankle Circles/Pumps: AROM, Both, 15 reps, Supine Quad Sets: AROM, Left, 10 reps, Supine Hip ABduction/ADduction: AAROM, Left, 10 reps, Supine Straight Leg Raises: AAROM, Left, Supine, 10 reps    General Comments        Pertinent Vitals/Pain Pain Assessment Pain Score: 8  Pain Location: L knee Pain Descriptors / Indicators: Grimacing, Sore, Operative site guarding Pain Intervention(s): Limited  activity within patient's tolerance, Monitored during session, Repositioned    Home Living                          Prior Function            PT Goals (current goals can now be found in the care plan section) Acute Rehab PT Goals Patient Stated Goal: to get better PT Goal Formulation: With patient/family Time For Goal Achievement: 12/25/22 Potential to Achieve Goals: Good Progress towards PT goals: Progressing toward goals    Frequency    Min 3X/week      PT Plan Current plan remains appropriate    Co-evaluation              AM-PAC PT "6 Clicks" Mobility   Outcome Measure  Help needed turning from your back to your side while in a flat bed without using bedrails?: A Lot Help needed moving from lying on your back to sitting on the side of a flat bed without using bedrails?: A Lot Help needed moving to and from a bed to a chair (including a wheelchair)?: A Little Help needed standing up from a chair using your arms (e.g., wheelchair or bedside chair)?: A Little Help needed to walk in hospital room?: A Little Help needed climbing 3-5 steps with a railing? : Total 6 Click Score: 14    End of Session Equipment Utilized During Treatment: Gait belt;Left knee immobilizer Activity Tolerance: Patient limited by fatigue Patient left: in chair;with call bell/phone within reach;with family/visitor present Nurse Communication: Mobility status PT Visit Diagnosis: Difficulty in walking, not elsewhere classified (R26.2);Other abnormalities of gait and mobility (R26.89)     Time: 1342-1400 PT Time Calculation (min) (ACUTE ONLY): 18 min  Charges:  $Gait Training: 8-22 mins                     Blondell Reveal Kistler PT 12/16/2022  Acute Rehabilitation Services  Office [phone]

## 2022-12-16 NOTE — TOC Progression Note (Signed)
Transition of Care [facility]) - Progression Note   Patient Details  Name: Norma Anthony MRN: [id number] Date of Birth: Feb 03, 1956  Transition of Care [facility]) CM/SW [place], LCSW Phone Number: 12/16/2022, 1:04 PM  Clinical Narrative: Insurance authorization issue with admission date has been corrected in the [facility] portal. CSW updated Kitty in admissions at [facility] that patient is expected to discharge tomorrow.   Expected Discharge Plan: [place] Barriers to Discharge: Ship broker, SNF Pending bed offer  Expected Discharge Plan and Services In-house Referral: Clinical Social Work Post Acute Care Choice: [place] Living arrangements for the past 2 months: Single Family Home           DME Arranged: N/A DME Agency: NA  Social Determinants of Health (SDOH) Interventions [place]: No Food Insecurity (12/11/2022)  Housing: Low Risk  (12/11/2022)  Transportation Needs: No Transportation Needs (12/11/2022)  Utilities: Not At Risk (12/11/2022)  Tobacco Use: Medium Risk (12/12/2022)   Readmission Risk Interventions    11/06/2022    9:37 AM  Readmission Risk Prevention Plan  Transportation Screening Complete  PCP or Specialist Appt within 3-5 Days Complete  HRI or [place] Complete  Social Work Consult for [place] Planning/Counseling Complete  Palliative Care Screening Not Applicable  Medication Review Press photographer) Complete

## 2022-12-16 NOTE — Care Management Important Message (Signed)
Important Message  Patient Details IM Letter placed in Patient's room. Name: Norma Anthony MRN: [id number] Date of Birth: 1956/08/05   Medicare Important Message Given:  Yes     Kerin Salen 12/16/2022, 8:53 AM

## 2022-12-16 NOTE — Progress Notes (Addendum)
PT Treatment Note  Pt tolerated increased ambulation distance of 20' + 20' with seated rest today. Performed LLE therapeutic exercise with assist.    12/15/22 1433  PT Visit Information  Assistance Needed +1  History of Present Illness 67 y.o. female s/p fall resulting in L patellar dislocation with wound dehiscence  -Orthopedics consulted, s/p L knee excisional debridement of skin subcu tissue and muscle with complex closure of traumatic arthrotomy with delayed closure of skin and subcu tissue on 12/11/22.    PMH: anxiety, COPD, migraines, sleep apnea, PE, IVC filter 09/28/22, L TKA 11/04/22  Subjective Data  Patient Stated Goal to get better  Precautions  Precautions Fall  Precaution Comments no ROM L knee; "will be in a extended knee immobilizer at all times for the next 2 weeks.  She can weight-bear as tolerated in that brace"  Required Braces or Orthoses Knee Immobilizer - Left  Knee Immobilizer - Left On at all times  Restrictions  Weight Bearing Restrictions No  Other Position/Activity Restrictions WBAT  Pain Assessment  Pain Location L knee  Pain Descriptors / Indicators Grimacing;Sore;Operative site guarding  Cognition  Arousal/Alertness Awake/alert  Behavior During Therapy WFL for tasks assessed/performed  Overall Cognitive Status Within Functional Limits for tasks assessed  Bed Mobility  General bed mobility comments up in recliner  Transfers  Overall transfer level Needs assistance  Equipment used Rolling walker (2 wheels)  Transfers Sit to/from Stand  Sit to Stand From elevated surface;Min assist;+2 safety/equipment  General transfer comment VCs hand placement, sit to stand x 2, assist to power up  Ambulation/Gait  Ambulation/Gait assistance Min guard  Assistive device Rolling walker (2 wheels)  Gait Pattern/deviations Step-to pattern;Decreased stance time - left;Decreased step length - left;Decreased step length - right;Trunk flexed  General Gait Details 20' + 20'  with seated rest, with L KI, VCs posture and positioning in RW, distance limited by fatigue  Gait velocity decr  Balance  Overall balance assessment Needs assistance;History of Falls  Sitting-balance support No upper extremity supported;Feet supported  Sitting balance-Leahy Scale Fair  Standing balance support Reliant on assistive device for balance;During functional activity  Standing balance-Leahy Scale Poor  Total Joint Exercises  Ankle Circles/Pumps AROM;Both;15 reps;Supine  Quad Sets AROM;Left;10 reps;Supine  Hip ABduction/ADduction AAROM;Left;10 reps;Supine  Straight Leg Raises AAROM;Left;5 reps;Supine  PT - End of Session  Equipment Utilized During Treatment Gait belt;Left knee immobilizer  Activity Tolerance Patient limited by fatigue  Patient left in chair;with call bell/phone within reach;with family/visitor present  Nurse Communication Mobility status   PT - Assessment/Plan  PT Plan Current plan remains appropriate  PT Visit Diagnosis Difficulty in walking, not elsewhere classified (R26.2);Other abnormalities of gait and mobility (R26.89)  PT Frequency (ACUTE ONLY) Min 3X/week  Follow Up Recommendations Skilled nursing-short term rehab (<3 hours/day)  Can patient physically be transported by private vehicle No  Assistance recommended at discharge Intermittent Supervision/Assistance  Patient can return home with the following Help with stairs or ramp for entrance;Assist for transportation;Assistance with cooking/housework;A little help with bathing/dressing/bathroom;A little help with walking and/or transfers  PT equipment None recommended by PT  AM-PAC PT "6 Clicks" Mobility Outcome Measure (Version 2)  Help needed turning from your back to your side while in a flat bed without using bedrails? 2  Help needed moving from lying on your back to sitting on the side of a flat bed without using bedrails? 2  Help needed moving to and from a bed to a chair (including a wheelchair)? 3  Help needed standing up from a chair using your arms (e.g., wheelchair or bedside chair)? 3  Help needed to walk in hospital room? 3  Help needed climbing 3-5 steps with a railing?  1  6 Click Score 14  Consider Recommendation of Discharge To: CIR/SNF/LTACH  Progressive Mobility  What is the highest level of mobility based on the progressive mobility assessment? Level 5 (Walks with assist in room/hall) - Balance while stepping forward/back and can walk in room with assist - Complete  PT Goal Progression  Progress towards PT goals Progressing toward goals  Acute Rehab PT Goals  PT Goal Formulation With patient/family  Time For Goal Achievement 12/25/22  Potential to Achieve Goals Good  PT Time Calculation  PT Start Time (ACUTE ONLY) 1402  PT Stop Time (ACUTE ONLY) 1425  PT Time Calculation (min) (ACUTE ONLY) 23 min  PT General Charges  $$ ACUTE PT VISIT 1 Visit  PT Treatments  $Gait Training 8-22 mins  $Therapeutic Exercise 8-22 mins  Blondell Reveal Kistler PT 12/16/2022  Acute Rehabilitation Services  Office [phone]

## 2022-12-16 NOTE — Progress Notes (Signed)
      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 History of left TKA 11/22. CT of the knee was obtained revealing a patellar rupture.  Underwent irrigation and debridement of left knee and complex closure of traumatic arthrotomy on 12/29.   Assessment and Plan: History of right TKA Wound dehiscence and patellar dislocation after a fall Underwent excisional debridement and complex closure. Continue pain control.  Add scheduled Tylenol  Fever Noted temp spike on 12/15/22, no leukocytes, asymptomatic UA unremarkable, UC pending CXR unremarkable BC X 2 pending Monitor off antibiotics  Bilateral lower extremity edema BLE doppler pending Continue lasix  Acute blood loss anemia Baseline hemoglobin around 14.  After TKA hemoglobin dropped down to around 11.  Hemoglobin now stable around 9. Patient has provided consent for blood transfusion. Transfuse for hemoglobin less than 8  COPD OSA Chronic respiratory failure with hypoxia Stable  GERD Continue PPI  Generalized anxiety On Xanax  Obesity Placing the patient at high risk of poor outcome. Body mass index is 37.12 kg/m.   HTN Blood pressure elevated ?Likely secondary to pain Monitor  Constipation Continue bowel regimen.     Subjective:  Appears oriented, denies any new complaint.  Husband at bedside   Physical Exam: General: NAD  Cardiovascular: S1, S2 present Respiratory: CTAB Abdomen: Soft, nontender, nondistended, bowel sounds present Musculoskeletal: Noted bilateral pedal edema noted Skin: Normal Psychiatry: Normal mood, flat affect    Disposition: Status is: Inpatient Remains inpatient appropriate because:  Medically stable.  Will require SNF.  rivaroxaban (XARELTO) tablet 20 mg Start: 12/13/22 1700 SCDs Start: 12/11/22 2011 SCDs Start: 12/11/22 1214 rivaroxaban (XARELTO) tablet 20 mg     Family Communication: Discussed with husband at bedside Level of care: Med-Surg   Vitals:   12/15/22 1305 12/15/22 2207 12/16/22 0539 12/16/22 1325  BP: (!) 157/84 125/73 124/66 (!) 155/97  Pulse: 99 (!) 104 94 96  Resp: 17  18 17   Temp: 99.2 F (37.3 C) (!) 100.4 F (38 C) 98.8 F (37.1 C) 98.7 F (37.1 C)  TempSrc: Oral Oral Oral Oral  SpO2: 91% 94% (!) 89% 91%  Weight:      Height:         Author: Alma Friendly, MD 12/16/2022 6:15 PM  Please look on [email or web address] to find out who is on call.

## 2022-12-17 ENCOUNTER — Inpatient Hospital Stay (HOSPITAL_COMMUNITY): Payer: Medicare Other

## 2022-12-17 DIAGNOSIS — R609 Edema, unspecified: Secondary | ICD-10-CM

## 2022-12-17 DIAGNOSIS — M7989 Other specified soft tissue disorders: Secondary | ICD-10-CM

## 2022-12-17 DIAGNOSIS — T8131XA Disruption of external operation (surgical) wound, not elsewhere classified, initial encounter: Secondary | ICD-10-CM | POA: Diagnosis not present

## 2022-12-17 LAB — CBC WITH DIFFERENTIAL/PLATELET
Abs Immature Granulocytes: 0.03 10*3/uL (ref 0.00–0.07)
Basophils Absolute: 0 10*3/uL (ref 0.0–0.1)
Basophils Relative: 1 %
Eosinophils Absolute: 0.3 10*3/uL (ref 0.0–0.5)
Eosinophils Relative: 5 %
HCT: 30.6 % — ABNORMAL LOW (ref 36.0–46.0)
Hemoglobin: 9.2 g/dL — ABNORMAL LOW (ref 12.0–15.0)
Immature Granulocytes: 1 %
Lymphocytes Relative: 27 %
Lymphs Abs: 1.8 10*3/uL (ref 0.7–4.0)
MCH: 30.8 pg (ref 26.0–34.0)
MCHC: 30.1 g/dL (ref 30.0–36.0)
MCV: 102.3 fL — ABNORMAL HIGH (ref 80.0–100.0)
Monocytes Absolute: 0.9 10*3/uL (ref 0.1–1.0)
Monocytes Relative: 14 %
Neutro Abs: 3.5 10*3/uL (ref 1.7–7.7)
Neutrophils Relative %: 52 %
Platelets: 225 10*3/uL (ref 150–400)
RBC: 2.99 MIL/uL — ABNORMAL LOW (ref 3.87–5.11)
RDW: 14.4 % (ref 11.5–15.5)
WBC: 6.6 10*3/uL (ref 4.0–10.5)
nRBC: 0 % (ref 0.0–0.2)

## 2022-12-17 LAB — BASIC METABOLIC PANEL
Anion gap: 9 (ref 5–15)
BUN: 15 mg/dL (ref 8–23)
CO2: 27 mmol/L (ref 22–32)
Calcium: 8.6 mg/dL — ABNORMAL LOW (ref 8.9–10.3)
Chloride: 104 mmol/L (ref 98–111)
Creatinine, Ser: 0.67 mg/dL (ref 0.44–1.00)
GFR, Estimated: >60 mL/min (ref >60)
Glucose, Bld: 107 mg/dL — ABNORMAL HIGH (ref 70–99)
Potassium: 3.4 mmol/L — ABNORMAL LOW (ref 3.5–5.1)
Sodium: 140 mmol/L (ref 135–145)

## 2022-12-17 LAB — URINE CULTURE: Culture: <10000 — AB

## 2022-12-17 MED ORDER — OXYCODONE HCL 10 MG PO TABS: 10.0000 mg | ORAL_TABLET | Freq: Three times a day (TID) | ORAL | 0 refills | Status: AC

## 2022-12-17 MED ORDER — POTASSIUM CHLORIDE CRYS ER 20 MEQ PO TBCR
40.0000 meq | EXTENDED_RELEASE_TABLET | Freq: Once | ORAL | Status: AC
  Administered 2022-12-17: 40 meq via ORAL
  Filled 2022-12-17: qty 2

## 2022-12-17 MED ORDER — SENNOSIDES-DOCUSATE SODIUM 8.6-50 MG PO TABS: 2.0000 | ORAL_TABLET | Freq: Every day | ORAL | 0 refills | Status: AC

## 2022-12-17 MED ORDER — TIZANIDINE HCL 2 MG PO TABS: 2.0000 mg | ORAL_TABLET | Freq: Two times a day (BID) | ORAL | 0 refills | Status: DC | PRN

## 2022-12-17 MED ORDER — ALPRAZOLAM 1 MG PO TABS: 1.0000 mg | ORAL_TABLET | ORAL | 0 refills | Status: DC

## 2022-12-17 MED ORDER — OXYCODONE HCL 10 MG PO TABS: 10.0000 mg | ORAL_TABLET | Freq: Three times a day (TID) | ORAL | 0 refills | Status: DC

## 2022-12-17 MED ORDER — TIZANIDINE HCL 2 MG PO TABS: 2.0000 mg | ORAL_TABLET | Freq: Two times a day (BID) | ORAL | 0 refills | Status: AC | PRN

## 2022-12-17 MED ORDER — ALPRAZOLAM 1 MG PO TABS: 1.0000 mg | ORAL_TABLET | ORAL | 0 refills | Status: AC

## 2022-12-17 NOTE — Progress Notes (Signed)
Physical Therapy Treatment Patient Details Name: Norma Anthony MRN: [id number] DOB: 1956/07/15 Today's Date: 12/17/2022   History of Present Illness 67 y.o. female s/p fall resulting in L patellar dislocation with wound dehiscence  -Orthopedics consulted, s/p L knee excisional debridement of skin subcu tissue and muscle with complex closure of traumatic arthrotomy with delayed closure of skin and subcu tissue on 12/11/22.    PMH: anxiety, COPD, migraines, sleep apnea, PE, IVC filter 09/28/22, L TKA 11/04/22    PT Comments    Pt ambulated 140' with RW and L KI, no loss of balance, SpO2 95% on room air while walking, HR 125-140 while walking, with brief (~20 seconds) standing rest break HR came down to 118. Pt performed RLE strengthening exercises with assistance. She is progressing well with mobility.    Recommendations for follow up therapy are one component of a multi-disciplinary discharge planning process, led by the attending physician.  Recommendations may be updated based on patient status, additional functional criteria and insurance authorization.  Follow Up Recommendations  Skilled nursing-short term rehab (<3 hours/day) Can patient physically be transported by private vehicle: No   Assistance Recommended at Discharge Intermittent Supervision/Assistance  Patient can return home with the following Help with stairs or ramp for entrance;Assist for transportation;Assistance with cooking/housework;A little help with bathing/dressing/bathroom;A little help with walking and/or transfers   Equipment Recommendations  None recommended by PT    Recommendations for Other Services       Precautions / Restrictions Precautions Precautions: Fall Precaution Comments: no ROM L knee; "will be in a extended knee immobilizer at all times for the next 2 weeks.  She can weight-bear as tolerated in that brace" Required Braces or Orthoses: Knee Immobilizer - Left Knee Immobilizer - Left: On at all  times Restrictions Weight Bearing Restrictions: No Other Position/Activity Restrictions: WBAT     Mobility  Bed Mobility               General bed mobility comments: up in recliner    Transfers Overall transfer level: Needs assistance Equipment used: Rolling walker (2 wheels) Transfers: Sit to/from Stand Sit to Stand: From elevated surface, Min assist           General transfer comment: VCs hand placement, sit to stand x 2, assist to power up    Ambulation/Gait Ambulation/Gait assistance: Min guard Gait Distance (Feet): 140 Feet Assistive device: Rolling walker (2 wheels) Gait Pattern/deviations: Step-to pattern, Decreased stance time - left, Decreased step length - left, Decreased step length - right, Trunk flexed Gait velocity: decr     General Gait Details: with L KI, SpO2 95% on room air with walking, HR 125-140 max,  distance limited by fatigue   Stairs             Wheelchair Mobility    Modified Rankin (Stroke Patients Only)       Balance Overall balance assessment: Needs assistance, History of Falls Sitting-balance support: No upper extremity supported, Feet supported Sitting balance-Leahy Scale: Fair     Standing balance support: Reliant on assistive device for balance, During functional activity Standing balance-Leahy Scale: Poor                              Cognition Arousal/Alertness: Awake/alert Behavior During Therapy: WFL for tasks assessed/performed Overall Cognitive Status: Within Functional Limits for tasks assessed  Exercises Total Joint Exercises Ankle Circles/Pumps: AROM, Both, 15 reps, Supine Quad Sets: AROM, Left, 10 reps, Supine Hip ABduction/ADduction: AAROM, Left, 10 reps, Supine Straight Leg Raises: AAROM, Left, Supine, 10 reps    General Comments        Pertinent Vitals/Pain Pain Assessment Pain Score: 0-No pain    Home Living                           Prior Function            PT Goals (current goals can now be found in the care plan section) Acute Rehab PT Goals Patient Stated Goal: to get better PT Goal Formulation: With patient/family Time For Goal Achievement: 12/25/22 Potential to Achieve Goals: Good Progress towards PT goals: Progressing toward goals    Frequency    Min 3X/week      PT Plan Current plan remains appropriate    Co-evaluation              AM-PAC PT "6 Clicks" Mobility   Outcome Measure  Help needed turning from your back to your side while in a flat bed without using bedrails?: A Little Help needed moving from lying on your back to sitting on the side of a flat bed without using bedrails?: A Lot Help needed moving to and from a bed to a chair (including a wheelchair)?: A Little Help needed standing up from a chair using your arms (e.g., wheelchair or bedside chair)?: A Little Help needed to walk in hospital room?: A Little Help needed climbing 3-5 steps with a railing? : A Lot 6 Click Score: 16    End of Session Equipment Utilized During Treatment: Gait belt;Left knee immobilizer Activity Tolerance: Patient limited by fatigue Patient left: in chair;with call bell/phone within reach;with family/visitor present Nurse Communication: Mobility status PT Visit Diagnosis: Difficulty in walking, not elsewhere classified (R26.2);Other abnormalities of gait and mobility (R26.89)     Time: 5732-2025 PT Time Calculation (min) (ACUTE ONLY): 18 min  Charges:  $Gait Training: 8-22 mins                     Blondell Reveal Kistler PT 12/17/2022  Acute Rehabilitation Services  Office [phone]

## 2022-12-17 NOTE — Progress Notes (Signed)
PT Cancellation Note  Patient Details Name: Hiya Point MRN: [id number] DOB: 06-Apr-1956   Cancelled Treatment:    Reason Eval/Treat Not Completed: Medical issues which prohibited therapy (noted dopplers showed RLE DVT. Will hold PT until anticoagulant is started.)   Philomena Doheny PT 12/17/2022  Acute Rehabilitation Services  Office [phone]

## 2022-12-17 NOTE — TOC Transition Note (Signed)
Transition of Care [facility]) - CM/SW Discharge Note  Patient Details  Name: Norma Anthony MRN: [id number] Date of Birth: 1956/03/02  [facility]) CM/SW Contact:  [name], LCSW Phone Number: 12/17/2022, 3:36 PM  Clinical Narrative: Patient is medically ready for discharge. Patient will go to room 306B and the number for report is [phone]. Discharge summary, discharge orders, and SNF transfer report faxed to facility in hub. CSW provided copy of new insurance card to [place] in admissions at [place]. Patient and husband updated regarding discharge. Medical necessity form done; PTAR scheduled. Discharge packet completed. RN updated. TOC signing off.    Final next level of care: Skilled Nursing Facility Barriers to Discharge: Barriers Resolved  Patient Goals and CMS Choice CMS Medicare.gov Compare Post Acute Care list provided to:: Patient Choice offered to / list presented to : Patient  Discharge Placement Existing PASRR number confirmed : 12/15/22          Patient chooses bed at: [facility] Patient to be transferred to facility by: [facility] Name of family member notified: [name] (husband) Patient and family notified of of transfer: 12/17/22  Discharge Plan and Services Additional resources added to the After Visit Summary for   In-house Referral: Clinical Social Work Post Acute Care Choice: [facility]          DME Arranged: N/A DME Agency: NA  Social Determinants of Health ([place]) Interventions [place]: No Food Insecurity (12/11/2022)  Housing: Low Risk  (12/11/2022)  Transportation Needs: No Transportation Needs (12/11/2022)  Utilities: Not At Risk (12/11/2022)  Tobacco Use: Medium Risk (12/12/2022)   Readmission Risk Interventions    11/06/2022    9:37 AM  Readmission Risk Prevention Plan  Transportation Screening Complete  PCP or Specialist Appt within 3-5 Days Complete  HRI or [place]  Complete  Social Work Consult for [place] Planning/Counseling Complete  Palliative Care Screening Not Applicable  Medication Review [employer]) Complete

## 2022-12-17 NOTE — Progress Notes (Signed)
Lower extremity venous duplex has been completed.   Preliminary results in CV Proc.   Jinny Blossom Ad Guttman 12/17/2022 8:53 AM

## 2022-12-17 NOTE — Discharge Summary (Signed)
Physician Discharge Summary   Patient: Norma Anthony MRN: [id number] DOB: 1956/03/28  Admit date:     12/11/2022  Discharge date: 12/17/22  Discharge Physician: Alma Friendly   PCP: Clinic, [facility]   Recommendations at discharge:   Follow-up with PCP in 1 week Follow-up with orthopedics as scheduled  Discharge Diagnoses: Principal Problem:   Wound dehiscence, surgical, initial encounter Active Problems:   Patellar dislocation   Chronic respiratory failure with hypoxia (HCC)   COPD (chronic obstructive pulmonary disease) (HCC)   GERD without esophagitis   Generalized anxiety disorder   Patellar dislocation, left, initial encounter    Hospital Course: 39 PMH of COPD, chronic respiratory failure on 2 LPM, fibromyalgia, PE, DVT SP IVC filter now on Xarelto, OSA, GERD present to the hospital after a fall. History of left TKA 11/22. CT of the knee was obtained revealing a patellar rupture.  Underwent irrigation and debridement of left knee and complex closure of traumatic arthrotomy on 12/29  Assessment and Plan: History of right TKA Wound dehiscence and patellar dislocation after a fall Underwent excisional debridement and complex closure. Continue pain control.  Add scheduled Tylenol   Fever Noted temp spike on 12/15/22, no leukocytes, asymptomatic UA unremarkable, UC insignificant growth CXR unremarkable BC X 2 NGTD Monitor off antibiotics   Bilateral lower extremity edema BLE doppler showed chronic DVT Continue lasix Continue Xarelto   Acute blood loss anemia Baseline hemoglobin around 14.  After TKA hemoglobin dropped down to around 11.  Hemoglobin now stable around    COPD OSA Chronic respiratory failure with hypoxia Stable   GERD Continue PPI   Generalized anxiety On Xanax   Obesity Placing the patient at high risk of poor outcome. Body mass index is 37.12 kg/m.    HTN BP stable   Constipation Continue bowel  regimen.      Consultants: Orthopedics Procedures performed: Surgery as above Disposition: Skilled nursing facility Diet recommendation:  Cardiac diet DISCHARGE MEDICATION: Allergies as of 12/17/2022       Reactions   Lyrica [pregabalin] Other (See Comments)   Paralysis present (finding)- not tolerated Pt stated she could not move her body but she could think    Belbuca [buprenorphine Hcl] Other (See Comments)   " Horrible headache"    Compazine [prochlorperazine] Anxiety   Droperidol Anxiety   Ergotamine-caffeine Anxiety, Hypertension   Increased systolic arterial pressure (finding)        Medication List     STOP taking these medications    benzonatate 100 MG capsule Commonly known as: TESSALON   docusate sodium 100 MG capsule Commonly known as: Colace       TAKE these medications    albuterol 108 (90 Base) MCG/ACT inhaler Commonly known as: VENTOLIN HFA Inhale 2 puffs into the lungs every 6 (six) hours as needed for wheezing or shortness of breath.   ALPRAZolam 1 MG tablet Commonly known as: XANAX Take 1-4 tablets (1-4 mg total) by mouth See admin instructions for 3 days. Take 3-4 mg by mouth at bedtime as needed for sleep or anxiety and an additional 1 mg once a day as needed for anxiety   aspirin EC 81 MG tablet Take 81 mg by mouth daily. Swallow whole.   b complex vitamins capsule Take 1 capsule by mouth daily.   clindamycin-benzoyl peroxide gel Commonly known as: BENZACLIN Apply 1 Application topically 2 (two) times daily as needed (for acne breakouts).   clotrimazole-betamethasone cream Commonly known as: LOTRISONE Apply 1 Application  topically 2 (two) times daily as needed (for irritation under the breasts or abdominal folds).   DULoxetine 60 MG capsule Commonly known as: CYMBALTA Take 60 mg by mouth at bedtime.   famotidine 20 MG tablet Commonly known as: Pepcid One after supper What changed:  how much to take how to take this when  to take this additional instructions   Ferrous Fumarate-Folic Acid 341-9 MG Tabs Take 1 tablet by mouth daily. Hematinic   furosemide 20 MG tablet Commonly known as: LASIX Take 1 tablet (20 mg total) by mouth daily.   gabapentin 400 MG capsule Commonly known as: NEURONTIN Take 400 mg by mouth 3 (three) times daily.   melatonin 3 MG Tabs tablet Take 3 mg by mouth at bedtime.   MULTIVITAMIN GUMMIES ADULT PO Take 1 tablet by mouth daily. With Zinc and vitamin C   naloxone 4 MG/0.1ML Liqd nasal spray kit Commonly known as: NARCAN Place 1 spray into the nose once as needed (accidental over dose).   Oxycodone HCl 10 MG Tabs Take 1 tablet (10 mg total) by mouth every 8 (eight) hours for 3 days. What changed: Another medication with the same name was removed. Continue taking this medication, and follow the directions you see here.   pantoprazole 40 MG tablet Commonly known as: PROTONIX Take 40 mg by mouth daily before breakfast.   polyethylene glycol 17 g packet Commonly known as: MIRALAX / GLYCOLAX Take 17 g by mouth daily. What changed:  when to take this reasons to take this   promethazine 25 MG tablet Commonly known as: PHENERGAN Take 25 mg by mouth every 6 (six) hours as needed for nausea or vomiting.   Norma-docusate 8.6-50 MG tablet Commonly known as: Senokot-S Take 2 tablets by mouth at bedtime for 15 days.   tiZANidine 2 MG tablet Commonly known as: ZANAFLEX Take 1 tablet (2 mg total) by mouth every 12 (twelve) hours as needed for up to 3 days for muscle spasms.   Vitamin D3 1.25 MG (50000 UT) Caps Take 50,000 Units by mouth every Wednesday.   Xarelto 20 MG Tabs tablet Generic drug: rivaroxaban Take 20 mg by mouth at bedtime.        Contact information for after-discharge care     Destination     [facility] Preferred SNF .   Service: Skilled Nursing Contact information: [address]  [place] [phone]                    Discharge Exam: Danley Danker Weights   12/11/22 1514  Weight: 104.3 kg   General: NAD  Cardiovascular: S1, S2 present Respiratory: CTAB Abdomen: Soft, nontender, nondistended, bowel sounds present Musculoskeletal: bilateral pedal edema noted, RLE dressing noted Skin: Normal Psychiatry: Normal mood   Condition at discharge: stable  The results of significant diagnostics from this hospitalization (including imaging, microbiology, ancillary and laboratory) are listed below for reference.   Imaging Studies: VAS [place] LOWER EXTREMITY VENOUS (DVT)  Result Date: 12/17/2022  Lower Venous DVT Study Patient Name:  LYNDIA BURY  Date of Exam:   12/17/2022 Medical Rec #: [id number]   Accession #:    [id number] Date of Birth: 05-23-1956   Patient Gender: F Patient Age:   67 years Exam Location:  [facility] Procedure:      VAS [place] LOWER EXTREMITY VENOUS (DVT) Referring Phys: [facility] EZENDUKA --------------------------------------------------------------------------------  Indications: Swelling, and Edema.  Comparison Study: 09/07/22 prior Performing Technologist: Jinny Blossom  [facility] RVS  Examination Guidelines: A complete evaluation includes B-mode imaging, spectral Doppler, color Doppler, and power Doppler as needed of all accessible portions of each vessel. Bilateral testing is considered an integral part of a complete examination. Limited examinations for reoccurring indications may be performed as noted. The reflux portion of the exam is performed with the patient in reverse Trendelenburg.  +---------+---------------+---------+-----------+----------+--------------+ RIGHT    CompressibilityPhasicitySpontaneityPropertiesThrombus Aging +---------+---------------+---------+-----------+----------+--------------+ CFV      Full           Yes      Yes                                 +---------+---------------+---------+-----------+----------+--------------+  SFJ      Full                                                        +---------+---------------+---------+-----------+----------+--------------+ FV Prox  Full                                                        +---------+---------------+---------+-----------+----------+--------------+ FV Mid   Full                                                        +---------+---------------+---------+-----------+----------+--------------+ FV DistalFull           Yes      Yes                                 +---------+---------------+---------+-----------+----------+--------------+ PFV      Full                                                        +---------+---------------+---------+-----------+----------+--------------+ POP      Partial        Yes      Yes                  Chronic        +---------+---------------+---------+-----------+----------+--------------+ PTV      Full                                                        +---------+---------------+---------+-----------+----------+--------------+ PERO     Full           Yes      Yes                                 +---------+---------------+---------+-----------+----------+--------------+   +---------+---------------+---------+-----------+----------+--------------+ LEFT  CompressibilityPhasicitySpontaneityPropertiesThrombus Aging +---------+---------------+---------+-----------+----------+--------------+ CFV      Full           Yes      Yes                                 +---------+---------------+---------+-----------+----------+--------------+ SFJ      Full                                                        +---------+---------------+---------+-----------+----------+--------------+ FV Prox  Full                                                        +---------+---------------+---------+-----------+----------+--------------+ FV Mid   Full                                                         +---------+---------------+---------+-----------+----------+--------------+ FV DistalFull                                                        +---------+---------------+---------+-----------+----------+--------------+ PFV      Full                                                        +---------+---------------+---------+-----------+----------+--------------+ POP      Full           Yes      Yes                                 +---------+---------------+---------+-----------+----------+--------------+ PTV      Full                                                        +---------+---------------+---------+-----------+----------+--------------+ PERO     Full                                                        +---------+---------------+---------+-----------+----------+--------------+     Summary: RIGHT: - Findings consistent with chronic deep vein thrombosis involving the right popliteal vein. - No cystic structure found in the popliteal fossa.  LEFT: - There is no evidence of deep vein thrombosis in the lower extremity.  - No cystic structure found in the popliteal  fossa.  *See table(s) above for measurements and observations.    Preliminary    DG CHEST PORT 1 VIEW  Result Date: 12/16/2022 CLINICAL DATA:  Fever. EXAM: PORTABLE CHEST 1 VIEW COMPARISON:  08/27/2022 and CT chest 12/11/2022. FINDINGS: Trachea is midline. Heart is enlarged. Lungs are clear. No pleural fluid. Moderate hiatal hernia. IMPRESSION: 1. No acute findings. 2. Moderate hiatal hernia. Electronically Signed   By: Lorin Picket M.D.   On: 12/16/2022 08:49   DG Knee 1-2 Views Left  Result Date: 12/11/2022 CLINICAL DATA:  Dislocation of patella EXAM: LEFT KNEE - 1-2 VIEW COMPARISON:  CT done on 12/11/2022 FINDINGS: There is previous left knee arthroplasty. There is interval reduction of dislocated patella. There are pockets of air in the soft tissues along the anterior  aspect. Skin staples are seen anteriorly. IMPRESSION: Surgical reduction of patellar dislocation. Previous left knee arthroplasty. Electronically Signed   By: Elmer Picker M.D.   On: 12/11/2022 19:12   CT Knee Left Wo Contrast  Result Date: 12/11/2022 CLINICAL DATA:  Left knee pain, left knee surgery 11/04/2022. EXAM: CT OF THE LEFT KNEE WITHOUT CONTRAST TECHNIQUE: Multidetector CT imaging of the left knee was performed according to the standard protocol. Multiplanar CT image reconstructions were also generated. RADIATION DOSE REDUCTION: This exam was performed according to the departmental dose-optimization program which includes automated exposure control, adjustment of the mA and/or kV according to patient size and/or use of iterative reconstruction technique. COMPARISON:  None Available. FINDINGS: Bones/Joint/Cartilage Left total knee arthroplasty. Beam hardening artifact resulting from the orthopedic hardware obscures the adjacent soft tissue and osseous structures. No acute fracture. Lateral dislocation of the patella which continues to be perched on the lateral trochlea. Large hemarthrosis. Ligaments Ligaments are suboptimally evaluated by CT. Muscles and Tendons Patellar tendon and quadriceps tendon are thickened and ill-defined and difficult to determine whether these are postsurgical changes versus an underlying tear. Soft tissue No fluid collection or hematoma. No soft tissue mass. Postsurgical changes in the anterior knee soft tissues. IMPRESSION: 1. Left total knee arthroplasty. Beam hardening artifact resulting from the orthopedic hardware obscures the adjacent soft tissue and osseous structures. 2. Lateral dislocation of the patella which continues to be perched on the lateral trochlea. 3. Patellar tendon and quadriceps tendon are thickened and ill-defined and difficult to determine whether these are postsurgical changes versus an underlying tear. Further characterization with an MRI of  the knee with a mars protocol may be helpful. There is too much artifact resulting from the orthopedic hardware, an ultrasound of the knee for evaluation of the quadriceps tendon and patellar tendon would be recommended. 4. Large hemarthrosis. Electronically Signed   By: Kathreen Devoid M.D.   On: 12/11/2022 08:11   CT CHEST WO CONTRAST  Result Date: 12/11/2022 CLINICAL DATA:  67 year old female with recent left knee surgery. Sharp pain "heard a pop" and fell. EXAM: CT CHEST WITHOUT CONTRAST TECHNIQUE: Multidetector CT imaging of the chest was performed following the standard protocol without IV contrast. RADIATION DOSE REDUCTION: This exam was performed according to the departmental dose-optimization program which includes automated exposure control, adjustment of the mA and/or kV according to patient size and/or use of iterative reconstruction technique. COMPARISON:  Cervical spine CT today.  Chest CT 03/23/2022. FINDINGS: Cardiovascular: Calcified aortic atherosclerosis. Stable cardiac size at the upper limits of normal. No pericardial effusion. Vascular patency is not evaluated in the absence of IV contrast. Mediastinum/Nodes: Moderate to large gastric hiatal hernia with surrounding surgical clips, staple line  is stable since April. No superimposed mediastinal mass or lymphadenopathy. Lungs/Pleura: Lower lung volumes compared to April with some atelectatic changes to the major airways which remain patent. Streaky bilateral peribronchial mostly sub solid opacity predominantly in the upper lobes (series 4, image 46) with underlying mosaic attenuation. Lower lobe mild and more typical dependent atelectasis appearance. No pleural effusion. No consolidation. Middle lobes appear negative. Upper Abdomen: Chronic postoperative changes about the distal intra-abdominal stomach also redemonstrated. Previous ventral abdominal hernia repair with mesh only minimally visible today over the left liver contour. Visible  noncontrast liver, spleen, pancreas, left adrenal gland, and splenic flexure appears stable and negative. Musculoskeletal: Osteopenia. Motion artifact at the mid sternum and affecting bilateral rib detail. No convincing No acute osseous abnormality identified. IMPRESSION: 1. Mild motion artifact.  No acute traumatic injury identified. 2. Lower lung volumes with nonspecific streaky bilateral upper lobe opacity. But favor a combination of gas trapping and atelectasis rather than acute viral/atypical respiratory infection. No pleural effusion or other acute pulmonary abnormality. 3. Chronic moderate to large gastric hiatal hernia with postoperative changes surrounding the stomach. 4.  Aortic Atherosclerosis (ICD10-I70.0). Electronically Signed   By: Genevie Ann M.D.   On: 12/11/2022 06:11   CT Cervical Spine Wo Contrast  Result Date: 12/11/2022 CLINICAL DATA:  67 year old female with recent left knee surgery. Sharp pain "heard a pop" and fell. EXAM: CT CERVICAL SPINE WITHOUT CONTRAST TECHNIQUE: Multidetector CT imaging of the cervical spine was performed without intravenous contrast. Multiplanar CT image reconstructions were also generated. RADIATION DOSE REDUCTION: This exam was performed according to the departmental dose-optimization program which includes automated exposure control, adjustment of the mA and/or kV according to patient size and/or use of iterative reconstruction technique. COMPARISON:  Cervical spine CT 11/20/2022.  Head CT today. FINDINGS: Alignment: Stable exaggerated cervical lordosis. Cervicothoracic junction alignment is within normal limits. Bilateral posterior element alignment is within normal limits. Skull base and vertebrae: Osteopenia. Visualized skull base is intact. No atlanto-occipital dissociation. C1 and C2 appear intact and aligned. Intermittent mild motion artifact. No acute osseous abnormality identified. Soft tissues and spinal canal: No prevertebral fluid or swelling. No visible  canal hematoma. Negative visible noncontrast neck soft tissues. Disc levels:  Stable mild for age cervical spine degeneration. Upper chest: Dedicated chest CT today is reported separately. IMPRESSION: 1. Mild motion artifact. No acute traumatic injury identified in the cervical spine. 2. Osteopenia.  Mild for age cervical spine degeneration. Electronically Signed   By: Genevie Ann M.D.   On: 12/11/2022 06:06   CT HEAD WO CONTRAST (5MM)  Result Date: 12/11/2022 CLINICAL DATA:  67 year old female with recent left knee surgery. Sharp pain "heard a pop" and fell. EXAM: CT HEAD WITHOUT CONTRAST TECHNIQUE: Contiguous axial images were obtained from the base of the skull through the vertex without intravenous contrast. RADIATION DOSE REDUCTION: This exam was performed according to the departmental dose-optimization program which includes automated exposure control, adjustment of the mA and/or kV according to patient size and/or use of iterative reconstruction technique. COMPARISON:  Head CT 11/20/2022. FINDINGS: Brain: Cerebral volume is within normal limits for age. No midline shift, ventriculomegaly, mass effect, evidence of mass lesion, intracranial hemorrhage or evidence of cortically based acute infarction. Gray-white matter differentiation is within normal limits throughout the brain. Faint asymmetric left basal ganglia vascular calcifications are stable. Vascular: No suspicious intracranial vascular hyperdensity. Skull: Stable osteopenia.  No acute osseous abnormality identified. Sinuses/Orbits: Visualized paranasal sinuses and mastoids are clear. Other: No orbit or scalp soft  tissue injury identified. IMPRESSION: No acute traumatic injury identified. Stable and normal for age non contrast CT appearance of the brain. Electronically Signed   By: Genevie Ann M.D.   On: 12/11/2022 05:59   CT Lumbar Spine Wo Contrast  Result Date: 11/20/2022 CLINICAL DATA:  Back trauma.  Fall this morning. EXAM: CT LUMBAR SPINE WITHOUT  CONTRAST TECHNIQUE: Multidetector CT imaging of the lumbar spine was performed without intravenous contrast administration. Multiplanar CT image reconstructions were also generated. RADIATION DOSE REDUCTION: This exam was performed according to the departmental dose-optimization program which includes automated exposure control, adjustment of the mA and/or kV according to patient size and/or use of iterative reconstruction technique. COMPARISON:  CT abdomen and pelvis without contrast 04/19/2022 FINDINGS: Segmentation: There are 5 non-rib-bearing lumbar-type vertebral bodies. Alignment: 3 mm grade 1 anterolisthesis of L5 on S1 is unchanged from prior. No pars defect is seen, and this is likely developmental. 2 mm retrolisthesis of L3 on L4, unchanged. Vertebrae: Mild anterior wedging of the T12 vertebral body is unchanged from 04/19/2022 radiographs and chronic. This may be developmental. There is unchanged relative lucency within the mid transverse, far posterior L1 vertebral body (sagittal series 5, image 37, axial series 3, image 30) which is unchanged from prior and may represent the patient's normal vascular pedicle versus a small lucent hemangioma. Moderate posterosuperior L1 vertebral body concave degenerative Schmorl's nodes are unchanged. Mild right-greater-than-left T11-12, moderate left L3-4, and moderate to severe diffuse L5-S1 disc space narrowing. Endplate sclerosis, cystic changes, and vacuum phenomenon are greatest at L3-4 and L5-S1. No acute fracture is seen. 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 Mild atherosclerotic calcifications. The kidneys are partially visualized. Right lower pole 3 mm and 4 mm stones are minimally increased in size from 2 mm and 3 mm, respectively on 04/19/2022 CT. No hydronephrosis within either kidney. Disc levels: T12-L1: Mild bilateral facet joint hypertrophy. Mild broad-based  posterior disc osteophyte complex. Mild right lateral recess narrowing. No significant central canal stenosis. No neuroforaminal stenosis. L1-2: Mild to moderate right and mild left facet joint hypertrophy. Mild right neuroforaminal narrowing. Mild peripheral left broad-based posterior disc osteophyte complex. No significant central canal stenosis. L2-3: Mild-to-moderate bilateral facet joint hypertrophy. Mild broad-based posterior disc osteophyte complex. Mild left neuroforaminal narrowing. Mild central canal stenosis. L3-4: Mild-to-moderate bilateral facet joint hypertrophy. Mild broad-based posterior disc osteophyte complex with moderate left intraforaminal extension. Mild-to-moderate left neuroforaminal stenosis. Mild narrowing of the lateral recesses. No central canal stenosis. L4-5: Moderate left-greater-than-right facet joint hypertrophy and ligamentum flavum hypertrophy. Mild broad-based posterior disc bulge. Very mild bilateral neuroforaminal narrowing without true stenosis. Mild-to-moderate central canal stenosis. L5-S1: Moderate left-greater-than-right facet joint hypertrophy. Moderate broad-based posterior disc osteophyte complex with moderate bilateral intraforaminal extension. Moderate to severe bilateral neuroforaminal stenosis, similar to prior. Mild narrowing of lateral recesses. Mild central canal stenosis. IMPRESSION: 1. No acute fracture of the lumbar spine. 2. Multilevel degenerative disc and joint changes as above. 3. L5-S1 moderate to severe bilateral neuroforaminal stenosis. 4. L4-5 very mild bilateral neuroforaminal narrowing. 5. L3-4 mild-to-moderate left neuroforaminal stenosis. 6. L2-3 mild left neuroforaminal narrowing. 7. Mild-to-moderate L4-5 and mild L5-S1 central canal stenosis. Electronically Signed   By: Yvonne Kendall M.D.   On: 11/20/2022 12:28   CT Head Wo Contrast  Result Date: 11/20/2022 CLINICAL DATA:  Head trauma. Status post fall. Patient is on Xarelto. EXAM: CT HEAD  WITHOUT CONTRAST CT CERVICAL  SPINE WITHOUT CONTRAST TECHNIQUE: Multidetector CT imaging of the head and cervical spine was performed following the standard protocol without intravenous contrast. Multiplanar CT image reconstructions of the cervical spine were also generated. RADIATION DOSE REDUCTION: This exam was performed according to the departmental dose-optimization program which includes automated exposure control, adjustment of the mA and/or kV according to patient size and/or use of iterative reconstruction technique. COMPARISON:  None Available. FINDINGS: CT HEAD FINDINGS Brain: No evidence of acute infarction, hemorrhage, hydrocephalus, extra-axial collection or mass lesion/mass effect. Vascular: No hyperdense vessel or unexpected calcification. Skull: Normal. Negative for fracture or focal lesion. Sinuses/Orbits: The paranasal sinuses and the mastoid air cells are clear. Other: None. CT CERVICAL SPINE FINDINGS Alignment: Normal alignment of the cervical spine. Skull base and vertebrae: No acute fracture. No primary bone lesion or focal pathologic process. Soft tissues and spinal canal: No prevertebral fluid or swelling. No visible canal hematoma. Disc levels: Disc space narrowing and endplate spurring is noted at C4-5, C5-6 and C6-7. Upper chest: No acute findings. Nodule arising off the posterior right lobe of thyroid gland measures 2.1 x 1.4 cm, image 73/4. This has been evaluated on previous imaging. (ref: J Am Coll Radiol. 2015 Feb;12(2): 143-50). Other: None IMPRESSION: 1. No acute intracranial abnormality. 2. No evidence for cervical spine fracture or subluxation. 3. Cervical degenerative disc disease. Electronically Signed   By: Kerby Moors M.D.   On: 11/20/2022 12:17   CT Cervical Spine Wo Contrast  Result Date: 11/20/2022 CLINICAL DATA:  Head trauma. Status post fall. Patient is on Xarelto. EXAM: CT HEAD WITHOUT CONTRAST CT CERVICAL SPINE WITHOUT CONTRAST TECHNIQUE: Multidetector CT imaging  of the head and cervical spine was performed following the standard protocol without intravenous contrast. Multiplanar CT image reconstructions of the cervical spine were also generated. RADIATION DOSE REDUCTION: This exam was performed according to the departmental dose-optimization program which includes automated exposure control, adjustment of the mA and/or kV according to patient size and/or use of iterative reconstruction technique. COMPARISON:  None Available. FINDINGS: CT HEAD FINDINGS Brain: No evidence of acute infarction, hemorrhage, hydrocephalus, extra-axial collection or mass lesion/mass effect. Vascular: No hyperdense vessel or unexpected calcification. Skull: Normal. Negative for fracture or focal lesion. Sinuses/Orbits: The paranasal sinuses and the mastoid air cells are clear. Other: None. CT CERVICAL SPINE FINDINGS Alignment: Normal alignment of the cervical spine. Skull base and vertebrae: No acute fracture. No primary bone lesion or focal pathologic process. Soft tissues and spinal canal: No prevertebral fluid or swelling. No visible canal hematoma. Disc levels: Disc space narrowing and endplate spurring is noted at C4-5, C5-6 and C6-7. Upper chest: No acute findings. Nodule arising off the posterior right lobe of thyroid gland measures 2.1 x 1.4 cm, image 73/4. This has been evaluated on previous imaging. (ref: J Am Coll Radiol. 2015 Feb;12(2): 143-50). Other: None IMPRESSION: 1. No acute intracranial abnormality. 2. No evidence for cervical spine fracture or subluxation. 3. Cervical degenerative disc disease. Electronically Signed   By: Kerby Moors M.D.   On: 11/20/2022 12:17    Microbiology: Results for orders placed or performed during the hospital encounter of 12/11/22  Urine Culture     Status: Abnormal   Collection Time: 12/16/22  9:22 AM   Specimen: Urine, Clean Catch  Result Value Ref Range Status   Specimen Description   Final    URINE, CLEAN CATCH Performed at [facility], [place] [address], [place],  [address]    Special Requests  Final    NONE Performed at [facility], [place] [address] [place], [place] [address]    Culture (A)  Final    <10,000 COLONIES/mL INSIGNIFICANT GROWTH Performed at [facility] [address], [place], [place] [address]    Report Status 12/17/2022 FINAL  Final  Culture, blood (Routine X 2) w Reflex to ID Panel     Status: None (Preliminary result)   Collection Time: 12/16/22  9:25 PM   Specimen: BLOOD  Result Value Ref Range Status   Specimen Description   Final    BLOOD BLOOD LEFT ARM Performed at [facility] [address], [place], [place] [address]    Special Requests   Final    BOTTLES DRAWN AEROBIC ONLY Blood Culture adequate volume Performed at [facility] [address], [place], [place] [address]    Culture   Final    NO GROWTH < 12 HOURS Performed at [facility] [address], [place], [place] [address]    Report Status PENDING  Incomplete  Culture, blood (Routine X 2) w Reflex to ID Panel     Status: None (Preliminary result)   Collection Time: 12/16/22  9:25 PM   Specimen: BLOOD  Result Value Ref Range Status   Specimen Description   Final    BLOOD BLOOD RIGHT HAND Performed at [facility] [address], [place], [place] [address]    Special Requests   Final    BOTTLES DRAWN AEROBIC ONLY Blood Culture adequate volume Performed at [facility] [address], [place], [place] [address]    Culture   Final    NO GROWTH < 12 HOURS Performed at [facility] [address] [place], [place] [address]    Report Status PENDING  Incomplete    Labs: CBC: Recent Labs  Lab 12/11/22 0505 12/13/22 0537 12/14/22 1105 12/15/22 0449 12/17/22 0425  WBC 9.7 8.9 7.3 8.0 6.6  NEUTROABS 6.5  --   --   --  3.5  HGB 11.4* 8.4* 9.1* 9.6* 9.2*  HCT 37.6 27.9* 29.9*  31.6* 30.6*  MCV 103.0* 103.7* 103.1* 103.9* 102.3*  PLT 194 192 190 186 093   Basic Metabolic Panel: Recent Labs  Lab 12/11/22 0505 12/12/22 0628 12/13/22 0537 12/15/22 0449 12/17/22 0425  NA 141 139 142 136 140  K 3.5 4.4 3.8 3.7 3.4*  CL 108 104 105 99 104  CO2 25 28 32 29 27  GLUCOSE 117* 172* 105* 108* 107*  BUN _0 CREATININE 0.83 0.66 0.69 0.69 0.67  CALCIUM 8.9 8.7* 8.3* 8.5* 8.6*  MG  --  1.8  --  2.1  --    Liver Function Tests: Recent Labs  Lab 12/11/22 0505 12/12/22 0628 12/13/22 0537  AST _1 ALT _2 ALKPHOS 89 82 68  BILITOT 0.5 0.6 0.5  PROT 6.4* 6.1* 5.8*  ALBUMIN 2.8* 2.8* 2.8*   CBG: No results for input(s): "GLUCAP" in the last 168 hours.  Discharge time spent: greater than 30 minutes.  Signed: Alma Friendly, MD [facility] 12/17/2022

## 2022-12-21 LAB — CULTURE, BLOOD (ROUTINE X 2)
Culture: NO GROWTH
Culture: NO GROWTH
Special Requests: ADEQUATE
Special Requests: ADEQUATE

## 2022-12-29 ENCOUNTER — Telehealth: Payer: Self-pay | Admitting: Family Medicine

## 2022-12-29 NOTE — Telephone Encounter (Signed)
Patient would like the nurse to call regarding an appt. For her a home sleep study.  She stated she just got out of the hospital and wanted to know if she should have the study so soon.  Please advise and call patient to discuss.  Patient stated that the nurse can leave a VM.  # [phone]

## 2022-12-29 NOTE — Telephone Encounter (Signed)
Epic shows that the HST was done 11/20/22 and her husband was picking up the machine but she also went to the ED the same day.  Will they need new HST order placed since the old order has been attached as completed

## 2022-12-29 NOTE — Telephone Encounter (Signed)
Called patient but she did not answer. Left message for patient to call back.  

## 2022-12-29 NOTE — Telephone Encounter (Signed)
Patient missed the call and would like a call back as to what she should do.  She stated that the nurse can leave a detailed message as well.  CB# [phone]

## 2023-01-04 ENCOUNTER — Ambulatory Visit (HOSPITAL_COMMUNITY)
Admission: RE | Admit: 2023-01-04 | Payer: TRICARE For Life (TFL) | Source: Home / Self Care | Admitting: Vascular Surgery

## 2023-01-04 ENCOUNTER — Ambulatory Visit (HOSPITAL_COMMUNITY): Payer: Medicare Other

## 2023-01-04 ENCOUNTER — Encounter (HOSPITAL_COMMUNITY): Admission: RE | Payer: Self-pay | Source: Home / Self Care

## 2023-01-04 SURGERY — IVC FILTER REMOVAL
Anesthesia: LOCAL

## 2023-01-18 ENCOUNTER — Encounter (HOSPITAL_COMMUNITY): Payer: Self-pay | Admitting: Vascular Surgery

## 2023-01-18 ENCOUNTER — Other Ambulatory Visit: Payer: Self-pay

## 2023-01-18 ENCOUNTER — Encounter (HOSPITAL_COMMUNITY)
Admission: RE | Disposition: A | Discharge status: Home / Self Care | Payer: Self-pay | Source: Home / Self Care | Attending: Vascular Surgery

## 2023-01-18 ENCOUNTER — Ambulatory Visit (HOSPITAL_COMMUNITY)
Admission: RE | Admit: 2023-01-18 | Discharge: 2023-01-18 | Disposition: A | Discharge status: Home / Self Care | Payer: Medicare Other | Attending: Vascular Surgery | Admitting: Vascular Surgery

## 2023-01-18 DIAGNOSIS — Z7901 Long term (current) use of anticoagulants: Secondary | ICD-10-CM | POA: Diagnosis not present

## 2023-01-18 DIAGNOSIS — Z86718 Personal history of other venous thrombosis and embolism: Secondary | ICD-10-CM

## 2023-01-18 DIAGNOSIS — Z832 Family history of diseases of the blood and blood-forming organs and certain disorders involving the immune mechanism: Secondary | ICD-10-CM | POA: Insufficient documentation

## 2023-01-18 DIAGNOSIS — Z8249 Family history of ischemic heart disease and other diseases of the circulatory system: Secondary | ICD-10-CM | POA: Diagnosis not present

## 2023-01-18 DIAGNOSIS — Z87891 Personal history of nicotine dependence: Secondary | ICD-10-CM | POA: Diagnosis not present

## 2023-01-18 DIAGNOSIS — Z96652 Presence of left artificial knee joint: Secondary | ICD-10-CM | POA: Diagnosis not present

## 2023-01-18 DIAGNOSIS — Z4589 Encounter for adjustment and management of other implanted devices: Secondary | ICD-10-CM | POA: Insufficient documentation

## 2023-01-18 DIAGNOSIS — Z86711 Personal history of pulmonary embolism: Secondary | ICD-10-CM | POA: Insufficient documentation

## 2023-01-18 HISTORY — PX: IVC FILTER REMOVAL: CATH118246

## 2023-01-18 LAB — POCT I-STAT, CHEM 8
BUN: 11 mg/dL (ref 8–23)
Calcium, Ion: 1.13 mmol/L — ABNORMAL LOW (ref 1.15–1.40)
Chloride: 105 mmol/L (ref 98–111)
Creatinine, Ser: 0.8 mg/dL (ref 0.44–1.00)
Glucose, Bld: 85 mg/dL (ref 70–99)
HCT: 38 % (ref 36.0–46.0)
Hemoglobin: 12.9 g/dL (ref 12.0–15.0)
Potassium: 4.2 mmol/L (ref 3.5–5.1)
Sodium: 143 mmol/L (ref 135–145)
TCO2: 29 mmol/L (ref 22–32)

## 2023-01-18 SURGERY — IVC FILTER REMOVAL
Anesthesia: LOCAL

## 2023-01-18 MED ORDER — MIDAZOLAM HCL 2 MG/2ML IJ SOLN
INTRAMUSCULAR | Status: AC
  Filled 2023-01-18: qty 2

## 2023-01-18 MED ORDER — FENTANYL CITRATE (PF) 100 MCG/2ML IJ SOLN
INTRAMUSCULAR | Status: DC | PRN
  Administered 2023-01-18: 100 ug via INTRAVENOUS

## 2023-01-18 MED ORDER — HEPARIN (PORCINE) IN NACL 1000-0.9 UT/500ML-% IV SOLN
INTRAVENOUS | Status: AC
  Filled 2023-01-18: qty 500

## 2023-01-18 MED ORDER — FENTANYL CITRATE (PF) 100 MCG/2ML IJ SOLN
INTRAMUSCULAR | Status: AC
  Filled 2023-01-18: qty 2

## 2023-01-18 MED ORDER — MIDAZOLAM HCL 2 MG/2ML IJ SOLN
INTRAMUSCULAR | Status: DC | PRN
  Administered 2023-01-18: 1 mg via INTRAVENOUS

## 2023-01-18 MED ORDER — LIDOCAINE HCL (PF) 1 % IJ SOLN
INTRAMUSCULAR | Status: AC
  Filled 2023-01-18: qty 30

## 2023-01-18 MED ORDER — HEPARIN (PORCINE) IN NACL 1000-0.9 UT/500ML-% IV SOLN
INTRAVENOUS | Status: DC | PRN
  Administered 2023-01-18: 500 mL

## 2023-01-18 MED ORDER — IODIXANOL 320 MG/ML IV SOLN
INTRAVENOUS | Status: DC | PRN
  Administered 2023-01-18: 15 mL

## 2023-01-18 MED ORDER — SODIUM CHLORIDE 0.9 % IV SOLN: INTRAVENOUS | Status: DC

## 2023-01-18 MED ORDER — LIDOCAINE HCL (PF) 1 % IJ SOLN
INTRAMUSCULAR | Status: DC | PRN
  Administered 2023-01-18: 10 mL

## 2023-01-18 SURGICAL SUPPLY — 11 items
KIT MICROPUNCTURE NIT STIFF (SHEATH) IMPLANT
KIT PV (KITS) ×2 IMPLANT
PROTECTION STATION PRESSURIZED (MISCELLANEOUS) ×1
SET CLOVERSNARE FLT RETRIEVAL (MISCELLANEOUS) IMPLANT
SHEATH PINNACLE 8F 10CM (SHEATH) IMPLANT
SHEATH PROBE COVER 6X72 (BAG) IMPLANT
STATION PROTECTION PRESSURIZED (MISCELLANEOUS) IMPLANT
SYR MEDRAD MARK 7 150ML (SYRINGE) ×2 IMPLANT
TRANSDUCER W/STOPCOCK (MISCELLANEOUS) ×2 IMPLANT
TRAY PV CATH (CUSTOM PROCEDURE TRAY) ×2 IMPLANT
WIRE BENTSON .035X145CM (WIRE) IMPLANT

## 2023-01-18 NOTE — Op Note (Signed)
    Patient name: Norma Anthony MRN: [id number] DOB: Dec 12, 1956 Sex: female  01/18/2023 Pre-operative Diagnosis: presence of IVC filter, history of DVT Post-operative diagnosis:  Same Surgeon:  Eda Paschal. Donzetta Matters, MD Procedure Performed: 1.  Ultrasound-guided cannulation right internal jugular vein 2.  Removal of IVC filter 3.  Central venogram 4.  Moderate sedation with fentanyl and Versed for 15 minutes    Indications: 67 year old female with history of IVC filter placement for total knee arthroplasty which she is now recovering from the most recent being on the left.  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  Xarelto has been held for 48 hours with plan for IVC filter removal today.  Findings: The IVC filter hook was midline in the IVC this was easily snared filter was removed intact and central venogram demonstrated no active extravasation and patient was hemodynamically stable throughout the case.   Procedure:  The patient was identified in the holding area and taken to room 7.  The patient was then placed supine on the table and prepped and draped in the usual sterile fashion.  A time out was called.  Ultrasound was used to evaluate right internal jugular vein which was patent and compressible.  The area was anesthetized with 1% lidocaine cannulated with a micropuncture needle followed by wire and sheath.  A Bentson wire was then placed into the IVC under fluoroscopic guidance.  An ultrasound image was saved to the permanent record.  We dilated the wire tract with 8 French dilator and then placed the retrieval sheath under fluoroscopic guidance.  The filter was snared and the retrieval sheath was advanced over the filter.  The filter was removed and was noted to be fully intact.  Central venogram demonstrated no active extravasation with patent IVC.  The retrieval sheath was removed and pressure held for hemostasis obtained.   Patient tolerated procedure without any complication.   Contrast: 15cc  Sylvia Kondracki C. Donzetta Matters, MD [facility] Office: [phone] Pager: [phone]

## 2023-01-18 NOTE — H&P (Signed)
H+P    CHIEF COMPLAINT: here for ivc filter removal   HISTORY OF PRESENT ILLNESS: Norma Anthony is a 67 y.o. female referred to clinic for discussion of IVC filter placement prior to orthopedic surgery.  The patient is planned to undergo total knee arthroplasty with Dr. Maxie Better in the near future.  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  She has been maintained on anticoagulation therapy.    Past Medical History:  Diagnosis Date   Anxiety    CAD (coronary artery disease)    COPD (chronic obstructive pulmonary disease) (HCC)    Depression    Dyspnea    Fibromyalgia    GERD (gastroesophageal reflux disease)    Hiatal hernia    History of kidney stones    Migraine headache    Multinodular goiter    Nephrolithiasis    Pulmonary embolism ([place]) 1996   Sleep apnea    Thyroid nodule     Past Surgical History:  Procedure Laterality Date   ABDOMINAL HYSTERECTOMY     BIOPSY THYROID     HERNIA REPAIR     I & D EXTREMITY Left 12/11/2022   Procedure: IRRIGATION AND DEBRIDEMENT EXTREMITY;  Surgeon: Nicholes Stairs, MD;  Location: [facility];  Service: Orthopedics;  Laterality: Left;   IVC FILTER INSERTION N/A 09/28/2022   Procedure: IVC FILTER INSERTION;  Surgeon: Waynetta Sandy, MD;  Location: [facility];  Service: Cardiovascular;  Laterality: N/A;   KIDNEY STONE SURGERY     LAPAROSCOPIC NISSEN FUNDOPLICATION     PAROTIDECTOMY     TONSILLECTOMY     TOTAL KNEE ARTHROPLASTY Left 11/04/2022   Procedure: TOTAL KNEE ARTHROPLASTY;  Surgeon: Susa Day, MD;  Location: [facility];  Service: Orthopedics;  Laterality: Left;    Allergies  Allergen Reactions   Lyrica [Pregabalin] Other (See Comments)    Paralysis present (finding)- not tolerated Pt stated she could not move her body but she could think      Belbuca [Buprenorphine Hcl] Other (See Comments)    " Horrible headache"    Compazine  [Prochlorperazine] Anxiety   Droperidol Anxiety   Ergotamine-Caffeine Anxiety and Hypertension    Increased systolic arterial pressure (finding)     Prior to Admission medications   Medication Sig Start Date End Date Taking? Authorizing Provider  acetaminophen (TYLENOL) 500 MG tablet Take 1,000 mg by mouth every 6 (six) hours as needed for moderate pain.   Yes [provider]  albuterol (VENTOLIN HFA) 108 (90 Base) MCG/ACT inhaler Inhale 2 puffs into the lungs every 6 (six) hours as needed for wheezing or shortness of breath. 05/12/21  Yes [provider]  ALPRAZolam Duanne Moron) 1 MG tablet Take 1 mg by mouth every 6 (six) hours as needed for anxiety. 12/29/22  Yes [provider]  aspirin EC 81 MG tablet Take 81 mg by mouth daily. Swallow whole.   Yes [provider]  Aspirin-Acetaminophen-Caffeine (GOODY HEADACHE PO) Take 1 packet by mouth daily as needed (pain).   Yes [provider]  b complex vitamins capsule Take 1 capsule by mouth daily.   Yes [provider]  Cholecalciferol (VITAMIN D3) 1.25 MG (50000 UT) CAPS Take 50,000 Units by mouth every Wednesday. 02/16/22  Yes [provider]  clindamycin-benzoyl peroxide (BENZACLIN) gel Apply 1 Application topically 2 (two) times daily as needed (for acne breakouts).   Yes [provider]  clotrimazole-betamethasone (  LOTRISONE) cream Apply 1 Application topically 2 (two) times daily as needed (for irritation under the breasts or abdominal folds).   Yes [provider]  diphenhydrAMINE (SOMINEX) 25 MG tablet Take 50-75 mg by mouth daily as needed for allergies.   Yes [provider]  DULoxetine (CYMBALTA) 60 MG capsule Take 60 mg by mouth at bedtime. 09/14/18  Yes [provider]  famotidine (PEPCID) 20 MG tablet One after supper 04/30/22  Yes Tanda Rockers, MD  Ferrous Fumarate-Folic Acid 527-7 MG TABS Take 1 tablet by mouth daily. Hematinic 02/29/20  Yes  [provider]  gabapentin (NEURONTIN) 400 MG capsule Take 400 mg by mouth 3 (three) times daily. 08/21/22  Yes [provider]  melatonin 5 MG TABS Take 10 mg by mouth at bedtime.   Yes [provider]  Multiple Vitamins-Minerals (HAIR SKIN AND NAILS FORMULA) TABS Take 1 tablet by mouth daily.   Yes [provider]  Multiple Vitamins-Minerals (IMMUNE SUPPORT PO) Take 1 tablet by mouth daily.   Yes [provider]  naloxone (NARCAN) nasal spray 4 mg/0.1 mL Place 1 spray into the nose once as needed (accidental over dose). 10/16/21  Yes [provider]  Oxycodone HCl 10 MG TABS Take 10 mg by mouth 2 (two) times daily as needed (pain). 12/21/22  Yes [provider]  pantoprazole (PROTONIX) 40 MG tablet Take 40 mg by mouth daily with breakfast. 06/30/16  Yes [provider]  promethazine (PHENERGAN) 25 MG tablet Take 25 mg by mouth every 6 (six) hours as needed for nausea or vomiting.   Yes [provider]  salicylic acid-lactic acid (CORN & CALLUS REMOVER) 17 % external solution Apply 1 Application topically daily as needed (corns).   Yes [provider]  tiZANidine (ZANAFLEX) 2 MG tablet Take 2 mg by mouth every 12 (twelve) hours as needed for muscle spasms. 01/09/23  Yes [provider]  XARELTO 20 MG TABS tablet Take 20 mg by mouth at bedtime. 08/21/21  Yes [provider]  furosemide (LASIX) 20 MG tablet Take 1 tablet (20 mg total) by mouth daily. Patient not taking: Reported on 12/11/2022 08/27/22   Hendricks Limes, PA-C  polyethylene glycol (MIRALAX / GLYCOLAX) 17 g packet Take 17 g by mouth daily. Patient taking differently: Take 17 g by mouth daily as needed for mild constipation. 11/04/22   Susa Day, MD    Social History   Socioeconomic History   Marital status: Married    Spouse name: Not on file   Number of children: Not on file   Years of education: Not on file   Highest  education level: Not on file  Occupational History   Not on file  Tobacco Use   Smoking status: Former    Packs/day: 0.50    Years: 10.00    Total pack years: 5.00    Types: Cigarettes    Quit date: 02/12/2021    Years since quitting: 1.9   Smokeless tobacco: Never  Vaping Use   Vaping Use: Never used  Substance and Sexual Activity   Alcohol use: Not Currently   Drug use: Not Currently   Sexual activity: Not on file  Other Topics Concern   Not on file  Social History Narrative   Not on file   Social Determinants of Health   Financial Resource Strain: Not on file  Food Insecurity: No Food Insecurity (12/11/2022)   Hunger Vital Sign    Worried About Running Out  of Food in the Last Year: Never true    [place] in the Last Year: Never true  Transportation Needs: No Transportation Needs (12/11/2022)   PRAPARE - Hydrologist (Medical): No    Lack of Transportation (Non-Medical): No  Physical Activity: Not on file  Stress: Not on file  Social Connections: Not on file  Intimate Partner Violence: Not At Risk (12/11/2022)   Humiliation, Afraid, Rape, and Kick questionnaire    Fear of Current or Ex-Partner: No    Emotionally Abused: No    Physically Abused: No    Sexually Abused: No    Family History  Problem Relation Age of Onset   Hypertension Mother     ROS: no complaints today   Physical Examination  Vitals:   01/18/23 0622  BP: (!) 156/79  Pulse: 73  Resp: 18  Temp: 97.8 F (36.6 C)  SpO2: 95%   Body mass index is 39.77 kg/m.  Aaox3 Left knee brace in place  CBC    Component Value Date/Time   WBC 6.6 12/17/2022 0425   RBC 2.99 (L) 12/17/2022 0425   HGB 12.9 01/18/2023 0629   HCT 38.0 01/18/2023 0629   PLT 225 12/17/2022 0425   MCV 102.3 (H) 12/17/2022 0425   MCH 30.8 12/17/2022 0425   MCHC 30.1 12/17/2022 0425   RDW 14.4 12/17/2022 0425   LYMPHSABS 1.8 12/17/2022 0425   MONOABS 0.9 12/17/2022 0425   EOSABS  0.3 12/17/2022 0425   BASOSABS 0.0 12/17/2022 0425    BMET    Component Value Date/Time   NA 143 01/18/2023 0629   K 4.2 01/18/2023 0629   CL 105 01/18/2023 0629   CO2 27 12/17/2022 0425   GLUCOSE 85 01/18/2023 0629   BUN 11 01/18/2023 0629   CREATININE 0.80 01/18/2023 0629   CALCIUM 8.6 (L) 12/17/2022 0425   GFRNONAA >60 12/17/2022 0425    COAGS: Lab Results  Component Value Date   INR 1.4 (H) 12/11/2022       ASSESSMENT / PLAN: 67 y.o. female with history of DVT s/p TKA with Dr. Tonita Cong. I counseled the patient about the risks / benefits / alternatives to IVC filter removal. She will resume xarelto tomorrow.       Keven Soucy C. Donzetta Matters, MD [facility] Office: [phone] Pager: [phone]

## 2023-01-20 ENCOUNTER — Encounter (HOSPITAL_COMMUNITY): Payer: Self-pay | Admitting: Vascular Surgery

## 2023-01-30 NOTE — Patient Instructions (Signed)
SURGICAL WAITING ROOM VISITATION Patients having surgery or a procedure may have no more than 2 support people in the waiting area - these visitors may rotate in the visitor waiting room.   Due to an increase in RSV and influenza rates and associated hospitalizations, children ages 12 and under may not visit patients in [place]. If the patient needs to stay at the hospital during part of their recovery, the visitor guidelines for inpatient rooms apply.  PRE-OP VISITATION  Pre-op nurse will coordinate an appropriate time for 1 support person to accompany the patient in pre-op.  This support person may not rotate.  This visitor will be contacted when the time is appropriate for the visitor to come back in the pre-op area.  Please refer to the [facility] website for the visitor guidelines for Inpatients (after your surgery is over and you are in a regular room).  You are not required to quarantine at this time prior to your surgery. However, you must do this: Hand Hygiene often Do NOT share personal items Notify your provider if you are in close contact with someone who has COVID or you develop fever 100.4 or greater, new onset of sneezing, cough, sore throat, shortness of breath or body aches.  If you test positive for Covid or have been in contact with anyone that has tested positive in the last 10 days please notify you surgeon.    Your procedure is scheduled on:  Wednesday  February 03, 2023  Report to [facility] Main Entrance: [address] entrance where the [employer] is available.   Report to admitting at: 12:15 PM  +++++Call this number if you have any questions or problems the morning of surgery [phone]  Do not eat food after Midnight the night prior to your surgery/procedure.  After Midnight you may have the following liquids until  11:30 AM  DAY OF SURGERY  Clear Liquid Diet Water Black Coffee (sugar ok, NO MILK/CREAM OR CREAMERS)  Tea (sugar ok,  NO MILK/CREAM OR CREAMERS) regular and decaf                             Plain Jell-O  with no fruit (NO RED)                                           Fruit ices (not with fruit pulp, NO RED)                                     Popsicles (NO RED)                                                                  Juice: apple, WHITE grape, WHITE cranberry Sports drinks like Gatorade or Powerade (NO RED)                   The day of surgery:  Drink ONE (1) Pre-Surgery Clear Ensure at   11:30  AM the morning of surgery. Drink in one sitting. Do  not sip.  This drink was given to you during your hospital pre-op appointment visit. Nothing else to drink after completing the Pre-Surgery Clear Ensure  : No candy, chewing gum or throat lozenges.    FOLLOW ANY ADDITIONAL PRE OP INSTRUCTIONS YOU RECEIVED FROM YOUR SURGEON'S OFFICE!!!   Oral Hygiene is also important to reduce your risk of infection.        Remember - BRUSH YOUR TEETH THE MORNING OF SURGERY WITH YOUR REGULAR TOOTHPASTE  Do NOT smoke after Midnight the night before surgery.   XARELTO: Do not take for 72 hours prior to you surgery.  Last Dose will be taken: Saturday 01-30-2023  Take ONLY these medicines the morning of surgery with A SIP OF WATER: Pantoprazole (Protonix),  Gabapentin.  You may take Oxycodone, Tylenol, Alprazolam if needed. You may use your Albuterol inhaler if needed.     If You have been diagnosed with Sleep Apnea - Bring CPAP mask and tubing day of surgery. We will provide you with a CPAP machine on the day of your surgery.                   You may not have any metal on your body including hair pins, jewelry, and body piercing  Do not wear make-up, lotions, powders, perfumes  or deodorant  Do not wear nail polish including gel and S&S, artificial / acrylic nails, or any other type of covering on natural nails including finger and toenails. If you have artificial nails, gel coating, etc., that needs to be removed  by a nail salon, Please have this removed prior to surgery. Not doing so may mean that your surgery could be cancelled or delayed if the Surgeon or anesthesia staff feels like they are unable to monitor you safely.   Do not shave 48 hours prior to surgery to avoid nicks in your skin which may contribute to postoperative infections.     Contacts, Hearing Aids, dentures or bridgework may not be worn into surgery. DENTURES WILL BE REMOVED PRIOR TO SURGERY PLEASE DO NOT APPLY "Poly grip" OR ADHESIVES!!!  You may bring a small overnight bag with you on the day of surgery, only pack items that are not valuable. [facility] IS NOT RESPONSIBLE   FOR VALUABLES THAT ARE LOST OR STOLEN.   Do not bring your home medications to the hospital. The Pharmacy will dispense medications listed on your medication list to you during your admission in the Hospital.   Please read over the following fact sheets you were given: IF YOU HAVE QUESTIONS ABOUT YOUR PRE-OP INSTRUCTIONS, PLEASE CALL [phone]  ([place])   [facility] - Preparing for Surgery Before surgery, you can play an important role.  Because skin is not sterile, your skin needs to be as free of germs as possible.  You can reduce the number of germs on your skin by washing with CHG (chlorahexidine gluconate) soap before surgery.  CHG is an antiseptic cleaner which kills germs and bonds with the skin to continue killing germs even after washing. Please DO NOT use if you have an allergy to CHG or antibacterial soaps.  If your skin becomes reddened/irritated stop using the CHG and inform your nurse when you arrive at Short Stay. Do not shave (including legs and underarms) for at least 48 hours prior to the first CHG shower.  You may shave your face/neck.  Please follow these instructions carefully:  1.  Shower with CHG Soap the night before surgery  and the  morning of surgery.  2.  If you choose to wash your hair, wash your hair first as usual with your  normal  shampoo.  3.  After you shampoo, rinse your hair and body thoroughly to remove the shampoo.                             4.  Use CHG as you would any other liquid soap.  You can apply chg directly to the skin and wash.  Gently with a scrungie or clean washcloth.  5.  Apply the CHG Soap to your body ONLY FROM THE NECK DOWN.   Do not use on face/ open                           Wound or open sores. Avoid contact with eyes, ears mouth and genitals (private parts).                       Wash face,  Genitals (private parts) with your normal soap.             6.  Wash thoroughly, paying special attention to the area where your  surgery  will be performed.  7.  Thoroughly rinse your body with warm water from the neck down.  8.  DO NOT shower/wash with your normal soap after using and rinsing off the CHG Soap.            9.  Pat yourself dry with a clean towel.            10.  Wear clean pajamas.            11.  Place clean sheets on your bed the night of your first shower and do not  sleep with pets.  ON THE DAY OF SURGERY : Do not apply any lotions/deodorants the morning of surgery.  Please wear clean clothes to the hospital/surgery center.    FAILURE TO FOLLOW THESE INSTRUCTIONS MAY RESULT IN THE CANCELLATION OF YOUR SURGERY  PATIENT SIGNATURE_________________________________  NURSE SIGNATURE__________________________________  ________________________________________________________________________

## 2023-01-30 NOTE — Progress Notes (Signed)
COVID Vaccine received:  []$  No [x]$  Yes Date of any COVID positive Test in last 90 days:None  PCP - Artis Flock   [phone] [facility] Dx clinic Cardiologist - none Oncology- Derek Jack, MD ([place] 09-25-2022)  Vascular- Dr. Servando Snare  Chest x-ray - 12-16-2022 1v  epic EKG - 11-23-2022  epic  Stress Test -  ECHO -  Done in [place] for Heart murmur  Cardiac Cath -   PCR screen: []$  Ordered & Completed                      []$   No Order but Needs PROFEND                      [x]$   N/A for this surgery  Surgery Plan:  []$  Ambulatory                            [x]$  Outpatient in bed                            []$  Admit  Anesthesia:    []$  General  []$  Spinal                           [x]$   Choice []$   MAC   Pacemaker / ICD device [x]$  No []$  Yes        Device order form faxed [x]$  No    []$   Yes      Faxed to:  Spinal Cord Stimulator:[x]$  No []$  Yes      (Remind patient to bring remote DOS) Other Implants:   History of Sleep Apnea? []$  No [x]$  Yes   CPAP used?- [x]$  No []$  Yes    Does the patient monitor blood sugar? []$  No []$  Yes  [x]$  N/A   No DM  Blood Thinner / Instructions: Xarelto   per Dr. Lyla Glassing  Hold x3 days  Patient stopped it- last dose on Saturday 01-30-23 per her husband.  Aspirin Instructions:  ASA 81 mg  hold x 3 days per Dr. Lyla Glassing  Last on Saturday 01-30-23 per her husband  ERAS Protocol Ordered: []$  No  [x]$  Yes PRE-SURGERY [x]$  ENSURE  []$  G2  Patient is to be NPO after: 11;30 am  Activity level: Patient can not climb a flight of stairs without difficulty. Patient can not perform ADLs without assistance.   Anesthesia review: Recent IVC filter removal on 01-18-23- Hx mult. DVT/PE, HTN, Anxiety, CAD, COPD, Opioid dependence, anemia.  Patient denies shortness of breath, fever, cough and chest pain at PAT appointment.  Patient verbalized understanding and agreement to the Pre-Surgical Instructions that were given to them at this PAT appointment. Patient was also  educated of the need to review these PAT instructions again prior to his/her surgery.I reviewed the appropriate phone numbers to call if they have any and questions or concerns.

## 2023-02-01 ENCOUNTER — Ambulatory Visit: Payer: Self-pay | Admitting: Student

## 2023-02-02 ENCOUNTER — Encounter (HOSPITAL_COMMUNITY): Payer: Self-pay

## 2023-02-02 ENCOUNTER — Encounter (HOSPITAL_COMMUNITY)
Admission: RE | Admit: 2023-02-02 | Discharge: 2023-02-02 | Disposition: A | Discharge status: Home / Self Care | Payer: Medicare Other | Source: Ambulatory Visit | Attending: Orthopedic Surgery | Admitting: Orthopedic Surgery

## 2023-02-02 ENCOUNTER — Other Ambulatory Visit: Payer: Self-pay

## 2023-02-02 VITALS — BP 107/63 | HR 68 | Temp 98.8°F | Resp 14 | Ht 65.0 in | Wt 239.0 lb

## 2023-02-02 DIAGNOSIS — Z01812 Encounter for preprocedural laboratory examination: Secondary | ICD-10-CM | POA: Insufficient documentation

## 2023-02-02 DIAGNOSIS — I2510 Atherosclerotic heart disease of native coronary artery without angina pectoris: Secondary | ICD-10-CM

## 2023-02-02 HISTORY — DX: Unspecified osteoarthritis, unspecified site: M19.90

## 2023-02-02 HISTORY — DX: Anemia, unspecified: D64.9

## 2023-02-02 HISTORY — DX: Pneumonia, unspecified organism: J18.9

## 2023-02-02 LAB — COMPREHENSIVE METABOLIC PANEL
ALT: 27 U/L (ref 0–44)
AST: 39 U/L (ref 15–41)
Albumin: 3.3 g/dL — ABNORMAL LOW (ref 3.5–5.0)
Alkaline Phosphatase: 72 U/L (ref 38–126)
Anion gap: 10 (ref 5–15)
BUN: 13 mg/dL (ref 8–23)
CO2: 27 mmol/L (ref 22–32)
Calcium: 8.7 mg/dL — ABNORMAL LOW (ref 8.9–10.3)
Chloride: 104 mmol/L (ref 98–111)
Creatinine, Ser: 0.53 mg/dL (ref 0.44–1.00)
GFR, Estimated: >60 mL/min (ref >60)
Glucose, Bld: 111 mg/dL — ABNORMAL HIGH (ref 70–99)
Potassium: 3.8 mmol/L (ref 3.5–5.1)
Sodium: 141 mmol/L (ref 135–145)
Total Bilirubin: 0.2 mg/dL — ABNORMAL LOW (ref 0.3–1.2)
Total Protein: 7 g/dL (ref 6.5–8.1)

## 2023-02-02 LAB — CBC
HCT: 40.8 % (ref 36.0–46.0)
Hemoglobin: 12.6 g/dL (ref 12.0–15.0)
MCH: 31 pg (ref 26.0–34.0)
MCHC: 30.9 g/dL (ref 30.0–36.0)
MCV: 100.2 fL — ABNORMAL HIGH (ref 80.0–100.0)
Platelets: 193 10*3/uL (ref 150–400)
RBC: 4.07 MIL/uL (ref 3.87–5.11)
RDW: 13.2 % (ref 11.5–15.5)
WBC: 7.3 10*3/uL (ref 4.0–10.5)
nRBC: 0 % (ref 0.0–0.2)

## 2023-02-03 ENCOUNTER — Ambulatory Visit (HOSPITAL_COMMUNITY): Payer: Medicare Other | Admitting: Anesthesiology

## 2023-02-03 ENCOUNTER — Encounter (HOSPITAL_COMMUNITY)
Admission: AD | Disposition: A | Discharge status: Home / Self Care | Payer: Self-pay | Source: Home / Self Care | Attending: Orthopedic Surgery

## 2023-02-03 ENCOUNTER — Inpatient Hospital Stay (HOSPITAL_COMMUNITY)
Admission: AD | Admit: 2023-02-03 | Discharge: 2023-02-05 | DRG: 488 | Disposition: A | Discharge status: Home / Self Care | Payer: Medicare Other | Attending: Orthopedic Surgery | Admitting: Orthopedic Surgery

## 2023-02-03 ENCOUNTER — Encounter (HOSPITAL_COMMUNITY): Payer: Self-pay | Admitting: Orthopedic Surgery

## 2023-02-03 ENCOUNTER — Ambulatory Visit (HOSPITAL_COMMUNITY): Payer: Medicare Other

## 2023-02-03 DIAGNOSIS — J449 Chronic obstructive pulmonary disease, unspecified: Secondary | ICD-10-CM | POA: Diagnosis present

## 2023-02-03 DIAGNOSIS — T84093A Other mechanical complication of internal left knee prosthesis, initial encounter: Principal | ICD-10-CM

## 2023-02-03 DIAGNOSIS — T84023A Instability of internal left knee prosthesis, initial encounter: Secondary | ICD-10-CM

## 2023-02-03 DIAGNOSIS — Y792 Prosthetic and other implants, materials and accessory orthopedic devices associated with adverse incidents: Secondary | ICD-10-CM | POA: Diagnosis present

## 2023-02-03 DIAGNOSIS — Z87891 Personal history of nicotine dependence: Secondary | ICD-10-CM

## 2023-02-03 DIAGNOSIS — Z6839 Body mass index (BMI) 39.0-39.9, adult: Secondary | ICD-10-CM

## 2023-02-03 DIAGNOSIS — Z79899 Other long term (current) drug therapy: Secondary | ICD-10-CM

## 2023-02-03 DIAGNOSIS — Z888 Allergy status to other drugs, medicaments and biological substances status: Secondary | ICD-10-CM

## 2023-02-03 DIAGNOSIS — M797 Fibromyalgia: Secondary | ICD-10-CM | POA: Diagnosis present

## 2023-02-03 DIAGNOSIS — I2510 Atherosclerotic heart disease of native coronary artery without angina pectoris: Principal | ICD-10-CM

## 2023-02-03 DIAGNOSIS — F32A Depression, unspecified: Secondary | ICD-10-CM | POA: Diagnosis present

## 2023-02-03 DIAGNOSIS — K219 Gastro-esophageal reflux disease without esophagitis: Secondary | ICD-10-CM | POA: Diagnosis present

## 2023-02-03 DIAGNOSIS — D62 Acute posthemorrhagic anemia: Secondary | ICD-10-CM | POA: Diagnosis not present

## 2023-02-03 DIAGNOSIS — T8132XA Disruption of internal operation (surgical) wound, not elsewhere classified, initial encounter: Secondary | ICD-10-CM | POA: Diagnosis present

## 2023-02-03 DIAGNOSIS — F418 Other specified anxiety disorders: Secondary | ICD-10-CM

## 2023-02-03 DIAGNOSIS — F419 Anxiety disorder, unspecified: Secondary | ICD-10-CM | POA: Diagnosis present

## 2023-02-03 DIAGNOSIS — Z8249 Family history of ischemic heart disease and other diseases of the circulatory system: Secondary | ICD-10-CM

## 2023-02-03 DIAGNOSIS — Z7901 Long term (current) use of anticoagulants: Secondary | ICD-10-CM

## 2023-02-03 DIAGNOSIS — Z86711 Personal history of pulmonary embolism: Secondary | ICD-10-CM

## 2023-02-03 HISTORY — PX: KNEE ARTHROTOMY: SHX5881

## 2023-02-03 SURGERY — ARTHROTOMY, KNEE
Anesthesia: General | Site: Knee | Laterality: Left

## 2023-02-03 MED ORDER — FENTANYL CITRATE PF 50 MCG/ML IJ SOSY
50.0000 ug | PREFILLED_SYRINGE | INTRAMUSCULAR | Status: DC
  Administered 2023-02-03: 50 ug via INTRAVENOUS
  Filled 2023-02-03: qty 2

## 2023-02-03 MED ORDER — ONDANSETRON HCL 4 MG/2ML IJ SOLN: 4.0000 mg | Freq: Once | INTRAMUSCULAR | Status: DC | PRN

## 2023-02-03 MED ORDER — VANCOMYCIN HCL 1500 MG/300ML IV SOLN
1500.0000 mg | INTRAVENOUS | Status: AC
  Administered 2023-02-03: 1500 mg via INTRAVENOUS
  Filled 2023-02-03: qty 300

## 2023-02-03 MED ORDER — CEFAZOLIN SODIUM-DEXTROSE 2-4 GM/100ML-% IV SOLN
2.0000 g | INTRAVENOUS | Status: AC
  Administered 2023-02-03: 2 g via INTRAVENOUS
  Filled 2023-02-03: qty 100

## 2023-02-03 MED ORDER — TRANEXAMIC ACID-NACL 1000-0.7 MG/100ML-% IV SOLN
1000.0000 mg | INTRAVENOUS | Status: AC
  Administered 2023-02-03: 1000 mg via INTRAVENOUS
  Filled 2023-02-03: qty 100

## 2023-02-03 MED ORDER — MEPERIDINE HCL 50 MG/ML IJ SOLN: 6.2500 mg | INTRAMUSCULAR | Status: DC | PRN

## 2023-02-03 MED ORDER — LACTATED RINGERS IV SOLN: INTRAVENOUS | Status: DC

## 2023-02-03 MED ORDER — BUPIVACAINE HCL (PF) 0.5 % IJ SOLN
INTRAMUSCULAR | Status: DC | PRN
  Administered 2023-02-03 (×5): 2 mL via PERINEURAL

## 2023-02-03 MED ORDER — OXYCODONE HCL 5 MG PO TABS
ORAL_TABLET | ORAL | Status: AC
  Filled 2023-02-03: qty 2

## 2023-02-03 MED ORDER — KETOROLAC TROMETHAMINE 15 MG/ML IJ SOLN
15.0000 mg | Freq: Once | INTRAMUSCULAR | Status: AC
  Administered 2023-02-03: 15 mg via INTRAVENOUS

## 2023-02-03 MED ORDER — ONDANSETRON HCL 4 MG/2ML IJ SOLN
INTRAMUSCULAR | Status: DC | PRN
  Administered 2023-02-03: 4 mg via INTRAVENOUS

## 2023-02-03 MED ORDER — HYDRALAZINE HCL 20 MG/ML IJ SOLN: 5.0000 mg | Freq: Once | INTRAMUSCULAR | Status: DC

## 2023-02-03 MED ORDER — ISOPROPYL ALCOHOL 70 % SOLN
Status: AC
  Filled 2023-02-03: qty 480

## 2023-02-03 MED ORDER — OXYCODONE HCL 5 MG PO TABS: 20.0000 mg | ORAL_TABLET | ORAL | Status: DC | PRN

## 2023-02-03 MED ORDER — POVIDONE-IODINE 10 % EX SWAB
2.0000 | Freq: Once | CUTANEOUS | Status: AC
  Administered 2023-02-03: 2 via TOPICAL

## 2023-02-03 MED ORDER — DULOXETINE HCL 60 MG PO CPEP
60.0000 mg | ORAL_CAPSULE | Freq: Every day | ORAL | Status: DC
  Administered 2023-02-03 – 2023-02-04 (×2): 60 mg via ORAL
  Filled 2023-02-03 (×2): qty 1

## 2023-02-03 MED ORDER — ISOPROPYL ALCOHOL 70 % SOLN
Status: DC | PRN
  Administered 2023-02-03: 1 via TOPICAL

## 2023-02-03 MED ORDER — ALBUTEROL SULFATE HFA 108 (90 BASE) MCG/ACT IN AERS: 2.0000 | INHALATION_SPRAY | Freq: Four times a day (QID) | RESPIRATORY_TRACT | Status: DC | PRN

## 2023-02-03 MED ORDER — HYDROMORPHONE HCL 1 MG/ML IJ SOLN
INTRAMUSCULAR | Status: AC
  Filled 2023-02-03: qty 1

## 2023-02-03 MED ORDER — OXYCODONE HCL 5 MG PO TABS
10.0000 mg | ORAL_TABLET | ORAL | Status: DC | PRN
  Administered 2023-02-04 – 2023-02-05 (×5): 10 mg via ORAL
  Filled 2023-02-03 (×5): qty 2

## 2023-02-03 MED ORDER — ACETAMINOPHEN 500 MG PO TABS
1000.0000 mg | ORAL_TABLET | Freq: Once | ORAL | Status: AC
  Administered 2023-02-03: 1000 mg via ORAL
  Filled 2023-02-03: qty 2

## 2023-02-03 MED ORDER — DIPHENHYDRAMINE HCL 12.5 MG/5ML PO ELIX: 12.5000 mg | ORAL_SOLUTION | ORAL | Status: DC | PRN

## 2023-02-03 MED ORDER — ONDANSETRON HCL 4 MG/2ML IJ SOLN
INTRAMUSCULAR | Status: AC
  Filled 2023-02-03: qty 2

## 2023-02-03 MED ORDER — DOCUSATE SODIUM 100 MG PO CAPS
100.0000 mg | ORAL_CAPSULE | Freq: Two times a day (BID) | ORAL | Status: DC
  Administered 2023-02-03 – 2023-02-05 (×4): 100 mg via ORAL
  Filled 2023-02-03 (×4): qty 1

## 2023-02-03 MED ORDER — METOCLOPRAMIDE HCL 5 MG PO TABS: 5.0000 mg | ORAL_TABLET | Freq: Three times a day (TID) | ORAL | Status: DC | PRN

## 2023-02-03 MED ORDER — METHOCARBAMOL 500 MG IVPB - SIMPLE MED: 500.0000 mg | Freq: Four times a day (QID) | INTRAVENOUS | Status: DC | PRN

## 2023-02-03 MED ORDER — MELATONIN 5 MG PO TABS
10.0000 mg | ORAL_TABLET | Freq: Every day | ORAL | Status: DC
  Administered 2023-02-03 – 2023-02-04 (×2): 10 mg via ORAL
  Filled 2023-02-03 (×2): qty 2

## 2023-02-03 MED ORDER — ACETAMINOPHEN 10 MG/ML IV SOLN
1000.0000 mg | Freq: Once | INTRAVENOUS | Status: DC | PRN
  Administered 2023-02-03: 1000 mg via INTRAVENOUS

## 2023-02-03 MED ORDER — SODIUM CHLORIDE 0.9 % IR SOLN
Status: DC | PRN
  Administered 2023-02-03: 7000 mL

## 2023-02-03 MED ORDER — LIDOCAINE HCL (PF) 2 % IJ SOLN
INTRAMUSCULAR | Status: AC
  Filled 2023-02-03: qty 5

## 2023-02-03 MED ORDER — CHLORHEXIDINE GLUCONATE 0.12 % MT SOLN
15.0000 mL | Freq: Once | OROMUCOSAL | Status: AC
  Administered 2023-02-03: 15 mL via OROMUCOSAL

## 2023-02-03 MED ORDER — OXYCODONE HCL 5 MG PO TABS
10.0000 mg | ORAL_TABLET | Freq: Once | ORAL | Status: AC
  Administered 2023-02-03: 10 mg via ORAL

## 2023-02-03 MED ORDER — SODIUM CHLORIDE 0.9 % IV SOLN: INTRAVENOUS | Status: DC

## 2023-02-03 MED ORDER — FENTANYL CITRATE (PF) 100 MCG/2ML IJ SOLN
INTRAMUSCULAR | Status: AC
  Filled 2023-02-03: qty 2

## 2023-02-03 MED ORDER — SENNA 8.6 MG PO TABS
1.0000 | ORAL_TABLET | Freq: Two times a day (BID) | ORAL | Status: DC
  Administered 2023-02-03 – 2023-02-05 (×4): 8.6 mg via ORAL
  Filled 2023-02-03 (×4): qty 1

## 2023-02-03 MED ORDER — ACETAMINOPHEN 10 MG/ML IV SOLN
INTRAVENOUS | Status: AC
  Filled 2023-02-03: qty 100

## 2023-02-03 MED ORDER — PROPOFOL 10 MG/ML IV BOLUS
INTRAVENOUS | Status: AC
  Filled 2023-02-03: qty 20

## 2023-02-03 MED ORDER — ALBUTEROL SULFATE (2.5 MG/3ML) 0.083% IN NEBU: 2.5000 mg | INHALATION_SOLUTION | Freq: Four times a day (QID) | RESPIRATORY_TRACT | Status: DC | PRN

## 2023-02-03 MED ORDER — MIDAZOLAM HCL 2 MG/2ML IJ SOLN
1.0000 mg | INTRAMUSCULAR | Status: DC
  Administered 2023-02-03: 1 mg via INTRAVENOUS
  Filled 2023-02-03: qty 2

## 2023-02-03 MED ORDER — ALPRAZOLAM 0.5 MG PO TABS: 1.0000 mg | ORAL_TABLET | Freq: Four times a day (QID) | ORAL | Status: DC | PRN

## 2023-02-03 MED ORDER — PROPOFOL 10 MG/ML IV BOLUS
INTRAVENOUS | Status: DC | PRN
  Administered 2023-02-03: 160 mg via INTRAVENOUS

## 2023-02-03 MED ORDER — KETOROLAC TROMETHAMINE 15 MG/ML IJ SOLN
INTRAMUSCULAR | Status: AC
  Filled 2023-02-03: qty 1

## 2023-02-03 MED ORDER — CEFAZOLIN SODIUM-DEXTROSE 2-4 GM/100ML-% IV SOLN
2.0000 g | Freq: Four times a day (QID) | INTRAVENOUS | Status: AC
  Administered 2023-02-03 – 2023-02-04 (×2): 2 g via INTRAVENOUS
  Filled 2023-02-03 (×2): qty 100

## 2023-02-03 MED ORDER — ASPIRIN 81 MG PO CHEW
81.0000 mg | CHEWABLE_TABLET | Freq: Two times a day (BID) | ORAL | Status: DC
  Administered 2023-02-03 – 2023-02-04 (×2): 81 mg via ORAL
  Filled 2023-02-03 (×2): qty 1

## 2023-02-03 MED ORDER — BUPIVACAINE LIPOSOME 1.3 % IJ SUSP
INTRAMUSCULAR | Status: DC | PRN
  Administered 2023-02-03 (×5): 2 mL via PERINEURAL

## 2023-02-03 MED ORDER — BUPIVACAINE HCL (PF) 0.25 % IJ SOLN
INTRAMUSCULAR | Status: AC
  Filled 2023-02-03: qty 30

## 2023-02-03 MED ORDER — GABAPENTIN 400 MG PO CAPS
400.0000 mg | ORAL_CAPSULE | Freq: Three times a day (TID) | ORAL | Status: DC
  Administered 2023-02-03 – 2023-02-05 (×5): 400 mg via ORAL
  Filled 2023-02-03 (×5): qty 1

## 2023-02-03 MED ORDER — ACETAMINOPHEN 325 MG PO TABS
325.0000 mg | ORAL_TABLET | Freq: Four times a day (QID) | ORAL | Status: DC | PRN
  Administered 2023-02-04 – 2023-02-05 (×4): 650 mg via ORAL
  Filled 2023-02-03 (×4): qty 2

## 2023-02-03 MED ORDER — DEXAMETHASONE SODIUM PHOSPHATE 10 MG/ML IJ SOLN
INTRAMUSCULAR | Status: DC | PRN
  Administered 2023-02-03: 10 mg via INTRAVENOUS

## 2023-02-03 MED ORDER — POLYETHYLENE GLYCOL 3350 17 G PO PACK: 17.0000 g | PACK | Freq: Every day | ORAL | Status: DC | PRN

## 2023-02-03 MED ORDER — METHOCARBAMOL 500 MG PO TABS
500.0000 mg | ORAL_TABLET | Freq: Four times a day (QID) | ORAL | Status: DC | PRN
  Administered 2023-02-04 – 2023-02-05 (×3): 500 mg via ORAL
  Filled 2023-02-03 (×3): qty 1

## 2023-02-03 MED ORDER — ONDANSETRON HCL 4 MG PO TABS: 4.0000 mg | ORAL_TABLET | Freq: Four times a day (QID) | ORAL | Status: DC | PRN

## 2023-02-03 MED ORDER — HYDRALAZINE HCL 20 MG/ML IJ SOLN
5.0000 mg | INTRAMUSCULAR | Status: DC | PRN
  Administered 2023-02-03 (×2): 5 mg via INTRAVENOUS

## 2023-02-03 MED ORDER — METOCLOPRAMIDE HCL 5 MG/ML IJ SOLN: 5.0000 mg | Freq: Three times a day (TID) | INTRAMUSCULAR | Status: DC | PRN

## 2023-02-03 MED ORDER — LIDOCAINE 2% (20 MG/ML) 5 ML SYRINGE
INTRAMUSCULAR | Status: DC | PRN
  Administered 2023-02-03: 80 mg via INTRAVENOUS

## 2023-02-03 MED ORDER — ALUM & MAG HYDROXIDE-SIMETH 200-200-20 MG/5ML PO SUSP: 30.0000 mL | ORAL | Status: DC | PRN

## 2023-02-03 MED ORDER — ORAL CARE MOUTH RINSE: 15.0000 mL | Freq: Once | OROMUCOSAL | Status: AC

## 2023-02-03 MED ORDER — PANTOPRAZOLE SODIUM 40 MG PO TBEC
40.0000 mg | DELAYED_RELEASE_TABLET | Freq: Every day | ORAL | Status: DC
  Administered 2023-02-04 – 2023-02-05 (×2): 40 mg via ORAL
  Filled 2023-02-03 (×2): qty 1

## 2023-02-03 MED ORDER — PROMETHAZINE HCL 25 MG PO TABS: 25.0000 mg | ORAL_TABLET | Freq: Four times a day (QID) | ORAL | Status: DC | PRN

## 2023-02-03 MED ORDER — FENTANYL CITRATE (PF) 100 MCG/2ML IJ SOLN
INTRAMUSCULAR | Status: DC | PRN
  Administered 2023-02-03 (×4): 50 ug via INTRAVENOUS

## 2023-02-03 MED ORDER — FAMOTIDINE 20 MG PO TABS
20.0000 mg | ORAL_TABLET | Freq: Every day | ORAL | Status: DC
  Administered 2023-02-04: 20 mg via ORAL
  Filled 2023-02-03: qty 1

## 2023-02-03 MED ORDER — HYDRALAZINE HCL 20 MG/ML IJ SOLN
INTRAMUSCULAR | Status: AC
  Filled 2023-02-03: qty 1

## 2023-02-03 MED ORDER — POVIDONE-IODINE 10 % EX SWAB: 2.0000 | Freq: Once | CUTANEOUS | Status: DC

## 2023-02-03 MED ORDER — HYDROMORPHONE HCL 1 MG/ML IJ SOLN
0.5000 mg | INTRAMUSCULAR | Status: DC | PRN
  Administered 2023-02-03: 1 mg via INTRAVENOUS
  Administered 2023-02-05: 0.5 mg via INTRAVENOUS
  Filled 2023-02-03 (×3): qty 1

## 2023-02-03 MED ORDER — ONDANSETRON HCL 4 MG/2ML IJ SOLN: 4.0000 mg | Freq: Four times a day (QID) | INTRAMUSCULAR | Status: DC | PRN

## 2023-02-03 MED ORDER — PHENOL 1.4 % MT LIQD: 1.0000 | OROMUCOSAL | Status: DC | PRN

## 2023-02-03 MED ORDER — HYDROMORPHONE HCL 1 MG/ML IJ SOLN
0.2500 mg | INTRAMUSCULAR | Status: DC | PRN
  Administered 2023-02-03 (×3): 0.5 mg via INTRAVENOUS

## 2023-02-03 MED ORDER — MENTHOL 3 MG MT LOZG: 1.0000 | LOZENGE | OROMUCOSAL | Status: DC | PRN

## 2023-02-03 SURGICAL SUPPLY — 49 items
ADH SKN CLS APL DERMABOND .7 (GAUZE/BANDAGES/DRESSINGS) ×1
APL PRP STRL LF DISP 70% ISPRP (MISCELLANEOUS) ×2
ATTUNE PSRP INSR SZ6 5 KNEE (Insert) IMPLANT
BAG COUNTER SPONGE SURGICOUNT (BAG) IMPLANT
BAG SPEC THK2 15X12 ZIP CLS (MISCELLANEOUS)
BAG SPNG CNTER NS LX DISP (BAG)
BAG ZIPLOCK 12X15 (MISCELLANEOUS) ×2 IMPLANT
BNDG CMPR 5X62 HK CLSR LF (GAUZE/BANDAGES/DRESSINGS) ×1
BNDG ELASTIC 6INX 5YD STR LF (GAUZE/BANDAGES/DRESSINGS) ×2 IMPLANT
CANISTER WOUNDNEG PRESSURE 500 (CANNISTER) IMPLANT
CHLORAPREP W/TINT 26 (MISCELLANEOUS) ×2 IMPLANT
COVER SURGICAL LIGHT HANDLE (MISCELLANEOUS) ×2 IMPLANT
CUFF TOURN SGL QUICK 34 (TOURNIQUET CUFF)
CUFF TRNQT CYL 34X4.125X (TOURNIQUET CUFF) IMPLANT
DERMABOND ADVANCED .7 DNX12 (GAUZE/BANDAGES/DRESSINGS) ×2 IMPLANT
DRAPE U-SHAPE 47X51 STRL (DRAPES) ×2 IMPLANT
DRSG ADAPTIC 3X8 NADH LF (GAUZE/BANDAGES/DRESSINGS) ×2 IMPLANT
ELECT REM PT RETURN 15FT ADLT (MISCELLANEOUS) ×2 IMPLANT
EVACUATOR 1/8 PVC DRAIN (DRAIN) IMPLANT
GAUZE PAD ABD 8X10 STRL (GAUZE/BANDAGES/DRESSINGS) ×4 IMPLANT
GAUZE SPONGE 4X4 12PLY STRL (GAUZE/BANDAGES/DRESSINGS) ×2 IMPLANT
GLOVE BIO SURGEON STRL SZ7.5 (GLOVE) ×4 IMPLANT
GLOVE BIO SURGEON STRL SZ8.5 (GLOVE) ×4 IMPLANT
GLOVE BIOGEL PI IND STRL 7.5 (GLOVE) ×6 IMPLANT
GLOVE BIOGEL PI IND STRL 8.5 (GLOVE) ×2 IMPLANT
GOWN L4 XXLG W/PAP TWL (GOWN DISPOSABLE) ×2 IMPLANT
GOWN SPEC L4 XLG W/TWL (GOWN DISPOSABLE) ×2 IMPLANT
HANDPIECE INTERPULSE COAX TIP (DISPOSABLE) ×1
IMPL TAPESTRY BIOINTEGR 50X40 (Mesh General) IMPLANT
IMPL TAPESTRY BIOINTEGR 70X50 (Orthopedic Implant) IMPLANT
KIT DRSG PREVENA PLUS 7DAY 125 (MISCELLANEOUS) IMPLANT
KIT TURNOVER KIT A (KITS) IMPLANT
MANIFOLD NEPTUNE II (INSTRUMENTS) ×2 IMPLANT
PACK TOTAL KNEE CUSTOM (KITS) ×2 IMPLANT
PROTECTOR NERVE ULNAR (MISCELLANEOUS) ×2 IMPLANT
SEALER BIPOLAR AQUA 6.0 (INSTRUMENTS) IMPLANT
SET HNDPC FAN SPRY TIP SCT (DISPOSABLE) IMPLANT
SOLUTION PRONTOSAN WOUND 350ML (IRRIGATION / IRRIGATOR) IMPLANT
STRIP CLOSURE SKIN 1/2X4 (GAUZE/BANDAGES/DRESSINGS) ×4 IMPLANT
SUT ETHILON 3 0 PS 1 (SUTURE) IMPLANT
SUT MNCRL AB 4-0 PS2 18 (SUTURE) ×2 IMPLANT
SUT MON AB 2-0 CT1 27 (SUTURE) ×2 IMPLANT
SUT MON AB 3-0 SH 27 (SUTURE) ×1
SUT MON AB 3-0 SH27 (SUTURE) ×2 IMPLANT
SUT STRATAFIX PDO 1 14 VIOLET (SUTURE) ×1
SUT STRATFX PDO 1 14 VIOLET (SUTURE) ×1
SUT VIC AB 2-0 CT1 27 (SUTURE) ×2
SUT VIC AB 2-0 CT1 TAPERPNT 27 (SUTURE) ×4 IMPLANT
SUTURE STRATFX PDO 1 14 VIOLET (SUTURE) ×2 IMPLANT

## 2023-02-03 NOTE — Progress Notes (Signed)
Notified [facility] that patients BP returned to baseline after 2 doses of hydralazine.  But HR remains elevated at 108.  MDA OK with sending patient to floor.  Will attempt report.

## 2023-02-03 NOTE — Op Note (Signed)
OPERATIVE REPORT  SURGEON: Rod Can, MD   ASSISTANT: Larene Pickett, PA-C  PREOPERATIVE DIAGNOSIS: Repeat failure of arthrotomy repair, left knee.   POSTOPERATIVE DIAGNOSIS: Repeat failure of arthrotomy repair, left knee.   PROCEDURE: 1. Revision repair of medial parapatellar arthrotomy with augment. 2. Poly liner exchange. 3. Outside to inside lateral release.  EXPLANTS: DePuy Attune Aox RP poly size 5 mm.  IMPLANTS: DePuy Attune Aox RP poly size 5 mm. Tapestry BioIntegrative implant, size 50 mm x 40 mm x 25 mm, x2. Tapestry BioIntegrative implant, size 70 mm x 50 mm, x1.  ANESTHESIA:  GA combined with regional for post-op pain  TOURNIQUET TIME: Not utilized.   ESTIMATED BLOOD LOSS:-200 mL    ANTIBIOTICS: 2g Ancef. 1g vancomycin.  DRAINS: 1. Medium HV in knee joint x1. 2. 10 mm flat perforated drain in subcutaneous tissue x1. 3. Prevena custom negative pressure dressing at 75 mmHg.  COMPLICATIONS: None   CONDITION: PACU - hemodynamically stable.   BRIEF CLINICAL NOTE: Norma Anthony is a 67 y.o. female who underwent primary left total knee arthroplasty by Dr. Tonita Cong on 11/04/2022 with DePuy attune RP knee system.  The patient subsequently fell, resulting in traumatic dehiscence of her incision on 12/11/2022.  Dr. Stann Mainland was the physician on-call and took her to the operating room.  At the time of surgery, he found that she had a subacute failure of her medial arthrotomy repair.  He did a revision repair of the arthrotomy and closed her skin.  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  Dr. Tonita Cong aspirated her knee and sent the synovial fluid for Synovasure analysis, which was completely negative for infection.  I saw her in the office and she was found to have gross patellar instability with palpable defect over the medial peripatellar retinacular tissues.  She was indicated for  revision arthrotomy repair.  The risks, benefits, and alternatives to the procedure were explained, and the patient elected to proceed.  PROCEDURE IN DETAIL: Adductor canal block was obtained in the pre-op holding area. Once inside the operative room, general anesthesia was obtained, and a foley catheter was inserted. The patient was then positioned and the lower extremity was prepped and draped in the normal sterile surgical fashion.  A time-out was called verifying side and site of surgery. The patient received IV antibiotics within 60 minutes of beginning the procedure. A tourniquet was not utilized.   I examined her left knee under anesthesia.  She had full extension and flexion to approximately 115 degrees.  There was no ligamentous instability in full extension or 30 degrees of flexion.  She had gross instability of the patella.  In the center of her incision, she had a stable, dry eschar.  Using a #10 blade, I sharply excised her entire previous knee incision.  I ellipsed out the eschar.  Full-thickness skin flaps were created.  The arthrotomy was open from the superior pole of the patella to an area just distal to the inferior pole of the patella.  The retinacular tissue was flipped into the medial gutter and was scarred down.  She had serosanguineous joint fluid without any evidence of infection.  Cultures were not sent, as her knee was aspirated preoperatively.  I extended her medial parapatellar arthrotomy distally to the level of the tibial tubercle.  I also extended her arthrotomy up into the quadricep tendon.  A medial release was performed.  I brought the  knee into extension.  Using Bovie electrocautery, I sharply excised the infrapatellar scar.  I performed a radical synovectomy of the medial and lateral gutters.  I freed the extensor mechanism from the anterior surface of the femur.  I inspected the patellar component which was stable.  I excisionally debrided all scar tissue from around the  patella.  I then carefully dissected the medial retinacular tissue from the overlying subcutaneous tissue in order to appropriately mobilize the tissue.  I then placed a towel clip over the arthrotomy at the superior pole of the patella.  I brought the knee through a range of motion.  There was mild lateral tilting of the patella.  Therefore, I performed an outside to inside lateral release.  I brought the knee through range of motion again, and there was noted to be excellent tracking of the patella.  The knee was brought into flexion, and I used an osteotome to amputate the peg of the poly liner.  I excisionally debrided the posterior capsule.  The knee was then copiously irrigated with Prontosan solution and normal saline using pulse lavage.  I placed a new 5 mm RP PS liner.  I placed a medium Hemovac drain into the lateral gutter.  The arthrotomy was then closed with #1 Vicryl suture.  The arthrotomy was also repaired with #1 strata fix.  At this time, I placed a total of 2 trapezoidal Tapestry BioIntegrative collagen matrix implants, 1 over the arthrotomy at the superior pole of the patella, and 1 over the inferior pole of the patella.  I then opened an additional 70 mm by 50 mm Tapestry BioIntegrative collagen matrix implant which was cut in half and placed in between the 2 trapezoidal implants.  All of the tapestry implants were sewn in with 2-0 Monocryl suture.    Using a #10 blade, a stab incision was made over the lateral aspect of the thigh, and I placed a 10 mm perforated JP drain into the subcutaneous tissue.  The drain was sewn in with a 3-0 nylon suture.  The deep dermal layer was repaired with 2-0 Monocryl.  The skin was reapproximated with staples.  A customizable Prevena dressing was applied according to the manufacturer's instructions, and suction was applied at 75 mmHg.  There was excellent seal without any leak.  A compressive dressing were applied.  A knee immobilizer was applied.  The  patient was then awakened from anesthesia, and transported to the recovery room in stable condition.  Sponge, needle, and instrument counts were correct at the end of the case x2.  The patient tolerated the procedure well and there were no known complications.  Please note that a surgical assistant was a medical necessity for this procedure in order to perform it in a safe and expeditious manner. Surgical assistant was necessary to retract the ligaments and vital neurovascular structures to prevent injury to them and also necessary for proper positioning of the limb to allow for anatomic placement of the implants.

## 2023-02-03 NOTE — Transfer of Care (Signed)
Immediate Anesthesia Transfer of Care Note  Patient: Norma Anthony  Procedure(s) Performed: KNEE ARTHROTOMY WITH SOFT TISSUE REPAIR (Left: Knee)  Patient Location: PACU  Anesthesia Type:General  Level of Consciousness: awake, alert , and oriented  Airway & Oxygen Therapy: Patient Spontanous Breathing and Patient connected to face mask oxygen  Post-op Assessment: Report given to RN and Post -op Vital signs reviewed and stable  Post vital signs: Reviewed and stable  Last Vitals:  Vitals Value Taken Time  BP 157/120 02/03/23 1805  Temp    Pulse 106 02/03/23 1808  Resp 19 02/03/23 1808  SpO2 94 % 02/03/23 1808  Vitals shown include unvalidated device data.  Last Pain:  Vitals:   02/03/23 1446  TempSrc:   PainSc: 0-No pain         Complications: No notable events documented.

## 2023-02-03 NOTE — Anesthesia Postprocedure Evaluation (Signed)
Anesthesia Post Note  Patient: Norma Anthony  Procedure(s) Performed: KNEE ARTHROTOMY WITH SOFT TISSUE REPAIR (Left: Knee)     Patient location during evaluation: PACU Anesthesia Type: General Level of consciousness: sedated Pain management: pain level controlled Vital Signs Assessment: post-procedure vital signs reviewed and stable Respiratory status: spontaneous breathing and respiratory function stable Cardiovascular status: stable Postop Assessment: no apparent nausea or vomiting Anesthetic complications: no  No notable events documented.  Last Vitals:  Vitals:   02/03/23 1845 02/03/23 1850  BP: (!) 170/94 (!) 170/94  Pulse: (!) 102 100  Resp: 13 13  Temp:    SpO2: 93%     Last Pain:  Vitals:   02/03/23 1845  TempSrc:   PainSc: Asleep                 Britini Garcilazo DANIEL

## 2023-02-03 NOTE — H&P (Signed)
PREOPERATIVE H&P  Chief Complaint: Left knee fascial dehiscence  HPI: Norma Anthony is a 67 y.o. female who presents for preoperative history and physical with a diagnosis of Left knee fascial dehiscence. Symptoms are rated as moderate to severe, and have been worsening.  This is significantly impairing activities of daily living.  She has elected for surgical management.   Past Medical History:  Diagnosis Date   Anemia    Anxiety    Arthritis    CAD (coronary artery disease)    COPD (chronic obstructive pulmonary disease) (HCC)    Depression    Dyspnea    Fibromyalgia    GERD (gastroesophageal reflux disease)    Hiatal hernia    History of kidney stones    Migraine headache    Multinodular goiter    Nephrolithiasis    Pneumonia    Pulmonary embolism ([place]) 1996   Sleep apnea    Thyroid nodule    Past Surgical History:  Procedure Laterality Date   ABDOMINAL HYSTERECTOMY     BIOPSY THYROID     HERNIA REPAIR     I & D EXTREMITY Left 12/11/2022   Procedure: IRRIGATION AND DEBRIDEMENT EXTREMITY;  Surgeon: Nicholes Stairs, MD;  Location: [facility];  Service: Orthopedics;  Laterality: Left;   IVC FILTER INSERTION N/A 09/28/2022   Procedure: IVC FILTER INSERTION;  Surgeon: Waynetta Sandy, MD;  Location: [facility];  Service: Cardiovascular;  Laterality: N/A;   IVC FILTER REMOVAL N/A 01/18/2023   Procedure: IVC FILTER REMOVAL;  Surgeon: Waynetta Sandy, MD;  Location: [facility];  Service: Cardiovascular;  Laterality: N/A;   KIDNEY STONE SURGERY     LAPAROSCOPIC NISSEN FUNDOPLICATION     PAROTIDECTOMY     TONSILLECTOMY     TOTAL KNEE ARTHROPLASTY Left 11/04/2022   Procedure: TOTAL KNEE ARTHROPLASTY;  Surgeon: Susa Day, MD;  Location: [facility];  Service: Orthopedics;  Laterality: Left;   Social History   Socioeconomic History   Marital status: Married    Spouse name: Not on file   Number of children: Not on file   Years of education:  Not on file   Highest education level: Not on file  Occupational History   Not on file  Tobacco Use   Smoking status: Former    Packs/day: 0.50    Years: 10.00    Total pack years: 5.00    Types: Cigarettes    Quit date: 02/12/2021    Years since quitting: 1.9   Smokeless tobacco: Never  Vaping Use   Vaping Use: Never used  Substance and Sexual Activity   Alcohol use: Not Currently   Drug use: Not Currently   Sexual activity: Not Currently  Other Topics Concern   Not on file  Social History Narrative   Not on file   Social Determinants of Health   Financial Resource Strain: Not on file  Food Insecurity: No Food Insecurity (12/11/2022)   Hunger Vital Sign    Worried About Running Out of Food in the Last Year: Never true    Ran Out of Food in the Last Year: Never true  Transportation Needs: No Transportation Needs (12/11/2022)   PRAPARE - Hydrologist (Medical): No    Lack of Transportation (Non-Medical): No  Physical Activity: Not on file  Stress: Not on file  Social Connections: Not on file   Family History  Problem Relation Age of Onset   Hypertension Mother  Allergies  Allergen Reactions   Lyrica [Pregabalin] Other (See Comments)    Paralysis present (finding)- not tolerated Pt stated she could not move her body but she could think      Belbuca [Buprenorphine Hcl] Other (See Comments)    " Horrible headache"    Compazine [Prochlorperazine] Anxiety   Droperidol Anxiety   Ergotamine-Caffeine Anxiety and Hypertension    Increased systolic arterial pressure (finding)    Prior to Admission medications   Medication Sig Start Date End Date Taking? Authorizing Provider  acetaminophen (TYLENOL) 500 MG tablet Take 1,000 mg by mouth every 6 (six) hours as needed for moderate pain.   Yes [provider]  albuterol (VENTOLIN HFA) 108 (90 Base) MCG/ACT inhaler Inhale 2 puffs into the lungs every 6 (six) hours as needed for wheezing  or shortness of breath. 05/12/21  Yes [provider]  ALPRAZolam Duanne Moron) 1 MG tablet Take 1 mg by mouth every 6 (six) hours as needed for anxiety. 12/29/22  Yes [provider]  aspirin EC 81 MG tablet Take 81 mg by mouth daily. Swallow whole.   Yes [provider]  Aspirin-Acetaminophen-Caffeine (GOODY HEADACHE PO) Take 1 packet by mouth daily as needed (pain).   Yes [provider]  b complex vitamins capsule Take 1 capsule by mouth daily.   Yes [provider]  clindamycin-benzoyl peroxide (BENZACLIN) gel Apply 1 Application topically 2 (two) times daily as needed (for acne breakouts).   Yes [provider]  clotrimazole-betamethasone (LOTRISONE) cream Apply 1 Application topically 2 (two) times daily as needed (for irritation under the breasts or abdominal folds).   Yes [provider]  diphenhydrAMINE (SOMINEX) 25 MG tablet Take 50-75 mg by mouth daily as needed for allergies.   Yes [provider]  DULoxetine (CYMBALTA) 60 MG capsule Take 60 mg by mouth at bedtime. 09/14/18  Yes [provider]  famotidine (PEPCID) 20 MG tablet One after supper 04/30/22  Yes Tanda Rockers, MD  Ferrous Fumarate-Folic Acid 99991111 MG TABS Take 1 tablet by mouth daily. Hematinic 02/29/20  Yes [provider]  gabapentin (NEURONTIN) 400 MG capsule Take 400 mg by mouth 3 (three) times daily. 08/21/22  Yes [provider]  melatonin 5 MG TABS Take 10 mg by mouth at bedtime.   Yes [provider]  Multiple Vitamins-Minerals (HAIR SKIN AND NAILS FORMULA) TABS Take 1 tablet by mouth daily.   Yes [provider]  naloxone (NARCAN) nasal spray 4 mg/0.1 mL Place 1 spray into the nose once as needed (accidental over dose). 10/16/21  Yes [provider]  oxyCODONE-acetaminophen (PERCOCET) 10-325 MG tablet Take 1 tablet by mouth 2 (two) times daily as needed for pain.   Yes [provider]   pantoprazole (PROTONIX) 40 MG tablet Take 40 mg by mouth daily with breakfast. 06/30/16  Yes [provider]  promethazine (PHENERGAN) 25 MG tablet Take 25 mg by mouth every 6 (six) hours as needed for nausea or vomiting.   Yes [provider]  salicylic acid-lactic acid (CORN & CALLUS REMOVER) 17 % external solution Apply 1 Application topically daily as needed (corns).   Yes [provider]  tiZANidine (ZANAFLEX) 2 MG tablet Take 2 mg by mouth every 12 (twelve) hours as needed for muscle spasms. 01/09/23  Yes [provider]  XARELTO 20 MG TABS tablet Take 20 mg by mouth at bedtime. 08/21/21  Yes [provider]     LABS 01/22/2023:  CRP 10.2  mg/L ESR 43 mm/hr  01/22/2023 Synovial fluid: corrected WBC normal with 75% neutrophils, alpha defensin negative, culture negative.    Positive ROS: All other systems have been reviewed and were otherwise negative with the exception of those mentioned in the HPI and as above.  Physical Exam: General: Alert, no acute distress Cardiovascular: No pedal edema Respiratory: No cyanosis, no use of accessory musculature GI: No organomegaly, abdomen is soft and non-tender Skin: No lesions in the area of chief complaint Neurologic: Sensation intact distally Psychiatric: Patient is competent for consent with normal mood and affect Lymphatic: No axillary or cervical lymphadenopathy  MUSCULOSKELETAL: Examination of the left knee reveals a benign incision. In the center of the incision, she has a stable dry eschar. There is a palpable defect of the medial patellar retinaculum with patellar instability. She can perform a straight leg raise.   Assessment: Left knee fascial dehiscence  Plan: Plan for Procedure(s): KNEE ARTHROTOMY WITH SOFT TISSUE REPAIR  The risks benefits and alternatives were discussed with the patient including but not limited to the risks of nonoperative treatment, versus surgical intervention  including infection, bleeding, nerve injury,  blood clots, cardiopulmonary complications, morbidity, mortality, among others, and they were willing to proceed.   Bertram Savin, MD [phone]   02/03/2023 8:29 AM

## 2023-02-03 NOTE — Anesthesia Preprocedure Evaluation (Signed)
Anesthesia Evaluation  Patient identified by MRN, date of birth, ID band Patient awake    Reviewed: Allergy & Precautions, NPO status , Patient's Chart, lab work & pertinent test results  Airway Mallampati: II  TM Distance: >3 FB Neck ROM: Full    Dental  (+) Edentulous Upper, Edentulous Lower, Dental Advisory Given   Pulmonary shortness of breath, sleep apnea , COPD,  COPD inhaler, former smoker, PE   Pulmonary exam normal        Cardiovascular + CAD and + DVT (s/p IVC 09/2022)  Normal cardiovascular exam  EKG 07/05/22: Sinus rhythm. Abnormal R-wave progression, early transition. LVH. Anterior Q waves, possibly due to LVH     CV: Echo 02/14/21 (care everywhere):  1. Left ventricle is normal in size and function with EF 60-65%.  Normal LV wall thickness.  Normal diastolic function.  2.  Right ventricle is normal in size and function.  3.  No valvular abnormalities.  4.  Normal right ventricular systolic pressure.  No evidence of pulmonary hypertension.  5. No pericardial effusion.     Neuro/Psych  Headaches PSYCHIATRIC DISORDERS Anxiety Depression       GI/Hepatic Neg liver ROS, hiatal hernia,GERD  Medicated and Poorly Controlled,,  Endo/Other    Morbid obesity  Renal/GU negative Renal ROS  negative genitourinary   Musculoskeletal  (+)  Fibromyalgia -, narcotic dependent  Abdominal  (+) + obese  Peds  Hematology  (+) Blood dyscrasia (on xarelto, last dose 11/19 at 9pm)   Anesthesia Other Findings   Reproductive/Obstetrics                             Anesthesia Physical Anesthesia Plan  ASA: 3  Anesthesia Plan: General   Post-op Pain Management:    Induction: Intravenous  PONV Risk Score and Plan: 3 and Ondansetron, Dexamethasone and Midazolam  Airway Management Planned: LMA  Additional Equipment:   Intra-op Plan:   Post-operative Plan: Extubation in OR  Informed Consent: I  have reviewed the patients History and Physical, chart, labs and discussed the procedure including the risks, benefits and alternatives for the proposed anesthesia with the patient or authorized representative who has indicated his/her understanding and acceptance.     Dental advisory given  Plan Discussed with:   Anesthesia Plan Comments:        Anesthesia Quick Evaluation

## 2023-02-03 NOTE — Anesthesia Procedure Notes (Signed)
Procedure Name: LMA Insertion Date/Time: 02/03/2023 3:34 PM  Performed by: Gean Maidens, CRNAPre-anesthesia Checklist: Patient identified, Emergency Drugs available, Suction available, Patient being monitored and Timeout performed Patient Re-evaluated:Patient Re-evaluated prior to induction Oxygen Delivery Method: Circle system utilized Preoxygenation: Pre-oxygenation with 100% oxygen Induction Type: IV induction Ventilation: Mask ventilation without difficulty LMA: LMA inserted LMA Size: 4.0 Number of attempts: 1 Placement Confirmation: positive ETCO2 and breath sounds checked- equal and bilateral Tube secured with: Tape Dental Injury: Teeth and Oropharynx as per pre-operative assessment

## 2023-02-03 NOTE — Interval H&P Note (Signed)
History and Physical Interval Note:  02/03/2023 12:51 PM  Norma Anthony  has presented today for surgery, with the diagnosis of Left knee fascial dehiscence.  The various methods of treatment have been discussed with the patient and family. After consideration of risks, benefits and other options for treatment, the patient has consented to  Procedure(s) with comments: KNEE ARTHROTOMY WITH SOFT TISSUE REPAIR (Left) - 75 as a surgical intervention.  The patient's history has been reviewed, patient examined, no change in status, stable for surgery.  I have reviewed the patient's chart and labs.  Questions were answered to the patient's satisfaction.     Hilton Cork Usama Harkless

## 2023-02-03 NOTE — Anesthesia Procedure Notes (Signed)
Anesthesia Regional Block: Adductor canal block   Pre-Anesthetic Checklist: , timeout performed,  Correct Patient, Correct Site, Correct Laterality,  Correct Procedure, Correct Position, site marked,  Risks and benefits discussed,  Surgical consent,  Pre-op evaluation,  At surgeon's request and post-op pain management  Laterality: Lower and Left  Prep: chloraprep       Needles:  Injection technique: Single-shot  Needle Type: Echogenic Needle     Needle Length: 9cm  Needle Gauge: 20   Needle insertion depth: 4 cm   Additional Needles:   Procedures:,,,, ultrasound used (permanent image in chart),,    Narrative:  Start time: 02/03/2023 2:10 PM End time: 02/03/2023 2:20 PM Injection made incrementally with aspirations every 5 mL.  Performed by: Personally  Anesthesiologist: Lyn Hollingshead, MD

## 2023-02-04 ENCOUNTER — Encounter (HOSPITAL_COMMUNITY): Payer: Self-pay | Admitting: Orthopedic Surgery

## 2023-02-04 DIAGNOSIS — F32A Depression, unspecified: Secondary | ICD-10-CM | POA: Diagnosis present

## 2023-02-04 DIAGNOSIS — Z79899 Other long term (current) drug therapy: Secondary | ICD-10-CM | POA: Diagnosis not present

## 2023-02-04 DIAGNOSIS — D62 Acute posthemorrhagic anemia: Secondary | ICD-10-CM | POA: Diagnosis not present

## 2023-02-04 DIAGNOSIS — J449 Chronic obstructive pulmonary disease, unspecified: Secondary | ICD-10-CM | POA: Diagnosis present

## 2023-02-04 DIAGNOSIS — Z86711 Personal history of pulmonary embolism: Secondary | ICD-10-CM | POA: Diagnosis not present

## 2023-02-04 DIAGNOSIS — I2510 Atherosclerotic heart disease of native coronary artery without angina pectoris: Secondary | ICD-10-CM | POA: Diagnosis present

## 2023-02-04 DIAGNOSIS — K219 Gastro-esophageal reflux disease without esophagitis: Secondary | ICD-10-CM | POA: Diagnosis present

## 2023-02-04 DIAGNOSIS — Z8249 Family history of ischemic heart disease and other diseases of the circulatory system: Secondary | ICD-10-CM | POA: Diagnosis not present

## 2023-02-04 DIAGNOSIS — M797 Fibromyalgia: Secondary | ICD-10-CM | POA: Diagnosis present

## 2023-02-04 DIAGNOSIS — T84093A Other mechanical complication of internal left knee prosthesis, initial encounter: Secondary | ICD-10-CM | POA: Diagnosis present

## 2023-02-04 DIAGNOSIS — Z87891 Personal history of nicotine dependence: Secondary | ICD-10-CM | POA: Diagnosis not present

## 2023-02-04 DIAGNOSIS — Z888 Allergy status to other drugs, medicaments and biological substances status: Secondary | ICD-10-CM | POA: Diagnosis not present

## 2023-02-04 DIAGNOSIS — Z7901 Long term (current) use of anticoagulants: Secondary | ICD-10-CM | POA: Diagnosis not present

## 2023-02-04 DIAGNOSIS — T8132XA Disruption of internal operation (surgical) wound, not elsewhere classified, initial encounter: Secondary | ICD-10-CM | POA: Diagnosis present

## 2023-02-04 DIAGNOSIS — F419 Anxiety disorder, unspecified: Secondary | ICD-10-CM | POA: Diagnosis present

## 2023-02-04 DIAGNOSIS — Y792 Prosthetic and other implants, materials and accessory orthopedic devices associated with adverse incidents: Secondary | ICD-10-CM | POA: Diagnosis present

## 2023-02-04 LAB — CBC
HCT: 34.9 % — ABNORMAL LOW (ref 36.0–46.0)
Hemoglobin: 10.7 g/dL — ABNORMAL LOW (ref 12.0–15.0)
MCH: 31 pg (ref 26.0–34.0)
MCHC: 30.7 g/dL (ref 30.0–36.0)
MCV: 101.2 fL — ABNORMAL HIGH (ref 80.0–100.0)
Platelets: 186 10*3/uL (ref 150–400)
RBC: 3.45 MIL/uL — ABNORMAL LOW (ref 3.87–5.11)
RDW: 13.5 % (ref 11.5–15.5)
WBC: 10.4 10*3/uL (ref 4.0–10.5)
nRBC: 0 % (ref 0.0–0.2)

## 2023-02-04 LAB — BASIC METABOLIC PANEL
Anion gap: 9 (ref 5–15)
BUN: 19 mg/dL (ref 8–23)
CO2: 26 mmol/L (ref 22–32)
Calcium: 8.5 mg/dL — ABNORMAL LOW (ref 8.9–10.3)
Chloride: 103 mmol/L (ref 98–111)
Creatinine, Ser: 0.96 mg/dL (ref 0.44–1.00)
GFR, Estimated: >60 mL/min (ref >60)
Glucose, Bld: 169 mg/dL — ABNORMAL HIGH (ref 70–99)
Potassium: 4.7 mmol/L (ref 3.5–5.1)
Sodium: 138 mmol/L (ref 135–145)

## 2023-02-04 MED ORDER — RIVAROXABAN 10 MG PO TABS
10.0000 mg | ORAL_TABLET | Freq: Every day | ORAL | Status: DC
  Administered 2023-02-04 – 2023-02-05 (×2): 10 mg via ORAL
  Filled 2023-02-04 (×2): qty 1

## 2023-02-04 NOTE — Progress Notes (Signed)
Physical Therapy Treatment Patient Details Name: Norma Anthony MRN: [id number] DOB: 06/20/1956 Today's Date: 02/04/2023   History of Present Illness Norma Anthony is a 67 y.o. female who presents for preoperative history and physical with a diagnosis of Left knee fascial dehiscence. [name] PMH: Anemia, Anxiety, Arthritis, CAD, COPD, Depression, Dyspnea, GERD, Hiatal hernia, TKR.    PT Comments    Pt was agreeable to BID treatment today. Pt ambulated to the BR with min guard assist and needed less help with transfers min assist as compared to this morning. Pt reported increase in L knee pain and RN notified. Pt's husband was present for therapy and was very helpful and was agreeable to assist pt at home after d/c. Pt was min assist with LE there ex. Pt is concerned over getting in the house with the steps. She has in the past used a w/c to bump up but that required +2 assist with w/c. Will attempt steps in the morning to evaluate the best and safest technique. Pt will continue to benefit from acute skilled PT to maximize mobility and independence for return home when medically stable.    Recommendations for follow up therapy are one component of a multi-disciplinary discharge planning process, led by the attending physician.  Recommendations may be updated based on patient status, additional functional criteria and insurance authorization.  Follow Up Recommendations  Follow physician's recommendations for discharge plan and follow up therapies     Assistance Recommended at Discharge Intermittent Supervision/Assistance  Patient can return home with the following A little help with walking and/or transfers;Assist for transportation;Help with stairs or ramp for entrance   Equipment Recommendations  None recommended by PT    Recommendations for Other Services OT consult     Precautions / Restrictions Precautions Precautions: Fall;Other (comment) Precaution  Comments: wound vac Required Braces or Orthoses: Knee Immobilizer - Left Knee Immobilizer - Left: On at all times Restrictions Weight Bearing Restrictions: No LLE Weight Bearing: Weight bearing as tolerated     Mobility  Bed Mobility Overal bed mobility: Needs Assistance Bed Mobility: Sit to Supine     Supine to sit: Min assist     General bed mobility comments: cues for technique, assistance lifting L LE Patient Response: Anxious  Transfers Overall transfer level: Needs assistance Equipment used: Rolling walker (2 wheels) Transfers: Sit to/from Stand (Toilet transfer-min assist with RW) Sit to Stand: Min assist, From elevated surface           General transfer comment: cues to increase forward trunk flexion and slide L LE under to stand with KI    Ambulation/Gait Ambulation/Gait assistance: Min guard Gait Distance (Feet): 15 Feet (15 ft x2) Assistive device: Rolling walker (2 wheels) Gait Pattern/deviations: Step-to pattern, Decreased stride length Gait velocity: decr         Stairs Stairs:  (discussed technique, will practice steps on next session)           Wheelchair Mobility    Modified Rankin (Stroke Patients Only)       Balance Overall balance assessment: Needs assistance Sitting-balance support: No upper extremity supported Sitting balance-Leahy Scale: Good     Standing balance support: Bilateral upper extremity supported Standing balance-Leahy Scale: Fair                              Cognition Arousal/Alertness: Awake/alert Behavior During Therapy: WFL for tasks assessed/performed Overall Cognitive Status:  Within Functional Limits for tasks assessed                                 General Comments: fearful of falling, verbal reinforcement needed and relaxation techniques used.        Exercises Total Joint Exercises Ankle Circles/Pumps: AROM, Strengthening, Both, 10 reps, Supine Hip  ABduction/ADduction: AAROM, Strengthening, Left, 10 reps, Supine Straight Leg Raises: AAROM, Strengthening, 10 reps, Supine    General Comments General comments (skin integrity, edema, etc.): Assisted with toilet transfer, Pt's husband was present and assisted with equipment management. Pt with a h/o COPD. Pt reports she needs O2 on a prn basis. Pt ambulated with therapy on RA and when therapy was completed O2 was placed back on. Sats 91% on RA after exercise, but after gait sats 88% on RA.      Pertinent Vitals/Pain Pain Assessment Pain Assessment: 0-10 Pain Score: 8  Pain Location: left knee Pain Descriptors / Indicators: Burning Pain Intervention(s): Limited activity within patient's tolerance, Monitored during session, Repositioned, Ice applied    Home Living Family/patient expects to be discharged to:: Private residence Living Arrangements: Spouse/significant other Available Help at Discharge: Family;Available 24 hours/day Type of Home: House Home Access: Stairs to enter Entrance Stairs-Rails: None Entrance Stairs-Number of Steps: 1-2   Home Layout: One level Home Equipment: Conservation officer, nature (2 wheels);Cane - single point;Wheelchair Probation officer (4 wheels);BSC/3in1      Prior Function            PT Goals (current goals can now be found in the care plan section) Acute Rehab PT Goals Patient Stated Goal: to return home PT Goal Formulation: With patient Time For Goal Achievement: 02/11/23 Potential to Achieve Goals: Good Progress towards PT goals: Progressing toward goals    Frequency    7X/week      PT Plan Current plan remains appropriate    Co-evaluation              AM-PAC PT "6 Clicks" Mobility   Outcome Measure  Help needed turning from your back to your side while in a flat bed without using bedrails?: A Little Help needed moving from lying on your back to sitting on the side of a flat bed without using bedrails?: A Little Help needed moving  to and from a bed to a chair (including a wheelchair)?: A Little Help needed standing up from a chair using your arms (e.g., wheelchair or bedside chair)?: A Little Help needed to walk in hospital room?: A Little Help needed climbing 3-5 steps with a railing? : Total 6 Click Score: 16    End of Session Equipment Utilized During Treatment: Gait belt;Left knee immobilizer Activity Tolerance: Patient limited by pain Patient left: with call bell/phone within reach;in chair;with family/visitor present Nurse Communication: Mobility status;Patient requests pain meds PT Visit Diagnosis: History of falling (Z91.81);Difficulty in walking, not elsewhere classified (R26.2)     Time: [id number] PT Time Calculation (min) (ACUTE ONLY): 34 min  Charges:  $Gait Training: 8-22 mins $Therapeutic Exercise: 8-22 mins                       Lelon Mast 02/04/2023, 12:44 PM

## 2023-02-04 NOTE — Progress Notes (Signed)
    Subjective:  Patient reports pain as mild to moderate.  Denies N/V/CP/SOB/Abd pain. She denies any tingling or numbness in LE bilaterally.   Discussed use of knee immobilizer for the next 2 weeks with ambulation and her drains. All questions solicited and answered.   Objective:   VITALS:   Vitals:   02/04/23 0200 02/04/23 0453 02/04/23 0620 02/04/23 0916  BP:  (!) 167/56    Pulse: (!) 105 (!) 105 100   Resp: 16 18 16   $ Temp:  97.8 F (36.6 C)    TempSrc:  Oral    SpO2: 93% 100% 95% 91%  Weight:      Height:        Patient lying in bed. She is on 3-4 L O2 this morning. NAD.  Neurologically intact ABD soft Neurovascular intact Sensation intact distally Intact pulses distally Dorsiflexion/Plantar flexion intact No cellulitis present Compartment soft Prevena negative pressure dressing: C/D/I. 45mHg no leaks detected.  JP drain: Dressing C/D/I. 95 cc SS fluid.  Hemovac drain: Dressing: C/D/I. 78cc SS/bloody fluid.   Lab Results  Component Value Date   WBC 10.4 02/04/2023   HGB 10.7 (L) 02/04/2023   HCT 34.9 (L) 02/04/2023   MCV 101.2 (H) 02/04/2023   PLT 186 02/04/2023   BMET    Component Value Date/Time   NA 138 02/04/2023 0336   K 4.7 02/04/2023 0336   CL 103 02/04/2023 0336   CO2 26 02/04/2023 0336   GLUCOSE 169 (H) 02/04/2023 0336   BUN 19 02/04/2023 0336   CREATININE 0.96 02/04/2023 0336   CALCIUM 8.5 (L) 02/04/2023 0336   GFRNONAA >60 02/04/2023 0336     Assessment/Plan: 1 Day Post-Op   Principal Problem:   Failed total knee, left (HCC)    WBAT with walker Knee Immobilizer at all times.  DVT ppx: Xarelto 141m SCDs, TEDS PO pain control PT/OT: PT has not seen yet. PT to come by today.  Dispo:  - Continue hemovac and JP drain. - Continue negative pressure incisional dressing.  - Upon discharge, her house VAC suction unit will need to be exchanged for portable Prevena suction unit.   - She will need to call the office to schedule a  follow-up appointment within 7 days for removal of her negative pressure dressing.   AvCharlott RakesPA-C 02/04/2023, 10:51 AM   Em[facility]riad [facility] [address], [place] [address]one: [email or web address] Facebook  [employer]

## 2023-02-04 NOTE — Evaluation (Signed)
Physical Therapy Evaluation Patient Details Name: Norma Anthony MRN: [id number] DOB: 11-Aug-1956 Today's Date: 02/04/2023  History of Present Illness  Norma Anthony is a 67 y.o. female who presents for preoperative history and physical with a diagnosis of Left knee fascial dehiscence. [name] PMH: Anemia, Anxiety, Arthritis, CAD, COPD, Depression, Dyspnea, GERD, Hiatal hernia, TKR.  Clinical Impression  Pt presents with dependencies in mobility secondary to the above diagnosis. Pt stated she prefers to d/c home vs. SNF. Pt stated her spouse can provide some assistance as needed. Pt required some assistance with mobility prior to this admission due to Knee injury and previous fall. Pt is currently mod assist to stand and ambulated a few steps to the recliner with KI and RW min assist. Pt is motivated and will continue to benefit from acute skilled PT to maximize mobility and independence for the next venue of care.      Recommendations for follow up therapy are one component of a multi-disciplinary discharge planning process, led by the attending physician.  Recommendations may be updated based on patient status, additional functional criteria and insurance authorization.  Follow Up Recommendations Follow physician's recommendations for discharge plan and follow up therapies      Assistance Recommended at Discharge Intermittent Supervision/Assistance  Patient can return home with the following  A little help with walking and/or transfers;Assist for transportation;Help with stairs or ramp for entrance    Equipment Recommendations None recommended by PT  Recommendations for Other Services  OT consult    Functional Status Assessment Patient has had a recent decline in their functional status and demonstrates the ability to make significant improvements in function in a reasonable and predictable amount of time.     Precautions / Restrictions  Precautions Precautions: Fall;Other (comment) Precaution Comments: wound vac Required Braces or Orthoses: Knee Immobilizer - Left Knee Immobilizer - Left: On at all times Restrictions Weight Bearing Restrictions: No LLE Weight Bearing: Weight bearing as tolerated      Mobility  Bed Mobility Overal bed mobility: Needs Assistance Bed Mobility: Supine to Sit     Supine to sit: HOB elevated, Min assist     General bed mobility comments: cues for technique, assistance with moving L LE and also some trunk support for initial rise up. Patient Response: Anxious  Transfers Overall transfer level: Needs assistance Equipment used: Rolling walker (2 wheels) Transfers: Sit to/from Stand Sit to Stand: Mod assist           General transfer comment: cues to increase forward trunk flexion and slide L LE under to stand with KI    Ambulation/Gait Ambulation/Gait assistance: Min assist Gait Distance (Feet): 5 Feet Assistive device: Rolling walker (2 wheels) Gait Pattern/deviations: Step-to pattern, Decreased stride length Gait velocity: decr        Stairs            Wheelchair Mobility    Modified Rankin (Stroke Patients Only)       Balance Overall balance assessment: Needs assistance Sitting-balance support: No upper extremity supported Sitting balance-Leahy Scale: Fair     Standing balance support: Bilateral upper extremity supported Standing balance-Leahy Scale: Fair                               Pertinent Vitals/Pain Pain Assessment Pain Assessment: 0-10 Pain Score: 4  Pain Location: left knee Pain Descriptors / Indicators: Burning Pain Intervention(s): Limited activity within patient's  tolerance, Monitored during session, Repositioned, Ice applied    Home Living Family/patient expects to be discharged to:: Private residence Living Arrangements: Spouse/significant other Available Help at Discharge: Family;Available 24 hours/day Type of  Home: House Home Access: Stairs to enter Entrance Stairs-Rails: None Entrance Stairs-Number of Steps: 1-2   Home Layout: One level Home Equipment: Conservation officer, nature (2 wheels);Cane - single point;Wheelchair - Water quality scientist (4 wheels);BSC/3in1      Prior Function Prior Level of Function : Independent/Modified Independent;History of Falls (last six months)                     Hand Dominance        Extremity/Trunk Assessment   Upper Extremity Assessment Upper Extremity Assessment: Defer to OT evaluation    Lower Extremity Assessment Lower Extremity Assessment: Difficult to assess due to impaired cognition LLE Deficits / Details: quads 2/5, ankle WNL LLE: Unable to fully assess due to immobilization LLE Sensation: WNL       Communication   Communication: No difficulties  Cognition Arousal/Alertness: Awake/alert Behavior During Therapy: WFL for tasks assessed/performed Overall Cognitive Status: Within Functional Limits for tasks assessed                                 General Comments: fearful of falling, verbal reinforcement needed and relaxation techniques used.        General Comments      Exercises Total Joint Exercises Ankle Circles/Pumps: AROM, Strengthening, Both, 10 reps, Supine Hip ABduction/ADduction: AAROM, Strengthening, Left, 10 reps, Supine Straight Leg Raises: AAROM, Strengthening, 10 reps, Supine   Assessment/Plan    PT Assessment Patient needs continued PT services  PT Problem List Decreased strength;Decreased balance;Pain;Decreased mobility;Decreased activity tolerance       PT Treatment Interventions DME instruction;Functional mobility training;Balance training;Patient/family education;Gait training;Therapeutic activities;Stair training;Therapeutic exercise    PT Goals (Current goals can be found in the Care Plan section)  Acute Rehab PT Goals Patient Stated Goal: to return home PT Goal Formulation: With patient Time  For Goal Achievement: 02/11/23 Potential to Achieve Goals: Good    Frequency 7X/week     Co-evaluation               AM-PAC PT "6 Clicks" Mobility  Outcome Measure Help needed turning from your back to your side while in a flat bed without using bedrails?: A Little Help needed moving from lying on your back to sitting on the side of a flat bed without using bedrails?: A Little Help needed moving to and from a bed to a chair (including a wheelchair)?: A Lot Help needed standing up from a chair using your arms (e.g., wheelchair or bedside chair)?: A Lot Help needed to walk in hospital room?: A Little Help needed climbing 3-5 steps with a railing? : Total 6 Click Score: 14    End of Session Equipment Utilized During Treatment: Gait belt;Left knee immobilizer Activity Tolerance: Patient tolerated treatment well Patient left: with call bell/phone within reach;in chair Nurse Communication: Mobility status PT Visit Diagnosis: History of falling (Z91.81);Difficulty in walking, not elsewhere classified (R26.2)    Time: 0810-0906 PT Time Calculation (min) (ACUTE ONLY): 56 min   Charges:   PT Evaluation $PT Eval Moderate Complexity: 1 Mod PT Treatments $Gait Training: 8-22 mins $Therapeutic Exercise: 8-22 mins        Lelon Mast 02/04/2023, 9:28 AM

## 2023-02-04 NOTE — Plan of Care (Signed)
Problem: Education: Goal: Knowledge of General Education information will improve Description: Including pain rating scale, medication(s)/side effects and non-pharmacologic comfort measures Outcome: Progressing   Problem: Clinical Measurements: Goal: Ability to maintain clinical measurements within normal limits will improve Outcome: Progressing   Problem: Activity: Goal: Risk for activity intolerance will decrease Outcome: Progressing   Problem: Coping: Goal: Level of anxiety will decrease Outcome: Progressing   Problem: Pain Managment: Goal: General experience of comfort will improve Outcome: [place], RN 02/04/23 8:03 AM

## 2023-02-04 NOTE — Discharge Instructions (Addendum)
Dr. Rod Can Total Joint Specialist [facility] [address] [place], [place] [address] [phone]  TOTAL KNEE REPLACEMENT POSTOPERATIVE DIRECTIONS    Knee Rehabilitation, Guidelines Following Surgery  Results after knee surgery are often greatly improved when you follow the exercise, range of motion and muscle strengthening exercises prescribed by your doctor. Safety measures are also important to protect the knee from further injury. Any time any of these exercises cause you to have increased pain or swelling in your knee joint, decrease the amount until you are comfortable again and slowly increase them. If you have problems or questions, call your caregiver or physical therapist for advice.   WEIGHT BEARING Weight bearing as tolerated with assist device (walker, cane, etc) as directed, use it as long as suggested by your surgeon or therapist, typically at least 4-6 weeks. Wear Knee Immobilizer at all times while ambulating.   HOME CARE INSTRUCTIONS  Remove items at home which could result in a fall. This includes throw rugs or furniture in walking pathways.  Continue medications as instructed at time of discharge. You may have some home medications which will be placed on hold until you complete the course of blood thinner medication.  You may start showering once you are discharged home but do not submerge the incision under water. Just pat the incision dry and apply a dry gauze dressing on daily. Walk with walker as instructed.  You may resume a sexual relationship in one month or when given the OK by your doctor.  Use walker as long as suggested by your caregivers. Avoid periods of inactivity such as sitting longer than an hour when not asleep. This helps prevent blood clots.  You may put full weight on your legs and walk as much as is comfortable.  You may return to work once you are cleared by your doctor.  Do not drive a car for 6 weeks or  until released by you surgeon.  Do not drive while taking narcotics.  Wear the elastic stockings for three weeks following surgery during the day but you may remove then at night. Make sure you keep all of your appointments after your operation with all of your doctors and caregivers. You should call the office at the above phone number and make an appointment for approximately two weeks after the date of your surgery. Do not remove your surgical dressing. The dressing is waterproof; you may take showers in 3 days, but do not take tub baths or submerge the dressing. Please pick up a stool softener and laxative for home use as long as you are requiring pain medications. ICE to the affected knee every three hours for 30 minutes at a time and then as needed for pain and swelling.  Continue to use ice on the knee for pain and swelling from surgery. You may notice swelling that will progress down to the foot and ankle.  This is normal after surgery.  Elevate the leg when you are not up walking on it.   It is important for you to complete the blood thinner medication as prescribed by your doctor. Continue to use the breathing machine which will help keep your temperature down.  It is common for your temperature to cycle up and down following surgery, especially at night when you are not up moving around and exerting yourself.  The breathing machine keeps your lungs expanded and your temperature down.  RANGE OF MOTION AND STRENGTHENING EXERCISES  Rehabilitation of the  knee is important following a knee injury or an operation. After just a few days of immobilization, the muscles of the thigh which control the knee become weakened and shrink (atrophy). Knee exercises are designed to build up the tone and strength of the thigh muscles and to improve knee motion. Often times heat used for twenty to thirty minutes before working out will loosen up your tissues and help with improving the range of motion but do not  use heat for the first two weeks following surgery. These exercises can be done on a training (exercise) mat, on the floor, on a table or on a bed. Use what ever works the best and is most comfortable for you Knee exercises include:  Leg Lifts - While your knee is still immobilized in a splint or cast, you can do straight leg raises. Lift the leg to 60 degrees, hold for 3 sec, and slowly lower the leg. Repeat 10-20 times 2-3 times daily. Perform this exercise against resistance later as your knee gets better.  Quad and Hamstring Sets - Tighten up the muscle on the front of the thigh (Quad) and hold for 5-10 sec. Repeat this 10-20 times hourly. Hamstring sets are done by pushing the foot backward against an object and holding for 5-10 sec. Repeat as with quad sets.  A rehabilitation program following serious knee injuries can speed recovery and prevent re-injury in the future due to weakened muscles. Contact your doctor or a physical therapist for more information on knee rehabilitation.   POST-OPERATIVE OPIOID TAPER INSTRUCTIONS: It is important to wean off of your opioid medication as soon as possible. If you do not need pain medication after your surgery it is ok to stop day one. Opioids include: Codeine, Hydrocodone(Norco, Vicodin), Oxycodone(Percocet, oxycontin) and hydromorphone amongst others.  Long term and even short term use of opiods can cause: Increased pain response Dependence Constipation Depression Respiratory depression And more.  Withdrawal symptoms can include Flu like symptoms Nausea, vomiting And more Techniques to manage these symptoms Hydrate well Eat regular healthy meals Stay active Use relaxation techniques(deep breathing, meditating, yoga) Do Not substitute Alcohol to help with tapering If you have been on opioids for less than two weeks and do not have pain than it is ok to stop all together.  Plan to wean off of opioids This plan should start within one week  post op of your joint replacement. Maintain the same interval or time between taking each dose and first decrease the dose.  Cut the total daily intake of opioids by one tablet each day Next start to increase the time between doses. The last dose that should be eliminated is the evening dose.    SKILLED REHAB INSTRUCTIONS: If the patient is transferred to a skilled rehab facility following release from the hospital, a list of the current medications will be sent to the facility for the patient to continue.  When discharged from the skilled rehab facility, please have the facility set up the patient's [place] prior to being released. Also, the skilled facility will be responsible for providing the patient with their medications at time of release from the facility to include their pain medication, the muscle relaxants, and their blood thinner medication. If the patient is still at the rehab facility at time of the two week follow up appointment, the skilled rehab facility will also need to assist the patient in arranging follow up appointment in our office and any transportation needs.  MAKE  SURE YOU:  Understand these instructions.  Will watch your condition.  Will get help right away if you are not doing well or get worse.    Pick up stool softner and laxative for home use following surgery while on pain medications. Do NOT remove your dressing. Keep incision clean and dry.  Do not take tub baths or submerge incision under water. Continue to use ice for pain and swelling after surgery. Do not use any lotions or creams on the incision until instructed by your surgeon. Please charge your prevena wound vac daily/nightly.  Weightbearing as tolerated on left lower extremity with Knee Immobilizer at all times to protect your incision.  Empty your JP drain bulb every 4 hours and record the amount of output. Make sure you recharge the drain bulb. Keep a log of the amount of output  from your drain daily. Please bring your log with you to your next appointment in the office.  Follow-up within 1 week of discharge from the hospital for removal of your Prevena negative pressure dressing.

## 2023-02-05 LAB — CBC
HCT: 31.8 % — ABNORMAL LOW (ref 36.0–46.0)
Hemoglobin: 9.8 g/dL — ABNORMAL LOW (ref 12.0–15.0)
MCH: 31.1 pg (ref 26.0–34.0)
MCHC: 30.8 g/dL (ref 30.0–36.0)
MCV: 101 fL — ABNORMAL HIGH (ref 80.0–100.0)
Platelets: 165 10*3/uL (ref 150–400)
RBC: 3.15 MIL/uL — ABNORMAL LOW (ref 3.87–5.11)
RDW: 13.9 % (ref 11.5–15.5)
WBC: 9.6 10*3/uL (ref 4.0–10.5)
nRBC: 0 % (ref 0.0–0.2)

## 2023-02-05 MED ORDER — DOCUSATE SODIUM 100 MG PO CAPS: 100.0000 mg | ORAL_CAPSULE | Freq: Two times a day (BID) | ORAL | 0 refills | Status: AC

## 2023-02-05 MED ORDER — SENNA 8.6 MG PO TABS: 2.0000 | ORAL_TABLET | Freq: Every day | ORAL | 0 refills | Status: AC

## 2023-02-05 MED ORDER — POLYETHYLENE GLYCOL 3350 17 G PO PACK: 17.0000 g | PACK | Freq: Every day | ORAL | 0 refills | Status: AC | PRN

## 2023-02-05 MED ORDER — ONDANSETRON HCL 4 MG PO TABS: 4.0000 mg | ORAL_TABLET | Freq: Three times a day (TID) | ORAL | 0 refills | Status: DC | PRN

## 2023-02-05 MED ORDER — OXYCODONE HCL 5 MG PO TABS: 5.0000 mg | ORAL_TABLET | Freq: Four times a day (QID) | ORAL | 0 refills | Status: DC | PRN

## 2023-02-05 NOTE — Plan of Care (Signed)
Problem: Education: Goal: Knowledge of General Education information will improve Description: Including pain rating scale, medication(s)/side effects and non-pharmacologic comfort measures Outcome: Progressing   Problem: Clinical Measurements: Goal: Ability to maintain clinical measurements within normal limits will improve Outcome: Progressing   Problem: Activity: Goal: Risk for activity intolerance will decrease Outcome: Progressing   Problem: Pain Managment: Goal: General experience of comfort will improve Outcome: [place], RN 02/05/23 9:45 AM

## 2023-02-05 NOTE — TOC Initial Note (Addendum)
Transition of Care [facility]) - Initial/Assessment Note    Patient Details  Name: Norma Anthony MRN: [id number] Date of Birth: 1956-09-27  Transition of Care [facility]) CM/SW Contact:    Henrietta Dine, RN Phone Number: 02/05/2023, 1:45 PM  Clinical Narrative:                 Donella Stade cpnsult for d/c needs; spoke w/ pt and husband Tiernan Odonald in room; he is her POC [phone]); she has transportation; pt says she is from home and plans to return at d/c; pt denies IPV, food insecurity, and difficulty paying utilities; pt wears reading glasses and has upper dentures; she does not have HA; pt says she has BSC, Rolator, cane, and walker; pt does not have [place] services; pt has home oxygen w/ [employer]; her travel tank is not with her; notified Erasmo Downer at [employer] and the agency will deliver travel tank to room; pt/husband notified; pt's husband says she is to make follow up appt w/ MD next week; secure chat sent to [employer] to see if additional orders required; awaiting response; spoke w/ Levada Dy, RN; she says hemovac was removed, prevena has been placed; she also says pt's husband was educated on JP management; pt/husband informed she will need to be seen in office 02/12/23; no TOC needs.  -[id number]- contacted by Krisitin at [place]; she says she cannot locate pt account for oxygen; spoke w/ pt in room; pt says she got oxygen thru [employer] at [facility]; Erasmo Downer says she is unable to check insure for billing; pt declines this CM contacting insurance to see who is billed; pt says she uses oxygen as needed and she d/c home without travel tank; pt says she will contact her insurance company when she gets home; no TOC needs.        Patient Goals and CMS Choice            Expected Discharge Plan and Services         Expected Discharge Date: 02/05/23                                    Prior Living Arrangements/Services                       Activities of Daily Living Home Assistive  Devices/Equipment: Blood pressure cuff, Bedside commode/3-in-1, Cane (specify quad or straight), Dentures (specify type), Eyeglasses, Shower chair without back, Walker (specify type) ADL Screening (condition at time of admission) Patient's cognitive ability adequate to safely complete daily activities?: Yes Is the patient deaf or have difficulty hearing?: No Does the patient have difficulty seeing, even when wearing glasses/contacts?: No Does the patient have difficulty concentrating, remembering, or making decisions?: No Patient able to express need for assistance with ADLs?: Yes Does the patient have difficulty dressing or bathing?: Yes Independently performs ADLs?: No Communication: Independent Dressing (OT): Needs assistance Is this a change from baseline?: Pre-admission baseline Grooming: Needs assistance Is this a change from baseline?: Pre-admission baseline Feeding: Independent Bathing: Needs assistance Is this a change from baseline?: Pre-admission baseline Toileting: Independent In/Out Bed: Needs assistance Is this a change from baseline?: Pre-admission baseline Walks in Home: Needs assistance Is this a change from baseline?: Pre-admission baseline Does the patient have difficulty walking or climbing stairs?: Yes Weakness of Legs: Both Weakness of Arms/Hands: Both  Permission Sought/Granted  Emotional Assessment              Admission diagnosis:  Failed total knee, left [facility]) [T84.093A] Patient Active Problem List   Diagnosis Date Noted   Failed total knee, 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   Patellar dislocation 12/11/2022   Hemarthrosis of left knee 12/11/2022   Wound dehiscence, surgical, initial encounter 12/11/2022   Chronic respiratory failure with hypoxia ([place]) 12/11/2022   Generalized anxiety disorder 12/11/2022   Patellar dislocation, left, initial encounter 12/11/2022   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   S/P TKR  (total knee replacement) not using cement 11/04/2022   DOE (dyspnea on exertion) 04/30/2022   Former smoker 04/30/2022   Upper airway cough syndrome 04/30/2022   Lung nodule 03/24/2022   Thyroid nodule 03/24/2022   Acute respiratory failure with hypoxia ([place]) 03/23/2022   Opioid dependence ([place]) 03/23/2022   Fibromyalgia 03/23/2022   OSA (obstructive sleep apnea) 03/23/2022   History of pulmonary embolus (PE) 03/23/2022   COPD (chronic obstructive pulmonary disease) ([place]) 03/22/2022   Chest pain 03/22/2022   GERD without esophagitis 03/22/2022   PCP:  Clinic, [place]:   [address] [place] [address] Phone: [phone] Fax: [phone]     Social Determinants of Health (SDOH) Social History: SDOH Screenings   Food Insecurity: No Food Insecurity (02/05/2023)  Housing: Low Risk  (02/05/2023)  Transportation Needs: No Transportation Needs (02/05/2023)  Utilities: Not At Risk (02/05/2023)  Tobacco Use: Medium Risk (02/04/2023)   SDOH Interventions: Food Insecurity Interventions: Inpatient TOC Housing Interventions: Inpatient TOC Transportation Interventions: Inpatient TOC Utilities Interventions: Inpatient TOC   Readmission Risk Interventions    11/06/2022    9:37 AM  Readmission Risk Prevention Plan  Transportation Screening Complete  PCP or Specialist Appt within 3-5 Days Complete  HRI or [place] Complete  Social Work Consult for [place] Planning/Counseling Complete  Palliative Care Screening Not Applicable  Medication Review [employer]) Complete

## 2023-02-05 NOTE — Progress Notes (Signed)
    Subjective:  Patient reports pain as mild to moderate.  Denies N/V/CP/SOB/Abd pain. She endorses a headache this morning. We discussed to continue plenty of fluids PO. Continue spirometry use.   Objective:   VITALS:   Vitals:   02/04/23 1830 02/04/23 2133 02/05/23 0148 02/05/23 0542  BP: (!) 148/64 135/66 110/64 (!) 113/57  Pulse: (!) 101 100 77 94  Resp: 18 18 16 18  $ Temp: 97.8 F (36.6 C) 99 F (37.2 C) 98.4 F (36.9 C) 98.5 F (36.9 C)  TempSrc:  Oral Oral Oral  SpO2: 96% 100% 95% 95%  Weight:      Height:        NAD Neurologically intact ABD soft Neurovascular intact Sensation intact distally Intact pulses distally Dorsiflexion/Plantar flexion intact No cellulitis present Compartment soft Prevena negative pressure dressing: C/D/I. 14mHg no leaks detected.  Hemovac drain: Dressing: C/D/I. 20cc SS/bloody fluid. Drain was removed today.  JP drain: Dressing C/D/I. 10 cc SS fluid. Dressing changed. Continue JP drain.   Lab Results  Component Value Date   WBC 9.6 02/05/2023   HGB 9.8 (L) 02/05/2023   HCT 31.8 (L) 02/05/2023   MCV 101.0 (H) 02/05/2023   PLT 165 02/05/2023   BMET    Component Value Date/Time   NA 138 02/04/2023 0336   K 4.7 02/04/2023 0336   CL 103 02/04/2023 0336   CO2 26 02/04/2023 0336   GLUCOSE 169 (H) 02/04/2023 0336   BUN 19 02/04/2023 0336   CREATININE 0.96 02/04/2023 0336   CALCIUM 8.5 (L) 02/04/2023 0336   GFRNONAA >60 02/04/2023 0336     Assessment/Plan: 2 Days Post-Op   Principal Problem:   Failed total knee, left (HCC)  ABLA. Hemoglobin 9.8. Continue to monitor.   WBAT with walker and Knee immobilizer at all times.  DVT ppx: Xarelto, SCDs, TEDS PO pain control PT/OT: Patient ambulated 15 feet with PT yesterday.  Dispo:  - Patient can d/c home once cleared with PT.  - Hemovac drain removed today.  - JP drain continued.  - Continue negative pressure incisional dressing.  - Upon discharge, her house VAC suction  unit will need to be exchanged for portable Prevena suction unit.   - She will need to call the office to schedule a follow-up appointment within 7 days for removal of her negative pressure dressing.    ACharlott Rakes PA-C 02/05/2023, 7:57 AM   [facility] [address], [place] [id number]: [phone]  [employer]

## 2023-02-05 NOTE — Progress Notes (Signed)
Physical Therapy Treatment Patient Details Name: Norma Anthony MRN: [id number] DOB: Sep 14, 1956 Today's Date: 02/05/2023   History of Present Illness Norma Anthony is a 67 y.o. female who presents for preoperative history and physical with a diagnosis of Left knee fascial dehiscence. [name] PMH: Anemia, Anxiety, Arthritis, CAD, COPD, Depression, Dyspnea, GERD, Hiatal hernia, TKR.    PT Comments    Pt is mobilizing well this session, incr gait distance, able to ascend stairs with min/guard assist only.  Pt has met PT goals and is ready to d/c form PT standpoint with family/husband assist as needed.  Pt c/o she doesn't feel well, has a HA and talks of multiple life stressors. Barriers to d/c are not PT related, it sounds as if pt would benefit from OP Psych Consult. RN is aware.     Recommendations for follow up therapy are one component of a multi-disciplinary discharge planning process, led by the attending physician.  Recommendations may be updated based on patient status, additional functional criteria and insurance authorization.  Follow Up Recommendations  Follow physician's recommendations for discharge plan and follow up therapies     Assistance Recommended at Discharge Intermittent Supervision/Assistance  Patient can return home with the following A little help with walking and/or transfers;Assist for transportation;Help with stairs or ramp for entrance   Equipment Recommendations  None recommended by PT    Recommendations for Other Services       Precautions / Restrictions Precautions Precautions: Fall;Knee;Other (comment) Precaution Comments: wound vac Required Braces or Orthoses: Knee Immobilizer - Left Knee Immobilizer - Left: On at all times Restrictions Weight Bearing Restrictions: No LLE Weight Bearing: Weight bearing as tolerated     Mobility  Bed Mobility Overal bed mobility: Needs Assistance Bed Mobility: Supine to Sit      Supine to sit: Min guard, Min assist     General bed mobility comments: cues for technique, assistance to initiate  L LE toward EOB    Transfers Overall transfer level: Needs assistance Equipment used: Rolling walker (2 wheels) Transfers: Sit to/from Stand Sit to Stand: Min guard           General transfer comment: cues to increase forward trunk flexion and slide L LE under to stand with KI; min/guard from EOB at lowest setting; pt uses slight momentum to stand    Ambulation/Gait Ambulation/Gait assistance: Min guard Gait Distance (Feet): 70 Feet Assistive device: Rolling walker (2 wheels) Gait Pattern/deviations: Step-to pattern, Step-through pattern, Decreased stride length       General Gait Details: verbal cues for trunk extension, no LOB, no knee bucklingor instability noted with KI in place   Stairs Stairs: Yes Stairs assistance: Min guard, Supervision Stair Management: Two rails, Step to pattern, Forwards, With walker Number of Stairs: 3 General stair comments: pt has 2 short platform steps at home; ascended 3 short steps, 2 with hand rails and one with RW to simulate home; no LOB, min/guard for safety only, no physical assist. KI in place. questioning cues for sequence   Wheelchair Mobility    Modified Rankin (Stroke Patients Only)       Balance   Sitting-balance support: No upper extremity supported Sitting balance-Leahy Scale: Good     Standing balance support: During functional activity, Reliant on assistive device for balance Standing balance-Leahy Scale: Fair (reliant on device for dynamic)  Cognition Arousal/Alertness: Awake/alert Behavior During Therapy: WFL for tasks assessed/performed Overall Cognitive Status: Within Functional Limits for tasks assessed                                          Exercises      General Comments        Pertinent Vitals/Pain Pain  Assessment Pain Assessment: Faces Faces Pain Scale: Hurts little more Pain Location: left knee an dHA Pain Descriptors / Indicators: Discomfort, Aching Pain Intervention(s): Limited activity within patient's tolerance, Monitored during session, Premedicated before session, Repositioned (declined ice ("can't feel it"))    Home Living                          Prior Function            PT Goals (current goals can now be found in the care plan section) Acute Rehab PT Goals Patient Stated Goal: to return home PT Goal Formulation: With patient Time For Goal Achievement: 02/11/23 Potential to Achieve Goals: Good Progress towards PT goals: Progressing toward goals    Frequency    7X/week      PT Plan Current plan remains appropriate    Co-evaluation              AM-PAC PT "6 Clicks" Mobility   Outcome Measure  Help needed turning from your back to your side while in a flat bed without using bedrails?: A Little Help needed moving from lying on your back to sitting on the side of a flat bed without using bedrails?: A Little Help needed moving to and from a bed to a chair (including a wheelchair)?: A Little Help needed standing up from a chair using your arms (e.g., wheelchair or bedside chair)?: A Little Help needed to walk in hospital room?: A Little Help needed climbing 3-5 steps with a railing? : A Little 6 Click Score: 18    End of Session Equipment Utilized During Treatment: Gait belt;Left knee immobilizer Activity Tolerance: Patient tolerated treatment well Patient left: with call bell/phone within reach;in chair;with family/visitor present Nurse Communication: Mobility status;Patient requests pain meds PT Visit Diagnosis: History of falling (Z91.81);Difficulty in walking, not elsewhere classified (R26.2)     Time: [id number] PT Time Calculation (min) (ACUTE ONLY): 30 min  Charges:  $Gait Training: 23-37 mins                     Baxter Flattery, PT  Acute  Rehab Dept [facility]) [phone]  [facility] Weekend Pager [facility] only)  [phone]  02/05/2023    [facility] 02/05/2023, 11:28 AM

## 2023-02-05 NOTE — Plan of Care (Signed)
Patient discharging home via private vehicle with husband. Discharge following discussion with Larene Pickett, PA regarding patient passing PT session and patient desire to return home.   Education provided to patient and husband regarding emptying, activating, and measuring of JP drainage. Education regarding care of wound vac and charging of [place]. Husband Abella Euceda verbalizes understanding of education/return demonstration appropriately. Instructed that patient should be seen in Dr. Sid Falcon office by Friday, March 1st.  Ivan Anchors, RN 02/05/23 2:22 PM

## 2023-02-10 NOTE — Discharge Summary (Signed)
Physician Discharge Summary  Patient ID: Norma Anthony MRN: [id number] DOB/AGE: April 08, 1956 67 y.o.  Admit date: 02/03/2023 Discharge date: 02/05/2023  Admission Diagnoses:  Failed total knee, left ([place])  Discharge Diagnoses:  Principal Problem:   Failed total knee, left ([place])   Past Medical History:  Diagnosis Date   Anemia    Anxiety    Arthritis    CAD (coronary artery disease)    COPD (chronic obstructive pulmonary disease) (HCC)    Depression    Dyspnea    Fibromyalgia    GERD (gastroesophageal reflux disease)    Hiatal hernia    History of kidney stones    Migraine headache    Multinodular goiter    Nephrolithiasis    Pneumonia    Pulmonary embolism ([place]) 1996   Sleep apnea    Thyroid nodule     Surgeries: Procedure(s): KNEE ARTHROTOMY WITH SOFT TISSUE REPAIR on 02/03/2023   Consultants (if any):   Discharged Condition: Improved  Hospital Course: Norma Anthony is an 67 y.o. female who was admitted 02/03/2023 with a diagnosis of Failed total knee, left ([place]) and went to the operating room on 02/03/2023 and underwent the above named procedures.    She was given perioperative antibiotics:  Anti-infectives (From admission, onward)    Start     Dose/Rate Route Frequency Ordered Stop   02/03/23 2200  ceFAZolin (ANCEF) IVPB 2g/100 mL premix        2 g 200 mL/hr over 30 Minutes Intravenous Every 6 hours 02/03/23 2005 02/04/23 0351   02/03/23 1115  ceFAZolin (ANCEF) IVPB 2g/100 mL premix        2 g 200 mL/hr over 30 Minutes Intravenous On call to O.R. 02/03/23 1111 02/03/23 1543   02/03/23 1115  vancomycin (VANCOREADY) IVPB 1500 mg/300 mL        1,500 mg 150 mL/hr over 120 Minutes Intravenous On call to O.R. 02/03/23 1111 02/04/23 1250       She was given sequential compression devices, early ambulation, and aspirin for DVT prophylaxis.  POD#1 She ambulated 15 feet with PT. Hemovac drain 78 cc SS output, JP drain 95 cc SS output.  POD#2 Hemovac drain was removed. JP  drain 10cc SS output. She ambulated 70 feet with PT. She was discharged home with negative prevena negative pressure dressing and JP drain.   She benefited maximally from the hospital stay and there were no complications.    Recent vital signs:  Vitals:   02/05/23 0542 02/05/23 1323  BP: (!) 113/57 126/62  Pulse: 94 (!) 103  Resp: 18 16  Temp: 98.5 F (36.9 C) 98.4 F (36.9 C)  SpO2: 95% 99%    Recent laboratory studies:  Lab Results  Component Value Date   HGB 9.8 (L) 02/05/2023   HGB 10.7 (L) 02/04/2023   HGB 12.6 02/02/2023   Lab Results  Component Value Date   WBC 9.6 02/05/2023   PLT 165 02/05/2023   Lab Results  Component Value Date   INR 1.4 (H) 12/11/2022   Lab Results  Component Value Date   NA 138 02/04/2023   K 4.7 02/04/2023   CL 103 02/04/2023   CO2 26 02/04/2023   BUN 19 02/04/2023   CREATININE 0.96 02/04/2023   GLUCOSE 169 (H) 02/04/2023     Allergies as of 02/05/2023       Reactions   Lyrica [pregabalin] Other (See Comments)   Paralysis present (finding)- not tolerated Pt stated she could not move her body  but she could think    Belbuca [buprenorphine Hcl] Other (See Comments)   " Horrible headache"    Compazine [prochlorperazine] Anxiety   Droperidol Anxiety   Ergotamine-caffeine Anxiety, Hypertension   Increased systolic arterial pressure (finding)        Medication List     STOP taking these medications    aspirin EC 81 MG tablet   GOODY HEADACHE PO   oxyCODONE-acetaminophen 10-325 MG tablet Commonly known as: PERCOCET       TAKE these medications    acetaminophen 500 MG tablet Commonly known as: TYLENOL Take 1,000 mg by mouth every 6 (six) hours as needed for moderate pain.   albuterol 108 (90 Base) MCG/ACT inhaler Commonly known as: VENTOLIN HFA Inhale 2 puffs into the lungs every 6 (six) hours as needed for wheezing or shortness of breath.   ALPRAZolam 1 MG tablet Commonly known as: XANAX Take 1 mg by mouth  every 6 (six) hours as needed for anxiety.   b complex vitamins capsule Take 1 capsule by mouth daily.   clindamycin-benzoyl peroxide gel Commonly known as: BENZACLIN Apply 1 Application topically 2 (two) times daily as needed (for acne breakouts).   clotrimazole-betamethasone cream Commonly known as: LOTRISONE Apply 1 Application topically 2 (two) times daily as needed (for irritation under the breasts or abdominal folds).   Corn & Callus Remover 17 % external solution Generic drug: salicylic acid-lactic acid Apply 1 Application topically daily as needed (corns).   diphenhydrAMINE 25 MG tablet Commonly known as: SOMINEX Take 50-75 mg by mouth daily as needed for allergies.   docusate sodium 100 MG capsule Commonly known as: Colace Take 1 capsule (100 mg total) by mouth 2 (two) times daily.   DULoxetine 60 MG capsule Commonly known as: CYMBALTA Take 60 mg by mouth at bedtime.   famotidine 20 MG tablet Commonly known as: Pepcid One after supper   Ferrous Fumarate-Folic Acid 99991111 MG Tabs Take 1 tablet by mouth daily. Hematinic   gabapentin 400 MG capsule Commonly known as: NEURONTIN Take 400 mg by mouth 3 (three) times daily.   Hair Skin and Nails Formula Tabs Take 1 tablet by mouth daily.   melatonin 5 MG Tabs Take 10 mg by mouth at bedtime.   naloxone 4 MG/0.1ML Liqd nasal spray kit Commonly known as: NARCAN Place 1 spray into the nose once as needed (accidental over dose).   ondansetron 4 MG tablet Commonly known as: Zofran Take 1 tablet (4 mg total) by mouth every 8 (eight) hours as needed for nausea or vomiting.   oxyCODONE 5 MG immediate release tablet Commonly known as: Roxicodone Take 1-2 tablets (5-10 mg total) by mouth every 6 (six) hours as needed for severe pain.   pantoprazole 40 MG tablet Commonly known as: PROTONIX Take 40 mg by mouth daily with breakfast.   polyethylene glycol 17 g packet Commonly known as: MiraLax Take 17 g by mouth  daily as needed for mild constipation or moderate constipation.   promethazine 25 MG tablet Commonly known as: PHENERGAN Take 25 mg by mouth every 6 (six) hours as needed for nausea or vomiting.   senna 8.6 MG Tabs tablet Commonly known as: SENOKOT Take 2 tablets (17.2 mg total) by mouth at bedtime for 15 days.   tiZANidine 2 MG tablet Commonly known as: ZANAFLEX Take 2 mg by mouth every 12 (twelve) hours as needed for muscle spasms.   Xarelto 20 MG Tabs tablet Generic drug: rivaroxaban Take 20 mg by mouth  at bedtime.               Discharge Care Instructions  (From admission, onward)           Start     Ordered   02/05/23 0000  Weight bearing as tolerated       Comments: Knee immobilizer at all times.  Question Answer Comment  Laterality left   Extremity Lower      02/05/23 1308   02/05/23 0000  Change dressing       Comments: Do not remove your dressing.   02/05/23 1308              WEIGHT BEARING   Weight bearing as tolerated with assist device (walker, cane, etc) as directed, use it as long as suggested by your surgeon or therapist, typically at least 4-6 weeks. WBAT w/ walker Knee Immobilizer at all times    EXERCISES  Results after joint replacement surgery are often greatly improved when you follow the exercise, range of motion and muscle strengthening exercises prescribed by your doctor. Safety measures are also important to protect the joint from further injury. Any time any of these exercises cause you to have increased pain or swelling, decrease what you are doing until you are comfortable again and then slowly increase them. If you have problems or questions, call your caregiver or physical therapist for advice.   Rehabilitation is important following a joint replacement. After just a few days of immobilization, the muscles of the leg can become weakened and shrink (atrophy).  These exercises are designed to build up the tone and strength of  the thigh and leg muscles and to improve motion. Often times heat used for twenty to thirty minutes before working out will loosen up your tissues and help with improving the range of motion but do not use heat for the first two weeks following surgery (sometimes heat can increase post-operative swelling).   These exercises can be done on a training (exercise) mat, on the floor, on a table or on a bed. Use whatever works the best and is most comfortable for you.    Use music or television while you are exercising so that the exercises are a pleasant break in your day. This will make your life better with the exercises acting as a break in your routine that you can look forward to.   Perform all exercises about fifteen times, three times per day or as directed.  You should exercise both the operative leg and the other leg as well.  Exercises include:   Quad Sets - Tighten up the muscle on the front of the thigh (Quad) and hold for 5-10 seconds.   Straight Leg Raises - With your knee straight (if you were given a brace, keep it on), lift the leg to 60 degrees, hold for 3 seconds, and slowly lower the leg.  Perform this exercise against resistance later as your leg gets stronger.  Leg Slides: Lying on your back, slowly slide your foot toward your buttocks, bending your knee up off the floor (only go as far as is comfortable). Then slowly slide your foot back down until your leg is flat on the floor again.  Angel Wings: Lying on your back spread your legs to the side as far apart as you can without causing discomfort.  Hamstring Strength:  Lying on your back, push your heel against the floor with your leg straight by tightening up the muscles of your buttocks.  Repeat, but this time bend your knee to a comfortable angle, and push your heel against the floor.  You may put a pillow under the heel to make it more comfortable if necessary.   A rehabilitation program following joint replacement surgery can speed  recovery and prevent re-injury in the future due to weakened muscles. Contact your doctor or a physical therapist for more information on knee rehabilitation.    CONSTIPATION  Constipation is defined medically as fewer than three stools per week and severe constipation as less than one stool per week.  Even if you have a regular bowel pattern at home, your normal regimen is likely to be disrupted due to multiple reasons following surgery.  Combination of anesthesia, postoperative narcotics, change in appetite and fluid intake all can affect your bowels.   YOU MUST use at least one of the following options; they are listed in order of increasing strength to get the job done.  They are all available over the counter, and you may need to use some, POSSIBLY even all of these options:    Drink plenty of fluids (prune juice may be helpful) and high fiber foods Colace 100 mg by mouth twice a day  Senokot for constipation as directed and as needed Dulcolax (bisacodyl), take with full glass of water  Miralax (polyethylene glycol) once or twice a day as needed.  If you have tried all these things and are unable to have a bowel movement in the first 3-4 days after surgery call either your surgeon or your primary doctor.    If you experience loose stools or diarrhea, hold the medications until you stool forms back up.  If your symptoms do not get better within 1 week or if they get worse, check with your doctor.  If you experience "the worst abdominal pain ever" or develop nausea or vomiting, please contact the office immediately for further recommendations for treatment.   ITCHING:  If you experience itching with your medications, try taking only a single pain pill, or even half a pain pill at a time.  You can also use Benadryl over the counter for itching or also to help with sleep.   TED HOSE STOCKINGS:  Use stockings on both legs until for at least 2 weeks or as directed by physician office. They may be  removed at night for sleeping.  MEDICATIONS:  See your medication summary on the "After Visit Summary" that nursing will review with you.  You may have some home medications which will be placed on hold until you complete the course of blood thinner medication.  It is important for you to complete the blood thinner medication as prescribed.  PRECAUTIONS:  If you experience chest pain or shortness of breath - call 911 immediately for transfer to the hospital emergency department.   If you develop a fever greater that 101 F, purulent drainage from wound, increased redness or drainage from wound, foul odor from the wound/dressing, or calf pain - CONTACT YOUR SURGEON.                                                   FOLLOW-UP APPOINTMENTS:  If you do not already have a post-op appointment, please call the office for an appointment to be seen by your surgeon.  Guidelines for how soon to be seen are  listed in your "After Visit Summary", but are typically between 1-4 weeks after surgery.  OTHER INSTRUCTIONS:   Knee Replacement:  Do not place pillow under knee, focus on keeping the knee straight while resting. CPM instructions: 0-90 degrees, 2 hours in the morning, 2 hours in the afternoon, and 2 hours in the evening. Place foam block, curve side up under heel at all times except when in CPM or when walking.  DO NOT modify, tear, cut, or change the foam block in any way.   MAKE SURE YOU:  Understand these instructions.  Get help right away if you are not doing well or get worse.    Thank you for letting us be a part of your medical care team.  It is a privilege we respect greatly.  We [place] these instructions will help you stay on track for a fast and full recovery!   Diagnostic Studies: DG Knee Left Port  Result Date: 02/03/2023 CLINICAL DATA:  Status post total left knee arthroplasty. EXAM: PORTABLE LEFT KNEE - 1-2 VIEW COMPARISON:  Left knee radiographs 12/11/2022, 11/04/2022; CT left knee  12/11/2022 FINDINGS: Postsurgical changes are again seen of total left knee arthroplasty. Previously surgery was performed 12/10/2022 for relocation of the patella. On the current exam the patella remains normally aligned. No perihardware lucency is seen to indicate hardware failure or loosening. There are anterior surgical skin staples, different from the prior surgical skin staples. Mild-to-moderate joint effusion. Anterior subcutaneous air expected postoperative changes. New lateral knee surgical drain. New vacuum assisted drain at the prepatellar soft tissues. IMPRESSION: Redemonstration of total left knee arthroplasty. Appropriate alignment. Apparent new surgery compared to 12/11/2022 surgery when the arthroplasty hardware was present and the patella was relocated. Electronically Signed   By: Yvonne Kendall M.D.   On: 02/03/2023 18:53   PERIPHERAL VASCULAR CATHETERIZATION  Result Date: 01/18/2023 Images from the original result were not included. Patient name: Norma Anthony MRN: [id number] DOB: September 27, 1956 Sex: female 01/18/2023 Pre-operative Diagnosis: presence of IVC filter, history of DVT Post-operative diagnosis:  Same Surgeon:  Eda Paschal. Donzetta Matters, MD Procedure Performed: 1.  Ultrasound-guided cannulation right internal jugular vein 2.  Removal of IVC filter 3.  Central venogram 4.  Moderate sedation with fentanyl and Versed for 15 minutes Indications: 67 year old female with history of IVC filter placement for total knee arthroplasty which she is now recovering from the most recent being on the left.  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  Xarelto has been held for 48 hours with plan for IVC filter removal today. Findings: The IVC filter hook was midline in the IVC this was easily snared filter was removed intact and central venogram demonstrated no active extravasation and patient was hemodynamically stable throughout the case.  Procedure:   The patient was identified in the holding area and taken to room 7.  The patient was then placed supine on the table and prepped and draped in the usual sterile fashion.  A time out was called.  Ultrasound was used to evaluate right internal jugular vein which was patent and compressible.  The area was anesthetized with 1% lidocaine cannulated with a micropuncture needle followed by wire and sheath.  A Bentson wire was then placed into the IVC under fluoroscopic guidance.  An ultrasound image was saved to the permanent record.  We dilated the wire tract with 8 French dilator and then placed the retrieval sheath under fluoroscopic guidance.  The filter  was snared and the retrieval sheath was advanced over the filter.  The filter was removed and was noted to be fully intact.  Central venogram demonstrated no active extravasation with patent IVC.  The retrieval sheath was removed and pressure held for hemostasis obtained.  Patient tolerated procedure without any complication. Contrast: 15cc Brandon C. Donzetta Matters, MD [facility] Office: [phone] Pager: [phone]    Disposition: Discharge disposition: 01-Home or Self Care       Discharge Instructions     Call MD / Call 911   Complete by: As directed    If you experience chest pain or shortness of breath, CALL 911 and be transported to the hospital emergency room.  If you develope a fever above 101 F, pus (white drainage) or increased drainage or redness at the wound, or calf pain, call your surgeon's office.   Change dressing   Complete by: As directed    Do not remove your dressing.   Constipation Prevention   Complete by: As directed    Drink plenty of fluids.  Prune juice may be helpful.  You may use a stool softener, such as Colace (over the counter) 100 mg twice a day.  Use MiraLax (over the counter) for constipation as needed.   Diet - low sodium heart healthy   Complete by: As directed    Discharge  instructions   Complete by: As directed    Elevate toes above nose. Use cryotherapy as needed for pain and swelling.   Do not put a pillow under the knee. Place it under the heel.   Complete by: As directed    Driving restrictions   Complete by: As directed    No driving for 6 weeks   Increase activity slowly as tolerated   Complete by: As directed    Lifting restrictions   Complete by: As directed    No lifting for 6 weeks   Post-operative opioid taper instructions:   Complete by: As directed    POST-OPERATIVE OPIOID TAPER INSTRUCTIONS: It is important to wean off of your opioid medication as soon as possible. If you do not need pain medication after your surgery it is ok to stop day one. Opioids include: Codeine, Hydrocodone(Norco, Vicodin), Oxycodone(Percocet, oxycontin) and hydromorphone amongst others.  Long term and even short term use of opiods can cause: Increased pain response Dependence Constipation Depression Respiratory depression And more.  Withdrawal symptoms can include Flu like symptoms Nausea, vomiting And more Techniques to manage these symptoms Hydrate well Eat regular healthy meals Stay active Use relaxation techniques(deep breathing, meditating, yoga) Do Not substitute Alcohol to help with tapering If you have been on opioids for less than two weeks and do not have pain than it is ok to stop all together.  Plan to wean off of opioids This plan should start within one week post op of your joint replacement. Maintain the same interval or time between taking each dose and first decrease the dose.  Cut the total daily intake of opioids by one tablet each day Next start to increase the time between doses. The last dose that should be eliminated is the evening dose.      TED hose   Complete by: As directed    Use stockings (TED hose) for 2 weeks on both leg(s).  You may remove them at night for sleeping.   Weight bearing as tolerated   Complete by: As  directed    Knee immobilizer at all  times.   Laterality: left   Extremity: Lower        Follow-up Information     Charlott Rakes, PA-C Follow up on 02/12/2023.   Specialty: Orthopedic Surgery Why: Follow-up within 7 days of discharge for wound re-check and removal of Prevena negative pressure dressing. Contact information: [address] [place] [address] [id number]                  Signed: Charlott Rakes, PA-C 02/10/2023, 9:57 AM

## 2023-03-08 ENCOUNTER — Other Ambulatory Visit: Payer: Self-pay

## 2023-03-08 ENCOUNTER — Encounter (HOSPITAL_COMMUNITY): Payer: Self-pay

## 2023-03-08 ENCOUNTER — Inpatient Hospital Stay (HOSPITAL_COMMUNITY)
Admission: RE | Admit: 2023-03-08 | Discharge: 2023-03-16 | DRG: 464 | Disposition: A | Discharge status: Skilled Nursing Facility | Payer: Medicare Other | Attending: Orthopedic Surgery | Admitting: Orthopedic Surgery

## 2023-03-08 DIAGNOSIS — R5381 Other malaise: Secondary | ICD-10-CM | POA: Diagnosis present

## 2023-03-08 DIAGNOSIS — F32A Depression, unspecified: Secondary | ICD-10-CM | POA: Diagnosis present

## 2023-03-08 DIAGNOSIS — J449 Chronic obstructive pulmonary disease, unspecified: Secondary | ICD-10-CM | POA: Diagnosis present

## 2023-03-08 DIAGNOSIS — K219 Gastro-esophageal reflux disease without esophagitis: Secondary | ICD-10-CM | POA: Diagnosis present

## 2023-03-08 DIAGNOSIS — Z7901 Long term (current) use of anticoagulants: Secondary | ICD-10-CM | POA: Diagnosis not present

## 2023-03-08 DIAGNOSIS — I2510 Atherosclerotic heart disease of native coronary artery without angina pectoris: Secondary | ICD-10-CM | POA: Diagnosis present

## 2023-03-08 DIAGNOSIS — Y831 Surgical operation with implant of artificial internal device as the cause of abnormal reaction of the patient, or of later complication, without mention of misadventure at the time of the procedure: Secondary | ICD-10-CM | POA: Diagnosis present

## 2023-03-08 DIAGNOSIS — F419 Anxiety disorder, unspecified: Secondary | ICD-10-CM | POA: Diagnosis present

## 2023-03-08 DIAGNOSIS — D62 Acute posthemorrhagic anemia: Secondary | ICD-10-CM | POA: Diagnosis not present

## 2023-03-08 DIAGNOSIS — Z9181 History of falling: Secondary | ICD-10-CM

## 2023-03-08 DIAGNOSIS — G4730 Sleep apnea, unspecified: Secondary | ICD-10-CM | POA: Diagnosis present

## 2023-03-08 DIAGNOSIS — Z79899 Other long term (current) drug therapy: Secondary | ICD-10-CM

## 2023-03-08 DIAGNOSIS — Z87442 Personal history of urinary calculi: Secondary | ICD-10-CM

## 2023-03-08 DIAGNOSIS — Z7982 Long term (current) use of aspirin: Secondary | ICD-10-CM | POA: Diagnosis not present

## 2023-03-08 DIAGNOSIS — Z90710 Acquired absence of both cervix and uterus: Secondary | ICD-10-CM

## 2023-03-08 DIAGNOSIS — M797 Fibromyalgia: Secondary | ICD-10-CM | POA: Diagnosis present

## 2023-03-08 DIAGNOSIS — Z86711 Personal history of pulmonary embolism: Secondary | ICD-10-CM

## 2023-03-08 DIAGNOSIS — T8454XA Infection and inflammatory reaction due to internal left knee prosthesis, initial encounter: Secondary | ICD-10-CM | POA: Diagnosis present

## 2023-03-08 DIAGNOSIS — Z6841 Body Mass Index (BMI) 40.0 and over, adult: Secondary | ICD-10-CM

## 2023-03-08 DIAGNOSIS — B9561 Methicillin susceptible Staphylococcus aureus infection as the cause of diseases classified elsewhere: Secondary | ICD-10-CM | POA: Diagnosis present

## 2023-03-08 DIAGNOSIS — M1990 Unspecified osteoarthritis, unspecified site: Secondary | ICD-10-CM | POA: Diagnosis present

## 2023-03-08 DIAGNOSIS — T8131XA Disruption of external operation (surgical) wound, not elsewhere classified, initial encounter: Principal | ICD-10-CM | POA: Diagnosis present

## 2023-03-08 DIAGNOSIS — Z888 Allergy status to other drugs, medicaments and biological substances status: Secondary | ICD-10-CM | POA: Diagnosis not present

## 2023-03-08 DIAGNOSIS — M00062 Staphylococcal arthritis, left knee: Secondary | ICD-10-CM | POA: Diagnosis present

## 2023-03-08 DIAGNOSIS — Z87891 Personal history of nicotine dependence: Secondary | ICD-10-CM

## 2023-03-08 DIAGNOSIS — T8454XD Infection and inflammatory reaction due to internal left knee prosthesis, subsequent encounter: Secondary | ICD-10-CM | POA: Diagnosis not present

## 2023-03-08 DIAGNOSIS — T8459XA Infection and inflammatory reaction due to other internal joint prosthesis, initial encounter: Secondary | ICD-10-CM

## 2023-03-08 DIAGNOSIS — Z8249 Family history of ischemic heart disease and other diseases of the circulatory system: Secondary | ICD-10-CM | POA: Diagnosis not present

## 2023-03-08 DIAGNOSIS — Z96659 Presence of unspecified artificial knee joint: Secondary | ICD-10-CM

## 2023-03-08 DIAGNOSIS — F418 Other specified anxiety disorders: Secondary | ICD-10-CM | POA: Diagnosis not present

## 2023-03-08 LAB — COMPREHENSIVE METABOLIC PANEL
ALT: 14 U/L (ref 0–44)
AST: 27 U/L (ref 15–41)
Albumin: 2.6 g/dL — ABNORMAL LOW (ref 3.5–5.0)
Alkaline Phosphatase: 58 U/L (ref 38–126)
Anion gap: 15 (ref 5–15)
BUN: 12 mg/dL (ref 8–23)
CO2: 22 mmol/L (ref 22–32)
Calcium: 8.7 mg/dL — ABNORMAL LOW (ref 8.9–10.3)
Chloride: 102 mmol/L (ref 98–111)
Creatinine, Ser: 0.67 mg/dL (ref 0.44–1.00)
GFR, Estimated: >60 mL/min (ref >60)
Glucose, Bld: 95 mg/dL (ref 70–99)
Potassium: 3.9 mmol/L (ref 3.5–5.1)
Sodium: 139 mmol/L (ref 135–145)
Total Bilirubin: 0.7 mg/dL (ref 0.3–1.2)
Total Protein: 7.3 g/dL (ref 6.5–8.1)

## 2023-03-08 LAB — CBC
HCT: 36.5 % (ref 36.0–46.0)
Hemoglobin: 11.4 g/dL — ABNORMAL LOW (ref 12.0–15.0)
MCH: 29.8 pg (ref 26.0–34.0)
MCHC: 31.2 g/dL (ref 30.0–36.0)
MCV: 95.5 fL (ref 80.0–100.0)
Platelets: 208 10*3/uL (ref 150–400)
RBC: 3.82 MIL/uL — ABNORMAL LOW (ref 3.87–5.11)
RDW: 14.6 % (ref 11.5–15.5)
WBC: 9.3 10*3/uL (ref 4.0–10.5)
nRBC: 0.2 % (ref 0.0–0.2)

## 2023-03-08 MED ORDER — SODIUM CHLORIDE 0.9 % IV SOLN: INTRAVENOUS | Status: DC

## 2023-03-08 MED ORDER — METHOCARBAMOL 1000 MG/10ML IJ SOLN: 500.0000 mg | Freq: Four times a day (QID) | INTRAVENOUS | Status: DC | PRN

## 2023-03-08 MED ORDER — ENOXAPARIN SODIUM 30 MG/0.3ML IJ SOSY
30.0000 mg | PREFILLED_SYRINGE | Freq: Two times a day (BID) | INTRAMUSCULAR | Status: DC
  Administered 2023-03-08: 30 mg via SUBCUTANEOUS
  Filled 2023-03-08: qty 0.3

## 2023-03-08 MED ORDER — ONDANSETRON HCL 4 MG PO TABS: 4.0000 mg | ORAL_TABLET | Freq: Four times a day (QID) | ORAL | Status: DC | PRN

## 2023-03-08 MED ORDER — HYDROMORPHONE HCL 1 MG/ML IJ SOLN
0.5000 mg | INTRAMUSCULAR | Status: DC | PRN
  Administered 2023-03-08: 0.5 mg via INTRAVENOUS
  Administered 2023-03-08 – 2023-03-14 (×15): 1 mg via INTRAVENOUS
  Filled 2023-03-08 (×17): qty 1

## 2023-03-08 MED ORDER — POLYETHYLENE GLYCOL 3350 17 G PO PACK: 17.0000 g | PACK | Freq: Every day | ORAL | Status: DC | PRN

## 2023-03-08 MED ORDER — OXYCODONE HCL 5 MG PO TABS
5.0000 mg | ORAL_TABLET | ORAL | Status: DC | PRN
  Administered 2023-03-09: 10 mg via ORAL
  Administered 2023-03-10: 5 mg via ORAL
  Administered 2023-03-12 – 2023-03-14 (×4): 10 mg via ORAL
  Filled 2023-03-08: qty 1
  Filled 2023-03-08 (×3): qty 2

## 2023-03-08 MED ORDER — CEFAZOLIN SODIUM-DEXTROSE 2-4 GM/100ML-% IV SOLN
2.0000 g | Freq: Three times a day (TID) | INTRAVENOUS | Status: DC
  Administered 2023-03-08 – 2023-03-16 (×24): 2 g via INTRAVENOUS
  Filled 2023-03-08 (×24): qty 100

## 2023-03-08 MED ORDER — OXYCODONE HCL 5 MG PO TABS
10.0000 mg | ORAL_TABLET | ORAL | Status: DC | PRN
  Administered 2023-03-08 – 2023-03-09 (×4): 10 mg via ORAL
  Administered 2023-03-10 – 2023-03-12 (×6): 15 mg via ORAL
  Administered 2023-03-13: 10 mg via ORAL
  Administered 2023-03-14 – 2023-03-15 (×4): 15 mg via ORAL
  Filled 2023-03-08: qty 3
  Filled 2023-03-08: qty 2
  Filled 2023-03-08: qty 3
  Filled 2023-03-08: qty 2
  Filled 2023-03-08 (×5): qty 3
  Filled 2023-03-08: qty 2
  Filled 2023-03-08: qty 3
  Filled 2023-03-08 (×2): qty 2
  Filled 2023-03-08: qty 3
  Filled 2023-03-08: qty 2
  Filled 2023-03-08 (×3): qty 3

## 2023-03-08 MED ORDER — SENNA 8.6 MG PO TABS
1.0000 | ORAL_TABLET | Freq: Two times a day (BID) | ORAL | Status: DC
  Administered 2023-03-08 – 2023-03-16 (×13): 8.6 mg via ORAL
  Filled 2023-03-08 (×15): qty 1

## 2023-03-08 MED ORDER — DIPHENHYDRAMINE HCL 12.5 MG/5ML PO ELIX: 12.5000 mg | ORAL_SOLUTION | ORAL | Status: DC | PRN

## 2023-03-08 MED ORDER — METOCLOPRAMIDE HCL 5 MG PO TABS: 5.0000 mg | ORAL_TABLET | Freq: Three times a day (TID) | ORAL | Status: DC | PRN

## 2023-03-08 MED ORDER — METHOCARBAMOL 500 MG PO TABS
500.0000 mg | ORAL_TABLET | Freq: Four times a day (QID) | ORAL | Status: DC | PRN
  Administered 2023-03-09 – 2023-03-16 (×13): 500 mg via ORAL
  Filled 2023-03-08 (×15): qty 1

## 2023-03-08 MED ORDER — ACETAMINOPHEN 325 MG PO TABS
325.0000 mg | ORAL_TABLET | Freq: Four times a day (QID) | ORAL | Status: DC | PRN
  Administered 2023-03-09: 650 mg via ORAL
  Filled 2023-03-08 (×2): qty 2

## 2023-03-08 MED ORDER — ONDANSETRON HCL 4 MG/2ML IJ SOLN
4.0000 mg | Freq: Four times a day (QID) | INTRAMUSCULAR | Status: DC | PRN
  Administered 2023-03-09: 4 mg via INTRAVENOUS
  Filled 2023-03-08: qty 2

## 2023-03-08 MED ORDER — ACETAMINOPHEN 500 MG PO TABS
1000.0000 mg | ORAL_TABLET | Freq: Four times a day (QID) | ORAL | Status: AC
  Administered 2023-03-08 – 2023-03-09 (×4): 1000 mg via ORAL
  Filled 2023-03-08 (×4): qty 2

## 2023-03-08 MED ORDER — DOCUSATE SODIUM 100 MG PO CAPS
100.0000 mg | ORAL_CAPSULE | Freq: Two times a day (BID) | ORAL | Status: DC
  Administered 2023-03-08 – 2023-03-16 (×13): 100 mg via ORAL
  Filled 2023-03-08 (×15): qty 1

## 2023-03-08 MED ORDER — METOCLOPRAMIDE HCL 5 MG/ML IJ SOLN: 5.0000 mg | Freq: Three times a day (TID) | INTRAMUSCULAR | Status: DC | PRN

## 2023-03-08 NOTE — Progress Notes (Signed)
Pharmacy Antibiotic Note  Norma Anthony is a 67 y.o. female s/p revision repair of left medial parapatellar arthrotomy with augment on 02/03/23 who presented to [facility] on 03/08/2023 with left knee infection.  Pharmacy has been consulted to dose ancef.  Plan: - ancef 2 gm IV q8h - will f/u with renal function and adjust dose if needed  _________________________________________  Temp (24hrs), Avg:99.9 F (37.7 C), Min:99.9 F (37.7 C), Max:99.9 F (37.7 C)  No results for input(s): "WBC", "CREATININE", "LATICACIDVEN", "VANCOTROUGH", "VANCOPEAK", "VANCORANDOM", "GENTTROUGH", "GENTPEAK", "GENTRANDOM", "TOBRATROUGH", "TOBRAPEAK", "TOBRARND", "AMIKACINPEAK", "AMIKACINTROU", "AMIKACIN" in the last 168 hours.  CrCl cannot be calculated (Patient's most recent lab result is older than the maximum 21 days allowed.).    Allergies  Allergen Reactions   Lyrica [Pregabalin] Other (See Comments)    Paralysis present (finding)- not tolerated Pt stated she could not move her body but she could think      Belbuca [Buprenorphine Hcl] Other (See Comments)    " Horrible headache"    Compazine [Prochlorperazine] Anxiety   Droperidol Anxiety   Ergotamine-Caffeine Anxiety and Hypertension    Increased systolic arterial pressure (finding)     Thank you for allowing pharmacy to be a part of this patient's care.  Lynelle Doctor 03/08/2023 1:39 PM

## 2023-03-09 ENCOUNTER — Ambulatory Visit: Payer: Self-pay | Admitting: Student

## 2023-03-09 DIAGNOSIS — T8131XA Disruption of external operation (surgical) wound, not elsewhere classified, initial encounter: Secondary | ICD-10-CM

## 2023-03-09 MED ORDER — ALBUTEROL SULFATE (2.5 MG/3ML) 0.083% IN NEBU: 2.5000 mg | INHALATION_SOLUTION | Freq: Four times a day (QID) | RESPIRATORY_TRACT | Status: DC | PRN

## 2023-03-09 MED ORDER — ENOXAPARIN SODIUM 30 MG/0.3ML IJ SOSY
30.0000 mg | PREFILLED_SYRINGE | Freq: Once | INTRAMUSCULAR | Status: AC
  Administered 2023-03-09: 30 mg via SUBCUTANEOUS
  Filled 2023-03-09: qty 0.3

## 2023-03-09 MED ORDER — PHENOL 1.4 % MT LIQD: 1.0000 | OROMUCOSAL | Status: DC | PRN

## 2023-03-09 MED ORDER — NALOXONE HCL 4 MG/0.1ML NA LIQD: 1.0000 | Freq: Once | NASAL | Status: DC | PRN

## 2023-03-09 MED ORDER — ALPRAZOLAM 0.5 MG PO TABS
1.0000 mg | ORAL_TABLET | Freq: Four times a day (QID) | ORAL | Status: DC | PRN
  Administered 2023-03-09 – 2023-03-16 (×4): 1 mg via ORAL
  Filled 2023-03-09 (×5): qty 2

## 2023-03-09 MED ORDER — DULOXETINE HCL 60 MG PO CPEP
60.0000 mg | ORAL_CAPSULE | Freq: Every day | ORAL | Status: DC
  Administered 2023-03-09 – 2023-03-15 (×7): 60 mg via ORAL
  Filled 2023-03-09 (×7): qty 1

## 2023-03-09 MED ORDER — ALBUTEROL SULFATE HFA 108 (90 BASE) MCG/ACT IN AERS: 2.0000 | INHALATION_SPRAY | Freq: Four times a day (QID) | RESPIRATORY_TRACT | Status: DC | PRN

## 2023-03-09 MED ORDER — GABAPENTIN 400 MG PO CAPS
400.0000 mg | ORAL_CAPSULE | Freq: Three times a day (TID) | ORAL | Status: DC
  Administered 2023-03-09 – 2023-03-16 (×22): 400 mg via ORAL
  Filled 2023-03-09 (×22): qty 1

## 2023-03-09 MED ORDER — ENOXAPARIN SODIUM 30 MG/0.3ML IJ SOSY: 30.0000 mg | PREFILLED_SYRINGE | Freq: Two times a day (BID) | INTRAMUSCULAR | Status: DC

## 2023-03-09 MED ORDER — MELATONIN 5 MG PO TABS
10.0000 mg | ORAL_TABLET | Freq: Every day | ORAL | Status: DC
  Administered 2023-03-09 – 2023-03-15 (×7): 10 mg via ORAL
  Filled 2023-03-09 (×7): qty 2

## 2023-03-09 NOTE — Progress Notes (Signed)
Patient stated that per her primary MD she was diagnosed with a UTI on Friday, March 05, 2023. The patient steted that her primary MD ordered Cipro x5 days and the patient can't remember if she took the medication. Patient requested to page [facility] and request a UA.

## 2023-03-09 NOTE — Progress Notes (Signed)
Notified about patient's request for UTI treatment. Unable to locate outside record indicating dx and current cipro use. Aware patient has elevated fever, but cannot distinguish if from UTI vs underlying knee infection. Will order UA with plan for Dr. Sid Falcon team to read in morning and determine treatment given surgery scheduled for tomorrow too. Encouraged to push fluids until NPO status at midnight.

## 2023-03-09 NOTE — Progress Notes (Signed)
    Subjective:  Patient reports pain as moderate.  Denies N/V/CP/SOB/Abd pain. Patient feels less feverish today. She states she is having significant pain still. Patient anxious about surgery and next steps. Reassurance provided. Denies any tingling or numbness in LE bilaterally.   Objective:   VITALS:   Vitals:   03/08/23 1219 03/08/23 1346 03/08/23 2116 03/09/23 0628  BP: (!) 146/64  115/80 128/62  Pulse: 92  84 85  Resp: 18  17 17   Temp: 99.9 F (37.7 C)  98.6 F (37 C) 98.1 F (36.7 C)  TempSrc: Oral  Oral Oral  SpO2: 95%  93% 96%  Weight:  110.2 kg    Height:  5\' 5"  (1.651 m)      Patient lying in bed. NAD.  Neurologically intact ABD soft Neurovascular intact Sensation intact distally Intact pulses distally Dorsiflexion/Plantar flexion intact Compartment soft Incision:  purulent drainage and erythema from 3 spots along incision. Bulky dressings in place, continue to reinforce.   Lab Results  Component Value Date   WBC 9.3 03/08/2023   HGB 11.4 (L) 03/08/2023   HCT 36.5 03/08/2023   MCV 95.5 03/08/2023   PLT 208 03/08/2023   BMET    Component Value Date/Time   NA 139 03/08/2023 1455   K 3.9 03/08/2023 1455   CL 102 03/08/2023 1455   CO2 22 03/08/2023 1455   GLUCOSE 95 03/08/2023 1455   BUN 12 03/08/2023 1455   CREATININE 0.67 03/08/2023 1455   CALCIUM 8.7 (L) 03/08/2023 1455   GFRNONAA >60 03/08/2023 1455     Assessment/Plan:     Principal Problem:   Surgical wound dehiscence   WBAT with walker and knee brace.  DVT ppx: Lovenox hold PM dosing today and then tomorrow for scheduled surgery., SCDs, TEDS PO pain control PT/OT Dispo: - Surgery scheduled for tomorrow. Patient NPO at midnight tonight.  - Hold today's PM lovenox dosing and tomorrow for scheduled surgery.  - Continue pain control - Continue IV ancef - Knee brace with ambulation   Charlott Rakes, PA-C 03/09/2023, 8:08 AM   [facility]  [place] [address], [address], [place], [place] [address] Phone: [phone] [email or web address]  [employer]

## 2023-03-10 ENCOUNTER — Inpatient Hospital Stay (HOSPITAL_COMMUNITY): Payer: Medicare Other | Admitting: Anesthesiology

## 2023-03-10 ENCOUNTER — Other Ambulatory Visit: Payer: Self-pay

## 2023-03-10 ENCOUNTER — Encounter (HOSPITAL_COMMUNITY): Payer: Self-pay | Admitting: Orthopedic Surgery

## 2023-03-10 ENCOUNTER — Inpatient Hospital Stay (HOSPITAL_COMMUNITY): Payer: Medicare Other

## 2023-03-10 ENCOUNTER — Encounter (HOSPITAL_COMMUNITY)
Admission: RE | Disposition: A | Discharge status: Skilled Nursing Facility | Payer: Self-pay | Source: Home / Self Care | Attending: Orthopedic Surgery

## 2023-03-10 DIAGNOSIS — J449 Chronic obstructive pulmonary disease, unspecified: Secondary | ICD-10-CM | POA: Diagnosis not present

## 2023-03-10 DIAGNOSIS — G4730 Sleep apnea, unspecified: Secondary | ICD-10-CM

## 2023-03-10 DIAGNOSIS — T8454XA Infection and inflammatory reaction due to internal left knee prosthesis, initial encounter: Secondary | ICD-10-CM | POA: Diagnosis not present

## 2023-03-10 DIAGNOSIS — F418 Other specified anxiety disorders: Secondary | ICD-10-CM

## 2023-03-10 HISTORY — PX: EXCISIONAL TOTAL KNEE ARTHROPLASTY WITH ANTIBIOTIC SPACERS: SHX5827

## 2023-03-10 LAB — PROTIME-INR
INR: 1.3 — ABNORMAL HIGH (ref 0.8–1.2)
Prothrombin Time: 15.6 seconds — ABNORMAL HIGH (ref 11.4–15.2)

## 2023-03-10 LAB — URINALYSIS, ROUTINE W REFLEX MICROSCOPIC
Bilirubin Urine: NEGATIVE
Glucose, UA: NEGATIVE mg/dL
Hgb urine dipstick: NEGATIVE
Ketones, ur: NEGATIVE mg/dL
Leukocytes,Ua: NEGATIVE
Nitrite: NEGATIVE
Protein, ur: NEGATIVE mg/dL
Specific Gravity, Urine: 1.012 (ref 1.005–1.030)
pH: 5 (ref 5.0–8.0)

## 2023-03-10 LAB — COMPREHENSIVE METABOLIC PANEL
ALT: 12 U/L (ref 0–44)
AST: 19 U/L (ref 15–41)
Albumin: 2.2 g/dL — ABNORMAL LOW (ref 3.5–5.0)
Alkaline Phosphatase: 47 U/L (ref 38–126)
Anion gap: 9 (ref 5–15)
BUN: 7 mg/dL — ABNORMAL LOW (ref 8–23)
CO2: 26 mmol/L (ref 22–32)
Calcium: 8.6 mg/dL — ABNORMAL LOW (ref 8.9–10.3)
Chloride: 107 mmol/L (ref 98–111)
Creatinine, Ser: 0.64 mg/dL (ref 0.44–1.00)
GFR, Estimated: >60 mL/min (ref >60)
Glucose, Bld: 92 mg/dL (ref 70–99)
Potassium: 3.4 mmol/L — ABNORMAL LOW (ref 3.5–5.1)
Sodium: 142 mmol/L (ref 135–145)
Total Bilirubin: 0.4 mg/dL (ref 0.3–1.2)
Total Protein: 6.2 g/dL — ABNORMAL LOW (ref 6.5–8.1)

## 2023-03-10 LAB — CBC
HCT: 29.2 % — ABNORMAL LOW (ref 36.0–46.0)
Hemoglobin: 8.8 g/dL — ABNORMAL LOW (ref 12.0–15.0)
MCH: 29.8 pg (ref 26.0–34.0)
MCHC: 30.1 g/dL (ref 30.0–36.0)
MCV: 99 fL (ref 80.0–100.0)
Platelets: 328 10*3/uL (ref 150–400)
RBC: 2.95 MIL/uL — ABNORMAL LOW (ref 3.87–5.11)
RDW: 14.7 % (ref 11.5–15.5)
WBC: 8.8 10*3/uL (ref 4.0–10.5)
nRBC: 0.5 % — ABNORMAL HIGH (ref 0.0–0.2)

## 2023-03-10 LAB — SEDIMENTATION RATE: Sed Rate: 135 mm/hr — ABNORMAL HIGH (ref 0–22)

## 2023-03-10 LAB — C-REACTIVE PROTEIN: CRP: 11.6 mg/dL — ABNORMAL HIGH (ref <1.0)

## 2023-03-10 LAB — SURGICAL PCR SCREEN
MRSA, PCR: NEGATIVE
Staphylococcus aureus: NEGATIVE

## 2023-03-10 LAB — ABO/RH: ABO/RH(D): A POS

## 2023-03-10 LAB — PREPARE RBC (CROSSMATCH)

## 2023-03-10 SURGERY — REMOVAL, TOTAL ARTHROPLASTY HARDWARE, KNEE, WITH ANTIBIOTIC SPACER INSERTION
Anesthesia: General | Site: Knee | Laterality: Left

## 2023-03-10 MED ORDER — LACTATED RINGERS IV SOLN: INTRAVENOUS | Status: DC

## 2023-03-10 MED ORDER — METOCLOPRAMIDE HCL 5 MG/ML IJ SOLN: 5.0000 mg | Freq: Three times a day (TID) | INTRAMUSCULAR | Status: DC | PRN

## 2023-03-10 MED ORDER — VANCOMYCIN HCL 1500 MG/300ML IV SOLN
1500.0000 mg | INTRAVENOUS | Status: AC
  Administered 2023-03-10: 1500 mg via INTRAVENOUS
  Filled 2023-03-10: qty 300

## 2023-03-10 MED ORDER — HYDROMORPHONE HCL 2 MG/ML IJ SOLN
INTRAMUSCULAR | Status: AC
  Filled 2023-03-10: qty 1

## 2023-03-10 MED ORDER — DEXAMETHASONE SODIUM PHOSPHATE 10 MG/ML IJ SOLN
INTRAMUSCULAR | Status: AC
  Filled 2023-03-10: qty 1

## 2023-03-10 MED ORDER — ONDANSETRON HCL 4 MG PO TABS
4.0000 mg | ORAL_TABLET | Freq: Four times a day (QID) | ORAL | Status: DC | PRN
  Administered 2023-03-16: 4 mg via ORAL
  Filled 2023-03-10: qty 1

## 2023-03-10 MED ORDER — LACTATED RINGERS IV SOLN: INTRAVENOUS | Status: DC | PRN

## 2023-03-10 MED ORDER — PHENOL 1.4 % MT LIQD: 1.0000 | OROMUCOSAL | Status: DC | PRN

## 2023-03-10 MED ORDER — BUPIVACAINE HCL 0.25 % IJ SOLN
INTRAMUSCULAR | Status: AC
  Filled 2023-03-10: qty 1

## 2023-03-10 MED ORDER — DIPHENHYDRAMINE HCL 12.5 MG/5ML PO ELIX: 12.5000 mg | ORAL_SOLUTION | ORAL | Status: DC | PRN

## 2023-03-10 MED ORDER — STERILE WATER FOR IRRIGATION IR SOLN
Status: DC | PRN
  Administered 2023-03-10: 2000 mL

## 2023-03-10 MED ORDER — CEFAZOLIN SODIUM-DEXTROSE 2-4 GM/100ML-% IV SOLN: 2.0000 g | Freq: Four times a day (QID) | INTRAVENOUS | Status: DC

## 2023-03-10 MED ORDER — TRANEXAMIC ACID-NACL 1000-0.7 MG/100ML-% IV SOLN
1000.0000 mg | INTRAVENOUS | Status: AC
  Administered 2023-03-10: 1000 mg via INTRAVENOUS
  Filled 2023-03-10: qty 100

## 2023-03-10 MED ORDER — HYDROMORPHONE HCL 1 MG/ML IJ SOLN
INTRAMUSCULAR | Status: DC | PRN
  Administered 2023-03-10 (×2): 1 mg via INTRAVENOUS

## 2023-03-10 MED ORDER — EPINEPHRINE PF 1 MG/ML IJ SOLN
INTRAMUSCULAR | Status: AC
  Filled 2023-03-10: qty 1

## 2023-03-10 MED ORDER — SODIUM CHLORIDE 0.9 % IV SOLN: 10.0000 mL/h | Freq: Once | INTRAVENOUS | Status: DC

## 2023-03-10 MED ORDER — TOBRAMYCIN SULFATE 1.2 G IJ SOLR
INTRAMUSCULAR | Status: AC
  Filled 2023-03-10: qty 6

## 2023-03-10 MED ORDER — ACETAMINOPHEN 500 MG PO TABS
1000.0000 mg | ORAL_TABLET | Freq: Once | ORAL | Status: AC
  Administered 2023-03-10: 1000 mg via ORAL
  Filled 2023-03-10: qty 2

## 2023-03-10 MED ORDER — TOBRAMYCIN SULFATE 1.2 G IJ SOLR
INTRAMUSCULAR | Status: DC | PRN
  Administered 2023-03-10: 12

## 2023-03-10 MED ORDER — LABETALOL HCL 5 MG/ML IV SOLN
INTRAVENOUS | Status: DC | PRN
  Administered 2023-03-10: 10 mg via INTRAVENOUS

## 2023-03-10 MED ORDER — ONDANSETRON HCL 4 MG/2ML IJ SOLN
INTRAMUSCULAR | Status: DC | PRN
  Administered 2023-03-10: 4 mg via INTRAVENOUS

## 2023-03-10 MED ORDER — ISOPROPYL ALCOHOL 70 % SOLN
Status: DC | PRN
  Administered 2023-03-10: 1 via TOPICAL

## 2023-03-10 MED ORDER — METOCLOPRAMIDE HCL 5 MG PO TABS: 5.0000 mg | ORAL_TABLET | Freq: Three times a day (TID) | ORAL | Status: DC | PRN

## 2023-03-10 MED ORDER — MIDAZOLAM HCL 2 MG/2ML IJ SOLN: 0.5000 mg | Freq: Once | INTRAMUSCULAR | Status: DC | PRN

## 2023-03-10 MED ORDER — SODIUM CHLORIDE (PF) 0.9 % IJ SOLN
INTRAMUSCULAR | Status: AC
  Filled 2023-03-10: qty 30

## 2023-03-10 MED ORDER — OXYCODONE HCL 5 MG/5ML PO SOLN: 5.0000 mg | Freq: Once | ORAL | Status: AC | PRN

## 2023-03-10 MED ORDER — SODIUM CHLORIDE 0.9 % IV SOLN: INTRAVENOUS | Status: DC | PRN

## 2023-03-10 MED ORDER — MEPERIDINE HCL 50 MG/ML IJ SOLN: 6.2500 mg | INTRAMUSCULAR | Status: DC | PRN

## 2023-03-10 MED ORDER — PROMETHAZINE HCL 25 MG/ML IJ SOLN: 6.2500 mg | INTRAMUSCULAR | Status: DC | PRN

## 2023-03-10 MED ORDER — DOCUSATE SODIUM 100 MG PO CAPS: 100.0000 mg | ORAL_CAPSULE | Freq: Two times a day (BID) | ORAL | Status: DC

## 2023-03-10 MED ORDER — MIDAZOLAM HCL 2 MG/2ML IJ SOLN
INTRAMUSCULAR | Status: AC
  Filled 2023-03-10: qty 2

## 2023-03-10 MED ORDER — HYDROMORPHONE HCL 1 MG/ML IJ SOLN
0.2500 mg | INTRAMUSCULAR | Status: DC | PRN
  Administered 2023-03-10: 0.5 mg via INTRAVENOUS

## 2023-03-10 MED ORDER — POVIDONE-IODINE 10 % EX SWAB
2.0000 | Freq: Once | CUTANEOUS | Status: AC
  Administered 2023-03-10: 2 via TOPICAL

## 2023-03-10 MED ORDER — SODIUM CHLORIDE 0.9 % IR SOLN
Status: DC | PRN
  Administered 2023-03-10: 1000 mL

## 2023-03-10 MED ORDER — FENTANYL CITRATE (PF) 250 MCG/5ML IJ SOLN
INTRAMUSCULAR | Status: AC
  Filled 2023-03-10: qty 5

## 2023-03-10 MED ORDER — LIDOCAINE HCL (PF) 2 % IJ SOLN
INTRAMUSCULAR | Status: AC
  Filled 2023-03-10: qty 5

## 2023-03-10 MED ORDER — ONDANSETRON HCL 4 MG/2ML IJ SOLN
INTRAMUSCULAR | Status: AC
  Filled 2023-03-10: qty 2

## 2023-03-10 MED ORDER — CEFAZOLIN SODIUM-DEXTROSE 2-4 GM/100ML-% IV SOLN
2.0000 g | INTRAVENOUS | Status: AC
  Administered 2023-03-10: 2 g via INTRAVENOUS
  Filled 2023-03-10: qty 100

## 2023-03-10 MED ORDER — SUGAMMADEX SODIUM 500 MG/5ML IV SOLN
INTRAVENOUS | Status: DC | PRN
  Administered 2023-03-10: 200 mg via INTRAVENOUS

## 2023-03-10 MED ORDER — TOBRAMYCIN SULFATE 1.2 G IJ SOLR
INTRAMUSCULAR | Status: AC
  Filled 2023-03-10: qty 7.2

## 2023-03-10 MED ORDER — RIVAROXABAN 10 MG PO TABS
10.0000 mg | ORAL_TABLET | Freq: Every day | ORAL | Status: DC
  Administered 2023-03-11 – 2023-03-16 (×6): 10 mg via ORAL
  Filled 2023-03-10 (×6): qty 1

## 2023-03-10 MED ORDER — ONDANSETRON HCL 4 MG/2ML IJ SOLN: 4.0000 mg | Freq: Four times a day (QID) | INTRAMUSCULAR | Status: DC | PRN

## 2023-03-10 MED ORDER — OXYCODONE HCL 5 MG PO TABS
ORAL_TABLET | ORAL | Status: AC
  Filled 2023-03-10: qty 1

## 2023-03-10 MED ORDER — ALUM & MAG HYDROXIDE-SIMETH 200-200-20 MG/5ML PO SUSP: 30.0000 mL | ORAL | Status: DC | PRN

## 2023-03-10 MED ORDER — PROPOFOL 10 MG/ML IV BOLUS
INTRAVENOUS | Status: DC | PRN
  Administered 2023-03-10: 120 mg via INTRAVENOUS

## 2023-03-10 MED ORDER — LIDOCAINE HCL (CARDIAC) PF 100 MG/5ML IV SOSY
PREFILLED_SYRINGE | INTRAVENOUS | Status: DC | PRN
  Administered 2023-03-10: 25 mg via INTRAVENOUS

## 2023-03-10 MED ORDER — ROCURONIUM BROMIDE 100 MG/10ML IV SOLN
INTRAVENOUS | Status: DC | PRN
  Administered 2023-03-10: 60 mg via INTRAVENOUS
  Administered 2023-03-10: 40 mg via INTRAVENOUS

## 2023-03-10 MED ORDER — SODIUM CHLORIDE 0.9 % IR SOLN
Status: DC | PRN
  Administered 2023-03-10 (×2): 3000 mL

## 2023-03-10 MED ORDER — OXYCODONE HCL 5 MG PO TABS
5.0000 mg | ORAL_TABLET | Freq: Once | ORAL | Status: AC | PRN
  Administered 2023-03-10: 5 mg via ORAL

## 2023-03-10 MED ORDER — FENTANYL CITRATE (PF) 100 MCG/2ML IJ SOLN
INTRAMUSCULAR | Status: DC | PRN
  Administered 2023-03-10: 50 ug via INTRAVENOUS
  Administered 2023-03-10 (×2): 100 ug via INTRAVENOUS

## 2023-03-10 MED ORDER — METHOCARBAMOL 500 MG IVPB - SIMPLE MED: 500.0000 mg | Freq: Four times a day (QID) | INTRAVENOUS | Status: DC | PRN

## 2023-03-10 MED ORDER — KETAMINE HCL 10 MG/ML IJ SOLN
INTRAMUSCULAR | Status: DC | PRN
  Administered 2023-03-10 (×2): 10 mg via INTRAVENOUS
  Administered 2023-03-10: 30 mg via INTRAVENOUS

## 2023-03-10 MED ORDER — 0.9 % SODIUM CHLORIDE (POUR BTL) OPTIME
TOPICAL | Status: DC | PRN
  Administered 2023-03-10: 1000 mL

## 2023-03-10 MED ORDER — KETAMINE HCL 50 MG/5ML IJ SOSY
PREFILLED_SYRINGE | INTRAMUSCULAR | Status: AC
  Filled 2023-03-10: qty 5

## 2023-03-10 MED ORDER — BUPIVACAINE HCL (PF) 0.5 % IJ SOLN
INTRAMUSCULAR | Status: AC
  Filled 2023-03-10: qty 30

## 2023-03-10 MED ORDER — ROCURONIUM BROMIDE 10 MG/ML (PF) SYRINGE
PREFILLED_SYRINGE | INTRAVENOUS | Status: AC
  Filled 2023-03-10: qty 10

## 2023-03-10 MED ORDER — KETOROLAC TROMETHAMINE 30 MG/ML IJ SOLN
INTRAMUSCULAR | Status: AC
  Filled 2023-03-10: qty 1

## 2023-03-10 MED ORDER — MENTHOL 3 MG MT LOZG: 1.0000 | LOZENGE | OROMUCOSAL | Status: DC | PRN

## 2023-03-10 MED ORDER — PROPOFOL 10 MG/ML IV BOLUS
INTRAVENOUS | Status: AC
  Filled 2023-03-10: qty 20

## 2023-03-10 MED ORDER — METHOCARBAMOL 500 MG PO TABS: 500.0000 mg | ORAL_TABLET | Freq: Four times a day (QID) | ORAL | Status: DC | PRN

## 2023-03-10 MED ORDER — MIDAZOLAM HCL 5 MG/5ML IJ SOLN
INTRAMUSCULAR | Status: DC | PRN
  Administered 2023-03-10: 2 mg via INTRAVENOUS

## 2023-03-10 MED ORDER — HYDROMORPHONE HCL 1 MG/ML IJ SOLN
INTRAMUSCULAR | Status: AC
  Administered 2023-03-10: 0.5 mg via INTRAVENOUS
  Filled 2023-03-10: qty 1

## 2023-03-10 MED ORDER — DEXAMETHASONE SODIUM PHOSPHATE 10 MG/ML IJ SOLN
INTRAMUSCULAR | Status: DC | PRN
  Administered 2023-03-10: 10 mg via INTRAVENOUS

## 2023-03-10 SURGICAL SUPPLY — 91 items
ADH SKN CLS APL DERMABOND .7 (GAUZE/BANDAGES/DRESSINGS) ×1
APL PRP STRL LF DISP 70% ISPRP (MISCELLANEOUS) ×2
BAG COUNTER SPONGE SURGICOUNT (BAG) ×2 IMPLANT
BAG SPEC THK2 15X12 ZIP CLS (MISCELLANEOUS) ×1
BAG SPNG CNTER NS LX DISP (BAG) ×1
BAG ZIPLOCK 12X15 (MISCELLANEOUS) ×2 IMPLANT
BLADE SAG 18X100X1.27 (BLADE) ×2 IMPLANT
BLADE SAW RECIPROCATING 77.5 (BLADE) IMPLANT
BLADE SAW SGTL 81X20 HD (BLADE) ×2 IMPLANT
BNDG CMPR 5X62 HK CLSR LF (GAUZE/BANDAGES/DRESSINGS) ×1
BNDG CMPR 6"X 5 YARDS HK CLSR (GAUZE/BANDAGES/DRESSINGS) ×1
BNDG CMPR 82X61 PLY HI ABS (GAUZE/BANDAGES/DRESSINGS) ×2
BNDG CMPR MED 10X6 ELC LF (GAUZE/BANDAGES/DRESSINGS) ×1
BNDG CONFORM 6X.82 1P STRL (GAUZE/BANDAGES/DRESSINGS) IMPLANT
BNDG ELASTIC 4X5.8 VLCR STR LF (GAUZE/BANDAGES/DRESSINGS) ×2 IMPLANT
BNDG ELASTIC 6INX 5YD STR LF (GAUZE/BANDAGES/DRESSINGS) ×2 IMPLANT
BNDG ELASTIC 6X10 VLCR STRL LF (GAUZE/BANDAGES/DRESSINGS) IMPLANT
BOWL SMART MIX CTS (DISPOSABLE) ×4 IMPLANT
BUR OVAL CARBIDE 4.0 (BURR) ×2 IMPLANT
CEMENT BONE R 1X40 (Cement) IMPLANT
CHLORAPREP W/TINT 26 (MISCELLANEOUS) ×4 IMPLANT
CNTNR URN SCR LID CUP LEK RST (MISCELLANEOUS) IMPLANT
CONT SPEC 4OZ STRL OR WHT (MISCELLANEOUS) ×1
COVER SURGICAL LIGHT HANDLE (MISCELLANEOUS) ×2 IMPLANT
CUFF TOURN SGL QUICK 34 (TOURNIQUET CUFF) ×1
CUFF TRNQT CYL 34X4.125X (TOURNIQUET CUFF) ×2 IMPLANT
DERMABOND ADVANCED .7 DNX12 (GAUZE/BANDAGES/DRESSINGS) ×2 IMPLANT
DRAPE INCISE IOBAN 66X45 STRL (DRAPES) ×2 IMPLANT
DRAPE SHEET LG 3/4 BI-LAMINATE (DRAPES) ×6 IMPLANT
DRAPE U-SHAPE 47X51 STRL (DRAPES) ×2 IMPLANT
DRSG AQUACEL AG ADV 3.5X10 (GAUZE/BANDAGES/DRESSINGS) ×2 IMPLANT
DRSG EMULSION OIL 3X16 NADH (GAUZE/BANDAGES/DRESSINGS) IMPLANT
DRSG TEGADERM 4X4.75 (GAUZE/BANDAGES/DRESSINGS) IMPLANT
DRSG VAC GRANUFOAM MED (GAUZE/BANDAGES/DRESSINGS) IMPLANT
DRSG VAC GRANUFOAM SM (GAUZE/BANDAGES/DRESSINGS) IMPLANT
ELECT BLADE TIP CTD 4 INCH (ELECTRODE) ×2 IMPLANT
ELECT REM PT RETURN 15FT ADLT (MISCELLANEOUS) ×2 IMPLANT
EVACUATOR 1/8 PVC DRAIN (DRAIN) IMPLANT
EVACUATOR DRAINAGE 10X20 100CC (DRAIN) IMPLANT
EVACUATOR SILICONE 100CC (DRAIN)
GAUZE SPONGE 2X2 8PLY STRL LF (GAUZE/BANDAGES/DRESSINGS) IMPLANT
GAUZE SPONGE 4X4 12PLY STRL (GAUZE/BANDAGES/DRESSINGS) ×2 IMPLANT
GLOVE BIO SURGEON STRL SZ8.5 (GLOVE) ×4 IMPLANT
GLOVE BIOGEL M 7.0 STRL (GLOVE) ×2 IMPLANT
GLOVE BIOGEL PI IND STRL 7.0 (GLOVE) ×2 IMPLANT
GLOVE BIOGEL PI IND STRL 7.5 (GLOVE) ×2 IMPLANT
GLOVE BIOGEL PI IND STRL 8.5 (GLOVE) ×2 IMPLANT
GOWN SPEC L3 XXLG W/TWL (GOWN DISPOSABLE) ×2 IMPLANT
GOWN SPEC L4 XLG W/TWL (GOWN DISPOSABLE) ×2 IMPLANT
HANDPIECE INTERPULSE COAX TIP (DISPOSABLE) ×1
HDLS TROCR DRIL PIN KNEE 75 (PIN) ×1
HOOD PEEL AWAY T7 (MISCELLANEOUS) ×6 IMPLANT
IMMOBILIZER KNEE 20 (SOFTGOODS) ×1
IMMOBILIZER KNEE 20 THIGH 36 (SOFTGOODS) IMPLANT
JET LAVAGE IRRISEPT WOUND (IRRIGATION / IRRIGATOR) ×1
KIT DRSG PREVENA PLUS 7DAY 125 (MISCELLANEOUS) IMPLANT
KIT TURNOVER KIT A (KITS) IMPLANT
LAVAGE JET IRRISEPT WOUND (IRRIGATION / IRRIGATOR) ×6 IMPLANT
MANIFOLD NEPTUNE II (INSTRUMENTS) ×2 IMPLANT
MARKER SKIN DUAL TIP RULER LAB (MISCELLANEOUS) ×2 IMPLANT
NDL SPNL 18GX3.5 QUINCKE PK (NEEDLE) ×2 IMPLANT
NEEDLE SPNL 18GX3.5 QUINCKE PK (NEEDLE) ×1 IMPLANT
NS IRRIG 1000ML POUR BTL (IV SOLUTION) ×4 IMPLANT
PACK TOTAL KNEE CUSTOM (KITS) ×2 IMPLANT
PADDING CAST COTTON 6X4 STRL (CAST SUPPLIES) ×2 IMPLANT
PIN DRILL HDLS TROCAR 75 4PK (PIN) IMPLANT
PROTECTOR NERVE ULNAR (MISCELLANEOUS) ×2 IMPLANT
SAW OSC TIP CART 19.5X105X1.3 (SAW) ×2 IMPLANT
SEALER BIPOLAR AQUA 6.0 (INSTRUMENTS) ×2 IMPLANT
SET HNDPC FAN SPRY TIP SCT (DISPOSABLE) ×2 IMPLANT
SET PAD KNEE POSITIONER (MISCELLANEOUS) ×2 IMPLANT
STAPLER VISISTAT 35W (STAPLE) IMPLANT
SUT ETHILON 2 0 PSLX (SUTURE) IMPLANT
SUT MNCRL AB 3-0 PS2 18 (SUTURE) ×2 IMPLANT
SUT MNCRL AB 4-0 PS2 18 (SUTURE) ×2 IMPLANT
SUT MON AB 2-0 CT1 36 (SUTURE) ×2 IMPLANT
SUT NYLON 3 0 (SUTURE) IMPLANT
SUT PDS AB 1 CTX 36 (SUTURE) IMPLANT
SUT STRATAFIX PDO 1 14 VIOLET (SUTURE) ×1
SUT STRATFX PDO 1 14 VIOLET (SUTURE) ×1
SUT VIC AB 1 CTX 36 (SUTURE) ×2
SUT VIC AB 1 CTX36XBRD ANBCTR (SUTURE) ×4 IMPLANT
SUT VIC AB 2-0 CT1 27 (SUTURE) ×1
SUT VIC AB 2-0 CT1 TAPERPNT 27 (SUTURE) ×2 IMPLANT
SUTURE STRATFX PDO 1 14 VIOLET (SUTURE) ×2 IMPLANT
SWAB COLLECTION DEVICE MRSA (MISCELLANEOUS) IMPLANT
SWAB CULTURE ESWAB REG 1ML (MISCELLANEOUS) IMPLANT
TRAY FOLEY MTR SLVR 16FR STAT (SET/KITS/TRAYS/PACK) ×2 IMPLANT
TUBE SUCTION HIGH CAP CLEAR NV (SUCTIONS) ×2 IMPLANT
WATER STERILE IRR 1000ML POUR (IV SOLUTION) ×2 IMPLANT
WRAP KNEE MAXI GEL POST OP (GAUZE/BANDAGES/DRESSINGS) ×2 IMPLANT

## 2023-03-10 NOTE — Interval H&P Note (Signed)
History and Physical Interval Note:  03/10/2023 1:11 PM  Norma Anthony  has presented today for surgery, with the diagnosis of left total knee infection.  The various methods of treatment have been discussed with the patient and family. After consideration of risks, benefits and other options for treatment, the patient has consented to  Procedure(s): Irrigation and debridement left knee, resection total knee arthroplasty, antibiotic spacer placement (Left) as a surgical intervention.  The patient's history has been reviewed, patient examined, no change in status, stable for surgery.  I have reviewed the patient's chart and labs.  Questions were answered to the patient's satisfaction.    The risks, benefits, and alternatives were discussed with the patient. There are risks associated with the surgery including, but not limited to, problems with anesthesia (death), infection, instability (giving out of the joint), dislocation, differences in leg length/angulation/rotation, fracture of bones, loosening or failure of implants, hematoma (blood accumulation) which may require surgical drainage, blood clots, pulmonary embolism, nerve injury (foot drop and lateral thigh numbness), and blood vessel injury. The patient understands these risks and elects to proceed.    Hilton Cork Khyleigh Furney

## 2023-03-10 NOTE — Op Note (Signed)
OPERATIVE REPORT   03/10/2023  5:15 PM  PATIENT:  Norma Anthony   SURGEON:  Bertram Savin, MD  ASSISTANT:  Larene Pickett, PA-C.   PREOPERATIVE DIAGNOSIS:  1. 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  POSTOPERATIVE DIAGNOSIS:  Same.  PROCEDURE:  1. Irrigation and debridement left knee with resection left total knee arthroplasty 2. Placement of static antibiotic spacer 3. Application of negative pressure incisional dressing.  ANESTHESIA:   GETA.  ANTIBIOTICS:  2 g Ancef. 1 g vancomycin.  IMPLANTS:  Static antibiotic spacer utilizing threaded Steinmann pins and Biomet bone cement with 2.4g tobramycin per pack.  EXPLANTS: DePuy Attune PS femur size 6. Attune RP tibia size 5. Attune Aox RP poly size 5 mm. 35 mm all poly patella.  SPECIMENS:  Left knee pseudocapsule for tissue culture.  DRAINS: 1. Medium HV in knee joint x1. 2. 10 mm flat perforated JP drain in subcutaneous tissue x1. 3. Prevena custom negative pressure dressing at 75 mmHg.  COMPLICATIONS:  None.  DISPOSITION:  Stable to PACU.  SURGICAL INDICATIONS:  Norma Anthony is a 67 y.o. female who underwent primary left total knee arthroplasty by Dr. Tonita Cong on 11/04/2022 with DePuy attune RP knee system.  The patient subsequently fell, resulting in traumatic dehiscence of her incision on 12/11/2022.  Dr. Stann Mainland was the physician on-call and took her to the operating room.  At the time of surgery, he found that she had a subacute failure of her medial arthrotomy repair.  He did a revision repair of the arthrotomy and closed her skin.  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  Dr. Tonita Cong aspirated her knee and sent the synovial fluid for Synovasure analysis, which was completely negative for infection.  I saw her in the office and she was found to have gross patellar instability with palpable defect over  the medial peripatellar retinacular tissues.  I took her to the OR on 02/03/2023 for repair of medial parapatellar arthrotomy and polyliner exchange.  Postoperatively, she developed a subcutaneous fluid collection, which I aspirated in the office on 03/02/2023.  She was started on p.o doxycycline after the aspiration.  Her cultures ended up growing MSSA.  She subsequently also developed areas of skin edge necrosis in the incision that began draining purulent material.  She was also experiencing fevers and malaise.  She was direct admitted to the hospital earlier this week and started on IV Ancef.  Admission blood work revealed C-reactive protein 116 mg/L and sed rate 135 mm/h.  She was indicated for two-stage procedure.  The risks, benefits, and alternatives to the procedure were explained, and the patient elected to proceed.  The risks, benefits, and alternatives were discussed with the patient. There are risks associated with the surgery including, but not limited to, problems with anesthesia (death), infection, instability (giving out of the joint), dislocation, differences in leg length/angulation/rotation, fracture of bones, loosening or failure of implants, hematoma (blood accumulation) which may require surgical drainage, blood clots, pulmonary embolism, nerve injury (foot drop and lateral thigh numbness), and blood vessel injury. The patient understands these risks and elects to proceed.  The patient was correctly identified in the preop holding area using 2 identifiers.  The surgical site was marked by myself.  Regional block was placed by anesthesia.  She was taken to the operating room, and placed supine on the operating room table.  General anesthesia was induced.  Foley catheter  was placed.  The left lower extremity was prepped and draped in the normal sterile surgical fashion.  Timeout was called, verifying site and site of surgery.  Patient did receive IV antibiotics within 60 minutes of beginning  the procedure.  Gravity exsanguination was utilized, and I elevated the tourniquet to 350 mmHg.  I began by using a #10 blade to excise her previous anterior skin incision.  Her areas of skin breakdown were ellipsed out full-thickness with a #10 blade.  I raised full-thickness skin flaps.  Her medial parapatellar arthrotomy was well-healed.  Her collagen augmentation graft had completely incorporated.  She did have purulent material within the entire subcutaneous plane.  The wound was debrided with a Cobb and irrigated with normal saline using pulsatile lavage.  Since she had a lateral release, there was obviously communication of the subcutaneous abscess with the knee joint.  The sutures over the arthrotomy repair site were cut with Mayo scissors.  The medial parapatellar arthrotomy was opened.  All suture material was removed with a rongeur.  I brought the knee into extension.  Using Bovie electrocautery, I sharply excised the infrapatellar scar.  I performed a radical synovectomy of the medial and lateral gutters.  A sample of the pseudocapsule was sent for tissue culture.  I excisionally debrided all scar tissue from around the patella.  Using an ACL saw blade, I disrupted the implant cement interface from the femoral and tibial components.  Shukla knee extraction osteotomes were used to further disrupt the interface.  The femoral and tibial components were then removed with very minimal bone loss.  The underlying bone on the distal femur was extremely soft in nature.  All unstable, compromised bony material was excisionally removed with a rogeur.  The cement mantle were removed with osteotomes.  There was no significant bone loss to the proximal tibia.  Using an extramedullary cutting guide, I freshened up the proximal tibia cut, taking approximately 2 mm of bone.   I sequentially reamed up to an 11 mm reamer in the tibia and 14 mm reamer in the femur.  The canals were debrided with curettes and also the  reamers.  The knee was then brought into extension.  I disrupted the bone implant interface of the patellar component.  The pegs were removed with drill pins.  The cement was debrided with curettes.  Once I was satisfied that all cement material had been removed, the knee was then copiously irrigated with Irrisept solution and 6 L of normal saline using pulsatile lavage.  The tourniquet was then let down, meticulous hemostasis was achieved with the aqua mantis and Bovie electrocautery.  On the back table, a batch of cement was mixed with 2.4 g of tobramycin per pack.  I selected to threaded Steinmann pins which were coated completely with cement.  The Steinmann pins were placed into the distal femur and proximal tibia.  A separate batch of cement was then mixed with 2.4 g of tobramycin per pack.  The knee was brought into extension, and traction was held with the [facility] positioner.  Cement was placed into the knee around the Steinmann pins, forming a static spacer.  Once the cement was hardened, traction was released from the [facility].  The arthrotomy was closed using #1 PDS over a medium Hemovac drain.  Using a #10 blade, a stab incision was made over the lateral aspect of the thigh, and I placed a 10 mm perforated JP drain into the subcutaneous tissue.  The drain  was sewn in with a 3-0 nylon suture.  The deep dermal layer was repaired with 2-0 Monocryl.  The skin was reapproximated with 2-0 nylon vertical mattress sutures distally, and skin staples proximally.  A negative pressure incisional dressing was created using Adaptic and black Granufoam sponge.  Suction was applied at 75 mmHg.  There was excellent seal without any leak.  A compressive dressing were applied.  A knee immobilizer was applied.  The patient was then awakened from anesthesia, and transported to the recovery room in stable condition.  Sponge, needle, and instrument counts were correct at the end of the case x2.  The patient tolerated the  procedure well and there were no known complications.  POSTOPERATIVE PLAN: Postoperatively, the patient will be readmitted to the orthopedic floor.  She is to wear the knee immobilizer at all times except for hygiene.  She has a static spacer, and therefore must not try to bend the knee.  She may touchdown weight-bear left lower extremity in the knee immobilizer.  We will continue IV Ancef for now.  Follow intraoperative culture.  We will order placement of a PICC line.  We will obtain infectious disease consult for antibiotic selection and duration.  Hemovac drain may be discontinued when drainage is less than 30 cc per shift.  JP dressing may be discontinued when drainage is less than 10 cc per shift.  Upon discharge, her house VAC suction unit will be exchanged for portable Prevena suction unit.  She will follow-up in the office within 7 days of discharge for removal of her negative pressure dressing.  Her sutures should be retained for approximately 3 weeks.

## 2023-03-10 NOTE — Transfer of Care (Signed)
Immediate Anesthesia Transfer of Care Note  Patient: Norma Anthony  Procedure(s) Performed: Irrigation and debridement left knee, resection total knee arthroplasty, antibiotic spacer placement (Left: Knee)  Patient Location: PACU  Anesthesia Type:General  Level of Consciousness: awake, sedated, and patient cooperative  Airway & Oxygen Therapy: Patient Spontanous Breathing and Patient connected to face mask oxygen  Post-op Assessment: Report given to RN, Post -op Vital signs reviewed and stable, and Patient moving all extremities X 4  Post vital signs: Reviewed and stable  Last Vitals:  Vitals Value Taken Time  BP 140/83 03/10/23 1733  Temp    Pulse 79 03/10/23 1738  Resp 16 03/10/23 1738  SpO2 96 % 03/10/23 1738  Vitals shown include unvalidated device data.  Last Pain:  Vitals:   03/10/23 1144  TempSrc:   PainSc: 5       Patients Stated Pain Goal: 3 ([id number])  Complications: No notable events documented.

## 2023-03-10 NOTE — Plan of Care (Signed)
  Problem: Activity: Goal: Risk for activity intolerance will decrease Outcome: Progressing   Problem: Nutrition: Goal: Adequate nutrition will be maintained Outcome: Progressing   Problem: Pain Managment: Goal: General experience of comfort will improve Outcome: Progressing   Problem: Safety: Goal: Ability to remain free from injury will improve Outcome: Progressing   

## 2023-03-10 NOTE — H&P (Signed)
ORTHOPAEDIC H&P  REQUESTING PHYSICIAN: Rod Can, MD  PCP:  Clinic, [facility]  Chief Complaint: Surgical site infection left knee  HPI: Norma Anthony is a 67 y.o. female with a complex history regarding her left knee.  She underwent primary left total knee arthroplasty by Dr. Tonita Cong on 11/04/2022.  She subsequently fell, sustaining a atraumatic dehiscence of her incision on 12/11/2022.  Dr. Stann Mainland was on-call and took her to the operating room for debridement and closure.  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  Dr. Tonita Cong worked her up for infection which was completely negative.  He then sent her to me for second opinion.  I took her to the operating room on 02/03/2023 for repair of her arthrotomy.  Last week, she came to the office with a subcutaneous fluid collection.  I aspirated the fluid collection and sent it to the lab for culture.  I placed her on doxycycline while the culture was pending.  She returned to the office on 03/08/2023 with multiple small areas of incisional dehiscence draining purulent material.  The culture was positive for MSSA.  Due to malaise and fevers, I directly admitted her to the hospital on the same day.  She was started on IV Ancef.  Past Medical History:  Diagnosis Date   Anemia    Anxiety    Arthritis    CAD (coronary artery disease)    COPD (chronic obstructive pulmonary disease) (HCC)    Depression    Dyspnea    Fibromyalgia    GERD (gastroesophageal reflux disease)    Hiatal hernia    History of kidney stones    Migraine headache    Multinodular goiter    Nephrolithiasis    Pneumonia    Pulmonary embolism ([place]) 1996   Sleep apnea    Thyroid nodule    Past Surgical History:  Procedure Laterality Date   ABDOMINAL HYSTERECTOMY     BIOPSY THYROID     HERNIA REPAIR     I & D EXTREMITY Left 12/11/2022    Procedure: IRRIGATION AND DEBRIDEMENT EXTREMITY;  Surgeon: Nicholes Stairs, MD;  Location: [facility];  Service: Orthopedics;  Laterality: Left;   IVC FILTER INSERTION N/A 09/28/2022   Procedure: IVC FILTER INSERTION;  Surgeon: Waynetta Sandy, MD;  Location: [place] [facility];  Service: Cardiovascular;  Laterality: N/A;   IVC FILTER REMOVAL N/A 01/18/2023   Procedure: IVC FILTER REMOVAL;  Surgeon: Waynetta Sandy, MD;  Location: [place] [facility];  Service: Cardiovascular;  Laterality: N/A;   KIDNEY STONE SURGERY     KNEE ARTHROTOMY Left 02/03/2023   Procedure: KNEE ARTHROTOMY WITH SOFT TISSUE REPAIR;  Surgeon: Rod Can, MD;  Location: [facility];  Service: Orthopedics;  Laterality: Left;  76   LAPAROSCOPIC NISSEN FUNDOPLICATION     PAROTIDECTOMY     TONSILLECTOMY     TOTAL KNEE ARTHROPLASTY Left 11/04/2022   Procedure: TOTAL KNEE ARTHROPLASTY;  Surgeon: Susa Day, MD;  Location: [facility];  Service: Orthopedics;  Laterality: Left;   Social History   Socioeconomic History   Marital status: Married    Spouse name: Not on file   Number of children: Not on file   Years of education: Not on file   Highest education level: Not on file  Occupational History   Not on file  Tobacco Use   Smoking status:  Former    Packs/day: 0.50    Years: 10.00    Additional pack years: 0.00    Total pack years: 5.00    Types: Cigarettes    Quit date: 02/12/2021    Years since quitting: 2.0   Smokeless tobacco: Never  Vaping Use   Vaping Use: Never used  Substance and Sexual Activity   Alcohol use: Not Currently   Drug use: Not Currently   Sexual activity: Not Currently  Other Topics Concern   Not on file  Social History Narrative   Not on file   Social Determinants of Health   Financial Resource Strain: Not on file  Food Insecurity: No Food Insecurity (03/08/2023)   Hunger Vital Sign    Worried About Running Out of Food in the Last Year: Never true    Ran Out of  Food in the Last Year: Never true  Transportation Needs: No Transportation Needs (03/08/2023)   PRAPARE - Hydrologist (Medical): No    Lack of Transportation (Non-Medical): No  Physical Activity: Not on file  Stress: Not on file  Social Connections: Not on file   Family History  Problem Relation Age of Onset   Hypertension Mother    Allergies  Allergen Reactions   Lyrica [Pregabalin] Other (See Comments)    Paralysis present (finding)- not tolerated Pt stated she could not move her body but she could think      Belbuca [Buprenorphine Hcl] Other (See Comments)    " Horrible headache"    Compazine [Prochlorperazine] Anxiety   Droperidol Anxiety   Ergotamine-Caffeine Anxiety and Hypertension    Increased systolic arterial pressure (finding)    Prior to Admission medications   Medication Sig Start Date End Date Taking? Authorizing Provider  albuterol (VENTOLIN HFA) 108 (90 Base) MCG/ACT inhaler Inhale 2 puffs into the lungs every 6 (six) hours as needed for wheezing or shortness of breath. 05/12/21  Yes [provider]  ALPRAZolam Duanne Moron) 1 MG tablet Take 1 mg by mouth 4 (four) times daily as needed for anxiety or sleep. 12/29/22  Yes [provider]  Aspirin-Acetaminophen-Caffeine (GOODY HEADACHE PO) Take 1-2 Packages by mouth daily as needed (Pain/Fever).   Yes [provider]  b complex vitamins capsule Take 1 capsule by mouth daily.   Yes [provider]  ciprofloxacin (CIPRO) 250 MG tablet Take 250 mg by mouth 2 (two) times daily. 03/05/23  Yes [provider]  clindamycin-benzoyl peroxide (BENZACLIN) gel Apply 1 Application topically 2 (two) times daily as needed (for acne breakouts).   Yes [provider]  clotrimazole-betamethasone (LOTRISONE) cream Apply 1 Application topically 2 (two) times daily as needed (for irritation under the breasts or abdominal folds).   Yes [provider]   diphenhydrAMINE (SOMINEX) 25 MG tablet Take 50-75 mg by mouth daily as needed for allergies or sleep.   Yes [provider]  doxycycline (VIBRAMYCIN) 100 MG capsule Take 100 mg by mouth 2 (two) times daily. 03/02/23  Yes [provider]  DULoxetine (CYMBALTA) 60 MG capsule Take 60 mg by mouth at bedtime. 09/14/18  Yes [provider]  famotidine (PEPCID) 20 MG tablet One after supper Patient taking differently: Take 20 mg by mouth 2 (two) times daily. 04/30/22  Yes Tanda Rockers, MD  Ferrous Fumarate-Folic Acid 99991111 MG TABS Take 1 tablet by mouth daily. Hematinic 02/29/20  Yes [provider]  fluticasone (FLONASE) 50 MCG/ACT nasal spray Place 1 spray into both  nostrils daily as needed for allergies or rhinitis.   Yes [provider]  gabapentin (NEURONTIN) 400 MG capsule Take 400 mg by mouth at bedtime. 08/21/22  Yes [provider]  melatonin 5 MG TABS Take 10 mg by mouth at bedtime.   Yes [provider]  Multiple Vitamins-Minerals (HAIR SKIN AND NAILS FORMULA) TABS Take 1 tablet by mouth daily.   Yes [provider]  naloxone (NARCAN) nasal spray 4 mg/0.1 mL Place 1 spray into the nose once as needed (accidental over dose). 10/16/21  Yes [provider]  oxyCODONE-acetaminophen (PERCOCET) 10-325 MG tablet Take 1 tablet by mouth 4 (four) times daily as needed. 03/02/23  Yes [provider]  pantoprazole (PROTONIX) 40 MG tablet Take 40 mg by mouth daily with breakfast. 06/30/16  Yes [provider]  promethazine (PHENERGAN) 25 MG tablet Take 25 mg by mouth every 6 (six) hours as needed for nausea or vomiting.   Yes [provider]  salicylic acid-lactic acid (CORN & CALLUS REMOVER) 17 % external solution Apply 1 Application topically daily as needed (corns).   Yes [provider]  tiZANidine (ZANAFLEX) 2 MG tablet Take 4 mg by mouth at bedtime. 01/09/23  Yes [provider]   XARELTO 20 MG TABS tablet Take 20 mg by mouth at bedtime. 08/21/21  Yes [provider]  ondansetron (ZOFRAN) 4 MG tablet Take 1 tablet (4 mg total) by mouth every 8 (eight) hours as needed for nausea or vomiting. Patient not taking: Reported on 03/09/2023 02/05/23 02/05/24  Charlott Rakes, PA-C  oxyCODONE (ROXICODONE) 5 MG immediate release tablet Take 1-2 tablets (5-10 mg total) by mouth every 6 (six) hours as needed for severe pain. Patient not taking: Reported on 03/09/2023 02/05/23   Charlott Rakes, PA-C   No results found.  Positive ROS: All other systems have been reviewed and were otherwise negative with the exception of those mentioned in the HPI and as above.  Physical Exam: General: Alert, no acute distress Cardiovascular: No pedal edema Respiratory: No cyanosis, no use of accessory musculature GI: No organomegaly, abdomen is soft and non-tender Skin: No lesions in the area of chief complaint Neurologic: Sensation intact distally Psychiatric: Patient is competent for consent with normal mood and affect Lymphatic: No axillary or cervical lymphadenopathy  MUSCULOSKELETAL: Examination of the left knee reveals multiple areas of skin dehiscence draining purulent material.  She is neurovascularly intact.  Assessment: Surgical site infection left knee. MSSA PJI left knee. Compromised soft tissue envelope left knee.  Plan: I discussed the findings with the patient.  This is an extremely unfortunate situation.  At this point, we need to act aggressively in order to save her leg.  I recommend debridement of the left knee, resection left total knee arthroplasty, and placement of antibiotic impregnated spacer.  She will need infectious disease consult, PICC line, and prolonged IV antibiotics.  If we have difficulty closing her soft tissue envelope, she may require plastic surgery consult.  Plan for surgery Wednesday morning.  N.p.o. after midnight.  We will start Lovenox, last dose  Tuesday morning.  All questions solicited and answered.    Bertram Savin, MD [phone]    03/10/2023 7:34 AM

## 2023-03-10 NOTE — Anesthesia Postprocedure Evaluation (Signed)
Anesthesia Post Note  Patient: Norma Anthony  Procedure(s) Performed: Irrigation and debridement left knee, resection total knee arthroplasty, antibiotic spacer placement (Left: Knee)     Patient location during evaluation: PACU Anesthesia Type: General Level of consciousness: patient cooperative, oriented and sedated Pain management: pain level controlled Vital Signs Assessment: post-procedure vital signs reviewed and stable Respiratory status: spontaneous breathing, nonlabored ventilation, respiratory function stable and patient connected to nasal cannula oxygen Cardiovascular status: blood pressure returned to baseline and stable Postop Assessment: no apparent nausea or vomiting Anesthetic complications: no   No notable events documented.  Last Vitals:  Vitals:   03/10/23 1745 03/10/23 1800  BP: (!) 156/82 (!) 165/93  Pulse: 82 86  Resp: 19 13  Temp:    SpO2: 94% 93%    Last Pain:  Vitals:   03/10/23 1800  TempSrc:   PainSc: 6                  Ninel Abdella,E. Milcah Dulany

## 2023-03-10 NOTE — Anesthesia Procedure Notes (Signed)
Procedure Name: Intubation Date/Time: 03/10/2023 1:32 PM  Performed by: Jonna Munro, CRNAPre-anesthesia Checklist: Patient identified, Emergency Drugs available, Suction available, Patient being monitored and Timeout performed Patient Re-evaluated:Patient Re-evaluated prior to induction Oxygen Delivery Method: Circle system utilized Preoxygenation: Pre-oxygenation with 100% oxygen Induction Type: IV induction Ventilation: Mask ventilation without difficulty Laryngoscope Size: Mac and 3 Grade View: Grade I Tube type: Oral Tube size: 7.0 mm Number of attempts: 1 Airway Equipment and Method: Stylet Placement Confirmation: ETT inserted through vocal cords under direct vision, positive ETCO2, CO2 detector and breath sounds checked- equal and bilateral Secured at: 23 cm Tube secured with: Tape Dental Injury: Teeth and Oropharynx as per pre-operative assessment

## 2023-03-10 NOTE — Anesthesia Preprocedure Evaluation (Addendum)
Anesthesia Evaluation  Patient identified by MRN, date of birth, ID band Patient awake    Reviewed: Allergy & Precautions, NPO status , Patient's Chart, lab work & pertinent test results  History of Anesthesia Complications Negative for: history of anesthetic complications  Airway Mallampati: I  TM Distance: >3 FB Neck ROM: Full    Dental  (+) Edentulous Upper, Edentulous Lower   Pulmonary sleep apnea , COPD (has home O2, does not use),  COPD inhaler, former smoker   breath sounds clear to auscultation       Cardiovascular (-) angina  Rhythm:Regular Rate:Normal  '22 ECHO: Left ventricle is normal in size and function with EF 60-65%.  Normal  LV wall thickness.  Normal diastolic function.  2.  Right ventricle is normal in size and function.  3.  No valvular abnormalities.  4.  Normal right ventricular systolic pressure.  No evidence of pulmonary  hypertension.     Neuro/Psych  Headaches  Anxiety Depression       GI/Hepatic Neg liver ROS,GERD  Medicated and Controlled,,  Endo/Other    Morbid obesityBMI 40  Renal/GU stones     Musculoskeletal  (+) Arthritis ,  Fibromyalgia -  Abdominal  (+) + obese  Peds  Hematology  (+) Blood dyscrasia (Hb 8.8), anemia Xarelto   Anesthesia Other Findings   Reproductive/Obstetrics                             Anesthesia Physical Anesthesia Plan  ASA: 3  Anesthesia Plan: General   Post-op Pain Management: Tylenol PO (pre-op)*   Induction: Intravenous  PONV Risk Score and Plan: 3 and Ondansetron, Dexamethasone and Scopolamine patch - Pre-op  Airway Management Planned: Oral ETT  Additional Equipment: None  Intra-op Plan:   Post-operative Plan: Extubation in OR  Informed Consent: I have reviewed the patients History and Physical, chart, labs and discussed the procedure including the risks, benefits and alternatives for the proposed anesthesia with  the patient or authorized representative who has indicated his/her understanding and acceptance.       Plan Discussed with: CRNA and Surgeon  Anesthesia Plan Comments:         Anesthesia Quick Evaluation

## 2023-03-10 NOTE — Progress Notes (Incomplete)
PHARMACY CONSULT NOTE FOR:  OUTPATIENT  PARENTERAL ANTIBIOTIC THERAPY (OPAT)  Indication: MSSA L-knee PJI Regimen: Cefazolin 2g IV every 8 hours End date: 04/21/23  IV antibiotic discharge orders are pended. To discharging provider:  please sign these orders via discharge navigator,  Select New Orders & click on the button choice - Manage This Unsigned Work.     Thank you for allowing pharmacy to be a part of this patient's care.  Alycia Rossetti, PharmD, BCPS Infectious Diseases Clinical Pharmacist 03/11/2023 10:48 AM   **Pharmacist phone directory can now be found on [email or web address] ([email or web address]).  Listed under [place].

## 2023-03-11 ENCOUNTER — Other Ambulatory Visit: Payer: Self-pay

## 2023-03-11 ENCOUNTER — Encounter (HOSPITAL_COMMUNITY): Payer: Self-pay | Admitting: Orthopedic Surgery

## 2023-03-11 DIAGNOSIS — T8454XD Infection and inflammatory reaction due to internal left knee prosthesis, subsequent encounter: Secondary | ICD-10-CM | POA: Diagnosis not present

## 2023-03-11 DIAGNOSIS — T8459XA Infection and inflammatory reaction due to other internal joint prosthesis, initial encounter: Secondary | ICD-10-CM

## 2023-03-11 LAB — BASIC METABOLIC PANEL
Anion gap: 7 (ref 5–15)
BUN: 8 mg/dL (ref 8–23)
CO2: 27 mmol/L (ref 22–32)
Calcium: 7.7 mg/dL — ABNORMAL LOW (ref 8.9–10.3)
Chloride: 102 mmol/L (ref 98–111)
Creatinine, Ser: 0.56 mg/dL (ref 0.44–1.00)
GFR, Estimated: >60 mL/min (ref >60)
Glucose, Bld: 145 mg/dL — ABNORMAL HIGH (ref 70–99)
Potassium: 4.4 mmol/L (ref 3.5–5.1)
Sodium: 136 mmol/L (ref 135–145)

## 2023-03-11 LAB — CBC
HCT: 28.7 % — ABNORMAL LOW (ref 36.0–46.0)
Hemoglobin: 9.3 g/dL — ABNORMAL LOW (ref 12.0–15.0)
MCH: 30 pg (ref 26.0–34.0)
MCHC: 32.4 g/dL (ref 30.0–36.0)
MCV: 92.6 fL (ref 80.0–100.0)
Platelets: 375 10*3/uL (ref 150–400)
RBC: 3.1 MIL/uL — ABNORMAL LOW (ref 3.87–5.11)
RDW: 16.2 % — ABNORMAL HIGH (ref 11.5–15.5)
WBC: 19.7 10*3/uL — ABNORMAL HIGH (ref 4.0–10.5)
nRBC: 0.2 % (ref 0.0–0.2)

## 2023-03-11 LAB — TYPE AND SCREEN
ABO/RH(D): A POS
Antibody Screen: NEGATIVE
Unit division: 0
Unit division: 0

## 2023-03-11 LAB — BPAM RBC
Blood Product Expiration Date: 202404202359
Blood Product Expiration Date: 202404202359
ISSUE DATE / TIME: 202403271442
ISSUE DATE / TIME: 202403271442
Unit Type and Rh: 6200
Unit Type and Rh: 6200

## 2023-03-11 MED ORDER — ZINC SULFATE 220 (50 ZN) MG PO CAPS
220.0000 mg | ORAL_CAPSULE | Freq: Every day | ORAL | Status: DC
  Administered 2023-03-11 – 2023-03-16 (×6): 220 mg via ORAL
  Filled 2023-03-11 (×6): qty 1

## 2023-03-11 MED ORDER — SODIUM CHLORIDE 0.9% FLUSH
10.0000 mL | Freq: Two times a day (BID) | INTRAVENOUS | Status: DC
  Administered 2023-03-13 – 2023-03-15 (×4): 10 mL

## 2023-03-11 MED ORDER — ACETAMINOPHEN 325 MG PO TABS
650.0000 mg | ORAL_TABLET | Freq: Four times a day (QID) | ORAL | Status: DC | PRN
  Administered 2023-03-11 – 2023-03-16 (×5): 650 mg via ORAL
  Filled 2023-03-11 (×7): qty 2

## 2023-03-11 MED ORDER — CHLORHEXIDINE GLUCONATE CLOTH 2 % EX PADS
6.0000 | MEDICATED_PAD | Freq: Every day | CUTANEOUS | Status: DC
  Administered 2023-03-12 – 2023-03-16 (×5): 6 via TOPICAL

## 2023-03-11 MED ORDER — VITAMIN C 500 MG PO TABS
1000.0000 mg | ORAL_TABLET | Freq: Every day | ORAL | Status: DC
  Administered 2023-03-11 – 2023-03-16 (×6): 1000 mg via ORAL
  Filled 2023-03-11 (×6): qty 2

## 2023-03-11 MED ORDER — SODIUM CHLORIDE 0.9% FLUSH: 10.0000 mL | INTRAVENOUS | Status: DC | PRN

## 2023-03-11 NOTE — TOC Initial Note (Signed)
Transition of Care [facility]) - Initial/Assessment Note    Patient Details  Name: Norma Anthony MRN: [id number] Date of Birth: Oct 29, 1956  Transition of Care [facility]) CM/SW Contact:    Lennart Pall, LCSW Phone Number: 03/11/2023, 2:10 PM  Clinical Narrative:                 Met with pt and spouse today to review anticipated dc needs.  Pt admits she is having much pain and is frustrated with her overall situation.  They both are concerned with her current, significant functional limitations and feel that she will need SNF rehab at discharge.  Spouse does not feel he could provided level of assistance needed and pt agrees.  Have alerted tx team and will begin SNF bed search process.  Pt still awaiting PICC placement - PA/RN aware.    Expected Discharge Plan: Skilled Nursing Facility Barriers to Discharge: Continued Medical Work up, SNF Pending bed offer, Insurance Authorization   Patient Goals and CMS Choice Patient states their goals for this hospitalization and ongoing recovery are:: return home following SNF rehab CMS Medicare.gov Compare Post Acute Care list provided to:: Patient Choice offered to / list presented to : Patient [place] ownership interest in [facility].provided to:: Patient    Expected Discharge Plan and Services In-house Referral: Clinical Social Work   Post Acute Care Choice: [place] Living arrangements for the past 2 months: [place]                 DME Arranged: N/A DME Agency: NA                  Prior Living Arrangements/Services Living arrangements for the past 2 months: Single Family Home Lives with:: Spouse Patient language and need for interpreter reviewed:: Yes Do you feel safe going back to the place where you live?: Yes      Need for Family Participation in Patient Care: Yes (Comment) Care giver support system in place?: Yes (comment)   Criminal Activity/Legal Involvement Pertinent to Current  Situation/Hospitalization: No - Comment as needed  Activities of Daily Living Home Assistive Devices/Equipment: Blood pressure cuff, Bedside commode/3-in-1, Cane (specify quad or straight), Eyeglasses, Shower chair without back, Walker (specify type) (rolling walker) ADL Screening (condition at time of admission) Patient's cognitive ability adequate to safely complete daily activities?: Yes Is the patient deaf or have difficulty hearing?: No Does the patient have difficulty seeing, even when wearing glasses/contacts?: No Does the patient have difficulty concentrating, remembering, or making decisions?: No Patient able to express need for assistance with ADLs?: Yes Does the patient have difficulty dressing or bathing?: Yes Independently performs ADLs?: No Communication: Independent Dressing (OT): Needs assistance Is this a change from baseline?: Pre-admission baseline Grooming: Needs assistance Is this a change from baseline?: Pre-admission baseline Feeding: Independent Bathing: Needs assistance Is this a change from baseline?: Pre-admission baseline Toileting: Needs assistance Is this a change from baseline?: Change from baseline, expected to last >3days In/Out Bed: Needs assistance Is this a change from baseline?: Change from baseline, expected to last >3 days Walks in Home: Needs assistance Is this a change from baseline?: Pre-admission baseline Does the patient have difficulty walking or climbing stairs?: Yes Weakness of Legs: Both Weakness of Arms/Hands: Both  Permission Sought/Granted Permission sought to share information with : Facility Sport and exercise psychologist, Family Supports Permission granted to share information with : Yes, Verbal Permission Granted        Permission granted to share  info w Relationship: Norma Anthony, Norma Anthony @ [phone]     Emotional Assessment Appearance:: Appears stated age Attitude/Demeanor/Rapport: Gracious, Engaged Affect (typically  observed): Accepting Orientation: : Oriented to Self, Oriented to Place, Oriented to  Time, Oriented to Situation Alcohol / Substance Use: Not Applicable Psych Involvement: No (comment)  Admission diagnosis:  Surgical wound dehiscence [T81.31XA] Patient Active Problem List   Diagnosis Date Noted   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) 12/11/2022   Generalized anxiety disorder 12/11/2022   Patellar dislocation, left, initial encounter 12/11/2022   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   DOE (dyspnea on exertion) 04/30/2022   Former smoker 04/30/2022   Upper airway cough syndrome 04/30/2022   Lung nodule 03/24/2022   Thyroid nodule 03/24/2022   Acute respiratory failure with hypoxia ([place]) 03/23/2022   Opioid dependence ([place]) 03/23/2022   Fibromyalgia 03/23/2022   OSA (obstructive sleep apnea) 03/23/2022   History of pulmonary embolus (PE) 03/23/2022   COPD (chronic obstructive pulmonary disease) ([place]) 03/22/2022   Chest pain 03/22/2022   GERD without esophagitis 03/22/2022   PCP:  Clinic, [place]:   [place], [place] - [address] AT [place] [place] [address] Phone: [phone] Fax: [phone]     Social Determinants of Health (SDOH) Social History: SDOH Screenings   Food Insecurity: No Food Insecurity (03/08/2023)  Housing: Low Risk  (03/08/2023)  Transportation Needs: No Transportation Needs (03/08/2023)  Utilities: Not At Risk (03/08/2023)  Tobacco Use: Medium Risk (03/10/2023)   SDOH Interventions:     Readmission  Risk Interventions    03/11/2023    2:01 PM 11/06/2022    9:37 AM  Readmission Risk Prevention Plan  Transportation Screening Complete Complete  PCP or Specialist Appt within 3-5 Days  Complete  HRI or Home Care Consult  Complete  Social Work Consult for [place] Planning/Counseling  Complete  Palliative Care Screening  Not Applicable  Medication Review Press photographer) Complete Complete  PCP or Specialist appointment within 3-5 days of discharge Complete   HRI or [place] Complete   SW Recovery Care/Counseling Consult Complete   Palliative Care Screening Not Applicable   Skilled Nursing Facility Complete

## 2023-03-11 NOTE — NC FL2 (Signed)
[place] LEVEL OF CARE FORM     IDENTIFICATION  Patient Name: Norma Anthony Birthdate: 1956-11-09 Sex: female Admission Date (Current Location): 03/08/2023  [facility] and [place] Number:  Herbalist and Address:  [facility],  [place], [place]      Provider Number: [id number]  Attending Physician Name and Address:  Rod Can, MD  Relative Name and Phone Number:  spouse, Muriel Babers @ [phone]    Current Level of Care: Hospital Recommended Level of Care: [place] Prior Approval Number:    Date Approved/Denied:   PASRR Number: [id number]  Discharge Plan: SNF    Current Diagnoses: Patient Active Problem List   Diagnosis Date Noted   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   Surgical wound dehiscence 03/08/2023   Failed total knee, 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   Patellar dislocation 12/11/2022   Hemarthrosis of left knee 12/11/2022   Wound dehiscence, surgical, initial encounter 12/11/2022   Chronic respiratory failure with hypoxia ([place]) 12/11/2022   Generalized anxiety disorder 12/11/2022   Patellar dislocation, left, initial encounter 12/11/2022   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   DOE (dyspnea on exertion) 04/30/2022   Former smoker 04/30/2022   Upper airway cough syndrome 04/30/2022   Lung nodule 03/24/2022   Thyroid nodule 03/24/2022   Acute respiratory failure with hypoxia ([place]) 03/23/2022   Opioid dependence ([place]) 03/23/2022   Fibromyalgia 03/23/2022   OSA (obstructive sleep apnea) 03/23/2022   History of pulmonary embolus ([place]) 03/23/2022   COPD (chronic obstructive pulmonary disease) ([place]) 03/22/2022   Chest pain 03/22/2022   GERD without esophagitis 03/22/2022    Orientation RESPIRATION BLADDER Height & Weight     Self, Time, Situation, Place  O2 Continent, External catheter (currently  with purewick) Weight: 242 lb 15.2 oz (110.2 kg) Height:  5\' 5"  (165.1 cm)  BEHAVIORAL SYMPTOMS/MOOD NEUROLOGICAL BOWEL NUTRITION STATUS      Continent Diet  AMBULATORY STATUS COMMUNICATION OF NEEDS Skin   Extensive Assist Verbally Other (Comment) (surgical incision; currently with wound VAC but will dc wtih just a prevena VAC)                       Personal Care Assistance Level of Assistance  Bathing, Dressing Bathing Assistance: Limited assistance   Dressing Assistance: Limited assistance     Functional Limitations Info  Sight, Hearing, Speech Sight Info: Adequate Hearing Info: Adequate Speech Info: Adequate    SPECIAL CARE FACTORS FREQUENCY  PT (By licensed PT), OT (By licensed OT)     PT Frequency: 5x/wk OT Frequency: 5x/wk            Contractures Contractures Info: Not present    Additional Factors Info  Code Status, Allergies, Psychotropic Code Status Info: Full Allergies Info: Lyrica (Pregabalin), Belbuca (Buprenorphine Hcl), Compazine (Prochlorperazine), Droperidol, Ergotamine-caffeine Psychotropic Info: see MAR         Current Medications (03/11/2023):  This is the current hospital active medication list Current Facility-Administered Medications  Medication Dose Route Frequency Provider Last Rate Last Admin   0.9 %  sodium chloride infusion   Intravenous Continuous Swinteck, Aaron Edelman, MD   Stopped at 03/10/23 1130   albuterol (PROVENTIL) (2.5 MG/3ML) 0.083% nebulizer solution 2.5 mg  2.5 mg Nebulization Q6H PRN Swinteck, Aaron Edelman, MD       ALPRAZolam Duanne Moron) tablet 1 mg  1 mg Oral Q6H PRN  Rod Can, MD   1 mg at 03/10/23 2338   alum & mag hydroxide-simeth (MAALOX/MYLANTA) 200-200-20 MG/5ML suspension 30 mL  30 mL Oral Q4H PRN Swinteck, Aaron Edelman, MD       ceFAZolin (ANCEF) IVPB 2g/100 mL premix  2 g Intravenous Q8H Swinteck, Aaron Edelman, MD 200 mL/hr at 03/11/23 0541 2 g at 03/11/23 0541   diphenhydrAMINE (BENADRYL) 12.5 MG/5ML elixir 12.5-25 mg  12.5-25 mg Oral  Q4H PRN Rod Can, MD       docusate sodium (COLACE) capsule 100 mg  100 mg Oral BID Rod Can, MD   100 mg at 03/10/23 2155   DULoxetine (CYMBALTA) DR capsule 60 mg  60 mg Oral QHS Rod Can, MD   60 mg at 03/10/23 2155   gabapentin (NEURONTIN) capsule 400 mg  400 mg Oral TID Rod Can, MD   400 mg at 03/11/23 0813   HYDROmorphone (DILAUDID) injection 0.5-1 mg  0.5-1 mg Intravenous Q4H PRN Rod Can, MD   1 mg at 03/11/23 1250   melatonin tablet 10 mg  10 mg Oral QHS Swinteck, Aaron Edelman, MD   10 mg at 03/10/23 2155   menthol-cetylpyridinium (CEPACOL) lozenge 3 mg  1 lozenge Oral PRN Rod Can, MD       Or   phenol (CHLORASEPTIC) mouth spray 1 spray  1 spray Mouth/Throat PRN Swinteck, Aaron Edelman, MD       methocarbamol (ROBAXIN) tablet 500 mg  500 mg Oral Q6H PRN Rod Can, MD   500 mg at 03/11/23 1250   Or   methocarbamol (ROBAXIN) 500 mg in dextrose 5 % 50 mL IVPB  500 mg Intravenous Q6H PRN Swinteck, Aaron Edelman, MD       metoCLOPramide (REGLAN) tablet 5-10 mg  5-10 mg Oral Q8H PRN Swinteck, Aaron Edelman, MD       Or   metoCLOPramide (REGLAN) injection 5-10 mg  5-10 mg Intravenous Q8H PRN Swinteck, Aaron Edelman, MD       naloxone [facility]) nasal spray 4 mg/0.1 mL  1 spray Nasal Once PRN Rod Can, MD       ondansetron [facility]) tablet 4 mg  4 mg Oral Q6H PRN Swinteck, Aaron Edelman, MD       Or   ondansetron [facility]) injection 4 mg  4 mg Intravenous Q6H PRN Swinteck, Aaron Edelman, MD       oxyCODONE (Oxy IR/ROXICODONE) immediate release tablet 10-15 mg  10-15 mg Oral Q4H PRN Swinteck, Aaron Edelman, MD   15 mg at 03/11/23 1046   oxyCODONE (Oxy IR/ROXICODONE) immediate release tablet 5-10 mg  5-10 mg Oral Q4H PRN Rod Can, MD   5 mg at 03/10/23 1012   polyethylene glycol (MIRALAX / GLYCOLAX) packet 17 g  17 g Oral Daily PRN Swinteck, Aaron Edelman, MD       rivaroxaban Alveda [facility]asons) tablet 10 mg  10 mg Oral Q breakfast Rod Can, MD   10 mg at 03/11/23 0813   senna (SENOKOT) tablet 8.6 mg  1  tablet Oral BID Rod Can, MD   8.6 mg at 03/10/23 2155     Discharge Medications: Please see discharge summary for a list of discharge medications.  Relevant Imaging Results:  Relevant Lab Results:   Additional Information SSN: [id number]  Lennart Pall, LCSW

## 2023-03-11 NOTE — Progress Notes (Signed)
Pharmacy Antibiotic Note  Norma Anthony is a 67 y.o. female s/p revision repair of left medial parapatellar arthrotomy with augment on 02/03/23 who presented to [facility] on 03/08/2023 with left knee infection. Ortho team noted, "MSSA from synovasure office cultures." She underwent I&D left knee with left TKA resection and placement of abx spacer on 3/27.  She's currently on day #4 ancef for infection.   Plan: - continue ancef 2 gm IV q8h - ID team has been consulted - With stable renal function, pharmacy will sign off for ancef.  Reconsult [place] if need further assistance.  _________________________________________  Temp (24hrs), Avg:98.3 F (36.8 C), Min:97.5 F (36.4 C), Max:99.2 F (37.3 C)  Recent Labs  Lab 03/08/23 1455 03/10/23 0412 03/10/23 0733 03/11/23 0640  WBC 9.3  --  8.8 19.7*  CREATININE 0.67 0.64  --  0.56    Estimated Creatinine Clearance: 85.5 mL/min (by C-G formula based on SCr of 0.56 mg/dL).    Allergies  Allergen Reactions   Lyrica [Pregabalin] Other (See Comments)    Paralysis present (finding)- not tolerated Pt stated she could not move her body but she could think      Belbuca [Buprenorphine Hcl] Other (See Comments)    " Horrible headache"    Compazine [Prochlorperazine] Anxiety   Droperidol Anxiety   Ergotamine-Caffeine Anxiety and Hypertension    Increased systolic arterial pressure (finding)     Thank you for allowing pharmacy to be a part of this patient's care.  Lynelle Doctor 03/11/2023 10:49 AM

## 2023-03-11 NOTE — Evaluation (Signed)
Occupational Therapy Evaluation Patient Details Name: Norma Anthony MRN: [id number] DOB: Oct 04, 1956 Today's Date: 03/11/2023   History of Present Illness Pt s/p resectioon of L TKR with antibiotic spacer placement.  Pt with hx of COPD, TIA, and DM   Clinical Impression   This 67 yo female admitted and underwent above presents to acute OT with PLOF of needing minimal A for basic ADLs since she had knee surgery back in Nov 2023. Currently she has increased pain and only allowed TTWB on LLE so she is setup-Total A for basic ADLs at supine or sitting EOB level and Mod A +2 for all bed mobility. She will continue to benefit from acute OT with follow up therapies.      Recommendations for follow up therapy are one component of a multi-disciplinary discharge planning process, led by the attending physician.  Recommendations may be updated based on patient status, additional functional criteria and insurance authorization.   Assistance Recommended at Discharge Frequent or constant Supervision/Assistance  Patient can return home with the following Two people to help with walking and/or transfers;A lot of help with bathing/dressing/bathroom;Assistance with cooking/housework;Assist for transportation;Help with stairs or ramp for entrance    Functional Status Assessment  Patient has had a recent decline in their functional status and demonstrates the ability to make significant improvements in function in a reasonable and predictable amount of time.  Equipment Recommendations  Hospital bed;Other (comment) (hoyer lift if goes home)       Precautions / Restrictions Precautions Precautions: Fall;Knee;Other (comment) Precaution Comments: wound vac, JP and hemovacs Required Braces or Orthoses: Knee Immobilizer - Left Knee Immobilizer - Left: On at all times Restrictions Weight Bearing Restrictions: Yes LLE Weight Bearing: Touchdown weight bearing      Mobility Bed Mobility Overal bed mobility: Needs  Assistance Bed Mobility: Supine to Sit, Sit to Supine     Supine to sit: Mod assist, +2 for physical assistance, +2 for safety/equipment Sit to supine: Mod assist, +2 for physical assistance, +2 for safety/equipment   General bed mobility comments: cues for sequence and use of R LE to self assist.  Use of bedrail and physical assist to manage L LE, to control trunk and to complete rotation to/from EOB    Transfers                   General transfer comment: Deferred 2* pt pain level and elevated anxiety      Balance Overall balance assessment: Needs assistance Sitting-balance support: No upper extremity supported, Feet supported Sitting balance-Leahy Scale: Good                                     ADL either performed or assessed with clinical judgement   ADL Overall ADL's : Needs assistance/impaired Eating/Feeding: Independent;Sitting;Bed level   Grooming: Set up;Sitting;Bed level   Upper Body Bathing: Set up;Sitting;Bed level   Lower Body Bathing: Total assistance;Bed level   Upper Body Dressing : Set up;Sitting   Lower Body Dressing: Total assistance;Bed level                       Vision Patient Visual Report: No change from baseline              Pertinent Vitals/Pain Pain Assessment Pain Assessment: 0-10 Pain Score: 8  Pain Location: L knee Pain Descriptors / Indicators: Grimacing, Guarding, Aching, Sore Pain Intervention(s): Limited  activity within patient's tolerance, Monitored during session, Premedicated before session, Repositioned, Ice applied, Patient requesting pain meds-RN notified     Hand Dominance Right   Extremity/Trunk Assessment Upper Extremity Assessment Upper Extremity Assessment: Generalized weakness   Lower Extremity Assessment Lower Extremity Assessment: Difficult to assess due to impaired cognition LLE: Unable to fully assess due to immobilization       Communication  Communication Communication: No difficulties   Cognition Arousal/Alertness: Awake/alert Behavior During Therapy: WFL for tasks assessed/performed Overall Cognitive Status: Within Functional Limits for tasks assessed                                 General Comments: fearful of falling, verbal reinforcement needed and relaxation techniques used.     General Comments  VSS on RA            Home Living Family/patient expects to be discharged to:: Unsure Living Arrangements: Spouse/significant other Available Help at Discharge: Family;Available 24 hours/day Type of Home: House Home Access: Stairs to enter [employer] of Steps: 2 Entrance Stairs-Rails: None Home Layout: One level     Bathroom Shower/Tub: Teacher, early years/pre: Handicapped height Bathroom Accessibility: Yes   Home Equipment: Conservation officer, nature (2 wheels);Cane - single point;Wheelchair - Water quality scientist (4 wheels);BSC/3in1          Prior Functioning/Environment Prior Level of Function : Independent/Modified Independent;History of Falls (last six months)             Mobility Comments: Community ambulator, drives--prior to TKA; endorses multiple falls ADLs Comments: Has needed A for all ADLs since knee surgeries that started in Nov 2023        OT Problem List: Decreased strength;Decreased range of motion;Impaired balance (sitting and/or standing);Obesity;Pain      OT Treatment/Interventions: Self-care/ADL training;DME and/or AE instruction;Patient/family education;Balance training;Therapeutic exercise    OT Goals(Current goals can be found in the care plan section) Acute Rehab OT Goals Patient Stated Goal: to not go back to [facility] and left knee to finally be better OT Goal Formulation: With patient/family Time For Goal Achievement: 03/25/23 Potential to Achieve Goals: Good  OT Frequency: Min 2X/week    Co-evaluation PT/OT/SLP Co-Evaluation/Treatment:  Yes Reason for Co-Treatment: For patient/therapist safety PT goals addressed during session: Mobility/safety with mobility OT goals addressed during session: ADL's and self-care      AM-PAC OT "6 Clicks" Daily Activity     Outcome Measure Help from another person eating meals?: None Help from another person taking care of personal grooming?: A Little Help from another person toileting, which includes using toliet, bedpan, or urinal?: Total Help from another person bathing (including washing, rinsing, drying)?: A Lot Help from another person to put on and taking off regular upper body clothing?: A Little Help from another person to put on and taking off regular lower body clothing?: Total 6 Click Score: 14   End of Session Nurse Communication: Mobility status  Activity Tolerance: Patient tolerated treatment well Patient left: in bed;with call bell/phone within reach;with bed alarm set;with family/visitor present  OT Visit Diagnosis: Other abnormalities of gait and mobility (R26.89);Muscle weakness (generalized) (M62.81);Pain Pain - Right/Left: Left Pain - part of body: Knee;Leg                Time: [id number] OT Time Calculation (min): 34 min Charges:  OT General Charges $OT Visit: 1 Visit OT Evaluation $OT Eval Moderate Complexity: 1 Mod Cathy L. OT  Acute Rehabilitation Services Office [phone] ]  Almon Register 03/11/2023, 11:43 AM

## 2023-03-11 NOTE — Progress Notes (Addendum)
Subjective:  Patient reports pain as moderate.  Denies N/V/CP/SOB/Fevers/Chills/ Abd pain. She reports she has been having increased pain. She denies any tingling or numbness in LE bilaterally.   We discussed for her to keep her LEG straight and toe touch weightbearing with the walker and knee immobilizer at all times. We had discussion about plan for her this morning including consults. She expresses understanding.   Objective:   VITALS:   Vitals:   03/10/23 1851 03/10/23 2044 03/11/23 0148 03/11/23 0609  BP: (!) 162/95 (!) 164/88 (!) 149/69 (!) 179/109  Pulse: 88 96 94 96  Resp: 16 17 18 18   Temp: 97.6 F (36.4 C) 98.2 F (36.8 C) 98.6 F (37 C) 98.4 F (36.9 C)  TempSrc:  Oral Oral Oral  SpO2: 98% 94% 90% 95%  Weight:      Height:        Patient lying in bed. NAD. Her color has improved today.  Neurologically intact ABD soft Neurovascular intact Sensation intact distally Intact pulses distally Dorsiflexion/Plantar flexion intact No cellulitis present Compartment soft Negative pressure dressing: On house vac suction unit 75 mmHg, no leaks detected.  JP drain dressing C/D/I: Output 45cc SS fluid. Hemovac dressing C/D/I: Output 85cc bloody output.   Lab Results  Component Value Date   WBC 19.7 (H) 03/11/2023   HGB 9.3 (L) 03/11/2023   HCT 28.7 (L) 03/11/2023   MCV 92.6 03/11/2023   PLT 375 03/11/2023   BMET    Component Value Date/Time   NA 142 03/10/2023 0412   K 3.4 (L) 03/10/2023 0412   CL 107 03/10/2023 0412   CO2 26 03/10/2023 0412   GLUCOSE 92 03/10/2023 0412   BUN 7 (L) 03/10/2023 0412   CREATININE 0.64 03/10/2023 0412   CALCIUM 8.6 (L) 03/10/2023 0412   GFRNONAA >60 03/10/2023 0412   Results for orders placed or performed during the hospital encounter of 03/08/23  Culture, blood (Routine X 2) w Reflex to ID Panel     Status: None (Preliminary result)   Collection Time: 03/08/23  5:54 PM   Specimen: BLOOD  Result Value Ref Range Status    Specimen Description   Final    BLOOD SITE NOT SPECIFIED AEROBIC BOTTLE ONLY Performed at [facility], [place] [address] [place], [place] [address]    Special Requests   Final    BOTTLES DRAWN AEROBIC ONLY Blood Culture adequate volume Performed at [place] [address] [place], [place] [address]    Culture   Final    NO GROWTH 3 DAYS Performed at [facility], [place] [address], [place], [place] [address]    Report Status PENDING  Incomplete  Culture, blood (Routine X 2) w Reflex to ID Panel     Status: None (Preliminary result)   Collection Time: 03/08/23  5:54 PM   Specimen: BLOOD  Result Value Ref Range Status   Specimen Description   Final    BLOOD BLOOD RIGHT HAND AEROBIC BOTTLE ONLY Performed at [place] [address], [place], [place] [address]    Special Requests   Final    BOTTLES DRAWN AEROBIC ONLY Blood Culture adequate volume Performed at [place] [address], [place], [place] [address]    Culture   Final    NO GROWTH 3 DAYS Performed at [facility], [place] [address], [place],  [address]    Report Status PENDING  Incomplete  Surgical pcr screen     Status: None   Collection Time: 03/10/23  2:52 AM   Specimen: Nasal Mucosa; Nasal Swab  Result Value Ref Range Status   MRSA, PCR NEGATIVE NEGATIVE Final   Staphylococcus aureus NEGATIVE NEGATIVE Final    Comment: (NOTE) The Xpert SA Assay (FDA approved for NASAL specimens in patients 33 years of age and older), is one component of a comprehensive surveillance program. It is not intended to diagnose infection nor to guide or monitor treatment. Performed at [facility], [place] [address], [place], [place] [address]   Aerobic/Anaerobic Culture w Gram Stain (surgical/deep wound)     Status: None (Preliminary result)   Collection Time: 03/10/23  2:34 PM   Specimen: PATH Soft tissue  Result  Value Ref Range Status   Specimen Description   Final    TISSUE Performed at [place] [address] [place], [place] [address]    Special Requests   Final    LEFT KNEE Performed at [facility], [place] [address] [place], [place] [address]    Gram Stain   Final    NO WBC SEEN NO ORGANISMS SEEN Performed at [facility], [place] [address] [place], [place] [address]    Culture PENDING  Incomplete   Report Status PENDING  Incomplete     Assessment/Plan: 1 Day Post-Op   Principal Problem:   Surgical wound dehiscence  Labs pending difficulty with blood draw. Will continue to monitor.   MSSA from synovasure office cultures. Continue to follow intraoperative cultures.  Blood cultures negative to date.   She may touchdown weight-bear left lower extremity in the knee immobilizer.  DVT ppx: Xarelto, SCDs, TEDS PO pain control PT/OT Dispo:  - Continue IV Ancef for now. Follow intraoperative culture. We will order placement of a PICC line.  - Infectious disease consulted for antibiotic selection and duration.  - Hemovac drain may be discontinued when drainage is less than 30 cc per shift.  - JP dressing may be discontinued when drainage is less than 10 cc per shift.  - Upon discharge, her house VAC suction unit will be exchanged for portable Prevena suction unit.  - She will follow-up in the office within 7 days of discharge for removal of her negative pressure dressing. Her sutures should be retained for approximately 3 weeks.   Charlott Rakes, PA-C 03/11/2023, 7:20 AM   [facility]  [place] [address], [address], [place], [place] [address] Phone: [phone] [email or web address] Facebook  Instagram  LinkedIn  Twitter       ADDENDUM: Albumin noted to be low at 2.2 Will obtain nutrition consult to optimize wound healing. Started her on PO zinc and vitamin c. Prealbumin ordered for the am.  Bertram Savin, MD 03/11/23 5:44 PM

## 2023-03-11 NOTE — Evaluation (Signed)
Physical Therapy Evaluation Patient Details Name: Norma Anthony MRN: [id number] DOB: Jun 02, 1956 Today's Date: 03/11/2023  History of Present Illness  Pt s/p resectioon of L TKR with antibiotic spacer placement.  Pt with hx of COPD, TIA, and DM  Clinical Impression  Pt admitted as above and presenting with functional mobility limitations 2* decreased L LE strength/ROM, post op pain, TWB, elevated anxiety level and body habitus.  This date, pt limited to supine<>sit transfer with extensive assist of 2 and sitting EOB.  Pt would benefit from significant follow up post acute PT to maximize IND and safety.     Recommendations for follow up therapy are one component of a multi-disciplinary discharge planning process, led by the attending physician.  Recommendations may be updated based on patient status, additional functional criteria and insurance authorization.  Follow Up Recommendations Can patient physically be transported by private vehicle: No     Assistance Recommended at Discharge Frequent or constant Supervision/Assistance  Patient can return home with the following  Two people to help with walking and/or transfers;A lot of help with bathing/dressing/bathroom;Assistance with cooking/housework;Assist for transportation;Help with stairs or ramp for entrance    Equipment Recommendations None recommended by PT  Recommendations for Other Services  OT consult    Functional Status Assessment Patient has had a recent decline in their functional status and demonstrates the ability to make significant improvements in function in a reasonable and predictable amount of time.     Precautions / Restrictions Precautions Precautions: Fall;Knee;Other (comment) Precaution Comments: wound vac Required Braces or Orthoses: Knee Immobilizer - Left Knee Immobilizer - Left: On at all times Restrictions Weight Bearing Restrictions: Yes LLE Weight Bearing: Touchdown weight bearing      Mobility  Bed  Mobility Overal bed mobility: Needs Assistance Bed Mobility: Supine to Sit, Sit to Supine     Supine to sit: Mod assist, +2 for physical assistance, +2 for safety/equipment Sit to supine: Mod assist, +2 for physical assistance, +2 for safety/equipment   General bed mobility comments: cues for sequence and use of R LE to self assist.  Use of bedrail and physical assist to manage L LE, to control trunk and to complete rotation to/from EOB    Transfers                   General transfer comment: Deferred 2* pt pain level and elevated anxiety    Ambulation/Gait                  Stairs            Wheelchair Mobility    Modified Rankin (Stroke Patients Only)       Balance Overall balance assessment: Needs assistance Sitting-balance support: No upper extremity supported Sitting balance-Leahy Scale: Good                                       Pertinent Vitals/Pain Pain Assessment Pain Assessment: 0-10 Pain Score: 8  Pain Location: L knee Pain Descriptors / Indicators: Grimacing, Guarding, Aching, Sore Pain Intervention(s): Limited activity within patient's tolerance, Monitored during session, Premedicated before session, Ice applied, Patient requesting pain meds-RN notified    Home Living Family/patient expects to be discharged to:: Unsure Living Arrangements: Spouse/significant other Available Help at Discharge: Family;Available 24 hours/day Type of Home: House Home Access: Stairs to enter Entrance Stairs-Rails: None Entrance Stairs-Number of Steps: 2   Home Layout:  One level Home Equipment: Conservation officer, nature (2 wheels);Cane - single point;Wheelchair - Water quality scientist (4 wheels);BSC/3in1      Prior Function Prior Level of Function : Independent/Modified Independent;History of Falls (last six months)             Mobility Comments: Hydrographic surveyor, drives--prior to TKA; endorses multiple falls       Hand Dominance         Extremity/Trunk Assessment   Upper Extremity Assessment Upper Extremity Assessment: Defer to OT evaluation    Lower Extremity Assessment Lower Extremity Assessment: Difficult to assess due to impaired cognition LLE: Unable to fully assess due to immobilization       Communication   Communication: No difficulties  Cognition Arousal/Alertness: Awake/alert Behavior During Therapy: WFL for tasks assessed/performed Overall Cognitive Status: Within Functional Limits for tasks assessed                                 General Comments: fearful of falling, verbal reinforcement needed and relaxation techniques used.        General Comments      Exercises Total Joint Exercises Ankle Circles/Pumps: AROM, Strengthening, Both, 10 reps, Supine   Assessment/Plan    PT Assessment Patient needs continued PT services  PT Problem List Decreased strength;Decreased balance;Pain;Decreased mobility;Decreased activity tolerance       PT Treatment Interventions DME instruction;Functional mobility training;Balance training;Patient/family education;Gait training;Therapeutic activities;Stair training;Therapeutic exercise    PT Goals (Current goals can be found in the Care Plan section)  Acute Rehab PT Goals Patient Stated Goal: to return home PT Goal Formulation: With patient Time For Goal Achievement: 02/11/23 Potential to Achieve Goals: Good    Frequency 7X/week     Co-evaluation PT/OT/SLP Co-Evaluation/Treatment: Yes Reason for Co-Treatment: For patient/therapist safety PT goals addressed during session: Mobility/safety with mobility OT goals addressed during session: ADL's and self-care       AM-PAC PT "6 Clicks" Mobility  Outcome Measure Help needed turning from your back to your side while in a flat bed without using bedrails?: A Lot Help needed moving from lying on your back to sitting on the side of a flat bed without using bedrails?: A Lot Help needed moving  to and from a bed to a chair (including a wheelchair)?: Total Help needed standing up from a chair using your arms (e.g., wheelchair or bedside chair)?: Total Help needed to walk in hospital room?: Total Help needed climbing 3-5 steps with a railing? : Total 6 Click Score: 8    End of Session Equipment Utilized During Treatment: Gait belt;Left knee immobilizer Activity Tolerance: Patient limited by pain;Patient limited by fatigue Patient left: in bed;with call bell/phone within reach;with family/visitor present;with bed alarm set Nurse Communication: Mobility status;Patient requests pain meds PT Visit Diagnosis: History of falling (Z91.81);Difficulty in walking, not elsewhere classified (R26.2)    Time: [id number] PT Time Calculation (min) (ACUTE ONLY): 34 min   Charges:   PT Evaluation $PT Eval Low Complexity: 1 Low          [place] Pager [phone] Office [phone]   Gavon Majano 03/11/2023, 10:15 AM

## 2023-03-11 NOTE — Consult Note (Signed)
[facility] for Infectious Disease    Date of Admission:  03/08/2023     Reason for Consult: Left knee PJI     Referring Physician: Dr Lyla Glassing  Current antibiotics: Cefazolin    ASSESSMENT:    67 y.o. female admitted with:  Left knee MSSA PJI: Status post I&D, resection arthoplasty, and placement of antibiotic spacer 03/10/23.  Her outpatient aspiration cultures grew MSSA and OR cultures are pending.     RECOMMENDATIONS:    Continue Ancef 2 g every 8 hours Primary team notes indicate they have ordered a PICC line.  This should be okay given that her blood cultures are negative at 3 days See OPAT note below Will follow cultures from a distance and adjust antibiotics as needed but will not continue to formally see while inpatient  Diagnosis: Left knee PJI  Culture Result: MSSA  Allergies  Allergen Reactions   Lyrica [Pregabalin] Other (See Comments)    Paralysis present (finding)- not tolerated Pt stated she could not move her body but she could think      Belbuca [Buprenorphine Hcl] Other (See Comments)    " Horrible headache"    Compazine [Prochlorperazine] Anxiety   Droperidol Anxiety   Ergotamine-Caffeine Anxiety and Hypertension    Increased systolic arterial pressure (finding)     OPAT Orders Discharge antibiotics to be given via PICC line Discharge antibiotics: Per pharmacy protocol  Cefazolin 2 gm q8h  Duration: 6 weeks  End Date: 04/21/2023  [facility] Care Per Protocol:  Home health RN for IV administration and teaching; PICC line care and labs.    Labs weekly while on IV antibiotics: _xxx_ CBC with differential _xxx_ BMP __ CMP xxx__ CRP xxx__ ESR __ Vancomycin trough __ CK  _xxx_ Please pull PIC at completion of IV antibiotics __ Please leave PIC in place until doctor has seen patient or been notified  Fax weekly labs to [phone]  Clinic Follow Up Appt: 04/07/23 at 10:30am with Juleen China    Principal Problem:   Surgical  wound dehiscence   MEDICATIONS:    Scheduled Meds:  docusate sodium  100 mg Oral BID   DULoxetine  60 mg Oral QHS   gabapentin  400 mg Oral TID   melatonin  10 mg Oral QHS   oxyCODONE       rivaroxaban  10 mg Oral Q breakfast   senna  1 tablet Oral BID   Continuous Infusions:  sodium chloride Stopped (03/10/23 1130)    ceFAZolin (ANCEF) IV 2 g (03/11/23 0541)   methocarbamol (ROBAXIN) IV     PRN Meds:.albuterol, ALPRAZolam, alum & mag hydroxide-simeth, diphenhydrAMINE, HYDROmorphone (DILAUDID) injection, menthol-cetylpyridinium **OR** phenol, methocarbamol **OR** methocarbamol (ROBAXIN) IV, metoCLOPramide **OR** metoCLOPramide (REGLAN) injection, naloxone, ondansetron **OR** ondansetron (ZOFRAN) IV, oxyCODONE, oxyCODONE, oxyCODONE, polyethylene glycol  HPI:    Ladeidra Sandino is a 67 y.o. female with PMHx as noted below admitted 03/08/23 with left knee PJI and compromised skin integrity.  Patient initially had left knee arthroplasty by Dr Tonita Cong in November 2023.  She had a fall and sustained dehiscence of her incision in late December 2023.  Dr Stann Mainland took her to the OR for debridement and closure.  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  Dr Tonita Cong did an infectious work up that was negative.  She was sent to Dr Lyla Glassing for second opinion.  She went to OR 02/03/23 for repair of the  arthrotomy.  Last week she presented to the office with a subcutaneous fluid collection.  This was aspirated and cultured.  Doxycycline was initiated.  She returned to office on 03/08/23 with multiple small areas of incisional dehiscence draining purulent material.  Her cultures grew MSSA.  She was also having malaise and fevers.  She was admitted to [facility] and started on Ancef that day.  She went to the OR yesterday 03/10/23 with Dr Lyla Glassing for I&D of left knee with resection of total knee arthroplasty and placement  of antibiotic spacer.  Her blood cultures are no growth and left knee operative cultures NGTD.    Past Medical History:  Diagnosis Date   Anemia    Anxiety    Arthritis    CAD (coronary artery disease)    COPD (chronic obstructive pulmonary disease) (HCC)    Depression    Dyspnea    Fibromyalgia    GERD (gastroesophageal reflux disease)    Hiatal hernia    History of kidney stones    Migraine headache    Multinodular goiter    Nephrolithiasis    Pneumonia    Pulmonary embolism ([place]) 1996   Sleep apnea    Thyroid nodule     Social History   Tobacco Use   Smoking status: Former    Packs/day: 0.50    Years: 10.00    Additional pack years: 0.00    Total pack years: 5.00    Types: Cigarettes    Quit date: 02/12/2021    Years since quitting: 2.0   Smokeless tobacco: Never  Vaping Use   Vaping Use: Never used  Substance Use Topics   Alcohol use: Not Currently   Drug use: Not Currently    Family History  Problem Relation Age of Onset   Hypertension Mother     Allergies  Allergen Reactions   Lyrica [Pregabalin] Other (See Comments)    Paralysis present (finding)- not tolerated Pt stated she could not move her body but she could think      Belbuca [Buprenorphine Hcl] Other (See Comments)    " Horrible headache"    Compazine [Prochlorperazine] Anxiety   Droperidol Anxiety   Ergotamine-Caffeine Anxiety and Hypertension    Increased systolic arterial pressure (finding)     Review of Systems  All other systems reviewed and are negative.   OBJECTIVE:   Blood pressure (!) 149/69, pulse 94, temperature 98.6 F (37 C), temperature source Oral, resp. rate 18, height 5\' 5"  (1.651 m), weight 110.2 kg, SpO2 90 %. Body mass index is 40.43 kg/m.  Physical Exam Constitutional:      Appearance: Normal appearance.  HENT:     Head: Normocephalic and atraumatic.  Eyes:     Extraocular Movements: Extraocular movements intact.     Conjunctiva/sclera: Conjunctivae  normal.  Cardiovascular:     Pulses: Normal pulses.  Pulmonary:     Effort: Pulmonary effort is normal. No respiratory distress.  Abdominal:     General: There is no distension.     Palpations: Abdomen is soft.  Musculoskeletal:     Cervical back: Normal range of motion and neck supple.     Comments: Left leg is in an immobilizer brace.   Skin:    General: Skin is warm and dry.  Neurological:     General: No focal deficit present.     Mental Status: She is alert and oriented to person, place, and time.  Psychiatric:        Mood  and Affect: Mood normal.        Behavior: Behavior normal.      Lab Results: Lab Results  Component Value Date   WBC 8.8 03/10/2023   HGB 8.8 (L) 03/10/2023   HCT 29.2 (L) 03/10/2023   MCV 99.0 03/10/2023   PLT 328 03/10/2023    Lab Results  Component Value Date   NA 142 03/10/2023   K 3.4 (L) 03/10/2023   CO2 26 03/10/2023   GLUCOSE 92 03/10/2023   BUN 7 (L) 03/10/2023   CREATININE 0.64 03/10/2023   CALCIUM 8.6 (L) 03/10/2023   GFRNONAA >60 03/10/2023    Lab Results  Component Value Date   ALT 12 03/10/2023   AST 19 03/10/2023   ALKPHOS 47 03/10/2023   BILITOT 0.4 03/10/2023       Component Value Date/Time   CRP 11.6 (H) 03/10/2023 0813       Component Value Date/Time   ESRSEDRATE 135 (H) 03/10/2023 0813    I have reviewed the micro and lab results in Epic.  Imaging: DG Knee Left Port  Result Date: 03/10/2023 CLINICAL DATA:  Status post incision and drainage of the left knee. EXAM: PORTABLE LEFT KNEE - 1-2 VIEW COMPARISON:  Knee radiographs dated 02/03/2023. FINDINGS: The patient is status post a knee arthrodesis with two intramedullary nails and radiopaque fixation material across the knee joint. A drain overlies the operative site. Soft tissue gas is seen. IMPRESSION: Status post knee arthrodesis with two intramedullary nails. Electronically Signed   By: Zerita Boers M.D.   On: 03/10/2023 18:03     Imaging independently  reviewed in Epic.  [facility] [phone] pager 03/11/2023, 6:07 AM  I have personally spent 60 minutes involved in face-to-face and non-face-to-face activities for this patient on the day of the visit. Professional time spent includes the following activities: Preparing to see the patient (review of tests), Obtaining and/or reviewing separately obtained history (admission/discharge record), Performing a medically appropriate examination and/or evaluation , Ordering medications/tests/procedures, referring and communicating with other health care professionals, Documenting clinical information in the EMR, Independently interpreting results (not separately reported), Communicating results to the patient/family/caregiver, Counseling and educating the patient/family/caregiver and Care coordination (not separately reported).   Primary team has

## 2023-03-11 NOTE — Progress Notes (Signed)
Peripherally Inserted Central Catheter Placement  The IV Nurse has discussed with the patient and/or persons authorized to consent for the patient, the purpose of this procedure and the potential benefits and risks involved with this procedure.  The benefits include less needle sticks, lab draws from the catheter, and the patient may be discharged home with the catheter. Risks include, but not limited to, infection, bleeding, blood clot (thrombus formation), and puncture of an artery; nerve damage and irregular heartbeat and possibility to perform a PICC exchange if needed/ordered by physician.  Alternatives to this procedure were also discussed.  Bard Power PICC patient education guide, fact sheet on infection prevention and patient information card has been provided to patient /or left at bedside.    PICC Placement Documentation  PICC Single Lumen 03/11/23 Right Brachial 37 cm 0 cm (Active)  Indication for Insertion or Continuance of Line Prolonged intravenous therapies 03/11/23 1600  Exposed Catheter (cm) 0 cm 03/11/23 1600  Site Assessment Clean, Dry, Intact 03/11/23 1600  Line Status Flushed;Saline locked;Blood return noted 03/11/23 1600  Dressing Type Transparent;Securing device 03/11/23 1600  Dressing Status Antimicrobial disc in place 03/11/23 1600  Safety Lock Not Applicable [id number]  Line Care Connections checked and tightened 03/11/23 1600  Line Adjustment (NICU/IV Team Only) No 03/11/23 1600  Dressing Intervention New dressing 03/11/23 1600  Dressing Change Due 03/18/23 03/11/23 1600       Mickel Baas  Kahmari Koller 03/11/2023, 4:30 PM

## 2023-03-12 LAB — CBC
HCT: 27.4 % — ABNORMAL LOW (ref 36.0–46.0)
Hemoglobin: 8.4 g/dL — ABNORMAL LOW (ref 12.0–15.0)
MCH: 29.4 pg (ref 26.0–34.0)
MCHC: 30.7 g/dL (ref 30.0–36.0)
MCV: 95.8 fL (ref 80.0–100.0)
Platelets: 357 10*3/uL (ref 150–400)
RBC: 2.86 MIL/uL — ABNORMAL LOW (ref 3.87–5.11)
RDW: 15.7 % — ABNORMAL HIGH (ref 11.5–15.5)
WBC: 14.7 10*3/uL — ABNORMAL HIGH (ref 4.0–10.5)
nRBC: 0 % (ref 0.0–0.2)

## 2023-03-12 LAB — PREALBUMIN: Prealbumin: 10 mg/dL — ABNORMAL LOW (ref 18–38)

## 2023-03-12 MED ORDER — ENSURE MAX PROTEIN PO LIQD
11.0000 [oz_av] | Freq: Two times a day (BID) | ORAL | Status: DC
  Administered 2023-03-12 – 2023-03-15 (×7): 11 [oz_av] via ORAL
  Filled 2023-03-12 (×9): qty 330

## 2023-03-12 MED ORDER — ADULT MULTIVITAMIN W/MINERALS CH
1.0000 | ORAL_TABLET | Freq: Every day | ORAL | Status: DC
  Administered 2023-03-12 – 2023-03-16 (×5): 1 via ORAL
  Filled 2023-03-12 (×5): qty 1

## 2023-03-12 MED ORDER — JUVEN PO PACK
1.0000 | PACK | Freq: Two times a day (BID) | ORAL | Status: DC
  Administered 2023-03-13 – 2023-03-16 (×5): 1 via ORAL
  Filled 2023-03-12 (×9): qty 1

## 2023-03-12 NOTE — Progress Notes (Addendum)
Subjective: 2 Days Post-Op Procedure(s) (LRB): Irrigation and debridement left knee, resection total knee arthroplasty, antibiotic spacer placement (Left) Patient seen in rounds for Dr. Lyla Glassing. Patient is well, and has had no acute complaints or problems. Denies SOB or chest pain. Denies calf pain. Patient reports pain as  moderate to severe .  Objective: Vital signs in last 24 hours: Temp:  [97.9 F (36.6 C)-99.2 F (37.3 C)] 97.9 F (36.6 C) (03/29 0519) Pulse Rate:  [98-107] 98 (03/29 0519) Resp:  [16-18] 17 (03/29 0519) BP: (146-160)/(73-77) 159/77 (03/29 0519) SpO2:  [92 %-96 %] 96 % (03/29 0519)  Intake/Output from previous day:  Intake/Output Summary (Last 24 hours) at 03/12/2023 0740 Last data filed at 03/12/2023 0600 Gross per 24 hour  Intake 1709.41 ml  Output 840 ml  Net 869.41 ml    Intake/Output this shift: No intake/output data recorded.  Labs: Recent Labs    03/10/23 0733 03/11/23 0640 03/12/23 0422  HGB 8.8* 9.3* 8.4*   Recent Labs    03/11/23 0640 03/12/23 0422  WBC 19.7* 14.7*  RBC 3.10* 2.86*  HCT 28.7* 27.4*  PLT 375 357   Recent Labs    03/10/23 0412 03/11/23 0640  NA 142 136  K 3.4* 4.4  CL 107 102  CO2 26 27  BUN 7* 8  CREATININE 0.64 0.56  GLUCOSE 92 145*  CALCIUM 8.6* 7.7*   Recent Labs    03/10/23 0412  INR 1.3*    Exam: General - Patient is Alert and Oriented Extremity - Neurologically intact Neurovascular intact Sensation intact distally Dorsiflexion/Plantar flexion intact Negative Pressure dressing - on house vac suction unit 75 mmHg. No leaks this morning. JP drain and hemovac in place with little output this morning. Motor Function - intact, moving foot and toes well on exam.  Past Medical History:  Diagnosis Date   Anemia    Anxiety    Arthritis    CAD (coronary artery disease)    COPD (chronic obstructive pulmonary disease) (HCC)    Depression    Dyspnea    Fibromyalgia    GERD (gastroesophageal  reflux disease)    Hiatal hernia    History of kidney stones    Migraine headache    Multinodular goiter    Nephrolithiasis    Pneumonia    Pulmonary embolism ([place]) 1996   Sleep apnea    Thyroid nodule     Assessment/Plan: 2 Days Post-Op Procedure(s) (LRB): Irrigation and debridement left knee, resection total knee arthroplasty, antibiotic spacer placement (Left) Principal Problem:   Surgical wound dehiscence Active Problems:   Infected prosthetic knee joint (HCC)  Estimated body mass index is 40.43 kg/m as calculated from the following:   Height as of this encounter: 5\' 5"  (1.651 m).   Weight as of this encounter: 110.2 kg.  Labs pending difficulty with blood draw. Will continue to monitor.    MSSA from synovasure office cultures. Continue to follow intraoperative cultures.  Blood cultures negative to date.  TTWB with walker and knee immobilizer at all times  DVT ppx: Xarelto, SCDs, TEDS PO pain control PT/OT Dispo:  - Plan for SNF after hospital stay. Appreciate social works assistance in this. - PICC line placed yesterday. - Infectious disease consulted for antibiotic selection and duration.  - Hemovac drain may be discontinued when drainage is less than 30 cc per shift.  - JP dressing may be discontinued when drainage is less than 10 cc per shift.  - Upon discharge, her house  VAC suction unit will be exchanged for portable Prevena suction unit.  - She will follow-up in the office within 7 days of discharge for removal of her negative pressure dressing. Her sutures should be retained for approximately 3 weeks.   Rainey Pines, PA-C Orthopedic Surgery 03/12/2023, 7:40 AM

## 2023-03-12 NOTE — Care Management Important Message (Signed)
Important Message  Patient Details IM Letter given. Name: Norma Anthony MRN: [id number] Date of Birth: 03/10/1956   Medicare Important Message Given:  Yes     Kerin Salen 03/12/2023, 12:29 PM

## 2023-03-12 NOTE — Progress Notes (Signed)
Noticed that pts hemovac was not staying charged even after emptying, Found that hemovac had a small tear at the Y port that was causing it to leak out of the tubing, insertion site was still intact with no drainage. Was able to get a replacement hemovac and replaced it at the Y connection and seems to be holding charge now. Will continue to monitor.

## 2023-03-12 NOTE — Plan of Care (Signed)
Problem: Clinical Measurements: Goal: Ability to maintain clinical measurements within normal limits will improve Outcome: Progressing   Problem: Activity: Goal: Risk for activity intolerance will decrease Outcome: Progressing   Problem: Pain Managment: Goal: General experience of comfort will improve Outcome: [place], RN 03/12/23 8:27 PM

## 2023-03-12 NOTE — Progress Notes (Signed)
Initial Nutrition Assessment  DOCUMENTATION CODES:   Morbid obesity  INTERVENTION:  - Regular diet.  - Encourage intake at all meals of protein rich food sources in addition to fresh fruits and vegetables.   -1 packet Juven BID, each packet provides 95 calories, 2.5 grams of protein (collagen), and 9.8 grams of carbohydrate (3 grams sugar); also contains 7 grams of L-arginine and L-glutamine, 300 mg vitamin C, 15 mg vitamin E, 1.2 mcg vitamin B-12, 9.5 mg zinc, 200 mg calcium, and 1.5 g  Calcium Beta-hydroxy-Beta-methylbutyrate to support wound healing - Ensure Max po BID, each supplement provides 150 kcal and 30 grams of protein.   - Diet education with handout provided for wound healing nutrition therapy.  - Multivitamin with minerals daily to provide additional micronutrients for wound healing.  - Continue 1000mg  vitamin C and 220mg  zinc to support wound healing.    NUTRITION DIAGNOSIS:   Increased nutrient needs related to acute illness as evidenced by estimated needs.  GOAL:   Patient will meet greater than or equal to 90% of their needs  MONITOR:   PO intake, Supplement acceptance  REASON FOR ASSESSMENT:   Consult Wound healing  ASSESSMENT:   67 y.o. female with PMH COPD, CAD, fibromyalgia, and depression who had initial L knee arthroplasty in November [id number] complicated by dehiscence and several repairs who presented 03/08/23 with left total knee infection.   Patient endorses UBW of 230# and denied recent changes in weight. Per EMR, weight stable the past year.   Typically eats 3 meals a day at home. Admits her appetite has been decreased about 1 week and as a result has been eating less.   Current appetite remains decreased. Had 30% of dinner last night.   RD consulted for nutrition education regarding wound healing.  Provided diet education and handout for wound healing nutrition therapy.  Reviewed sources of protein rich food sources and discussed importance of  protein at each meal in addition to fresh fruits and vegetables for vitamins and minerals. Patient endorses liking most protein food sources including diary.Teach back method used. Expect fair compliance.   Patient hopes her appetite will improve soon. Of note, she has had not a documented bowel movement since 3/22 which could be affecting appetite. Consider increasing bowel regimen.   She is agreeable to receive nutrition supplements to support healing.    Medications reviewed and include: 1000mg  vitamin C, 220mg  zinc, Colace, Senokot  Labs reviewed:  -   NUTRITION - FOCUSED PHYSICAL EXAM:  No deletions noted  Diet Order:   Diet Order             Diet regular Room service appropriate? Yes; Fluid consistency: Thin  Diet effective now                   EDUCATION NEEDS:  Education needs have been addressed  Skin:  Skin Assessment: Skin Integrity Issues: Skin Integrity Issues:: Incisions, Other (Comment) Incisions: L knee Other: Dehiscence of L knee incision  Last BM:  3/22  Height:  Ht Readings from Last 1 Encounters:  03/08/23 5\' 5"  (1.651 m)   Weight:  Wt Readings from Last 1 Encounters:  03/10/23 110.2 kg   BMI:  Body mass index is 40.43 kg/m.  Estimated Nutritional Needs:  Kcal:  2000-2200 kcals Protein:  120-140 grams Fluid:  >/= 2L    Samson Frederic RD, LDN For contact information, refer to [facility].

## 2023-03-12 NOTE — Progress Notes (Signed)
Physical Therapy Treatment Patient Details Name: Norma Anthony MRN: [id number] DOB: 09-01-1956 Today's Date: 03/12/2023   History of Present Illness Pt s/p resectioon of L TKR with antibiotic spacer placement.  Pt with hx of COPD, TIA, and DM    PT Comments    Pt continues cooperative but with elevated anxiety and requiring encouragement to participate with PT.  With increased time, pt up to EOB sitting and tolerated increased time vs yesterday but still defers attempts to stand.  Will follow in am.  Recommendations for follow up therapy are one component of a multi-disciplinary discharge planning process, led by the attending physician.  Recommendations may be updated based on patient status, additional functional criteria and insurance authorization.  Follow Up Recommendations  Can patient physically be transported by private vehicle: No    Assistance Recommended at Discharge Frequent or constant Supervision/Assistance  Patient can return home with the following Two people to help with walking and/or transfers;A lot of help with bathing/dressing/bathroom;Assistance with cooking/housework;Assist for transportation;Help with stairs or ramp for entrance   Equipment Recommendations  None recommended by PT    Recommendations for Other Services OT consult     Precautions / Restrictions Precautions Precautions: Fall;Knee;Other (comment) Precaution Comments: wound vac, JP and hemovacs Required Braces or Orthoses: Knee Immobilizer - Left Knee Immobilizer - Left: On at all times Restrictions Weight Bearing Restrictions: Yes LLE Weight Bearing: Touchdown weight bearing     Mobility  Bed Mobility Overal bed mobility: Needs Assistance Bed Mobility: Supine to Sit, Sit to Supine     Supine to sit: Mod assist, +2 for physical assistance, +2 for safety/equipment Sit to supine: Mod assist, +2 for physical assistance, +2 for safety/equipment   General bed mobility comments: Increased time  with cues for sequence and use of R LE to self assist.  Use of bedrail and physical assist to manage L LE, to control trunk and to complete rotation to/from EOB    Transfers                   General transfer comment: Deferred 2* pt pain level and elevated anxiety    Ambulation/Gait                   Stairs             Wheelchair Mobility    Modified Rankin (Stroke Patients Only)       Balance Overall balance assessment: Needs assistance Sitting-balance support: No upper extremity supported, Feet supported Sitting balance-Leahy Scale: Good                                      Cognition Arousal/Alertness: Awake/alert Behavior During Therapy: WFL for tasks assessed/performed Overall Cognitive Status: Within Functional Limits for tasks assessed                                 General Comments: fearful of falling, verbal reinforcement needed and relaxation techniques used.        Exercises Total Joint Exercises Ankle Circles/Pumps: AROM, Strengthening, Both, 10 reps, Supine    General Comments        Pertinent Vitals/Pain Pain Assessment Pain Assessment: 0-10 Pain Score: 8  Pain Location: L knee Pain Descriptors / Indicators: Grimacing, Guarding, Aching, Sore Pain Intervention(s): Limited activity within patient's tolerance, Monitored during session, Premedicated before  session, Ice applied    Home Living                          Prior Function            PT Goals (current goals can now be found in the care plan section) Acute Rehab PT Goals Patient Stated Goal: to return home PT Goal Formulation: With patient Time For Goal Achievement: 02/11/23 Potential to Achieve Goals: Good Progress towards PT goals: Progressing toward goals    Frequency    7X/week      PT Plan Current plan remains appropriate    Co-evaluation              AM-PAC PT "6 Clicks" Mobility   Outcome  Measure  Help needed turning from your back to your side while in a flat bed without using bedrails?: A Lot Help needed moving from lying on your back to sitting on the side of a flat bed without using bedrails?: A Lot Help needed moving to and from a bed to a chair (including a wheelchair)?: Total Help needed standing up from a chair using your arms (e.g., wheelchair or bedside chair)?: Total Help needed to walk in hospital room?: Total Help needed climbing 3-5 steps with a railing? : Total 6 Click Score: 8    End of Session Equipment Utilized During Treatment: Gait belt;Left knee immobilizer Activity Tolerance: Patient limited by pain;Patient limited by fatigue;Other (comment) (anxiety) Patient left: in bed;with call bell/phone within reach;with bed alarm set Nurse Communication: Mobility status;Patient requests pain meds PT Visit Diagnosis: History of falling (Z91.81);Difficulty in walking, not elsewhere classified (R26.2)     Time: [id number] PT Time Calculation (min) (ACUTE ONLY): 45 min  Charges:  $Therapeutic Activity: 23-37 mins                     Debe Coder PT Acute Rehabilitation Services Pager [phone] Office [phone]    [facility] 03/12/2023, 4:46 PM

## 2023-03-12 NOTE — TOC Progression Note (Signed)
Transition of Care [facility]) - Progression Note    Patient Details  Name: Norma Anthony MRN: [id number] Date of Birth: 10-20-1956  [facility] CM/SW Contact  Lennart Pall, LCSW Phone Number: 03/12/2023, 2:02 PM  Clinical Narrative:     Met with pt and spouse today to review SNF bed offers and they have accepted bed at [facility] who can admit pt on Monday in order to secure needed antibiotics.  Will begin insurance authorization.  Have alerted treatment team.  Expected Discharge Plan: Skilled Nursing Facility Barriers to Discharge: Continued Medical Work up, SNF Pending bed offer, Ship broker  Expected Discharge Plan and Services In-house Referral: Clinical Social Work   Post Acute Care Choice: [place] Living arrangements for the past 2 months: Single Family Home                 DME Arranged: N/A DME Agency: NA                   Social Determinants of Health (SDOH) Interventions SDOH Screenings   Food Insecurity: No Food Insecurity (03/08/2023)  Housing: Low Risk  (03/08/2023)  Transportation Needs: No Transportation Needs (03/08/2023)  Utilities: Not At Risk (03/08/2023)  Tobacco Use: Medium Risk (03/11/2023)    Readmission Risk Interventions    03/11/2023    2:01 PM 11/06/2022    9:37 AM  Readmission Risk Prevention Plan  Transportation Screening Complete Complete  PCP or Specialist Appt within 3-5 Days  Complete  HRI or [place]  Complete  Social Work Consult for [place] Planning/Counseling  Complete  Palliative Care Screening  Not Applicable  Medication Review Press photographer) Complete Complete  PCP or Specialist appointment within 3-5 days of discharge Complete   HRI or [place] Complete   SW Recovery Care/Counseling Consult Complete   Palliative Care Screening Not Applicable   Skilled Nursing Facility Complete

## 2023-03-12 NOTE — Discharge Instructions (Signed)
Dr. Rod Can Total Joint Specialist [facility] [address] [place], [place] [address] [phone]  TOTAL KNEE REPLACEMENT POSTOPERATIVE DIRECTIONS    Knee Rehabilitation, Guidelines Following Surgery  Results after knee surgery are often greatly improved when you follow the exercise, range of motion and muscle strengthening exercises prescribed by your doctor. Safety measures are also important to protect the knee from further injury. Any time any of these exercises cause you to have increased pain or swelling in your knee joint, decrease the amount until you are comfortable again and slowly increase them. If you have problems or questions, call your caregiver or physical therapist for advice.   WEIGHT BEARING Toe touch weight bearing on left lower extremity with knee immobilizer and walker at all times. Do not try and bend your knee.   HOME CARE INSTRUCTIONS  Remove items at home which could result in a fall. This includes throw rugs or furniture in walking pathways.  Continue medications as instructed at time of discharge. You may have some home medications which will be placed on hold until you complete the course of blood thinner medication.  You may start showering once you are discharged home but do not submerge the incision under water. Just pat the incision dry and apply a dry gauze dressing on daily. Walk with walker as instructed.  You may resume a sexual relationship in one month or when given the OK by your doctor.  Use walker as long as suggested by your caregivers. Avoid periods of inactivity such as sitting longer than an hour when not asleep. This helps prevent blood clots.  You may return to work once you are cleared by your doctor.  Do not drive a car for 6 weeks or until released by you surgeon.  Do not drive while taking narcotics.  Wear the elastic stockings for three weeks following surgery during the day but you may remove then  at night. Make sure you keep all of your appointments after your operation with all of your doctors and caregivers. You should call the office at the above phone number and make an appointment for approximately two weeks after the date of your surgery. Do not remove your surgical dressing.  Please pick up a stool softener and laxative for home use as long as you are requiring pain medications. ICE to the affected knee every three hours for 30 minutes at a time and then as needed for pain and swelling.  Continue to use ice on the knee for pain and swelling from surgery. You may notice swelling that will progress down to the foot and ankle.  This is normal after surgery.  Elevate the leg when you are not up walking on it.   It is important for you to complete the blood thinner medication as prescribed by your doctor. Continue to use the breathing machine which will help keep your temperature down.  It is common for your temperature to cycle up and down following surgery, especially at night when you are not up moving around and exerting yourself.  The breathing machine keeps your lungs expanded and your temperature down.  RANGE OF MOTION AND STRENGTHENING EXERCISES  Rehabilitation of the knee is important following a knee injury or an operation. After just a few days of immobilization, the muscles of the thigh which control the knee become weakened and shrink (atrophy). Knee exercises are designed to build up the tone and strength of the thigh muscles and to improve  knee motion. Often times heat used for twenty to thirty minutes before working out will loosen up your tissues and help with improving the range of motion but do not use heat for the first two weeks following surgery. These exercises can be done on a training (exercise) mat, on the floor, on a table or on a bed. Use what ever works the best and is most comfortable for you Knee exercises include:  Leg Lifts - While your knee is still immobilized  in a splint or cast, you can do straight leg raises. Lift the leg to 60 degrees, hold for 3 sec, and slowly lower the leg. Repeat 10-20 times 2-3 times daily. Perform this exercise against resistance later as your knee gets better.  Quad and Hamstring Sets - Tighten up the muscle on the front of the thigh (Quad) and hold for 5-10 sec. Repeat this 10-20 times hourly. Hamstring sets are done by pushing the foot backward against an object and holding for 5-10 sec. Repeat as with quad sets.  A rehabilitation program following serious knee injuries can speed recovery and prevent re-injury in the future due to weakened muscles. Contact your doctor or a physical therapist for more information on knee rehabilitation.   POST-OPERATIVE OPIOID TAPER INSTRUCTIONS: It is important to wean off of your opioid medication as soon as possible. If you do not need pain medication after your surgery it is ok to stop day one. Opioids include: Codeine, Hydrocodone(Norco, Vicodin), Oxycodone(Percocet, oxycontin) and hydromorphone amongst others.  Long term and even short term use of opiods can cause: Increased pain response Dependence Constipation Depression Respiratory depression And more.  Withdrawal symptoms can include Flu like symptoms Nausea, vomiting And more Techniques to manage these symptoms Hydrate well Eat regular healthy meals Stay active Use relaxation techniques(deep breathing, meditating, yoga) Do Not substitute Alcohol to help with tapering If you have been on opioids for less than two weeks and do not have pain than it is ok to stop all together.  Plan to wean off of opioids This plan should start within one week post op of your joint replacement. Maintain the same interval or time between taking each dose and first decrease the dose.  Cut the total daily intake of opioids by one tablet each day Next start to increase the time between doses. The last dose that should be eliminated is the  evening dose.    SKILLED REHAB INSTRUCTIONS: If the patient is transferred to a skilled rehab facility following release from the hospital, a list of the current medications will be sent to the facility for the patient to continue.  When discharged from the skilled rehab facility, please have the facility set up the patient's [place] prior to being released. Also, the skilled facility will be responsible for providing the patient with their medications at time of release from the facility to include their pain medication, the muscle relaxants, and their blood thinner medication. If the patient is still at the rehab facility at time of the two week follow up appointment, the skilled rehab facility will also need to assist the patient in arranging follow up appointment in our office and any transportation needs.  MAKE SURE YOU:  Understand these instructions.  Will watch your condition.  Will get help right away if you are not doing well or get worse.    Pick up stool softner and laxative for home use following surgery while on pain medications. Do NOT remove your dressing.  Make sure you charge you prevena vac nightly.  Do not try and bend your knee. Wear knee immobilizer at all times when ambulating with your walker.  Toe touch weightbearing with walker.  Empty your JP drain bulb every 4 hours and record the amount of output. Please keep a log of your output from your JP drain bulb and bring with you to your first appointment.  Do not take tub baths or submerge incision under water. Continue to use ice for pain and swelling after surgery. Do not use any lotions or creams on the incision until instructed by your surgeon.

## 2023-03-13 LAB — CULTURE, BLOOD (ROUTINE X 2)
Culture: NO GROWTH
Culture: NO GROWTH
Special Requests: ADEQUATE
Special Requests: ADEQUATE

## 2023-03-13 NOTE — Plan of Care (Signed)

## 2023-03-13 NOTE — Progress Notes (Signed)
Physical Therapy Treatment Patient Details Name: Norma Anthony MRN: [id number] DOB: [birth year] Today's Date: 03/13/2023   History of Present Illness Pt s/p resectioon of L TKR with antibiotic spacer placement.  Pt with hx of COPD, TIA, and DM    PT Comments    Pt continues to require ++encouragement  to gain participation.  Pt assisted to bedside sitting and one partial attempt to stand but aborted with pt elevated anxiety and pain levels.  Pt assisted back to bed and with rolling in both directions to complete change of soiled linens.  Recommendations for follow up therapy are one component of a multi-disciplinary discharge planning process, led by the attending physician.  Recommendations may be updated based on patient status, additional functional criteria and insurance authorization.  Follow Up Recommendations  Can patient physically be transported by private vehicle: No    Assistance Recommended at Discharge Frequent or constant Supervision/Assistance  Patient can return home with the following Two people to help with walking and/or transfers;A lot of help with bathing/dressing/bathroom;Assistance with cooking/housework;Assist for transportation;Help with stairs or ramp for entrance   Equipment Recommendations  None recommended by PT    Recommendations for Other Services OT consult     Precautions / Restrictions Precautions Precautions: Fall;Knee;Other (comment) Precaution Comments: wound vac, JP and hemovacs Required Braces or Orthoses: Knee Immobilizer - Left Knee Immobilizer - Left: On at all times Restrictions Weight Bearing Restrictions: Yes LLE Weight Bearing: Touchdown weight bearing     Mobility  Bed Mobility Overal bed mobility: Needs Assistance Bed Mobility: Supine to Sit, Sit to Supine, Rolling Rolling: Mod assist, Max assist, +2 for physical assistance, +2 for safety/equipment   Supine to sit: Mod assist, +2 for physical assistance, +2 for  safety/equipment Sit to supine: Mod assist, Max assist   General bed mobility comments: Increased time with cues for sequence and use of R LE to self assist.  Use of bedrail and physical assist to manage L LE, to control trunk and to complete rotation to/from EOB Patient Response: Anxious  Transfers                   General transfer comment: Pt attempted sit to stand from elevated bed but unable to clear buttocks before sitting down with c/o increasing pain and fear of falling    Ambulation/Gait                   Stairs             Wheelchair Mobility    Modified Rankin (Stroke Patients Only)       Balance Overall balance assessment: Needs assistance Sitting-balance support: No upper extremity supported, Feet supported Sitting balance-Leahy Scale: Good                                      Cognition Arousal/Alertness: Awake/alert Behavior During Therapy: WFL for tasks assessed/performed Overall Cognitive Status: Within Functional Limits for tasks assessed                                 General Comments: fearful of falling, verbal reinforcement needed and relaxation techniques used.        Exercises      General Comments        Pertinent Vitals/Pain Pain Assessment Pain Assessment: 0-10 Pain Score: 8  Pain Location:  L knee Pain Descriptors / Indicators: Grimacing, Guarding, Aching, Sore Pain Intervention(s): Limited activity within patient's tolerance, Monitored during session, Premedicated before session, Ice applied    Home Living                          Prior Function            PT Goals (current goals can now be found in the care plan section) Acute Rehab PT Goals Patient Stated Goal: to return home PT Goal Formulation: With patient Time For Goal Achievement: 03/25/23 Potential to Achieve Goals: Good Progress towards PT goals: Not progressing toward goals - comment (ltd by  anxiety/pain)    Frequency    Min 5X/week      PT Plan Current plan remains appropriate    Co-evaluation              AM-PAC PT "6 Clicks" Mobility   Outcome Measure  Help needed turning from your back to your side while in a flat bed without using bedrails?: A Lot Help needed moving from lying on your back to sitting on the side of a flat bed without using bedrails?: A Lot Help needed moving to and from a bed to a chair (including a wheelchair)?: Total Help needed standing up from a chair using your arms (e.g., wheelchair or bedside chair)?: Total Help needed to walk in hospital room?: Total Help needed climbing 3-5 steps with a railing? : Total 6 Click Score: 8    End of Session Equipment Utilized During Treatment: Gait belt;Left knee immobilizer Activity Tolerance: Patient limited by pain;Patient limited by fatigue;Other (comment) (anxiety) Patient left: in bed;with call bell/phone within reach;with bed alarm set Nurse Communication: Mobility status;Patient requests pain meds PT Visit Diagnosis: History of falling (Z91.81);Difficulty in walking, not elsewhere classified (R26.2)     Time: [id number] PT Time Calculation (min) (ACUTE ONLY): 35 min  Charges:  $Therapeutic Activity: 23-37 mins                     [place] Pager [phone] Office [phone]    Michela Herst 03/13/2023, 11:45 AM

## 2023-03-13 NOTE — Progress Notes (Signed)
Subjective: 3 Days Post-Op Procedure(s) (LRB): Irrigation and debridement left knee, resection total knee arthroplasty, antibiotic spacer placement (Left) Patient seen in round for Dr. Lyla Glassing Patient reports pain as 9 on 0-10 scale.   Had over 650 cc of bloody output over last shift She has not gotten up with physical therapy yet  Objective: Vital signs in last 24 hours: Temp:  [97.7 F (36.5 C)-99.1 F (37.3 C)] 97.7 F (36.5 C) (03/30 0527) Pulse Rate:  [94-102] 99 (03/30 0527) Resp:  [16-18] 16 (03/30 0527) BP: (130-168)/(60-82) 130/60 (03/30 0527) SpO2:  [94 %-100 %] 94 % (03/30 0527)  Intake/Output from previous day: 03/29 0701 - 03/30 0700 In: 1232.7 [P.O.:320; I.V.:712.2; IV Piggyback:200.6] Out: 2210 [Urine:1650; Drains:560] Intake/Output this shift: No intake/output data recorded.  Recent Labs    03/11/23 0640 03/12/23 0422  HGB 9.3* 8.4*   Recent Labs    03/11/23 0640 03/12/23 0422  WBC 19.7* 14.7*  RBC 3.10* 2.86*  HCT 28.7* 27.4*  PLT 375 357   Recent Labs    03/11/23 0640  NA 136  K 4.4  CL 102  CO2 27  BUN 8  CREATININE 0.56  GLUCOSE 145*  CALCIUM 7.7*   No results for input(s): "LABPT", "INR" in the last 72 hours.  Neurologically intact Neurovascular intact Sensation intact distally Intact pulses distally Dorsiflexion/Plantar flexion intact Incision: dressing C/D/I Compartment soft   Assessment/Plan: 3 Days Post-Op Procedure(s) (LRB): Irrigation and debridement left knee, resection total knee arthroplasty, antibiotic spacer placement (Left) Up with therapy will get up with PT today, toe-touch weightbearing with knee immobilizer on at all times Will leave drains in today due to the amount of output that was produced yesterday needs to be less than 30 cc in the Hemovac and less than 10 cc in the JP drain  Drue Novel, PA-C [facility] with Dr. Theda Sers [phone] 03/13/2023, 7:55 AM

## 2023-03-13 NOTE — Plan of Care (Signed)
?  Problem: Education: ?Goal: Knowledge of General Education information will improve ?Description: Including pain rating scale, medication(s)/side effects and non-pharmacologic comfort measures ?Outcome: Progressing ?  ?Problem: Health Behavior/Discharge Planning: ?Goal: Ability to manage health-related needs will improve ?Outcome: Progressing ?  ?Problem: Coping: ?Goal: Level of anxiety will decrease ?Outcome: Progressing ?  ?

## 2023-03-14 NOTE — Progress Notes (Signed)
Subjective: 4 Days Post-Op Procedure(s) (LRB): Irrigation and debridement left knee, resection total knee arthroplasty, antibiotic spacer placement (Left)  Patient reports pain as mild to moderate.  Denies fever, chills, N/V, CP, SOB.  Tolerating POs well.  Reports that she continues have drainage into hemovac and JP drain.  Objective:   VITALS:  Temp:  [99.1 F (37.3 C)-99.4 F (37.4 C)] 99.4 F (37.4 C) (03/31 0641) Pulse Rate:  [96-100] 96 (03/31 0641) Resp:  [16] 16 (03/31 0641) BP: (143-164)/(75-94) 143/81 (03/31 0641) SpO2:  [92 %-95 %] 92 % (03/31 0641)  General: WDWN patient in NAD. Psych:  Appropriate mood and affect. Neuro:  A&O x 3, Moving all extremities, sensation intact to light touch HEENT:  EOMs intact Chest:  Even non-labored respirations Skin:  Dressing/KI C/D/I, no rashes or lesions.  Hemovac and JP drain intact.  Serosanguinous fluid in both. Extremities: warm/dry, moderate edema to L knee, no erythema or echymosis.  No lymphadenopathy. Pulses: Dorsalis pedis 2+ MSK:  ROM: TKE, MMT: able to perform quad set, (-) Homan's    LABS Recent Labs    03/12/23 0422  HGB 8.4*  WBC 14.7*  PLT 357   No results for input(s): "NA", "K", "CL", "CO2", "BUN", "CREATININE", "GLUCOSE" in the last 72 hours. No results for input(s): "LABPT", "INR" in the last 72 hours.   Assessment/Plan: 4 Days Post-Op Procedure(s) (LRB): Irrigation and debridement left knee, resection total knee arthroplasty, antibiotic spacer placement (Left)  Patient seen in rounds for Dr. Lyla Glassing. RN reports that hemovac and JP drain output was not recorded last night. Hemovac and JP drain left intact. TDWB L LE with walker Up with therapy. KI at all times. ABX per ID.  Mechele Claude PA-C [facility] Office:  [phone]

## 2023-03-14 NOTE — Plan of Care (Signed)
  Problem: Education: Goal: Knowledge of General Education information will improve Description Including pain rating scale, medication(s)/side effects and non-pharmacologic comfort measures Outcome: Progressing   Problem: Health Behavior/Discharge Planning: Goal: Ability to manage health-related needs will improve Outcome: Progressing   

## 2023-03-15 LAB — AEROBIC/ANAEROBIC CULTURE W GRAM STAIN (SURGICAL/DEEP WOUND): Gram Stain: NONE SEEN

## 2023-03-15 MED ORDER — HYDROMORPHONE HCL 2 MG PO TABS
2.0000 mg | ORAL_TABLET | ORAL | Status: DC | PRN
  Administered 2023-03-15: 2 mg via ORAL
  Administered 2023-03-15: 3 mg via ORAL
  Administered 2023-03-15 (×2): 2 mg via ORAL
  Administered 2023-03-16 (×2): 4 mg via ORAL
  Filled 2023-03-15: qty 1
  Filled 2023-03-15: qty 2
  Filled 2023-03-15: qty 1
  Filled 2023-03-15 (×2): qty 2
  Filled 2023-03-15: qty 1

## 2023-03-15 NOTE — Progress Notes (Signed)
Physical Therapy Treatment Patient Details Name: Norma Anthony MRN: [id number] DOB: 12-14-1956 Today's Date: 03/15/2023   History of Present Illness Pt s/p resection of L TKR with antibiotic spacer placement.  Pt with hx of COPD, TIA, DM, anxiety, depression, migraines, fibromyalgia    PT Comments    Pt assisted with standing and able to pivot to recliner today.  Pt premedicated for session and had change of pain meds today.  Pt appears to tolerate mobility better however never provided a numerical number to rate pain despite being directly asked.  Pt also did well maintaining TDWB status with transfer.  Pt does require repeated cues and increased time to perform tasks.     Recommendations for follow up therapy are one component of a multi-disciplinary discharge planning process, led by the attending physician.  Recommendations may be updated based on patient status, additional functional criteria and insurance authorization.  Follow Up Recommendations  Can patient physically be transported by private vehicle: No    Assistance Recommended at Discharge Frequent or constant Supervision/Assistance  Patient can return home with the following Two people to help with walking and/or transfers;A lot of help with bathing/dressing/bathroom;Assistance with cooking/housework;Assist for transportation;Help with stairs or ramp for entrance   Equipment Recommendations  None recommended by PT    Recommendations for Other Services       Precautions / Restrictions Precautions Precautions: Fall;Knee;Other (comment) Precaution Comments: wound vac, hemovac Required Braces or Orthoses: Knee Immobilizer - Left Knee Immobilizer - Left: On at all times Restrictions Weight Bearing Restrictions: Yes LLE Weight Bearing: Touchdown weight bearing     Mobility  Bed Mobility Overal bed mobility: Needs Assistance Bed Mobility: Supine to Sit     Supine to sit: Mod assist, HOB elevated, +2 for  safety/equipment     General bed mobility comments: Increased time with cues for sequence, Use of bedrail and physical assist to manage L LE, to control trunk and to complete rotation to/from EOB    Transfers Overall transfer level: Needs assistance Equipment used: Rolling walker (2 wheels) Transfers: Sit to/from Stand, Bed to chair/wheelchair/BSC Sit to Stand: Mod assist, +2 physical assistance Stand pivot transfers: Min assist, +2 safety/equipment         General transfer comment: verbal cues for UE and LE positioning, weight shifting, TDWB; pt able to maintain TDWB and scoot Rt foot along floor to transfer over to recliner with step by step instructions    Ambulation/Gait                   Stairs             Wheelchair Mobility    Modified Rankin (Stroke Patients Only)       Balance                                            Cognition Arousal/Alertness: Awake/alert Behavior During Therapy: WFL for tasks assessed/performed, Anxious Overall Cognitive Status: Within Functional Limits for tasks assessed                                          Exercises      General Comments        Pertinent Vitals/Pain Pain Assessment Pain Assessment: Faces Faces Pain Scale: Hurts little more  Pain Location: L knee Pain Descriptors / Indicators: Grimacing, Guarding, Aching, Sore Pain Intervention(s): Monitored during session, Premedicated before session, Repositioned    Home Living                          Prior Function            PT Goals (current goals can now be found in the care plan section) Progress towards PT goals: Progressing toward goals    Frequency    Min 5X/week      PT Plan Current plan remains appropriate    Co-evaluation              AM-PAC PT "6 Clicks" Mobility   Outcome Measure  Help needed turning from your back to your side while in a flat bed without using bedrails?:  A Lot Help needed moving from lying on your back to sitting on the side of a flat bed without using bedrails?: A Lot Help needed moving to and from a bed to a chair (including a wheelchair)?: A Lot Help needed standing up from a chair using your arms (e.g., wheelchair or bedside chair)?: A Lot Help needed to walk in hospital room?: Total Help needed climbing 3-5 steps with a railing? : Total 6 Click Score: 10    End of Session Equipment Utilized During Treatment: Gait belt;Left knee immobilizer Activity Tolerance: Patient tolerated treatment well Patient left: in chair;with call bell/phone within reach;with family/visitor present;with chair alarm set Nurse Communication: Mobility status PT Visit Diagnosis: History of falling (Z91.81);Difficulty in walking, not elsewhere classified (R26.2)     Time: [id number] PT Time Calculation (min) (ACUTE ONLY): 29 min  Charges:  $Therapeutic Activity: 23-37 mins                     Arlyce Dice, DPT Physical Therapist Acute Rehabilitation Services Office: [place] 03/15/2023, 1:06 PM

## 2023-03-15 NOTE — Progress Notes (Signed)
Subjective: 5 Days Post-Op Procedure(s) (LRB): Irrigation and debridement left knee, resection total knee arthroplasty, antibiotic spacer placement (Left) Patient reports pain as mild.   Patient seen in rounds for Dr. Lyla Glassing. Patient is well, and has had no acute complaints or problems. She complains of significant pain. Her husband was on the phone to ask questions this morning. They have concerns about her pain management.  We will continue therapy today.   Objective: Vital signs in last 24 hours: Temp:  [98.3 F (36.8 C)-98.7 F (37.1 C)] 98.3 F (36.8 C) (04/01 0524) Pulse Rate:  [89-100] 89 (04/01 0524) Resp:  [16-17] 16 (04/01 0524) BP: (136-142)/(64-78) 142/64 (04/01 0524) SpO2:  [93 %-100 %] 100 % (04/01 0524)  Intake/Output from previous day:  Intake/Output Summary (Last 24 hours) at 03/15/2023 0736 Last data filed at 03/15/2023 0600 Gross per 24 hour  Intake 952.61 ml  Output 1360 ml  Net -407.39 ml     Intake/Output this shift: No intake/output data recorded.  Labs: No results for input(s): "HGB" in the last 72 hours. No results for input(s): "WBC", "RBC", "HCT", "PLT" in the last 72 hours. No results for input(s): "NA", "K", "CL", "CO2", "BUN", "CREATININE", "GLUCOSE", "CALCIUM" in the last 72 hours. No results for input(s): "LABPT", "INR" in the last 72 hours.  Exam: General - Patient is Alert and Oriented Extremity - Neurologically intact Sensation intact distally Intact pulses distally Dorsiflexion/Plantar flexion intact Dressing - dressing C/D/I Motor Function - intact, moving foot and toes well on exam.   Past Medical History:  Diagnosis Date   Anemia    Anxiety    Arthritis    CAD (coronary artery disease)    COPD (chronic obstructive pulmonary disease) (HCC)    Depression    Dyspnea    Fibromyalgia    GERD (gastroesophageal reflux disease)    Hiatal hernia    History of kidney stones    Migraine headache    Multinodular goiter     Nephrolithiasis    Pneumonia    Pulmonary embolism ([place]) 1996   Sleep apnea    Thyroid nodule     Assessment/Plan: 5 Days Post-Op Procedure(s) (LRB): Irrigation and debridement left knee, resection total knee arthroplasty, antibiotic spacer placement (Left) Principal Problem:   Surgical wound dehiscence Active Problems:   Infected prosthetic knee joint  Estimated body mass index is 40.43 kg/m as calculated from the following:   Height as of this encounter: 5\' 5"  (1.651 m).   Weight as of this encounter: 110.2 kg. Advance diet Up with therapy D/C IV fluids   Patient's anticipated LOS is less than 2 midnights, meeting these requirements: - Younger than 76 - Lives within 1 hour of care - Has a competent adult at home to recover with post-op recover - NO history of  - Chronic pain requiring opiods  - Diabetes  - Coronary Artery Disease  - Heart failure  - Heart attack  - Stroke  - DVT/VTE  - Cardiac arrhythmia  - Respiratory Failure/COPD  - Renal failure  - Anemia  - Advanced Liver disease     DVT Prophylaxis - Aspirin TDWB  with KI  JP drain pulled Hemovac still with >150 cc output  Will d/c IV pain meds today Will try PO dilaudid instead of oxycodone to see if any better, otherwise may have to send to SNF with 10-15 mg oxy She was on Oxy 10 BID prior to surgery  Likely not ready for SNF today I want  to be sure she can tolerate pain without IV meds today Will need change to prevena dressing Will re-evaluate Hemovac tomorrow Should be ready tomorrow  Griffith Citron, PA-C Orthopedic Surgery [phone] 03/15/2023, 7:36 AM

## 2023-03-15 NOTE — Plan of Care (Signed)

## 2023-03-16 MED ORDER — SENNA 8.6 MG PO TABS: 1.0000 | ORAL_TABLET | Freq: Two times a day (BID) | ORAL | 0 refills | Status: AC

## 2023-03-16 MED ORDER — CEFAZOLIN IV (FOR PTA / DISCHARGE USE ONLY): 2.0000 g | Freq: Three times a day (TID) | INTRAVENOUS | 0 refills | Status: AC

## 2023-03-16 MED ORDER — METHOCARBAMOL 500 MG PO TABS: 500.0000 mg | ORAL_TABLET | Freq: Three times a day (TID) | ORAL | 0 refills | Status: DC | PRN

## 2023-03-16 MED ORDER — POLYETHYLENE GLYCOL 3350 17 G PO PACK: 17.0000 g | PACK | Freq: Every day | ORAL | 0 refills | Status: DC | PRN

## 2023-03-16 MED ORDER — JUVEN PO PACK: 1.0000 | PACK | Freq: Two times a day (BID) | ORAL | 0 refills | Status: AC

## 2023-03-16 MED ORDER — HYDROMORPHONE HCL 2 MG PO TABS: 2.0000 mg | ORAL_TABLET | ORAL | 0 refills | Status: DC | PRN

## 2023-03-16 MED ORDER — HEPARIN SOD (PORK) LOCK FLUSH 100 UNIT/ML IV SOLN
250.0000 [IU] | INTRAVENOUS | Status: AC | PRN
  Administered 2023-03-16: 250 [IU]
  Filled 2023-03-16: qty 2.5

## 2023-03-16 MED ORDER — ALPRAZOLAM 1 MG PO TABS: 1.0000 mg | ORAL_TABLET | Freq: Four times a day (QID) | ORAL | 0 refills | Status: AC | PRN

## 2023-03-16 NOTE — Progress Notes (Signed)
   Subjective: 6 Days Post-Op Procedure(s) (LRB): Irrigation and debridement left knee, resection total knee arthroplasty, antibiotic spacer placement (Left) Patient reports pain as mild.   Patient seen in rounds for Dr. Lyla Glassing. Patient is well, and has had no acute complaints or problems. No acute events overnight. She is very sleepy this morning, but I did wake her up. She tells me her pain was better with the dilaudid yesterday, and she did well without any IV meds.  We will continue therapy today.   Objective: Vital signs in last 24 hours: Temp:  [99 F (37.2 C)-99.7 F (37.6 C)] 99.2 F (37.3 C) (04/02 0528) Pulse Rate:  [87-103] 101 (04/02 0528) Resp:  [15-19] 17 (04/02 0528) BP: (133-148)/(62-77) 133/66 (04/02 0528) SpO2:  [87 %-92 %] 87 % (04/02 0528)  Intake/Output from previous day:  Intake/Output Summary (Last 24 hours) at 03/16/2023 0658 Last data filed at 03/16/2023 0300 Gross per 24 hour  Intake 1672.59 ml  Output 845 ml  Net 827.59 ml     Intake/Output this shift: Total I/O In: 906.5 [P.O.:720; I.V.:86.5; IV Piggyback:100] Out: 515 [Urine:500; Drains:15]  Labs: No results for input(s): "HGB" in the last 72 hours. No results for input(s): "WBC", "RBC", "HCT", "PLT" in the last 72 hours. No results for input(s): "NA", "K", "CL", "CO2", "BUN", "CREATININE", "GLUCOSE", "CALCIUM" in the last 72 hours. No results for input(s): "LABPT", "INR" in the last 72 hours.  Exam: General - Patient is Alert and Oriented Extremity - Neurologically intact Sensation intact distally Intact pulses distally Dorsiflexion/Plantar flexion intact Dressing - wound vac in place with good seal Motor Function - intact, moving foot and toes well on exam.   Past Medical History:  Diagnosis Date   Anemia    Anxiety    Arthritis    CAD (coronary artery disease)    COPD (chronic obstructive pulmonary disease) (HCC)    Depression    Dyspnea    Fibromyalgia    GERD  (gastroesophageal reflux disease)    Hiatal hernia    History of kidney stones    Migraine headache    Multinodular goiter    Nephrolithiasis    Pneumonia    Pulmonary embolism ([place]) 1996   Sleep apnea    Thyroid nodule     Assessment/Plan: 6 Days Post-Op Procedure(s) (LRB): Irrigation and debridement left knee, resection total knee arthroplasty, antibiotic spacer placement (Left) Principal Problem:   Surgical wound dehiscence Active Problems:   Infected prosthetic knee joint  Estimated body mass index is 40.43 kg/m as calculated from the following:   Height as of this encounter: 5\' 5"  (1.651 m).   Weight as of this encounter: 110.2 kg. Advance diet Up with therapy D/C IV fluids    DVT Prophylaxis - Xarelto TDWB LLE  Hemovac drain with 45 cc output yesterday, has been in for 6 days I felt it better to remove this today before discharge than to manage at the SNF  Both drains out at this point, covered with mepilex dressing Wound vac in place Change to prevena vac at discharge  Hopefully patient ready for discharge to SNF today Scripts on chart Will do discharge summary  Griffith Citron, [place] [facility] [phone] 03/16/2023, 6:58 AM

## 2023-03-16 NOTE — Discharge Summary (Signed)
Physician Discharge Summary   Patient ID: Norma Anthony MRN: [id number] DOB/AGE: Oct 03, 1956 67 y.o.  Admit date: 03/08/2023 Discharge date: 03/16/23  Primary Diagnosis: 1. 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   Admission Diagnoses:  Past Medical History:  Diagnosis Date   Anemia    Anxiety    Arthritis    CAD (coronary artery disease)    COPD (chronic obstructive pulmonary disease) (HCC)    Depression    Dyspnea    Fibromyalgia    GERD (gastroesophageal reflux disease)    Hiatal hernia    History of kidney stones    Migraine headache    Multinodular goiter    Nephrolithiasis    Pneumonia    Pulmonary embolism ([place]) 1996   Sleep apnea    Thyroid nodule    Discharge Diagnoses:   Principal Problem:   Surgical wound dehiscence Active Problems:   Infected prosthetic knee joint  Estimated body mass index is 40.43 kg/m as calculated from the following:   Height as of this encounter: 5\' 5"  (1.651 m).   Weight as of this encounter: 110.2 kg.  Procedure:  Procedure(s) (LRB): Irrigation and debridement left knee, resection total knee arthroplasty, antibiotic spacer placement (Left)   Consults: None  HPI:  Norma Anthony is a 67 y.o. female who underwent primary left total knee arthroplasty by Dr. Tonita Cong on 11/04/2022 with DePuy attune RP knee system.  The patient subsequently fell, resulting in traumatic dehiscence of her incision on 12/11/2022.  Dr. Stann Mainland was the physician on-call and took her to the operating room.  At the time of surgery, he found that she had a subacute failure of her medial arthrotomy repair.  He did a revision repair of the arthrotomy and closed her skin.  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  Dr. Tonita Cong aspirated her knee and sent the synovial fluid for Synovasure analysis, which was completely negative for infection.  I saw her in the  office and she was found to have gross patellar instability with palpable defect over the medial peripatellar retinacular tissues.  I took her to the OR on 02/03/2023 for repair of medial parapatellar arthrotomy and polyliner exchange.  Postoperatively, she developed a subcutaneous fluid collection, which I aspirated in the office on 03/02/2023.  She was started on p.o doxycycline after the aspiration.  Her cultures ended up growing MSSA.  She subsequently also developed areas of skin edge necrosis in the incision that began draining purulent material.  She was also experiencing fevers and malaise.  She was direct admitted to the hospital earlier this week and started on IV Ancef.  Admission blood work revealed C-reactive protein 116 mg/L and sed rate 135 mm/h.  She was indicated for two-stage procedure.  The risks, benefits, and alternatives to the procedure were explained, and the patient elected to proceed.   Laboratory Data: Admission on 03/08/2023  Component Date Value Ref Range Status   WBC 03/08/2023 9.3  4.0 - 10.5 K/uL Final   RBC 03/08/2023 3.82 (L)  3.87 - 5.11 MIL/uL Final   Hemoglobin 03/08/2023 11.4 (L)  12.0 - 15.0 g/dL Final   HCT 03/08/2023 36.5  36.0 - 46.0 % Final   MCV 03/08/2023 95.5  80.0 - 100.0 fL Final   MCH 03/08/2023 29.8  26.0 - 34.0 pg Final   MCHC 03/08/2023 31.2  30.0 - 36.0 g/dL Final   RDW 03/08/2023 14.6  11.5 -  15.5 % Final   Platelets 03/08/2023 208  150 - 400 K/uL Final   nRBC 03/08/2023 0.2  0.0 - 0.2 % Final   Performed at [facility], [place] [address], [place], [place] [address]   Specimen Description 03/08/2023    Final                   Value:BLOOD SITE NOT SPECIFIED AEROBIC BOTTLE ONLY Performed at [place] [address], [place], [place] [address]    Special Requests 03/08/2023    Final                   Value:BOTTLES DRAWN AEROBIC ONLY Blood Culture adequate volume Performed at [facility],  [place] [address], [place], [place] [address]    Culture 03/08/2023    Final                   Value:NO GROWTH 5 DAYS Performed at [place] [address] [place], [place] [address]    Report Status 03/08/2023 03/13/2023 FINAL   Final   Specimen Description 03/08/2023    Final                   Value:BLOOD BLOOD RIGHT HAND AEROBIC BOTTLE ONLY Performed at [facility], [place] [address] [place], [place] [address]    Special Requests 03/08/2023    Final                   Value:BOTTLES DRAWN AEROBIC ONLY Blood Culture adequate volume Performed at [facility], [place] [address], [place], [place] [address]    Culture 03/08/2023    Final                   Value:NO GROWTH 5 DAYS Performed at [place] [address] [place], [place] [address]    Report Status 03/08/2023 03/13/2023 FINAL   Final   Sodium 03/08/2023 139  135 - 145 mmol/L Final   Potassium 03/08/2023 3.9  3.5 - 5.1 mmol/L Final   Chloride 03/08/2023 102  98 - 111 mmol/L Final   CO2 03/08/2023 22  22 - 32 mmol/L Final   Glucose, Bld 03/08/2023 95  70 - 99 mg/dL Final   Glucose reference range applies only to samples taken after fasting for at least 8 hours.   BUN 03/08/2023 12  8 - 23 mg/dL Final   Creatinine, Ser 03/08/2023 0.67  0.44 - 1.00 mg/dL Final   Calcium 03/08/2023 8.7 (L)  8.9 - 10.3 mg/dL Final   Total Protein 03/08/2023 7.3  6.5 - 8.1 g/dL Final   Albumin 03/08/2023 2.6 (L)  3.5 - 5.0 g/dL Final   AST 03/08/2023 27  15 - 41 U/L Final   ALT 03/08/2023 14  0 - 44 U/L Final   Alkaline Phosphatase 03/08/2023 58  38 - 126 U/L Final   Total Bilirubin 03/08/2023 0.7  0.3 - 1.2 mg/dL Final   GFR, Estimated 03/08/2023 >60  >60 mL/min Final   Comment: (NOTE) Calculated using the CKD-EPI Creatinine Equation (2021)    Anion gap 03/08/2023 15  5 - 15 Final   Performed at [facility], [place] [address] [place], [place] [address]   Color, Urine  03/10/2023 YELLOW  YELLOW Final   APPearance 03/10/2023 CLEAR  CLEAR Final   Specific Gravity, Urine 03/10/2023 1.012  1.005 - 1.030 Final  pH 03/10/2023 5.0  5.0 - 8.0 Final   Glucose, UA 03/10/2023 NEGATIVE  NEGATIVE mg/dL Final   Hgb urine dipstick 03/10/2023 NEGATIVE  NEGATIVE Final   Bilirubin Urine 03/10/2023 NEGATIVE  NEGATIVE Final   Ketones, ur 03/10/2023 NEGATIVE  NEGATIVE mg/dL Final   Protein, ur 03/10/2023 NEGATIVE  NEGATIVE mg/dL Final   Nitrite 03/10/2023 NEGATIVE  NEGATIVE Final   Leukocytes,Ua 03/10/2023 NEGATIVE  NEGATIVE Final   Performed at [place] [address] [place], [place] [address]   ABO/RH(D) 03/10/2023 A POS   Final   Antibody Screen 03/10/2023 NEG   Final   Sample Expiration 03/10/2023 03/13/2023,2359   Final   Unit Number 03/10/2023 [id number]   Final   Blood Component Type 03/10/2023 RED CELLS,LR   Final   Unit division 03/10/2023 00   Final   Status of Unit 03/10/2023 [facility]   Final   Transfusion Status 03/10/2023 OK TO TRANSFUSE   Final   Crossmatch Result 03/10/2023    Final                   Value:Compatible Performed at [facility], [place] [address] [place], [place] [address]    Unit Number 03/10/2023 [id number]   Final   Blood Component Type 03/10/2023 RED CELLS,LR   Final   Unit division 03/10/2023 00   Final   Status of Unit 03/10/2023 ISSUED,FINAL   Final   Transfusion Status 03/10/2023 OK TO TRANSFUSE   Final   Crossmatch Result 03/10/2023 Compatible   Final   Sodium 03/10/2023 142  135 - 145 mmol/L Final   Potassium 03/10/2023 3.4 (L)  3.5 - 5.1 mmol/L Final   Chloride 03/10/2023 107  98 - 111 mmol/L Final   CO2 03/10/2023 26  22 - 32 mmol/L Final   Glucose, Bld 03/10/2023 92  70 - 99 mg/dL Final   Glucose reference range applies only to samples taken after fasting for at least 8 hours.   BUN 03/10/2023 7 (L)  8 - 23 mg/dL Final   Creatinine, Ser 03/10/2023 0.64  0.44 - 1.00  mg/dL Final   Calcium 03/10/2023 8.6 (L)  8.9 - 10.3 mg/dL Final   Total Protein 03/10/2023 6.2 (L)  6.5 - 8.1 g/dL Final   Albumin 03/10/2023 2.2 (L)  3.5 - 5.0 g/dL Final   AST 03/10/2023 19  15 - 41 U/L Final   ALT 03/10/2023 12  0 - 44 U/L Final   Alkaline Phosphatase 03/10/2023 47  38 - 126 U/L Final   Total Bilirubin 03/10/2023 0.4  0.3 - 1.2 mg/dL Final   GFR, Estimated 03/10/2023 >60  >60 mL/min Final   Comment: (NOTE) Calculated using the CKD-EPI Creatinine Equation (2021)    Anion gap 03/10/2023 9  5 - 15 Final   Performed at [facility], [place] [address] [place], [place] [address]   Prothrombin Time 03/10/2023 15.6 (H)  11.4 - 15.2 seconds Final   INR 03/10/2023 1.3 (H)  0.8 - 1.2 Final   Comment: (NOTE) INR goal varies based on device and disease states. Performed at [facility], [place] [address], [place],  [address]    MRSA, PCR 03/10/2023 NEGATIVE  NEGATIVE Final   Staphylococcus aureus 03/10/2023 NEGATIVE  NEGATIVE Final   Comment: (NOTE) The Xpert SA Assay (FDA approved for NASAL specimens in patients 42 years of age and older), is one component of a comprehensive surveillance program. It is not intended to diagnose  infection nor to guide or monitor treatment. Performed at [facility], [place] [address], [place], [place] [address]    ABO/RH(D) 03/10/2023    Final                   Value:A POS Performed at [facility], [place] [address] [place], [place] [address]    WBC 03/10/2023 8.8  4.0 - 10.5 K/uL Final   RBC 03/10/2023 2.95 (L)  3.87 - 5.11 MIL/uL Final   Hemoglobin 03/10/2023 8.8 (L)  12.0 - 15.0 g/dL Final   HCT 03/10/2023 29.2 (L)  36.0 - 46.0 % Final   MCV 03/10/2023 99.0  80.0 - 100.0 fL Final   MCH 03/10/2023 29.8  26.0 - 34.0 pg Final   MCHC 03/10/2023 30.1  30.0 - 36.0 g/dL Final   RDW 03/10/2023 14.7  11.5 - 15.5 % Final   Platelets 03/10/2023 328  150 - 400 K/uL  Final   nRBC 03/10/2023 0.5 (H)  0.0 - 0.2 % Final   Performed at [facility], [place] [address], [place], [place] [address]   Sed Rate 03/10/2023 135 (H)  0 - 22 mm/hr Final   Performed at [facility], [place] [address], [place], [place] [address]   CRP 03/10/2023 11.6 (H)  <1.0 mg/dL Final   Performed at [place] [address] [place], [place] [address]   Order Confirmation 03/10/2023    Final                   Value:ORDER PROCESSED BY BLOOD BANK Performed at [facility] [place] [address] [place], [place] [address]    ISSUE DATE / TIME 03/10/2023 [id number]   Final   Blood Product Unit Number 03/10/2023 [id number]   Final   PRODUCT CODE 03/10/2023 [id number]   Final   Unit Type and Rh 03/10/2023 6200   Final   Blood Product Expiration Date 03/10/2023 [id number]   Final   ISSUE DATE / TIME 03/10/2023 [id number]   Final   Blood Product Unit Number 03/10/2023 [id number]   Final   PRODUCT CODE 03/10/2023 [id number]   Final   Unit Type and Rh 03/10/2023 6200   Final   Blood Product Expiration Date 03/10/2023 [id number]   Final   Specimen Description 03/10/2023    Final                   Value:TISSUE Performed at [facility], [place] [address], [place], [place] [address]    Special Requests 03/10/2023    Final                   Value:LEFT KNEE Performed at [facility], [place] [address] [place], [place] [address]    Gram Stain 03/10/2023    Final                   Value:NO WBC SEEN NO ORGANISMS SEEN    Culture 03/10/2023    Final                   Value:RARE STAPHYLOCOCCUS AUREUS NO ANAEROBES ISOLATED Performed at [facility], [place] [address] [place], [place] [address]    Report Status 03/10/2023 03/15/2023 FINAL   Final   Organism ID, Bacteria 03/10/2023 STAPHYLOCOCCUS AUREUS   Final   WBC 03/11/2023 19.7 (H)  4.0 - 10.5 K/uL Final   RBC 03/11/2023  3.10 (L)  3.87  - 5.11 MIL/uL Final   Hemoglobin 03/11/2023 9.3 (L)  12.0 - 15.0 g/dL Final   HCT 03/11/2023 28.7 (L)  36.0 - 46.0 % Final   MCV 03/11/2023 92.6  80.0 - 100.0 fL Final   MCH 03/11/2023 30.0  26.0 - 34.0 pg Final   MCHC 03/11/2023 32.4  30.0 - 36.0 g/dL Final   RDW 03/11/2023 16.2 (H)  11.5 - 15.5 % Final   Platelets 03/11/2023 375  150 - 400 K/uL Final   nRBC 03/11/2023 0.2  0.0 - 0.2 % Final   Performed at [facility], [place] [address] [place], [place] [address]   Sodium 03/11/2023 136  135 - 145 mmol/L Final   Potassium 03/11/2023 4.4  3.5 - 5.1 mmol/L Final   HEMOLYSIS AT THIS LEVEL MAY AFFECT RESULT   Chloride 03/11/2023 102  98 - 111 mmol/L Final   CO2 03/11/2023 27  22 - 32 mmol/L Final   Glucose, Bld 03/11/2023 145 (H)  70 - 99 mg/dL Final   Glucose reference range applies only to samples taken after fasting for at least 8 hours.   BUN 03/11/2023 8  8 - 23 mg/dL Final   Creatinine, Ser 03/11/2023 0.56  0.44 - 1.00 mg/dL Final   Calcium 03/11/2023 7.7 (L)  8.9 - 10.3 mg/dL Final   GFR, Estimated 03/11/2023 >60  >60 mL/min Final   Comment: (NOTE) Calculated using the CKD-EPI Creatinine Equation (2021)    Anion gap 03/11/2023 7  5 - 15 Final   Performed at [facility], [place] [address] [place], [place] [address]   WBC 03/12/2023 14.7 (H)  4.0 - 10.5 K/uL Final   RBC 03/12/2023 2.86 (L)  3.87 - 5.11 MIL/uL Final   Hemoglobin 03/12/2023 8.4 (L)  12.0 - 15.0 g/dL Final   HCT 03/12/2023 27.4 (L)  36.0 - 46.0 % Final   MCV 03/12/2023 95.8  80.0 - 100.0 fL Final   MCH 03/12/2023 29.4  26.0 - 34.0 pg Final   MCHC 03/12/2023 30.7  30.0 - 36.0 g/dL Final   RDW 03/12/2023 15.7 (H)  11.5 - 15.5 % Final   Platelets 03/12/2023 357  150 - 400 K/uL Final   nRBC 03/12/2023 0.0  0.0 - 0.2 % Final   Performed at [facility], [place] [address] [place], [place] [address]   Prealbumin 03/12/2023 10 (L)  18 - 38 mg/dL Final   Performed at  [facility], [place] [address] [place], [place] [address]  Admission on 02/03/2023, Discharged on 02/05/2023  Component Date Value Ref Range Status   WBC 02/04/2023 10.4  4.0 - 10.5 K/uL Final   RBC 02/04/2023 3.45 (L)  3.87 - 5.11 MIL/uL Final   Hemoglobin 02/04/2023 10.7 (L)  12.0 - 15.0 g/dL Final   HCT 02/04/2023 34.9 (L)  36.0 - 46.0 % Final   MCV 02/04/2023 101.2 (H)  80.0 - 100.0 fL Final   MCH 02/04/2023 31.0  26.0 - 34.0 pg Final   MCHC 02/04/2023 30.7  30.0 - 36.0 g/dL Final   RDW 02/04/2023 13.5  11.5 - 15.5 % Final   Platelets 02/04/2023 186  150 - 400 K/uL Final   nRBC 02/04/2023 0.0  0.0 - 0.2 % Final   Performed at [facility], [place] [address] [place], [place] [address]   Sodium 02/04/2023 138  135 - 145 mmol/L Final   Potassium 02/04/2023 4.7  3.5 -  5.1 mmol/L Final   Chloride 02/04/2023 103  98 - 111 mmol/L Final   CO2 02/04/2023 26  22 - 32 mmol/L Final   Glucose, Bld 02/04/2023 169 (H)  70 - 99 mg/dL Final   Glucose reference range applies only to samples taken after fasting for at least 8 hours.   BUN 02/04/2023 19  8 - 23 mg/dL Final   Creatinine, Ser 02/04/2023 0.96  0.44 - 1.00 mg/dL Final   Calcium 02/04/2023 8.5 (L)  8.9 - 10.3 mg/dL Final   GFR, Estimated 02/04/2023 >60  >60 mL/min Final   Comment: (NOTE) Calculated using the CKD-EPI Creatinine Equation (2021)    Anion gap 02/04/2023 9  5 - 15 Final   Performed at [facility], [place] [address], [place], [place] [address]   WBC 02/05/2023 9.6  4.0 - 10.5 K/uL Final   RBC 02/05/2023 3.15 (L)  3.87 - 5.11 MIL/uL Final   Hemoglobin 02/05/2023 9.8 (L)  12.0 - 15.0 g/dL Final   HCT 02/05/2023 31.8 (L)  36.0 - 46.0 % Final   MCV 02/05/2023 101.0 (H)  80.0 - 100.0 fL Final   MCH 02/05/2023 31.1  26.0 - 34.0 pg Final   MCHC 02/05/2023 30.8  30.0 - 36.0 g/dL Final   RDW 02/05/2023 13.9  11.5 - 15.5 % Final   Platelets 02/05/2023 165  150 - 400 K/uL Final   nRBC  02/05/2023 0.0  0.0 - 0.2 % Final   Performed at [facility] [place] [address] [place], [place] [address]  Hospital Outpatient Visit on 02/02/2023  Component Date Value Ref Range Status   Sodium 02/02/2023 141  135 - 145 mmol/L Final   Potassium 02/02/2023 3.8  3.5 - 5.1 mmol/L Final   Chloride 02/02/2023 104  98 - 111 mmol/L Final   CO2 02/02/2023 27  22 - 32 mmol/L Final   Glucose, Bld 02/02/2023 111 (H)  70 - 99 mg/dL Final   Glucose reference range applies only to samples taken after fasting for at least 8 hours.   BUN 02/02/2023 13  8 - 23 mg/dL Final   Creatinine, Ser 02/02/2023 0.53  0.44 - 1.00 mg/dL Final   Calcium 02/02/2023 8.7 (L)  8.9 - 10.3 mg/dL Final   Total Protein 02/02/2023 7.0  6.5 - 8.1 g/dL Final   Albumin 02/02/2023 3.3 (L)  3.5 - 5.0 g/dL Final   AST 02/02/2023 39  15 - 41 U/L Final   ALT 02/02/2023 27  0 - 44 U/L Final   Alkaline Phosphatase 02/02/2023 72  38 - 126 U/L Final   Total Bilirubin 02/02/2023 0.2 (L)  0.3 - 1.2 mg/dL Final   GFR, Estimated 02/02/2023 >60  >60 mL/min Final   Comment: (NOTE) Calculated using the CKD-EPI Creatinine Equation (2021)    Anion gap 02/02/2023 10  5 - 15 Final   Performed at [facility], [place] [address] [place], [place] [address]   WBC 02/02/2023 7.3  4.0 - 10.5 K/uL Final   RBC 02/02/2023 4.07  3.87 - 5.11 MIL/uL Final   Hemoglobin 02/02/2023 12.6  12.0 - 15.0 g/dL Final   HCT 02/02/2023 40.8  36.0 - 46.0 % Final   MCV 02/02/2023 100.2 (H)  80.0 - 100.0 fL Final   MCH 02/02/2023 31.0  26.0 - 34.0 pg Final   MCHC 02/02/2023 30.9  30.0 - 36.0 g/dL Final   RDW 02/02/2023 13.2  11.5 - 15.5 % Final  Platelets 02/02/2023 193  150 - 400 K/uL Final   nRBC 02/02/2023 0.0  0.0 - 0.2 % Final   Performed at [facility], [place] [address], [place], [place] [address]     X-Rays:US EKG SITE RITE  Result Date: 03/11/2023 If [facility] image not attached, placement could not be  confirmed due to current cardiac rhythm.  DG Knee Left Port  Result Date: 03/10/2023 CLINICAL DATA:  Status post incision and drainage of the left knee. EXAM: PORTABLE LEFT KNEE - 1-2 VIEW COMPARISON:  Knee radiographs dated 02/03/2023. FINDINGS: The patient is status post a knee arthrodesis with two intramedullary nails and radiopaque fixation material across the knee joint. A drain overlies the operative site. Soft tissue gas is seen. IMPRESSION: Status post knee arthrodesis with two intramedullary nails. Electronically Signed   By: Norma Anthony M.D.   On: 03/10/2023 18:03    EKG: Orders placed or performed during the hospital encounter of 11/20/22   EKG 12-Lead   EKG 12-Lead   EKG     Hospital Course: Norma Anthony is a 67 y.o. who was admitted to [facility]. They were brought to the operating room on 03/10/2023 and underwent Procedure(s): Irrigation and debridement left knee, resection total knee arthroplasty, antibiotic spacer placement.  Patient tolerated the procedure well and was later transferred to the recovery room and then to the orthopaedic floor for postoperative care. They were given PO and IV analgesics for pain control following their surgery. They were given 24 hours of postoperative antibiotics of  Anti-infectives (From admission, onward)    Start     Dose/Rate Route Frequency Ordered Stop   03/16/23 0000  ceFAZolin (ANCEF) IVPB        2 g Intravenous Every 8 hours 03/16/23 0704 04/27/23 2359   03/10/23 1900  ceFAZolin (ANCEF) IVPB 2g/100 mL premix  Status:  Discontinued        2 g 200 mL/hr over 30 Minutes Intravenous Every 6 hours 03/10/23 1852 03/10/23 1858   03/10/23 1615  tobramycin (NEBCIN) powder  Status:  Discontinued          As needed 03/10/23 1616 03/10/23 1852   03/10/23 1315  vancomycin (VANCOREADY) IVPB 1500 mg/300 mL        1,500 mg 150 mL/hr over 120 Minutes Intravenous On call to O.R. 03/10/23 0837 03/10/23 1512   03/10/23 1245  ceFAZolin (ANCEF)  IVPB 2g/100 mL premix        2 g 200 mL/hr over 30 Minutes Intravenous On call to O.R. 03/10/23 0837 03/10/23 1350   03/08/23 1400  ceFAZolin (ANCEF) IVPB 2g/100 mL premix        2 g 200 mL/hr over 30 Minutes Intravenous Every 8 hours 03/08/23 1331        and started on DVT prophylaxis in the form of Xarelto.   PT and OT were ordered for total joint protocol. Discharge planning consulted to help with postop disposition and equipment needs. Patient had a fair night on the evening of surgery. She began working with PT on POD #1. PICC placed on 03/11/23. Ancef selected based on in office cultures. It was determined she would need SNF placement at discharge. Accepted for placement at  [facility] pending medical stability. Continued making progress with PT. Pain management improved with med change.   Pt worked with therapy on POD #6 and was meeting their goals. She was discharged to SNF later that day in stable condition.  Diet: Regular  diet Activity: TDWB Follow-up: in 7 days Disposition: Skilled nursing facility Discharged Condition: good   Discharge Instructions     [facility] pharmacist to adjust dose for Vancomycin, Aminoglycosides and other anti-infective therapies as requested by physician.   Complete by: As directed    [facility] to provide Cath Flo 2mg    Complete by: As directed    Administer for PICC line occlusion and as ordered by physician for other access device issues.   Anaphylaxis Kit: Provided to treat any anaphylactic reaction to the medication being provided to the patient if First Dose or when requested by physician   Complete by: As directed    Epinephrine 1mg /ml vial / amp: Administer 0.3mg  (0.22ml) subcutaneously once for moderate to severe anaphylaxis, nurse to call physician and pharmacy when reaction occurs and call 911 if needed for immediate care   Diphenhydramine 50mg /ml IV vial: Administer 25-50mg  IV/IM PRN for first dose reaction,  rash, itching, mild reaction, nurse to call physician and pharmacy when reaction occurs   Sodium Chloride 0.9% NS 539ml IV: Administer if needed for hypovolemic blood pressure drop or as ordered by physician after call to physician with anaphylactic reaction   Change dressing on IV access line weekly and PRN   Complete by: As directed    Flush IV access with Sodium Chloride 0.9% and Heparin 10 units/ml or 100 units/ml   Complete by: As directed    Home infusion instructions - [facility]   Complete by: As directed    Instructions: Flush IV access with Sodium Chloride 0.9% and Heparin 10units/ml or 100units/ml   Change dressing on IV access line: Weekly and PRN   Instructions Cath Flo 2mg : Administer for PICC Line occlusion and as ordered by physician for other access device   [facility] pharmacist to adjust dose for: Vancomycin, Aminoglycosides and other anti-infective therapies as requested by physician   Method of administration may be changed at the discretion of home infusion pharmacist based upon assessment of the patient and/or caregiver's ability to self-administer the medication ordered   Complete by: As directed       Allergies as of 03/16/2023       Reactions   Lyrica [pregabalin] Other (See Comments)   Paralysis present (finding)- not tolerated Pt stated she could not move her body but she could think    Belbuca [buprenorphine Hcl] Other (See Comments)   " Horrible headache"    Compazine [prochlorperazine] Anxiety   Droperidol Anxiety   Ergotamine-caffeine Anxiety, Hypertension   Increased systolic arterial pressure (finding)        Medication List     STOP taking these medications    ciprofloxacin 250 MG tablet Commonly known as: CIPRO   Corn & Callus Remover 17 % external solution Generic drug: salicylic acid-lactic acid   doxycycline 100 MG capsule Commonly known as: VIBRAMYCIN   GOODY HEADACHE PO   oxyCODONE 5 MG immediate release  tablet Commonly known as: Roxicodone   oxyCODONE-acetaminophen 10-325 MG tablet Commonly known as: PERCOCET   tiZANidine 2 MG tablet Commonly known as: ZANAFLEX       TAKE these medications    albuterol 108 (90 Base) MCG/ACT inhaler Commonly known as: VENTOLIN HFA Inhale 2 puffs into the lungs every 6 (six) hours as needed for wheezing or shortness of breath.   ALPRAZolam 1 MG tablet Commonly known as: XANAX Take 1 tablet (1 mg total) by mouth every 6 (six) hours as needed for anxiety or  sleep. What changed: when to take this   b complex vitamins capsule Take 1 capsule by mouth daily.   ceFAZolin  IVPB Commonly known as: ANCEF Inject 2 g into the vein every 8 (eight) hours. Indication:  MSSA L-knee PJI First Dose: Yes Last Day of Therapy:  04/22/23 Labs - Once weekly:  CBC/D and BMP, Labs - Every other week:  ESR and CRP Method of administration: IV Push Pull PICC line after completion of IV therapy Method of administration may be changed at the discretion of home infusion pharmacist based upon assessment of the patient and/or caregiver's ability to self-administer the medication ordered.   clindamycin-benzoyl peroxide gel Commonly known as: BENZACLIN Apply 1 Application topically 2 (two) times daily as needed (for acne breakouts).   clotrimazole-betamethasone cream Commonly known as: LOTRISONE Apply 1 Application topically 2 (two) times daily as needed (for irritation under the breasts or abdominal folds).   diphenhydrAMINE 25 MG tablet Commonly known as: SOMINEX Take 50-75 mg by mouth daily as needed for allergies or sleep.   DULoxetine 60 MG capsule Commonly known as: CYMBALTA Take 60 mg by mouth at bedtime.   famotidine 20 MG tablet Commonly known as: Pepcid One after supper What changed:  how much to take how to take this when to take this additional instructions   Ferrous Fumarate-Folic Acid 99991111 MG Tabs Take 1 tablet by mouth daily. Hematinic    fluticasone 50 MCG/ACT nasal spray Commonly known as: FLONASE Place 1 spray into both nostrils daily as needed for allergies or rhinitis.   gabapentin 400 MG capsule Commonly known as: NEURONTIN Take 400 mg by mouth at bedtime.   Hair Skin and Nails Formula Tabs Take 1 tablet by mouth daily.   HYDROmorphone 2 MG tablet Commonly known as: DILAUDID Take 1 tablet (2 mg total) by mouth every 4 (four) hours as needed for severe pain.   melatonin 5 MG Tabs Take 10 mg by mouth at bedtime.   methocarbamol 500 MG tablet Commonly known as: ROBAXIN Take 1 tablet (500 mg total) by mouth every 8 (eight) hours as needed for muscle spasms.   naloxone 4 MG/0.1ML Liqd nasal spray kit Commonly known as: NARCAN Place 1 spray into the nose once as needed (accidental over dose).   nutrition supplement (JUVEN) Pack Take 1 packet by mouth 2 (two) times daily between meals for 14 days.   ondansetron 4 MG tablet Commonly known as: Zofran Take 1 tablet (4 mg total) by mouth every 8 (eight) hours as needed for nausea or vomiting.   pantoprazole 40 MG tablet Commonly known as: PROTONIX Take 40 mg by mouth daily with breakfast.   polyethylene glycol 17 g packet Commonly known as: MIRALAX / GLYCOLAX Take 17 g by mouth daily as needed for mild constipation.   promethazine 25 MG tablet Commonly known as: PHENERGAN Take 25 mg by mouth every 6 (six) hours as needed for nausea or vomiting.   senna 8.6 MG Tabs tablet Commonly known as: SENOKOT Take 1 tablet (8.6 mg total) by mouth 2 (two) times daily.   Xarelto 20 MG Tabs tablet Generic drug: rivaroxaban Take 20 mg by mouth at bedtime.               Discharge Care Instructions  (From admission, onward)           Start     Ordered   03/16/23 0000  Change dressing on IV access line weekly and PRN  (Home infusion instructions -  [facility] )        03/16/23 0704            Contact information for follow-up providers      Swinteck, Aaron Edelman, MD. Schedule an appointment as soon as possible for a visit in 7 day(s).   Specialty: Orthopedic Surgery Why: Make an appointment within 7 days of discharge from the facility for removal of negative pressure incisional dressing and wound re-check. Contact information: [address] [place] [address] [phone]              Contact information for after-discharge care     Destination     [facility] .   Service: Skilled Nursing Contact information: [address] [place] [phone]                     Signed: Griffith Citron, PA-C Orthopedic Surgery 03/16/2023, 7:06 AM

## 2023-03-16 NOTE — Plan of Care (Signed)

## 2023-03-16 NOTE — TOC Transition Note (Signed)
Transition of Care [facility]) - CM/SW Discharge Note   Patient Details  Name: Norma Anthony MRN: [id number] Date of Birth: 05-Oct-1956  Transition of [facility]) CM/SW Contact:  Lennart Pall, LCSW Phone Number: 03/16/2023, 10:43 AM   Clinical Narrative:     Pt medically cleared for dc today to [facility].  Have received insurance authorization (auth# [id number]).  Pt and spouse aware and agreeable with dc today. PTAR called with request for 1:30 pick up time.  RN to call report to [phone].  No further TOC needs.  Final next level of care: Skilled Nursing Facility Barriers to Discharge: Barriers Resolved   Patient Goals and CMS Choice CMS Medicare.gov Compare Post Acute Care list provided to:: Patient Choice offered to / list presented to : Patient  Discharge Placement     Existing PASRR number confirmed : 03/11/23          Patient chooses bed at: Other - please specify in the comment section below: ([place]) Patient to be transferred to facility by: [place] Name of family member notified: spouse Patient and family notified of of transfer: 03/16/23  Discharge Plan and Services Additional resources added to the After Visit Summary for   In-house Referral: Clinical Social Work   Post Acute Care Choice: [place]          DME Arranged: N/A DME Agency: NA                  Social Determinants of Health ([place]) Interventions SDOH Screenings   Food Insecurity: No Food Insecurity (03/08/2023)  Housing: Low Risk  (03/08/2023)  Transportation Needs: No Transportation Needs (03/08/2023)  Utilities: Not At Risk (03/08/2023)  Tobacco Use: Medium Risk (03/11/2023)     Readmission Risk Interventions    03/11/2023    2:01 PM 11/06/2022    9:37 AM  Readmission Risk Prevention Plan  Transportation Screening Complete Complete  PCP or Specialist Appt within 3-5 Days  Complete  HRI or [place]  Complete  Social Work Consult for [place]  Planning/Counseling  Complete  Palliative Care Screening  Not Applicable  Medication Review Press photographer) Complete Complete  PCP or Specialist appointment within 3-5 days of discharge Complete   HRI or [place] Complete   SW Recovery Care/Counseling Consult Complete   Palliative Care Screening Not Applicable   Skilled Nursing Facility Complete

## 2023-03-16 NOTE — Progress Notes (Addendum)
Called facility, gave report to [facility], all questions answered. IV team capped PICC for discharge. Switched to [place] wound vac, dressing is clean/dry/intact, no error noted. Pt to discharged to SNF with belongings via PTAR.  17:13 Pt is alert and oriented x 4, not in any distress, discharged to facility via [place].

## 2023-03-22 ENCOUNTER — Ambulatory Visit: Payer: Self-pay | Admitting: Student

## 2023-03-31 ENCOUNTER — Other Ambulatory Visit: Payer: Self-pay | Admitting: Internal Medicine

## 2023-04-07 ENCOUNTER — Other Ambulatory Visit: Payer: Self-pay

## 2023-04-07 ENCOUNTER — Telehealth (INDEPENDENT_AMBULATORY_CARE_PROVIDER_SITE_OTHER): Payer: Medicare Other | Admitting: Internal Medicine

## 2023-04-07 ENCOUNTER — Encounter: Payer: Self-pay | Admitting: Internal Medicine

## 2023-04-07 DIAGNOSIS — T8454XD Infection and inflammatory reaction due to internal left knee prosthesis, subsequent encounter: Secondary | ICD-10-CM

## 2023-04-07 DIAGNOSIS — T8459XD Infection and inflammatory reaction due to other internal joint prosthesis, subsequent encounter: Secondary | ICD-10-CM

## 2023-04-07 NOTE — Progress Notes (Signed)
[facility]  CHIEF COMPLAINT:    Follow up for left knee PJI  SUBJECTIVE:    Norma Anthony is a 67 y.o. female with PMHx as below who presents to the clinic for left knee PJI.   Patient was admitted recently at [facility] from 3/25-03/16/23 for left knee PJI.  She underwent I&D, resection arthroplasty, and placement of antibiotic spacer on 03/10/23 after outpatient aspiration cultures grew MSSA.  Additionally, her OR cultures grew MSSA.  She had a PICC line placed and was sent out on Cefazolin 2gm q8h for 6 weeks through 04/21/23.  She was discharged to SNF on 03/16/23 and is currently residing back at home.   Her recent OPAT labs from 4/22 showed normal WBC and normal cardiac CRP.  Labs from 4/10 showed ESR 62, CRP 20.  She had follow up with Dr Linna Caprice on two occasions since discharge and reports her stitches are out and incision appears well.  Unfortunately, she developed acute worsening of her knee pain 2 days ago and is now having trouble moving her leg and using increased pain medications.  The pain is severe enough that she had to change her visit to a video visit today.  She has no fevers.   Please see A&P for the details of today's visit and status of the patient's medical problems.   Patient's Medications  New Prescriptions   No medications on file  Previous Medications   ALBUTEROL (VENTOLIN HFA) 108 (90 BASE) MCG/ACT INHALER    Inhale 2 puffs into the lungs every 6 (six) hours as needed for wheezing or shortness of breath.   ALPRAZOLAM (XANAX) 1 MG TABLET    Take 1 tablet (1 mg total) by mouth every 6 (six) hours as needed for anxiety or sleep.   B COMPLEX VITAMINS CAPSULE    Take 1 capsule by mouth daily.   CEFAZOLIN (ANCEF) IVPB    Inject 2 g into the vein every 8 (eight) hours. Indication:  MSSA L-knee PJI First Dose: Yes Last Day of Therapy:  04/22/23 Labs - Once weekly:  CBC/D and BMP, Labs - Every other week:  ESR and CRP Method of  administration: IV Push Pull PICC line after completion of IV therapy Method of administration may be changed at the discretion of home infusion pharmacist based upon assessment of the patient and/or caregiver's ability to self-administer the medication ordered.   CLINDAMYCIN-BENZOYL PEROXIDE (BENZACLIN) GEL    Apply 1 Application topically 2 (two) times daily as needed (for acne breakouts).   CLOTRIMAZOLE-BETAMETHASONE (LOTRISONE) CREAM    Apply 1 Application topically 2 (two) times daily as needed (for irritation under the breasts or abdominal folds).   DIPHENHYDRAMINE (SOMINEX) 25 MG TABLET    Take 50-75 mg by mouth daily as needed for allergies or sleep.   DULOXETINE (CYMBALTA) 60 MG CAPSULE    Take 60 mg by mouth at bedtime.   FAMOTIDINE (PEPCID) 20 MG TABLET    TAKE 1 TABLET BY MOUTH AFTER SUPPER   FERROUS FUMARATE-FOLIC ACID 324-1 MG TABS    Take 1 tablet by mouth daily. Hematinic   FLUTICASONE (FLONASE) 50 MCG/ACT NASAL SPRAY    Place 1 spray into both nostrils daily as needed for allergies or rhinitis.   GABAPENTIN (NEURONTIN) 400 MG CAPSULE    Take 400 mg by mouth at bedtime.   HYDROMORPHONE (DILAUDID) 2 MG TABLET    Take 1 tablet (2 mg total) by mouth every  4 (four) hours as needed for severe pain.   MELATONIN 5 MG TABS    Take 10 mg by mouth at bedtime.   MULTIPLE VITAMINS-MINERALS (HAIR SKIN AND NAILS FORMULA) TABS    Take 1 tablet by mouth daily.   NALOXONE (NARCAN) NASAL SPRAY 4 MG/0.1 ML    Place 1 spray into the nose once as needed (accidental over dose).   ONDANSETRON (ZOFRAN) 4 MG TABLET    Take 1 tablet (4 mg total) by mouth every 8 (eight) hours as needed for nausea or vomiting.   PANTOPRAZOLE (PROTONIX) 40 MG TABLET    Take 40 mg by mouth daily with breakfast.   POLYETHYLENE GLYCOL (MIRALAX / GLYCOLAX) 17 G PACKET    Take 17 g by mouth daily as needed for mild constipation.   POTASSIUM CHLORIDE (KLOR-CON) 10 MEQ TABLET    Take 10 mEq by mouth daily.   PROMETHAZINE (PHENERGAN)  25 MG TABLET    Take 25 mg by mouth every 6 (six) hours as needed for nausea or vomiting.   SENNA (SENOKOT) 8.6 MG TABS TABLET    Take 1 tablet (8.6 mg total) by mouth 2 (two) times daily.   TIZANIDINE (ZANAFLEX) 2 MG TABLET    SMARTSIG:1 Tablet(s) By Mouth Every 12 Hours PRN   XARELTO 20 MG TABS TABLET    Take 20 mg by mouth at bedtime.  Modified Medications   No medications on file  Discontinued Medications   METHOCARBAMOL (ROBAXIN) 500 MG TABLET    Take 1 tablet (500 mg total) by mouth every 8 (eight) hours as needed for muscle spasms.      Past Medical History:  Diagnosis Date   Anemia    Anxiety    Arthritis    CAD (coronary artery disease)    COPD (chronic obstructive pulmonary disease)    Depression    Dyspnea    Fibromyalgia    GERD (gastroesophageal reflux disease)    Hiatal hernia    History of kidney stones    Migraine headache    Multinodular goiter    Nephrolithiasis    Pneumonia    Pulmonary embolism 1996   Sleep apnea    Thyroid nodule     Social History   Tobacco Use   Smoking status: Former    Packs/day: 0.50    Years: 10.00    Additional pack years: 0.00    Total pack years: 5.00    Types: Cigarettes    Quit date: 02/12/2021    Years since quitting: 2.1   Smokeless tobacco: Never  Vaping Use   Vaping Use: Never used  Substance Use Topics   Alcohol use: Not Currently   Drug use: Not Currently    Family History  Problem Relation Age of Onset   Hypertension Mother     Allergies  Allergen Reactions   Lyrica [Pregabalin] Other (See Comments)    Paralysis present (finding)- not tolerated Pt stated she could not move her body but she could think      Belbuca [Buprenorphine Hcl] Other (See Comments)    " Horrible headache"    Compazine [Prochlorperazine] Anxiety   Droperidol Anxiety   Ergotamine-Caffeine Anxiety and Hypertension    Increased systolic arterial pressure (finding)     Review of Systems  All other systems reviewed and are  negative.  Except as noted above.   OBJECTIVE:    There were no vitals filed for this visit. There is no height or weight on file to  calculate BMI.  Physical Exam Constitutional:      Comments: She is well appearing in the video and in no acute distress.  She is pleasant as always.       Labs and Microbiology:    Latest Ref Rng & Units 03/12/2023    4:22 AM 03/11/2023    6:40 AM 03/10/2023    7:33 AM  CBC  WBC 4.0 - 10.5 K/uL 14.7  19.7  8.8   Hemoglobin 12.0 - 15.0 g/dL 8.4  9.3  8.8   Hematocrit 36.0 - 46.0 % 27.4  28.7  29.2   Platelets 150 - 400 K/uL 357  375  328       Latest Ref Rng & Units 03/11/2023    6:40 AM 03/10/2023    4:12 AM 03/08/2023    2:55 PM  CMP  Glucose 70 - 99 mg/dL 161  92  95   BUN 8 - 23 mg/dL Creatinine 0.44 - 1.00 mg/dL 0.96  0.45  4.09   Sodium 135 - 145 mmol/L 136  142  139   Potassium 3.5 - 5.1 mmol/L 4.4  3.4  3.9   Chloride 98 - 111 mmol/L 102  107  102   CO2 22 - 32 mmol/L Calcium 8.9 - 10.3 mg/dL 7.7  8.6  8.7   Total Protein 6.5 - 8.1 g/dL  6.2  7.3   Total Bilirubin 0.3 - 1.2 mg/dL  0.4  0.7   Alkaline Phos 38 - 126 U/L  47  58   AST 15 - 41 U/L  19  27   ALT 0 - 44 U/L  12  14      No results found for this or any previous visit (from the past 240 hour(s)).  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  She is on Cefazolin 2gm q8h via PICC line through 04/21/23 at which time PICC line will plan to be removed.  Her acute worsening of knee pain the past 2 days is concerning.  I have asked her to call Dr Kathline Magic office and update them with her current situation to see if they recommend having her come in for another aspiration or evaluation.  Will have her follow up with [place] in another week with an update.     Vedia Coffer for Infectious Disease [facility] 04/07/2023, 10:58 AM  Virtual Visit via Video Note   I connected with [name] on 04/07/23 at 10:58 AM by video and verified that I am speaking with the correct person using two identifiers.   I discussed the limitations, risks, security and privacy concerns of performing an evaluation and management service by telephone and the availability of in person appointments. I also discussed with the patient that there may be a patient responsible charge related to this service. The patient expressed understanding and agreed to proceed.  Patient location: Home  My location: [facility] Duration of call: 9 min (20 min spent in chart)

## 2023-04-07 NOTE — Assessment & Plan Note (Signed)
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  She is on Cefazolin 2gm q8h via PICC line through 04/21/23 at which time PICC line will plan to be removed.  Her acute worsening of knee pain the past 2 days is concerning.  I have asked her to call Dr Kathline Magic office and update them with her current situation to see if they recommend having her come in for another aspiration or evaluation.  Will have her follow up with [place] in another week with an update.

## 2023-04-13 LAB — LAB REPORT - SCANNED: EGFR: 101

## 2023-04-14 ENCOUNTER — Telehealth: Payer: Self-pay

## 2023-04-14 ENCOUNTER — Other Ambulatory Visit: Payer: Self-pay

## 2023-04-14 ENCOUNTER — Ambulatory Visit (INDEPENDENT_AMBULATORY_CARE_PROVIDER_SITE_OTHER): Payer: Medicare Other | Admitting: Infectious Diseases

## 2023-04-14 ENCOUNTER — Encounter: Payer: Self-pay | Admitting: Infectious Diseases

## 2023-04-14 VITALS — BP 128/82 | HR 81 | Temp 98.2°F

## 2023-04-14 DIAGNOSIS — L304 Erythema intertrigo: Secondary | ICD-10-CM | POA: Diagnosis not present

## 2023-04-14 DIAGNOSIS — Z452 Encounter for adjustment and management of vascular access device: Secondary | ICD-10-CM

## 2023-04-14 DIAGNOSIS — T8454XD Infection and inflammatory reaction due to internal left knee prosthesis, subsequent encounter: Secondary | ICD-10-CM | POA: Diagnosis not present

## 2023-04-14 DIAGNOSIS — Z792 Long term (current) use of antibiotics: Secondary | ICD-10-CM | POA: Diagnosis not present

## 2023-04-14 DIAGNOSIS — Z96659 Presence of unspecified artificial knee joint: Secondary | ICD-10-CM

## 2023-04-14 MED ORDER — FLUCONAZOLE 200 MG PO TABS: 200.0000 mg | ORAL_TABLET | Freq: Every day | ORAL | 0 refills | Status: DC

## 2023-04-14 NOTE — Progress Notes (Signed)
Patient: Norma Anthony  DOB: Jan 05, 1956 MRN: [id number] PCP: [facility]     Subjective:  Norma Anthony is a 67 y.o. 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   She was admitted at [facility] from 3/25 - 03/16/23 for L TKR PJI. Underwent I&D, resection arthroplasty and placement of abx spacer on 03/10/23 after arthrocentesis sample grew MSSA. Intraoperative cultures also positive for MSSA. PICC line was placed and discharged on cefazolin 2 gm IV q8h x 6 weeks through 04/21/23. After acute hospital stay she required a short term placement at SNF but at the time of last assessment with Dr. Earlene Plater 4/24 she was residing at home.   At the video visit on 4/24 she reported acute worsening of knee pain and was asked to contact Dr. Kathline Magic office for this update. She saw him yesterday and there is no concern for ongoing infection. Has some scabs present on the incision line still that are dry that they said Dr. Linna Caprice wanted to heal up better before consideration of re-implantation of the knee. There is never any drainage from these scabs and all is dry.   She still has a lot of pain in the knee, rates it an 8/10.  She stays in the hospital bed at home nearly 100% of the time. She is eating better than when she was at the nursing home.   No trouble with her PICC line - has 1 week left on her IV antibiotics.   She does have an itchy rash she has under her abdominal fold, upper back and perianal area. She states she had some "diaper rash" from the SNF stay and having to use the bathroom in bed and delayed clean up from staff. It is painful.    Review of Systems  Constitutional:  Negative for chills and fever.  Gastrointestinal:  Positive for nausea.  All other systems reviewed and are negative.    Past Medical History:  Diagnosis Date   Anemia    Anxiety    Arthritis    CAD (coronary artery disease)    COPD (chronic obstructive pulmonary disease)  (HCC)    Depression    Dyspnea    Fibromyalgia    GERD (gastroesophageal reflux disease)    Hiatal hernia    History of kidney stones    Migraine headache    Multinodular goiter    Nephrolithiasis    Pneumonia    Pulmonary embolism (HCC) 1996   Sleep apnea    Thyroid nodule     Outpatient Medications Prior to Visit  Medication Sig Dispense Refill   albuterol (VENTOLIN HFA) 108 (90 Base) MCG/ACT inhaler Inhale 2 puffs into the lungs every 6 (six) hours as needed for wheezing or shortness of breath.     ALPRAZolam (XANAX) 1 MG tablet Take 1 tablet (1 mg total) by mouth every 6 (six) hours as needed for anxiety or sleep. 30 tablet 0   b complex vitamins capsule Take 1 capsule by mouth daily.     ceFAZolin (ANCEF) IVPB Inject 2 g into the vein every 8 (eight) hours. Indication:  MSSA L-knee PJI First Dose: Yes Last Day of Therapy:  04/22/23 Labs - Once weekly:  CBC/D and BMP, Labs - Every other week:  ESR and CRP Method of administration: IV Push Pull PICC line after completion of IV therapy Method of administration may be changed at the discretion of home infusion pharmacist based upon assessment of  the patient and/or caregiver's ability to self-administer the medication ordered. 126 Units 0   clindamycin-benzoyl peroxide (BENZACLIN) gel Apply 1 Application topically 2 (two) times daily as needed (for acne breakouts).     clotrimazole-betamethasone (LOTRISONE) cream Apply 1 Application topically 2 (two) times daily as needed (for irritation under the breasts or abdominal folds).     diphenhydrAMINE (SOMINEX) 25 MG tablet Take 50-75 mg by mouth daily as needed for allergies or sleep.     DULoxetine (CYMBALTA) 60 MG capsule Take 60 mg by mouth at bedtime.     famotidine (PEPCID) 20 MG tablet TAKE 1 TABLET BY MOUTH AFTER SUPPER 30 tablet 0   Ferrous Fumarate-Folic Acid 324-1 MG TABS Take 1 tablet by mouth daily. Hematinic     fluticasone (FLONASE) 50 MCG/ACT nasal spray Place 1 spray into  both nostrils daily as needed for allergies or rhinitis.     gabapentin (NEURONTIN) 400 MG capsule Take 400 mg by mouth at bedtime.     HYDROmorphone (DILAUDID) 2 MG tablet Take 1 tablet (2 mg total) by mouth every 4 (four) hours as needed for severe pain. 42 tablet 0   melatonin 5 MG TABS Take 10 mg by mouth at bedtime.     naloxone (NARCAN) nasal spray 4 mg/0.1 mL Place 1 spray into the nose once as needed (accidental over dose).     ondansetron (ZOFRAN) 4 MG tablet Take 1 tablet (4 mg total) by mouth every 8 (eight) hours as needed for nausea or vomiting. 30 tablet 0   pantoprazole (PROTONIX) 40 MG tablet Take 40 mg by mouth daily with breakfast.     polyethylene glycol (MIRALAX / GLYCOLAX) 17 g packet Take 17 g by mouth daily as needed for mild constipation. 14 each 0   potassium chloride (KLOR-CON) 10 MEQ tablet Take 10 mEq by mouth daily.     promethazine (PHENERGAN) 25 MG tablet Take 25 mg by mouth every 6 (six) hours as needed for nausea or vomiting.     tiZANidine (ZANAFLEX) 2 MG tablet SMARTSIG:1 Tablet(s) By Mouth Every 12 Hours PRN     XARELTO 20 MG TABS tablet Take 20 mg by mouth at bedtime.     Multiple Vitamins-Minerals (HAIR SKIN AND NAILS FORMULA) TABS Take 1 tablet by mouth daily. (Patient not taking: Reported on 04/14/2023)     senna (SENOKOT) 8.6 MG TABS tablet Take 1 tablet (8.6 mg total) by mouth 2 (two) times daily. 60 tablet 0   No facility-administered medications prior to visit.     Allergies  Allergen Reactions   Lyrica [Pregabalin] Other (See Comments)    Paralysis present (finding)- not tolerated Pt stated she could not move her body but she could think      Belbuca [Buprenorphine Hcl] Other (See Comments)    " Horrible headache"    Compazine [Prochlorperazine] Anxiety   Droperidol Anxiety   Ergotamine-Caffeine Anxiety and Hypertension    Increased systolic arterial pressure (finding)      Objective:   Vitals:   04/14/23 1445  BP: 128/82  Pulse: 81   Temp: 98.2 F (36.8 C)  TempSrc: Temporal  SpO2: 95%   There is no height or weight on file to calculate BMI.  Physical Exam Vitals reviewed.  Constitutional:      Appearance: Normal appearance. She is not ill-appearing.  Cardiovascular:     Rate and Rhythm: Normal rate and regular rhythm.  Abdominal:     General: There is no distension.  Palpations: Abdomen is soft.  Genitourinary:    Comments: Unable to examine - reports an excoriated red rash under abdominal fold and perianal area  Musculoskeletal:        General: No swelling.  Skin:    General: Skin is warm and dry.     Capillary Refill: Capillary refill takes less than 2 seconds.     Findings: No erythema. Rash: scabs as pictured. Neurological:     Mental Status: She is alert and oriented to person, place, and time.       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 There are a few areas of scabs that are dry appearing. There is no erythema and skin is all normal temperature. Her inflammatory markers have normalized with ESR 35 and CRP 3 as of most recent blood draw within the week. Will continue with plan for 6 weeks IV cefazolin EOT 04/21/23.  I will reach out to Dr. Linna Caprice to see if he feels like her incisions have healed up enough to consider re-implant 2 weeks after stopping antibiotics (after May 22).  May benefit from oral antibiotics for a short period conservatively until incisions heal up better. Will see what Dr. Linna Caprice is most comfortable with. She understands this will delay re-implantation if we approach it that way.  I recommend she continue the Juven protein supplements and consider adding zinc 100 mg and vitamin c 1000 mg once daily to supplement wound healing as well.   FU in 3 weeks virtually to see how things are going.       Relevant Medications   fluconazole (DIFLUCAN) 200 MG tablet   PICC (peripherally inserted central catheter) in place    Site unremarkable, well maintained and functioning as expected. Continue care and maintenance until planned end of IV antibiotics. Home Health team to remove at completion on 04/21/23.       Long term current use of antibiotics    All OPAT lab work reviewed and within normal limits.  ESR normalized 35 CRP normalized 3 mg/L      Intertriginous dermatitis associated with moisture    Based on her description I suspect she may have candidial infection as well as moisture associated damage. Continue Destin topically. Keep urine/stool off skin as best as possible.  Start diflucan 200 mg daily x 7d.        Rexene Alberts, MSN, NP-C [facility] Pager: [phone] Office: [phone]  04/14/23  4:34 PM

## 2023-04-14 NOTE — Assessment & Plan Note (Signed)
All OPAT lab work reviewed and within normal limits.  ESR normalized 35 CRP normalized 3 mg/L

## 2023-04-14 NOTE — Patient Instructions (Addendum)
Nice to meet you! I will update Dr. Earlene Plater and Dr. Linna Caprice with today's visits.  My question for Dr. Linna Caprice is whether he would like to keep you on some oral antibiotics after we finish the IV on may 8th until your skin heals a little more.   Would continue the Juven supplements to help with wound healing   Would add zinc supplement and vitamin c supplement as well once a day.  Would try to do 100 mg zinc and 1000 mg of vitamin c.  Take these with food as they can cause some nausea.   Would use diflucan (fluconazole) for the rash you have. I suspect some of it may be yeast. Continue the Destin / A&D ointment.   Please schedule a follow up televisit in 3 weeks after your appointment with Dr. Kathie Rhodes so we can touch base again how you are doing. If you can send updates of your knee incision prior to that appointment via your MyChart that would be immensely helpful!  I would wait at least 2 weeks after stopping any antibiotics before considering re-implantation of the knee. We can check some blood work at that point and see where we are. Sometimes surgeons will also take a sample of the fluid in the knee as well to ensure no infection concerns inside the knee before re-implantation.

## 2023-04-14 NOTE — Telephone Encounter (Signed)
Okay to pull PICC after last dose on 5/8 per Rexene Alberts, NP. Orders sent to Jeri Modena, RN with [employer].  Sandie Ano, RN

## 2023-04-14 NOTE — Assessment & Plan Note (Signed)
Based on her description I suspect she may have candidial infection as well as moisture associated damage. Continue Destin topically. Keep urine/stool off skin as best as possible.  Start diflucan 200 mg daily x 7d.

## 2023-04-14 NOTE — Assessment & Plan Note (Signed)
Her knee looks like it is healing up nicely. There are a few areas of scabs that are dry appearing. There is no erythema and skin is all normal temperature. Her inflammatory markers have normalized with ESR 35 and CRP 3 as of most recent blood draw within the week. Will continue with plan for 6 weeks IV cefazolin EOT 04/21/23.  I will reach out to Dr. Linna Caprice to see if he feels like her incisions have healed up enough to consider re-implant 2 weeks after stopping antibiotics (after May 22). May benefit from oral antibiotics for a short period conservatively until incisions heal up better. Will see what Dr. Linna Caprice is most comfortable with. She understands this will delay re-implantation if we approach it that way.  I recommend she continue the Juven protein supplements and consider adding zinc 100 mg and vitamin c 1000 mg once daily to supplement wound healing as well.   FU in 3 weeks virtually to see how things are going.

## 2023-04-14 NOTE — Assessment & Plan Note (Signed)
Site unremarkable, well maintained and functioning as expected. Continue care and maintenance until planned end of IV antibiotics. Home Health team to remove at completion on 04/21/23.

## 2023-04-15 MED ORDER — CEFADROXIL 500 MG PO CAPS: 1000.0000 mg | ORAL_CAPSULE | Freq: Two times a day (BID) | ORAL | 0 refills | Status: AC

## 2023-04-15 NOTE — Addendum Note (Signed)
Addended by: Blanchard Kelch on: 04/15/2023 10:58 AM   Modules accepted: Orders

## 2023-04-15 NOTE — Progress Notes (Signed)
ADDENDUM:   I spoke with Dr. Veda Canning and he agrees to continue oral antibiotics until her incision has healed up a bit more.  Will send in 14m supply of cefadroxil 500 mg tabs - 1000 mg (2 tabs) BID until follow up with Dr. Veda Canning on 5/22.   I sent [facility] a mychart message with recommendations. Can refill her fluconazole as well until she stops abx

## 2023-04-26 IMAGING — DX DG CHEST 2V
2 series · 2 of 2 positions shown · non-contrast
Comparison: 03/22/2022

CLINICAL DATA: Cough and shortness of breath.  COPD.

EXAM:
CHEST - 2 VIEW

[chest lat]
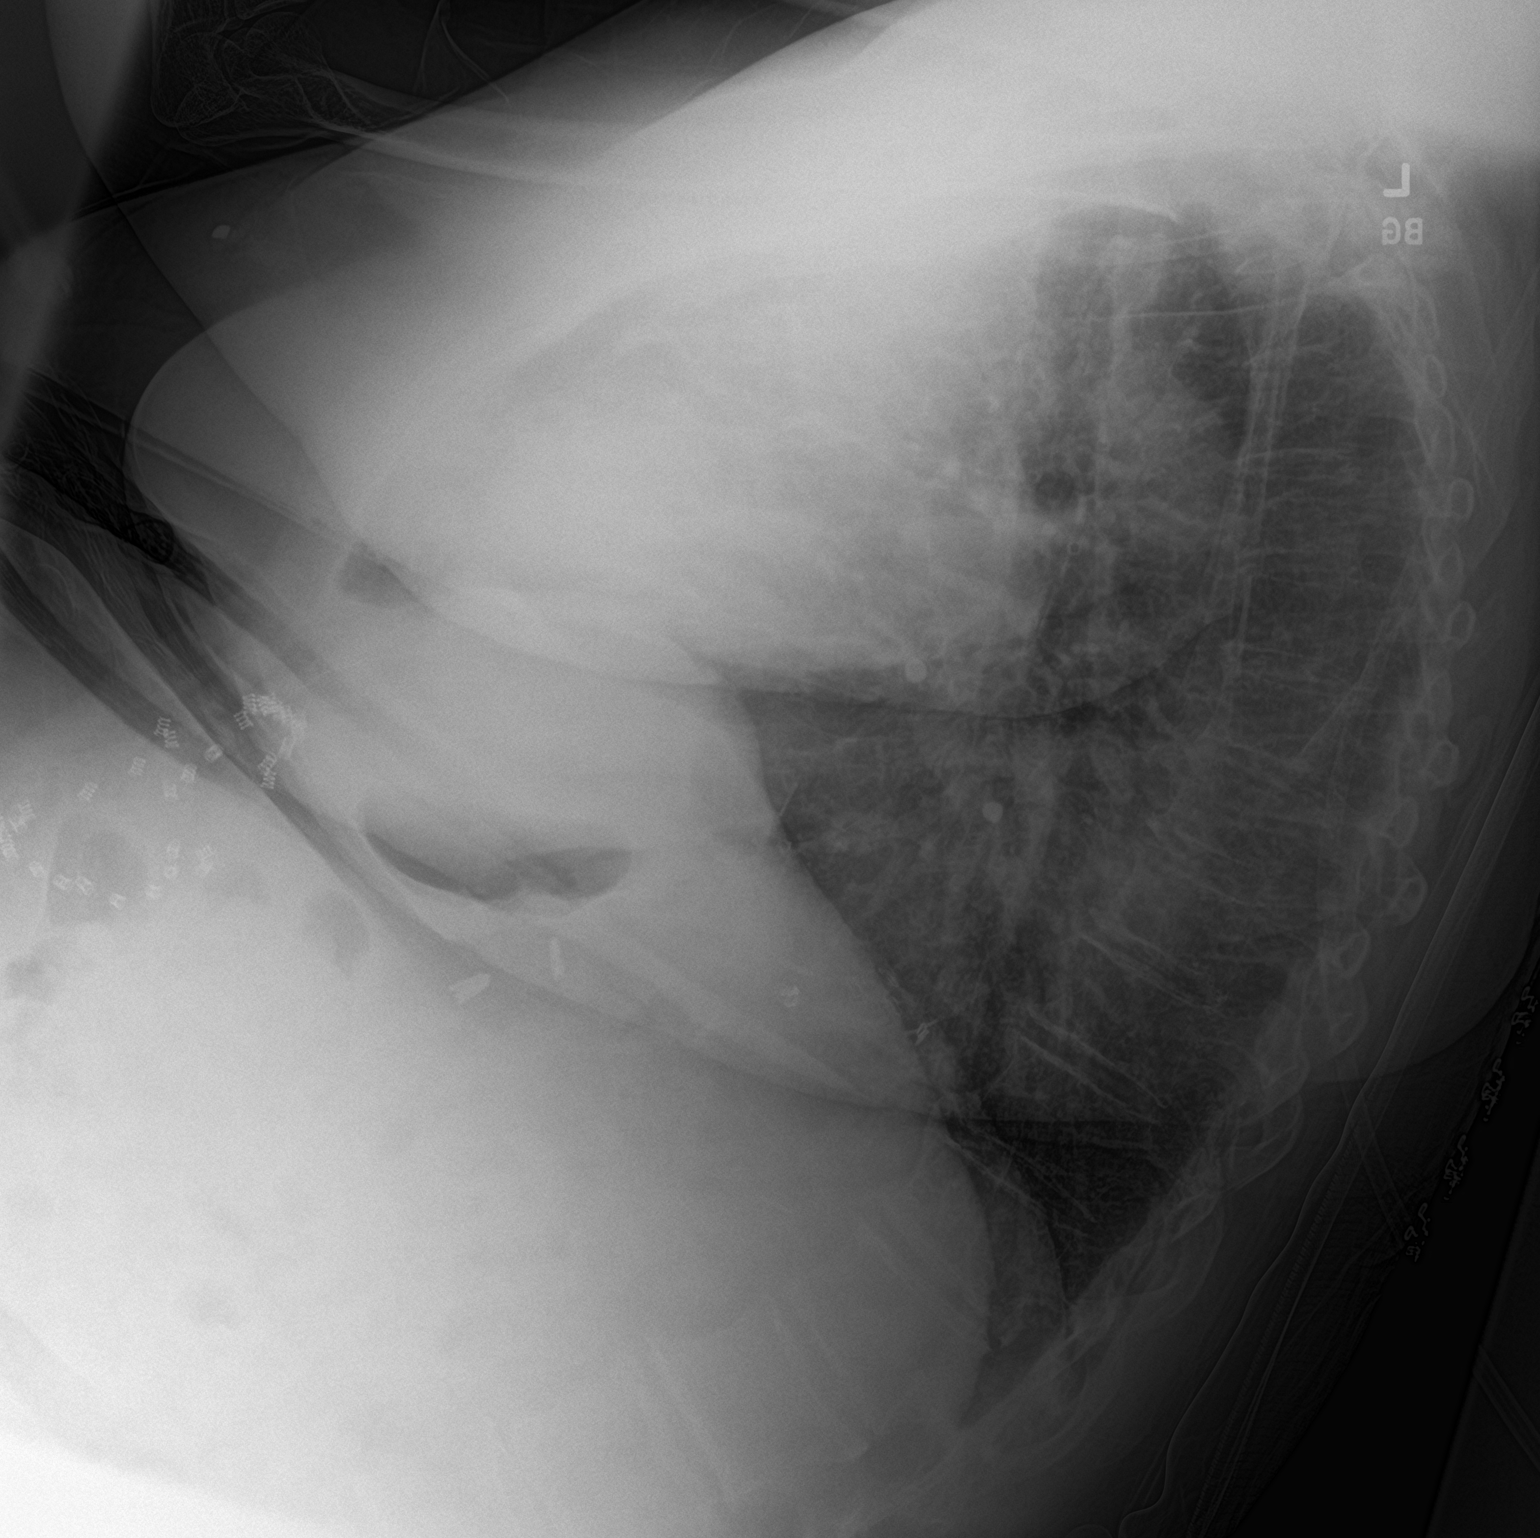

[chest ap]
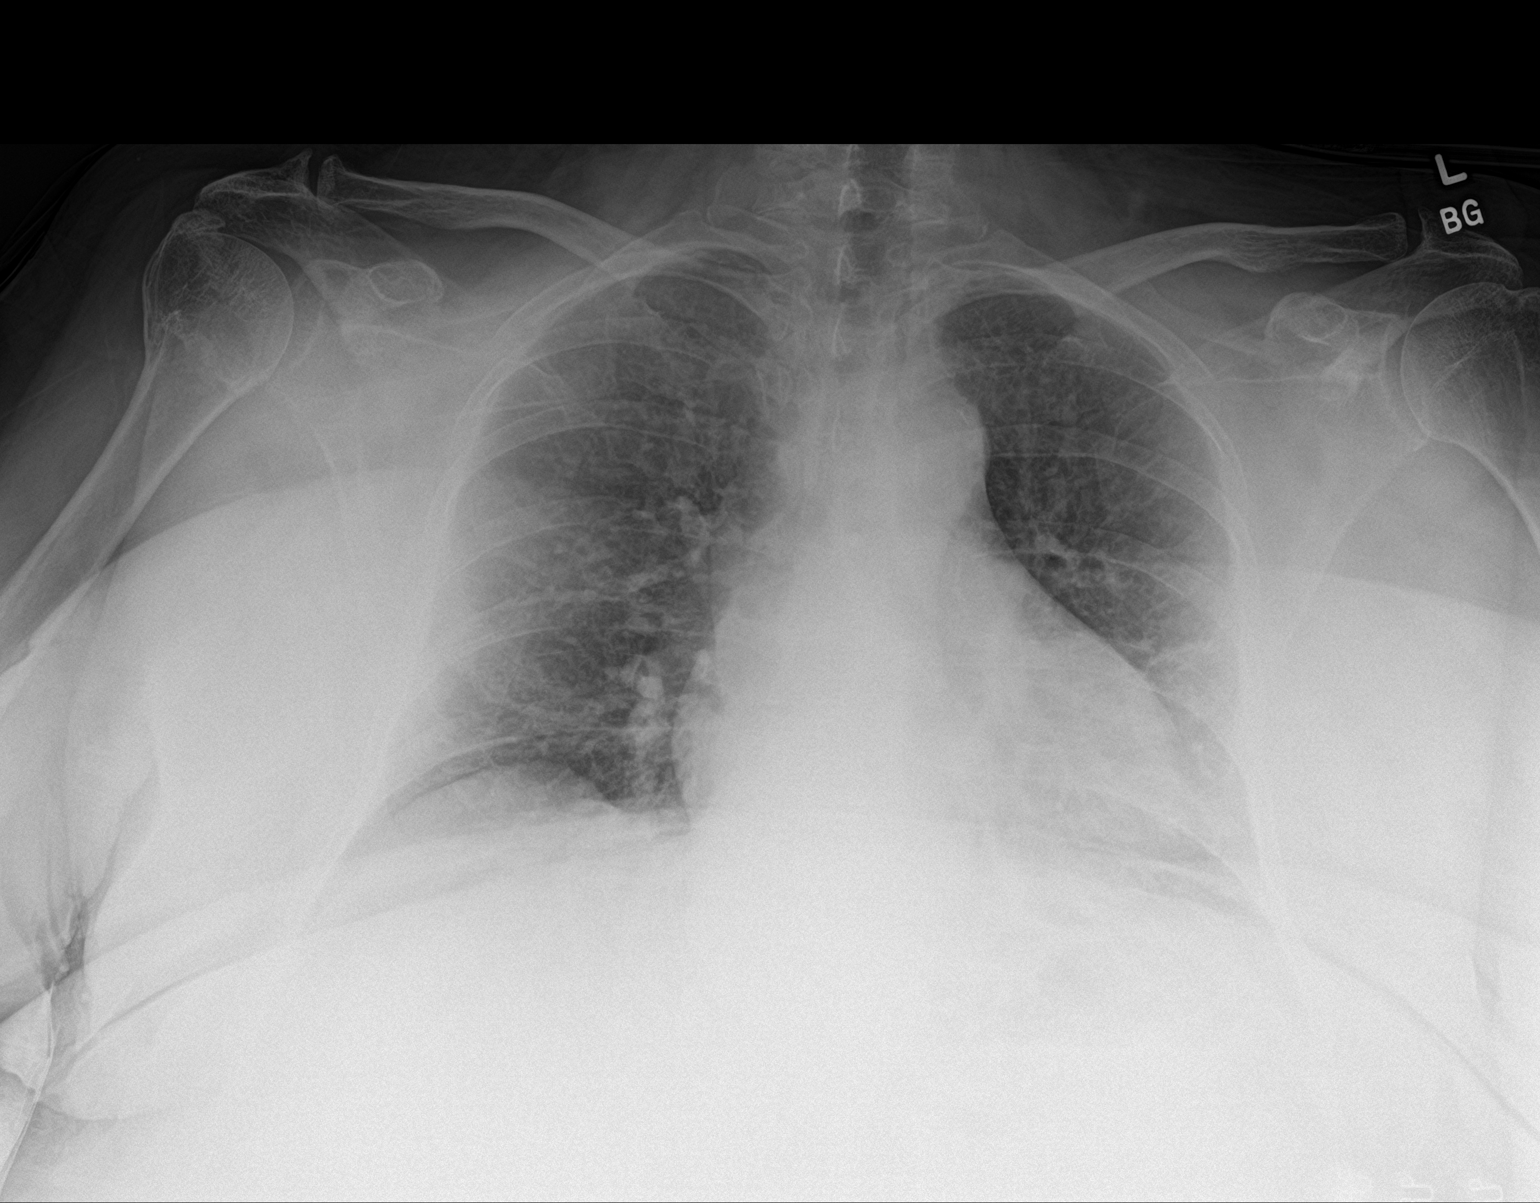

[2 of 2 positions shown; findings below may reference images not displayed]

FINDINGS: The heart size and mediastinal contours are within normal limits.
Mild scarring again seen in the left midlung. No evidence of acute
infiltrate or pleural effusion.
IMPRESSION: No active cardiopulmonary disease.

## 2023-04-26 IMAGING — CT CT RENAL STONE PROTOCOL
2 of 4 series · 16 of 46 positions shown, 18 images · non-contrast
Comparison: None Available.

CLINICAL DATA: Flank pain and hematuria for 2 weeks.
Nephrolithiasis.



[Series 2: axial st · axial · 0.85mm/px · z∈[+820,+1285]mm · 13 of 107 slices shown, 15 images]
[im 7/107  soft-tissue]
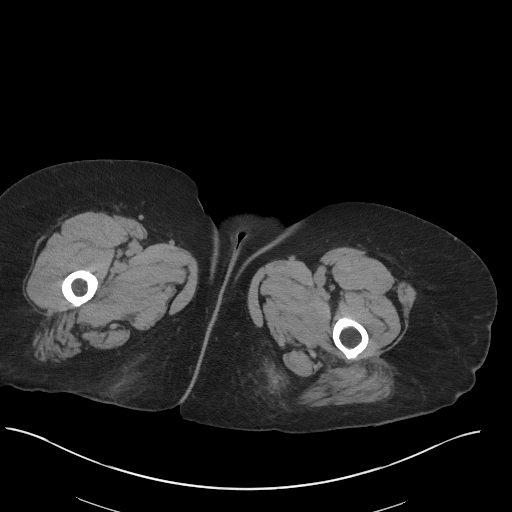
[im 7/107  bone]
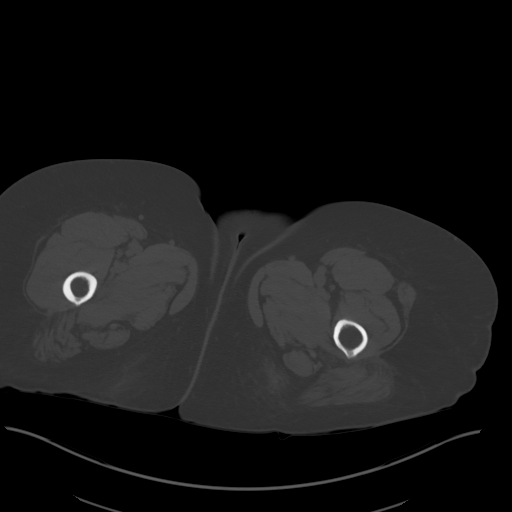
[im 14/107  soft-tissue]
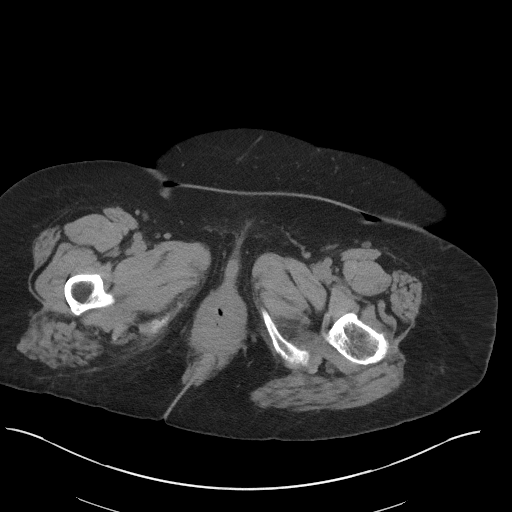
[im 20/107  soft-tissue]
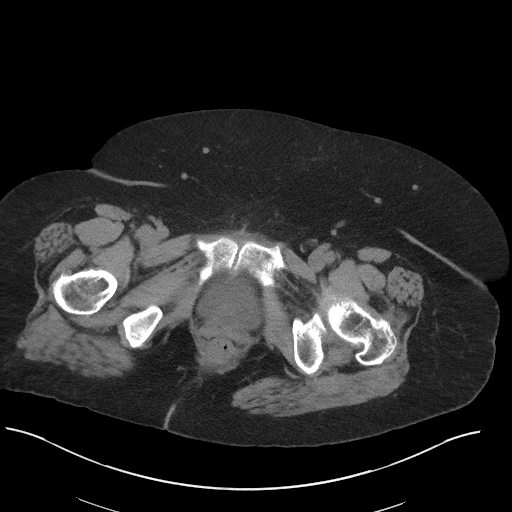
[im 34/107  soft-tissue]
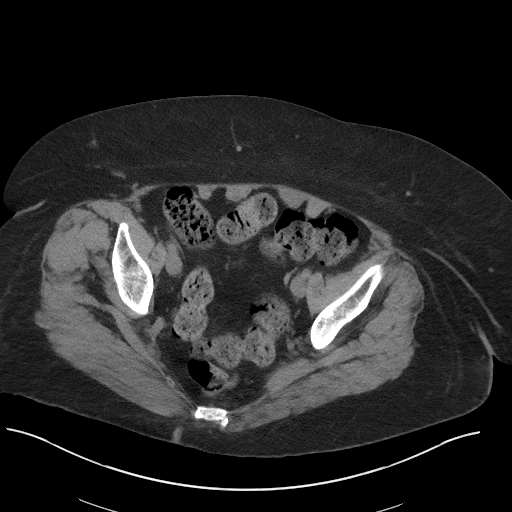
[im 40/107  soft-tissue]
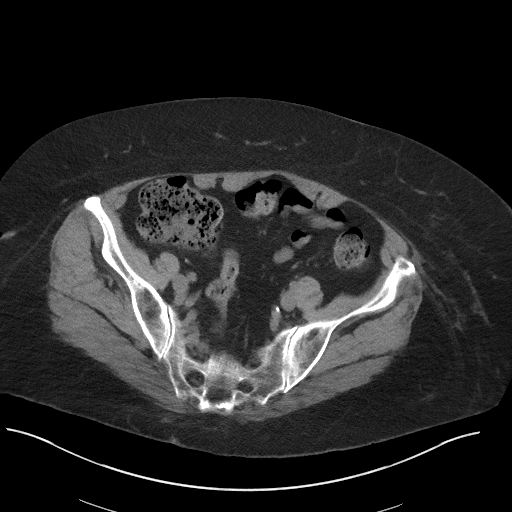
[im 47/107  soft-tissue]
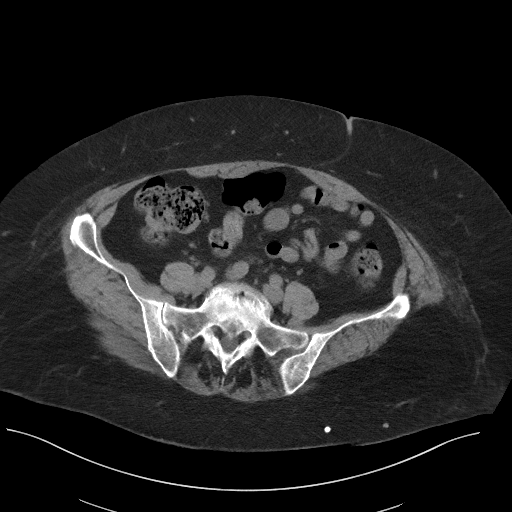
[im 54/107  soft-tissue]
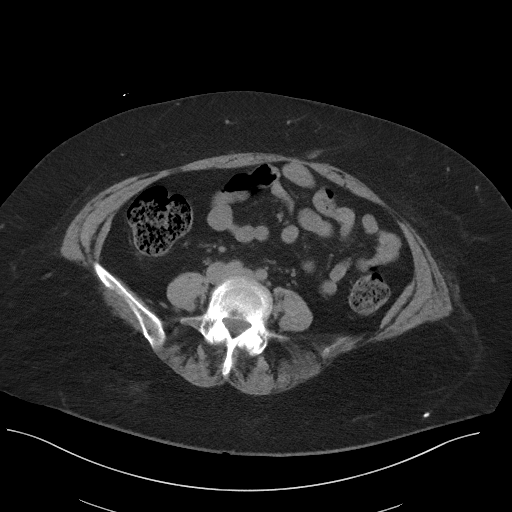
[im 60/107  soft-tissue]
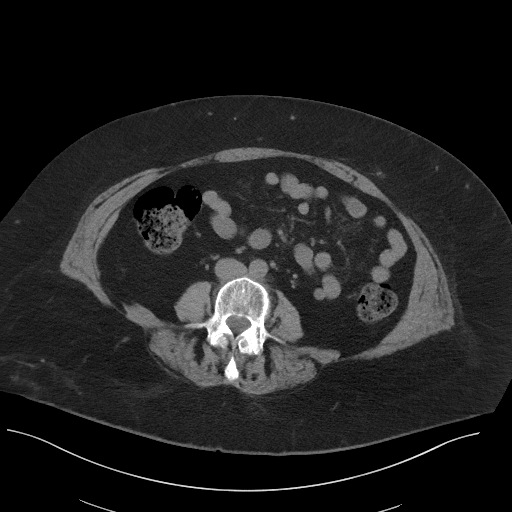
[im 67/107  soft-tissue]
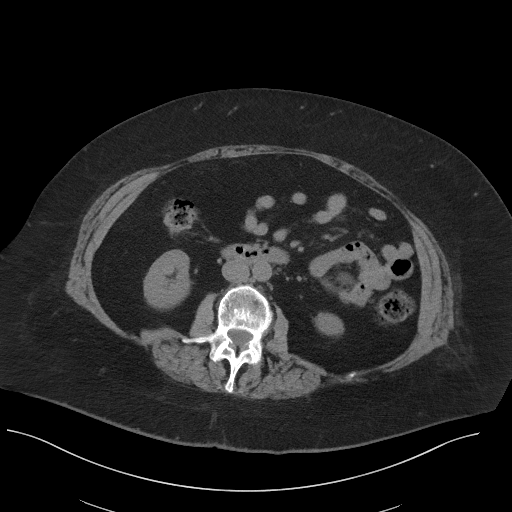
[im 67/107  bone]
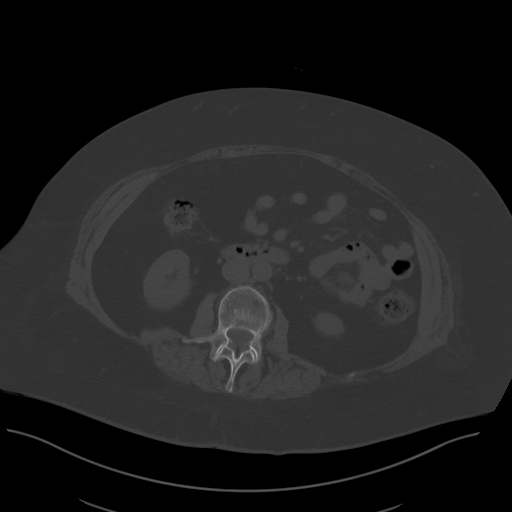
[im 73/107  soft-tissue]
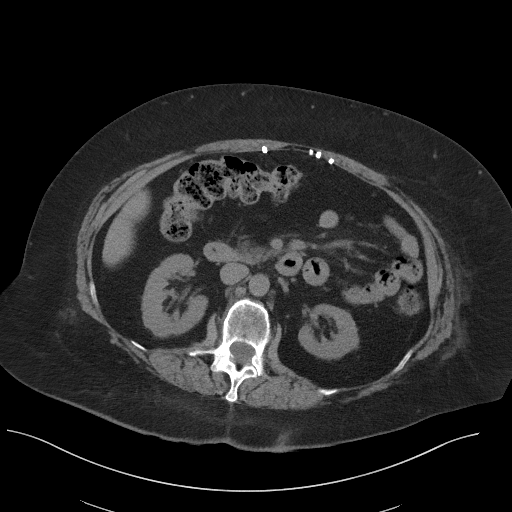
[im 87/107  soft-tissue]
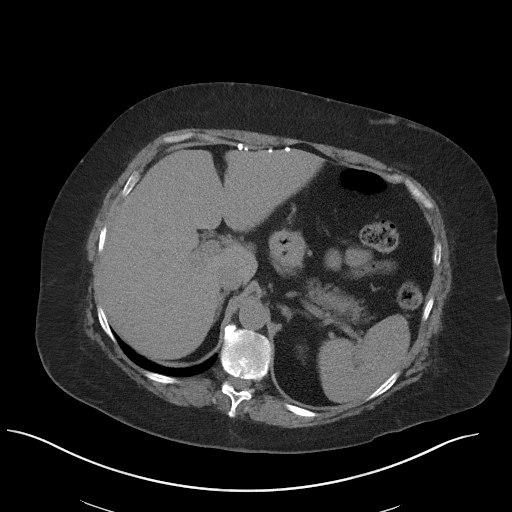
[im 93/107  soft-tissue]
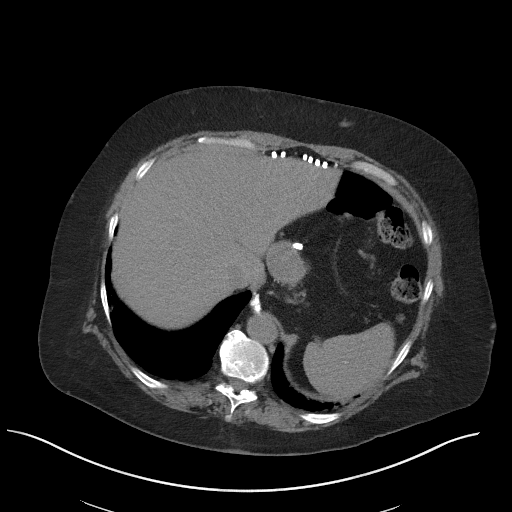
[im 100/107  soft-tissue]
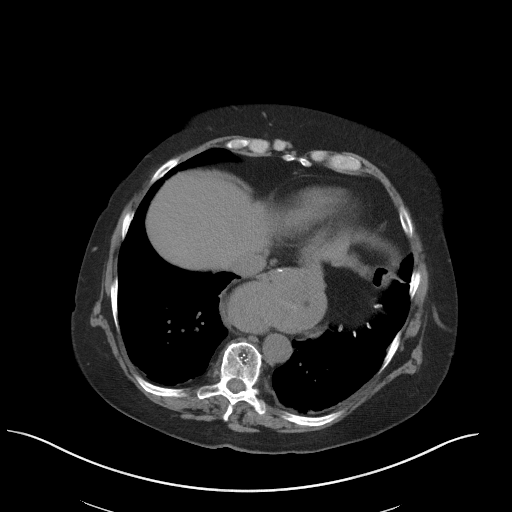

[Series 5: coronal st · coronal · 0.97mm/px · 3 of 110 slices shown]
[im 37/110  soft-tissue]
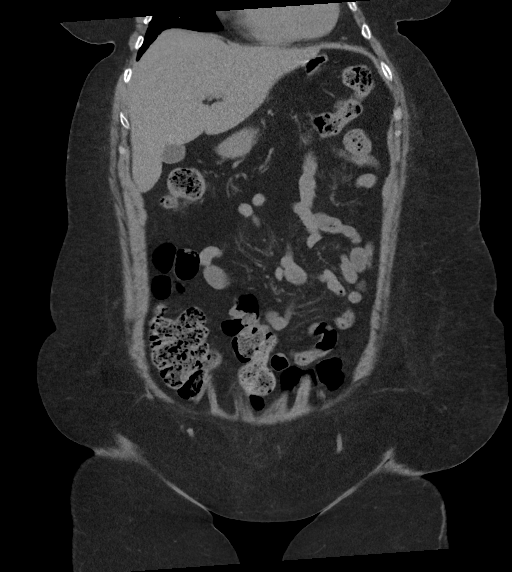
[im 49/110  soft-tissue]
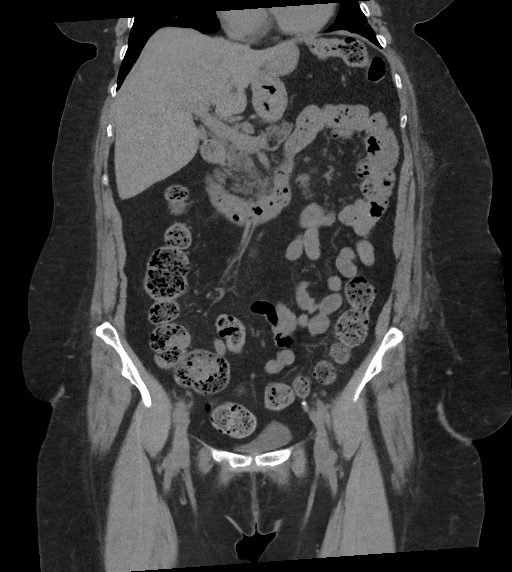
[im 61/110  soft-tissue]
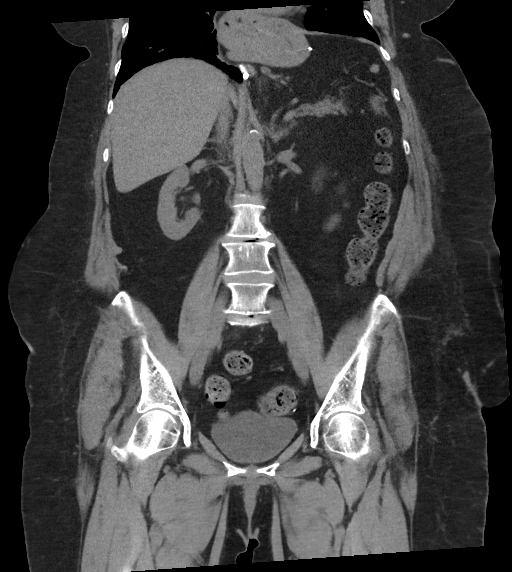

[16 of 46 positions shown; findings below may reference images not displayed]

FINDINGS: Lower chest: No acute findings.

Hepatobiliary: No mass visualized on this unenhanced exam.
Gallbladder is unremarkable. No evidence of biliary ductal
dilatation.

Pancreas: No mass or inflammatory process visualized on this
unenhanced exam.

Spleen:  Within normal limits in size.

Adrenals/Urinary tract: A few tiny less than 5 mm right renal
calculi are seen. No evidence of ureteral calculi or hydronephrosis.
Unremarkable unopacified urinary bladder.

Stomach/Bowel: Large hiatal hernia is seen. No evidence of
obstruction, inflammatory process, or abnormal fluid collections.
Surgical mesh seen in the anterior abdominal wall soft tissues. No
evidence of recurrent ventral hernia.

Vascular/Lymphatic: No pathologically enlarged lymph nodes
identified. No evidence of abdominal aortic aneurysm. Aortic
atherosclerotic calcification noted.

Reproductive: Prior hysterectomy noted. Adnexal regions are
unremarkable in appearance.

Other:  None.

Musculoskeletal:  No suspicious bone lesions identified.
IMPRESSION: Tiny right renal calculi. No evidence of ureteral calculi,
hydronephrosis, or other acute findings.

Large hiatal hernia.

## 2023-05-13 ENCOUNTER — Ambulatory Visit (INDEPENDENT_AMBULATORY_CARE_PROVIDER_SITE_OTHER): Payer: Medicare Other | Admitting: Infectious Diseases

## 2023-05-13 ENCOUNTER — Other Ambulatory Visit: Payer: Self-pay

## 2023-05-13 DIAGNOSIS — Z792 Long term (current) use of antibiotics: Secondary | ICD-10-CM

## 2023-05-13 DIAGNOSIS — Z96659 Presence of unspecified artificial knee joint: Secondary | ICD-10-CM

## 2023-05-13 DIAGNOSIS — T8454XD Infection and inflammatory reaction due to internal left knee prosthesis, subsequent encounter: Secondary | ICD-10-CM | POA: Diagnosis not present

## 2023-05-13 NOTE — Assessment & Plan Note (Addendum)
S/p arthroplasty resection, IV cefazolin through 04/21/23 and oral cefadroxil to complete an additional 3 weeks given concern for open areas on incision. These seem to have healed up nicely based on her report. Dr. Veda Canning has a FU with her again in a few more weeks. I will have her email me a photo of her incision, plan to have her stop and monitor off abx. This will put her at least 2 weeks off at the time her next appt with Dr. Linna Caprice 6/18. Will see if he can have ESR/CRP drawn at the office and consideration of repeat arthrocentesis for cell count and culture.   It is not typically in ID practice to put patients on antibiotics after re-implant, however there is some small study data that suggests it may help higher risk individuals; I would not be opposed to giving her a course of oral cefadroxil post implant given dehiscence history, slow wound healing and BMI of 40. Will also plan for decolonization protocol with mupirocin BID and CHG washes for 5d prior to re-implant procedure.    She is not doing much at home from therapy stand point d/t discomfort with the spacer in place. Encouraged her to continue doing what she can given she has an other surgery to prepare for - would continue zinc + vitamin C along with Juven supplement for wound healing.

## 2023-05-13 NOTE — Progress Notes (Signed)
Patient: Norma Anthony  DOB: Nov 09, 1956 MRN: [id number] PCP: [facility]   VIRTUAL CARE ENCOUNTER  I connected with [name] on 05/13/23 at 10:30 AM EDT by telephone and verified that I am speaking with the correct person using two identifiers.   I discussed the limitations, risks, security and privacy concerns of performing an evaluation and management service by telephone and the availability of in person appointments. I also discussed with the patient that there may be a patient responsible charge related to this service. The patient expressed understanding and agreed to proceed.  Patient Location: [place] residence   Other Participants: Husband, Museum/gallery conservator Location: [facility] Office    Subjective:  Norma Anthony is a 67 y.o. 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   She was admitted at [facility] from 3/25 - 03/16/23 for L TKR PJI. Underwent I&D, resection arthroplasty and placement of abx spacer on 03/10/23 after arthrocentesis sample grew MSSA. Intraoperative cultures also positive for MSSA. PICC line was placed and discharged on cefazolin 2 gm IV q8h x 6 weeks through 04/21/23. At follow up we continued oral cephalexin for an additional few weeks out of caution with unhealed incision and multiple scabs present on her incision.   She is frustrated with her knee.  She saw Dr. Veda Canning last week and feels like she was told she needs to wait for another 8 weeks before consideration of re-implantation. She continues on the cefadroxil 2x daily and does not have any concern for side effects.  She says her incision looks very good - has had several scabs fall off. One left in place but overall good. She still has trouble with pain in the knee joint.  She is going back to see Dr. Linna Caprice in 4 weeks June 18th.  She stays in bed most of the time during the day and does not do much else. She is getting very depressed with this infection but understands the  severity and consideration of amputation is a possibility should this fail.    Review of Systems  Constitutional:  Negative for chills and fever.  Gastrointestinal:  Negative for nausea.  All other systems reviewed and are negative.    Past Medical History:  Diagnosis Date   Anemia    Anxiety    Arthritis    CAD (coronary artery disease)    COPD (chronic obstructive pulmonary disease) (HCC)    Depression    Dyspnea    Fibromyalgia    GERD (gastroesophageal reflux disease)    Hiatal hernia    History of kidney stones    Migraine headache    Multinodular goiter    Nephrolithiasis    Pneumonia    Pulmonary embolism (HCC) 1996   Sleep apnea    Thyroid nodule     Outpatient Medications Prior to Visit  Medication Sig Dispense Refill   albuterol (VENTOLIN HFA) 108 (90 Base) MCG/ACT inhaler Inhale 2 puffs into the lungs every 6 (six) hours as needed for wheezing or shortness of breath.     ALPRAZolam (XANAX) 1 MG tablet Take 1 tablet (1 mg total) by mouth every 6 (six) hours as needed for anxiety or sleep. 30 tablet 0   b complex vitamins capsule Take 1 capsule by mouth daily.     cefadroxil (DURICEF) 500 MG capsule Take 2 capsules (1,000 mg total) by mouth 2 (two) times daily. 120 capsule 0   clotrimazole-betamethasone (LOTRISONE) cream Apply 1 Application  topically 2 (two) times daily as needed (for irritation under the breasts or abdominal folds).     diphenhydrAMINE (SOMINEX) 25 MG tablet Take 50-75 mg by mouth daily as needed for allergies or sleep.     DULoxetine (CYMBALTA) 60 MG capsule Take 60 mg by mouth at bedtime.     famotidine (PEPCID) 20 MG tablet TAKE 1 TABLET BY MOUTH AFTER SUPPER 30 tablet 0   Ferrous Fumarate-Folic Acid 324-1 MG TABS Take 1 tablet by mouth daily. Hematinic     gabapentin (NEURONTIN) 400 MG capsule Take 400 mg by mouth at bedtime.     melatonin 5 MG TABS Take 10 mg by mouth at bedtime.     oxyCODONE-acetaminophen (PERCOCET) 7.5-325 MG tablet Take  1 tablet by mouth every 4 (four) hours as needed.     pantoprazole (PROTONIX) 40 MG tablet Take 40 mg by mouth daily with breakfast.     promethazine (PHENERGAN) 25 MG tablet Take 25 mg by mouth every 6 (six) hours as needed for nausea or vomiting.     tiZANidine (ZANAFLEX) 2 MG tablet SMARTSIG:1 Tablet(s) By Mouth Every 12 Hours PRN     XARELTO 20 MG TABS tablet Take 20 mg by mouth at bedtime.     clindamycin-benzoyl peroxide (BENZACLIN) gel Apply 1 Application topically 2 (two) times daily as needed (for acne breakouts). (Patient not taking: Reported on 05/13/2023)     fluconazole (DIFLUCAN) 200 MG tablet Take 1 tablet (200 mg total) by mouth daily. (Patient not taking: Reported on 05/13/2023) 7 tablet 0   fluticasone (FLONASE) 50 MCG/ACT nasal spray Place 1 spray into both nostrils daily as needed for allergies or rhinitis. (Patient not taking: Reported on 05/13/2023)     HYDROmorphone (DILAUDID) 2 MG tablet Take 1 tablet (2 mg total) by mouth every 4 (four) hours as needed for severe pain. (Patient not taking: Reported on 05/13/2023) 42 tablet 0   Multiple Vitamins-Minerals (HAIR SKIN AND NAILS FORMULA) TABS Take 1 tablet by mouth daily. (Patient not taking: Reported on 05/13/2023)     naloxone [facility]) nasal spray 4 mg/0.1 mL Place 1 spray into the nose once as needed (accidental over dose). (Patient not taking: Reported on 05/13/2023)     ondansetron (ZOFRAN) 4 MG tablet Take 1 tablet (4 mg total) by mouth every 8 (eight) hours as needed for nausea or vomiting. (Patient not taking: Reported on 05/13/2023) 30 tablet 0   polyethylene glycol (MIRALAX / GLYCOLAX) 17 g packet Take 17 g by mouth daily as needed for mild constipation. (Patient not taking: Reported on 05/13/2023) 14 each 0   potassium chloride (KLOR-CON) 10 MEQ tablet Take 10 mEq by mouth daily. (Patient not taking: Reported on 05/13/2023)     No facility-administered medications prior to visit.     Allergies  Allergen Reactions   Lyrica  [Pregabalin] Other (See Comments)    Paralysis present (finding)- not tolerated Pt stated she could not move her body but she could think      Belbuca [Buprenorphine Hcl] Other (See Comments)    " Horrible headache"    Compazine [Prochlorperazine] Anxiety   Droperidol Anxiety   Ergotamine-Caffeine Anxiety and Hypertension    Increased systolic arterial pressure (finding)      Objective:   There were no vitals filed for this visit.  There is no height or weight on file to calculate BMI.  Physical Exam Vitals reviewed.  Pulmonary:     Effort: Pulmonary effort is normal.  Comments: No shortness of breath detected in conversation.  Neurological:     Mental Status: She is oriented to person, place, and time.  Psychiatric:        Mood and Affect: Mood normal.        Behavior: Behavior normal.        Thought Content: Thought content normal.        Judgment: Judgment normal.      Lab Results: Lab Results  Component Value Date   WBC 14.7 (H) 03/12/2023   HGB 8.4 (L) 03/12/2023   HCT 27.4 (L) 03/12/2023   MCV 95.8 03/12/2023   PLT 357 03/12/2023    Lab Results  Component Value Date   CREATININE 0.56 03/11/2023   BUN 8 03/11/2023   NA 136 03/11/2023   K 4.4 03/11/2023   CL 102 03/11/2023   CO2 27 03/11/2023    Lab Results  Component Value Date   ALT 12 03/10/2023   AST 19 03/10/2023   ALKPHOS 47 03/10/2023   BILITOT 0.4 03/10/2023     Assessment & Plan:   Problem List Items Addressed This Visit       Unprioritized   Infected prosthetic knee joint (HCC)    S/p arthroplasty resection, IV cefazolin through 04/21/23 and oral cefadroxil to complete an additional 3 weeks given concern for open areas on incision. These seem to have healed up nicely based on her report. Dr. Veda Canning has a FU with her again in a few more weeks. I will have her email me a photo of her incision, plan to have her stop and monitor off abx. This will put her at least 2 weeks off at the  time her next appt with Dr. Linna Caprice 6/18. Will see if he can have ESR/CRP drawn at the office and consideration of repeat arthrocentesis for cell count and culture.   It is not typically in ID practice to put patients on antibiotics after re-implant, however there is some small study data that suggests it may help higher risk individuals; I would not be opposed to giving her a course of oral cefadroxil post implant given dehiscence history, slow wound healing and BMI of 40. Will also plan for decolonization protocol with mupirocin BID and CHG washes for 5d prior to re-implant procedure.    She is not doing much at home from therapy stand point d/t discomfort with the spacer in place. Encouraged her to continue doing what she can given she has an other surgery to prepare for - would continue zinc + vitamin C along with Juven supplement for wound healing.         Long term current use of antibiotics - Primary    Tolerating cefadroxil well without any side effects. Would like to get her a CRP / ESR at follow up with Dr. Linna Caprice for pre-op evaluation and consideration of re-implantation.        Follow Up Instructions: As above   I discussed the assessment and treatment plan with the patient. The patient was provided an opportunity to ask questions and all were answered. The patient agreed with the plan and demonstrated an understanding of the instructions.   The patient was advised to call back or seek an in-person evaluation if the symptoms worsen or if the condition fails to improve as anticipated.  I provided 24 minutes of non-face-to-face time during this encounter including telephone discussion and coordination of care with orthopedic surgery.    Rexene Alberts, MSN, NP-C [facility]  for Infectious Disease [facility]  [email or web address] Pager: [phone] Office: [phone] [facility] Main Line: [phone]   05/13/23  1:03 PM

## 2023-05-13 NOTE — Assessment & Plan Note (Signed)
Tolerating cefadroxil well without any side effects. Would like to get her a CRP / ESR at follow up with Dr. Linna Caprice for pre-op evaluation and consideration of re-implantation.

## 2023-05-14 ENCOUNTER — Other Ambulatory Visit: Payer: Self-pay | Admitting: Infectious Diseases

## 2023-05-14 NOTE — Telephone Encounter (Signed)
REFILL NEEDED

## 2023-05-27 ENCOUNTER — Telehealth: Payer: Self-pay

## 2023-05-27 NOTE — Telephone Encounter (Signed)
Patient called office to inform provider that the scab on her knee has come off. Wound his closed. Has not updaed Dr. Linna Caprice; advised she update him regarding this. Is scheduled to see him Tuesday.   Per Durwin Nora, NP agree with updating Dr. Linna Caprice. Will need to call office with surgery appointment. Not able to reach her when I called back.  Juanita Laster, RMA

## 2023-06-02 IMAGING — US US THYROID
1 series · 13 of 25 positions shown · non-contrast
Comparison: None Available.

CLINICAL DATA: Goiter.

EXAM:
THYROID ULTRASOUND
TECHNIQUE: Ultrasound examination of the thyroid gland and adjacent soft
tissues was performed.

[Series 1: us thyroid · 13 of 71 slices shown]
[im 1/71]
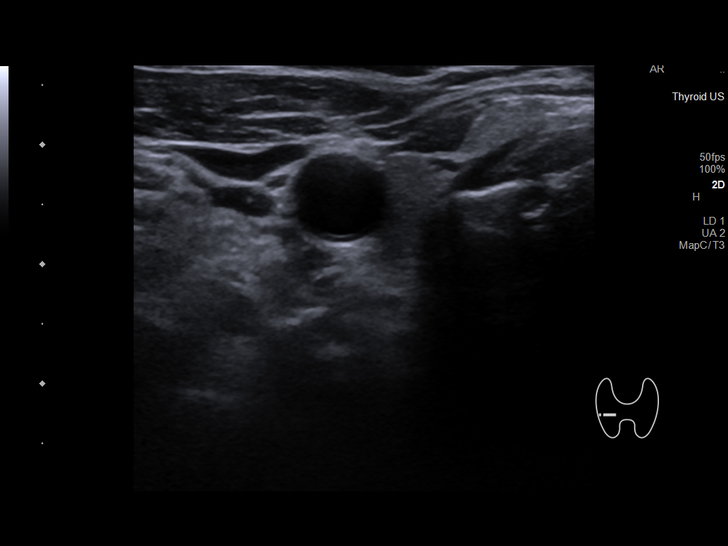
[im 6/71]
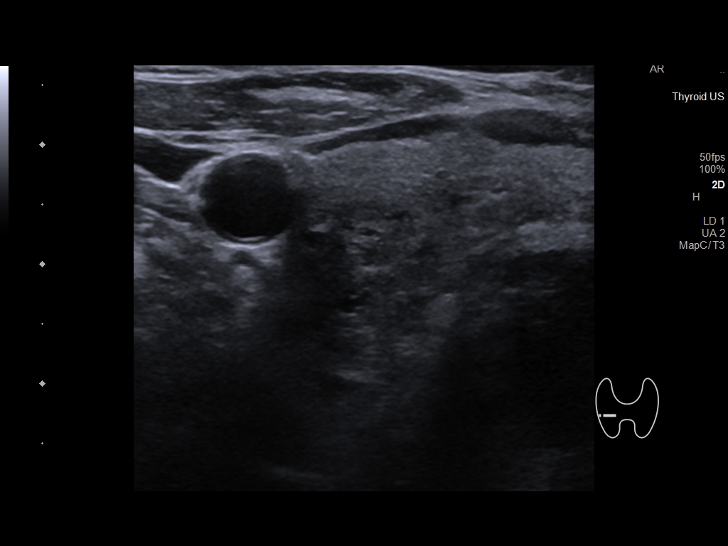
[im 12/71]
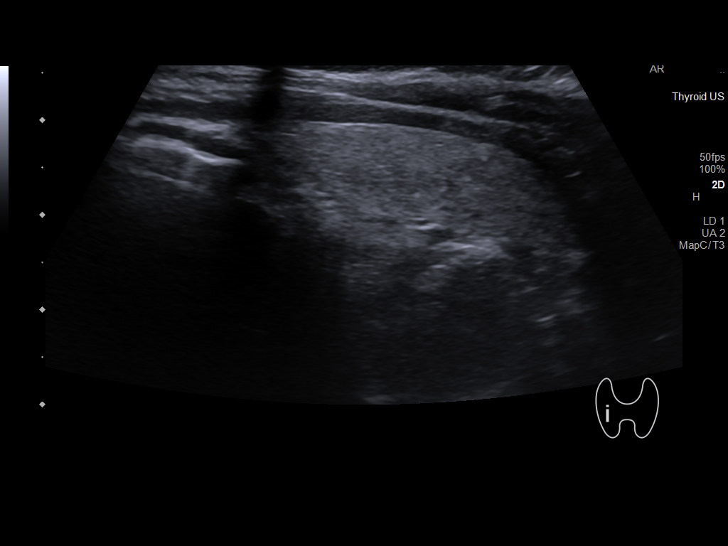
[im 18/71]
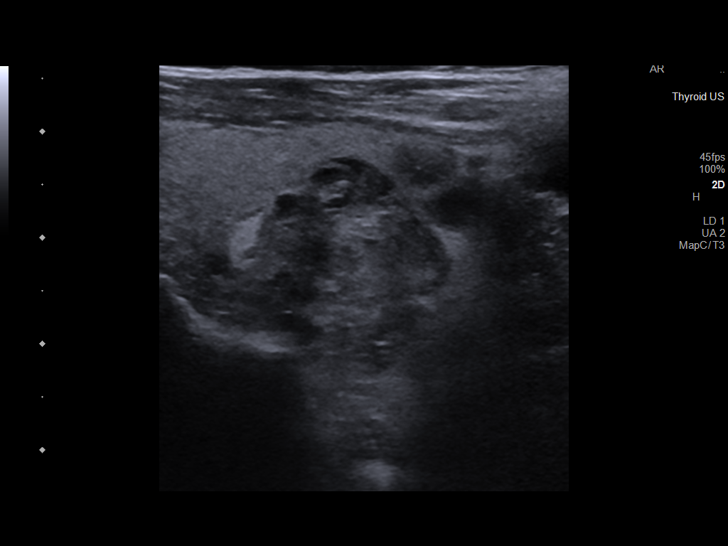
[im 24/71]
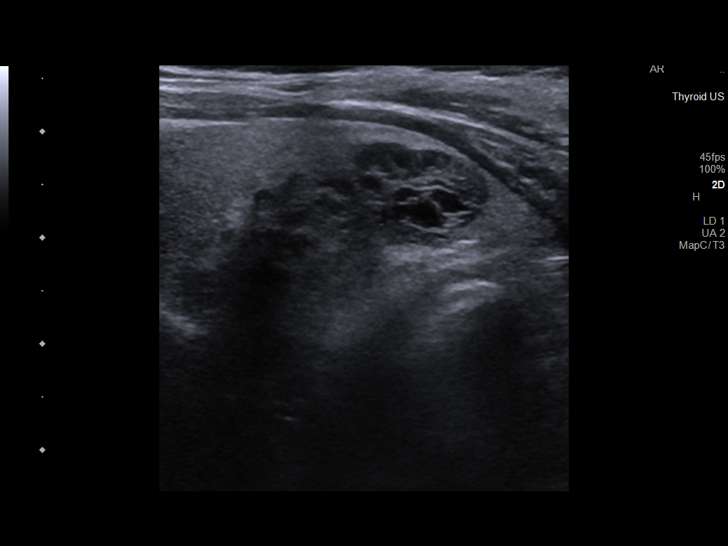
[im 30/71]
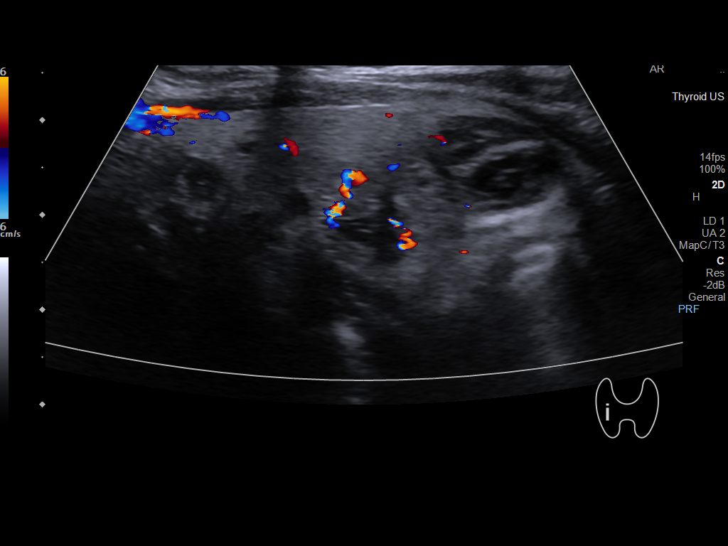
[im 36/71]
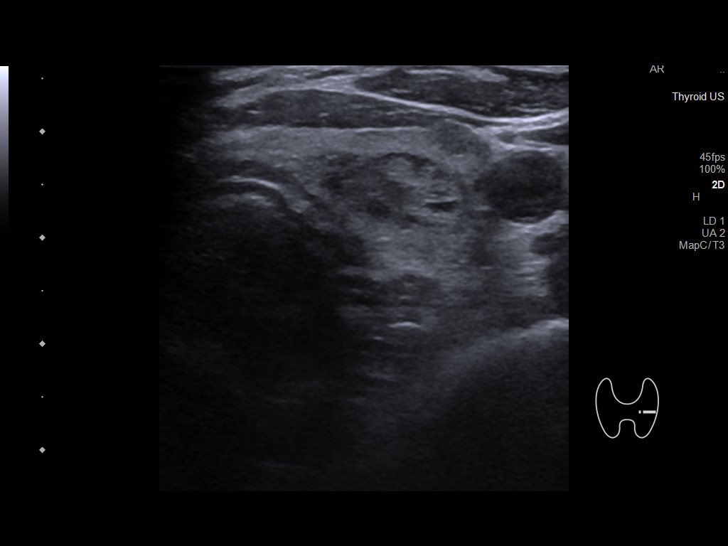
[im 41/71]
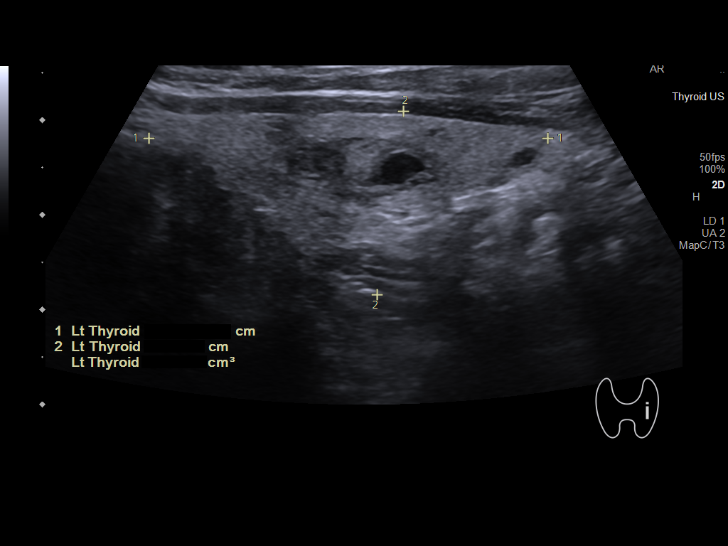
[im 47/71]
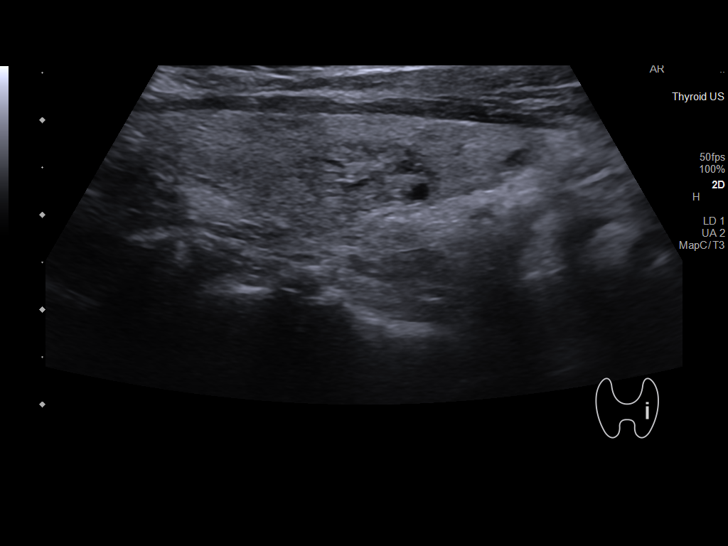
[im 53/71]
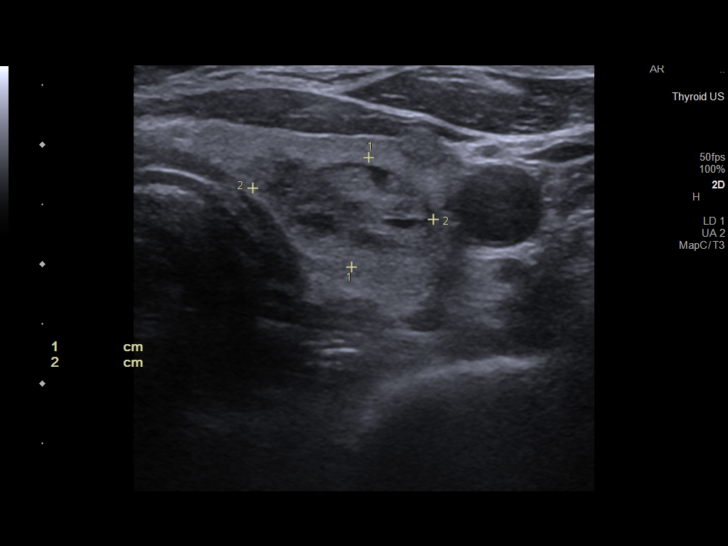
[im 59/71]
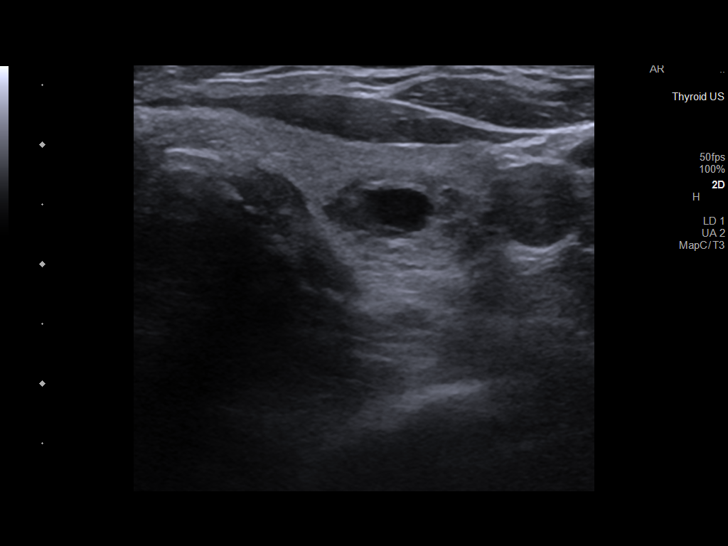
[im 65/71]
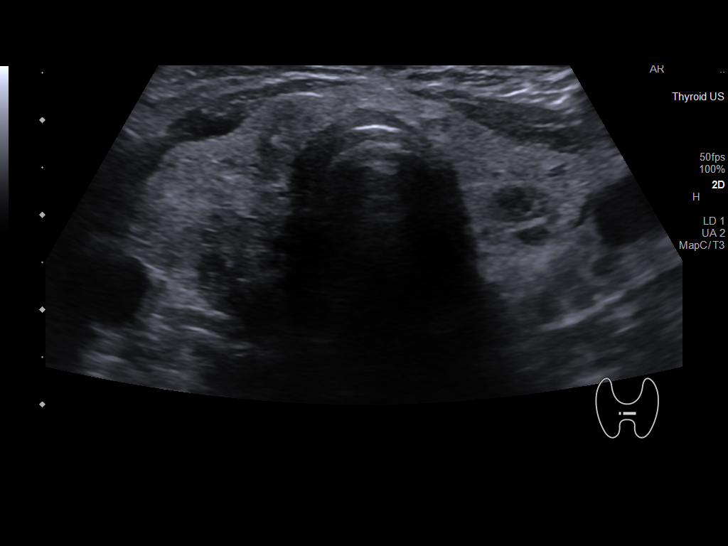
[im 71/71]
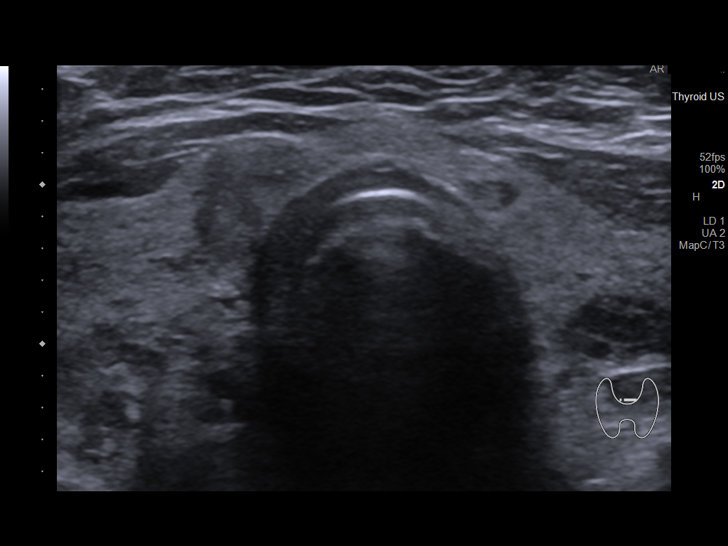

[13 of 25 positions shown; findings below may reference images not displayed]

FINDINGS: Parenchymal Echotexture: Moderately heterogenous

Isthmus: 0.3 cm

Right lobe: 4.5 x 2.3 x 1.6 cm

Left lobe: 4.2 x 2.0 x 1.8 cm

_________________________________________________________

Estimated total number of nodules >/= 1 cm: 3

Number of spongiform nodules >/=  2 cm not described below (TR1): 0

Number of mixed cystic and solid nodules >/= 1.5 cm not described
below (TR2): 0

_________________________________________________________

Nodule # 1:

Location: Right; Mid

Maximum size: 2.1 cm; Other 2 dimensions: 1.5 X 1.7 cm

Composition: solid/almost completely solid (2)

Echogenicity: hypoechoic (2)

Shape: not taller-than-wide (0)

Margins: ill-defined (0)

Echogenic foci: none (0)

ACR TI-RADS total points: 4.

ACR TI-RADS risk category: TR4 (4-6 points).

ACR TI-RADS recommendations:

**Given size (>/= 1.5 cm) and appearance, fine needle aspiration of
this moderately suspicious nodule should be considered based on
TI-RADS criteria.

_________________________________________________________

Nodule # 2: 1.3 cm spongiform nodule in the right mid gland.
Spongiform morphology is considered sonographically low risk/benign.
No further follow-up recommended.

Nodule # 3:

Location: Left; Mid

Maximum size: 1.4 cm; Other 2 dimensions: 1.1 x 0.9 cm

Composition: solid/almost completely solid (2)

Echogenicity: hypoechoic (2)

Shape: not taller-than-wide (0)

Margins: ill-defined (0)

Echogenic foci: none (0)

ACR TI-RADS total points: 4.

ACR TI-RADS risk category: TR4 (4-6 points).

ACR TI-RADS recommendations:

*Given size (>/= 1 - 1.4 cm) and appearance, a follow-up ultrasound
in 1 year should be considered based on TI-RADS criteria.

_________________________________________________________
IMPRESSION: 1. Heterogeneous multinodular thyroid gland most consistent with
multinodular goiter.
2. An approximately 2.1 cm TI-RADS category 4 nodule in the right
mid gland meets criteria to consider fine-needle aspiration biopsy
if not previously biopsied.
3. A 1.4 cm TI-RADS category 4 nodule in the left mid gland meets
criteria for imaging surveillance. Recommend follow-up ultrasound in
1 year.

The above is in keeping with the ACR TI-RADS recommendations - [HOSPITAL] [id number];[DATE].

## 2023-06-19 MED ORDER — FLUCONAZOLE 200 MG PO TABS: 200.0000 mg | ORAL_TABLET | Freq: Every day | ORAL | 99 refills | Status: AC

## 2023-06-21 ENCOUNTER — Ambulatory Visit: Payer: TRICARE For Life (TFL) | Admitting: Pulmonary Disease

## 2023-06-25 ENCOUNTER — Telehealth: Payer: Self-pay | Admitting: Pulmonary Disease

## 2023-06-25 NOTE — Telephone Encounter (Signed)
Patient called to let Dr. Craige Cotta know she has had a lot going on with knee surgeries and that is why she has not been able to come into the office. Patient is wanting to know if she needs to redo her HST or if she needs to just wait until she can come into the office. Please advise and call back.

## 2023-06-28 NOTE — Telephone Encounter (Signed)
Spoke with patient and advised she can wait to schedule ROV after next knee surgery. Patient also mentions she requested previous sleep study records to be sent to our office from Dr. Kathrynn Speed, [facility]. I cannot locate any records listed as such in patient chart, possibly it has not been scanned in yet. Advised patient we can always send another request if it cannot be located. Patient voiced understanding. Nothing further needed at this time.

## 2023-07-06 ENCOUNTER — Telehealth (HOSPITAL_COMMUNITY): Payer: Self-pay | Admitting: *Deleted

## 2023-07-06 NOTE — Telephone Encounter (Signed)
Received request via proficient from Dr Samson Frederic requesting temporary IVC filter placement done by Dr Randie Heinz. Hx of PE and previous IVC filter. Will give to [facility].

## 2023-07-19 ENCOUNTER — Other Ambulatory Visit: Payer: Self-pay

## 2023-07-19 DIAGNOSIS — E042 Nontoxic multinodular goiter: Secondary | ICD-10-CM

## 2023-07-26 ENCOUNTER — Ambulatory Visit: Payer: TRICARE For Life (TFL) | Admitting: "Endocrinology

## 2023-07-28 ENCOUNTER — Other Ambulatory Visit: Payer: Self-pay

## 2023-07-28 DIAGNOSIS — Z86711 Personal history of pulmonary embolism: Secondary | ICD-10-CM

## 2023-08-04 ENCOUNTER — Encounter (HOSPITAL_COMMUNITY): Payer: Self-pay

## 2023-08-04 ENCOUNTER — Emergency Department (HOSPITAL_COMMUNITY)
Admission: EM | Admit: 2023-08-04 | Discharge: 2023-08-04 | Disposition: A | Discharge status: Home / Self Care | Payer: Medicare Other | Attending: Emergency Medicine | Admitting: Emergency Medicine

## 2023-08-04 ENCOUNTER — Emergency Department (HOSPITAL_COMMUNITY): Payer: Medicare Other

## 2023-08-04 ENCOUNTER — Other Ambulatory Visit: Payer: Self-pay

## 2023-08-04 DIAGNOSIS — R1310 Dysphagia, unspecified: Secondary | ICD-10-CM | POA: Diagnosis not present

## 2023-08-04 DIAGNOSIS — Z7901 Long term (current) use of anticoagulants: Secondary | ICD-10-CM | POA: Insufficient documentation

## 2023-08-04 LAB — CBC WITH DIFFERENTIAL/PLATELET
Abs Immature Granulocytes: 0.02 10*3/uL (ref 0.00–0.07)
Basophils Absolute: 0.1 10*3/uL (ref 0.0–0.1)
Basophils Relative: 1 %
Eosinophils Absolute: 0.2 10*3/uL (ref 0.0–0.5)
Eosinophils Relative: 3 %
HCT: 48.6 % — ABNORMAL HIGH (ref 36.0–46.0)
Hemoglobin: 14.9 g/dL (ref 12.0–15.0)
Immature Granulocytes: 0 %
Lymphocytes Relative: 24 %
Lymphs Abs: 1.9 10*3/uL (ref 0.7–4.0)
MCH: 30 pg (ref 26.0–34.0)
MCHC: 30.7 g/dL (ref 30.0–36.0)
MCV: 98 fL (ref 80.0–100.0)
Monocytes Absolute: 0.5 10*3/uL (ref 0.1–1.0)
Monocytes Relative: 7 %
Neutro Abs: 5.4 10*3/uL (ref 1.7–7.7)
Neutrophils Relative %: 65 %
RBC: 4.96 MIL/uL (ref 3.87–5.11)
RDW: 15 % (ref 11.5–15.5)
Smear Review: ADEQUATE
WBC: 8.1 10*3/uL (ref 4.0–10.5)
nRBC: 0 % (ref 0.0–0.2)

## 2023-08-04 LAB — BASIC METABOLIC PANEL
Anion gap: 13 (ref 5–15)
BUN: 18 mg/dL (ref 8–23)
CO2: 26 mmol/L (ref 22–32)
Calcium: 9.3 mg/dL (ref 8.9–10.3)
Chloride: 100 mmol/L (ref 98–111)
Creatinine, Ser: 0.61 mg/dL (ref 0.44–1.00)
GFR, Estimated: >60 mL/min (ref >60)
Glucose, Bld: 106 mg/dL — ABNORMAL HIGH (ref 70–99)
Potassium: 4 mmol/L (ref 3.5–5.1)
Sodium: 139 mmol/L (ref 135–145)

## 2023-08-04 NOTE — ED Triage Notes (Signed)
Pt bib [facility] from home with c/o "something stuck in throat" after eating steak last night. Pt able to keep down fluids last night and this morning. EMS says pt swallowed water while on scene and water came back up. Pt speaking in full sentences. Dr Hyacinth Meeker at bedside.

## 2023-08-04 NOTE — ED Notes (Signed)
Discharge instructions provided by edp were reinforced to pt and s/o at bedside. Pt veralized understanding with no additional questions at this time. Pt wheelchair to car. Pt was able to transfer self from wheelchair to car with assistance from RN and s/o.

## 2023-08-04 NOTE — ED Notes (Signed)
Pt took several sips of diet coke, pt tolerated well, was able to keep fluids down. Dr Hyacinth Meeker aware.

## 2023-08-04 NOTE — ED Triage Notes (Signed)
Pt non-ambulatory with knee brace to left LE, says she "has no bone in that leg" Dr Hyacinth Meeker at bedside as pt states this. Per EMS pt meets criteria to be transported back home via EMS

## 2023-08-04 NOTE — Discharge Instructions (Signed)
Please see the phone number above for Dr. Jena Gauss, I have discussed your care with him and he has been kind enough to agree to see you in the office, until you are able to be seen in the office to set up for an endoscopy I want you to take lots of clear liquids at home.  This means drinking things like Gatorade, water, soup broth or things that are very soft like applesauce or mashed potatoes.  If you are developing severe worsening symptoms return to the ER.
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
## 2023-08-04 NOTE — ED Notes (Signed)
Pt was able to swallow several sips of diet cola and keep down.

## 2023-08-09 ENCOUNTER — Ambulatory Visit (HOSPITAL_COMMUNITY)
Admission: AD | Admit: 2023-08-09 | Discharge: 2023-08-09 | Disposition: A | Discharge status: Home / Self Care | Payer: Medicare Other | Source: Ambulatory Visit | Attending: Vascular Surgery | Admitting: Vascular Surgery

## 2023-08-09 ENCOUNTER — Encounter (HOSPITAL_COMMUNITY)
Admission: AD | Disposition: A | Discharge status: Home / Self Care | Payer: Self-pay | Source: Ambulatory Visit | Attending: Vascular Surgery

## 2023-08-09 ENCOUNTER — Other Ambulatory Visit: Payer: Self-pay

## 2023-08-09 ENCOUNTER — Telehealth: Payer: Self-pay | Admitting: Vascular Surgery

## 2023-08-09 DIAGNOSIS — Z86718 Personal history of other venous thrombosis and embolism: Secondary | ICD-10-CM | POA: Diagnosis not present

## 2023-08-09 DIAGNOSIS — Z96653 Presence of artificial knee joint, bilateral: Secondary | ICD-10-CM | POA: Diagnosis not present

## 2023-08-09 DIAGNOSIS — Z408 Encounter for other prophylactic surgery: Secondary | ICD-10-CM | POA: Insufficient documentation

## 2023-08-09 DIAGNOSIS — M25562 Pain in left knee: Secondary | ICD-10-CM | POA: Diagnosis not present

## 2023-08-09 DIAGNOSIS — Z7901 Long term (current) use of anticoagulants: Secondary | ICD-10-CM | POA: Insufficient documentation

## 2023-08-09 DIAGNOSIS — Z86711 Personal history of pulmonary embolism: Secondary | ICD-10-CM

## 2023-08-09 HISTORY — PX: IVC FILTER INSERTION: CATH118245

## 2023-08-09 LAB — POCT I-STAT, CHEM 8
BUN: 34 mg/dL — ABNORMAL HIGH (ref 8–23)
Calcium, Ion: 0.96 mmol/L — ABNORMAL LOW (ref 1.15–1.40)
Chloride: 105 mmol/L (ref 98–111)
Creatinine, Ser: 0.8 mg/dL (ref 0.44–1.00)
Glucose, Bld: 94 mg/dL (ref 70–99)
HCT: 40 % (ref 36.0–46.0)
Hemoglobin: 13.6 g/dL (ref 12.0–15.0)
Potassium: 4.8 mmol/L (ref 3.5–5.1)
Sodium: 138 mmol/L (ref 135–145)
TCO2: 29 mmol/L (ref 22–32)

## 2023-08-09 SURGERY — IVC FILTER INSERTION
Anesthesia: LOCAL

## 2023-08-09 MED ORDER — LIDOCAINE HCL (PF) 1 % IJ SOLN
INTRAMUSCULAR | Status: DC | PRN
  Administered 2023-08-09: 2 mL

## 2023-08-09 MED ORDER — IODIXANOL 320 MG/ML IV SOLN
INTRAVENOUS | Status: DC | PRN
  Administered 2023-08-09: 10 mL

## 2023-08-09 MED ORDER — HEPARIN (PORCINE) IN NACL 1000-0.9 UT/500ML-% IV SOLN
INTRAVENOUS | Status: DC | PRN
  Administered 2023-08-09 (×2): 500 mL

## 2023-08-09 MED ORDER — FENTANYL CITRATE (PF) 100 MCG/2ML IJ SOLN
INTRAMUSCULAR | Status: AC
  Filled 2023-08-09: qty 2

## 2023-08-09 MED ORDER — SODIUM CHLORIDE 0.9 % IV SOLN: INTRAVENOUS | Status: DC

## 2023-08-09 MED ORDER — MIDAZOLAM HCL 2 MG/2ML IJ SOLN
INTRAMUSCULAR | Status: DC | PRN
  Administered 2023-08-09: 1 mg via INTRAVENOUS

## 2023-08-09 MED ORDER — LIDOCAINE HCL (PF) 1 % IJ SOLN
INTRAMUSCULAR | Status: AC
  Filled 2023-08-09: qty 30

## 2023-08-09 MED ORDER — MIDAZOLAM HCL 2 MG/2ML IJ SOLN
INTRAMUSCULAR | Status: AC
  Filled 2023-08-09: qty 2

## 2023-08-09 MED ORDER — FENTANYL CITRATE (PF) 100 MCG/2ML IJ SOLN
INTRAMUSCULAR | Status: DC | PRN
  Administered 2023-08-09: 50 ug via INTRAVENOUS

## 2023-08-09 SURGICAL SUPPLY — 5 items
FILTER VC CELECT-FEMORAL (Filter) IMPLANT
KIT MICROPUNCTURE NIT STIFF (SHEATH) IMPLANT
KIT SYRINGE INJ CVI SPIKEX1 (MISCELLANEOUS) IMPLANT
SET ATX-X65L (MISCELLANEOUS) IMPLANT
TRAY PV CATH (CUSTOM PROCEDURE TRAY) ×2 IMPLANT

## 2023-08-09 NOTE — Op Note (Signed)
    Patient name: Norma Anthony MRN: [id number] DOB: 07-15-1956 Sex: female  08/09/2023 Pre-operative Diagnosis: History of DVT in need of revision of left knee arthroplasty Post-operative diagnosis:  Same Surgeon:  Luanna Salk. Randie Heinz, MD Procedure Performed: 1.  Ultrasound-guided cannulation right common femoral vein 2.  IVC venogram 3.  Placement of infrarenal Cook Celect IVC filter 4.  Moderate sedation with fentanyl and Versed for 7 minutes  Indications: 67 year old female has a history of DVT and she is currently maintained on Xarelto.  She has undergone a total knee arthroplasty on the left with subsequent removal now with a spacer in place.  She did have an IVC filter initially but this was removed and no further procedures were required.  She is now indicated for repeat filter placement.  Findings: The IVC remained patent.  Filter was placed with a slight tilt towards the patient left but the heart does appear to be warm.  Plan will be to follow-up in the office in 3 to 4 months to discuss need for further orthopedic procedures and possible IVC filter removal.   Procedure:  The patient was identified in the holding area and taken to room 8.  The patient was then placed supine on the table and prepped and draped in the usual sterile fashion.  A time out was called.  Ultrasound was used to evaluate the right common femoral vein which was patent and compressible.  The area was anesthetized with 1% lidocaine and cannulated with a micropuncture needle followed by wire and a sheath.  Ultrasound images saved permanent record.  We administered fentanyl and Versed as moderate sedation and her vital signs were monitored throughout the case.  We placed a Bentson wire and dilated the wire tract and then placed the introducer sheath and IVC venogram was performed.  We marked the renal veins on the screen and then deployed filter just below the renal veins.  A single image was performed which demonstrated the  filter in place.  Sheath was removed and pressure was held until hemostasis was obtained.  She tolerated the procedure without any complication.  Contrast: 10 cc    Maybel Dambrosio C. Randie Heinz, MD [facility] Office: [phone] Pager: [phone]

## 2023-08-09 NOTE — Telephone Encounter (Signed)
-----   Message from [name] sent at 08/09/2023  9:53 AM EDT ----- [name] [id number] Nov 27, 1956  08/09/2023 Pre-operative Diagnosis: History of DVT in need of revision of left knee arthroplasty  Surgeon:  Luanna Salk. Randie Heinz, MD  Procedure Performed: 1.  Ultrasound-guided cannulation right common femoral vein 2.  IVC venogram 3.  Placement of infrarenal Cook Celect IVC filter 4.  Moderate sedation with fentanyl and Versed for 7 minutes  Please have patient follow-up with me in 3 to 4 months without studies to discuss IVC filter removal.  Norma Anthony

## 2023-08-09 NOTE — H&P (Addendum)
H+P       CHIEF COMPLAINT: here for ivc filter placement   HISTORY OF PRESENT ILLNESS: Norma Anthony is a 67 y.o. female referred to clinic for discussion of IVC filter placement prior to orthopedic surgery.  she has undergone right tka now with I+D left knee and planned for knee spacer removal. She is here for replacement of ivc filter.       Past Medical History:  Diagnosis Date   Anxiety     CAD (coronary artery disease)     COPD (chronic obstructive pulmonary disease) (HCC)     Depression     Dyspnea     Fibromyalgia     GERD (gastroesophageal reflux disease)     Hiatal hernia     History of kidney stones     Migraine headache     Multinodular goiter     Nephrolithiasis     Pulmonary embolism (HCC) 1996   Sleep apnea     Thyroid nodule                 Past Surgical History:  Procedure Laterality Date   ABDOMINAL HYSTERECTOMY       BIOPSY THYROID       HERNIA REPAIR       I & D EXTREMITY Left 12/11/2022    Procedure: IRRIGATION AND DEBRIDEMENT EXTREMITY;  Surgeon: Yolonda Kida, MD;  Location: [facility] ORS;  Service: Orthopedics;  Laterality: Left;   IVC FILTER INSERTION N/A 09/28/2022    Procedure: IVC FILTER INSERTION;  Surgeon: Maeola Harman, MD;  Location: [facility] INVASIVE CV LAB;  Service: Cardiovascular;  Laterality: N/A;   KIDNEY STONE SURGERY       LAPAROSCOPIC NISSEN FUNDOPLICATION       PAROTIDECTOMY       TONSILLECTOMY       TOTAL KNEE ARTHROPLASTY Left 11/04/2022    Procedure: TOTAL KNEE ARTHROPLASTY;  Surgeon: Jene Every, MD;  Location: [facility] ORS;  Service: Orthopedics;  Laterality: Left;          Allergies       Allergies  Allergen Reactions   Lyrica [Pregabalin] Other (See Comments)      Paralysis present (finding)- not tolerated Pt stated she could not move her body but she could think        Belbuca [Buprenorphine Hcl] Other (See Comments)      " Horrible headache"    Compazine [Prochlorperazine] Anxiety   Droperidol Anxiety    Ergotamine-Caffeine Anxiety and Hypertension      Increased systolic arterial pressure (finding)                 Prior to Admission medications   Medication Sig Start Date End Date Taking? Authorizing Provider  acetaminophen (TYLENOL) 500 MG tablet Take 1,000 mg by mouth every 6 (six) hours as needed for moderate pain.     Yes [provider]  albuterol (VENTOLIN HFA) 108 (90 Base) MCG/ACT inhaler Inhale 2 puffs into the lungs every 6 (six) hours as needed for wheezing or shortness of breath. 05/12/21   Yes [provider]  ALPRAZolam Prudy Feeler) 1 MG tablet Take 1 mg by mouth every 6 (six) hours as needed for anxiety. 12/29/22   Yes [provider]  aspirin EC 81 MG tablet Take 81 mg by mouth daily. Swallow whole.     Yes [provider]  Aspirin-Acetaminophen-Caffeine (GOODY HEADACHE PO) Take 1 packet by mouth daily as needed (pain).     Yes [provider]  b complex vitamins capsule Take 1 capsule by mouth daily.     Yes [provider]  Cholecalciferol (VITAMIN D3) 1.25 MG (50000 UT) CAPS Take 50,000 Units by mouth every Wednesday. 02/16/22   Yes [provider]  clindamycin-benzoyl peroxide (BENZACLIN) gel Apply 1 Application topically 2 (two) times daily as needed (for acne breakouts).     Yes [provider]  clotrimazole-betamethasone (LOTRISONE) cream Apply 1 Application topically 2 (two) times daily as needed (for irritation under the breasts or abdominal folds).     Yes [provider]  diphenhydrAMINE (SOMINEX) 25 MG tablet Take 50-75 mg by mouth daily as needed for allergies.     Yes [provider]  DULoxetine (CYMBALTA) 60 MG capsule Take 60 mg by mouth at bedtime. 09/14/18   Yes [provider]  famotidine (PEPCID) 20 MG tablet One after supper 04/30/22   Yes Nyoka Cowden, MD  Ferrous Fumarate-Folic Acid 324-1 MG TABS Take 1 tablet by mouth daily. Hematinic 02/29/20   Yes [provider]  gabapentin (NEURONTIN) 400 MG capsule Take 400 mg by mouth 3 (three) times daily. 08/21/22   Yes [provider]  melatonin 5 MG TABS Take 10 mg by mouth at bedtime.     Yes [provider]  Multiple Vitamins-Minerals (HAIR SKIN AND NAILS FORMULA) TABS Take 1 tablet by mouth daily.     Yes [provider]  Multiple Vitamins-Minerals (IMMUNE SUPPORT PO) Take 1 tablet by mouth daily.     Yes [provider]  naloxone (NARCAN) nasal spray 4 mg/0.1 mL Place 1 spray into the nose once as needed (accidental over dose). 10/16/21   Yes [provider]  Oxycodone HCl 10 MG TABS Take 10 mg by mouth 2 (two) times daily as needed (pain). 12/21/22   Yes [provider]  pantoprazole (PROTONIX) 40 MG tablet Take 40 mg by mouth daily with breakfast. 06/30/16   Yes [provider]  promethazine (PHENERGAN) 25 MG tablet Take 25 mg by mouth every 6 (six) hours as needed for nausea or vomiting.     Yes [provider]  salicylic acid-lactic acid (CORN & CALLUS REMOVER) 17 % external solution Apply 1 Application topically daily as needed (corns).     Yes [provider]  tiZANidine (ZANAFLEX) 2 MG tablet Take 2 mg by mouth every 12 (twelve) hours as needed for muscle spasms. 01/09/23   Yes [provider]  XARELTO 20 MG TABS tablet Take 20 mg by mouth at bedtime. 08/21/21   Yes [provider]  furosemide (LASIX) 20 MG tablet Take 1 tablet (20 mg total) by mouth daily. Patient not taking: Reported on 12/11/2022 08/27/22     Teressa Lower, PA-C  polyethylene glycol (MIRALAX / GLYCOLAX) 17 g packet Take 17 g by mouth daily. Patient taking differently: Take 17 g by mouth daily as needed for mild constipation. 11/04/22     Jene Every, MD      Social History         Socioeconomic History   Marital status: Married      Spouse name: Not on file   Number of children: Not on file   Years of education: Not on  file   Highest education level: Not on file  Occupational History   Not on file  Tobacco Use   Smoking status: Former      Packs/day: 0.50      Years: 10.00  Total pack years: 5.00      Types: Cigarettes      Quit date: 02/12/2021      Years since quitting: 1.9   Smokeless tobacco: Never  Vaping Use   Vaping Use: Never used  Substance and Sexual Activity   Alcohol use: Not Currently   Drug use: Not Currently   Sexual activity: Not on file  Other Topics Concern   Not on file  Social History Narrative   Not on file    Social Determinants of Health        Financial Resource Strain: Not on file  Food Insecurity: No Food Insecurity (12/11/2022)    Hunger Vital Sign     Worried About Running Out of Food in the Last Year: Never true     Ran Out of Food in the Last Year: Never true  Transportation Needs: No Transportation Needs (12/11/2022)    PRAPARE - Therapist, art (Medical): No     Lack of Transportation (Non-Medical): No  Physical Activity: Not on file  Stress: Not on file  Social Connections: Not on file  Intimate Partner Violence: Not At Risk (12/11/2022)    Humiliation, Afraid, Rape, and Kick questionnaire     Fear of Current or Ex-Partner: No     Emotionally Abused: No     Physically Abused: No     Sexually Abused: No           Family History  Problem Relation Age of Onset   Hypertension Mother            ROS: no complaints today     Physical Examination   Vitals:   08/09/23 0738  BP: 131/87  Pulse: 77  Resp: 16  Temp: 97.7 F (36.5 C)  SpO2: 92%      Aaox3 Left knee brace in place      ASSESSMENT / PLAN: 67 y.o. female with history of DVT s/p TKA now in need of spacer removal. I counseled the patient about the risks / benefits / alternatives to IVC filter placement and she demonstrates good understanding. Will leave in place until definitively ok to remove.        Mansa Willers C. Randie Heinz, MD [facility] Office: [phone] Pager: [phone]

## 2023-08-09 NOTE — Telephone Encounter (Signed)
Pt currently in hospital.

## 2023-08-10 ENCOUNTER — Encounter (HOSPITAL_COMMUNITY): Payer: Self-pay | Admitting: Vascular Surgery

## 2023-08-11 NOTE — Progress Notes (Signed)
Sent message, via epic in basket, requesting orders in epic from surgeon.  

## 2023-08-11 NOTE — Telephone Encounter (Signed)
PT appt scheduled .

## 2023-08-12 ENCOUNTER — Ambulatory Visit: Payer: Self-pay | Admitting: Student

## 2023-08-12 NOTE — Patient Instructions (Addendum)
SURGICAL WAITING ROOM VISITATION  Patients having surgery or a procedure may have no more than 2 support people in the waiting area - these visitors may rotate.    Children under the age of 53 must have an adult with them who is not the patient.  Due to an increase in RSV and influenza rates and associated hospitalizations, children ages 37 and under may not visit patients in [facility] hospitals.  If the patient needs to stay at the hospital during part of their recovery, the visitor guidelines for inpatient rooms apply. Pre-op nurse will coordinate an appropriate time for 1 support person to accompany patient in pre-op.  This support person may not rotate.    Please refer to the [facility] website for the visitor guidelines for Inpatients (after your surgery is over and you are in a regular room).    Your procedure is scheduled on: 08/25/23   Report to [facility] Main Entrance    Report to admitting at 9:10 AM   Call this number if you have problems the morning of surgery [phone]   Do not eat food :After Midnight.   After Midnight you may have the following liquids until 8:40 AM DAY OF SURGERY  Water Non-Citrus Juices (without pulp, NO RED-Apple, White grape, White cranberry) Black Coffee (NO MILK/CREAM OR CREAMERS, sugar ok)  Clear Tea (NO MILK/CREAM OR CREAMERS, sugar ok) regular and decaf                             Plain Jell-O (NO RED)                                           Fruit ices (not with fruit pulp, NO RED)                                     Popsicles (NO RED)                                                               Sports drinks like Gatorade (NO RED)                 The day of surgery:  Drink ONE (1) Pre-Surgery Clear Ensure at 8:40 AM the morning of surgery. Drink in one sitting. Do not sip.  This drink was given to you during your hospital  pre-op appointment visit. Nothing else to drink after completing the  Pre-Surgery Clear  Ensure.          If you have questions, please contact your surgeon's office.   FOLLOW BOWEL PREP AND ANY ADDITIONAL PRE OP INSTRUCTIONS YOU RECEIVED FROM YOUR SURGEON'S OFFICE!!!     Oral Hygiene is also important to reduce your risk of infection.                                    Remember - BRUSH YOUR TEETH THE MORNING OF SURGERY WITH YOUR REGULAR TOOTHPASTE  DENTURES WILL  BE REMOVED PRIOR TO SURGERY PLEASE DO NOT APPLY "Poly grip" OR ADHESIVES!!!   Stop all vitamins and herbal supplements 7 days before surgery.   Take these medicines the morning of surgery with A SIP OF WATER: Albuterol, alprazolam, Gabapentin, Zofran, Pantoprazole, Promethazine   These are anesthesia recommendations for holding your anticoagulants.  Please contact your prescribing physician to confirm IF it is safe to hold your anticoagulants for this length of time.   Eliquis Apixaban   72 hours   Xarelto Rivaroxaban   72 hours  Plavix Clopidogrel   120 hours  Pletal Cilostazol   120 hours                                You may not have any metal on your body including hair pins, jewelry, and body piercing             Do not wear make-up, lotions, powders, perfumes, or deodorant  Do not wear nail polish including gel and S&S, artificial/acrylic nails, or any other type of covering on natural nails including finger and toenails. If you have artificial nails, gel coating, etc. that needs to be removed by a nail salon please have this removed prior to surgery or surgery may need to be canceled/ delayed if the surgeon/ anesthesia feels like they are unable to be safely monitored.   Do not shave  48 hours prior to surgery.    Do not bring valuables to the hospital. [facility] IS NOT             RESPONSIBLE   FOR VALUABLES.   Contacts, glasses, dentures or bridgework may not be worn into surgery.   Bring small overnight bag day of surgery.   DO NOT BRING YOUR HOME MEDICATIONS TO THE HOSPITAL. PHARMACY WILL  DISPENSE MEDICATIONS LISTED ON YOUR MEDICATION LIST TO YOU DURING YOUR ADMISSION IN THE HOSPITAL!              Please read over the following fact sheets you were given: IF YOU HAVE QUESTIONS ABOUT YOUR PRE-OP INSTRUCTIONS PLEASE CALL [name]   If you received a COVID test during your pre-op visit  it is requested that you wear a mask when out in public, stay away from anyone that may not be feeling well and notify your surgeon if you develop symptoms. If you test positive for Covid or have been in contact with anyone that has tested positive in the last 10 days please notify you surgeon.      Pre-operative 5 CHG Bath Instructions   You can play a key role in reducing the risk of infection after surgery. Your skin needs to be as free of germs as possible. You can reduce the number of germs on your skin by washing with CHG (chlorhexidine gluconate) soap before surgery. CHG is an antiseptic soap that kills germs and continues to kill germs even after washing.   DO NOT use if you have an allergy to chlorhexidine/CHG or antibacterial soaps. If your skin becomes reddened or irritated, stop using the CHG and notify one of our RNs at [phone].   Please shower with the CHG soap starting 4 days before surgery using the following schedule:     Please keep in mind the following:  DO NOT shave, including legs and underarms, starting the day of your first shower.   You may shave your face at any point before/day  of surgery.  Place clean sheets on your bed the day you start using CHG soap. Use a clean washcloth (not used since being washed) for each shower. DO NOT sleep with pets once you start using the CHG.   CHG Shower Instructions:  If you choose to wash your hair and private area, wash first with your normal shampoo/soap.  After you use shampoo/soap, rinse your hair and body thoroughly to remove shampoo/soap residue.  Turn the water OFF and apply about 3 tablespoons (45 ml) of  CHG soap to a CLEAN washcloth.  Apply CHG soap ONLY FROM YOUR NECK DOWN TO YOUR TOES (washing for 3-5 minutes)  DO NOT use CHG soap on face, private areas, open wounds, or sores.  Pay special attention to the area where your surgery is being performed.  If you are having back surgery, having someone wash your back for you may be helpful. Wait 2 minutes after CHG soap is applied, then you may rinse off the CHG soap.  Pat dry with a clean towel  Put on clean clothes/pajamas   If you choose to wear lotion, please use ONLY the CHG-compatible lotions on the back of this paper.     Additional instructions for the day of surgery: DO NOT APPLY any lotions, deodorants, cologne, or perfumes.   Put on clean/comfortable clothes.  Brush your teeth.  Ask your nurse before applying any prescription medications to the skin.      CHG Compatible Lotions   Aveeno Moisturizing lotion  Cetaphil Moisturizing Cream  Cetaphil Moisturizing Lotion  Clairol Herbal Essence Moisturizing Lotion, Dry Skin  Clairol Herbal Essence Moisturizing Lotion, Extra Dry Skin  Clairol Herbal Essence Moisturizing Lotion, Normal Skin  Curel Age Defying Therapeutic Moisturizing Lotion with Alpha Hydroxy  Curel Extreme Care Body Lotion  Curel Soothing Hands Moisturizing Hand Lotion  Curel Therapeutic Moisturizing Cream, Fragrance-Free  Curel Therapeutic Moisturizing Lotion, Fragrance-Free  Curel Therapeutic Moisturizing Lotion, Original Formula  Eucerin Daily Replenishing Lotion  Eucerin Dry Skin Therapy Plus Alpha Hydroxy Crme  Eucerin Dry Skin Therapy Plus Alpha Hydroxy Lotion  Eucerin Original Crme  Eucerin Original Lotion  Eucerin Plus Crme Eucerin Plus Lotion  Eucerin TriLipid Replenishing Lotion  Keri Anti-Bacterial Hand Lotion  Keri Deep Conditioning Original Lotion Dry Skin Formula Softly Scented  Keri Deep Conditioning Original Lotion, Fragrance Free Sensitive Skin Formula  Keri Lotion Fast Absorbing  Fragrance Free Sensitive Skin Formula  Keri Lotion Fast Absorbing Softly Scented Dry Skin Formula  Keri Original Lotion  Keri Skin Renewal Lotion Keri Silky Smooth Lotion  Keri Silky Smooth Sensitive Skin Lotion  Nivea Body Creamy Conditioning Oil  Nivea Body Extra Enriched Teacher, adult education Moisturizing Lotion Nivea Crme  Nivea Skin Firming Lotion  NutraDerm 30 Skin Lotion  NutraDerm Skin Lotion  NutraDerm Therapeutic Skin Cream  NutraDerm Therapeutic Skin Lotion  ProShield Protective Hand Cream  Provon moisturizing lotion  WHAT IS A BLOOD TRANSFUSION? Blood Transfusion Information  A transfusion is the replacement of blood or some of its parts. Blood is made up of multiple cells which provide different functions. Red blood cells carry oxygen and are used for blood loss replacement. White blood cells fight against infection. Platelets control bleeding. Plasma helps clot blood. Other blood products are available for specialized needs, such as hemophilia or other clotting disorders. BEFORE THE TRANSFUSION  Who gives blood for transfusions?  Healthy volunteers who are fully evaluated to make sure their blood is safe. This  is blood bank blood. Transfusion therapy is the safest it has ever been in the practice of medicine. Before blood is taken from a donor, a complete history is taken to make sure that person has no history of diseases nor engages in risky social behavior (examples are intravenous drug use or sexual activity with multiple partners). The donor's travel history is screened to minimize risk of transmitting infections, such as malaria. The donated blood is tested for signs of infectious diseases, such as HIV and hepatitis. The blood is then tested to be sure it is compatible with you in order to minimize the chance of a transfusion reaction. If you or a relative donates blood, this is often done in anticipation of surgery and is not appropriate  for emergency situations. It takes many days to process the donated blood. RISKS AND COMPLICATIONS Although transfusion therapy is very safe and saves many lives, the main dangers of transfusion include:  Getting an infectious disease. Developing a transfusion reaction. This is an allergic reaction to something in the blood you were given. Every precaution is taken to prevent this. The decision to have a blood transfusion has been considered carefully by your caregiver before blood is given. Blood is not given unless the benefits outweigh the risks. AFTER THE TRANSFUSION Right after receiving a blood transfusion, you will usually feel much better and more energetic. This is especially true if your red blood cells have gotten low (anemic). The transfusion raises the level of the red blood cells which carry oxygen, and this usually causes an energy increase. The nurse administering the transfusion will monitor you carefully for complications. HOME CARE INSTRUCTIONS  No special instructions are needed after a transfusion. You may find your energy is better. Speak with your caregiver about any limitations on activity for underlying diseases you may have. SEEK MEDICAL CARE IF:  Your condition is not improving after your transfusion. You develop redness or irritation at the intravenous (IV) site. SEEK IMMEDIATE MEDICAL CARE IF:  Any of the following symptoms occur over the next 12 hours: Shaking chills. You have a temperature by mouth above 102 F (38.9 C), not controlled by medicine. Chest, back, or muscle pain. People around you feel you are not acting correctly or are confused. Shortness of breath or difficulty breathing. Dizziness and fainting. You get a rash or develop hives. You have a decrease in urine output. Your urine turns a dark color or changes to pink, red, or brown. Any of the following symptoms occur over the next 10 days: You have a temperature by mouth above 102 F (38.9 C),  not controlled by medicine. Shortness of breath. Weakness after normal activity. The white part of the eye turns yellow (jaundice). You have a decrease in the amount of urine or are urinating less often. Your urine turns a dark color or changes to pink, red, or brown. Document Released: 11/27/2000 Document Revised: 02/22/2012 Document Reviewed: 07/16/2008 [employer] Patient Information 2014 [place], [place].  _______________________________________________________________________  Incentive Spirometer  An incentive spirometer is a tool that can help keep your lungs clear and active. This tool measures how well you are filling your lungs with each breath. Taking long deep breaths may help reverse or decrease the chance of developing breathing (pulmonary) problems (especially infection) following: A long period of time when you are unable to move or be active. BEFORE THE PROCEDURE  If the spirometer includes an indicator to show your best effort, your nurse or respiratory therapist will set it to  a desired goal. If possible, sit up straight or lean slightly forward. Try not to slouch. Hold the incentive spirometer in an upright position. INSTRUCTIONS FOR USE  Sit on the edge of your bed if possible, or sit up as far as you can in bed or on a chair. Hold the incentive spirometer in an upright position. Breathe out normally. Place the mouthpiece in your mouth and seal your lips tightly around it. Breathe in slowly and as deeply as possible, raising the piston or the ball toward the top of the column. Hold your breath for 3-5 seconds or for as long as possible. Allow the piston or ball to fall to the bottom of the column. Remove the mouthpiece from your mouth and breathe out normally. Rest for a few seconds and repeat Steps 1 through 7 at least 10 times every 1-2 hours when you are awake. Take your time and take a few normal breaths between deep breaths. The spirometer may include an indicator to  show your best effort. Use the indicator as a goal to work toward during each repetition. After each set of 10 deep breaths, practice coughing to be sure your lungs are clear. If you have an incision (the cut made at the time of surgery), support your incision when coughing by placing a pillow or rolled up towels firmly against it. Once you are able to get out of bed, walk around indoors and cough well. You may stop using the incentive spirometer when instructed by your caregiver.  RISKS AND COMPLICATIONS Take your time so you do not get dizzy or light-headed. If you are in pain, you may need to take or ask for pain medication before doing incentive spirometry. It is harder to take a deep breath if you are having pain. AFTER USE Rest and breathe slowly and easily. It can be helpful to keep track of a log of your progress. Your caregiver can provide you with a simple table to help with this. If you are using the spirometer at home, follow these instructions: SEEK MEDICAL CARE IF:  You are having difficultly using the spirometer. You have trouble using the spirometer as often as instructed. Your pain medication is not giving enough relief while using the spirometer. You develop fever of 100.5 F (38.1 C) or higher. SEEK IMMEDIATE MEDICAL CARE IF:  You cough up bloody sputum that had not been present before. You develop fever of 102 F (38.9 C) or greater. You develop worsening pain at or near the incision site. MAKE SURE YOU:  Understand these instructions. Will watch your condition. Will get help right away if you are not doing well or get worse. Document Released: 04/12/2007 Document Revised: 02/22/2012 Document Reviewed: 06/13/2007 [facility] Patient Information 2014 [place], [place].   ________________________________________________________________________

## 2023-08-12 NOTE — Progress Notes (Addendum)
COVID Vaccine Completed: yes  Date of COVID positive in last 90 days: no  PCP - [facility]- Dr. Tilden Fossa Cardiologist - n/a Pulmonologist- Sandrea Hughs, MD  Chest x-ray - 08/04/23 Epic EKG - 08/05/23 Epic Stress Test - long time ago per pt ECHO - long time ago per pt Cardiac Cath - n/a Pacemaker/ICD device last checked: n/a Spinal Cord Stimulator: n/a  Bowel Prep - no  Sleep Study - yes CPAP -  no  Fasting Blood Sugar - n/a Checks Blood Sugar _____ times a day  Last dose of GLP1 agonist-  N/A GLP1 instructions:  N/A   Last dose of SGLT-2 inhibitors-  N/A SGLT-2 instructions: N/A   Blood Thinner Instructions:  Xarelto, has appointment 08/17/23 with Surgeon Aspirin Instructions: Last Dose:  Activity level: Can perform activities of daily living without stopping and without symptoms of chest pain. Not ambulating currently due to knee. SOB occasionally per pt  Anesthesia review: COPD, acute respiratory failure, lung nodule, CAD, OSA, DVT  Patient denies shortness of breath, fever, cough and chest pain at PAT appointment  Patient verbalized understanding of instructions that were given to them at the PAT appointment. Patient was also instructed that they will need to review over the PAT instructions again at home before surgery.

## 2023-08-13 ENCOUNTER — Encounter (HOSPITAL_COMMUNITY): Payer: Self-pay

## 2023-08-13 ENCOUNTER — Other Ambulatory Visit: Payer: Self-pay

## 2023-08-13 ENCOUNTER — Encounter (HOSPITAL_COMMUNITY)
Admission: RE | Admit: 2023-08-13 | Discharge: 2023-08-13 | Disposition: A | Discharge status: Home / Self Care | Payer: Medicare Other | Source: Ambulatory Visit | Attending: Orthopedic Surgery | Admitting: Orthopedic Surgery

## 2023-08-13 VITALS — BP 134/73 | HR 86 | Temp 97.8°F | Resp 16 | Ht 66.0 in | Wt 250.0 lb

## 2023-08-13 DIAGNOSIS — Z01812 Encounter for preprocedural laboratory examination: Secondary | ICD-10-CM | POA: Diagnosis not present

## 2023-08-13 DIAGNOSIS — Z01818 Encounter for other preprocedural examination: Secondary | ICD-10-CM

## 2023-08-13 LAB — TYPE AND SCREEN
ABO/RH(D): A POS
Antibody Screen: NEGATIVE

## 2023-08-13 LAB — SURGICAL PCR SCREEN
MRSA, PCR: NEGATIVE
Staphylococcus aureus: POSITIVE — AB

## 2023-08-24 ENCOUNTER — Ambulatory Visit: Payer: Self-pay | Admitting: Student

## 2023-08-24 NOTE — H&P (View-Only) (Signed)
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 female who presents for preoperative history and physical with a complex history regarding her left knee. She underwent primary left total knee arthroplasty by Dr. Shelle Iron on 11/04/2022. She subsequently fell, sustaining a atraumatic dehiscence of her incision on 12/11/2022. Dr. Aundria Rud was on-call and took her to the operating room for debridement and closure. 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 Dr. Shelle Iron worked her up for infection which was completely negative. On 02/03/2023 Dr. Linna Caprice took her to the OR for repair of her arthrotomy. She had issues with delayed wound healing. 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 She again had delayed wound healing. Infection cleared. Patient presents to proceed with resection of left knee antibiotic spacer and revision left total knee arthroplasty.       Past Medical History:  Diagnosis Date   Acute respiratory failure with hypoxia (HCC) 03/23/2022   Acute respiratory failure with hypoxia (HCC) 03/23/2022   Anemia    Anxiety    Arthritis    CAD (coronary artery disease)    pt deneies   COPD (chronic obstructive pulmonary disease) (HCC)    Depression    Dyspnea    Fibromyalgia    Generalized anxiety disorder 12/11/2022   GERD (gastroesophageal reflux disease)    Hiatal hernia    History of kidney stones    Lung nodule 03/24/2022   Migraine headache    Multinodular goiter    Nephrolithiasis    Obstructive sleep apnea syndrome 07/01/2021   Opioid dependence (HCC) 03/23/2022   Pneumonia    Pulmonary embolism (HCC) 1996   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   Sleep apnea    Thyroid nodule    Past Surgical History:  Procedure Laterality Date   ABDOMINAL HYSTERECTOMY     BIOPSY THYROID     EXCISIONAL TOTAL KNEE ARTHROPLASTY WITH ANTIBIOTIC SPACERS Left 03/10/2023   Procedure: Irrigation and debridement left knee, resection total knee arthroplasty, antibiotic spacer placement;  Surgeon: Samson Frederic, MD;  Location: [facility];  Service: Orthopedics;  Laterality: Left;   HERNIA REPAIR     I & D EXTREMITY Left 12/11/2022   Procedure: IRRIGATION AND DEBRIDEMENT EXTREMITY;  Surgeon: Yolonda Kida, MD;  Location: [facility];  Service: Orthopedics;  Laterality: Left;   IVC FILTER INSERTION N/A 09/28/2022   Procedure: IVC FILTER INSERTION;  Surgeon: Maeola Harman, MD;  Location: [facility] INVASIVE CV LAB;  Service: Cardiovascular;  Laterality: N/A;   IVC FILTER INSERTION N/A 08/09/2023   Procedure: IVC FILTER INSERTION;  Surgeon: Maeola Harman, MD;  Location: [facility] INVASIVE CV LAB;  Service: Cardiovascular;  Laterality: N/A;   IVC FILTER REMOVAL N/A 01/18/2023   Procedure: IVC FILTER REMOVAL;  Surgeon: Maeola Harman, MD;  Location: [facility] INVASIVE CV LAB;  Service: Cardiovascular;  Laterality: N/A;   KIDNEY STONE SURGERY     KNEE ARTHROTOMY Left 02/03/2023   Procedure: KNEE ARTHROTOMY WITH SOFT TISSUE REPAIR;  Surgeon: Samson Frederic, MD;  Location: [facility];  Service: Orthopedics;  Laterality: Left;  76   LAPAROSCOPIC NISSEN FUNDOPLICATION     PAROTIDECTOMY     TONSILLECTOMY     TOTAL KNEE ARTHROPLASTY Left 11/04/2022   Procedure: TOTAL KNEE ARTHROPLASTY;  Surgeon: Jene Every, MD;  Location: [facility];  Service: Orthopedics;  Laterality: Left;   Social History   Socioeconomic History   Marital status: Married    Spouse name: Not on file   Number of children: Not  on file   Years of education: Not on file   Highest education level: Not on file  Occupational History   Not on file  Tobacco Use   Smoking status: Former    Current packs/day: 0.00    Average packs/day: 0.5 packs/day for 10.0 years (5.0 ttl pk-yrs)    Types: Cigarettes    Start date: 02/13/2011    Quit date: 02/12/2021    Years since quitting: 2.5   Smokeless tobacco: Never  Vaping Use   Vaping status: Never Used  Substance and Sexual Activity   Alcohol use: Not Currently   Drug use: Not Currently   Sexual activity: Not Currently  Other Topics Concern   Not on file  Social History Narrative   Not on file   Social Determinants of Health   Financial Resource Strain: Low Risk  (04/23/2021)   Received from [facility], [facility]   Overall Financial Resource Strain (CARDIA)    Difficulty of Paying Living Expenses: Not hard at all  Food Insecurity: No Food Insecurity (03/08/2023)   Hunger Vital Sign    Worried About Running Out of Food in the Last Year: Never true    Ran Out of Food in the Last Year: Never true  Transportation Needs: No Transportation Needs (03/08/2023)   PRAPARE - Administrator, Civil Service (Medical): No    Lack of Transportation (Non-Medical): No  Physical Activity: Not on file  Stress: Stress Concern Present (04/23/2021)   Received from [facility] [place] [facility] Occupational [facility] - Occupational Stress Questionnaire    Feeling of Stress : To some extent  Social Connections: Unknown (04/16/2022)   Received from [facility], [facility]   Social Network    Social Network: Not on file   Family History  Problem Relation Age of Onset   Hypertension Mother    Allergies  Allergen Reactions   Lyrica [Pregabalin] Other (See Comments)    Paralysis present (finding)- not tolerated Pt stated she could not move her body but she could think      Belbuca [Buprenorphine Hcl] Other (See Comments)    " Horrible  headache"    Compazine [Prochlorperazine] Anxiety   Droperidol Anxiety   Ergotamine-Caffeine Anxiety and Hypertension    Increased systolic arterial pressure (finding)    Prior to Admission medications   Medication Sig Start Date End Date Taking? Authorizing Provider  albuterol (VENTOLIN HFA) 108 (90 Base) MCG/ACT inhaler Inhale 2 puffs into the lungs every 6 (six) hours as needed for wheezing or shortness of breath. 05/12/21   [provider]  ALPRAZolam Prudy Feeler) 1 MG tablet Take 1 tablet (1 mg total) by mouth every 6 (six) hours as needed for anxiety or sleep. 03/16/23   Cassandria Anger, PA-C  ascorbic acid (VITAMIN C) 500 MG tablet Take 500 mg by mouth daily.    [provider]  b complex vitamins capsule Take 1 capsule by mouth daily.    [provider]  cholecalciferol (VITAMIN D3) 25 MCG (1000 UNIT) tablet  Take 1,000 Units by mouth daily.    [provider]  clotrimazole-betamethasone (LOTRISONE) cream Apply 1 Application topically 2 (two) times daily as needed (for irritation under the breasts or abdominal folds).    [provider]  diphenhydrAMINE (BENADRYL) 25 MG tablet Take 50-75 mg by mouth every 6 (six) hours as needed for allergies.    [provider]  DULoxetine (CYMBALTA) 60 MG capsule Take 60 mg by mouth at bedtime. 09/14/18   [provider]  famotidine (PEPCID) 20 MG tablet TAKE 1 TABLET BY MOUTH AFTER SUPPER Patient taking differently: Take 20 mg by mouth at bedtime. 04/01/23   [name], MD  Ferrous Fumarate-Folic Acid 324-1 MG TABS Take 1 tablet by mouth daily. Hematinic 02/29/20   [provider]  gabapentin (NEURONTIN) 400 MG capsule Take 400 mg by mouth 2 (two) times daily. 08/21/22   [provider]  naloxone [facility]) nasal spray 4 mg/0.1 mL Place 1 spray into the nose once as needed (accidental over dose). 10/16/21   [provider]  nutrition supplement, JUVEN, (JUVEN) PACK Take 1  packet by mouth 2 (two) times daily between meals.    [provider]  ondansetron (ZOFRAN) 4 MG tablet Take 1 tablet (4 mg total) by mouth every 8 (eight) hours as needed for nausea or vomiting. 02/05/23 02/05/24  Clois Dupes, PA-C  oxyCODONE (OXY IR/ROXICODONE) 5 MG immediate release tablet Take 5 mg by mouth daily as needed for severe pain. Patient not taking: Reported on 08/10/2023 07/28/23   [provider]  pantoprazole (PROTONIX) 40 MG tablet Take 40 mg by mouth daily with breakfast. 06/30/16   [provider]  polyethylene glycol (MIRALAX / GLYCOLAX) 17 g packet Take 17 g by mouth daily as needed for mild constipation. 03/16/23   Cassandria Anger, PA-C  promethazine (PHENERGAN) 25 MG tablet Take 25 mg by mouth every 6 (six) hours as needed for nausea or vomiting.    [provider]  tiZANidine (ZANAFLEX) 2 MG tablet Take 2 mg by mouth at bedtime. May take 2 additional doses during the day if needed for muscle spasms 03/31/23   [provider]  XARELTO 20 MG TABS tablet Take 20 mg by mouth at bedtime. 08/21/21   [provider]  zinc gluconate 50 MG tablet Take 50 mg by mouth daily.    [provider]     Positive ROS: All other systems have been reviewed and were otherwise negative with the exception of those mentioned in the HPI and as above.  Physical Exam: General: Alert, no acute distress Cardiovascular: No pedal edema Respiratory: No cyanosis, no use of accessory musculature GI: No organomegaly, abdomen is soft and non-tender Skin: No lesions in the area of chief complaint Neurologic: Sensation intact distally Psychiatric: Patient is competent for consent with normal mood and affect Lymphatic: No axillary or cervical lymphadenopathy  MUSCULOSKELETAL:   Examination of the left knee reveals that she has a knee immobilizer intact. We removed the knee immobilizer. Her incision is healed. There is no dehiscence or drainage. No  subcutaneous fluid collection.   Distally, there is no focal motor or sensory deficit. She has palpable pedal pulses.  No significant pedal edema. No calf tenderness to palpation.  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   The risks benefits and alternatives were discussed with the patient including but not limited  to the risks of nonoperative treatment, versus surgical intervention including infection, bleeding, nerve injury,  blood clots, cardiopulmonary complications, morbidity, mortality, among others, and they were willing to proceed.   Therapy Plans: outpatient therapy. 1st PT visit 08/30/23 EO [place].  Disposition: Home with husband Planned DVT Prophylaxis: Xarelto 20mg  daily DME needed: Has rolling walker.  PCP: Cleared.  TXA: IV Allergies:  - Belbuca - headache - Cafergot - elevated BP - Compazine - anxiety - Droperidol - anxiety - Lyrica - anxiety - Prochlorperazine - anxiety.  Anesthesia Concerns: None.  BMI: 41.1 Last HgbA1c: Not diabetic Other: - History of PE, chronic xarelto and aspirin use. IVC filter placed 08/09/23. Stop xarelto 3 days prior to surgery.  - COPD - OSA - CAD.  - Juven drink supplement for wound healing.  - Vitamin C and zinc.  - Oxycodone, zofran, methocarbamol, meloxicam. - Nasal swab positive.  - 08/04/23: Hgb 14.9, K+ 4.0, Cr. 0.61.     Clois Dupes, PA-C [phone]   08/24/2023 10:49 AM

## 2023-08-24 NOTE — H&P (Cosign Needed Addendum)
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 female who presents for preoperative history and physical with a complex history regarding her left knee. She underwent primary left total knee arthroplasty by Dr. Shelle Iron on 11/04/2022. She subsequently fell, sustaining a atraumatic dehiscence of her incision on 12/11/2022. Dr. Aundria Rud was on-call and took her to the operating room for debridement and closure. 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 Dr. Shelle Iron worked her up for infection which was completely negative. On 02/03/2023 Dr. Linna Caprice took her to the OR for repair of her arthrotomy. She had issues with delayed wound healing. 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 She again had delayed wound healing. Infection cleared. Patient presents to proceed with resection of left knee antibiotic spacer and revision left total knee arthroplasty.       Past Medical History:  Diagnosis Date   Acute respiratory failure with hypoxia (HCC) 03/23/2022   Acute respiratory failure with hypoxia (HCC) 03/23/2022   Anemia    Anxiety    Arthritis    CAD (coronary artery disease)    pt deneies   COPD (chronic obstructive pulmonary disease) (HCC)    Depression    Dyspnea    Fibromyalgia    Generalized anxiety disorder 12/11/2022   GERD (gastroesophageal reflux disease)    Hiatal hernia    History of kidney stones    Lung nodule 03/24/2022   Migraine headache    Multinodular goiter    Nephrolithiasis    Obstructive sleep apnea syndrome 07/01/2021   Opioid dependence (HCC) 03/23/2022   Pneumonia    Pulmonary embolism (HCC) 1996   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   Sleep apnea    Thyroid nodule    Past Surgical History:  Procedure Laterality Date   ABDOMINAL HYSTERECTOMY     BIOPSY THYROID     EXCISIONAL TOTAL KNEE ARTHROPLASTY WITH ANTIBIOTIC SPACERS Left 03/10/2023   Procedure: Irrigation and debridement left knee, resection total knee arthroplasty, antibiotic spacer placement;  Surgeon: Samson Frederic, MD;  Location: [facility];  Service: Orthopedics;  Laterality: Left;   HERNIA REPAIR     I & D EXTREMITY Left 12/11/2022   Procedure: IRRIGATION AND DEBRIDEMENT EXTREMITY;  Surgeon: Yolonda Kida, MD;  Location: [facility];  Service: Orthopedics;  Laterality: Left;   IVC FILTER INSERTION N/A 09/28/2022   Procedure: IVC FILTER INSERTION;  Surgeon: Maeola Harman, MD;  Location: [facility] INVASIVE CV LAB;  Service: Cardiovascular;  Laterality: N/A;   IVC FILTER INSERTION N/A 08/09/2023   Procedure: IVC FILTER INSERTION;  Surgeon: Maeola Harman, MD;  Location: [facility] INVASIVE CV LAB;  Service: Cardiovascular;  Laterality: N/A;   IVC FILTER REMOVAL N/A 01/18/2023   Procedure: IVC FILTER REMOVAL;  Surgeon: Maeola Harman, MD;  Location: [facility] INVASIVE CV LAB;  Service: Cardiovascular;  Laterality: N/A;   KIDNEY STONE SURGERY     KNEE ARTHROTOMY Left 02/03/2023   Procedure: KNEE ARTHROTOMY WITH SOFT TISSUE REPAIR;  Surgeon: Samson Frederic, MD;  Location: [facility];  Service: Orthopedics;  Laterality: Left;  76   LAPAROSCOPIC NISSEN FUNDOPLICATION     PAROTIDECTOMY     TONSILLECTOMY     TOTAL KNEE ARTHROPLASTY Left 11/04/2022   Procedure: TOTAL KNEE ARTHROPLASTY;  Surgeon: Jene Every, MD;  Location: [facility];  Service: Orthopedics;  Laterality: Left;   Social History   Socioeconomic History   Marital status: Married    Spouse name: Not on file   Number of children: Not  on file   Years of education: Not on file   Highest education level: Not on file  Occupational History   Not on file  Tobacco Use   Smoking status: Former    Current packs/day: 0.00    Average packs/day: 0.5 packs/day for 10.0 years (5.0 ttl pk-yrs)    Types: Cigarettes    Start date: 02/13/2011    Quit date: 02/12/2021    Years since quitting: 2.5   Smokeless tobacco: Never  Vaping Use   Vaping status: Never Used  Substance and Sexual Activity   Alcohol use: Not Currently   Drug use: Not Currently   Sexual activity: Not Currently  Other Topics Concern   Not on file  Social History Narrative   Not on file   Social Determinants of Health   Financial Resource Strain: Low Risk  (04/23/2021)   Received from [facility], [facility]   Overall Financial Resource Strain (CARDIA)    Difficulty of Paying Living Expenses: Not hard at all  Food Insecurity: No Food Insecurity (03/08/2023)   Hunger Vital Sign    Worried About Running Out of Food in the Last Year: Never true    Ran Out of Food in the Last Year: Never true  Transportation Needs: No Transportation Needs (03/08/2023)   PRAPARE - Administrator, Civil Service (Medical): No    Lack of Transportation (Non-Medical): No  Physical Activity: Not on file  Stress: Stress Concern Present (04/23/2021)   Received from [facility] of Occupational Health - Occupational Stress Questionnaire    Feeling of Stress : To some extent  Social Connections: Unknown (04/16/2022)   Received from [facility], [facility]   Social Network    Social Network: Not on file   Family History  Problem Relation Age of Onset   Hypertension Mother    Allergies  Allergen Reactions   Lyrica [Pregabalin] Other (See Comments)    Paralysis present (finding)- not tolerated Pt stated she could not move her body but she could think      Belbuca [Buprenorphine Hcl] Other (See Comments)    " Horrible  headache"    Compazine [Prochlorperazine] Anxiety   Droperidol Anxiety   Ergotamine-Caffeine Anxiety and Hypertension    Increased systolic arterial pressure (finding)    Prior to Admission medications   Medication Sig Start Date End Date Taking? Authorizing Provider  albuterol (VENTOLIN HFA) 108 (90 Base) MCG/ACT inhaler Inhale 2 puffs into the lungs every 6 (six) hours as needed for wheezing or shortness of breath. 05/12/21   [provider]  ALPRAZolam Prudy Feeler) 1 MG tablet Take 1 tablet (1 mg total) by mouth every 6 (six) hours as needed for anxiety or sleep. 03/16/23   Cassandria Anger, PA-C  ascorbic acid (VITAMIN C) 500 MG tablet Take 500 mg by mouth daily.    [provider]  b complex vitamins capsule Take 1 capsule by mouth daily.    [provider]  cholecalciferol (VITAMIN D3) 25 MCG (1000 UNIT) tablet  Take 1,000 Units by mouth daily.    [provider]  clotrimazole-betamethasone (LOTRISONE) cream Apply 1 Application topically 2 (two) times daily as needed (for irritation under the breasts or abdominal folds).    [provider]  diphenhydrAMINE (BENADRYL) 25 MG tablet Take 50-75 mg by mouth every 6 (six) hours as needed for allergies.    [provider]  DULoxetine (CYMBALTA) 60 MG capsule Take 60 mg by mouth at bedtime. 09/14/18   [provider]  famotidine (PEPCID) 20 MG tablet TAKE 1 TABLET BY MOUTH AFTER SUPPER Patient taking differently: Take 20 mg by mouth at bedtime. 04/01/23   [name], MD  Ferrous Fumarate-Folic Acid 324-1 MG TABS Take 1 tablet by mouth daily. Hematinic 02/29/20   [provider]  gabapentin (NEURONTIN) 400 MG capsule Take 400 mg by mouth 2 (two) times daily. 08/21/22   [provider]  naloxone [facility]) nasal spray 4 mg/0.1 mL Place 1 spray into the nose once as needed (accidental over dose). 10/16/21   [provider]  nutrition supplement, JUVEN, (JUVEN) PACK Take 1  packet by mouth 2 (two) times daily between meals.    [provider]  ondansetron (ZOFRAN) 4 MG tablet Take 1 tablet (4 mg total) by mouth every 8 (eight) hours as needed for nausea or vomiting. 02/05/23 02/05/24  Clois Dupes, PA-C  oxyCODONE (OXY IR/ROXICODONE) 5 MG immediate release tablet Take 5 mg by mouth daily as needed for severe pain. Patient not taking: Reported on 08/10/2023 07/28/23   [provider]  pantoprazole (PROTONIX) 40 MG tablet Take 40 mg by mouth daily with breakfast. 06/30/16   [provider]  polyethylene glycol (MIRALAX / GLYCOLAX) 17 g packet Take 17 g by mouth daily as needed for mild constipation. 03/16/23   Cassandria Anger, PA-C  promethazine (PHENERGAN) 25 MG tablet Take 25 mg by mouth every 6 (six) hours as needed for nausea or vomiting.    [provider]  tiZANidine (ZANAFLEX) 2 MG tablet Take 2 mg by mouth at bedtime. May take 2 additional doses during the day if needed for muscle spasms 03/31/23   [provider]  XARELTO 20 MG TABS tablet Take 20 mg by mouth at bedtime. 08/21/21   [provider]  zinc gluconate 50 MG tablet Take 50 mg by mouth daily.    [provider]     Positive ROS: All other systems have been reviewed and were otherwise negative with the exception of those mentioned in the HPI and as above.  Physical Exam: General: Alert, no acute distress Cardiovascular: No pedal edema Respiratory: No cyanosis, no use of accessory musculature GI: No organomegaly, abdomen is soft and non-tender Skin: No lesions in the area of chief complaint Neurologic: Sensation intact distally Psychiatric: Patient is competent for consent with normal mood and affect Lymphatic: No axillary or cervical lymphadenopathy  MUSCULOSKELETAL:   Examination of the left knee reveals that she has a knee immobilizer intact. We removed the knee immobilizer. Her incision is healed. There is no dehiscence or drainage. No  subcutaneous fluid collection.   Distally, there is no focal motor or sensory deficit. She has palpable pedal pulses.  No significant pedal edema. No calf tenderness to palpation.  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   The risks benefits and alternatives were discussed with the patient including but not limited  to the risks of nonoperative treatment, versus surgical intervention including infection, bleeding, nerve injury,  blood clots, cardiopulmonary complications, morbidity, mortality, among others, and they were willing to proceed.   Therapy Plans: outpatient therapy. 1st PT visit 08/30/23 EO [place].  Disposition: Home with husband Planned DVT Prophylaxis: Xarelto 20mg  daily DME needed: Has rolling walker.  PCP: Cleared.  TXA: IV Allergies:  - Belbuca - headache - Cafergot - elevated BP - Compazine - anxiety - Droperidol - anxiety - Lyrica - anxiety - Prochlorperazine - anxiety.  Anesthesia Concerns: None.  BMI: 41.1 Last HgbA1c: Not diabetic Other: - History of PE, chronic xarelto and aspirin use. IVC filter placed 08/09/23. Stop xarelto 3 days prior to surgery.  - COPD - OSA - CAD.  - Juven drink supplement for wound healing.  - Vitamin C and zinc.  - Oxycodone, zofran, methocarbamol, meloxicam. - Nasal swab positive.  - 08/04/23: Hgb 14.9, K+ 4.0, Cr. 0.61.     Clois Dupes, PA-C [phone]   08/24/2023 10:49 AM

## 2023-08-25 ENCOUNTER — Encounter (HOSPITAL_COMMUNITY): Payer: Self-pay | Admitting: Orthopedic Surgery

## 2023-08-25 ENCOUNTER — Other Ambulatory Visit: Payer: Self-pay

## 2023-08-25 ENCOUNTER — Inpatient Hospital Stay (HOSPITAL_COMMUNITY): Payer: Medicare Other | Admitting: Certified Registered Nurse Anesthetist

## 2023-08-25 ENCOUNTER — Inpatient Hospital Stay (HOSPITAL_COMMUNITY): Payer: Medicare Other | Admitting: Physician Assistant

## 2023-08-25 ENCOUNTER — Inpatient Hospital Stay (HOSPITAL_COMMUNITY): Payer: Medicare Other

## 2023-08-25 ENCOUNTER — Inpatient Hospital Stay (HOSPITAL_COMMUNITY)
Admission: RE | Admit: 2023-08-25 | Discharge: 2023-08-31 | DRG: 467 | Disposition: A | Discharge status: Home Health | Payer: Medicare Other | Attending: Orthopedic Surgery | Admitting: Orthopedic Surgery

## 2023-08-25 ENCOUNTER — Encounter (HOSPITAL_COMMUNITY)
Admission: RE | Disposition: A | Discharge status: Home Health | Payer: Self-pay | Source: Home / Self Care | Attending: Orthopedic Surgery

## 2023-08-25 DIAGNOSIS — J449 Chronic obstructive pulmonary disease, unspecified: Secondary | ICD-10-CM | POA: Diagnosis present

## 2023-08-25 DIAGNOSIS — R200 Anesthesia of skin: Secondary | ICD-10-CM | POA: Diagnosis not present

## 2023-08-25 DIAGNOSIS — Z87442 Personal history of urinary calculi: Secondary | ICD-10-CM

## 2023-08-25 DIAGNOSIS — Z79899 Other long term (current) drug therapy: Secondary | ICD-10-CM | POA: Diagnosis not present

## 2023-08-25 DIAGNOSIS — Z8249 Family history of ischemic heart disease and other diseases of the circulatory system: Secondary | ICD-10-CM

## 2023-08-25 DIAGNOSIS — Z888 Allergy status to other drugs, medicaments and biological substances status: Secondary | ICD-10-CM | POA: Diagnosis not present

## 2023-08-25 DIAGNOSIS — Z7901 Long term (current) use of anticoagulants: Secondary | ICD-10-CM | POA: Diagnosis not present

## 2023-08-25 DIAGNOSIS — Z87891 Personal history of nicotine dependence: Secondary | ICD-10-CM

## 2023-08-25 DIAGNOSIS — T8459XA Infection and inflammatory reaction due to other internal joint prosthesis, initial encounter: Principal | ICD-10-CM

## 2023-08-25 DIAGNOSIS — M797 Fibromyalgia: Secondary | ICD-10-CM | POA: Diagnosis present

## 2023-08-25 DIAGNOSIS — G4733 Obstructive sleep apnea (adult) (pediatric): Secondary | ICD-10-CM

## 2023-08-25 DIAGNOSIS — Z86711 Personal history of pulmonary embolism: Secondary | ICD-10-CM | POA: Diagnosis not present

## 2023-08-25 DIAGNOSIS — Z90710 Acquired absence of both cervix and uterus: Secondary | ICD-10-CM

## 2023-08-25 DIAGNOSIS — Z96652 Presence of left artificial knee joint: Secondary | ICD-10-CM

## 2023-08-25 DIAGNOSIS — F411 Generalized anxiety disorder: Secondary | ICD-10-CM | POA: Diagnosis present

## 2023-08-25 DIAGNOSIS — D62 Acute posthemorrhagic anemia: Secondary | ICD-10-CM | POA: Diagnosis not present

## 2023-08-25 DIAGNOSIS — T8454XA Infection and inflammatory reaction due to internal left knee prosthesis, initial encounter: Secondary | ICD-10-CM | POA: Diagnosis present

## 2023-08-25 DIAGNOSIS — F32A Depression, unspecified: Secondary | ICD-10-CM | POA: Diagnosis present

## 2023-08-25 DIAGNOSIS — I2510 Atherosclerotic heart disease of native coronary artery without angina pectoris: Secondary | ICD-10-CM | POA: Diagnosis present

## 2023-08-25 DIAGNOSIS — T8453XD Infection and inflammatory reaction due to internal right knee prosthesis, subsequent encounter: Secondary | ICD-10-CM

## 2023-08-25 DIAGNOSIS — M96662 Fracture of femur following insertion of orthopedic implant, joint prosthesis, or bone plate, left leg: Secondary | ICD-10-CM | POA: Diagnosis not present

## 2023-08-25 DIAGNOSIS — Y831 Surgical operation with implant of artificial internal device as the cause of abnormal reaction of the patient, or of later complication, without mention of misadventure at the time of the procedure: Secondary | ICD-10-CM | POA: Diagnosis present

## 2023-08-25 DIAGNOSIS — Z6841 Body Mass Index (BMI) 40.0 and over, adult: Secondary | ICD-10-CM

## 2023-08-25 DIAGNOSIS — Z7982 Long term (current) use of aspirin: Secondary | ICD-10-CM | POA: Diagnosis not present

## 2023-08-25 DIAGNOSIS — Z96659 Presence of unspecified artificial knee joint: Secondary | ICD-10-CM

## 2023-08-25 HISTORY — PX: TOTAL KNEE REVISION: SHX996

## 2023-08-25 HISTORY — PX: REMOVAL OF CEMENTED SPACER KNEE: SHX6056

## 2023-08-25 LAB — CBC WITH DIFFERENTIAL/PLATELET
Abs Immature Granulocytes: 0.04 10*3/uL (ref 0.00–0.07)
Basophils Absolute: 0.1 10*3/uL (ref 0.0–0.1)
Basophils Relative: 1 %
Eosinophils Absolute: 0.4 10*3/uL (ref 0.0–0.5)
Eosinophils Relative: 4 %
HCT: 45.4 % (ref 36.0–46.0)
Hemoglobin: 14.1 g/dL (ref 12.0–15.0)
Immature Granulocytes: 0 %
Lymphocytes Relative: 35 %
Lymphs Abs: 3.6 10*3/uL (ref 0.7–4.0)
MCH: 30.7 pg (ref 26.0–34.0)
MCHC: 31.1 g/dL (ref 30.0–36.0)
MCV: 98.7 fL (ref 80.0–100.0)
Monocytes Absolute: 0.9 10*3/uL (ref 0.1–1.0)
Monocytes Relative: 9 %
Neutro Abs: 5.3 10*3/uL (ref 1.7–7.7)
Neutrophils Relative %: 51 %
Platelets: 188 10*3/uL (ref 150–400)
RBC: 4.6 MIL/uL (ref 3.87–5.11)
RDW: 14.3 % (ref 11.5–15.5)
WBC: 10.3 10*3/uL (ref 4.0–10.5)
nRBC: 0 % (ref 0.0–0.2)

## 2023-08-25 SURGERY — REMOVAL, SPACER, KNEE
Anesthesia: Spinal | Site: Knee | Laterality: Left

## 2023-08-25 MED ORDER — PROPOFOL 10 MG/ML IV BOLUS
INTRAVENOUS | Status: AC
  Filled 2023-08-25: qty 20

## 2023-08-25 MED ORDER — VANCOMYCIN HCL IN DEXTROSE 1-5 GM/200ML-% IV SOLN
INTRAVENOUS | Status: AC
  Filled 2023-08-25: qty 200

## 2023-08-25 MED ORDER — POVIDONE-IODINE 10 % EX SWAB
2.0000 | Freq: Once | CUTANEOUS | Status: AC
  Administered 2023-08-25: 2 via TOPICAL

## 2023-08-25 MED ORDER — ACETAMINOPHEN 500 MG PO TABS
ORAL_TABLET | ORAL | Status: AC
  Filled 2023-08-25: qty 2

## 2023-08-25 MED ORDER — KETOROLAC TROMETHAMINE 30 MG/ML IJ SOLN
INTRAMUSCULAR | Status: AC
  Filled 2023-08-25: qty 1

## 2023-08-25 MED ORDER — PHENOL 1.4 % MT LIQD: 1.0000 | OROMUCOSAL | Status: DC | PRN

## 2023-08-25 MED ORDER — DULOXETINE HCL 60 MG PO CPEP
60.0000 mg | ORAL_CAPSULE | Freq: Every day | ORAL | Status: DC
  Administered 2023-08-26 – 2023-08-30 (×6): 60 mg via ORAL
  Filled 2023-08-25 (×6): qty 1

## 2023-08-25 MED ORDER — CEFAZOLIN SODIUM-DEXTROSE 2-4 GM/100ML-% IV SOLN
2.0000 g | INTRAVENOUS | Status: AC
  Administered 2023-08-25 (×2): 2 g via INTRAVENOUS
  Filled 2023-08-25: qty 100

## 2023-08-25 MED ORDER — ROCURONIUM BROMIDE 10 MG/ML (PF) SYRINGE
PREFILLED_SYRINGE | INTRAVENOUS | Status: AC
  Filled 2023-08-25: qty 10

## 2023-08-25 MED ORDER — MENTHOL 3 MG MT LOZG: 1.0000 | LOZENGE | OROMUCOSAL | Status: DC | PRN

## 2023-08-25 MED ORDER — LACTATED RINGERS IV SOLN: INTRAVENOUS | Status: DC

## 2023-08-25 MED ORDER — METHOCARBAMOL 500 MG PO TABS
500.0000 mg | ORAL_TABLET | Freq: Four times a day (QID) | ORAL | Status: DC | PRN
  Administered 2023-08-25 – 2023-08-31 (×7): 500 mg via ORAL
  Filled 2023-08-25 (×8): qty 1

## 2023-08-25 MED ORDER — PROMETHAZINE HCL 25 MG PO TABS: 25.0000 mg | ORAL_TABLET | Freq: Four times a day (QID) | ORAL | Status: DC | PRN

## 2023-08-25 MED ORDER — CEFAZOLIN SODIUM-DEXTROSE 2-4 GM/100ML-% IV SOLN
INTRAVENOUS | Status: AC
  Administered 2023-08-25: 2 g via INTRAVENOUS
  Filled 2023-08-25: qty 100

## 2023-08-25 MED ORDER — CEFAZOLIN SODIUM 1 G IJ SOLR
INTRAMUSCULAR | Status: AC
  Filled 2023-08-25: qty 20

## 2023-08-25 MED ORDER — METHOCARBAMOL 500 MG IVPB - SIMPLE MED
500.0000 mg | Freq: Four times a day (QID) | INTRAVENOUS | Status: DC | PRN
  Administered 2023-08-25: 500 mg via INTRAVENOUS

## 2023-08-25 MED ORDER — LIDOCAINE 2% (20 MG/ML) 5 ML SYRINGE
INTRAMUSCULAR | Status: DC | PRN
  Administered 2023-08-25: 40 mg via INTRAVENOUS

## 2023-08-25 MED ORDER — ZINC GLUCONATE 50 MG PO TABS: 50.0000 mg | ORAL_TABLET | Freq: Every day | ORAL | Status: DC

## 2023-08-25 MED ORDER — HYDROMORPHONE HCL 1 MG/ML IJ SOLN
0.5000 mg | INTRAMUSCULAR | Status: DC | PRN
  Administered 2023-08-25 – 2023-08-26 (×2): 1 mg via INTRAVENOUS
  Filled 2023-08-25: qty 1

## 2023-08-25 MED ORDER — LIDOCAINE HCL (PF) 2 % IJ SOLN
INTRAMUSCULAR | Status: AC
  Filled 2023-08-25: qty 5

## 2023-08-25 MED ORDER — FE FUM-VIT C-VIT B12-FA 460-60-0.01-1 MG PO CAPS
1.0000 | ORAL_CAPSULE | Freq: Every day | ORAL | Status: DC
  Administered 2023-08-26 – 2023-08-31 (×6): 1 via ORAL
  Filled 2023-08-25 (×6): qty 1

## 2023-08-25 MED ORDER — POLYETHYLENE GLYCOL 3350 17 G PO PACK: 17.0000 g | PACK | Freq: Every day | ORAL | Status: DC | PRN

## 2023-08-25 MED ORDER — OXYCODONE HCL 5 MG PO TABS
5.0000 mg | ORAL_TABLET | ORAL | Status: DC | PRN
  Administered 2023-08-26 – 2023-08-31 (×11): 10 mg via ORAL
  Filled 2023-08-25 (×14): qty 2

## 2023-08-25 MED ORDER — MIDAZOLAM HCL 2 MG/2ML IJ SOLN: 1.0000 mg | Freq: Once | INTRAMUSCULAR | Status: DC

## 2023-08-25 MED ORDER — VANCOMYCIN HCL 1000 MG IV SOLR
INTRAVENOUS | Status: DC | PRN
  Administered 2023-08-25: 1000 mg via INTRAVENOUS

## 2023-08-25 MED ORDER — SUGAMMADEX SODIUM 200 MG/2ML IV SOLN
INTRAVENOUS | Status: DC | PRN
  Administered 2023-08-25: 200 mg via INTRAVENOUS

## 2023-08-25 MED ORDER — ACETAMINOPHEN 325 MG PO TABS
325.0000 mg | ORAL_TABLET | Freq: Four times a day (QID) | ORAL | Status: DC | PRN
  Administered 2023-08-26 – 2023-08-31 (×4): 650 mg via ORAL
  Filled 2023-08-25 (×4): qty 2

## 2023-08-25 MED ORDER — ALPRAZOLAM 0.5 MG PO TABS
1.0000 mg | ORAL_TABLET | Freq: Four times a day (QID) | ORAL | Status: DC | PRN
  Administered 2023-08-25 – 2023-08-29 (×4): 1 mg via ORAL
  Filled 2023-08-25 (×2): qty 2
  Filled 2023-08-25: qty 1
  Filled 2023-08-25 (×2): qty 2

## 2023-08-25 MED ORDER — ROCURONIUM BROMIDE 10 MG/ML (PF) SYRINGE
PREFILLED_SYRINGE | INTRAVENOUS | Status: DC | PRN
  Administered 2023-08-25 (×2): 10 mg via INTRAVENOUS
  Administered 2023-08-25: 20 mg via INTRAVENOUS
  Administered 2023-08-25: 50 mg via INTRAVENOUS
  Administered 2023-08-25: 10 mg via INTRAVENOUS

## 2023-08-25 MED ORDER — CEFAZOLIN SODIUM-DEXTROSE 2-4 GM/100ML-% IV SOLN
2.0000 g | Freq: Four times a day (QID) | INTRAVENOUS | Status: AC
  Administered 2023-08-26: 2 g via INTRAVENOUS
  Filled 2023-08-25: qty 100

## 2023-08-25 MED ORDER — BUPIVACAINE-EPINEPHRINE 0.25% -1:200000 IJ SOLN
INTRAMUSCULAR | Status: AC
  Filled 2023-08-25: qty 1

## 2023-08-25 MED ORDER — CHLORHEXIDINE GLUCONATE 0.12 % MT SOLN
15.0000 mL | Freq: Once | OROMUCOSAL | Status: AC
  Administered 2023-08-25: 15 mL via OROMUCOSAL

## 2023-08-25 MED ORDER — DOCUSATE SODIUM 100 MG PO CAPS
100.0000 mg | ORAL_CAPSULE | Freq: Two times a day (BID) | ORAL | Status: DC
  Administered 2023-08-26 – 2023-08-31 (×11): 100 mg via ORAL
  Filled 2023-08-25 (×11): qty 1

## 2023-08-25 MED ORDER — NYSTATIN 100000 UNIT/GM EX POWD
Freq: Two times a day (BID) | CUTANEOUS | Status: DC
  Administered 2023-08-26 – 2023-08-30 (×2): 1 via TOPICAL
  Filled 2023-08-25: qty 15

## 2023-08-25 MED ORDER — TRANEXAMIC ACID-NACL 1000-0.7 MG/100ML-% IV SOLN
1000.0000 mg | INTRAVENOUS | Status: AC
  Administered 2023-08-25: 1000 mg via INTRAVENOUS
  Filled 2023-08-25: qty 100

## 2023-08-25 MED ORDER — PANTOPRAZOLE SODIUM 40 MG PO TBEC
40.0000 mg | DELAYED_RELEASE_TABLET | Freq: Every day | ORAL | Status: DC
  Administered 2023-08-26 – 2023-08-31 (×6): 40 mg via ORAL
  Filled 2023-08-25 (×6): qty 1

## 2023-08-25 MED ORDER — 0.9 % SODIUM CHLORIDE (POUR BTL) OPTIME
TOPICAL | Status: DC | PRN
  Administered 2023-08-25: 1000 mL

## 2023-08-25 MED ORDER — METOCLOPRAMIDE HCL 5 MG/ML IJ SOLN: 5.0000 mg | Freq: Three times a day (TID) | INTRAMUSCULAR | Status: DC | PRN

## 2023-08-25 MED ORDER — HYDROMORPHONE HCL 1 MG/ML IJ SOLN
INTRAMUSCULAR | Status: AC
  Filled 2023-08-25: qty 1

## 2023-08-25 MED ORDER — FENTANYL CITRATE (PF) 100 MCG/2ML IJ SOLN
INTRAMUSCULAR | Status: AC
  Filled 2023-08-25: qty 2

## 2023-08-25 MED ORDER — RIVAROXABAN 10 MG PO TABS
10.0000 mg | ORAL_TABLET | Freq: Every day | ORAL | Status: DC
  Administered 2023-08-26 – 2023-08-31 (×6): 10 mg via ORAL
  Filled 2023-08-25 (×6): qty 1

## 2023-08-25 MED ORDER — B COMPLEX-C PO TABS
1.0000 | ORAL_TABLET | Freq: Every day | ORAL | Status: DC
  Administered 2023-08-26 – 2023-08-31 (×6): 1 via ORAL
  Filled 2023-08-25 (×6): qty 1

## 2023-08-25 MED ORDER — METHOCARBAMOL 500 MG IVPB - SIMPLE MED
INTRAVENOUS | Status: AC
  Filled 2023-08-25: qty 55

## 2023-08-25 MED ORDER — ROPIVACAINE HCL 5 MG/ML IJ SOLN
INTRAMUSCULAR | Status: DC | PRN
Start: 2023-08-25 — End: 2023-08-25
  Administered 2023-08-25: 30 mL via PERINEURAL

## 2023-08-25 MED ORDER — VITAMIN C 500 MG PO TABS
500.0000 mg | ORAL_TABLET | Freq: Every day | ORAL | Status: DC
  Administered 2023-08-26 – 2023-08-31 (×6): 500 mg via ORAL
  Filled 2023-08-25 (×6): qty 1

## 2023-08-25 MED ORDER — OXYCODONE HCL 5 MG PO TABS
10.0000 mg | ORAL_TABLET | ORAL | Status: DC | PRN
  Administered 2023-08-25 – 2023-08-26 (×2): 10 mg via ORAL
  Administered 2023-08-26 – 2023-08-27 (×4): 15 mg via ORAL
  Administered 2023-08-28 – 2023-08-29 (×7): 10 mg via ORAL
  Filled 2023-08-25 (×4): qty 3
  Filled 2023-08-25 (×2): qty 2
  Filled 2023-08-25: qty 3
  Filled 2023-08-25 (×4): qty 2

## 2023-08-25 MED ORDER — STERILE WATER FOR IRRIGATION IR SOLN
Status: DC | PRN
  Administered 2023-08-25: 2000 mL

## 2023-08-25 MED ORDER — PROPOFOL 10 MG/ML IV BOLUS
INTRAVENOUS | Status: DC | PRN
  Administered 2023-08-25: 50 mg via INTRAVENOUS
  Administered 2023-08-25: 80 mg via INTRAVENOUS
  Administered 2023-08-25: 20 mg via INTRAVENOUS
  Administered 2023-08-25: 50 mg via INTRAVENOUS
  Administered 2023-08-25: 20 mg via INTRAVENOUS

## 2023-08-25 MED ORDER — LABETALOL HCL 5 MG/ML IV SOLN
INTRAVENOUS | Status: DC | PRN
Start: 2023-08-25 — End: 2023-08-25
  Administered 2023-08-25: 5 mg via INTRAVENOUS

## 2023-08-25 MED ORDER — ALBUTEROL SULFATE (2.5 MG/3ML) 0.083% IN NEBU: 2.5000 mg | INHALATION_SOLUTION | Freq: Four times a day (QID) | RESPIRATORY_TRACT | Status: DC | PRN

## 2023-08-25 MED ORDER — ACETAMINOPHEN 500 MG PO TABS
1000.0000 mg | ORAL_TABLET | Freq: Four times a day (QID) | ORAL | Status: AC
  Administered 2023-08-25 – 2023-08-26 (×4): 1000 mg via ORAL
  Filled 2023-08-25 (×3): qty 2

## 2023-08-25 MED ORDER — CLOTRIMAZOLE-BETAMETHASONE 1-0.05 % EX CREA: 1.0000 | TOPICAL_CREAM | Freq: Two times a day (BID) | CUTANEOUS | Status: DC | PRN

## 2023-08-25 MED ORDER — ORAL CARE MOUTH RINSE: 15.0000 mL | Freq: Once | OROMUCOSAL | Status: AC

## 2023-08-25 MED ORDER — ONDANSETRON HCL 4 MG/2ML IJ SOLN: 4.0000 mg | Freq: Four times a day (QID) | INTRAMUSCULAR | Status: DC | PRN

## 2023-08-25 MED ORDER — ALUM & MAG HYDROXIDE-SIMETH 200-200-20 MG/5ML PO SUSP: 30.0000 mL | ORAL | Status: DC | PRN

## 2023-08-25 MED ORDER — OXYCODONE HCL 5 MG PO TABS
ORAL_TABLET | ORAL | Status: AC
  Filled 2023-08-25: qty 2

## 2023-08-25 MED ORDER — ONDANSETRON HCL 4 MG/2ML IJ SOLN
INTRAMUSCULAR | Status: AC
  Filled 2023-08-25: qty 2

## 2023-08-25 MED ORDER — DEXAMETHASONE SODIUM PHOSPHATE 10 MG/ML IJ SOLN
INTRAMUSCULAR | Status: DC | PRN
  Administered 2023-08-25: 5 mg via INTRAVENOUS

## 2023-08-25 MED ORDER — SODIUM CHLORIDE 0.9 % IV SOLN: INTRAVENOUS | Status: DC

## 2023-08-25 MED ORDER — HYDROMORPHONE HCL 1 MG/ML IJ SOLN
INTRAMUSCULAR | Status: DC | PRN
  Administered 2023-08-25 (×4): .2 mg via INTRAVENOUS
  Administered 2023-08-25: .5 mg via INTRAVENOUS
  Administered 2023-08-25: .3 mg via INTRAVENOUS
  Administered 2023-08-25 (×2): .2 mg via INTRAVENOUS

## 2023-08-25 MED ORDER — ONDANSETRON HCL 4 MG/2ML IJ SOLN
INTRAMUSCULAR | Status: DC | PRN
  Administered 2023-08-25: 4 mg via INTRAVENOUS

## 2023-08-25 MED ORDER — ALBUTEROL SULFATE HFA 108 (90 BASE) MCG/ACT IN AERS: 2.0000 | INHALATION_SPRAY | Freq: Four times a day (QID) | RESPIRATORY_TRACT | Status: DC | PRN

## 2023-08-25 MED ORDER — POVIDONE-IODINE 10 % EX SWAB: 2.0000 | Freq: Once | CUTANEOUS | Status: DC

## 2023-08-25 MED ORDER — BISACODYL 10 MG RE SUPP: 10.0000 mg | Freq: Every day | RECTAL | Status: DC | PRN

## 2023-08-25 MED ORDER — B COMPLEX VITAMINS PO CAPS: 1.0000 | ORAL_CAPSULE | Freq: Every day | ORAL | Status: DC

## 2023-08-25 MED ORDER — JUVEN PO PACK
1.0000 | PACK | Freq: Two times a day (BID) | ORAL | Status: DC
  Administered 2023-08-26 – 2023-08-31 (×11): 1 via ORAL
  Filled 2023-08-25 (×12): qty 1

## 2023-08-25 MED ORDER — FENTANYL CITRATE (PF) 250 MCG/5ML IJ SOLN
INTRAMUSCULAR | Status: DC | PRN
  Administered 2023-08-25: 50 ug via INTRAVENOUS
  Administered 2023-08-25: 25 ug via INTRAVENOUS
  Administered 2023-08-25 (×2): 50 ug via INTRAVENOUS
  Administered 2023-08-25: 25 ug via INTRAVENOUS

## 2023-08-25 MED ORDER — SENNA 8.6 MG PO TABS
1.0000 | ORAL_TABLET | Freq: Two times a day (BID) | ORAL | Status: DC
  Administered 2023-08-26 – 2023-08-31 (×10): 8.6 mg via ORAL
  Filled 2023-08-25 (×11): qty 1

## 2023-08-25 MED ORDER — METOCLOPRAMIDE HCL 5 MG PO TABS: 5.0000 mg | ORAL_TABLET | Freq: Three times a day (TID) | ORAL | Status: DC | PRN

## 2023-08-25 MED ORDER — DIPHENHYDRAMINE HCL 12.5 MG/5ML PO ELIX
12.5000 mg | ORAL_SOLUTION | ORAL | Status: DC | PRN
  Administered 2023-08-26 – 2023-08-29 (×3): 25 mg via ORAL
  Filled 2023-08-25 (×3): qty 10

## 2023-08-25 MED ORDER — FERROUS FUMARATE-FOLIC ACID 324-1 MG PO TABS: 1.0000 | ORAL_TABLET | Freq: Every day | ORAL | Status: DC

## 2023-08-25 MED ORDER — SODIUM CHLORIDE 0.9 % IR SOLN
Status: DC | PRN
  Administered 2023-08-25: 3000 mL
  Administered 2023-08-25: 1000 mL

## 2023-08-25 MED ORDER — HYDROMORPHONE HCL 2 MG/ML IJ SOLN
INTRAMUSCULAR | Status: AC
  Filled 2023-08-25: qty 1

## 2023-08-25 MED ORDER — KETOROLAC TROMETHAMINE 15 MG/ML IJ SOLN
INTRAMUSCULAR | Status: AC
  Filled 2023-08-25: qty 1

## 2023-08-25 MED ORDER — ACETAMINOPHEN 500 MG PO TABS
1000.0000 mg | ORAL_TABLET | Freq: Once | ORAL | Status: AC
  Administered 2023-08-25: 1000 mg via ORAL
  Filled 2023-08-25: qty 2

## 2023-08-25 MED ORDER — SODIUM CHLORIDE (PF) 0.9 % IJ SOLN
INTRAMUSCULAR | Status: AC
  Filled 2023-08-25: qty 30

## 2023-08-25 MED ORDER — ONDANSETRON HCL 4 MG PO TABS
4.0000 mg | ORAL_TABLET | Freq: Four times a day (QID) | ORAL | Status: DC | PRN
  Administered 2023-08-27 – 2023-08-29 (×2): 4 mg via ORAL
  Filled 2023-08-25 (×2): qty 1

## 2023-08-25 MED ORDER — ISOPROPYL ALCOHOL 70 % SOLN
Status: DC | PRN
  Administered 2023-08-25: 1 via TOPICAL

## 2023-08-25 MED ORDER — NALOXONE HCL 4 MG/0.1ML NA LIQD: 1.0000 | Freq: Once | NASAL | Status: DC | PRN

## 2023-08-25 MED ORDER — GABAPENTIN 400 MG PO CAPS
400.0000 mg | ORAL_CAPSULE | Freq: Two times a day (BID) | ORAL | Status: DC
  Administered 2023-08-25 – 2023-08-31 (×12): 400 mg via ORAL
  Filled 2023-08-25 (×12): qty 1

## 2023-08-25 MED ORDER — DEXAMETHASONE SODIUM PHOSPHATE 10 MG/ML IJ SOLN
INTRAMUSCULAR | Status: AC
  Filled 2023-08-25: qty 1

## 2023-08-25 MED ORDER — FENTANYL CITRATE PF 50 MCG/ML IJ SOSY
50.0000 ug | PREFILLED_SYRINGE | Freq: Once | INTRAMUSCULAR | Status: AC
  Administered 2023-08-25: 50 ug via INTRAVENOUS
  Filled 2023-08-25: qty 2

## 2023-08-25 MED ORDER — KETOROLAC TROMETHAMINE 15 MG/ML IJ SOLN
7.5000 mg | Freq: Four times a day (QID) | INTRAMUSCULAR | Status: AC
  Administered 2023-08-25 – 2023-08-26 (×4): 7.5 mg via INTRAVENOUS
  Filled 2023-08-25 (×2): qty 1

## 2023-08-25 SURGICAL SUPPLY — 107 items
ADH SKN CLS APL DERMABOND .7 (GAUZE/BANDAGES/DRESSINGS) ×2
APL PRP STRL LF DISP 70% ISPRP (MISCELLANEOUS) ×2
AUG FEM DIST KNEE PS 7 5 (Joint) ×2 IMPLANT
AUG FEM MED CONE CNTR STRL LF (Knees) ×1 IMPLANT
AUG FEM SM CONE CNTR STRL LF (Joint) ×1 IMPLANT
AUG TIB HALF BLOCK CD LL 5 (Joint) ×1 IMPLANT
AUG TIB HALF BLOCK CD LM 5 (Joint) ×1 IMPLANT
AUG TIB MED CONE CNTR STRL LF (Joint) ×1 IMPLANT
AUGMENT FEM DIST KNEE PS 7 5 (Joint) IMPLANT
AUGMENT TIB HALF BLC CD LL 5 (Joint) IMPLANT
AUGMENT TIB HALF BLC CD LM 5 (Joint) IMPLANT
BAG COUNTER SPONGE SURGICOUNT (BAG) IMPLANT
BAG DECANTER FOR FLEXI CONT (MISCELLANEOUS) ×4 IMPLANT
BAG SPEC THK2 15X12 ZIP CLS (MISCELLANEOUS) ×1
BAG SPNG CNTER NS LX DISP (BAG)
BAG ZIPLOCK 12X15 (MISCELLANEOUS) ×2 IMPLANT
BLADE SAW RECIPROCATING 77.5 (BLADE) ×2 IMPLANT
BLADE SAW SGTL 13.0X1.19X90.0M (BLADE) IMPLANT
BLADE SAW SGTL 81X20 HD (BLADE) ×2 IMPLANT
BLADE SURG SZ10 CARB STEEL (BLADE) ×2 IMPLANT
BNDG CMPR 5X4 KNIT ELC UNQ LF (GAUZE/BANDAGES/DRESSINGS) ×1
BNDG CMPR 6 X 5 YARDS HK CLSR (GAUZE/BANDAGES/DRESSINGS) ×1
BNDG CMPR MED 15X6 ELC VLCR LF (GAUZE/BANDAGES/DRESSINGS) ×1
BNDG ELASTIC 4INX 5YD STR LF (GAUZE/BANDAGES/DRESSINGS) ×2 IMPLANT
BNDG ELASTIC 6INX 5YD STR LF (GAUZE/BANDAGES/DRESSINGS) ×2 IMPLANT
BNDG ELASTIC 6X15 VLCR STRL LF (GAUZE/BANDAGES/DRESSINGS) IMPLANT
BONE CEMENT SIMPLEX TOBRAMYCIN (Cement) IMPLANT
BUR OVAL CARBIDE 4.0 (BURR) IMPLANT
CABLE CERLAGE W/CRIMP 1.8 (Cable) IMPLANT
CANISTER WOUND CARE 500ML ATS (WOUND CARE) IMPLANT
CEMENT BONE REFOBACIN R1X40 US (Cement) IMPLANT
CEMENT RESTRICTOR BONE PREP ST (Cement) IMPLANT
CHLORAPREP W/TINT 26 (MISCELLANEOUS) ×4 IMPLANT
CONE TIB CENTRAL TM MED (Joint) IMPLANT
COVER SURGICAL LIGHT HANDLE (MISCELLANEOUS) ×2 IMPLANT
CUFF TOURN SGL QUICK 34 (TOURNIQUET CUFF) ×1
CUFF TRNQT CYL 34X4.125X (TOURNIQUET CUFF) ×2 IMPLANT
DERMABOND ADVANCED .7 DNX12 (GAUZE/BANDAGES/DRESSINGS) ×4 IMPLANT
DRAPE INCISE IOBAN 66X45 STRL (DRAPES) ×6 IMPLANT
DRAPE SHEET LG 3/4 BI-LAMINATE (DRAPES) ×6 IMPLANT
DRAPE U-SHAPE 47X51 STRL (DRAPES) ×2 IMPLANT
DRESSING PREVENA PLUS CUSTOM (GAUZE/BANDAGES/DRESSINGS) IMPLANT
DRSG AQUACEL AG ADV 3.5X10 (GAUZE/BANDAGES/DRESSINGS) ×2 IMPLANT
DRSG PREVENA PLUS CUSTOM (GAUZE/BANDAGES/DRESSINGS) ×1
DRSG TEGADERM 4X4.75 (GAUZE/BANDAGES/DRESSINGS) IMPLANT
ELECT BLADE TIP CTD 4 INCH (ELECTRODE) ×2 IMPLANT
ELECT REM PT RETURN 15FT ADLT (MISCELLANEOUS) ×2 IMPLANT
EVACUATOR 1/8 PVC DRAIN (DRAIN) ×2 IMPLANT
EVACUATOR DRAINAGE 7X20 100CC (MISCELLANEOUS) IMPLANT
EVACUATOR SILICONE 100CC (MISCELLANEOUS) ×1
GLOVE BIO SURGEON STRL SZ7 (GLOVE) ×4 IMPLANT
GLOVE BIO SURGEON STRL SZ8.5 (GLOVE) ×4 IMPLANT
GLOVE BIOGEL PI IND STRL 7.5 (GLOVE) ×2 IMPLANT
GLOVE BIOGEL PI IND STRL 8.5 (GLOVE) ×2 IMPLANT
GOWN SPEC L3 XXLG W/TWL (GOWN DISPOSABLE) ×2 IMPLANT
GOWN STRL REUS W/ TWL XL LVL3 (GOWN DISPOSABLE) ×2 IMPLANT
GOWN STRL REUS W/TWL XL LVL3 (GOWN DISPOSABLE) ×1
HANDPIECE INTERPULSE COAX TIP (DISPOSABLE) ×1
HOLDER FOLEY CATH W/STRAP (MISCELLANEOUS) IMPLANT
HOOD PEEL AWAY T7 (MISCELLANEOUS) ×8 IMPLANT
IMMOBILIZER KNEE 20 (SOFTGOODS) ×1
IMMOBILIZER KNEE 20 THIGH 36 (SOFTGOODS) IMPLANT
IMPL TAPESTRY BIOINTEGR 30X30 (Orthopedic Implant) IMPLANT
IMPL TAPESTRY BIOINTEGR 40X30 (Mesh General) IMPLANT
INSERT TIB PS CD/6-9 14 LT (Insert) IMPLANT
INSERT TIB PS CMT KEEL SZ DL (Insert) IMPLANT
INSTR SCRW HEX REV FIX 3.5X48 (ORTHOPEDIC DISPOSABLE SUPPLIES) ×1
INSTRUMENT SCRW HEX REV 3.5X48 (ORTHOPEDIC DISPOSABLE SUPPLIES) IMPLANT
KIT DRSG PREVENA PLUS 7DAY 125 (MISCELLANEOUS) IMPLANT
KIT TURNOVER KIT A (KITS) IMPLANT
MANIFOLD NEPTUNE II (INSTRUMENTS) ×2 IMPLANT
MARKER SKIN DUAL TIP RULER LAB (MISCELLANEOUS) ×2 IMPLANT
NDL SPNL 18GX3.5 QUINCKE PK (NEEDLE) ×2 IMPLANT
NEEDLE SPNL 18GX3.5 QUINCKE PK (NEEDLE) ×1
NS IRRIG 1000ML POUR BTL (IV SOLUTION) ×2 IMPLANT
PACK TOTAL KNEE CUSTOM (KITS) ×2 IMPLANT
PADDING CAST COTTON 6X4 STRL (CAST SUPPLIES) IMPLANT
POST FEM CENTRAL SZ SM LT/RT (Joint) IMPLANT
PROTECTOR NERVE ULNAR (MISCELLANEOUS) ×2 IMPLANT
RESTRICTOR CEMENT SZ 5 C-STEM (Cement) IMPLANT
SAW OSC TIP CART 19.5X105X1.3 (SAW) ×2 IMPLANT
SCREW HEX HEADED 3.5X27 DISP (ORTHOPEDIC DISPOSABLE SUPPLIES) IMPLANT
SEALER BIPOLAR AQUA 6.0 (INSTRUMENTS) ×2 IMPLANT
SET HNDPC FAN SPRY TIP SCT (DISPOSABLE) ×2 IMPLANT
SET PAD KNEE POSITIONER (MISCELLANEOUS) ×2 IMPLANT
SOLUTION PRONTOSAN WOUND 350ML (IRRIGATION / IRRIGATOR) IMPLANT
SPIKE FLUID TRANSFER (MISCELLANEOUS) IMPLANT
SPONGE DRAIN TRACH 4X4 STRL 2S (GAUZE/BANDAGES/DRESSINGS) ×2 IMPLANT
STEM FEM CMT CCR PLS SZ 7 L (Stem) IMPLANT
STEM FEM OFF KNEE 135X15X3 (Stem) IMPLANT
STEM STRT SPLINE EXT PS 18X135 (Stem) IMPLANT
SUT ETHILON 2 0 PS N (SUTURE) IMPLANT
SUT MNCRL AB 3-0 PS2 18 (SUTURE) ×2 IMPLANT
SUT MON AB 2-0 CT1 36 (SUTURE) ×4 IMPLANT
SUT STRATAFIX PDO 1 14 VIOLET (SUTURE) ×1
SUT STRATFX PDO 1 14 VIOLET (SUTURE) ×1
SUT VIC AB 1 CTX 36 (SUTURE) ×3
SUT VIC AB 1 CTX36XBRD ANBCTR (SUTURE) ×4 IMPLANT
SUT VIC AB 2-0 CT1 27 (SUTURE) ×1
SUT VIC AB 2-0 CT1 TAPERPNT 27 (SUTURE) ×2 IMPLANT
SUTURE STRATFX PDO 1 14 VIOLET (SUTURE) ×2 IMPLANT
TOWER CARTRIDGE SMART MIX (DISPOSABLE) ×4 IMPLANT
TRAY FOLEY MTR SLVR 16FR STAT (SET/KITS/TRAYS/PACK) ×2 IMPLANT
TUBE SUCTION HIGH CAP CLEAR NV (SUCTIONS) ×2 IMPLANT
WATER STERILE IRR 1000ML POUR (IV SOLUTION) ×2 IMPLANT
WEDGE ARTHRO KNEE MED (Knees) IMPLANT
WRAP KNEE MAXI GEL POST OP (GAUZE/BANDAGES/DRESSINGS) ×2 IMPLANT

## 2023-08-25 NOTE — Interval H&P Note (Signed)
History and Physical Interval Note:  08/25/2023 10:38 AM  Norma Anthony  has presented today for surgery, with the diagnosis of S/P resection left total knee arthroplasty.  The various methods of treatment have been discussed with the patient and family. After consideration of risks, benefits and other options for treatment, the patient has consented to  Procedure(s) with comments: Knee spacer removal (Left) - 350 TOTAL KNEE REVISION (Left) - 350 as a surgical intervention.  The patient's history has been reviewed, patient examined, no change in status, stable for surgery.  I have reviewed the patient's chart and labs.  Questions were answered to the patient's satisfaction.     Iline Oven Leanndra Pember

## 2023-08-25 NOTE — Discharge Instructions (Addendum)
Dr. Samson Frederic Total Joint Specialist [facility] [address], [address] [place], [place] [address] [phone]  TOTAL KNEE REPLACEMENT POSTOPERATIVE DIRECTIONS    Knee Rehabilitation, Guidelines Following Surgery  Results after knee surgery are often greatly improved when you follow the exercise, range of motion and muscle strengthening exercises prescribed by your doctor. Safety measures are also important to protect the knee from further injury. Any time any of these exercises cause you to have increased pain or swelling in your knee joint, decrease the amount until you are comfortable again and slowly increase them. If you have problems or questions, call your caregiver or physical therapist for advice.   WEIGHT BEARING Weight bearing as tolerated with assist device (walker, cane, etc) as directed, use it as long as suggested by your surgeon or therapist, typically at least 4-6 weeks.  HOME CARE INSTRUCTIONS  Remove items at home which could result in a fall. This includes throw rugs or furniture in walking pathways.  Continue medications as instructed at time of discharge. You may have some home medications which will be placed on hold until you complete the course of blood thinner medication.  You may start showering once you are discharged home but do not submerge the incision under water. Just pat the incision dry and apply a dry gauze dressing on daily. Walk with walker as instructed.  You may resume a sexual relationship in one month or when given the OK by your doctor.  Use walker as long as suggested by your caregivers. Avoid periods of inactivity such as sitting longer than an hour when not asleep. This helps prevent blood clots.  You may put full weight on your legs and walk as much as is comfortable.  You may return to work once you are cleared by your doctor.  Do not drive a car for 6 weeks or until released by you surgeon.  Do not drive while  taking narcotics.  Wear the elastic stockings for three weeks following surgery during the day but you may remove then at night. Make sure you keep all of your appointments after your operation with all of your doctors and caregivers. You should call the office at the above phone number and make an appointment for approximately two weeks after the date of your surgery. Do not remove your surgical dressing. The dressing is waterproof; you may take showers in 3 days, but do not take tub baths or submerge the dressing. Please pick up a stool softener and laxative for home use as long as you are requiring pain medications. ICE to the affected knee every three hours for 30 minutes at a time and then as needed for pain and swelling.  Continue to use ice on the knee for pain and swelling from surgery. You may notice swelling that will progress down to the foot and ankle.  This is normal after surgery.  Elevate the leg when you are not up walking on it.   It is important for you to complete the blood thinner medication as prescribed by your doctor. Continue to use the breathing machine which will help keep your temperature down.  It is common for your temperature to cycle up and down following surgery, especially at night when you are not up moving around and exerting yourself.  The breathing machine keeps your lungs expanded and your temperature down.  RANGE OF MOTION AND STRENGTHENING EXERCISES  Rehabilitation of the knee is important following a knee injury or an  operation. After just a few days of immobilization, the muscles of the thigh which control the knee become weakened and shrink (atrophy). Knee exercises are designed to build up the tone and strength of the thigh muscles and to improve knee motion. Often times heat used for twenty to thirty minutes before working out will loosen up your tissues and help with improving the range of motion but do not use heat for the first two weeks following surgery.  These exercises can be done on a training (exercise) mat, on the floor, on a table or on a bed. Use what ever works the best and is most comfortable for you Knee exercises include:  Leg Lifts - While your knee is still immobilized in a splint or cast, you can do straight leg raises. Lift the leg to 60 degrees, hold for 3 sec, and slowly lower the leg. Repeat 10-20 times 2-3 times daily. Perform this exercise against resistance later as your knee gets better.  Quad and Hamstring Sets - Tighten up the muscle on the front of the thigh (Quad) and hold for 5-10 sec. Repeat this 10-20 times hourly. Hamstring sets are done by pushing the foot backward against an object and holding for 5-10 sec. Repeat as with quad sets.  A rehabilitation program following serious knee injuries can speed recovery and prevent re-injury in the future due to weakened muscles. Contact your doctor or a physical therapist for more information on knee rehabilitation.   POST-OPERATIVE OPIOID TAPER INSTRUCTIONS: It is important to wean off of your opioid medication as soon as possible. If you do not need pain medication after your surgery it is ok to stop day one. Opioids include: Codeine, Hydrocodone(Norco, Vicodin), Oxycodone(Percocet, oxycontin) and hydromorphone amongst others.  Long term and even short term use of opiods can cause: Increased pain response Dependence Constipation Depression Respiratory depression And more.  Withdrawal symptoms can include Flu like symptoms Nausea, vomiting And more Techniques to manage these symptoms Hydrate well Eat regular healthy meals Stay active Use relaxation techniques(deep breathing, meditating, yoga) Do Not substitute Alcohol to help with tapering If you have been on opioids for less than two weeks and do not have pain than it is ok to stop all together.  Plan to wean off of opioids This plan should start within one week post op of your joint replacement. Maintain the same  interval or time between taking each dose and first decrease the dose.  Cut the total daily intake of opioids by one tablet each day Next start to increase the time between doses. The last dose that should be eliminated is the evening dose.    SKILLED REHAB INSTRUCTIONS: If the patient is transferred to a skilled rehab facility following release from the hospital, a list of the current medications will be sent to the facility for the patient to continue.  When discharged from the skilled rehab facility, please have the facility set up the patient's Home Health Physical Therapy prior to being released. Also, the skilled facility will be responsible for providing the patient with their medications at time of release from the facility to include their pain medication, the muscle relaxants, and their blood thinner medication. If the patient is still at the rehab facility at time of the two week follow up appointment, the skilled rehab facility will also need to assist the patient in arranging follow up appointment in our office and any transportation needs.  MAKE SURE YOU:  Understand these instructions.  Will watch  your condition.  Will get help right away if you are not doing well or get worse.    Pick up stool softner and laxative for home use following surgery while on pain medications. Do NOT remove your dressing.  Do not take tub baths or submerge incision under water. Please use a clean towel to pat the incision.  Continue to use ice for pain and swelling after surgery. Do not use any lotions or creams on the incision until instructed by your surgeon. Keep your dressing clean and dry.  Please wear your Knee immobilizer, do not bend your knee.  Please charge Prevena incisional negative pressure dressing nightly.  Follow-up within 7 days of discharge for removal of negative pressure dressing.

## 2023-08-25 NOTE — Anesthesia Procedure Notes (Signed)
Anesthesia Regional Block: Adductor canal block   Pre-Anesthetic Checklist: , timeout performed,  Correct Patient, Correct Site, Correct Laterality,  Correct Procedure, Correct Position, site marked,  Risks and benefits discussed,  Surgical consent,  Pre-op evaluation,  At surgeon's request and post-op pain management  Laterality: Left  Prep: chloraprep       Needles:  Injection technique: Single-shot  Needle Type: Echogenic Stimulator Needle     Needle Length: 9cm  Needle Gauge: 21     Additional Needles:   Procedures:,,,, ultrasound used (permanent image in chart),,    Narrative:  Start time: 08/25/2023 11:50 AM End time: 08/25/2023 11:55 AM Injection made incrementally with aspirations every 5 mL.  Performed by: Personally  Anesthesiologist: Shelton Silvas, MD  Additional Notes: Discussed risks and benefits of the nerve block in detail, including but not limited vascular injury, permanent nerve damage and infection.   Patient tolerated the procedure well. Local anesthetic introduced in an incremental fashion under minimal resistance after negative aspirations. No paresthesias were elicited. After completion of the procedure, no acute issues were identified and patient continued to be monitored by RN.

## 2023-08-25 NOTE — Transfer of Care (Signed)
Immediate Anesthesia Transfer of Care Note  Patient: Norma Anthony  Procedure(s) Performed: Knee spacer removal (Left: Knee) TOTAL KNEE REVISION (Left: Knee)  Patient Location: PACU  Anesthesia Type:General and GA combined with regional for post-op pain  Level of Consciousness: drowsy  Airway & Oxygen Therapy: Patient Spontanous Breathing and Patient connected to face mask oxygen  Post-op Assessment: Report given to RN and Post -op Vital signs reviewed and stable  Post vital signs: Reviewed and stable  Last Vitals:  Vitals Value Taken Time  BP 150/77 08/25/23 1711  Temp    Pulse 87 08/25/23 1714  Resp 12 08/25/23 1714  SpO2 95 % 08/25/23 1714  Vitals shown include unfiled device data.  Last Pain:  Vitals:   08/25/23 1155  TempSrc:   PainSc: 0-No pain         Complications: No notable events documented.

## 2023-08-25 NOTE — Anesthesia Procedure Notes (Signed)
Procedure Name: Intubation Date/Time: 08/25/2023 12:16 PM  Performed by: Shary Decamp, CRNAPre-anesthesia Checklist: Patient identified, Patient being monitored, Timeout performed, Emergency Drugs available and Suction available Patient Re-evaluated:Patient Re-evaluated prior to induction Oxygen Delivery Method: Circle System Utilized Preoxygenation: Pre-oxygenation with 100% oxygen Induction Type: IV induction Ventilation: Mask ventilation without difficulty Laryngoscope Size: Miller and 2 Grade View: Grade I Tube type: Oral Tube size: 7.0 mm Number of attempts: 1 Airway Equipment and Method: Stylet Placement Confirmation: ETT inserted through vocal cords under direct vision, positive ETCO2 and breath sounds checked- equal and bilateral Secured at: 21 cm Tube secured with: Tape Dental Injury: Teeth and Oropharynx as per pre-operative assessment

## 2023-08-25 NOTE — Anesthesia Preprocedure Evaluation (Addendum)
Anesthesia Evaluation  Patient identified by MRN, date of birth, ID band Patient awake    Reviewed: Allergy & Precautions, NPO status , Patient's Chart, lab work & pertinent test results  Airway Mallampati: I  TM Distance: >3 FB Neck ROM: Full    Dental  (+) Edentulous Upper, Edentulous Lower   Pulmonary sleep apnea , COPD, former smoker    + decreased breath sounds      Cardiovascular + CAD   Rhythm:Regular Rate:Normal     Neuro/Psych  Headaches PSYCHIATRIC DISORDERS Anxiety Depression     Neuromuscular disease    GI/Hepatic Neg liver ROS, hiatal hernia,GERD  ,,  Endo/Other  negative endocrine ROS    Renal/GU Renal disease     Musculoskeletal  (+) Arthritis ,  Fibromyalgia -  Abdominal   Peds  Hematology  (+) Blood dyscrasia, anemia   Anesthesia Other Findings   Reproductive/Obstetrics                             Anesthesia Physical Anesthesia Plan  ASA: 3  Anesthesia Plan: Spinal   Post-op Pain Management: Tylenol PO (pre-op)* and Regional block*   Induction: Intravenous  PONV Risk Score and Plan: 2 and Ondansetron and Propofol infusion  Airway Management Planned: Natural Airway and Nasal Cannula  Additional Equipment: None  Intra-op Plan:   Post-operative Plan:   Informed Consent: I have reviewed the patients History and Physical, chart, labs and discussed the procedure including the risks, benefits and alternatives for the proposed anesthesia with the patient or authorized representative who has indicated his/her understanding and acceptance.       Plan Discussed with: CRNA  Anesthesia Plan Comments: (Lab Results      Component                Value               Date                      WBC                      8.1                 08/04/2023                HGB                      13.6                08/09/2023                HCT                      40.0                 08/09/2023                MCV                      98.0                08/04/2023                PLT                      ACLMP  08/04/2023           )        Anesthesia Quick Evaluation

## 2023-08-25 NOTE — Progress Notes (Signed)
Morrie Sheldon from office called patient yesterday afternoon and told patient that her surgery for today has been approved by insurance.  7:10 AM OR Desk made aware that surgery has been approved   7:15 AM Notified Dr Linna Caprice that surgery has been approved .

## 2023-08-25 NOTE — Anesthesia Postprocedure Evaluation (Signed)
Anesthesia Post Note  Patient: Norma Anthony  Procedure(s) Performed: Knee spacer removal (Left: Knee) TOTAL KNEE REVISION (Left: Knee)     Patient location during evaluation: PACU Anesthesia Type: Spinal Level of consciousness: awake and alert Pain management: pain level controlled Vital Signs Assessment: post-procedure vital signs reviewed and stable Respiratory status: spontaneous breathing, nonlabored ventilation, respiratory function stable and patient connected to nasal cannula oxygen Cardiovascular status: blood pressure returned to baseline and stable Postop Assessment: no apparent nausea or vomiting Anesthetic complications: no   No notable events documented.  Last Vitals:  Vitals:   08/25/23 1155 08/25/23 1715  BP: 127/73 (!) 148/83  Pulse: 70 87  Resp: 20 12  Temp:  36.4 C  SpO2: 98% 95%    Last Pain:  Vitals:   08/25/23 1715  TempSrc:   PainSc: Asleep                 [name]

## 2023-08-25 NOTE — Op Note (Signed)
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   POSTOPERATIVE DIAGNOSIS:  Same.   PROCEDURE:  1.  Resection of left knee static spacer. 2.  Reimplantation left total knee arthroplasty. 3. Augmentation of arthrotomy repair (CPT E3822220). 4. Placement of negative pressure incisional dressing.   ANESTHESIA:   Regional.   Spinal. MAC.   ANTIBIOTICS:  2g Ancef. 1g Vancomycin.   EXPLANTS: Static spacer.   IMPLANTS: Zimmer persona revision cemented femur size 7+ with 5 mm distal augment x2, and 18 x 135 mm splined stem. Zimmer persona revision trabecular metal femoral central cone, size medium.  Zimmer persona revision tibia fixed bearing size D with 5 mm medial and tibial augments, and 15 x 135 mm splined stem, 3 mm offset. Zimmer persona revision trabecular metal tibial central cone, size medium. Zimmer persona fixed-bearing Vivacit-E CPS poly liner, size 14 mm. Embody Tapestry BioIntegrative implant 40 x 30 mm. Embody Tapestry BioIntegrative implant 30 x 50 mm, x 2. Zimmer 1.8 mm adult reconstruction cable.   SPECIMENS:  Left knee femoral interface membrane for tissue culture.   COMPLICATIONS:  Nondisplaced distal femur metaphyseal fracture at base of cone during final insertion.   DISPOSITION:  Stable to PACU.   SURGICAL INDICATIONS:  Norma Anthony is a 67 y.o. female who underwent resection left total knee arthroplasty with placement of static antibiotic spacer for MSSA PJI on 03/10/2023.  She completed the treatment course of antibiotics, and after an appropriate antibiotic holiday, repeat testing was done.  On 06/21/2023, sed rate was normal at 28 mm/h, and C-reactive protein normal at 4.2 mg/L.  Aspiration of the left knee was done on 06/21/2023, yielding  negative alpha defensin.  Microbial ID panel negative.  She was indicated for resection of the spacer and revision left total knee arthroplasty.   The risks, benefits, and alternatives were discussed with the patient. There are risks associated with the surgery including, but not limited to, problems with anesthesia (death), infection, instability (giving out of the joint), dislocation, differences in leg length/angulation/rotation, fracture of bones, loosening or failure of implants, hematoma (blood accumulation) which may require surgical drainage, blood clots, pulmonary embolism, nerve injury (foot drop and lateral thigh numbness), and blood vessel injury. The patient understands these risks and elects to proceed.   PROCEDURE IN DETAIL: The patient was identified in the holding area using 2 identifiers.  Regional block was placed by anesthesia. She was taken to the operating room, placed supine on the operating room table.  Spinal anesthesia was obtained.  A Foley catheter was placed.  Nonsterile tourniquet was applied to the right thigh.  The right lower extremity was prepped and draped in the normal sterile surgical fashion.  Timeout was called, verifying side and site of surgery.  She did receive IV antibiotics within 60 minutes of beginning the procedure.  Gravity exsanguination was utilized, and the tourniquet was elevated to 340 mmHg.  The previous anterior incision was sharply excised with a #10 blade. Full-thickness skin flaps were created.  Medial peripatellar arthrotomy was performed.  No purulence or evidence of infection.  Medial release was performed.  Medial and lateral gutters were reestablished.  I dissected the quadriceps muscle off of the anterior femur, where it was scarred into place. The knee was brought into  extension.  Infrapatellar scar was excised with Bovie electrocautery.  The spacer was removed with osteotomes and a metal cutting bur. The intramedullary dowels were removed.  Femoral interface membrane were sent for tissue culture.  Posterior compartment capsulectomy was performed.  Sequential reaming was performed up to a size 15 mm reamer on the tibia, and a 28 mm reamer on the femur.  Intramedullary guide was used to freshen the proximal tibial cut.  There was deficient central cancellous bone. The tibia was sized to a D, and 3 mm of offset was used to optimize coverage.  The proximal tibia was then prepped for a cone.  The trial tibial component was assembled on the back table and inserted.  I then turned my attention to the femur.  The femur was sized to a 7+.  The distal cutting guide was used to freshen the distal femoral cut. The 4-in-1 block was pinned in the place, while using a laminar spreader to appropriately tension the flexion gap.  The posterior condyles were intact.  I planned for distal augments x2. I prepped for a medium cone. The box cut was then made.  The trial femoral component was assembled and inserted.  I placed a trial liner.   The knee was brought into extension.  Scar tissue was removed from the patellar remnant with a curette. I elected to augment the proximal tibia both medially and laterally to reduce poly thickness. The knee was stable to varus and valgus stress throughout the range of motion.  The flexion and extension gaps were well-balanced. The patella tracked centrally using no thumbs technique. The knee was then irrigated with normal saline using pulse lavage and Prontosan solution.  The real components were opened and assembled on the back table.  Small drill holes were made in the proximal tibia.  The cut bony surfaces were irrigated and dried.  The tibial cone was placed.  The femoral cone was placed. A small nondisplaced fracture was seen in the anterior femoral cortex. The tibial and femoral components were then inserted using a hybrid fixation technique.  Trial polyliner was placed, and excess cement was cleared.  The knee was brought  into extension while the cement polymerized.  All excess cement was removed.  The real 14 mm CPS liner was inserted.  I placed a single 1.8 mm subperiosteal adult reconstruction cable over the distal femur metaphysis.  The tourniquet was let down. Meticulous hemostasis was achieved with the aqua mantis and Bovie electrocautery.  The knee was irrigated again with Prontosan solution and normal saline using pulse lavage.  A medium Hemovac drain was placed within the lateral gutter.  The arthrotomy was closed with #1 Vicryl and #1 strata fix suture. I augmented the fascial repair with Tapestry BioIntegrative implants. Deep dermal layer was closed with 2-0 Monocryl. Skin was reapproximated with staples.  Customizable Prevena dressing was placed and connected to suction at 75 mmHg without leak.  Bulky dressing was then placed with cast padding and an Ace wrap. A knee immobilizer was placed. Patient was awakened from anesthesia and taken to the PACU in stable condition.  Sponge, needle, and instrument counts were correct at the end of the case x2.  There were no known complications.   The aquamantis was utilized for this case to help facilitate better hemostasis as patient was felt to be at increased risk of bleeding because of complex case requiring increased OR time and/or exposure.   Please note that a surgical assistant was a  medical necessity for this procedure in order to perform it in a safe and expeditious manner. Surgical assistant was necessary to retract the ligaments and vital neurovascular structures to prevent injury to them and also necessary for proper positioning of the limb to allow for anatomic placement of the prosthesis.

## 2023-08-26 ENCOUNTER — Other Ambulatory Visit: Payer: Self-pay

## 2023-08-26 LAB — BASIC METABOLIC PANEL
Anion gap: 8 (ref 5–15)
BUN: 11 mg/dL (ref 8–23)
CO2: 26 mmol/L (ref 22–32)
Calcium: 7.9 mg/dL — ABNORMAL LOW (ref 8.9–10.3)
Chloride: 103 mmol/L (ref 98–111)
Creatinine, Ser: 0.8 mg/dL (ref 0.44–1.00)
GFR, Estimated: >60 mL/min (ref >60)
Glucose, Bld: 138 mg/dL — ABNORMAL HIGH (ref 70–99)
Potassium: 4.9 mmol/L (ref 3.5–5.1)
Sodium: 137 mmol/L (ref 135–145)

## 2023-08-26 LAB — CBC
HCT: 34 % — ABNORMAL LOW (ref 36.0–46.0)
Hemoglobin: 10.3 g/dL — ABNORMAL LOW (ref 12.0–15.0)
MCH: 30.8 pg (ref 26.0–34.0)
MCHC: 30.3 g/dL (ref 30.0–36.0)
MCV: 101.8 fL — ABNORMAL HIGH (ref 80.0–100.0)
Platelets: 183 10*3/uL (ref 150–400)
RBC: 3.34 MIL/uL — ABNORMAL LOW (ref 3.87–5.11)
RDW: 14.1 % (ref 11.5–15.5)
WBC: 15.3 10*3/uL — ABNORMAL HIGH (ref 4.0–10.5)
nRBC: 0 % (ref 0.0–0.2)

## 2023-08-26 MED ORDER — MUPIROCIN 2 % EX OINT
TOPICAL_OINTMENT | Freq: Two times a day (BID) | CUTANEOUS | Status: DC
  Administered 2023-08-26 – 2023-08-28 (×3): 1 via NASAL
  Filled 2023-08-26: qty 22

## 2023-08-26 NOTE — Progress Notes (Signed)
Subjective:  Patient reports pain as mild to moderate.  Denies N/V/CP/SOB/Abd pain. She denies any tingling or numbness in LE bilaterally. She reports some soreness with movement today, to be expected.   We discussed to limit IV narcotic medications as they are dropping her O2 STATs. We discussed in depth about exercises for her to do in bed including quad set and ankle pumps, and upper extremity strengthening.   Objective:   VITALS:   Vitals:   08/26/23 0542 08/26/23 0548 08/26/23 0610 08/26/23 0615  BP:  113/70    Pulse:  (!) 106    Resp:  20    Temp:  98.7 F (37.1 C)    TempSrc:  Oral    SpO2: 95% 95% (!) 83% 95%  Weight:      Height:        Patient lying in bed. NAD.  Neurologically intact ABD soft Neurovascular intact Sensation intact distally Intact pulses distally Dorsiflexion/Plantar flexion intact No cellulitis present Compartment soft Prevena incisional dressing C/D/I, 75 mmHg no leaks detected.  JP drain 85 cc SS fluid. Hemovac drain 320 cc bloody drainage.   Lab Results  Component Value Date   WBC 15.3 (H) 08/26/2023   HGB 10.3 (L) 08/26/2023   HCT 34.0 (L) 08/26/2023   MCV 101.8 (H) 08/26/2023   PLT 183 08/26/2023   BMET    Component Value Date/Time   NA 137 08/26/2023 0337   K 4.9 08/26/2023 0337   CL 103 08/26/2023 0337   CO2 26 08/26/2023 0337   GLUCOSE 138 (H) 08/26/2023 0337   BUN 11 08/26/2023 0337   CREATININE 0.80 08/26/2023 0337   CALCIUM 7.9 (L) 08/26/2023 0337   EGFR 101.0 04/13/2023 1511   GFRNONAA >60 08/26/2023 8295   Results for orders placed or performed during the hospital encounter of 08/25/23  Aerobic/Anaerobic Culture w Gram Stain (surgical/deep wound)     Status: None (Preliminary result)   Collection Time: 08/25/23  1:11 PM   Specimen: Path Tissue  Result Value Ref Range Status   Specimen Description   Final    TISSUE Performed at [facility], [address], [place], [place] [address]     Special Requests LEFT KNEE  Final   Gram Stain   Final    RARE WBC PRESENT, PREDOMINANTLY MONONUCLEAR NO ORGANISMS SEEN    Culture   Final    NO GROWTH < 12 HOURS Performed at [facility], [address] [place], [place] [address]    Report Status PENDING  Incomplete     Assessment/Plan: 1 Day Post-Op   Principal Problem:   Infected prosthetic knee joint (HCC) Active Problems:   Infection of total right knee replacement, subsequent encounter  ABLA. Hemoglobin 10.3. Continue to monitor.   WBAT with Knee immobilizer and walker. Do not bend the knee.  DVT ppx: Xarelto, SCDs, TEDS PO pain control: Limit IV narcotics due to desaturation.  PT/OT: PT has not seen yet. PT to continue today. Focus on quad strengthening and mobilizing with walker.  Dispo:  - Gram stain negative. Continue to monitor intraoperative cultures.  - Mupirocin ointment and nystatin powder daily.  - Continue JP drain until output <10cc.  - Continue hemovac drain until output <30cc.  - Continue Prevena incisional dressing on house vac . Upon discharge nurse to convert house vac to prevena portable vac.  - D/c pending drain output and PT clearance.    Nadeen Shipman S Alegra Rost,PA-C 08/26/2023, 10:00 AM  [facility]  [place] [address], [address], [place], [place] [address] Phone: [phone] [email or web address] Facebook  [employer]

## 2023-08-26 NOTE — Evaluation (Signed)
Physical Therapy Evaluation Patient Details Name: Norma Anthony MRN: [id number] DOB: [birth year] Today's Date: 08/26/2023  History of Present Illness  Pt s/p reimplantation of L TKR and with hx of DM, anxiety, L TKR, CAD, COPD, PE and Fibromyalgia  Clinical Impression  Pt admitted as above and presenting with functional mobility limitations 2* decreased L LE strength/ROM, post op pain, obesity, premorbid deconditioning and elevated anxiety level.  Pt hopes to progress to dc home with family assist.        If plan is discharge home, recommend the following: A lot of help with walking and/or transfers;A lot of help with bathing/dressing/bathroom;Assistance with cooking/housework;Assist for transportation;Help with stairs or ramp for entrance   Can travel by private vehicle        Equipment Recommendations None recommended by PT  Recommendations for Other Services       Functional Status Assessment Patient has had a recent decline in their functional status and demonstrates the ability to make significant improvements in function in a reasonable and predictable amount of time.     Precautions / Restrictions Precautions Precautions: Fall;Knee Required Braces or Orthoses: Knee Immobilizer - Left Knee Immobilizer - Left: Discontinue once straight leg raise with < 10 degree lag Restrictions Weight Bearing Restrictions: No LLE Weight Bearing: Weight bearing as tolerated      Mobility  Bed Mobility Overal bed mobility: Needs Assistance Bed Mobility: Supine to Sit     Supine to sit: Min assist, Mod assist, +2 for physical assistance, +2 for safety/equipment     General bed mobility comments: extensive use of bedrails, assist to manage L LE    Transfers Overall transfer level: Needs assistance Equipment used: Rolling walker (2 wheels) Transfers: Sit to/from Stand Sit to Stand: Min assist, Mod assist, +2 physical assistance, +2 safety/equipment, From elevated surface            General transfer comment: cues for LE management and use of UEs to self assist.  Physical assist to bring wt up and fwd and to balance in standing with RW    Ambulation/Gait Ambulation/Gait assistance: Min assist, Mod assist, +2 physical assistance, +2 safety/equipment Gait Distance (Feet): 2 Feet Assistive device: Rolling walker (2 wheels) Gait Pattern/deviations: Step-to pattern, Decreased step length - right, Decreased step length - left, Shuffle, Trunk flexed Gait velocity: decr     General Gait Details: cues for sequence, posture and position from RW.  Step pvt bed to recliner  Stairs            Wheelchair Mobility     Tilt Bed    Modified Rankin (Stroke Patients Only)       Balance Overall balance assessment: Needs assistance Sitting-balance support: No upper extremity supported, Feet supported Sitting balance-Leahy Scale: Good     Standing balance support: Bilateral upper extremity supported Standing balance-Leahy Scale: Poor                               Pertinent Vitals/Pain Pain Assessment Pain Assessment: 0-10 Pain Score: 5  Pain Location: L knee Pain Descriptors / Indicators: Aching, Grimacing, Sore Pain Intervention(s): Limited activity within patient's tolerance, Monitored during session, Premedicated before session, Ice applied    Home Living Family/patient expects to be discharged to:: Private residence Living Arrangements: Spouse/significant other Available Help at Discharge: Family;Available 24 hours/day Type of Home: House Home Access: Stairs to enter Entrance Stairs-Rails: None Entrance Stairs-Number of Steps: 2   Home Layout:  One level Home Equipment: Agricultural consultant (2 wheels);Cane - single point;Wheelchair Financial trader (4 wheels);BSC/3in1      Prior Function Prior Level of Function : History of Falls (last six months);Needs assist             Mobility Comments: Pt largely bedbound since spacer placement  3/24 ADLs Comments: Assist for all ADL     Extremity/Trunk Assessment   Upper Extremity Assessment Upper Extremity Assessment: Overall WFL for tasks assessed    Lower Extremity Assessment Lower Extremity Assessment: LLE deficits/detail LLE Deficits / Details: 2+/5 quads with AAROM at knee -5 - 20    Cervical / Trunk Assessment Cervical / Trunk Assessment: Normal  Communication   Communication Communication: No apparent difficulties Cueing Techniques: Verbal cues;Gestural cues;Tactile cues;Visual cues  Cognition Arousal: Alert Behavior During Therapy: Anxious Overall Cognitive Status: Within Functional Limits for tasks assessed                                          General Comments      Exercises Total Joint Exercises Ankle Circles/Pumps: AROM, Both, 15 reps, Supine Quad Sets: AROM, Both, 10 reps, Supine Heel Slides: AAROM, Left, 10 reps, Supine Straight Leg Raises: AAROM, Left, 10 reps, Supine   Assessment/Plan    PT Assessment Patient needs continued PT services  PT Problem List Decreased strength;Decreased range of motion;Decreased activity tolerance;Decreased balance;Decreased mobility;Decreased knowledge of use of DME;Obesity;Pain       PT Treatment Interventions DME instruction;Gait training;Stair training;Functional mobility training;Therapeutic activities;Therapeutic exercise;Balance training;Patient/family education    PT Goals (Current goals can be found in the Care Plan section)  Acute Rehab PT Goals Patient Stated Goal: Regain IND and be able to access bedroom on second level PT Goal Formulation: With patient Time For Goal Achievement: 09/09/23 Potential to Achieve Goals: Fair    Frequency 7X/week     Co-evaluation               AM-PAC PT "6 Clicks" Mobility  Outcome Measure Help needed turning from your back to your side while in a flat bed without using bedrails?: A Lot Help needed moving from lying on your back to  sitting on the side of a flat bed without using bedrails?: A Lot Help needed moving to and from a bed to a chair (including a wheelchair)?: A Lot Help needed standing up from a chair using your arms (e.g., wheelchair or bedside chair)?: A Lot Help needed to walk in hospital room?: Total Help needed climbing 3-5 steps with a railing? : Total 6 Click Score: 10    End of Session Equipment Utilized During Treatment: Gait belt;Left knee immobilizer Activity Tolerance: Patient tolerated treatment well Patient left: in chair;with call bell/phone within reach;with chair alarm set;with family/visitor present Nurse Communication: Mobility status PT Visit Diagnosis: Unsteadiness on feet (R26.81);Muscle weakness (generalized) (M62.81);Difficulty in walking, not elsewhere classified (R26.2);History of falling (Z91.81);Pain Pain - Right/Left: Left Pain - part of body: Knee    Time: 1012-1048 PT Time Calculation (min) (ACUTE ONLY): 36 min   Charges:   PT Evaluation $PT Eval Low Complexity: 1 Low PT Treatments $Therapeutic Exercise: 8-22 mins PT General Charges $$ ACUTE PT VISIT: 1 Visit         Mauro Kaufmann PT Acute Rehabilitation Services Pager [phone] Office [phone]   Norma Anthony 08/26/2023, 2:05 PM

## 2023-08-26 NOTE — Progress Notes (Signed)
Physical Therapy Treatment Patient Details Name: Norma Anthony MRN: [id number] DOB: 01-02-1956 Today's Date: 08/26/2023   History of Present Illness Pt s/p reimplantation of L TKR and with hx of DM, anxiety, L TKR, CAD, COPD, PE and Fibromyalgia    PT Comments  Pt requesting back to bed and assisted up from chair to step/pivot with RW and step back to bedside.  Further attempt to ambulate deferred 2* pt c/o continued L foot numbness and increasing dizziness - BP sitting EOB 94/30 - RN aware.    If plan is discharge home, recommend the following: A lot of help with walking and/or transfers;A lot of help with bathing/dressing/bathroom;Assistance with cooking/housework;Assist for transportation;Help with stairs or ramp for entrance   Can travel by private vehicle        Equipment Recommendations  None recommended by PT    Recommendations for Other Services       Precautions / Restrictions Precautions Precautions: Fall;Knee Required Braces or Orthoses: Knee Immobilizer - Left Knee Immobilizer - Left: Discontinue once straight leg raise with < 10 degree lag Restrictions Weight Bearing Restrictions: No LLE Weight Bearing: Weight bearing as tolerated     Mobility  Bed Mobility Overal bed mobility: Needs Assistance Bed Mobility: Sit to Supine     Supine to sit: Min assist, Mod assist, +2 for physical assistance, +2 for safety/equipment Sit to supine: Mod assist   General bed mobility comments: extensive use of bedrails, assist to manage L LE    Transfers Overall transfer level: Needs assistance Equipment used: Rolling walker (2 wheels) Transfers: Sit to/from Stand Sit to Stand: Min assist, Mod assist, +2 physical assistance, +2 safety/equipment, From elevated surface           General transfer comment: cues for LE management and use of UEs to self assist.  Physical assist to bring wt up and fwd and to balance in standing with RW    Ambulation/Gait Ambulation/Gait  assistance: Min assist, Mod assist, +2 physical assistance, +2 safety/equipment Gait Distance (Feet): 2 Feet Assistive device: Rolling walker (2 wheels) Gait Pattern/deviations: Step-to pattern, Decreased step length - right, Decreased step length - left, Shuffle, Trunk flexed Gait velocity: decr     General Gait Details: cues for sequence, posture and position from RW.  Step pvt bed to recliner   Stairs             Wheelchair Mobility     Tilt Bed    Modified Rankin (Stroke Patients Only)       Balance Overall balance assessment: Needs assistance Sitting-balance support: No upper extremity supported, Feet supported Sitting balance-Leahy Scale: Good     Standing balance support: Bilateral upper extremity supported Standing balance-Leahy Scale: Poor                              Cognition Arousal: Alert Behavior During Therapy: Anxious Overall Cognitive Status: Within Functional Limits for tasks assessed                                          Exercises Total Joint Exercises Ankle Circles/Pumps: AROM, Both, 15 reps, Supine Quad Sets: AROM, Both, 10 reps, Supine Heel Slides: AAROM, Left, 10 reps, Supine Straight Leg Raises: AAROM, Left, 10 reps, Supine    General Comments        Pertinent Vitals/Pain Pain Assessment  Pain Assessment: 0-10 Pain Score: 5  Pain Location: L knee Pain Descriptors / Indicators: Aching, Grimacing, Sore Pain Intervention(s): Limited activity within patient's tolerance, Monitored during session, Premedicated before session, Ice applied    Home Living Family/patient expects to be discharged to:: Private residence Living Arrangements: Spouse/significant other Available Help at Discharge: Family;Available 24 hours/day Type of Home: House Home Access: Stairs to enter Entrance Stairs-Rails: None Entrance Stairs-Number of Steps: 2   Home Layout: One level Home Equipment: Agricultural consultant (2  wheels);Cane - single point;Wheelchair Financial trader (4 wheels);BSC/3in1      Prior Function            PT Goals (current goals can now be found in the care plan section) Acute Rehab PT Goals Patient Stated Goal: Regain IND and be able to access bedroom on second level PT Goal Formulation: With patient Time For Goal Achievement: 09/09/23 Potential to Achieve Goals: Fair Progress towards PT goals: Progressing toward goals    Frequency    7X/week      PT Plan      Co-evaluation              AM-PAC PT "6 Clicks" Mobility   Outcome Measure  Help needed turning from your back to your side while in a flat bed without using bedrails?: A Lot Help needed moving from lying on your back to sitting on the side of a flat bed without using bedrails?: A Lot Help needed moving to and from a bed to a chair (including a wheelchair)?: A Lot Help needed standing up from a chair using your arms (e.g., wheelchair or bedside chair)?: A Lot Help needed to walk in hospital room?: Total Help needed climbing 3-5 steps with a railing? : Total 6 Click Score: 10    End of Session Equipment Utilized During Treatment: Gait belt;Left knee immobilizer Activity Tolerance: Patient tolerated treatment well Patient left: in chair;with call bell/phone within reach;with chair alarm set;with family/visitor present Nurse Communication: Mobility status PT Visit Diagnosis: Unsteadiness on feet (R26.81);Muscle weakness (generalized) (M62.81);Difficulty in walking, not elsewhere classified (R26.2);History of falling (Z91.81);Pain Pain - Right/Left: Left Pain - part of body: Knee     Time: 1330-1355 PT Time Calculation (min) (ACUTE ONLY): 25 min  Charges:    $Gait Training: 8-22 mins $Therapeutic Exercise: 8-22 mins $Therapeutic Activity: 8-22 mins PT General Charges $$ ACUTE PT VISIT: 1 Visit                     Mauro Kaufmann PT Acute Rehabilitation Services Pager [phone] Office  [phone]   Norma Anthony 08/26/2023, 2:11 PM

## 2023-08-27 LAB — CBC
HCT: 28.2 % — ABNORMAL LOW (ref 36.0–46.0)
Hemoglobin: 8.9 g/dL — ABNORMAL LOW (ref 12.0–15.0)
MCH: 32.2 pg (ref 26.0–34.0)
MCHC: 31.6 g/dL (ref 30.0–36.0)
MCV: 102.2 fL — ABNORMAL HIGH (ref 80.0–100.0)
Platelets: 124 10*3/uL — ABNORMAL LOW (ref 150–400)
RBC: 2.76 MIL/uL — ABNORMAL LOW (ref 3.87–5.11)
RDW: 13.9 % (ref 11.5–15.5)
WBC: 11.2 10*3/uL — ABNORMAL HIGH (ref 4.0–10.5)
nRBC: 0 % (ref 0.0–0.2)

## 2023-08-27 LAB — BASIC METABOLIC PANEL
Anion gap: 7 (ref 5–15)
BUN: 20 mg/dL (ref 8–23)
CO2: 28 mmol/L (ref 22–32)
Calcium: 8.2 mg/dL — ABNORMAL LOW (ref 8.9–10.3)
Chloride: 102 mmol/L (ref 98–111)
Creatinine, Ser: 0.67 mg/dL (ref 0.44–1.00)
GFR, Estimated: >60 mL/min (ref >60)
Glucose, Bld: 140 mg/dL — ABNORMAL HIGH (ref 70–99)
Potassium: 4.7 mmol/L (ref 3.5–5.1)
Sodium: 137 mmol/L (ref 135–145)

## 2023-08-27 MED ORDER — CEPHALEXIN 500 MG PO CAPS
500.0000 mg | ORAL_CAPSULE | Freq: Two times a day (BID) | ORAL | Status: DC
  Administered 2023-08-27 – 2023-08-31 (×9): 500 mg via ORAL
  Filled 2023-08-27 (×9): qty 1

## 2023-08-27 MED ORDER — ORAL CARE MOUTH RINSE: 15.0000 mL | OROMUCOSAL | Status: DC | PRN

## 2023-08-27 NOTE — Progress Notes (Signed)
    Subjective:  Patient reports pain as mild to moderate.  Denies N/V/CP/SOB/Abd pain. She reports some numbness in her left foot today. She reports some soreness with movement today, to be expected.   Patient very tearful and upset this morning. We discussed need to ambulate out of bed for bathroom. She is very fearful of falling and we again discussed one step at a time. All concerns addressed this morning. Patient on board with plan prior to leaving this morning.   We discussed no IV narcotic medications as they are dropping her O2 STATs. We discussed in depth about exercises for her to do in bed including quad set and ankle pumps, and upper extremity strengthening. We discussed to use incentive spirometry 10x/hour for temperature regulation.   Objective:   VITALS:   Vitals:   08/26/23 1318 08/26/23 1853 08/26/23 2121 08/27/23 0708  BP: (!) 104/47 138/73 124/63 (!) 117/59  Pulse: 81  (!) 110 (!) 113  Resp: 18 18 18 18   Temp: 99.4 F (37.4 C) 99.2 F (37.3 C) 98.2 F (36.8 C) 99.1 F (37.3 C)  TempSrc:   Oral Oral  SpO2: 98%  98% 97%  Weight:      Height:        Patient lying in bed. Very tearful and upset this morning. Okay prior to leaving room today. Otherwise NAD Neurologically intact ABD soft Neurovascular intact Sensation intact distally Intact pulses distally Dorsiflexion/Plantar flexion intact No cellulitis present Compartment soft She reports some foot numbness this morning.  Prevena incisional dressing C/D/I, 75 mmHg no leaks detected.  JP drain 90 cc SS fluid. Hemovac drain 93 cc bloody drainage  Lab Results  Component Value Date   WBC 11.2 (H) 08/27/2023   HGB 8.9 (L) 08/27/2023   HCT 28.2 (L) 08/27/2023   MCV 102.2 (H) 08/27/2023   PLT 124 (L) 08/27/2023   BMET    Component Value Date/Time   NA 137 08/27/2023 0344   K 4.7 08/27/2023 0344   CL 102 08/27/2023 0344   CO2 28 08/27/2023 0344   GLUCOSE 140 (H) 08/27/2023 0344   BUN 20 08/27/2023  0344   CREATININE 0.67 08/27/2023 0344   CALCIUM 8.2 (L) 08/27/2023 0344   EGFR 101.0 04/13/2023 1511   GFRNONAA >60 08/27/2023 0344     Assessment/Plan: 2 Days Post-Op   Principal Problem:   Infected prosthetic knee joint (HCC) Active Problems:   Infection of total right knee replacement, subsequent encounter  ABLA. Hemoglobin 8.9 (10.3 yesterday). Continue to monitor.   WBAT with Knee immobilizer and walker. Do not bend the knee.  DVT ppx: Xarelto, SCDs, TEDS PO pain control: Limit narcotics due to desaturation.  PT/OT: Slow progression with PT yesterday, severe deconditioning. Continue PT today. Focus on quad strengthening and mobilizing with walker.  Dispo:  - Gram stain negative. Continue to monitor intraoperative cultures. Oral keflex started.  - Mupirocin ointment and nystatin powder daily.  - Continue JP drain until output <10cc.  - Continue hemovac drain until output <30cc.  - Continue Prevena incisional dressing on house vac . Upon discharge nurse to convert house vac to prevena portable vac.  - D/c pending drain output and PT clearance.    Clois Dupes, PA-C 08/27/2023, 10:10 AM   [facility]  [place] [address], [address], [place], [place] [address] Phone: [phone] [email or web address] Facebook  [employer]

## 2023-08-27 NOTE — Progress Notes (Signed)
Physical Therapy Treatment Patient Details Name: Norma Anthony MRN: [id number] DOB: 16-Jun-1956 Today's Date: 08/27/2023   History of Present Illness Pt s/p reimplantation of L TKR and with hx of DM, anxiety, L TKR, CAD, COPD, PE and Fibromyalgia    PT Comments  Pt continues cooperative but requiring increased time for all tasks.  Pt performed therex progam, assisted to bed side sitting and on second attempt completed step/pvt bed to chair.  Pt limited by c/o dizziness and nausea with mobility - BP 116/48, pulse 135, SaO2 94%.  Pt repositioned for comfort and RN alerted.   If plan is discharge home, recommend the following: A lot of help with walking and/or transfers;A lot of help with bathing/dressing/bathroom;Assistance with cooking/housework;Assist for transportation;Help with stairs or ramp for entrance   Can travel by private vehicle        Equipment Recommendations  None recommended by PT    Recommendations for Other Services       Precautions / Restrictions Precautions Precautions: Fall;Knee Required Braces or Orthoses: Knee Immobilizer - Left Knee Immobilizer - Left: Discontinue once straight leg raise with < 10 degree lag Restrictions Weight Bearing Restrictions: No LLE Weight Bearing: Weight bearing as tolerated     Mobility  Bed Mobility Overal bed mobility: Needs Assistance Bed Mobility: Supine to Sit     Supine to sit: Min assist, Mod assist, +2 for physical assistance, +2 for safety/equipment     General bed mobility comments: extensive use of bedrails, assist to manage L LE    Transfers Overall transfer level: Needs assistance Equipment used: Rolling walker (2 wheels) Transfers: Sit to/from Stand, Bed to chair/wheelchair/BSC Sit to Stand: Min assist, Mod assist, +2 physical assistance, +2 safety/equipment, From elevated surface   Step pivot transfers: Min assist, Mod assist, +2 physical assistance, +2 safety/equipment       General transfer comment:  cues for LE management and use of UEs to self assist.  Physical assist to bring wt up and fwd and to balance in standing with RW; step pvt bed to recliner    Ambulation/Gait         Gait velocity: decr     General Gait Details: step pvt bed to chair only 2* c/o dizziness and nausea with mobilty   Stairs             Wheelchair Mobility     Tilt Bed    Modified Rankin (Stroke Patients Only)       Balance Overall balance assessment: Needs assistance Sitting-balance support: No upper extremity supported, Feet supported Sitting balance-Leahy Scale: Good     Standing balance support: Bilateral upper extremity supported Standing balance-Leahy Scale: Poor                              Cognition Arousal: Alert Behavior During Therapy: Anxious Overall Cognitive Status: Within Functional Limits for tasks assessed                                          Exercises Total Joint Exercises Ankle Circles/Pumps: AROM, Both, 15 reps, Supine Quad Sets: AROM, Both, 10 reps, Supine Heel Slides: AAROM, Left, Supine, 20 reps Straight Leg Raises: AAROM, Left, 10 reps, Supine Goniometric ROM: -5 - 24 AAROM L knee    General Comments        Pertinent Vitals/Pain Pain  Assessment Pain Assessment: 0-10 Pain Score: 5  Pain Location: L knee Pain Descriptors / Indicators: Aching, Grimacing, Sore Pain Intervention(s): Limited activity within patient's tolerance, Monitored during session, Premedicated before session    Home Living                          Prior Function            PT Goals (current goals can now be found in the care plan section) Acute Rehab PT Goals Patient Stated Goal: Regain IND and be able to access bedroom on second level PT Goal Formulation: With patient Time For Goal Achievement: 09/09/23 Potential to Achieve Goals: Fair Progress towards PT goals: Progressing toward goals    Frequency    7X/week       PT Plan      Co-evaluation              AM-PAC PT "6 Clicks" Mobility   Outcome Measure  Help needed turning from your back to your side while in a flat bed without using bedrails?: A Lot Help needed moving from lying on your back to sitting on the side of a flat bed without using bedrails?: A Lot Help needed moving to and from a bed to a chair (including a wheelchair)?: A Lot Help needed standing up from a chair using your arms (e.g., wheelchair or bedside chair)?: A Lot Help needed to walk in hospital room?: Total Help needed climbing 3-5 steps with a railing? : Total 6 Click Score: 10    End of Session Equipment Utilized During Treatment: Gait belt;Left knee immobilizer Activity Tolerance: Other (comment) (nausea, dizziness) Patient left: in chair;with call bell/phone within reach;with chair alarm set;with family/visitor present Nurse Communication: Mobility status PT Visit Diagnosis: Unsteadiness on feet (R26.81);Muscle weakness (generalized) (M62.81);Difficulty in walking, not elsewhere classified (R26.2);History of falling (Z91.81);Pain Pain - Right/Left: Left Pain - part of body: Knee     Time: 1610-9604 PT Time Calculation (min) (ACUTE ONLY): 42 min  Charges:    $Therapeutic Exercise: 8-22 mins $Therapeutic Activity: 23-37 mins PT General Charges $$ ACUTE PT VISIT: 1 Visit                     Mauro Kaufmann PT Acute Rehabilitation Services Pager [phone] Office [phone]    Norma Anthony 08/27/2023, 1:26 PM

## 2023-08-27 NOTE — Progress Notes (Signed)
Physical Therapy Treatment Patient Details Name: Norma Anthony MRN: [id number] DOB: [birth year] Today's Date: 08/27/2023   History of Present Illness Pt s/p reimplantation of L TKR and with hx of DM, anxiety, L TKR, CAD, COPD, PE and Fibromyalgia    PT Comments  Pt continues cooperative and requiring increased time and significant assist for all tasks and limited by continued c/o nausea and dizziness with attempts to mobilize.   If plan is discharge home, recommend the following: A lot of help with walking and/or transfers;A lot of help with bathing/dressing/bathroom;Assistance with cooking/housework;Assist for transportation;Help with stairs or ramp for entrance   Can travel by private vehicle        Equipment Recommendations  None recommended by PT    Recommendations for Other Services       Precautions / Restrictions Precautions Precautions: Fall;Knee Required Braces or Orthoses: Knee Immobilizer - Left Knee Immobilizer - Left: Discontinue once straight leg raise with < 10 degree lag Restrictions Weight Bearing Restrictions: No LLE Weight Bearing: Weight bearing as tolerated     Mobility  Bed Mobility Overal bed mobility: Needs Assistance Bed Mobility: Sit to Supine     Supine to sit: Min assist, Mod assist, +2 for physical assistance, +2 for safety/equipment Sit to supine: Mod assist   General bed mobility comments: extensive use of bedrails, assist to manage L LE    Transfers Overall transfer level: Needs assistance Equipment used: Rolling walker (2 wheels) Transfers: Sit to/from Stand, Bed to chair/wheelchair/BSC Sit to Stand: Min assist, Mod assist, +2 physical assistance, +2 safety/equipment, From elevated surface   Step pivot transfers: Min assist, Mod assist, +2 physical assistance, +2 safety/equipment       General transfer comment: cues for LE management and use of UEs to self assist.  Physical assist to bring wt up and fwd and to balance in standing with  RW; step pvt bed to recliner    Ambulation/Gait         Gait velocity: decr     General Gait Details: step pvt chair to bed only 2* c/o dizziness and nausea with mobilty   Stairs             Wheelchair Mobility     Tilt Bed    Modified Rankin (Stroke Patients Only)       Balance Overall balance assessment: Needs assistance Sitting-balance support: No upper extremity supported, Feet supported Sitting balance-Leahy Scale: Good     Standing balance support: Bilateral upper extremity supported Standing balance-Leahy Scale: Poor                              Cognition Arousal: Alert Behavior During Therapy: Anxious Overall Cognitive Status: Within Functional Limits for tasks assessed                                          Exercises Total Joint Exercises Ankle Circles/Pumps: AROM, Both, 15 reps, Supine Quad Sets: AROM, Both, 10 reps, Supine Heel Slides: AAROM, Left, Supine, 20 reps Straight Leg Raises: AAROM, Left, 10 reps, Supine Goniometric ROM: -5 - 24 AAROM L knee    General Comments        Pertinent Vitals/Pain Pain Assessment Pain Assessment: No/denies pain Pain Score: 5  Pain Location: L knee Pain Descriptors / Indicators: Aching, Grimacing, Sore Pain Intervention(s): Limited activity within patient's  tolerance, Monitored during session, Ice applied    Home Living                          Prior Function            PT Goals (current goals can now be found in the care plan section) Acute Rehab PT Goals Patient Stated Goal: Regain IND and be able to access bedroom on second level PT Goal Formulation: With patient Time For Goal Achievement: 09/09/23 Potential to Achieve Goals: Fair Progress towards PT goals: Not progressing toward goals - comment (ltd by fatigue, nausea and c/o dizziness with attempts to mobilize)    Frequency    7X/week      PT Plan      Co-evaluation               AM-PAC PT "6 Clicks" Mobility   Outcome Measure  Help needed turning from your back to your side while in a flat bed without using bedrails?: A Lot Help needed moving from lying on your back to sitting on the side of a flat bed without using bedrails?: A Lot Help needed moving to and from a bed to a chair (including a wheelchair)?: A Lot Help needed standing up from a chair using your arms (e.g., wheelchair or bedside chair)?: A Lot Help needed to walk in hospital room?: Total Help needed climbing 3-5 steps with a railing? : Total 6 Click Score: 10    End of Session Equipment Utilized During Treatment: Gait belt;Left knee immobilizer Activity Tolerance: Other (comment) (c/o dizziness and nausea with mobility) Patient left: in bed;with call bell/phone within reach;with bed alarm set;with family/visitor present Nurse Communication: Mobility status PT Visit Diagnosis: Unsteadiness on feet (R26.81);Muscle weakness (generalized) (M62.81);Difficulty in walking, not elsewhere classified (R26.2);History of falling (Z91.81);Pain Pain - Right/Left: Left Pain - part of body: Knee     Time: 1430-1453 PT Time Calculation (min) (ACUTE ONLY): 23 min  Charges:    $Therapeutic Exercise: 8-22 mins $Therapeutic Activity: 23-37 mins PT General Charges $$ ACUTE PT VISIT: 1 Visit                     Mauro Kaufmann PT Acute Rehabilitation Services Pager [phone] Office [phone]    Zaylei Mullane 08/27/2023, 2:58 PM

## 2023-08-28 LAB — COMPREHENSIVE METABOLIC PANEL
ALT: 12 U/L (ref 0–44)
AST: 17 U/L (ref 15–41)
Albumin: 2.6 g/dL — ABNORMAL LOW (ref 3.5–5.0)
Alkaline Phosphatase: 50 U/L (ref 38–126)
Anion gap: 9 (ref 5–15)
BUN: 17 mg/dL (ref 8–23)
CO2: 28 mmol/L (ref 22–32)
Calcium: 8.4 mg/dL — ABNORMAL LOW (ref 8.9–10.3)
Chloride: 100 mmol/L (ref 98–111)
Creatinine, Ser: 0.52 mg/dL (ref 0.44–1.00)
GFR, Estimated: >60 mL/min (ref >60)
Glucose, Bld: 112 mg/dL — ABNORMAL HIGH (ref 70–99)
Potassium: 4 mmol/L (ref 3.5–5.1)
Sodium: 137 mmol/L (ref 135–145)
Total Bilirubin: 0.6 mg/dL (ref 0.3–1.2)
Total Protein: 5.7 g/dL — ABNORMAL LOW (ref 6.5–8.1)

## 2023-08-28 MED ORDER — FLUCONAZOLE 150 MG PO TABS
150.0000 mg | ORAL_TABLET | Freq: Once | ORAL | Status: AC
  Administered 2023-08-28: 150 mg via ORAL
  Filled 2023-08-28: qty 1

## 2023-08-28 NOTE — Plan of Care (Signed)
  Problem: Pain Management: Goal: Pain level will decrease with appropriate interventions Outcome: Progressing   

## 2023-08-28 NOTE — Progress Notes (Signed)
   Subjective: 3 Days Post-Op Procedure(s) (LRB): Knee spacer removal (Left) TOTAL KNEE REVISION (Left)  Pt states pain is slightly better Still needs moderate assist with therapy Denies any new symptoms or issues other than pain Patient reports pain as moderate. C/o injury to left 5th toenail  Objective:   VITALS:   Vitals:   08/28/23 0203 08/28/23 0537  BP: (!) 104/54 (!) 101/46  Pulse: (!) 109 (!) 109  Resp: 16 15  Temp: 98.6 F (37 C) 98.9 F (37.2 C)  SpO2: 93% 95%    Left knee: well healing incision  Dressing intact Guarded rom with the left knee with pain Left 5th toe with no signs of fracture, band aid in place, no active bleeding or drainage Nv intact distally Slight edema bilaterally distally   LABS Recent Labs    08/25/23 1142 08/26/23 0428 08/27/23 0344  HGB 14.1 10.3* 8.9*  HCT 45.4 34.0* 28.2*  WBC 10.3 15.3* 11.2*  PLT 188 183 124*    Recent Labs    08/26/23 0337 08/27/23 0344 08/28/23 0410  NA 137 137 137  K 4.9 4.7 4.0  BUN 11 20 17   CREATININE 0.80 0.67 0.52  GLUCOSE 138* 140* 112*     Assessment/Plan: 3 Days Post-Op Procedure(s) (LRB): Knee spacer removal (Left) TOTAL KNEE REVISION (Left) Continue PT/OT Pain management D/c planning once she improves with therapy     Elizebeth Koller, MPAS [facility] is now [facility]  [place] [address], [place], [place] [address] Phone: [phone] [email or web address] Facebook  [employer]

## 2023-08-28 NOTE — TOC Initial Note (Addendum)
Transition of Care [facility]) - Initial/Assessment Note    Patient Details  Name: Norma Anthony MRN: [id number] Date of Birth: [birth year]  [facility] CM/SW Contact:    Catalina Gravel, LCSW Phone Number: 08/28/2023, 2:23 PM  Clinical Narrative:                 Pt is high risk for readmission. CSW completed assessment with pt and  spouse at bedside. Pt has the assistance of spouse in home.  Pt states no stairs in home.  Spouse reports that he does cooking and cleaning and able to assist with ADL's. Pts spouse is able to provide transportation to appointments when needed. Pt has a walker in home that was given to her, pt inquired about assistance ordering a new walker as well as a shower chair.   Walker was not ordered/recommended by PT shared that with pt and she states that she wants to have one.  CSW shared some DME could have a co pay, pt and spouse felt they have insurance to cover.  Agreed to order the equipment per request.    CSW confirmed pt will have OPPT at [facility] in [place]. TOC to follow.   Addendum: CSW consulted with DME provider.  Shower Chair would be at cost.  Followed up with pt/spouse.  They will shop around.  They would like the front wh RW. Secure chat to PT regarding request. PT updated note as pt was borrowing a RW and needs one. CSW contacted DME provider [employer] to request RW. Left private  message for return call.  Expected Discharge Plan: OP Rehab Barriers to Discharge: Continued Medical Work up   Patient Goals and CMS Choice Patient states their goals for this hospitalization and ongoing recovery are:: Return home and OPPT          Expected Discharge Plan and Services In-house Referral: Clinical Social Work     Living arrangements for the past 2 months: Single Family Home                                      Prior Living Arrangements/Services Living arrangements for the past 2 months: Single Family Home Lives with::  Spouse Patient language and need for interpreter reviewed:: Yes Do you feel safe going back to the place where you live?: Yes      Need for Family Participation in Patient Care: Yes (Comment) Care giver support system in place?: Yes (comment) (Spouse assists with care, cooking in home.)   Criminal Activity/Legal Involvement Pertinent to Current Situation/Hospitalization: No - Comment as needed  Activities of Daily Living Home Assistive Devices/Equipment: Brace (specify type), Blood pressure cuff, Wheelchair, Hospital bed, Walker (specify type), Cane (specify quad or straight), Bedside commode/3-in-1 ADL Screening (condition at time of admission) Patient's cognitive ability adequate to safely complete daily activities?: Yes Is the patient deaf or have difficulty hearing?: No Does the patient have difficulty seeing, even when wearing glasses/contacts?: No Does the patient have difficulty concentrating, remembering, or making decisions?: Yes Patient able to express need for assistance with ADLs?: Yes Does the patient have difficulty dressing or bathing?: Yes Independently performs ADLs?: No Communication: Independent Dressing (OT): Needs assistance Is this a change from baseline?: Pre-admission baseline Grooming: Needs assistance Is this a change from baseline?: Pre-admission baseline Feeding: Independent Bathing: Needs assistance Is this a change from baseline?: Pre-admission baseline Toileting: Needs assistance Is this  a change from baseline?: Pre-admission baseline In/Out Bed: Needs assistance Is this a change from baseline?: Pre-admission baseline Walks in Home: Needs assistance Is this a change from baseline?: Change from baseline, expected to last <3 days Does the patient have difficulty walking or climbing stairs?: Yes Weakness of Legs: Left Weakness of Arms/Hands: None  Permission Sought/Granted                  Emotional Assessment Appearance:: Appears stated  age Attitude/Demeanor/Rapport: Gracious Affect (typically observed): Accepting, Appropriate Orientation: : Oriented to Self, Oriented to Place, Oriented to Situation Alcohol / Substance Use: Not Applicable    Admission diagnosis:  Infection of total right knee replacement, subsequent encounter [T84.53XD] Patient Active Problem List   Diagnosis Date Noted   Infection of total right knee replacement, 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/25/2023   Long term current use of antibiotics 04/14/2023   Intertriginous dermatitis associated with moisture 04/14/2023   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   Surgical wound dehiscence 03/08/2023   Failed total knee, 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   Patellar dislocation 12/11/2022   Hemarthrosis of left knee 12/11/2022   Wound dehiscence, surgical, initial encounter 12/11/2022   Chronic respiratory failure with hypoxia (HCC) 12/11/2022   Generalized anxiety disorder 12/11/2022   Patellar dislocation, left, initial encounter 12/11/2022   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   DOE (dyspnea on exertion) 04/30/2022   Ex-smoker 04/30/2022   Upper airway cough syndrome 04/30/2022   Lung nodule 03/24/2022   Thyroid nodule 03/24/2022   Lobar pneumonia (HCC) 03/24/2022   Acute respiratory failure with hypoxia (HCC) 03/23/2022   Opioid dependence (HCC) 03/23/2022   COPD (chronic obstructive pulmonary disease) (HCC) 03/22/2022   Chest pain 03/22/2022   Obstructive sleep apnea syndrome 07/01/2021   History of pulmonary embolism 02/12/2021   Gastroesophageal reflux disease without esophagitis 05/10/2014   Fibromyalgia 05/10/2014   PCP:  [facility]:   [id number] - [place], [place] - [address] [place] [address] Phone: [phone] Fax: [phone]     Social Determinants of Health (SDOH) Social History: SDOH Screenings   Food  Insecurity: No Food Insecurity (08/25/2023)  Housing: Low Risk  (08/25/2023)  Transportation Needs: No Transportation Needs (08/25/2023)  Utilities: Not At Risk (08/25/2023)  Depression (PHQ2-9): Low Risk  (04/07/2023)  Financial Resource Strain: Low Risk  (04/23/2021)   Received from [facility]  Social Connections: Unknown (04/16/2022)   Received from [facility], [facility]  Stress: Stress Concern Present (04/23/2021)   Received from [facility], [facility]  Tobacco Use: Medium Risk (08/25/2023)   SDOH Interventions:     Readmission Risk Interventions    08/28/2023    2:19 PM 03/11/2023    2:01 PM 11/06/2022    9:37 AM  Readmission Risk Prevention Plan  Transportation Screening Complete Complete Complete  PCP or Specialist Appt within 3-5 Days Complete  Complete  HRI or Home Care Consult Complete  Complete  Social Work Consult for Recovery Care Planning/Counseling Complete  Complete  Palliative Care Screening Not Applicable  Not Applicable  Medication Review Oceanographer) Complete Complete Complete  PCP or Specialist appointment within 3-5 days of discharge  Complete   HRI or Home Care Consult  Complete   SW Recovery Care/Counseling Consult  Complete   Palliative Care Screening  Not Applicable   Skilled Nursing Facility  Complete

## 2023-08-28 NOTE — Progress Notes (Signed)
Physical Therapy Treatment Patient Details Name: Norma Anthony MRN: [id number] DOB: Jul 04, 1956 Today's Date: 08/28/2023   History of Present Illness Pt s/p reimplantation of L TKR and with hx of DM, anxiety, L TKR, CAD, COPD, PE and Fibromyalgia    PT Comments  Pt continues cooperative and with noted improvement in activity tolerance including ambulating short distances in room and up to [facility] for toileting.  However, pt continues unsteady on feet and fatigues very easily.    If plan is discharge home, recommend the following: A lot of help with walking and/or transfers;A lot of help with bathing/dressing/bathroom;Assistance with cooking/housework;Assist for transportation;Help with stairs or ramp for entrance   Can travel by private vehicle        Equipment Recommendations  Rolling walker (2 wheels)    Recommendations for Other Services       Precautions / Restrictions Precautions Precautions: Fall;Knee Required Braces or Orthoses: Knee Immobilizer - Left Knee Immobilizer - Left: Discontinue once straight leg raise with < 10 degree lag Restrictions Weight Bearing Restrictions: No LLE Weight Bearing: Weight bearing as tolerated     Mobility  Bed Mobility Overal bed mobility: Needs Assistance Bed Mobility: Supine to Sit, Sit to Supine     Supine to sit: Min assist, Mod assist Sit to supine: Min assist, Mod assist   General bed mobility comments: use of bedrails, assist to manage L LE    Transfers Overall transfer level: Needs assistance Equipment used: Rolling walker (2 wheels) Transfers: Sit to/from Stand Sit to Stand: Min assist, Mod assist, +2 safety/equipment, From elevated surface           General transfer comment: cues for LE management and use of UEs to self assist.  Physical assist to bring wt up and fwd and to balance in standing with RW;    Ambulation/Gait Ambulation/Gait assistance: Min assist, Mod assist, +2 safety/equipment Gait Distance (Feet): 7  Feet (7' twice) Assistive device: Rolling walker (2 wheels) Gait Pattern/deviations: Step-to pattern, Decreased step length - right, Decreased step length - left, Shuffle, Trunk flexed Gait velocity: decr     General Gait Details: increased time with cues for sequence, posture and position from RW.  C/o dizziness with increased distance   Stairs             Wheelchair Mobility     Tilt Bed    Modified Rankin (Stroke Patients Only)       Balance Overall balance assessment: Needs assistance Sitting-balance support: No upper extremity supported, Feet supported Sitting balance-Leahy Scale: Good     Standing balance support: Bilateral upper extremity supported Standing balance-Leahy Scale: Poor                              Cognition Arousal: Alert Behavior During Therapy: Anxious Overall Cognitive Status: Within Functional Limits for tasks assessed                                          Exercises Total Joint Exercises Ankle Circles/Pumps: AROM, Both, 15 reps, Supine Quad Sets: AROM, Both, 10 reps, Supine Heel Slides: AAROM, Left, Supine, 20 reps Straight Leg Raises: AAROM, Left, Supine, 20 reps Goniometric ROM: -5 - 30 AAROM L knee    General Comments        Pertinent Vitals/Pain Pain Assessment Pain Assessment: 0-10 Pain Score: 5  Pain Location: L knee Pain Descriptors / Indicators: Aching, Grimacing, Sore Pain Intervention(s): Limited activity within patient's tolerance, Monitored during session, Premedicated before session, Ice applied    Home Living                          Prior Function            PT Goals (current goals can now be found in the care plan section) Acute Rehab PT Goals Patient Stated Goal: Regain IND and be able to access bedroom on second level PT Goal Formulation: With patient Time For Goal Achievement: 09/09/23 Potential to Achieve Goals: Fair Progress towards PT goals: Progressing  toward goals    Frequency    7X/week      PT Plan      Co-evaluation              AM-PAC PT "6 Clicks" Mobility   Outcome Measure  Help needed turning from your back to your side while in a flat bed without using bedrails?: A Lot Help needed moving from lying on your back to sitting on the side of a flat bed without using bedrails?: A Lot Help needed moving to and from a bed to a chair (including a wheelchair)?: A Lot Help needed standing up from a chair using your arms (e.g., wheelchair or bedside chair)?: A Lot Help needed to walk in hospital room?: Total Help needed climbing 3-5 steps with a railing? : Total 6 Click Score: 10    End of Session Equipment Utilized During Treatment: Gait belt;Left knee immobilizer Activity Tolerance: Patient limited by fatigue Patient left: in bed;with call bell/phone within reach;with bed alarm set;with family/visitor present Nurse Communication: Mobility status PT Visit Diagnosis: Unsteadiness on feet (R26.81);Muscle weakness (generalized) (M62.81);Difficulty in walking, not elsewhere classified (R26.2);History of falling (Z91.81);Pain Pain - Right/Left: Left Pain - part of body: Knee     Time: 6045-4098 PT Time Calculation (min) (ACUTE ONLY): 33 min  Charges:    $Gait Training: 8-22 mins $Therapeutic Activity: 8-22 mins PT General Charges $$ ACUTE PT VISIT: 1 Visit                     Mauro Kaufmann PT Acute Rehabilitation Services Pager [phone] Office [phone]    [facility] 08/28/2023, 4:33 PM

## 2023-08-28 NOTE — Progress Notes (Signed)
Physical Therapy Treatment Patient Details Name: Norma Anthony MRN: [id number] DOB: [birth year] Today's Date: 08/28/2023   History of Present Illness Pt s/p reimplantation of L TKR and with hx of DM, anxiety, L TKR, CAD, COPD, PE and Fibromyalgia    PT Comments  Pt in good spirits and with noted progress in all tasks - decreasing level of assist, increased ease of movement and pt able to ambulate in room limited distance beyond bed<>chair transfers.    If plan is discharge home, recommend the following: A lot of help with walking and/or transfers;A lot of help with bathing/dressing/bathroom;Assistance with cooking/housework;Assist for transportation;Help with stairs or ramp for entrance   Can travel by private vehicle        Equipment Recommendations  None recommended by PT    Recommendations for Other Services       Precautions / Restrictions Precautions Precautions: Fall;Knee Required Braces or Orthoses: Knee Immobilizer - Left Knee Immobilizer - Left: Discontinue once straight leg raise with < 10 degree lag Restrictions Weight Bearing Restrictions: No LLE Weight Bearing: Weight bearing as tolerated     Mobility  Bed Mobility Overal bed mobility: Needs Assistance Bed Mobility: Sit to Supine       Sit to supine: Min assist, Mod assist   General bed mobility comments: use of bedrails, assist to manage L LE    Transfers Overall transfer level: Needs assistance Equipment used: Rolling walker (2 wheels) Transfers: Sit to/from Stand Sit to Stand: Min assist, Mod assist, +2 safety/equipment, From elevated surface           General transfer comment: cues for LE management and use of UEs to self assist.  Physical assist to bring wt up and fwd and to balance in standing with RW; step pvt bed to recliner    Ambulation/Gait Ambulation/Gait assistance: Min assist, Mod assist, +2 physical assistance, +2 safety/equipment Gait Distance (Feet): 7 Feet Assistive device: Rolling  walker (2 wheels) Gait Pattern/deviations: Step-to pattern, Decreased step length - right, Decreased step length - left, Shuffle, Trunk flexed Gait velocity: decr     General Gait Details: increased time with cues for sequence, posture and position from RW.  C/o dizziness with increased distance   Stairs             Wheelchair Mobility     Tilt Bed    Modified Rankin (Stroke Patients Only)       Balance Overall balance assessment: Needs assistance Sitting-balance support: No upper extremity supported, Feet supported Sitting balance-Leahy Scale: Good     Standing balance support: Bilateral upper extremity supported Standing balance-Leahy Scale: Poor                              Cognition Arousal: Alert Behavior During Therapy: Anxious Overall Cognitive Status: Within Functional Limits for tasks assessed                                          Exercises Total Joint Exercises Ankle Circles/Pumps: AROM, Both, 15 reps, Supine Quad Sets: AROM, Both, 10 reps, Supine Heel Slides: AAROM, Left, Supine, 20 reps Straight Leg Raises: AAROM, Left, Supine, 20 reps Goniometric ROM: -5 - 30 AAROM L knee    General Comments        Pertinent Vitals/Pain Pain Assessment Pain Assessment: 0-10 Pain Score: 5  Pain Location:  L knee Pain Descriptors / Indicators: Aching, Grimacing, Sore Pain Intervention(s): Limited activity within patient's tolerance, Monitored during session, Premedicated before session, Ice applied    Home Living                          Prior Function            PT Goals (current goals can now be found in the care plan section) Acute Rehab PT Goals Patient Stated Goal: Regain IND and be able to access bedroom on second level PT Goal Formulation: With patient Time For Goal Achievement: 09/09/23 Potential to Achieve Goals: Fair Progress towards PT goals: Progressing toward goals    Frequency     7X/week      PT Plan      Co-evaluation              AM-PAC PT "6 Clicks" Mobility   Outcome Measure  Help needed turning from your back to your side while in a flat bed without using bedrails?: A Lot Help needed moving from lying on your back to sitting on the side of a flat bed without using bedrails?: A Lot Help needed moving to and from a bed to a chair (including a wheelchair)?: A Lot Help needed standing up from a chair using your arms (e.g., wheelchair or bedside chair)?: A Lot Help needed to walk in hospital room?: Total Help needed climbing 3-5 steps with a railing? : Total 6 Click Score: 10    End of Session Equipment Utilized During Treatment: Gait belt;Left knee immobilizer Activity Tolerance: Patient limited by fatigue Patient left: in bed;with call bell/phone within reach;with bed alarm set;with family/visitor present Nurse Communication: Mobility status PT Visit Diagnosis: Unsteadiness on feet (R26.81);Muscle weakness (generalized) (M62.81);Difficulty in walking, not elsewhere classified (R26.2);History of falling (Z91.81);Pain Pain - Right/Left: Left Pain - part of body: Knee     Time: 1610-9604 PT Time Calculation (min) (ACUTE ONLY): 38 min  Charges:    $Gait Training: 8-22 mins $Therapeutic Exercise: 8-22 mins $Therapeutic Activity: 8-22 mins PT General Charges $$ ACUTE PT VISIT: 1 Visit                     Norma Anthony PT Acute Rehabilitation Services Pager [phone] Office [phone]    Norma Anthony 08/28/2023, 12:58 PM

## 2023-08-28 NOTE — Plan of Care (Signed)

## 2023-08-29 ENCOUNTER — Encounter (HOSPITAL_COMMUNITY): Payer: Self-pay | Admitting: Orthopedic Surgery

## 2023-08-29 LAB — COMPREHENSIVE METABOLIC PANEL
ALT: 11 U/L (ref 0–44)
AST: 14 U/L — ABNORMAL LOW (ref 15–41)
Albumin: 2.5 g/dL — ABNORMAL LOW (ref 3.5–5.0)
Alkaline Phosphatase: 46 U/L (ref 38–126)
Anion gap: 8 (ref 5–15)
BUN: 14 mg/dL (ref 8–23)
CO2: 32 mmol/L (ref 22–32)
Calcium: 8.3 mg/dL — ABNORMAL LOW (ref 8.9–10.3)
Chloride: 98 mmol/L (ref 98–111)
Creatinine, Ser: 0.56 mg/dL (ref 0.44–1.00)
GFR, Estimated: >60 mL/min (ref >60)
Glucose, Bld: 116 mg/dL — ABNORMAL HIGH (ref 70–99)
Potassium: 3.5 mmol/L (ref 3.5–5.1)
Sodium: 138 mmol/L (ref 135–145)
Total Bilirubin: 0.6 mg/dL (ref 0.3–1.2)
Total Protein: 5.7 g/dL — ABNORMAL LOW (ref 6.5–8.1)

## 2023-08-29 NOTE — Evaluation (Signed)
Occupational Therapy Evaluation Patient Details Name: Norma Anthony MRN: [id number] DOB: June 05, 1956 Today's Date: 08/29/2023   History of Present Illness Patient is a 67 year old female who is s/p reimplantation of L TKR PMH: DM, anxiety, L TKR, CAD, COPD, PE and Fibromyalgia   Clinical Impression   Patient is a 67 year old female who was admitted for above. Patient reported having been at home with minimal ability to transfer since march with various knee issues. Patient is motivated to bea ble to engage in more tasks now that surgery intervention has been completed. Patient was TD for Lb dressing/bathing tasks at chair level with increased A to maintain restrictions. Patient is currently +2 for transfers with increased time.  Patient was noted to have decreased functional activity tolerance, decreased endurance, decreased standing balance, decreased safety awareness, and decreased knowledge of AD/AE impacting participation in ADLs. Patient would continue to benefit from skilled OT services at this time while admitted and after d/c to address noted deficits in order to improve overall safety and independence in ADLs.        If plan is discharge home, recommend the following: Two people to help with walking and/or transfers;A lot of help with bathing/dressing/bathroom;Assistance with cooking/housework;Direct supervision/assist for medications management;Assist for transportation;Help with stairs or ramp for entrance;Direct supervision/assist for financial management    Functional Status Assessment  Patient has had a recent decline in their functional status and demonstrates the ability to make significant improvements in function in a reasonable and predictable amount of time.  Equipment Recommendations  None recommended by OT    Recommendations for Other Services       Precautions / Restrictions Precautions Precautions: Fall;Knee Required Braces or Orthoses: Knee Immobilizer - Left Knee  Immobilizer - Left: Discontinue once straight leg raise with < 10 degree lag Restrictions Weight Bearing Restrictions: No LLE Weight Bearing: Weight bearing as tolerated      Mobility Bed Mobility               General bed mobility comments: patient was up in recliner and remained in the same after session.    Transfers                              ADL either performed or assessed with clinical judgement   ADL Overall ADL's : Needs assistance/impaired Eating/Feeding: Set up;Sitting   Grooming: Set up;Sitting;Brushing hair;Wash/dry face;Wash/dry hands;Oral care Grooming Details (indicate cue type and reason): in recliner. min A for using dry shampoo during session. Upper Body Bathing: Set up;Sitting Upper Body Bathing Details (indicate cue type and reason): in recliner Lower Body Bathing: Moderate assistance;Sitting/lateral leans Lower Body Bathing Details (indicate cue type and reason): in recliner with patient able to simulate leaning in recliner. Upper Body Dressing : Set up;Sitting Upper Body Dressing Details (indicate cue type and reason): in recliner Lower Body Dressing: Maximal assistance;Sitting/lateral leans;Sit to/from stand Lower Body Dressing Details (indicate cue type and reason): simulated leaning to increase patients ability to engage in LB Dressing tasks.   Toilet Transfer Details (indicate cue type and reason): patient declined to attempt standing at this time. Toileting- Clothing Manipulation and Hygiene: Bed level;Total assistance               Vision   Vision Assessment?: No apparent visual deficits            Pertinent Vitals/Pain Pain Assessment Pain Assessment: 0-10 Pain Score: 4  Pain Location:  L knee Pain Descriptors / Indicators: Aching, Grimacing, Sore Pain Intervention(s): Limited activity within patient's tolerance, Monitored during session, Premedicated before session     Extremity/Trunk Assessment Upper Extremity  Assessment Upper Extremity Assessment: Overall WFL for tasks assessed   Lower Extremity Assessment Lower Extremity Assessment: Defer to PT evaluation       Communication Communication Communication: No apparent difficulties   Cognition Arousal: Alert Behavior During Therapy: Anxious Overall Cognitive Status: Within Functional Limits for tasks assessed                                                  Home Living Family/patient expects to be discharged to:: Private residence Living Arrangements: Spouse/significant other Available Help at Discharge: Family;Available 24 hours/day Type of Home: House Home Access: Stairs to enter [employer] of Steps: 2 Entrance Stairs-Rails: None Home Layout: One level     Bathroom Shower/Tub: Chief Strategy Officer: Handicapped height Bathroom Accessibility: Yes   Home Equipment: Agricultural consultant (2 wheels);Cane - single point;Wheelchair Financial trader (4 wheels);BSC/3in1          Prior Functioning/Environment Prior Level of Function : History of Falls (last six months);Needs assist             Mobility Comments: Pt largely bedbound since spacer placement 3/24 ADLs Comments: Assist for all ADL        OT Problem List: Decreased activity tolerance;Impaired balance (sitting and/or standing);Decreased coordination;Decreased safety awareness;Decreased knowledge of precautions;Decreased knowledge of use of DME or AE;Pain      OT Treatment/Interventions: Self-care/ADL training;Energy conservation;Therapeutic exercise;DME and/or AE instruction;Therapeutic activities;Patient/family education;Balance training    OT Goals(Current goals can be found in the care plan section) Acute Rehab OT Goals Patient Stated Goal: to go home OT Goal Formulation: With patient Time For Goal Achievement: 09/12/23 Potential to Achieve Goals: Fair  OT Frequency: Min 2X/week       AM-PAC OT "6 Clicks" Daily  Activity     Outcome Measure Help from another person eating meals?: None Help from another person taking care of personal grooming?: None Help from another person toileting, which includes using toliet, bedpan, or urinal?: Total Help from another person bathing (including washing, rinsing, drying)?: A Lot Help from another person to put on and taking off regular upper body clothing?: A Little Help from another person to put on and taking off regular lower body clothing?: A Lot 6 Click Score: 16   End of Session Nurse Communication: Mobility status  Activity Tolerance: Patient tolerated treatment well Patient left: in chair;with call bell/phone within reach;with chair alarm set  OT Visit Diagnosis: Unsteadiness on feet (R26.81);Other abnormalities of gait and mobility (R26.89);Repeated falls (R29.6)                Time: 4166-0630 OT Time Calculation (min): 29 min Charges:  OT General Charges $OT Visit: 1 Visit OT Evaluation $OT Eval Low Complexity: 1 Low OT Treatments $Self Care/Home Management : 8-22 mins  Rosalio Loud, MS Acute Rehabilitation Department Office# [phone]   Selinda Flavin 08/29/2023, 3:35 PM

## 2023-08-29 NOTE — Progress Notes (Signed)
Physical Therapy Treatment Patient Details Name: Norma Anthony MRN: [id number] DOB: 03-01-1956 Today's Date: 08/29/2023   History of Present Illness Patient is a 67 year old female who is s/p reimplantation of L TKR PMH: DM, anxiety, L TKR, CAD, COPD, PE and Fibromyalgia    PT Comments  Pt continues very cooperative and progressing steadily with mobility.  Pt up to ambulate limited distance in hall with no c/o dizziness and up to [facility] for toileting before assist to bed with min assist.    If plan is discharge home, recommend the following: A lot of help with walking and/or transfers;A lot of help with bathing/dressing/bathroom;Assistance with cooking/housework;Assist for transportation;Help with stairs or ramp for entrance   Can travel by private vehicle        Equipment Recommendations  Rolling walker (2 wheels)    Recommendations for Other Services       Precautions / Restrictions Precautions Precautions: Fall;Knee Required Braces or Orthoses: Knee Immobilizer - Left Knee Immobilizer - Left: Discontinue once straight leg raise with < 10 degree lag Restrictions Weight Bearing Restrictions: No LLE Weight Bearing: Weight bearing as tolerated     Mobility  Bed Mobility Overal bed mobility: Needs Assistance Bed Mobility: Sit to Supine     Supine to sit: Min assist, Mod assist Sit to supine: Min assist   General bed mobility comments: cues for sequence with assist to manage L LE only    Transfers Overall transfer level: Needs assistance Equipment used: Rolling walker (2 wheels) Transfers: Sit to/from Stand, Bed to chair/wheelchair/BSC Sit to Stand: Min assist, Mod assist   Step pivot transfers: Min assist, From elevated surface       General transfer comment: cues for LE management and use of UEs to self assist.  Physical assist to bring wt up and fwd and to balance in standing with RW;  step pvt BC to bedside    Ambulation/Gait Ambulation/Gait assistance: Min assist,  +2 safety/equipment Gait Distance (Feet): 27 Feet Assistive device: Rolling walker (2 wheels) Gait Pattern/deviations: Step-to pattern, Decreased step length - right, Decreased step length - left, Shuffle, Trunk flexed Gait velocity: decr     General Gait Details: increased time with min cues for sequence, posture and position from RW.  No c/o dizziness this pm   Stairs             Wheelchair Mobility     Tilt Bed    Modified Rankin (Stroke Patients Only)       Balance Overall balance assessment: Needs assistance Sitting-balance support: No upper extremity supported, Feet supported Sitting balance-Leahy Scale: Good     Standing balance support: Bilateral upper extremity supported Standing balance-Leahy Scale: Poor                              Cognition Arousal: Alert Behavior During Therapy: Anxious Overall Cognitive Status: Within Functional Limits for tasks assessed                                          Exercises Total Joint Exercises Ankle Circles/Pumps: AROM, Both, 15 reps, Supine Quad Sets: AROM, Both, 10 reps, Supine Heel Slides: AAROM, Left, Supine, 20 reps Straight Leg Raises: AAROM, Left, Supine, 20 reps    General Comments        Pertinent Vitals/Pain Pain Assessment Pain Assessment: 0-10  Pain Score: 4  Pain Location: L knee Pain Descriptors / Indicators: Aching, Grimacing, Sore Pain Intervention(s): Limited activity within patient's tolerance, Monitored during session, Premedicated before session, Ice applied    Home Living Family/patient expects to be discharged to:: Private residence Living Arrangements: Spouse/significant other Available Help at Discharge: Family;Available 24 hours/day Type of Home: House Home Access: Stairs to enter Entrance Stairs-Rails: None Entrance Stairs-Number of Steps: 2   Home Layout: One level Home Equipment: Agricultural consultant (2 wheels);Cane - single point;Wheelchair Teacher, English as a foreign language (4 wheels);BSC/3in1      Prior Function            PT Goals (current goals can now be found in the care plan section) Acute Rehab PT Goals Patient Stated Goal: Regain IND and be able to access bedroom on second level PT Goal Formulation: With patient Time For Goal Achievement: 09/09/23 Potential to Achieve Goals: Fair Progress towards PT goals: Progressing toward goals    Frequency    7X/week      PT Plan      Co-evaluation              AM-PAC PT "6 Clicks" Mobility   Outcome Measure  Help needed turning from your back to your side while in a flat bed without using bedrails?: A Little Help needed moving from lying on your back to sitting on the side of a flat bed without using bedrails?: A Little Help needed moving to and from a bed to a chair (including a wheelchair)?: A Little Help needed standing up from a chair using your arms (e.g., wheelchair or bedside chair)?: A Lot Help needed to walk in hospital room?: A Little Help needed climbing 3-5 steps with a railing? : A Lot 6 Click Score: 16    End of Session Equipment Utilized During Treatment: Gait belt;Left knee immobilizer Activity Tolerance: Patient limited by fatigue Patient left: in bed;with call bell/phone within reach;with bed alarm set;with family/visitor present Nurse Communication: Mobility status PT Visit Diagnosis: Unsteadiness on feet (R26.81);Muscle weakness (generalized) (M62.81);Difficulty in walking, not elsewhere classified (R26.2);History of falling (Z91.81);Pain Pain - Right/Left: Left Pain - part of body: Knee     Time: 1610-9604 PT Time Calculation (min) (ACUTE ONLY): 33 min  Charges:    $Gait Training: 8-22 mins $Therapeutic Activity: 8-22 mins PT General Charges $$ ACUTE PT VISIT: 1 Visit                     Mauro Kaufmann PT Acute Rehabilitation Services Pager [phone] Office [phone]    Norma Anthony 08/29/2023, 4:02 PM

## 2023-08-29 NOTE — Progress Notes (Signed)
Physical Therapy Treatment Patient Details Name: Norma Anthony MRN: [id number] DOB: 02/19/1956 Today's Date: 08/29/2023   History of Present Illness Pt s/p reimplantation of L TKR and with hx of DM, anxiety, L TKR, CAD, COPD, PE and Fibromyalgia    PT Comments  Pt motivated this am and with marked improvement in mobility including decreasing level of assist for all tasks and increased distance ambulated with noted improvement in stability and min c/o dizziness.    If plan is discharge home, recommend the following: A lot of help with walking and/or transfers;A lot of help with bathing/dressing/bathroom;Assistance with cooking/housework;Assist for transportation;Help with stairs or ramp for entrance   Can travel by private vehicle        Equipment Recommendations  Rolling walker (2 wheels)    Recommendations for Other Services       Precautions / Restrictions Precautions Precautions: Fall;Knee Required Braces or Orthoses: Knee Immobilizer - Left Knee Immobilizer - Left: Discontinue once straight leg raise with < 10 degree lag Restrictions Weight Bearing Restrictions: No LLE Weight Bearing: Weight bearing as tolerated     Mobility  Bed Mobility Overal bed mobility: Needs Assistance Bed Mobility: Supine to Sit, Sit to Supine     Supine to sit: Min assist, Mod assist     General bed mobility comments: use of bedrails, assist to manage L LE    Transfers Overall transfer level: Needs assistance Equipment used: Rolling walker (2 wheels) Transfers: Sit to/from Stand Sit to Stand: Min assist, From elevated surface, Mod assist           General transfer comment: cues for LE management and use of UEs to self assist.  Physical assist to bring wt up and fwd and to balance in standing with RW;    Ambulation/Gait Ambulation/Gait assistance: Min assist, +2 safety/equipment Gait Distance (Feet): 27 Feet Assistive device: Rolling walker (2 wheels) Gait Pattern/deviations:  Step-to pattern, Decreased step length - right, Decreased step length - left, Shuffle, Trunk flexed Gait velocity: decr     General Gait Details: increased time with cues for sequence, posture and position from RW.  C/o dizziness with increased distance   Stairs             Wheelchair Mobility     Tilt Bed    Modified Rankin (Stroke Patients Only)       Balance Overall balance assessment: Needs assistance Sitting-balance support: No upper extremity supported, Feet supported Sitting balance-Leahy Scale: Good     Standing balance support: Bilateral upper extremity supported Standing balance-Leahy Scale: Poor                              Cognition Arousal: Alert Behavior During Therapy: Anxious Overall Cognitive Status: Within Functional Limits for tasks assessed                                          Exercises Total Joint Exercises Ankle Circles/Pumps: AROM, Both, 15 reps, Supine Quad Sets: AROM, Both, 10 reps, Supine Heel Slides: AAROM, Left, Supine, 20 reps Straight Leg Raises: AAROM, Left, Supine, 20 reps    General Comments        Pertinent Vitals/Pain Pain Assessment Pain Assessment: 0-10 Pain Score: 4  Pain Location: L knee Pain Descriptors / Indicators: Aching, Grimacing, Sore Pain Intervention(s): Limited activity within patient's tolerance, Monitored during  session, Premedicated before session, Ice applied    Home Living                          Prior Function            PT Goals (current goals can now be found in the care plan section) Acute Rehab PT Goals Patient Stated Goal: Regain IND and be able to access bedroom on second level PT Goal Formulation: With patient Time For Goal Achievement: 09/09/23 Potential to Achieve Goals: Fair Progress towards PT goals: Progressing toward goals    Frequency    7X/week      PT Plan      Co-evaluation              AM-PAC PT "6 Clicks"  Mobility   Outcome Measure  Help needed turning from your back to your side while in a flat bed without using bedrails?: A Little Help needed moving from lying on your back to sitting on the side of a flat bed without using bedrails?: A Little Help needed moving to and from a bed to a chair (including a wheelchair)?: A Little Help needed standing up from a chair using your arms (e.g., wheelchair or bedside chair)?: A Lot Help needed to walk in hospital room?: A Lot Help needed climbing 3-5 steps with a railing? : A Lot 6 Click Score: 15    End of Session Equipment Utilized During Treatment: Gait belt;Left knee immobilizer Activity Tolerance: Patient limited by fatigue Patient left: in bed;with call bell/phone within reach;with bed alarm set;with family/visitor present Nurse Communication: Mobility status PT Visit Diagnosis: Unsteadiness on feet (R26.81);Muscle weakness (generalized) (M62.81);Difficulty in walking, not elsewhere classified (R26.2);History of falling (Z91.81);Pain Pain - Right/Left: Left Pain - part of body: Knee     Time: 8413-2440 PT Time Calculation (min) (ACUTE ONLY): 42 min  Charges:    $Gait Training: 8-22 mins $Therapeutic Exercise: 8-22 mins $Therapeutic Activity: 8-22 mins PT General Charges $$ ACUTE PT VISIT: 1 Visit                     Mauro Kaufmann PT Acute Rehabilitation Services Pager [phone] Office [phone]    Orena Cavazos 08/29/2023, 12:41 PM

## 2023-08-29 NOTE — TOC Progression Note (Addendum)
Transition of Care [facility]) - Progression Note    Patient Details  Name: Norma Anthony MRN: [id number] Date of Birth: 1956-11-27  Transition of Care [facility]) CM/SW Contact  Catalina Gravel, [place] Phone Number: 08/29/2023, 10:03 AM  Clinical Narrative:    CSW received a call back from DME provider [employer], pt received a rollator in last year and insurance will not provide a new one.  TOC will advise pt.   Addendum: CSW advised pt. Spouse who confirmed they have the Rolator.  Expected Discharge Plan: OP Rehab Barriers to Discharge: Continued Medical Work up  Expected Discharge Plan and Services In-house Referral: Clinical Social Work     Living arrangements for the past 2 months: Single Family Home                                       Social Determinants of Health (SDOH) Interventions SDOH Screenings   Food Insecurity: No Food Insecurity (08/25/2023)  Housing: Low Risk  (08/25/2023)  Transportation Needs: No Transportation Needs (08/25/2023)  Utilities: Not At Risk (08/25/2023)  Depression (PHQ2-9): Low Risk  (04/07/2023)  Financial Resource Strain: Low Risk  (04/23/2021)   Received from [facility], [facility]  Social Connections: Unknown (04/16/2022)   Received from [facility], [facility]  Stress: Stress Concern Present (04/23/2021)   Received from [facility], [facility]  Tobacco Use: Medium Risk (08/25/2023)    Readmission Risk Interventions    08/28/2023    2:19 PM 03/11/2023    2:01 PM 11/06/2022    9:37 AM  Readmission Risk Prevention Plan  Transportation Screening Complete Complete Complete  PCP or Specialist Appt within 3-5 Days Complete  Complete  HRI or Home Care Consult Complete  Complete  Social Work Consult for Recovery Care Planning/Counseling Complete  Complete  Palliative Care Screening Not Applicable  Not Applicable  Medication Review Oceanographer) Complete Complete Complete  PCP or Specialist appointment within 3-5 days of discharge   Complete   HRI or Home Care Consult  Complete   SW Recovery Care/Counseling Consult  Complete   Palliative Care Screening  Not Applicable   Skilled Nursing Facility  Complete

## 2023-08-29 NOTE — Progress Notes (Signed)
Orthopedics Progress Note  Subjective: Patient reports pain better controlled today. Has not been up much  Objective:  Vitals:   08/29/23 0203 08/29/23 0525  BP: 122/63 (!) 104/57  Pulse: 97 95  Resp: 18 18  Temp: 98.8 F (37.1 C) 98.1 F (36.7 C)  SpO2: 90% 96%    General: Awake and alert  Musculoskeletal: Left knee dressing and wound vac intact, minimal swelling. Good quad set. Neg Homans Neurovascularly intact  Lab Results  Component Value Date   WBC 11.2 (H) 08/27/2023   HGB 8.9 (L) 08/27/2023   HCT 28.2 (L) 08/27/2023   MCV 102.2 (H) 08/27/2023   PLT 124 (L) 08/27/2023       Component Value Date/Time   NA 138 08/29/2023 0320   K 3.5 08/29/2023 0320   CL 98 08/29/2023 0320   CO2 32 08/29/2023 0320   GLUCOSE 116 (H) 08/29/2023 0320   BUN 14 08/29/2023 0320   CREATININE 0.56 08/29/2023 0320   CALCIUM 8.3 (L) 08/29/2023 0320   GFRNONAA >60 08/29/2023 0320    Lab Results  Component Value Date   INR 1.3 (H) 03/10/2023   INR 1.4 (H) 12/11/2022    Assessment/Plan: POD #4 s/p Procedure(s): Knee spacer removal TOTAL KNEE REVISION Slow to mobilize due to deconditioning. Doing better now.  Continue PT, discharge plan pending how she does, MD hoping for outpatient PT  Almedia Balls. Ranell Patrick, MD 08/29/2023 9:27 AM

## 2023-08-29 NOTE — Plan of Care (Signed)
Problem: Pain Management: Goal: Pain level will decrease with appropriate interventions Outcome: Progressing

## 2023-08-30 ENCOUNTER — Encounter (HOSPITAL_COMMUNITY): Payer: Self-pay | Admitting: Orthopedic Surgery

## 2023-08-30 LAB — AEROBIC/ANAEROBIC CULTURE W GRAM STAIN (SURGICAL/DEEP WOUND): Culture: NO GROWTH

## 2023-08-30 NOTE — Progress Notes (Signed)
    Subjective:  Patient reports pain as mild to moderate.  Denies N/V/CP/SOB/Abd pain. She reports continued improvement. She did well with PT yesterday. She has been using her incentive spirometry.   Objective:   VITALS:   Vitals:   08/29/23 1323 08/29/23 1752 08/29/23 2227 08/30/23 0601  BP: 108/70 131/69 (!) 110/54 (!) 114/52  Pulse: 97 95 97 96  Resp: 15 17 16 16   Temp: 99.5 F (37.5 C) 99 F (37.2 C) 98.8 F (37.1 C) 98.5 F (36.9 C)  TempSrc:   Oral Oral  SpO2: 98% 96% 99% 97%  Weight:      Height:        Patient lying in bed. NAD.  Neurologically intact ABD soft Neurovascular intact Sensation intact distally Intact pulses distally Dorsiflexion/Plantar flexion intact No cellulitis present Compartment soft Prevena incisional dressing C/D/I, 75 mmHg no leaks detected.  - JP drain 3cc output, hemovac 0cc output. Both drains removed today. Dry dressing placed over drain site.   Lab Results  Component Value Date   WBC 11.2 (H) 08/27/2023   HGB 8.9 (L) 08/27/2023   HCT 28.2 (L) 08/27/2023   MCV 102.2 (H) 08/27/2023   PLT 124 (L) 08/27/2023   BMET    Component Value Date/Time   NA 138 08/29/2023 0320   K 3.5 08/29/2023 0320   CL 98 08/29/2023 0320   CO2 32 08/29/2023 0320   GLUCOSE 116 (H) 08/29/2023 0320   BUN 14 08/29/2023 0320   CREATININE 0.56 08/29/2023 0320   CALCIUM 8.3 (L) 08/29/2023 0320   EGFR 101.0 04/13/2023 1511   GFRNONAA >60 08/29/2023 0320     Assessment/Plan: 5 Days Post-Op   Principal Problem:   Infected prosthetic knee joint (HCC) Active Problems:   Infection of total right knee replacement, subsequent encounter   WBAT with walker DVT ppx: Xarelto, SCDs, TEDS PO pain control PT/OT: Slow progression with PT. She ambulated 65feet x2 yesterday. Continuing to improve.  Dispo:  - Cultures negative x4 days. Continue to monitor. Continue oral keflex.  - JP drain and hemovac drains removed this morning. Dry dressing placed over  drain sites. Change drain dressing site as needed.  - Mupirocin ointment and nystatin powder daily.  - Continue Prevena incisional dressing on house vac . Upon discharge nurse to convert house vac to prevena portable vac.  - D/c pending clearance with PT.   Clois Dupes, PA-C 08/30/2023, 8:13 AM   [facility]  [place] [address], [address], [place], [place] [address] Phone: [phone] [email or web address] Facebook  [employer]

## 2023-08-30 NOTE — TOC Transition Note (Signed)
Transition of Care [facility]) - CM/SW Discharge Note   Patient Details  Name: Norma Anthony MRN: [id number] Date of Birth: 07-18-1956  Transition of Care [facility]) CM/SW Contact:  Amada Jupiter, LCSW Phone Number: 08/31/2023, 1:06 PM   Clinical Narrative:     9/17 ADDENDUM: alerted by PA that they have placed orders for HHPT/OT.  Pt would like to have [facility] again- referral placed/ accepted.  Anticipate dc home today.  Anticipating pt to dc home today with spouse.  Per TOC notes, pt is already set up with OPPT and has a rollator walker at home.  Pt has been informed that insurance will not cover another walker.  Pt and spouse aware.  No further TOC needs.  Final next level of care: Home w Home Health Services Barriers to Discharge: Barriers Resolved   Patient Goals and CMS Choice      Discharge Placement                         Discharge Plan and Services Additional resources added to the After Visit Summary for   In-house Referral: Clinical Social Work              DME Arranged: N/A DME Agency: NA       HH Arranged: PT, OT HH Agency: [facility] Date HH Agency Contacted: 08/31/23 Time HH Agency Contacted: 1303 Representative spoke with at [facility] Agency: Hassel Neth  Social Determinants of Health (SDOH) Interventions SDOH Screenings   Food Insecurity: No Food Insecurity (08/25/2023)  Housing: Low Risk  (08/25/2023)  Transportation Needs: No Transportation Needs (08/25/2023)  Utilities: Not At Risk (08/25/2023)  Depression (PHQ2-9): Low Risk  (04/07/2023)  Financial Resource Strain: Low Risk  (04/23/2021)   Received from [facility], [facility]  Social Connections: Unknown (04/16/2022)   Received from [facility], [facility]  Stress: Stress Concern Present (04/23/2021)   Received from [facility], [facility]  Tobacco Use: Medium Risk (08/25/2023)     Readmission Risk Interventions    08/28/2023    2:19 PM 03/11/2023    2:01 PM 11/06/2022     9:37 AM  Readmission Risk Prevention Plan  Transportation Screening Complete Complete Complete  PCP or Specialist Appt within 3-5 Days Complete  Complete  HRI or Home Care Consult Complete  Complete  Social Work Consult for Recovery Care Planning/Counseling Complete  Complete  Palliative Care Screening Not Applicable  Not Applicable  Medication Review Oceanographer) Complete Complete Complete  PCP or Specialist appointment within 3-5 days of discharge  Complete   HRI or Home Care Consult  Complete   SW Recovery Care/Counseling Consult  Complete   Palliative Care Screening  Not Applicable   Skilled Nursing Facility  Complete

## 2023-08-30 NOTE — Progress Notes (Signed)
Physical Therapy Treatment Patient Details Name: Norma Anthony MRN: [id number] DOB: February 12, 1956 Today's Date: 08/30/2023   History of Present Illness Patient is a 67 year old female who is s/p reimplantation of L TKR PMH: DM, anxiety, L TKR, CAD, COPD, PE and Fibromyalgia    PT Comments  POD # 5 pm session Assisted OOB to practice the stairs again.  Had Spouse "hands on" assist with all mobility this session using safety belt.   Pt has NOT yet met her mobility goals to D/C to home today as she was unsteady on the stairs and she fatigues quickly.  Pt would benefit from "one more day" and repeat stair straining again tomorrow.    If plan is discharge home, recommend the following: A lot of help with walking and/or transfers;A lot of help with bathing/dressing/bathroom;Assistance with cooking/housework;Assist for transportation;Help with stairs or ramp for entrance   Can travel by private vehicle        Equipment Recommendations  Rolling walker (2 wheels)    Recommendations for Other Services       Precautions / Restrictions Precautions Precautions: Fall;Knee Precaution Comments: no pillow under knee Required Braces or Orthoses: Knee Immobilizer - Left Knee Immobilizer - Left: On when out of bed or walking;Discontinue once straight leg raise with < 10 degree lag Restrictions Weight Bearing Restrictions: No LLE Weight Bearing: Weight bearing as tolerated     Mobility  Bed Mobility Overal bed mobility: Needs Assistance Bed Mobility: Supine to Sit, Sit to Supine     Supine to sit: Contact guard, Min assist Sit to supine: Min assist, Mod assist   General bed mobility comments: demonstarted and instructed how to use belt to asisst LE off and back onto bed.  Had Spouse "hands on" assist.    Transfers   Equipment used: Rolling walker (2 wheels) Transfers: Sit to/from Stand, Bed to chair/wheelchair/BSC Sit to Stand: Min assist           General transfer comment: pt able to  rise from elevated at Min Asisst using forward memontum and increased time.   Had Spouse "hands on" assist.    Ambulation/Gait Ambulation/Gait assistance: Contact guard assist Gait Distance (Feet): 5 Feet Assistive device: Rolling walker (2 wheels) Gait Pattern/deviations: Step-to pattern, Decreased step length - right, Decreased step length - left, Shuffle, Trunk flexed Gait velocity: decr     General Gait Details: decreased amb distance due to focus on navigating stairs in prep for D/C to home.   Stairs Stairs: Yes Stairs assistance: Min assist, Mod assist Stair Management: No rails, Step to pattern, Forwards, With walker Number of Stairs: 2 General stair comments: with Spouse present instructed on up forward with walker with posterior assist up stairs then down forward with assist in front to secure walker   Wheelchair Mobility     Tilt Bed    Modified Rankin (Stroke Patients Only)       Balance                                            Cognition Arousal: Alert Behavior During Therapy: Anxious Overall Cognitive Status: Within Functional Limits for tasks assessed                                 General Comments: AxO x 3 pleasant [facility]  and very supportive Spouse "mike"  This is her 5th knee surgery on this leg, stated pt.        Exercises      General Comments        Pertinent Vitals/Pain Pain Assessment Pain Assessment: Faces Faces Pain Scale: Hurts little more Pain Location: L knee Pain Descriptors / Indicators: Aching, Grimacing, Sore, Operative site guarding Pain Intervention(s): Monitored during session, Premedicated before session, Repositioned, Ice applied    Home Living                          Prior Function            PT Goals (current goals can now be found in the care plan section) Progress towards PT goals: Progressing toward goals    Frequency    7X/week      PT Plan       Co-evaluation              AM-PAC PT "6 Clicks" Mobility   Outcome Measure  Help needed turning from your back to your side while in a flat bed without using bedrails?: A Little Help needed moving from lying on your back to sitting on the side of a flat bed without using bedrails?: A Little Help needed moving to and from a bed to a chair (including a wheelchair)?: A Little Help needed standing up from a chair using your arms (e.g., wheelchair or bedside chair)?: A Little Help needed to walk in hospital room?: A Lot Help needed climbing 3-5 steps with a railing? : A Lot 6 Click Score: 16    End of Session Equipment Utilized During Treatment: Gait belt;Left knee immobilizer Activity Tolerance: Patient limited by fatigue Patient left: in bed;with call bell/phone within reach;with bed alarm set;with family/visitor present Nurse Communication: Mobility status PT Visit Diagnosis: Unsteadiness on feet (R26.81);Muscle weakness (generalized) (M62.81);Difficulty in walking, not elsewhere classified (R26.2);History of falling (Z91.81);Pain Pain - Right/Left: Left Pain - part of body: Knee     Time: 4098-1191 PT Time Calculation (min) (ACUTE ONLY): 24 min  Charges:    $Gait Training: 8-22 mins $Therapeutic Activity: 8-22 mins PT General Charges $$ ACUTE PT VISIT: 1 Visit                     {Amadea Keagy  PTA Acute  Rehabilitation Services Office M-F          [phone]

## 2023-08-30 NOTE — Plan of Care (Signed)
  Problem: Pain Management: Goal: Pain level will decrease with appropriate interventions Outcome: Progressing   

## 2023-08-30 NOTE — Progress Notes (Signed)
Noticed moderate amount of bleeding from the drain removal site.Changed dressing twice with ABD pads and compression wrap as directed by Denny Peon, PA. Reassessed- no bleeding from the site.   Ignacia Bayley, RN

## 2023-08-30 NOTE — Care Management Important Message (Signed)
Important Message  Patient Details IM Letter given. Name: Norma Anthony MRN: [id number] Date of Birth: Jan 11, 1956   Medicare Important Message Given:  Yes     Caren Macadam 08/30/2023, 2:11 PM

## 2023-08-30 NOTE — Progress Notes (Signed)
Physical Therapy Treatment Patient Details Name: Norma Anthony MRN: [id number] DOB: 08-13-1956 Today's Date: 08/30/2023   History of Present Illness Patient is a 67 year old female who is s/p reimplantation of L TKR PMH: DM, anxiety, L TKR, CAD, COPD, PE and Fibromyalgia    PT Comments  POD # 5 am session General Comments: AxO x 3 pleasant Lady and very supportive Spouse "mike"  This is her 5th knee surgery on this leg, stated pt. Assisted OOB required increased time.  General bed mobility comments: demonstarted and instructed how to use belt to asisst LE off and back onto bed  General transfer comment: pt able to rise from elevated at Min [facility]st using forward memontum and increased time.    Fatigues easily.  Required rest breaks between. General Gait Details: decreased amb distance due to focus on navigating stairs in prep for D/C to home.  Assisted back to bed due to MAX c/o fatigue.   Will see pt again this afternoon    If plan is discharge home, recommend the following: A lot of help with walking and/or transfers;A lot of help with bathing/dressing/bathroom;Assistance with cooking/housework;Assist for transportation;Help with stairs or ramp for entrance   Can travel by private vehicle        Equipment Recommendations  Rolling walker (2 wheels)    Recommendations for Other Services       Precautions / Restrictions Precautions Precautions: Fall;Knee Precaution Comments: no pillow under knee Required Braces or Orthoses: Knee Immobilizer - Left Knee Immobilizer - Left: On when out of bed or walking;Discontinue once straight leg raise with < 10 degree lag Restrictions Weight Bearing Restrictions: No LLE Weight Bearing: Weight bearing as tolerated     Mobility  Bed Mobility Overal bed mobility: Needs Assistance Bed Mobility: Supine to Sit, Sit to Supine     Supine to sit: Contact guard, Min assist Sit to supine: Min assist, Mod assist   General bed mobility comments:  demonstarted and instructed how to use belt to asisst LE off and back onto bed    Transfers   Equipment used: Rolling walker (2 wheels) Transfers: Sit to/from Stand, Bed to chair/wheelchair/BSC Sit to Stand: Min assist           General transfer comment: pt able to rise from elevated at [facility] using forward memontum and increased time.    Fatigues easily.  Required rest breaks between.    Ambulation/Gait Ambulation/Gait assistance: Contact guard assist Gait Distance (Feet): 5 Feet Assistive device: Rolling walker (2 wheels) Gait Pattern/deviations: Step-to pattern, Decreased step length - right, Decreased step length - left, Shuffle, Trunk flexed Gait velocity: decr     General Gait Details: decreased amb distance due to focus on navigating stairs in prep for D/C to home.   Stairs Stairs: Yes Stairs assistance: Min assist, Mod assist Stair Management: No rails, Step to pattern, Forwards, With walker Number of Stairs: 2 General stair comments: with Spouse present instructed on up forward with walker with posterior assist up stairs then down forward with assist in front to secure walker   Wheelchair Mobility     Tilt Bed    Modified Rankin (Stroke Patients Only)       Balance                                            Cognition Arousal: Alert Behavior During  Therapy: Anxious Overall Cognitive Status: Within Functional Limits for tasks assessed                                 General Comments: AxO x 3 pleasant Lady and very supportive Spouse "mike"  This is her 5th knee surgery on this leg, stated pt.        Exercises      General Comments        Pertinent Vitals/Pain Pain Assessment Pain Assessment: Faces Faces Pain Scale: Hurts little more Pain Location: L knee Pain Descriptors / Indicators: Aching, Grimacing, Sore, Operative site guarding Pain Intervention(s): Monitored during session, Premedicated before  session, Repositioned, Ice applied    Home Living                          Prior Function            PT Goals (current goals can now be found in the care plan section) Progress towards PT goals: Progressing toward goals    Frequency    7X/week      PT Plan      Co-evaluation              AM-PAC PT "6 Clicks" Mobility   Outcome Measure  Help needed turning from your back to your side while in a flat bed without using bedrails?: A Little Help needed moving from lying on your back to sitting on the side of a flat bed without using bedrails?: A Little Help needed moving to and from a bed to a chair (including a wheelchair)?: A Little Help needed standing up from a chair using your arms (e.g., wheelchair or bedside chair)?: A Little Help needed to walk in hospital room?: A Lot Help needed climbing 3-5 steps with a railing? : A Lot 6 Click Score: 16    End of Session Equipment Utilized During Treatment: Gait belt;Left knee immobilizer Activity Tolerance: Patient limited by fatigue Patient left: in bed;with call bell/phone within reach;with bed alarm set;with family/visitor present Nurse Communication: Mobility status PT Visit Diagnosis: Unsteadiness on feet (R26.81);Muscle weakness (generalized) (M62.81);Difficulty in walking, not elsewhere classified (R26.2);History of falling (Z91.81);Pain Pain - Right/Left: Left Pain - part of body: Knee     Time: 1115-1140 PT Time Calculation (min) (ACUTE ONLY): 25 min  Charges:    $Gait Training: 8-22 mins $Therapeutic Activity: 8-22 mins PT General Charges $$ ACUTE PT VISIT: 1 Visit                     Felecia Shelling  PTA Acute  Rehabilitation Services Office M-F          [phone]

## 2023-08-31 ENCOUNTER — Encounter (HOSPITAL_COMMUNITY): Payer: Self-pay | Admitting: Orthopedic Surgery

## 2023-08-31 MED ORDER — ACETAMINOPHEN 500 MG PO TABS: 1000.0000 mg | ORAL_TABLET | Freq: Three times a day (TID) | ORAL | 0 refills | Status: AC | PRN

## 2023-08-31 MED ORDER — RIVAROXABAN 10 MG PO TABS: 20.0000 mg | ORAL_TABLET | Freq: Every day | ORAL | Status: DC

## 2023-08-31 MED ORDER — CEPHALEXIN 500 MG PO CAPS: 500.0000 mg | ORAL_CAPSULE | Freq: Two times a day (BID) | ORAL | 11 refills | Status: DC

## 2023-08-31 MED ORDER — POLYETHYLENE GLYCOL 3350 17 G PO PACK: 17.0000 g | PACK | Freq: Every day | ORAL | 0 refills | Status: AC | PRN

## 2023-08-31 MED ORDER — ONDANSETRON HCL 4 MG PO TABS: 4.0000 mg | ORAL_TABLET | Freq: Three times a day (TID) | ORAL | 0 refills | Status: DC | PRN

## 2023-08-31 MED ORDER — OXYCODONE HCL 5 MG PO TABS: 5.0000 mg | ORAL_TABLET | ORAL | 0 refills | Status: DC | PRN

## 2023-08-31 MED ORDER — MUPIROCIN 2 % EX OINT: 1.0000 | TOPICAL_OINTMENT | Freq: Two times a day (BID) | CUTANEOUS | 0 refills | Status: AC

## 2023-08-31 MED ORDER — DOCUSATE SODIUM 100 MG PO CAPS: 100.0000 mg | ORAL_CAPSULE | Freq: Two times a day (BID) | ORAL | 0 refills | Status: AC

## 2023-08-31 MED ORDER — SENNA 8.6 MG PO TABS
2.0000 | ORAL_TABLET | Freq: Every day | ORAL | 0 refills | Status: AC
Start: 2023-08-31 — End: 2023-09-15

## 2023-08-31 NOTE — Progress Notes (Addendum)
Occupational Therapy Treatment Patient Details Name: Norma Anthony MRN: [id number] DOB: Jan 16, 1956 Today's Date: 08/31/2023   History of present illness Patient is a 67 year old female who is s/p reimplantation of L TKR PMH: DM, anxiety, L TKR, CAD, COPD, PE and Fibromyalgia   OT comments  Patient's husband was present during session. Patient and husband reported feeling comfortable with patients current level of assistance with ADL. Patient and husband were provided with strategies to prevent learned helplessness. Patient and husband verbalized understanding. Patient would continue to benefit from skilled OT services at this time while admitted and after d/c to address noted deficits in order to improve overall safety and independence in ADLs.         If plan is discharge home, recommend the following:  A lot of help with bathing/dressing/bathroom;Assistance with cooking/housework;Direct supervision/assist for medications management;Assist for transportation;Help with stairs or ramp for entrance;Direct supervision/assist for financial management;A little help with walking and/or transfers   Equipment Recommendations  None recommended by OT       Precautions / Restrictions Precautions Precautions: Fall;Knee Precaution Comments: no pillow under knee Required Braces or Orthoses: Knee Immobilizer - Left Knee Immobilizer - Left: On when out of bed or walking;Discontinue once straight leg raise with < 10 degree lag Restrictions Weight Bearing Restrictions: No LLE Weight Bearing: Weight bearing as tolerated       Mobility Bed Mobility Overal bed mobility: Needs Assistance Bed Mobility: Supine to Sit     Supine to sit: Supervision, HOB elevated     General bed mobility comments: with increaed time.         Balance Overall balance assessment: Needs assistance Sitting-balance support: No upper extremity supported, Feet supported Sitting balance-Leahy Scale: Good     Standing  balance support: Single extremity supported, During functional activity Standing balance-Leahy Scale: Fair         ADL either performed or assessed with clinical judgement   ADL Overall ADL's : Needs assistance/impaired       Toilet Transfer: Contact guard assist;Stand-pivot;Rolling walker (2 wheels) Toilet Transfer Details (indicate cue type and reason): to recliner in room. patinet reported not wanting to burn too much energy today reporting that she needed to pass PT to go home. Toileting- Clothing Manipulation and Hygiene: Maximal assistance;Sit to/from stand Toileting - Clothing Manipulation Details (indicate cue type and reason): simulated reaching standing task with increased time. patient uanble to reach bottom with reports that husband would help her       General ADL Comments: patient and husband wered reviewed on ADls and how to complete them at home. patient and husband reported that they had it under control.      Cognition Arousal: Alert Behavior During Therapy: WFL for tasks assessed/performed Overall Cognitive Status: Within Functional Limits for tasks assessed         General Comments: patient's husband was present during session.                   Pertinent Vitals/ Pain       Pain Assessment Pain Assessment: Faces Faces Pain Scale: Hurts a little bit Pain Location: L knee Pain Descriptors / Indicators: Discomfort, Grimacing Pain Intervention(s): Limited activity within patient's tolerance, Monitored during session         Frequency  Min 2X/week        Progress Toward Goals  OT Goals(current goals can now be found in the care plan section)  Progress towards OT goals: Progressing toward goals  Plan         AM-PAC OT "6 Clicks" Daily Activity     Outcome Measure   Help from another person eating meals?: None Help from another person taking care of personal grooming?: None Help from another person toileting, which includes using  toliet, bedpan, or urinal?: A Lot Help from another person bathing (including washing, rinsing, drying)?: A Lot Help from another person to put on and taking off regular upper body clothing?: A Little Help from another person to put on and taking off regular lower body clothing?: A Lot 6 Click Score: 17    End of Session Equipment Utilized During Treatment: Gait belt;Rolling walker (2 wheels)  OT Visit Diagnosis: Unsteadiness on feet (R26.81);Other abnormalities of gait and mobility (R26.89);Repeated falls (R29.6)   Activity Tolerance Patient tolerated treatment well   Patient Left in chair;with call bell/phone within reach;with chair alarm set;with family/visitor present   Nurse Communication Mobility status        Time: 8413-2440 OT Time Calculation (min): 18 min  Charges: OT General Charges $OT Visit: 1 Visit OT Treatments $Self Care/Home Management : 8-22 mins  Rosalio Loud, MS Acute Rehabilitation Department Office# [phone]   Selinda Flavin 08/31/2023, 11:58 AM

## 2023-08-31 NOTE — Plan of Care (Signed)
Discharge instructions given to the patient including medications and follow up. Wound vac changed into Pravena wound vac

## 2023-08-31 NOTE — Progress Notes (Signed)
    Subjective:  Patient reports pain as mild to moderate.  Denies N/V/CP/SOB/Abd pain. She reports she is doing well this morning. Pain controlled.  She is eager for d/c home.   Objective:   VITALS:   Vitals:   08/30/23 0601 08/30/23 1359 08/30/23 2322 08/31/23 0633  BP: (!) 114/52 (!) 156/64 (!) 150/76 134/64  Pulse: 96 (!) 103 (!) 104 (!) 103  Resp: 16 16 15 16   Temp: 98.5 F (36.9 C) 98 F (36.7 C) 98.3 F (36.8 C) 98.7 F (37.1 C)  TempSrc: Oral  Oral Oral  SpO2: 97% 94% 96% 98%  Weight:      Height:        Patient lying in bed. NAD.  Neurologically intact ABD soft Neurovascular intact Sensation intact distally Intact pulses distally Dorsiflexion/Plantar flexion intact No cellulitis present Compartment soft Prevena incisional dressing C/D/I, 75 mmHg no leaks detected.  Hemovac and JP drain site C/D/I.   Lab Results  Component Value Date   WBC 11.2 (H) 08/27/2023   HGB 8.9 (L) 08/27/2023   HCT 28.2 (L) 08/27/2023   MCV 102.2 (H) 08/27/2023   PLT 124 (L) 08/27/2023   BMET    Component Value Date/Time   NA 138 08/29/2023 0320   K 3.5 08/29/2023 0320   CL 98 08/29/2023 0320   CO2 32 08/29/2023 0320   GLUCOSE 116 (H) 08/29/2023 0320   BUN 14 08/29/2023 0320   CREATININE 0.56 08/29/2023 0320   CALCIUM 8.3 (L) 08/29/2023 0320   EGFR 101.0 04/13/2023 1511   GFRNONAA >60 08/29/2023 0320     Assessment/Plan: 6 Days Post-Op   Principal Problem:   Infected prosthetic knee joint (HCC) Active Problems:   Infection of total right knee replacement, subsequent encounter   WBAT with walker DVT ppx: Xarelto, SCDs, TEDS PO pain control PT/OT: Patient making progress with PT. Continue PT today.  Dispo:  - Cultures negative x4 days. Continue to monitor. Continue oral keflex.  - Mupirocin ointment and nystatin powder daily.  -  Continue Prevena incisional dressing on house vac . Upon discharge nurse to convert house vac to prevena portable vac.  -  D/c home with OPPT once cleared with PT.   Clois Dupes, PA-C 08/31/2023, 8:11 AM  [facility]  [place] [address], [address], [place], [place] [address] Phone: [phone] [email or web address] Facebook  [employer]

## 2023-08-31 NOTE — Progress Notes (Signed)
Physical Therapy Treatment Patient Details Name: Norma Anthony MRN: [id number] DOB: 27-Jan-1956 Today's Date: 08/31/2023   History of Present Illness Patient is a 67 year old female who is s/p reimplantation of L TKR PMH: DM, anxiety, L TKR, CAD, COPD, PE and Fibromyalgia    PT Comments  POD # 6 General Comments: AxO x 3 very pleasant Lady with supportive Spouse "Kathlene November".  Pt has had multiple surgeries on her L knee "hopefuly this is the last", stated pt. Pt OOB in recliner.  General transfer comment: pt able to rise from elevated at [facility] using forward memontum and increased time.   Had Spouse "hands on" assist.  VC's on safety.  Does tend to sit quickly due to fatigue. General Gait Details: decreased amb distance due to focus on navigating stairs in prep for D/C to home.  Fatigues quickly.  "I need to sit", pt repeats. General stair comments: with Spouse present instructed on up forward with walker with posterior assist up stairs then down forward with assist in front to secure walker.  Remains VERY unsteady.  Pt IS wearing her KI for increased support with knee still buckling under her weight.  Pt fatigues easily and is unable to continue.  "I need to sit".  HIGH FALL RISK for both pt and Spouse. Spouse and Pt have concerns about navigating stairs "trying to get to out patient therapy appointments".  Spouse uses a wheelchair to get her from stairs to car and vice vera, but navigating stairs continues to be even more difficult.  Pt admits to decreased activity at home since her last knee surgery. Will consult PA and ask for [facility] PT vs OP PT If HH PT can be arranged today, Pt is ready to D/C today    If plan is discharge home, recommend the following: A lot of help with walking and/or transfers;A lot of help with bathing/dressing/bathroom;Assistance with cooking/housework;Assist for transportation;Help with stairs or ramp for entrance   Can travel by private vehicle        Equipment  Recommendations  Rolling walker (2 wheels)    Recommendations for Other Services       Precautions / Restrictions Precautions Precautions: Fall;Knee Precaution Comments: no pillow under knee Required Braces or Orthoses: Knee Immobilizer - Left Knee Immobilizer - Left: On when out of bed or walking;Discontinue once straight leg raise with < 10 degree lag Restrictions Weight Bearing Restrictions: No LLE Weight Bearing: Weight bearing as tolerated     Mobility  Bed Mobility               General bed mobility comments: OOB in recliner    Transfers Overall transfer level: Needs assistance Equipment used: Rolling walker (2 wheels) Transfers: Sit to/from Stand, Bed to chair/wheelchair/BSC Sit to Stand: Min assist, Contact guard assist           General transfer comment: pt able to rise from elevated at [facility] using forward memontum and increased time.   Had Spouse "hands on" assist.  VC's on safety.  Does tend to sit quickly due to fatigue.    Ambulation/Gait Ambulation/Gait assistance: Contact guard assist Gait Distance (Feet): 7 Feet Assistive device: Rolling walker (2 wheels) Gait Pattern/deviations: Step-to pattern, Decreased step length - right, Decreased step length - left, Shuffle, Trunk flexed Gait velocity: decr     General Gait Details: decreased amb distance due to focus on navigating stairs in prep for D/C to home.  Fatigues quickly.  "I need to sit", pt  repeats.   Stairs   Stairs assistance: Min assist, Mod assist Stair Management: No rails, Step to pattern, Forwards, With walker Number of Stairs: 2 General stair comments: with Spouse present instructed on up forward with walker with posterior assist up stairs then down forward with assist in front to secure walker.  Remains VERY unsteady.  Pt IS wearing her KI for increased support with knee still buckling under her weight.  Pt fatigues easily and is unable to continue.  "I need to sit".  HIGH FALL  RISK for both pt and Spouse.   Wheelchair Mobility     Tilt Bed    Modified Rankin (Stroke Patients Only)       Balance                                            Cognition Arousal: Alert   Overall Cognitive Status: Within Functional Limits for tasks assessed                                 General Comments: AxO x 3 very pleasant Lady with supportive Spouse "Kathlene November".  Pt has had multiple surgeries on her L knee "hopefuly this is the last", stated pt.        Exercises  Total Knee Revision TE's following HEP handout 10 reps B LE ankle pumps 05 reps towel squeezes 05 reps knee presses 05 reps heel slides  05 reps SAQ's 05 reps SLR's 05 reps ABD Educated on use of gait belt to assist with TE's Followed by ICE     General Comments        Pertinent Vitals/Pain Pain Assessment Pain Assessment: 0-10 Pain Score: 5  Pain Location: L knee Pain Descriptors / Indicators: Discomfort, Grimacing, Operative site guarding Pain Intervention(s): Monitored during session, Premedicated before session, Repositioned, Ice applied    Home Living                          Prior Function            PT Goals (current goals can now be found in the care plan section) Progress towards PT goals: Progressing toward goals    Frequency    7X/week      PT Plan      Co-evaluation              AM-PAC PT "6 Clicks" Mobility   Outcome Measure  Help needed turning from your back to your side while in a flat bed without using bedrails?: A Little Help needed moving from lying on your back to sitting on the side of a flat bed without using bedrails?: A Little Help needed moving to and from a bed to a chair (including a wheelchair)?: A Little Help needed standing up from a chair using your arms (e.g., wheelchair or bedside chair)?: A Little Help needed to walk in hospital room?: A Lot Help needed climbing 3-5 steps with a railing? : A  Lot 6 Click Score: 16    End of Session Equipment Utilized During Treatment: Gait belt;Left knee immobilizer Activity Tolerance: Patient limited by fatigue Patient left: in chair;with call bell/phone within reach;with family/visitor present Nurse Communication: Mobility status PT Visit Diagnosis: Unsteadiness on feet (R26.81);Muscle weakness (generalized) (M62.81);Difficulty in walking, not elsewhere classified (  R26.2);History of falling (Z91.81);Pain Pain - Right/Left: Left Pain - part of body: Knee     Time: 1035-1110 PT Time Calculation (min) (ACUTE ONLY): 35 min  Charges:    $Gait Training: 8-22 mins $Therapeutic Exercise: 8-22 mins PT General Charges $$ ACUTE PT VISIT: 1 Visit                     Felecia Shelling  PTA Acute  Rehabilitation Services Office M-F          [phone]

## 2023-09-01 NOTE — Discharge Summary (Signed)
Physician Discharge Summary  Patient ID: Norma Anthony MRN: [id number] DOB/AGE: January 16, 1956 67 y.o.  Admit date: 08/25/2023 Discharge date: 08/31/2023  Admission Diagnoses:  Infected prosthetic knee joint [facility])  Discharge Diagnoses:  Principal Problem:   Infected prosthetic knee joint (HCC) Active Problems:   Infection of total right knee replacement, subsequent encounter   Past Medical History:  Diagnosis Date   Acute respiratory failure with hypoxia (HCC) 03/23/2022   Acute respiratory failure with hypoxia (HCC) 03/23/2022   Anemia    Anxiety    Arthritis    CAD (coronary artery disease)    pt deneies   COPD (chronic obstructive pulmonary disease) (HCC)    Depression    Dyspnea    Fibromyalgia    Generalized anxiety disorder 12/11/2022   GERD (gastroesophageal reflux disease)    Hiatal hernia    History of kidney stones    Lung nodule 03/24/2022   Migraine headache    Multinodular goiter    Nephrolithiasis    Obstructive sleep apnea syndrome 07/01/2021   Opioid dependence (HCC) 03/23/2022   Pneumonia    Pulmonary embolism (HCC) 1996   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 female who was admitted 08/25/2023 with a diagnosis of Infected prosthetic knee joint (HCC) and went to the operating room on 08/25/2023 and underwent the above named procedures.    She was given perioperative antibiotics:  Anti-infectives (From admission, onward)    Start     Dose/Rate Route Frequency Ordered Stop   08/31/23 0000  cephALEXin (KEFLEX) 500 MG capsule        500 mg Oral 2 times daily 08/31/23 1318 08/25/24 2359   08/28/23  1800  fluconazole (DIFLUCAN) tablet 150 mg        150 mg Oral  Once 08/28/23 1714 08/28/23 1811   08/27/23 1115  cephALEXin (KEFLEX) capsule 500 mg  Status:  Discontinued        500 mg Oral Every 12 hours 08/27/23 1017 08/31/23 2001   08/25/23 2200  ceFAZolin (ANCEF) IVPB 2g/100 mL premix        2 g 200 mL/hr over 30 Minutes Intravenous Every 6 hours 08/25/23 1735 08/26/23 0418   08/25/23 1257  vancomycin (VANCOCIN) 1-5 GM/200ML-% IVPB       Note to Pharmacy: Kelly Splinter A: cabinet override      08/25/23 1257 08/26/23 0114   08/25/23 0915  ceFAZolin (ANCEF) IVPB 2g/100 mL premix        2 g 200 mL/hr over 30 Minutes Intravenous On call to O.R. 08/25/23 0904 08/25/23 1628       She was given sequential compression devices, early ambulation, and xarelto for DVT prophylaxis.  POD#1 Patient had slow progression with PT, chronic deconditioning. Hemovac and JP drains intact. Prevena negative pressure incisional dressing. Gram stain negative following intraoperative cultures.  POD#2 Patient started on oral keflex. Slow progression with PT continued.  POD#3 Patient ambulated 7 feet x3 with PT. Hemovac and JP drain continued.  POD#4 Patient ambulated 27 ft with PT. Hemovac and JP drain continued.  POD#5 Hemovac and JP drains removed, prevena incisional vac continued.  Physician Discharge Summary  Patient ID: Norma Anthony MRN: [id number] DOB/AGE: January 16, 1956 67 y.o.  Admit date: 08/25/2023 Discharge date: 08/31/2023  Admission Diagnoses:  Infected prosthetic knee joint [facility])  Discharge Diagnoses:  Principal Problem:   Infected prosthetic knee joint (HCC) Active Problems:   Infection of total right knee replacement, subsequent encounter   Past Medical History:  Diagnosis Date   Acute respiratory failure with hypoxia (HCC) 03/23/2022   Acute respiratory failure with hypoxia (HCC) 03/23/2022   Anemia    Anxiety    Arthritis    CAD (coronary artery disease)    pt deneies   COPD (chronic obstructive pulmonary disease) (HCC)    Depression    Dyspnea    Fibromyalgia    Generalized anxiety disorder 12/11/2022   GERD (gastroesophageal reflux disease)    Hiatal hernia    History of kidney stones    Lung nodule 03/24/2022   Migraine headache    Multinodular goiter    Nephrolithiasis    Obstructive sleep apnea syndrome 07/01/2021   Opioid dependence (HCC) 03/23/2022   Pneumonia    Pulmonary embolism (HCC) 1996   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 female who was admitted 08/25/2023 with a diagnosis of Infected prosthetic knee joint (HCC) and went to the operating room on 08/25/2023 and underwent the above named procedures.    She was given perioperative antibiotics:  Anti-infectives (From admission, onward)    Start     Dose/Rate Route Frequency Ordered Stop   08/31/23 0000  cephALEXin (KEFLEX) 500 MG capsule        500 mg Oral 2 times daily 08/31/23 1318 08/25/24 2359   08/28/23  1800  fluconazole (DIFLUCAN) tablet 150 mg        150 mg Oral  Once 08/28/23 1714 08/28/23 1811   08/27/23 1115  cephALEXin (KEFLEX) capsule 500 mg  Status:  Discontinued        500 mg Oral Every 12 hours 08/27/23 1017 08/31/23 2001   08/25/23 2200  ceFAZolin (ANCEF) IVPB 2g/100 mL premix        2 g 200 mL/hr over 30 Minutes Intravenous Every 6 hours 08/25/23 1735 08/26/23 0418   08/25/23 1257  vancomycin (VANCOCIN) 1-5 GM/200ML-% IVPB       Note to Pharmacy: Kelly Splinter A: cabinet override      08/25/23 1257 08/26/23 0114   08/25/23 0915  ceFAZolin (ANCEF) IVPB 2g/100 mL premix        2 g 200 mL/hr over 30 Minutes Intravenous On call to O.R. 08/25/23 0904 08/25/23 1628       She was given sequential compression devices, early ambulation, and xarelto for DVT prophylaxis.  POD#1 Patient had slow progression with PT, chronic deconditioning. Hemovac and JP drains intact. Prevena negative pressure incisional dressing. Gram stain negative following intraoperative cultures.  POD#2 Patient started on oral keflex. Slow progression with PT continued.  POD#3 Patient ambulated 7 feet x3 with PT. Hemovac and JP drain continued.  POD#4 Patient ambulated 27 ft with PT. Hemovac and JP drain continued.  POD#5 Hemovac and JP drains removed, prevena incisional vac continued.  develop nausea or vomiting, please contact the office immediately for further recommendations for treatment.   ITCHING:  If you experience itching with your medications, try taking only a single pain pill, or even half a pain pill at a time.  You can also use Benadryl over the counter for itching or also to help with sleep.   TED HOSE STOCKINGS:  Use stockings on both legs until for at least 2 weeks or as directed by physician office. They may be removed at night for sleeping.  MEDICATIONS:  See your medication summary on the "After Visit Summary" that nursing will review with you.  You may have some home medications which will be placed on hold until you complete the course of blood thinner medication.  It is important for you to complete the blood thinner medication as  prescribed.  PRECAUTIONS:  If you experience chest pain or shortness of breath - call 911 immediately for transfer to the hospital emergency department.   If you develop a fever greater that 101 F, purulent drainage from wound, increased redness or drainage from wound, foul odor from the wound/dressing, or calf pain - CONTACT YOUR SURGEON.                                                   FOLLOW-UP APPOINTMENTS:  If you do not already have a post-op appointment, please call the office for an appointment to be seen by your surgeon.  Guidelines for how soon to be seen are listed in your "After Visit Summary", but are typically between 1-4 weeks after surgery.  OTHER INSTRUCTIONS:   Knee Replacement:  Do not place pillow under knee, focus on keeping the knee straight while resting. CPM instructions: 0-90 degrees, 2 hours in the morning, 2 hours in the afternoon, and 2 hours in the evening. Place foam block, curve side up under heel at all times except when in CPM or when walking.  DO NOT modify, tear, cut, or change the foam block in any way.   MAKE SURE YOU:  Understand these instructions.  Get help right away if you are not doing well or get worse.    Thank you for letting us be a part of your medical care team.  It is a privilege we respect greatly.  We Reylynn these instructions will help you stay on track for a fast and full recovery!   Diagnostic Studies: DG Knee Left Port  Result Date: 08/25/2023 CLINICAL DATA:  Arthritis of the knee EXAM: PORTABLE LEFT KNEE - 1-2 VIEW COMPARISON:  03/10/2023 FINDINGS: Status post knee replacement, single cerclage wire at the distal femur. No fracture. Gas in the soft tissues consistent with recent surgery. Drain in the distal left thigh. IMPRESSION: Status post left knee replacement. Electronically Signed   By: Jasmine Pang M.D.   On: 08/25/2023 19:39   PERIPHERAL VASCULAR CATHETERIZATION  Result Date: 08/09/2023 Images from the original result were not  included. 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 Randie Heinz, MD Procedure Performed: 1.  Ultrasound-guided cannulation right common femoral vein 2.  IVC venogram 3.  Placement of infrarenal Cook Celect IVC filter 4.  Moderate sedation with fentanyl and Versed for 7 minutes  develop nausea or vomiting, please contact the office immediately for further recommendations for treatment.   ITCHING:  If you experience itching with your medications, try taking only a single pain pill, or even half a pain pill at a time.  You can also use Benadryl over the counter for itching or also to help with sleep.   TED HOSE STOCKINGS:  Use stockings on both legs until for at least 2 weeks or as directed by physician office. They may be removed at night for sleeping.  MEDICATIONS:  See your medication summary on the "After Visit Summary" that nursing will review with you.  You may have some home medications which will be placed on hold until you complete the course of blood thinner medication.  It is important for you to complete the blood thinner medication as  prescribed.  PRECAUTIONS:  If you experience chest pain or shortness of breath - call 911 immediately for transfer to the hospital emergency department.   If you develop a fever greater that 101 F, purulent drainage from wound, increased redness or drainage from wound, foul odor from the wound/dressing, or calf pain - CONTACT YOUR SURGEON.                                                   FOLLOW-UP APPOINTMENTS:  If you do not already have a post-op appointment, please call the office for an appointment to be seen by your surgeon.  Guidelines for how soon to be seen are listed in your "After Visit Summary", but are typically between 1-4 weeks after surgery.  OTHER INSTRUCTIONS:   Knee Replacement:  Do not place pillow under knee, focus on keeping the knee straight while resting. CPM instructions: 0-90 degrees, 2 hours in the morning, 2 hours in the afternoon, and 2 hours in the evening. Place foam block, curve side up under heel at all times except when in CPM or when walking.  DO NOT modify, tear, cut, or change the foam block in any way.   MAKE SURE YOU:  Understand these instructions.  Get help right away if you are not doing well or get worse.    Thank you for letting us be a part of your medical care team.  It is a privilege we respect greatly.  We Reylynn these instructions will help you stay on track for a fast and full recovery!   Diagnostic Studies: DG Knee Left Port  Result Date: 08/25/2023 CLINICAL DATA:  Arthritis of the knee EXAM: PORTABLE LEFT KNEE - 1-2 VIEW COMPARISON:  03/10/2023 FINDINGS: Status post knee replacement, single cerclage wire at the distal femur. No fracture. Gas in the soft tissues consistent with recent surgery. Drain in the distal left thigh. IMPRESSION: Status post left knee replacement. Electronically Signed   By: Jasmine Pang M.D.   On: 08/25/2023 19:39   PERIPHERAL VASCULAR CATHETERIZATION  Result Date: 08/09/2023 Images from the original result were not  included. 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 Randie Heinz, MD Procedure Performed: 1.  Ultrasound-guided cannulation right common femoral vein 2.  IVC venogram 3.  Placement of infrarenal Cook Celect IVC filter 4.  Moderate sedation with fentanyl and Versed for 7 minutes  Physician Discharge Summary  Patient ID: Norma Anthony MRN: [id number] DOB/AGE: January 16, 1956 67 y.o.  Admit date: 08/25/2023 Discharge date: 08/31/2023  Admission Diagnoses:  Infected prosthetic knee joint [facility])  Discharge Diagnoses:  Principal Problem:   Infected prosthetic knee joint (HCC) Active Problems:   Infection of total right knee replacement, subsequent encounter   Past Medical History:  Diagnosis Date   Acute respiratory failure with hypoxia (HCC) 03/23/2022   Acute respiratory failure with hypoxia (HCC) 03/23/2022   Anemia    Anxiety    Arthritis    CAD (coronary artery disease)    pt deneies   COPD (chronic obstructive pulmonary disease) (HCC)    Depression    Dyspnea    Fibromyalgia    Generalized anxiety disorder 12/11/2022   GERD (gastroesophageal reflux disease)    Hiatal hernia    History of kidney stones    Lung nodule 03/24/2022   Migraine headache    Multinodular goiter    Nephrolithiasis    Obstructive sleep apnea syndrome 07/01/2021   Opioid dependence (HCC) 03/23/2022   Pneumonia    Pulmonary embolism (HCC) 1996   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 female who was admitted 08/25/2023 with a diagnosis of Infected prosthetic knee joint (HCC) and went to the operating room on 08/25/2023 and underwent the above named procedures.    She was given perioperative antibiotics:  Anti-infectives (From admission, onward)    Start     Dose/Rate Route Frequency Ordered Stop   08/31/23 0000  cephALEXin (KEFLEX) 500 MG capsule        500 mg Oral 2 times daily 08/31/23 1318 08/25/24 2359   08/28/23  1800  fluconazole (DIFLUCAN) tablet 150 mg        150 mg Oral  Once 08/28/23 1714 08/28/23 1811   08/27/23 1115  cephALEXin (KEFLEX) capsule 500 mg  Status:  Discontinued        500 mg Oral Every 12 hours 08/27/23 1017 08/31/23 2001   08/25/23 2200  ceFAZolin (ANCEF) IVPB 2g/100 mL premix        2 g 200 mL/hr over 30 Minutes Intravenous Every 6 hours 08/25/23 1735 08/26/23 0418   08/25/23 1257  vancomycin (VANCOCIN) 1-5 GM/200ML-% IVPB       Note to Pharmacy: Kelly Splinter A: cabinet override      08/25/23 1257 08/26/23 0114   08/25/23 0915  ceFAZolin (ANCEF) IVPB 2g/100 mL premix        2 g 200 mL/hr over 30 Minutes Intravenous On call to O.R. 08/25/23 0904 08/25/23 1628       She was given sequential compression devices, early ambulation, and xarelto for DVT prophylaxis.  POD#1 Patient had slow progression with PT, chronic deconditioning. Hemovac and JP drains intact. Prevena negative pressure incisional dressing. Gram stain negative following intraoperative cultures.  POD#2 Patient started on oral keflex. Slow progression with PT continued.  POD#3 Patient ambulated 7 feet x3 with PT. Hemovac and JP drain continued.  POD#4 Patient ambulated 27 ft with PT. Hemovac and JP drain continued.  POD#5 Hemovac and JP drains removed, prevena incisional vac continued.  develop nausea or vomiting, please contact the office immediately for further recommendations for treatment.   ITCHING:  If you experience itching with your medications, try taking only a single pain pill, or even half a pain pill at a time.  You can also use Benadryl over the counter for itching or also to help with sleep.   TED HOSE STOCKINGS:  Use stockings on both legs until for at least 2 weeks or as directed by physician office. They may be removed at night for sleeping.  MEDICATIONS:  See your medication summary on the "After Visit Summary" that nursing will review with you.  You may have some home medications which will be placed on hold until you complete the course of blood thinner medication.  It is important for you to complete the blood thinner medication as  prescribed.  PRECAUTIONS:  If you experience chest pain or shortness of breath - call 911 immediately for transfer to the hospital emergency department.   If you develop a fever greater that 101 F, purulent drainage from wound, increased redness or drainage from wound, foul odor from the wound/dressing, or calf pain - CONTACT YOUR SURGEON.                                                   FOLLOW-UP APPOINTMENTS:  If you do not already have a post-op appointment, please call the office for an appointment to be seen by your surgeon.  Guidelines for how soon to be seen are listed in your "After Visit Summary", but are typically between 1-4 weeks after surgery.  OTHER INSTRUCTIONS:   Knee Replacement:  Do not place pillow under knee, focus on keeping the knee straight while resting. CPM instructions: 0-90 degrees, 2 hours in the morning, 2 hours in the afternoon, and 2 hours in the evening. Place foam block, curve side up under heel at all times except when in CPM or when walking.  DO NOT modify, tear, cut, or change the foam block in any way.   MAKE SURE YOU:  Understand these instructions.  Get help right away if you are not doing well or get worse.    Thank you for letting us be a part of your medical care team.  It is a privilege we respect greatly.  We Reylynn these instructions will help you stay on track for a fast and full recovery!   Diagnostic Studies: DG Knee Left Port  Result Date: 08/25/2023 CLINICAL DATA:  Arthritis of the knee EXAM: PORTABLE LEFT KNEE - 1-2 VIEW COMPARISON:  03/10/2023 FINDINGS: Status post knee replacement, single cerclage wire at the distal femur. No fracture. Gas in the soft tissues consistent with recent surgery. Drain in the distal left thigh. IMPRESSION: Status post left knee replacement. Electronically Signed   By: Jasmine Pang M.D.   On: 08/25/2023 19:39   PERIPHERAL VASCULAR CATHETERIZATION  Result Date: 08/09/2023 Images from the original result were not  included. 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 Randie Heinz, MD Procedure Performed: 1.  Ultrasound-guided cannulation right common femoral vein 2.  IVC venogram 3.  Placement of infrarenal Cook Celect IVC filter 4.  Moderate sedation with fentanyl and Versed for 7 minutes  develop nausea or vomiting, please contact the office immediately for further recommendations for treatment.   ITCHING:  If you experience itching with your medications, try taking only a single pain pill, or even half a pain pill at a time.  You can also use Benadryl over the counter for itching or also to help with sleep.   TED HOSE STOCKINGS:  Use stockings on both legs until for at least 2 weeks or as directed by physician office. They may be removed at night for sleeping.  MEDICATIONS:  See your medication summary on the "After Visit Summary" that nursing will review with you.  You may have some home medications which will be placed on hold until you complete the course of blood thinner medication.  It is important for you to complete the blood thinner medication as  prescribed.  PRECAUTIONS:  If you experience chest pain or shortness of breath - call 911 immediately for transfer to the hospital emergency department.   If you develop a fever greater that 101 F, purulent drainage from wound, increased redness or drainage from wound, foul odor from the wound/dressing, or calf pain - CONTACT YOUR SURGEON.                                                   FOLLOW-UP APPOINTMENTS:  If you do not already have a post-op appointment, please call the office for an appointment to be seen by your surgeon.  Guidelines for how soon to be seen are listed in your "After Visit Summary", but are typically between 1-4 weeks after surgery.  OTHER INSTRUCTIONS:   Knee Replacement:  Do not place pillow under knee, focus on keeping the knee straight while resting. CPM instructions: 0-90 degrees, 2 hours in the morning, 2 hours in the afternoon, and 2 hours in the evening. Place foam block, curve side up under heel at all times except when in CPM or when walking.  DO NOT modify, tear, cut, or change the foam block in any way.   MAKE SURE YOU:  Understand these instructions.  Get help right away if you are not doing well or get worse.    Thank you for letting us be a part of your medical care team.  It is a privilege we respect greatly.  We Reylynn these instructions will help you stay on track for a fast and full recovery!   Diagnostic Studies: DG Knee Left Port  Result Date: 08/25/2023 CLINICAL DATA:  Arthritis of the knee EXAM: PORTABLE LEFT KNEE - 1-2 VIEW COMPARISON:  03/10/2023 FINDINGS: Status post knee replacement, single cerclage wire at the distal femur. No fracture. Gas in the soft tissues consistent with recent surgery. Drain in the distal left thigh. IMPRESSION: Status post left knee replacement. Electronically Signed   By: Jasmine Pang M.D.   On: 08/25/2023 19:39   PERIPHERAL VASCULAR CATHETERIZATION  Result Date: 08/09/2023 Images from the original result were not  included. 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 Randie Heinz, MD Procedure Performed: 1.  Ultrasound-guided cannulation right common femoral vein 2.  IVC venogram 3.  Placement of infrarenal Cook Celect IVC filter 4.  Moderate sedation with fentanyl and Versed for 7 minutes

## 2023-09-04 ENCOUNTER — Encounter (HOSPITAL_COMMUNITY): Payer: Self-pay

## 2023-09-04 ENCOUNTER — Emergency Department (HOSPITAL_COMMUNITY): Payer: Medicare Other

## 2023-09-04 ENCOUNTER — Emergency Department (HOSPITAL_COMMUNITY)
Admission: EM | Admit: 2023-09-04 | Discharge: 2023-09-04 | Disposition: A | Discharge status: Home / Self Care | Payer: Medicare Other | Attending: Emergency Medicine | Admitting: Emergency Medicine

## 2023-09-04 DIAGNOSIS — Z96652 Presence of left artificial knee joint: Secondary | ICD-10-CM | POA: Insufficient documentation

## 2023-09-04 DIAGNOSIS — M96830 Postprocedural hemorrhage and hematoma of a musculoskeletal structure following a musculoskeletal system procedure: Secondary | ICD-10-CM | POA: Diagnosis present

## 2023-09-04 DIAGNOSIS — Z7901 Long term (current) use of anticoagulants: Secondary | ICD-10-CM | POA: Diagnosis not present

## 2023-09-04 DIAGNOSIS — R079 Chest pain, unspecified: Secondary | ICD-10-CM

## 2023-09-04 DIAGNOSIS — R0789 Other chest pain: Secondary | ICD-10-CM | POA: Insufficient documentation

## 2023-09-04 DIAGNOSIS — R791 Abnormal coagulation profile: Secondary | ICD-10-CM | POA: Insufficient documentation

## 2023-09-04 DIAGNOSIS — S71002A Unspecified open wound, left hip, initial encounter: Secondary | ICD-10-CM

## 2023-09-04 LAB — COMPREHENSIVE METABOLIC PANEL
ALT: 16 U/L (ref 0–44)
AST: 24 U/L (ref 15–41)
Albumin: 2.8 g/dL — ABNORMAL LOW (ref 3.5–5.0)
Alkaline Phosphatase: 67 U/L (ref 38–126)
Anion gap: 7 (ref 5–15)
BUN: 19 mg/dL (ref 8–23)
CO2: 31 mmol/L (ref 22–32)
Calcium: 8.4 mg/dL — ABNORMAL LOW (ref 8.9–10.3)
Chloride: 99 mmol/L (ref 98–111)
Creatinine, Ser: 0.67 mg/dL (ref 0.44–1.00)
GFR, Estimated: >60 mL/min (ref >60)
Glucose, Bld: 108 mg/dL — ABNORMAL HIGH (ref 70–99)
Potassium: 3.3 mmol/L — ABNORMAL LOW (ref 3.5–5.1)
Sodium: 137 mmol/L (ref 135–145)
Total Bilirubin: 0.7 mg/dL (ref 0.3–1.2)
Total Protein: 6 g/dL — ABNORMAL LOW (ref 6.5–8.1)

## 2023-09-04 LAB — CBC WITH DIFFERENTIAL/PLATELET
Abs Immature Granulocytes: 0.04 10*3/uL (ref 0.00–0.07)
Basophils Absolute: 0 10*3/uL (ref 0.0–0.1)
Basophils Relative: 1 %
Eosinophils Absolute: 0.3 10*3/uL (ref 0.0–0.5)
Eosinophils Relative: 4 %
HCT: 28.1 % — ABNORMAL LOW (ref 36.0–46.0)
Hemoglobin: 8.3 g/dL — ABNORMAL LOW (ref 12.0–15.0)
Immature Granulocytes: 1 %
Lymphocytes Relative: 24 %
Lymphs Abs: 1.5 10*3/uL (ref 0.7–4.0)
MCH: 30.4 pg (ref 26.0–34.0)
MCHC: 29.5 g/dL — ABNORMAL LOW (ref 30.0–36.0)
MCV: 102.9 fL — ABNORMAL HIGH (ref 80.0–100.0)
Monocytes Absolute: 0.6 10*3/uL (ref 0.1–1.0)
Monocytes Relative: 10 %
Neutro Abs: 3.6 10*3/uL (ref 1.7–7.7)
Neutrophils Relative %: 60 %
Platelets: 238 10*3/uL (ref 150–400)
RBC: 2.73 MIL/uL — ABNORMAL LOW (ref 3.87–5.11)
RDW: 16.6 % — ABNORMAL HIGH (ref 11.5–15.5)
WBC: 6 10*3/uL (ref 4.0–10.5)
nRBC: 0.8 % — ABNORMAL HIGH (ref 0.0–0.2)

## 2023-09-04 LAB — TROPONIN I (HIGH SENSITIVITY)
Troponin I (High Sensitivity): 2 ng/L (ref <18)
Troponin I (High Sensitivity): 3 ng/L (ref <18)

## 2023-09-04 LAB — D-DIMER, QUANTITATIVE: D-Dimer, Quant: 2.68 ug/mL-FEU — ABNORMAL HIGH (ref 0.00–0.50)

## 2023-09-04 MED ORDER — IOHEXOL 350 MG/ML SOLN
100.0000 mL | Freq: Once | INTRAVENOUS | Status: AC | PRN
  Administered 2023-09-04: 75 mL via INTRAVENOUS

## 2023-09-04 MED ORDER — LIDOCAINE-EPINEPHRINE 1 %-1:100000 IJ SOLN: 5.0000 mL | Freq: Once | INTRAMUSCULAR | Status: DC

## 2023-09-04 MED ORDER — OXYCODONE-ACETAMINOPHEN 5-325 MG PO TABS
1.0000 | ORAL_TABLET | Freq: Once | ORAL | Status: AC
  Administered 2023-09-04: 1 via ORAL
  Filled 2023-09-04: qty 1

## 2023-09-04 MED ORDER — LIDOCAINE-EPINEPHRINE (PF) 1 %-1:200000 IJ SOLN
5.0000 mL | Freq: Once | INTRAMUSCULAR | Status: AC
  Administered 2023-09-04: 1 mL
  Filled 2023-09-04: qty 30

## 2023-09-04 NOTE — ED Notes (Signed)
Pt has a internal stitch that came out and bleeding was not controlled. EDP at bedside. Bleeding was controlled by EDP, PA, no swelling noted at site. Pt leg is tender.

## 2023-09-04 NOTE — ED Provider Notes (Signed)
[place] EMERGENCY DEPARTMENT AT [facility] Provider Note   CSN: [id number] Arrival date & time: 09/04/23  1257     History  Chief Complaint  Patient presents with   Post-op Problem   Wound Check    Norma Anthony is a 67 y.o. female, history of infected prosthetic knee, who presents to the ED secondary to bleeding from her surgical site, from a postop surgery on 9/11.  She is notes that she had a resection of her left knee, and a new reimplantation of the left knee total arthroplasty on 9/11 by Dr. Linna Caprice.  States she was working with physical therapy today, when she noticed that her left leg was bleeding.  Has noticed that one of her stitches popped out.  She has been compliant with her Xarelto.  Denies any lightheadedness or dizziness  She also states that she started having chest pain last night, that was stabbing in the middle of her chest, and having little bit of shortness of breath.  She is concerned that she may have pneumonia.  Has been compliant with her Xarelto.  Home Medications Prior to Admission medications   Medication Sig Start Date End Date Taking? Authorizing Provider  acetaminophen (TYLENOL) 500 MG tablet Take 2 tablets (1,000 mg total) by mouth every 8 (eight) hours as needed for mild pain, moderate pain, fever or headache. 08/31/23 09/30/23  Clois Dupes, PA-C  albuterol (VENTOLIN HFA) 108 (90 Base) MCG/ACT inhaler Inhale 2 puffs into the lungs every 6 (six) hours as needed for wheezing or shortness of breath. 05/12/21   [provider]  ALPRAZolam Prudy Feeler) 1 MG tablet Take 1 tablet (1 mg total) by mouth every 6 (six) hours as needed for anxiety or sleep. 03/16/23   Cassandria Anger, PA-C  ascorbic acid (VITAMIN C) 500 MG tablet Take 500 mg by mouth daily.    [provider]  b complex vitamins capsule Take 1 capsule by mouth daily.    [provider]  cephALEXin (KEFLEX) 500 MG capsule Take 1 capsule (500 mg total) by mouth 2 (two)  times daily. 08/31/23 08/25/24  Clois Dupes, PA-C  cholecalciferol (VITAMIN D3) 25 MCG (1000 UNIT) tablet Take 1,000 Units by mouth daily.    [provider]  clotrimazole-betamethasone (LOTRISONE) cream Apply 1 Application topically 2 (two) times daily as needed (for irritation under the breasts or abdominal folds).    [provider]  diphenhydrAMINE (BENADRYL) 25 MG tablet Take 50-75 mg by mouth every 6 (six) hours as needed for allergies.    [provider]  docusate sodium (COLACE) 100 MG capsule Take 1 capsule (100 mg total) by mouth 2 (two) times daily. 08/31/23 09/30/23  Clois Dupes, PA-C  DULoxetine (CYMBALTA) 60 MG capsule Take 60 mg by mouth at bedtime. 09/14/18   [provider]  famotidine (PEPCID) 20 MG tablet TAKE 1 TABLET BY MOUTH AFTER SUPPER Patient taking differently: Take 20 mg by mouth at bedtime. 04/01/23   [name], MD  Ferrous Fumarate-Folic Acid 324-1 MG TABS Take 1 tablet by mouth daily. Hematinic 02/29/20   [provider]  gabapentin (NEURONTIN) 400 MG capsule Take 400 mg by mouth 2 (two) times daily. 08/21/22   [provider]  mupirocin ointment (BACTROBAN) 2 % Place 1 Application into the nose 2 (two) times daily for 5 days. 08/31/23 09/05/23  Clois Dupes, PA-C  naloxone [facility]) nasal spray 4 mg/0.1 mL Place 1 spray into the nose once as  needed (accidental over dose). 10/16/21   [provider]  nutrition supplement, JUVEN, (JUVEN) PACK Take 1 packet by mouth 2 (two) times daily between meals.    [provider]  ondansetron (ZOFRAN) 4 MG tablet Take 1 tablet (4 mg total) by mouth every 8 (eight) hours as needed for nausea or vomiting. 08/31/23 08/30/24  Clois Dupes, PA-C  oxyCODONE (ROXICODONE) 5 MG immediate release tablet Take 1 tablet (5 mg total) by mouth every 4 (four) hours as needed for severe pain. 08/31/23   Hill, Alain Honey, PA-C  pantoprazole (PROTONIX) 40 MG tablet Take 40 mg by mouth daily  with breakfast. 06/30/16   [provider]  polyethylene glycol (MIRALAX) 17 g packet Take 17 g by mouth daily as needed for mild constipation or moderate constipation. 08/31/23 09/30/23  Clois Dupes, PA-C  promethazine (PHENERGAN) 25 MG tablet Take 25 mg by mouth every 6 (six) hours as needed for nausea or vomiting.    [provider]  senna (SENOKOT) 8.6 MG TABS tablet Take 2 tablets (17.2 mg total) by mouth at bedtime for 15 days. 08/31/23 09/15/23  Clois Dupes, PA-C  tiZANidine (ZANAFLEX) 2 MG tablet Take 2 mg by mouth at bedtime. May take 2 additional doses during the day if needed for muscle spasms 03/31/23   [provider]  XARELTO 20 MG TABS tablet Take 20 mg by mouth at bedtime. 08/21/21   [provider]  zinc gluconate 50 MG tablet Take 50 mg by mouth daily.    [provider]      Allergies    Lyrica [pregabalin], Belbuca [buprenorphine hcl], Compazine [prochlorperazine], Droperidol, and Ergotamine-caffeine    Review of Systems   Review of Systems  Respiratory:  Positive for shortness of breath.   Cardiovascular:  Positive for chest pain. Negative for leg swelling.    Physical Exam Updated Vital Signs BP (!) 140/71   Pulse 94   Temp 99.3 F (37.4 C) (Oral)   Resp 18   Ht 5\' 6"  (1.676 m)   Wt 117.9 kg   SpO2 91%   BMI 41.97 kg/m  Physical Exam Vitals and nursing note reviewed.  Constitutional:      General: She is not in acute distress.    Appearance: She is well-developed.  HENT:     Head: Normocephalic and atraumatic.  Eyes:     Conjunctiva/sclera: Conjunctivae normal.  Cardiovascular:     Rate and Rhythm: Normal rate and regular rhythm.     Heart sounds: No murmur heard. Pulmonary:     Effort: Pulmonary effort is normal. No respiratory distress.     Breath sounds: Normal breath sounds.  Abdominal:     Palpations: Abdomen is soft.     Tenderness: There is no abdominal tenderness.  Musculoskeletal:        General:  No swelling.     Cervical back: Neck supple.  Skin:    General: Skin is warm and dry.     Capillary Refill: Capillary refill takes less than 2 seconds.     Comments: Oozing area on the left thigh, with 1 stitch missing out of 1 cm incision.  Diffuse bruising of left lower extremity.  Hemovac in place  Neurological:     Mental Status: She is alert.  Psychiatric:        Mood and Affect: Mood normal.     ED Results / Procedures / Treatments   Labs (all labs ordered are listed, but only  abnormal results are displayed) Labs Reviewed  CBC WITH DIFFERENTIAL/PLATELET - Abnormal; Notable for the following components:      Result Value   RBC 2.73 (*)    Hemoglobin 8.3 (*)    HCT 28.1 (*)    MCV 102.9 (*)    MCHC 29.5 (*)    RDW 16.6 (*)    nRBC 0.8 (*)    All other components within normal limits  COMPREHENSIVE METABOLIC PANEL - Abnormal; Notable for the following components:   Potassium 3.3 (*)    Glucose, Bld 108 (*)    Calcium 8.4 (*)    Total Protein 6.0 (*)    Albumin 2.8 (*)    All other components within normal limits  D-DIMER, QUANTITATIVE - Abnormal; Notable for the following components:   D-Dimer, Quant 2.68 (*)    All other components within normal limits  TROPONIN I (HIGH SENSITIVITY)  TROPONIN I (HIGH SENSITIVITY)    EKG None  Radiology CT Angio Chest PE W and/or Wo Contrast  Result Date: 09/04/2023 CLINICAL DATA:  Status post left knee arthroplasty on 08/25/2023 with elevated D-dimer EXAM: CT ANGIOGRAPHY CHEST WITH CONTRAST TECHNIQUE: Multidetector CT imaging of the chest was performed using the standard protocol during bolus administration of intravenous contrast. Multiplanar CT image reconstructions and MIPs were obtained to evaluate the vascular anatomy. RADIATION DOSE REDUCTION: This exam was performed according to the departmental dose-optimization program which includes automated exposure control, adjustment of the mA and/or kV according to patient size and/or  use of iterative reconstruction technique. CONTRAST:  75mL OMNIPAQUE IOHEXOL 350 MG/ML SOLN COMPARISON:  CT chest dated 12/11/2022, 03/23/2022 FINDINGS: Cardiovascular: The study is adequate for the evaluation of pulmonary embolism. There are no filling defects in the central, lobar, segmental or subsegmental pulmonary artery branches to suggest acute pulmonary embolism. Great vessels are normal in course and caliber. Normal heart size. No significant pericardial fluid/thickening. Aortic atherosclerosis. Mediastinum/Nodes: Imaged thyroid gland without nodules meeting criteria for imaging follow-up by size. Moderate hiatal hernia. Postsurgical changes of the diaphragmatic hiatus. No pathologically enlarged axillary, supraclavicular, mediastinal, or hilar lymph nodes. Lungs/Pleura: The central airways are patent. 4 mm perifissural left upper lobe nodule (6:30), unchanged from 03/23/2022. Increased band-like subpleural left lower lobe atelectasis/scarring. No pneumothorax. Trace left pleural effusion. Upper abdomen: Partially imaged anterior abdominal mesh hernia repair. Diffuse parenchymal hypoattenuation can be seen with hepatic steatosis. Musculoskeletal: No acute or abnormal lytic or blastic osseous lesions. Multilevel degenerative changes of the thoracic spine. Review of the MIP images confirms the above findings. IMPRESSION: 1. No evidence of pulmonary embolism. 2. Trace left pleural effusion. Increased band-like subpleural left lower lobe atelectasis/scarring. 3. Moderate hiatal hernia. 4. Hepatic steatosis. 5.  Aortic Atherosclerosis (ICD10-I70.0). Electronically Signed   By: Agustin Cree M.D.   On: 09/04/2023 18:01   DG Chest Port 1 View  Result Date: 09/04/2023 CLINICAL DATA:  [id number] Chest pain [id number] EXAM: PORTABLE CHEST 1 VIEW COMPARISON:  August 04, 2023 FINDINGS: Evaluation is limited by rotation. The cardiomediastinal silhouette is unchanged in contour. No pleural effusion. No pneumothorax. Scattered  linear opacities most consistent with atelectasis. No acute pleuroparenchymal abnormality. IMPRESSION: Scattered linear opacities most consistent with atelectasis. Electronically Signed   By: Meda Klinefelter M.D.   On: 09/04/2023 15:02    Procedures Wound repair  Date/Time: 09/04/2023 6:31 PM  Performed by: Pete Pelt, PA Authorized by: Pete Pelt, PA  Consent: Verbal consent obtained. Consent given by: patient Patient understanding: patient states understanding of the  procedure being performed Patient consent: the patient's understanding of the procedure matches consent given Required items: required blood products, implants, devices, and special equipment available Patient identity confirmed: verbally with patient Time out: Immediately prior to procedure a "time out" was called to verify the correct patient, procedure, equipment, support staff and site/side marked as required. Local anesthesia used: no  Anesthesia: Local anesthesia used: no  Sedation: Patient sedated: no  Comments: Patient had a Kassidee Narciso amount of lidocaine with epinephrine, injected in the wound site, to help stop the bleeding, and then quick clot, was placed in the wound, using a syringe.  There was good hemostasis, and a pressure dressing was applied       Medications Ordered in ED Medications  oxyCODONE-acetaminophen (PERCOCET/ROXICET) 5-325 MG per tablet 1 tablet (1 tablet Oral Given 09/04/23 1343)  lidocaine-EPINEPHrine (PF) (XYLOCAINE-EPINEPHrine) 1 %-1:200000 (PF) injection 5 mL (1 mL Infiltration Given 09/04/23 1400)  iohexol (OMNIPAQUE) 350 MG/ML injection 100 mL (75 mLs Intravenous Contrast Given 09/04/23 1712)  oxyCODONE-acetaminophen (PERCOCET/ROXICET) 5-325 MG per tablet 1 tablet (1 tablet Oral Given 09/04/23 1744)    ED Course/ Medical Decision Making/ A&P                                 Medical Decision Making Patient is a 67 year old female, here for wound, that has been bleeding for  the last day.  It appears to be oozing, persistently, I cleansed the area with chlorhexidine, and injected about 2 mL of lidocaine with epi, to help the vein to stop from bleeding.  Then I used quick clot, help it stop bleeding, good hemostasis was obtained, and a pressure dressing was placed.  Patient had good pulse, after applying pressure dressing.  The area was not spurting, thus I think it was likely venous in nature.  After providing this service, patient complained of some chest pain, it has been going on since yesterday.  Will obtain D-dimer, troponin, chest x-ray, CBC, CMP for further evaluation  Amount and/or Complexity of Data Reviewed Labs: ordered.    Details: Troponin flat, D-dimer elevated, hemoglobin 8.2 Radiology: ordered.    Details: CTA PE study showed no evidence of PE Discussion of management or test interpretation with external provider(s): Discussed with patient, lungs are clear to auscultation, CTA PE study was negative, troponin flat.  Heart score of 5, will have patient follow-up with her primary care doctor.  Chest pain may be secondary to anxiety, will need further evaluation.  Additionally wound had good hemostasis, strong pulse.  Tolerating dressing well.  Will have her follow-up with her surgeon.  We discussed risk of infection, rebleed, and she voiced understanding.  Tolerated procedure well  Risk Prescription drug management.    Final Clinical Impression(s) / ED Diagnoses Final diagnoses:  Chest pain, unspecified type  Bleeding from left hip wound, initial encounter    Rx / DC Orders ED Discharge Orders     None         Hailey Stormer, [place], PA 09/04/23 1837    Vanetta Mulders, MD 09/05/23 0800

## 2023-09-04 NOTE — ED Notes (Signed)
ED Provider at bedside. 

## 2023-09-04 NOTE — ED Notes (Signed)
Pressure dressing applied to wound to help control the wound. Bleeding controlled.

## 2023-09-04 NOTE — ED Notes (Addendum)
Pt is  obese and is immobile. Pt has a wound vac on left leg. Pt cannot get up, she requested a purewick. Pt also cannot get on bedpan with out making a mess and making it into the bedpan.

## 2023-09-04 NOTE — ED Triage Notes (Addendum)
Per EMS, Pt, from home, presents w/ bleeding from surgical site.  Pt had L knee surgery on 9/11.  Area looks like a suture has pulled loose.  Bleeding seems to be coming from an area inside leg.  Pt takes Xarelto.

## 2023-09-04 NOTE — Discharge Instructions (Addendum)
Please follow-up with your primary care doctor, and your surgeon.  It is very important that you discussed with your surgeon, that one of your sutures came loose, and you had to go to the ER.  Your wound has been given some medicine, to help with the bleeding.  Please keep the pressure dressing on for the first 24 hours, unless you cannot feel your foot, or your foot goes numb, or you have no pulse.  This will help stop the bleeding.  I am suspicious that you had a vein that was torn, and that is why you are bleeding.  Return to ER if you have new redness, swelling, pain or excessive bleeding from this wound.  Additionally your chest pain, cause is unclear.  You do not have a blood clot in your lungs, and your heart enzymes are reassuring.  Please follow-up with your PCP for further evaluation

## 2023-09-15 NOTE — ED Provider Notes (Signed)
Wound repair  Date/Time: 09/15/2023 4:18 PM  Performed by: Pete Pelt, PA Authorized by: Pete Pelt, PA  Consent: Verbal consent obtained. Risks and benefits: risks, benefits and alternatives were discussed Consent given by: patient Patient understanding: patient states understanding of the procedure being performed Preparation: Patient was prepped and draped in the usual sterile fashion. Local anesthesia used: yes Anesthesia: local infiltration  Anesthesia: Local anesthesia used: yes Local Anesthetic: lidocaine 1% with epinephrine Anesthetic total: 3 mL  Sedation: Patient sedated: no  Patient tolerance: patient tolerated the procedure well with no immediate complications Comments: Patient bleeding from surgical site.  Injected 3 mL of lidocaine with epi, to help with bleeding, it was not sufficient thus I put quick clot, in the area and there was good hemostasis.  Pressure dressing applied.       Pete Pelt, [place] 09/15/23 1619    Vanetta Mulders, MD 09/16/23 [phone]

## 2023-11-05 ENCOUNTER — Other Ambulatory Visit: Payer: Self-pay

## 2023-11-05 ENCOUNTER — Emergency Department (HOSPITAL_COMMUNITY)
Admission: EM | Admit: 2023-11-05 | Discharge: 2023-11-05 | Disposition: A | Discharge status: Home / Self Care | Payer: Medicare Other | Attending: Emergency Medicine | Admitting: Emergency Medicine

## 2023-11-05 ENCOUNTER — Encounter (HOSPITAL_COMMUNITY): Payer: Self-pay

## 2023-11-05 ENCOUNTER — Emergency Department (HOSPITAL_COMMUNITY): Payer: Medicare Other

## 2023-11-05 DIAGNOSIS — R319 Hematuria, unspecified: Secondary | ICD-10-CM

## 2023-11-05 DIAGNOSIS — N200 Calculus of kidney: Secondary | ICD-10-CM | POA: Insufficient documentation

## 2023-11-05 LAB — CBC WITH DIFFERENTIAL/PLATELET
Abs Immature Granulocytes: 0.02 10*3/uL (ref 0.00–0.07)
Basophils Absolute: 0 10*3/uL (ref 0.0–0.1)
Basophils Relative: 0 %
Eosinophils Absolute: 0.2 10*3/uL (ref 0.0–0.5)
Eosinophils Relative: 2 %
HCT: 43.3 % (ref 36.0–46.0)
Hemoglobin: 13.2 g/dL (ref 12.0–15.0)
Immature Granulocytes: 0 %
Lymphocytes Relative: 30 %
Lymphs Abs: 2.3 10*3/uL (ref 0.7–4.0)
MCH: 29.3 pg (ref 26.0–34.0)
MCHC: 30.5 g/dL (ref 30.0–36.0)
MCV: 96.2 fL (ref 80.0–100.0)
Monocytes Absolute: 0.6 10*3/uL (ref 0.1–1.0)
Monocytes Relative: 8 %
Neutro Abs: 4.4 10*3/uL (ref 1.7–7.7)
Neutrophils Relative %: 60 %
Platelets: 175 10*3/uL (ref 150–400)
RBC: 4.5 MIL/uL (ref 3.87–5.11)
RDW: 14.6 % (ref 11.5–15.5)
WBC: 7.5 10*3/uL (ref 4.0–10.5)
nRBC: 0 % (ref 0.0–0.2)

## 2023-11-05 LAB — URINALYSIS, ROUTINE W REFLEX MICROSCOPIC
Bacteria, UA: NONE SEEN
Bilirubin Urine: NEGATIVE
Glucose, UA: NEGATIVE mg/dL
Ketones, ur: NEGATIVE mg/dL
Leukocytes,Ua: NEGATIVE
Nitrite: NEGATIVE
Protein, ur: 30 mg/dL — AB
RBC / HPF: >50 RBC/hpf (ref 0–5)
Specific Gravity, Urine: 1.028 (ref 1.005–1.030)
pH: 5 (ref 5.0–8.0)

## 2023-11-05 LAB — COMPREHENSIVE METABOLIC PANEL
ALT: 21 U/L (ref 0–44)
AST: 25 U/L (ref 15–41)
Albumin: 3.3 g/dL — ABNORMAL LOW (ref 3.5–5.0)
Alkaline Phosphatase: 102 U/L (ref 38–126)
Anion gap: 6 (ref 5–15)
BUN: 11 mg/dL (ref 8–23)
CO2: 26 mmol/L (ref 22–32)
Calcium: 8.9 mg/dL (ref 8.9–10.3)
Chloride: 106 mmol/L (ref 98–111)
Creatinine, Ser: 0.69 mg/dL (ref 0.44–1.00)
GFR, Estimated: >60 mL/min (ref >60)
Glucose, Bld: 94 mg/dL (ref 70–99)
Potassium: 3.6 mmol/L (ref 3.5–5.1)
Sodium: 138 mmol/L (ref 135–145)
Total Bilirubin: 0.7 mg/dL (ref <1.2)
Total Protein: 7 g/dL (ref 6.5–8.1)

## 2023-11-05 MED ORDER — ONDANSETRON 4 MG PO TBDP
4.0000 mg | ORAL_TABLET | Freq: Once | ORAL | Status: AC
  Administered 2023-11-05: 4 mg via ORAL
  Filled 2023-11-05: qty 1

## 2023-11-05 MED ORDER — HYDROMORPHONE HCL 1 MG/ML IJ SOLN
1.0000 mg | Freq: Once | INTRAMUSCULAR | Status: AC
  Administered 2023-11-05: 1 mg via INTRAMUSCULAR
  Filled 2023-11-05: qty 1

## 2023-11-05 MED ORDER — TAMSULOSIN HCL 0.4 MG PO CAPS: 0.4000 mg | ORAL_CAPSULE | Freq: Every day | ORAL | 0 refills | Status: DC

## 2023-11-05 NOTE — ED Provider Notes (Signed)
[place] EMERGENCY DEPARTMENT AT [facility] Provider Note   CSN: [id number] Arrival date & time: 11/05/23  1055     History {Add pertinent medical, surgical, social history, OB history to HPI:1} Chief Complaint  Patient presents with   Hematuria    Norma Anthony is a 67 y.o. female with past medical history of fibromyalgia, GERD, CAD, COPD, kidney stone, PE (on xarelto), TKA presents to ED for evaluation of hematuria and right flank pain. She reports that she has had some clots in her urine. She denies fever at home, abdominal pain.  Hematuria Pertinent negatives include no chest pain, no abdominal pain, no headaches and no shortness of breath.      Home Medications Prior to Admission medications   Medication Sig Start Date End Date Taking? Authorizing Provider  tamsulosin (FLOMAX) 0.4 MG CAPS capsule Take 1 capsule (0.4 mg total) by mouth daily. 11/05/23  Yes Judithann Sheen, PA  albuterol (VENTOLIN HFA) 108 (90 Base) MCG/ACT inhaler Inhale 2 puffs into the lungs every 6 (six) hours as needed for wheezing or shortness of breath. 05/12/21   [provider]  ALPRAZolam Prudy Feeler) 1 MG tablet Take 1 tablet (1 mg total) by mouth every 6 (six) hours as needed for anxiety or sleep. 03/16/23   Cassandria Anger, PA-C  ascorbic acid (VITAMIN C) 500 MG tablet Take 500 mg by mouth daily.    [provider]  b complex vitamins capsule Take 1 capsule by mouth daily.    [provider]  cephALEXin (KEFLEX) 500 MG capsule Take 1 capsule (500 mg total) by mouth 2 (two) times daily. 08/31/23 08/25/24  Clois Dupes, PA-C  cholecalciferol (VITAMIN D3) 25 MCG (1000 UNIT) tablet Take 1,000 Units by mouth daily.    [provider]  clotrimazole-betamethasone (LOTRISONE) cream Apply 1 Application topically 2 (two) times daily as needed (for irritation under the breasts or abdominal folds).    [provider]  diphenhydrAMINE (BENADRYL) 25 MG tablet Take  50-75 mg by mouth every 6 (six) hours as needed for allergies.    [provider]  DULoxetine (CYMBALTA) 60 MG capsule Take 60 mg by mouth at bedtime. 09/14/18   [provider]  famotidine (PEPCID) 20 MG tablet TAKE 1 TABLET BY MOUTH AFTER SUPPER Patient taking differently: Take 20 mg by mouth at bedtime. 04/01/23   [name], MD  Ferrous Fumarate-Folic Acid 324-1 MG TABS Take 1 tablet by mouth daily. Hematinic 02/29/20   [provider]  gabapentin (NEURONTIN) 400 MG capsule Take 400 mg by mouth 2 (two) times daily. 08/21/22   [provider]  naloxone [facility]) nasal spray 4 mg/0.1 mL Place 1 spray into the nose once as needed (accidental over dose). 10/16/21   [provider]  nutrition supplement, JUVEN, (JUVEN) PACK Take 1 packet by mouth 2 (two) times daily between meals.    [provider]  ondansetron (ZOFRAN) 4 MG tablet Take 1 tablet (4 mg total) by mouth every 8 (eight) hours as needed for nausea or vomiting. 08/31/23 08/30/24  Clois Dupes, PA-C  oxyCODONE (ROXICODONE) 5 MG immediate release tablet Take 1 tablet (5 mg total) by mouth every 4 (four) hours as needed for severe pain. 08/31/23   Hill, Alain Honey, PA-C  pantoprazole (PROTONIX) 40 MG tablet Take 40 mg by mouth daily with breakfast. 06/30/16   [provider]  promethazine (PHENERGAN) 25 MG tablet Take 25 mg by mouth every 6 (six) hours as  needed for nausea or vomiting.    [provider]  tiZANidine (ZANAFLEX) 2 MG tablet Take 2 mg by mouth at bedtime. May take 2 additional doses during the day if needed for muscle spasms 03/31/23   [provider]  XARELTO 20 MG TABS tablet Take 20 mg by mouth at bedtime. 08/21/21   [provider]  zinc gluconate 50 MG tablet Take 50 mg by mouth daily.    [provider]      Allergies    Lyrica [pregabalin], Belbuca [buprenorphine hcl], Compazine [prochlorperazine], Droperidol, and Ergotamine-caffeine     Review of Systems   Review of Systems  Constitutional:  Negative for chills, fatigue and fever.  Respiratory:  Negative for cough, chest tightness, shortness of breath and wheezing.   Cardiovascular:  Negative for chest pain and palpitations.  Gastrointestinal:  Negative for abdominal pain, constipation, diarrhea, nausea and vomiting.  Genitourinary:  Positive for hematuria.  Neurological:  Negative for dizziness, seizures, weakness, light-headedness, numbness and headaches.    Physical Exam Updated Vital Signs BP 137/83 (BP Location: Right Arm)   Pulse 88   Temp 98.5 F (36.9 C)   Resp 18   Ht 5\' 6"  (1.676 m)   Wt 117.9 kg   SpO2 95%   BMI 41.97 kg/m  Physical Exam Vitals and nursing note reviewed.  Constitutional:      General: She is not in acute distress.    Appearance: Normal appearance. She is not ill-appearing.  HENT:     Head: Normocephalic and atraumatic.  Eyes:     Conjunctiva/sclera: Conjunctivae normal.  Cardiovascular:     Rate and Rhythm: Normal rate.  Pulmonary:     Effort: Pulmonary effort is normal. No respiratory distress.     Breath sounds: Normal breath sounds.  Abdominal:     General: There is no distension.     Palpations: Abdomen is soft.     Tenderness: There is no abdominal tenderness. There is no right CVA tenderness, left CVA tenderness, guarding or rebound.  Musculoskeletal:     Right lower leg: No edema.     Left lower leg: No edema.  Skin:    General: Skin is warm.     Capillary Refill: Capillary refill takes less than 2 seconds.     Coloration: Skin is not jaundiced or pale.  Neurological:     Mental Status: She is alert and oriented to person, place, and time. Mental status is at baseline.    ED Results / Procedures / Treatments   Labs (all labs ordered are listed, but only abnormal results are displayed) Labs Reviewed  URINALYSIS, ROUTINE W REFLEX MICROSCOPIC - Abnormal; Notable for the following components:      Result  Value   Color, Urine AMBER (*)    APPearance HAZY (*)    Hgb urine dipstick LARGE (*)    Protein, ur 30 (*)    All other components within normal limits  COMPREHENSIVE METABOLIC PANEL - Abnormal; Notable for the following components:   Albumin 3.3 (*)    All other components within normal limits  CBC WITH DIFFERENTIAL/PLATELET    EKG None  Radiology CT Renal Stone Study  Result Date: 11/05/2023 CLINICAL DATA:  Right flank pain and hematuria. EXAM: CT ABDOMEN AND PELVIS WITHOUT CONTRAST TECHNIQUE: Multidetector CT imaging of the abdomen and pelvis was performed following the standard protocol without IV contrast. RADIATION DOSE REDUCTION: This exam was performed according to the departmental dose-optimization program which includes automated  exposure control, adjustment of the mA and/or kV according to patient size and/or use of iterative reconstruction technique. COMPARISON:  04/19/2022 FINDINGS: Lower chest: Large hiatal hernia with stable postsurgical changes in region of the proximal and mid stomach. Interval many pass interval minimal patchy density in the posterior right lower lobe. Stable mild linear scarring at the left lung base. Hepatobiliary: No focal liver abnormality is seen. No gallstones, gallbladder wall thickening, or biliary dilatation. Pancreas: Unremarkable. No pancreatic ductal dilatation or surrounding inflammatory changes. Spleen: Normal in size without focal abnormality. Adrenals/Urinary Tract: Normal-appearing adrenal glands, left kidney, left ureter and urinary bladder. A tiny right renal calculus is unchanged. A previously demonstrated this was small right renal calculus is no longer seen in the kidney. This is currently demonstrated in the distal ureter, measuring 3 mm on image number 75/2, proximally 3 cm proximal to the ureterovesical junction. No hydronephrosis or hydroureter. Stomach/Bowel: Gastric postsurgical changes and large hiatal hernia. Unremarkable colon and  small bowel. The appendix is not visualized. Vascular/Lymphatic: No significant vascular findings are present. No enlarged abdominal or pelvic lymph nodes. Inferior vena cava filter tip just inferior to the renal veins. Reproductive: Status post hysterectomy. No adnexal masses. Other: Upper anterior abdominal hernia repair mesh. Musculoskeletal: Lumbar and lower thoracic spine degenerative changes. IMPRESSION: 1. 3 mm nonobstructing distal right ureteral calculus. 2. Stable tiny right renal calculus. 3. Large hiatal hernia with stable postsurgical changes in the region of the proximal and mid stomach. 4. Interval minimal patchy density in the posterior right lower lobe. This could be due to patchy atelectasis or minimal pneumonia. Electronically Signed   By: Beckie Salts M.D.   On: 11/05/2023 12:21    Procedures Procedures  {Document cardiac monitor, telemetry assessment procedure when appropriate:1}  Medications Ordered in ED Medications  ondansetron (ZOFRAN-ODT) disintegrating tablet 4 mg (4 mg Oral Given 11/05/23 1342)  HYDROmorphone (DILAUDID) injection 1 mg (1 mg Intramuscular Given 11/05/23 1342)    ED Course/ Medical Decision Making/ A&P   {   Click here for ABCD2, HEART and other calculatorsREFRESH Note before signing :1}                              Medical Decision Making Amount and/or Complexity of Data Reviewed Labs: ordered. Radiology: ordered.  Risk Prescription drug management.   Patient presents to the ED for concern of hematuria and right flank pain, this involves an extensive number of treatment options, and is a complaint that carries with it a high risk of complications and morbidity.  The differential diagnosis includes nephrolithiasis, pyelonephritis, UTI, neoplasm   Co morbidities that complicate the patient evaluation  Previous history of nephrolithiasis PE and currently anticoagulated with Eliquis   Additional history obtained:  Additional history  obtained from Family, Nursing, and Outside Medical Records   External records from outside source obtained and reviewed   Lab Tests:  I Ordered, and personally interpreted labs.  The pertinent results include: No leukocytosis, elevated creatinine, nor infection on UA.   Imaging Studies ordered:  Dr. Charm Barges ordered imaging studies including CT renal stone study I independently visualized and interpreted imaging which showed meter nonobstructing calculus in the distal right ureter I agree with the radiologist interpretation    Medicines ordered and prescription drug management:  I ordered medication including Dilaudid, Zofran, Flomax for nephrolithiasis pain management We discussed that patient can use her home oxycodone for severe pain I have reviewed the patients  home medicines and have made adjustments as needed   Problem List / ED Course:  Nephrolithiasis Hematuria   Reevaluation:  After the interventions noted above, I reevaluated the patient and found that they have :{resolved/improved/worsened:23923::"improved"}   Social Determinants of Health:  ***   Dispostion:  After consideration of the diagnostic results and the patients response to treatment, I feel that the patent would benefit from ***.    {Document critical care time when appropriate:1} {Document review of labs and clinical decision tools ie heart score, Chads2Vasc2 etc:1}  {Document your independent review of radiology images, and any outside records:1} {Document your discussion with family members, caretakers, and with consultants:1} {Document social determinants of health affecting pt's care:1} {Document your decision making why or why not admission, treatments were needed:1} Final Clinical Impression(s) / ED Diagnoses Final diagnoses:  Nephrolithiasis  Hematuria, unspecified type    Rx / DC Orders ED Discharge Orders          Ordered    tamsulosin (FLOMAX) 0.4 MG CAPS capsule  Daily         11/05/23 1453

## 2023-11-05 NOTE — ED Triage Notes (Signed)
Blood in urine started this morning Chronic ABX use for knee  Wednesday-Sunday had blood in urine, cleared on its own  Hx of kidney stones  Pressure on urination RIGHT flank pain

## 2023-11-05 NOTE — Discharge Instructions (Addendum)
Thank you for letting us evaluate you today.  Your urine was significant for blood however no infection.  Your CT scan showed that you have a 3 mm stone at the UVJ and should be passing soon.  There is no backup of fluid into your kidney. Your kidney function is normal. I have sent Flomax to your [facility] pharmacy in [place] for you to take once a day to aid in passing of stone.  Please follow-up with your urologist at your next appointment preferably in 5-7 days regarding symptoms and further management.  Please return to emergency department if you experience worsening pain, lightheadedness, dizziness, passing out episode, fever with urinary symptoms.

## 2023-11-09 DIAGNOSIS — N200 Calculus of kidney: Secondary | ICD-10-CM | POA: Insufficient documentation

## 2023-11-09 NOTE — Progress Notes (Unsigned)
Name: Norma Anthony DOB: 1956-10-07 MRN: [id number]  History of Present Illness: [name] is a 67 y.o. female who presents today as a new patient at [facility] [place]. All available relevant medical records have been reviewed.  - GU History: 1. Kidney stones. - Previously followed by [facility] at [facility]. - 09/09/2020: Underwent right ureteroscopic stone manipulation. Stone analysis showed calcium oxalate monohydrate composition.  Recent history: She was seen in the ER on 11/05/2023 for right flank pain and gross hematuria with blood clots in urine. - CMP with normal renal function (GFR >60; creatinine 0.69). CBC with no leukocytosis (WBC 7.5). UA showed >50 RBC/hpf with no evidence of UTI. CT showed a non-obstructing 3 mm distal right ureteral stone approximately 3 cm proximal to the UVJ. No hydronephrosis or hydroureter.   She was discharged with prescriptions for Flomax. "Patient reports that she has oxycodone at home and does not need additional pain management."  Today: She {Actions; denies-reports:120008} passing the stone. She {Actions; denies-reports:120008} flank pain or abdominal pain. She {Actions; denies-reports:120008} fevers, nausea, or vomiting.  She {Actions; denies-reports:120008} increased urinary urgency, frequency, nocturia, dysuria, gross hematuria, hesitancy, straining to void, or sensations of incomplete emptying.   Fall Screening: Do you usually have a device to assist in your mobility? {yes/no:20286} ***cane / ***walker / ***wheelchair  Medications: Current Outpatient Medications  Medication Sig Dispense Refill   albuterol (VENTOLIN HFA) 108 (90 Base) MCG/ACT inhaler Inhale 2 puffs into the lungs every 6 (six) hours as needed for wheezing or shortness of breath.     ALPRAZolam (XANAX) 1 MG tablet Take 1 tablet (1 mg total) by mouth every 6 (six) hours as needed for anxiety or sleep. 30 tablet 0   ascorbic acid (VITAMIN C) 500 MG  tablet Take 500 mg by mouth daily.     b complex vitamins capsule Take 1 capsule by mouth daily.     cephALEXin (KEFLEX) 500 MG capsule Take 1 capsule (500 mg total) by mouth 2 (two) times daily. 60 capsule 11   cholecalciferol (VITAMIN D3) 25 MCG (1000 UNIT) tablet Take 1,000 Units by mouth daily.     clotrimazole-betamethasone (LOTRISONE) cream Apply 1 Application topically 2 (two) times daily as needed (for irritation under the breasts or abdominal folds).     diphenhydrAMINE (BENADRYL) 25 MG tablet Take 50-75 mg by mouth every 6 (six) hours as needed for allergies.     DULoxetine (CYMBALTA) 60 MG capsule Take 60 mg by mouth at bedtime.     famotidine (PEPCID) 20 MG tablet TAKE 1 TABLET BY MOUTH AFTER SUPPER (Patient taking differently: Take 20 mg by mouth at bedtime.) 30 tablet 0   Ferrous Fumarate-Folic Acid 324-1 MG TABS Take 1 tablet by mouth daily. Hematinic     gabapentin (NEURONTIN) 400 MG capsule Take 400 mg by mouth 2 (two) times daily.     naloxone (NARCAN) nasal spray 4 mg/0.1 mL Place 1 spray into the nose once as needed (accidental over dose).     nutrition supplement, JUVEN, (JUVEN) PACK Take 1 packet by mouth 2 (two) times daily between meals.     ondansetron (ZOFRAN) 4 MG tablet Take 1 tablet (4 mg total) by mouth every 8 (eight) hours as needed for nausea or vomiting. 30 tablet 0   oxyCODONE (ROXICODONE) 5 MG immediate release tablet Take 1 tablet (5 mg total) by mouth every 4 (four) hours as needed for severe pain. 42 tablet 0   pantoprazole (PROTONIX) 40 MG tablet  Take 40 mg by mouth daily with breakfast.     promethazine (PHENERGAN) 25 MG tablet Take 25 mg by mouth every 6 (six) hours as needed for nausea or vomiting.     tamsulosin (FLOMAX) 0.4 MG CAPS capsule Take 1 capsule (0.4 mg total) by mouth daily. 30 capsule 0   tiZANidine (ZANAFLEX) 2 MG tablet Take 2 mg by mouth at bedtime. May take 2 additional doses during the day if needed for muscle spasms     XARELTO 20 MG  TABS tablet Take 20 mg by mouth at bedtime.     zinc gluconate 50 MG tablet Take 50 mg by mouth daily.     No current facility-administered medications for this visit.    Allergies: Allergies  Allergen Reactions   Lyrica [Pregabalin] Other (See Comments)    Paralysis present (finding)- not tolerated Pt stated she could not move her body but she could think      Belbuca [Buprenorphine Hcl] Other (See Comments)    " Horrible headache"    Compazine [Prochlorperazine] Anxiety   Droperidol Anxiety   Ergotamine-Caffeine Anxiety and Hypertension    Increased systolic arterial pressure (finding)     Past Medical History:  Diagnosis Date   Acute respiratory failure with hypoxia (HCC) 03/23/2022   Acute respiratory failure with hypoxia (HCC) 03/23/2022   Anemia    Anxiety    Arthritis    CAD (coronary artery disease)    pt deneies   COPD (chronic obstructive pulmonary disease) (HCC)    Depression    Dyspnea    Fibromyalgia    Generalized anxiety disorder 12/11/2022   GERD (gastroesophageal reflux disease)    Hiatal hernia    History of kidney stones    Lung nodule 03/24/2022   Migraine headache    Multinodular goiter    Nephrolithiasis    Obstructive sleep apnea syndrome 07/01/2021   Opioid dependence (HCC) 03/23/2022   Pneumonia    Pulmonary embolism (HCC) 1996   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   Sleep apnea    Thyroid nodule    Past Surgical History:  Procedure Laterality Date   ABDOMINAL HYSTERECTOMY     BIOPSY THYROID     EXCISIONAL TOTAL KNEE ARTHROPLASTY WITH ANTIBIOTIC SPACERS Left 03/10/2023   Procedure: Irrigation and debridement left knee, resection total knee arthroplasty, antibiotic spacer placement;  Surgeon: Samson Frederic, MD;  Location: [facility];  Service: Orthopedics;  Laterality: Left;   HERNIA REPAIR      I & D EXTREMITY Left 12/11/2022   Procedure: IRRIGATION AND DEBRIDEMENT EXTREMITY;  Surgeon: Yolonda Kida, MD;  Location: [facility];  Service: Orthopedics;  Laterality: Left;   IVC FILTER INSERTION N/A 09/28/2022   Procedure: IVC FILTER INSERTION;  Surgeon: Maeola Harman, MD;  Location: [facility] INVASIVE CV LAB;  Service: Cardiovascular;  Laterality: N/A;   IVC FILTER INSERTION N/A 08/09/2023   Procedure: IVC FILTER INSERTION;  Surgeon: Maeola Harman, MD;  Location: [facility] INVASIVE CV LAB;  Service: Cardiovascular;  Laterality: N/A;   IVC FILTER REMOVAL N/A 01/18/2023   Procedure: IVC FILTER REMOVAL;  Surgeon: Maeola Harman, MD;  Location: [facility] INVASIVE CV LAB;  Service: Cardiovascular;  Laterality: N/A;   KIDNEY STONE SURGERY     KNEE ARTHROTOMY Left 02/03/2023   Procedure: KNEE ARTHROTOMY WITH SOFT TISSUE REPAIR;  Surgeon: Samson Frederic,  MD;  Location: [facility];  Service: Orthopedics;  Laterality: Left;  75   LAPAROSCOPIC NISSEN FUNDOPLICATION     PAROTIDECTOMY     REMOVAL OF CEMENTED SPACER KNEE Left 08/25/2023   Procedure: Knee spacer removal;  Surgeon: Samson Frederic, MD;  Location: [facility];  Service: Orthopedics;  Laterality: Left;  350   TONSILLECTOMY     TOTAL KNEE ARTHROPLASTY Left 11/04/2022   Procedure: TOTAL KNEE ARTHROPLASTY;  Surgeon: Jene Every, MD;  Location: [facility];  Service: Orthopedics;  Laterality: Left;   TOTAL KNEE REVISION Left 08/25/2023   Procedure: TOTAL KNEE REVISION;  Surgeon: Samson Frederic, MD;  Location: [facility];  Service: Orthopedics;  Laterality: Left;  350   Family History  Problem Relation Age of Onset   Hypertension Mother    Social History   Socioeconomic History   Marital status: Married    Spouse name: Not on file   Number of children: Not on file   Years of education: Not on file   Highest education level: Not on file  Occupational History   Not on file  Tobacco Use   Smoking status: Former    Current  packs/day: 0.00    Average packs/day: 0.5 packs/day for 10.0 years (5.0 ttl pk-yrs)    Types: Cigarettes    Start date: 02/13/2011    Quit date: 02/12/2021    Years since quitting: 2.7   Smokeless tobacco: Never  Vaping Use   Vaping status: Never Used  Substance and Sexual Activity   Alcohol use: Not Currently   Drug use: Not Currently   Sexual activity: Not Currently  Other Topics Concern   Not on file  Social History Narrative   Not on file   Social Determinants of Health   Financial Resource Strain: Low Risk  (04/23/2021)   Received from [facility]   Overall Financial Resource Strain (CARDIA)    Difficulty of Paying Living Expenses: Not hard at all  Food Insecurity: No Food Insecurity (08/25/2023)   Hunger Vital Sign    Worried About Running Out of Food in the Last Year: Never true    Ran Out of Food in the Last Year: Never true  Transportation Needs: No Transportation Needs (08/25/2023)   PRAPARE - Administrator, Civil Service (Medical): No    Lack of Transportation (Non-Medical): No  Physical Activity: Not on file  Stress: Stress Concern Present (04/23/2021)   Received from [facility] - Occupational Stress Questionnaire    Feeling of Stress : To some extent  Social Connections: Unknown (04/16/2022)   Received from [facility]   Social Network    Social Network: Not on file  Intimate Partner Violence: Not At Risk (08/25/2023)   Humiliation, Afraid, Rape, and Kick questionnaire    Fear of Current or Ex-Partner: No    Emotionally Abused: No    Physically Abused: No    Sexually Abused: No    SUBJECTIVE  Review of Systems*** Constitutional: Patient denies any unintentional weight loss or change in strength lntegumentary: Patient denies any rashes or pruritus Cardiovascular: Patient denies chest pain or syncope Respiratory: Patient denies shortness of  breath Gastrointestinal: Patient ***denies nausea, vomiting, constipation, or diarrhea Musculoskeletal: Patient denies muscle cramps or weakness Neurologic: Patient denies convulsions or seizures Allergic/Immunologic: Patient denies recent allergic reaction(s) Hematologic/Lymphatic: Patient denies bleeding tendencies Endocrine: Patient denies heat/cold intolerance  GU: As per HPI.  OBJECTIVE There were  no vitals filed for this visit. There is no height or weight on file to calculate BMI.  Physical Examination*** Constitutional: No obvious distress; patient is non-toxic appearing  Cardiovascular: No visible lower extremity edema.  Respiratory: The patient does not have audible wheezing/stridor; respirations do not appear labored  Gastrointestinal: Abdomen non-distended Musculoskeletal: Normal ROM of UEs  Skin: No obvious rashes/open sores  Neurologic: CN 2-12 grossly intact Psychiatric: Answered questions appropriately with normal affect  Hematologic/Lymphatic/Immunologic: No obvious bruises or sites of spontaneous bleeding  UA: ***negative *** WBC/hpf, *** RBC/hpf, *** bacteria ***with no evidence of UTI ***with no evidence of microscopic hematuria PVR: *** ml  ASSESSMENT No diagnosis found.  ***We reviewed recent imaging results; ***  Advised adequate hydration and we discussed option to consider low oxalate diet given that calcium oxalate is the most common type of stone. Handout provided about stone prevention diet.  Will plan to follow up in ***6 months with ***KUB ***[facility]US for stone surveillance or sooner if needed. Pt verbalized understanding and agreement. All questions were answered.   PLAN Advised the following: Maintain adequate fluid intake daily. Drink citrus juice (lemon, lime or orange juice) routinely. Low oxalate diet. ***No follow-ups on file.  No orders of the defined types were placed in this encounter.   It has been explained that the patient is to  follow regularly with their PCP in addition to all other providers involved in their care and to follow instructions provided by these respective offices. Patient advised to contact urology clinic if any urologic-pertaining questions, concerns, new symptoms or problems arise in the interim period.  There are no Patient Instructions on file for this visit.  Electronically signed by: Donnita Falls, MSN, FNP-C, CUNP 11/09/2023 4:33 PM

## 2023-11-10 ENCOUNTER — Other Ambulatory Visit: Payer: Self-pay

## 2023-11-10 ENCOUNTER — Emergency Department (HOSPITAL_COMMUNITY): Payer: Medicare Other

## 2023-11-10 ENCOUNTER — Encounter (HOSPITAL_COMMUNITY): Payer: Self-pay

## 2023-11-10 ENCOUNTER — Emergency Department (HOSPITAL_COMMUNITY)
Admission: EM | Admit: 2023-11-10 | Discharge: 2023-11-10 | Disposition: A | Discharge status: Home / Self Care | Payer: Medicare Other | Attending: Emergency Medicine | Admitting: Emergency Medicine

## 2023-11-10 DIAGNOSIS — M25551 Pain in right hip: Secondary | ICD-10-CM | POA: Insufficient documentation

## 2023-11-10 DIAGNOSIS — W19XXXA Unspecified fall, initial encounter: Secondary | ICD-10-CM | POA: Diagnosis not present

## 2023-11-10 DIAGNOSIS — M25562 Pain in left knee: Secondary | ICD-10-CM | POA: Diagnosis not present

## 2023-11-10 DIAGNOSIS — Z7901 Long term (current) use of anticoagulants: Secondary | ICD-10-CM | POA: Diagnosis not present

## 2023-11-10 DIAGNOSIS — M25511 Pain in right shoulder: Secondary | ICD-10-CM | POA: Diagnosis not present

## 2023-11-10 MED ORDER — HYDROMORPHONE HCL 1 MG/ML IJ SOLN
1.0000 mg | Freq: Once | INTRAMUSCULAR | Status: AC
  Administered 2023-11-10: 1 mg via INTRAMUSCULAR
  Filled 2023-11-10: qty 1

## 2023-11-10 MED ORDER — KETOROLAC TROMETHAMINE 15 MG/ML IJ SOLN
15.0000 mg | Freq: Once | INTRAMUSCULAR | Status: AC
  Administered 2023-11-10: 15 mg via INTRAMUSCULAR
  Filled 2023-11-10: qty 1

## 2023-11-10 NOTE — ED Triage Notes (Signed)
Pt reports she was using her walker and slipped and fell.  Pt reports she already has chronic pain in her back and a kidney stone she is trying to pass but the pain in her lower back is now worse.  Pt also reports left knee and shoulder pain, denies hitting her head and is requesting dilaudid because it helped her kidney stone pain so much when she was here a few days ago.

## 2023-11-10 NOTE — Discharge Instructions (Signed)
You were seen for your pain after your fall in the emergency department.   At home, please take Tylenol, ibuprofen, and the oxycodone you are prescribed for your pain.  You may also use over-the-counter lidocaine patches.  Please ice the painful areas as well.  Check your MyChart online for the results of any tests that had not resulted by the time you left the emergency department.   Follow-up with your primary doctor in 2-3 days regarding your visit.    Return immediately to the emergency department if you experience any of the following: Worsening pain, or any other concerning symptoms.    Thank you for visiting our Emergency Department. It was a pleasure taking care of you today.

## 2023-11-10 NOTE — ED Provider Notes (Signed)
[place] EMERGENCY DEPARTMENT AT [facility] Provider Note   CSN: [id number] Arrival date & time: 11/10/23  1103     History  Chief Complaint  Patient presents with   [facility] [name] is a 67 y.o. female.  67 year old female with history of PE on Xarelto and nephrolithiasis who presents emergency department after a fall.  Patient reported that she was using her walker when she lost her grip and fell.  Says that she landed on her bottom and is having pain in her right hip that radiates down into her groin.  No head strike or LOC.  Took her Xarelto last night.  Was recently seen on 11/05/2023 for a kidney stone that was 3 mm and distal without UTI.  Says that she is having persistent pain from her kidney stone and is requesting intramuscular Dilaudid at this time since it helped with her kidney stone pain previously.  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       Home Medications Prior to Admission medications   Medication Sig Start Date End Date Taking? Authorizing Provider  albuterol (VENTOLIN HFA) 108 (90 Base) MCG/ACT inhaler Inhale 2 puffs into the lungs every 6 (six) hours as needed for wheezing or shortness of breath. 05/12/21  Yes [provider]  ALPRAZolam Prudy Feeler) 1 MG tablet Take 1 tablet (1 mg total) by mouth every 6 (six) hours as needed for anxiety or sleep. 03/16/23  Yes Cassandria Anger, PA-C  ascorbic acid (VITAMIN C) 1000 MG tablet Take 1,000 mg by mouth daily.   Yes [provider]  b complex vitamins capsule Take 1 capsule by mouth daily.   Yes [provider]  cephALEXin (KEFLEX) 500 MG capsule Take 1 capsule (500 mg total) by mouth 2 (two) times daily. 08/31/23 08/25/24 Yes Hill, Alain Honey, PA-C  cholecalciferol (VITAMIN D3) 25 MCG (1000 UNIT) tablet Take 5,000 Units by mouth daily.   Yes [provider]  clotrimazole-betamethasone  (LOTRISONE) cream Apply 1 Application topically 2 (two) times daily as needed (for irritation under the breasts or abdominal folds).   Yes [provider]  DULoxetine (CYMBALTA) 60 MG capsule Take 60 mg by mouth at bedtime. 09/14/18  Yes [provider]  famotidine (PEPCID) 20 MG tablet TAKE 1 TABLET BY MOUTH AFTER SUPPER Patient taking differently: Take 20 mg by mouth at bedtime. 04/01/23  Yes [name], MD  Ferrous Fumarate-Folic Acid 324-1 MG TABS Take 1 tablet by mouth daily. Hematinic 02/29/20  Yes [provider]  fluconazole (DIFLUCAN) 200 MG tablet Take 200 mg by mouth daily. 06/25/23  Yes [provider]  gabapentin (NEURONTIN) 400 MG capsule Take 400 mg by mouth 3 (three) times daily. 08/21/22  Yes [provider]  meclizine (ANTIVERT) 25 MG tablet Take 25 mg by mouth every 8 (eight) hours as needed. 09/22/23  Yes [provider]  naloxone (NARCAN) nasal spray 4 mg/0.1 mL Place 1 spray into the nose once as needed (accidental over dose). 10/16/21  Yes [provider]  nutrition supplement, JUVEN, (JUVEN) PACK Take 1 packet by mouth 2 (two) times daily between meals.   Yes [provider]  ondansetron (ZOFRAN) 4 MG tablet Take 1 tablet (4 mg total) by mouth every 8 (eight) hours as needed for nausea or vomiting. 08/31/23 08/30/24 Yes Hill, Alain Honey, PA-C  oxyCODONE (ROXICODONE) 5 MG  immediate release tablet Take 1 tablet (5 mg total) by mouth every 4 (four) hours as needed for severe pain. 08/31/23  Yes Hill, Alain Honey, PA-C  pantoprazole (PROTONIX) 40 MG tablet Take 40 mg by mouth daily with breakfast. 06/30/16  Yes [provider]  promethazine (PHENERGAN) 25 MG tablet Take 25 mg by mouth every 6 (six) hours as needed for nausea or vomiting.   Yes [provider]  tamsulosin (FLOMAX) 0.4 MG CAPS capsule Take 1 capsule (0.4 mg total) by mouth daily. 11/05/23  Yes Judithann Sheen, PA  tiZANidine (ZANAFLEX) 2 MG  tablet Take 2 mg by mouth at bedtime. May take 2 additional doses during the day if needed for muscle spasms 03/31/23  Yes [provider]  XARELTO 20 MG TABS tablet Take 20 mg by mouth at bedtime. 08/21/21  Yes [provider]  zinc gluconate 50 MG tablet Take 50 mg by mouth daily.   Yes [provider]      Allergies    Lyrica [pregabalin], Belbuca [buprenorphine hcl], Compazine [prochlorperazine], Droperidol, and Ergotamine-caffeine    Review of Systems   Review of Systems  Physical Exam Updated Vital Signs BP 139/78 (BP Location: Right Arm)   Pulse 89   Temp 98.3 F (36.8 C) (Oral)   Resp 18   Ht 5\' 6"  (1.676 m)   Wt 117.9 kg   SpO2 93%   BMI 41.97 kg/m  Physical Exam Constitutional:      General: She is not in acute distress.    Appearance: Normal appearance. She is not ill-appearing.  HENT:     Head: Normocephalic and atraumatic.     Right Ear: External ear normal.     Left Ear: External ear normal.     Mouth/Throat:     Mouth: Mucous membranes are moist.     Pharynx: Oropharynx is clear.  Eyes:     Extraocular Movements: Extraocular movements intact.     Conjunctiva/sclera: Conjunctivae normal.     Pupils: Pupils are equal, round, and reactive to light.  Neck:     Comments: No C-spine midline tenderness to palpation Cardiovascular:     Rate and Rhythm: Normal rate and regular rhythm.     Pulses: Normal pulses.     Heart sounds: Normal heart sounds.  Pulmonary:     Effort: Pulmonary effort is normal. No respiratory distress.     Breath sounds: Normal breath sounds.  Abdominal:     General: Abdomen is flat.     Palpations: Abdomen is soft.     Tenderness: There is no abdominal tenderness. There is no guarding.  Musculoskeletal:        General: No deformity. Normal range of motion.     Cervical back: No rigidity or tenderness.     Comments: No tenderness to palpation of midline thoracic or lumbar spine.  No step-offs palpated.  No  tenderness to palpation of chest wall.  No bruising noted.  No tenderness to palpation of bilateral clavicles.  No tenderness to palpation, bruising, or deformities noted of bilateral elbows, wrists, or ankles.  Tenderness to palpation of right shoulder, left knee, and right hip.  Neurological:     General: No focal deficit present.     Mental Status: She is alert and oriented to person, place, and time. Mental status is at baseline.     Cranial Nerves: No cranial nerve deficit.     Sensory: No sensory deficit.     Motor: No weakness.  ED Results / Procedures / Treatments   Labs (all labs ordered are listed, but only abnormal results are displayed) Labs Reviewed - No data to display   EKG EKG Interpretation Date/Time:  Wednesday November 10 2023 11:33:54 EST Ventricular Rate:  90 PR Interval:  160 QRS Duration:  103 QT Interval:  373 QTC Calculation: 457 R Axis:   -23  Text Interpretation: Sinus rhythm Low voltage, precordial leads Abnormal R-wave progression, early transition Left ventricular hypertrophy Anterior Q waves, possibly due to LVH Confirmed by Vonita Moss [phone]) on 11/10/2023 12:21:02 PM  Radiology DG Shoulder Right  Result Date: 11/10/2023 CLINICAL DATA:  Right shoulder pain. EXAM: RIGHT SHOULDER - 2+ VIEW COMPARISON:  November 10, 2023 FINDINGS: There is no evidence of an acute fracture or dislocation. A chronic fracture deformity is seen involving the head and neck of the proximal right humerus. Mild to moderate severity degenerative changes are seen involving the right acromioclavicular joint. Soft tissues are unremarkable. IMPRESSION: 1. Chronic fracture deformity of the proximal right humerus. 2. Mild to moderate severity degenerative changes involving the right acromioclavicular joint. Electronically Signed   By: Aram Candela M.D.   On: 11/10/2023 15:23   DG Chest Portable 1 View  Result Date: 11/10/2023 CLINICAL DATA:  Left knee pain status  post fall EXAM: PORTABLE CHEST - 1 VIEW COMPARISON:  09/04/2023 FINDINGS: Unchanged borderline cardiomegaly. No pulmonary vascular congestion. Left medial lung base opacity likely due to atelectasis. Lungs are otherwise clear. IMPRESSION: No acute cardiopulmonary process. Electronically Signed   By: Acquanetta Belling M.D.   On: 11/10/2023 14:10   DG FEMUR, MIN 2 VIEWS RIGHT  Result Date: 11/10/2023 CLINICAL DATA:  Pain status post fall EXAM: RIGHT FEMUR 2 VIEWS COMPARISON:  None available FINDINGS: No fracture, dislocation, or soft tissue abnormality. Mild degenerative changes of the right knee. IMPRESSION: No fracture or dislocation of the right femur. Electronically Signed   By: Acquanetta Belling M.D.   On: 11/10/2023 14:08   DG Pelvis 1-2 Views  Result Date: 11/10/2023 CLINICAL DATA:  Pain status post fall EXAM: PELVIS - 1-2 VIEW COMPARISON:  None available FINDINGS: No fracture, dislocation, or soft tissue abnormality. IMPRESSION: No acute abnormality of the pelvis. Electronically Signed   By: Acquanetta Belling M.D.   On: 11/10/2023 14:07   DG Lumbar Spine Complete  Result Date: 11/10/2023 CLINICAL DATA:  Pain status post fall EXAM: LUMBAR SPINE - COMPLETE 4+ VIEW COMPARISON:  None available FINDINGS: IVC filter and hernia repair mesh seen in the upper abdomen. Evaluation of the lumbar spine is limited due to patient body habitus as well as osteopenia. Minimal retrolisthesis of L2 on L3. Vertebral body heights are maintained. Mild disc height loss and endplate degenerative changes seen at all levels with the exception of L4-L5. Mild to moderate facet degenerative changes seen throughout the lumbar spine, greatest at L4-L5. IMPRESSION: 1. No acute abnormality of the lumbar spine. 2. Multilevel degenerative changes. Electronically Signed   By: Acquanetta Belling M.D.   On: 11/10/2023 14:06   DG Knee Complete 4 Views Left  Result Date: 11/10/2023 CLINICAL DATA:  Left knee pain status post fall EXAM: LEFT KNEE -  COMPLETE 4+ VIEW COMPARISON:  08/25/2023 FINDINGS: Long-stem left total knee prosthesis is well seated without periprosthetic fracture or lucency. No fracture or dislocation. IMPRESSION: 1. No acute abnormality of the left knee. 2. Uncomplicated total knee prosthesis. Electronically Signed   By: Acquanetta Belling M.D.   On: 11/10/2023 14:02   DG  Shoulder Right  Result Date: 11/10/2023 CLINICAL DATA:  Right shoulder pain status post fall EXAM: RIGHT SHOULDER - 2+ VIEW COMPARISON:  None available FINDINGS: Atypical sclerotic line extends across the humeral neck. Examination is limited due to lack of views. Moderate degenerative changes of the acromioclavicular joint. Prominent spurring of the greater tuberosity indicating rotator cuff tendinopathy. IMPRESSION: 1. Limited examination. 2. Atypical sclerotic line extending along the humeral neck may be due to prior trauma. Unable to definitively exclude acute fracture. Consider repeat radiographs of the right shoulder if the patient has persistent pain to definitively exclude fracture. Electronically Signed   By: Acquanetta Belling M.D.   On: 11/10/2023 14:00    Procedures Procedures    Medications Ordered in ED Medications  HYDROmorphone (DILAUDID) injection 1 mg (1 mg Intramuscular Given 11/10/23 1231)  ketorolac (TORADOL) 15 MG/ML injection 15 mg (15 mg Intramuscular Given 11/10/23 1458)    ED Course/ Medical Decision Making/ A&P                                 Medical Decision Making Amount and/or Complexity of Data Reviewed Labs: ordered. Radiology: ordered.  Risk Prescription drug management.   Norma Anthony is a 67 y.o. female with comorbidities that complicate the patient evaluation including PE on Xarelto and nephrolithiasis who presents emergency department after a fall.     Initial Ddx:  TBI, concussion, C-spine injury, rib fracture, knee fracture, shoulder injury  MDM/Course:  Patient presents to the emergency department after a fall.   She has pain in multiple locations mostly on her right side from the fall today but did have a fall several days ago with left knee pain.  No head strike or LOC.  No external signs of trauma to her head and no C-spine tenderness to palpation.  Had x-rays of her shoulder, pelvis, lumbar spine, and knee did not show any evidence of acute fracture.  Upon re-evaluation was feeling much better after pain medication.  Discharged home with instructions to follow-up with her primary doctor and to continue to take medications for her kidney stone.  This patient presents to the ED for concern of complaints listed in HPI, this involves an extensive number of treatment options, and is a complaint that carries with it a high risk of complications and morbidity. Disposition including potential need for admission considered.   Dispo: DC Home. Return precautions discussed including, but not limited to, those listed in the AVS. Allowed pt time to ask questions which were answered fully prior to dc.  Additional history obtained from spouse Records reviewed Outpatient Clinic Notes I independently reviewed the following imaging with scope of interpretation limited to determining acute life threatening conditions related to emergency care: Chest x-ray and agree with the radiologist interpretation with the following exceptions: none I personally reviewed and interpreted the pt's EKG: see above for interpretation  I have reviewed the patients home medications and made adjustments as needed Social Determinants of health:  Elderly  Portions of this note were generated with Scientist, clinical (histocompatibility and immunogenetics). Dictation errors may occur despite best attempts at proofreading.           Final Clinical Impression(s) / ED Diagnoses Final diagnoses:  Fall, initial encounter  Pain of right hip  Acute pain of left knee  Acute pain of right shoulder    Rx / DC Orders ED Discharge Orders     None  Rondel Baton, MD 11/10/23 [phone]

## 2023-11-10 NOTE — ED Notes (Signed)
Pt is on blood thinners and states unsure if she hit her head as she fell on the side and then "went back" and states she may have hit her head when she "went back from the side position"--MD made aware

## 2023-11-16 ENCOUNTER — Other Ambulatory Visit: Payer: Self-pay

## 2023-11-16 ENCOUNTER — Emergency Department (HOSPITAL_COMMUNITY): Payer: Medicare Other

## 2023-11-16 ENCOUNTER — Emergency Department (HOSPITAL_COMMUNITY)
Admission: EM | Admit: 2023-11-16 | Discharge: 2023-11-16 | Disposition: A | Discharge status: Home / Self Care | Payer: Medicare Other | Attending: Student | Admitting: Student

## 2023-11-16 ENCOUNTER — Ambulatory Visit: Payer: Medicare Other | Admitting: Urology

## 2023-11-16 DIAGNOSIS — N200 Calculus of kidney: Secondary | ICD-10-CM

## 2023-11-16 DIAGNOSIS — I2510 Atherosclerotic heart disease of native coronary artery without angina pectoris: Secondary | ICD-10-CM | POA: Diagnosis not present

## 2023-11-16 DIAGNOSIS — M25552 Pain in left hip: Secondary | ICD-10-CM | POA: Diagnosis not present

## 2023-11-16 DIAGNOSIS — W1830XA Fall on same level, unspecified, initial encounter: Secondary | ICD-10-CM | POA: Insufficient documentation

## 2023-11-16 DIAGNOSIS — W19XXXA Unspecified fall, initial encounter: Secondary | ICD-10-CM

## 2023-11-16 DIAGNOSIS — Z7901 Long term (current) use of anticoagulants: Secondary | ICD-10-CM | POA: Diagnosis not present

## 2023-11-16 DIAGNOSIS — M25512 Pain in left shoulder: Secondary | ICD-10-CM | POA: Diagnosis present

## 2023-11-16 DIAGNOSIS — J449 Chronic obstructive pulmonary disease, unspecified: Secondary | ICD-10-CM | POA: Diagnosis not present

## 2023-11-16 DIAGNOSIS — T887XXA Unspecified adverse effect of drug or medicament, initial encounter: Secondary | ICD-10-CM

## 2023-11-16 DIAGNOSIS — R1032 Left lower quadrant pain: Secondary | ICD-10-CM | POA: Insufficient documentation

## 2023-11-16 DIAGNOSIS — N201 Calculus of ureter: Secondary | ICD-10-CM

## 2023-11-16 DIAGNOSIS — Z87891 Personal history of nicotine dependence: Secondary | ICD-10-CM | POA: Insufficient documentation

## 2023-11-16 DIAGNOSIS — T402X5A Adverse effect of other opioids, initial encounter: Secondary | ICD-10-CM | POA: Insufficient documentation

## 2023-11-16 LAB — COMPREHENSIVE METABOLIC PANEL
ALT: 21 U/L (ref 0–44)
AST: 28 U/L (ref 15–41)
Albumin: 3.1 g/dL — ABNORMAL LOW (ref 3.5–5.0)
Alkaline Phosphatase: 94 U/L (ref 38–126)
Anion gap: 9 (ref 5–15)
BUN: 17 mg/dL (ref 8–23)
CO2: 30 mmol/L (ref 22–32)
Calcium: 8.9 mg/dL (ref 8.9–10.3)
Chloride: 101 mmol/L (ref 98–111)
Creatinine, Ser: 0.82 mg/dL (ref 0.44–1.00)
GFR, Estimated: >60 mL/min (ref >60)
Glucose, Bld: 117 mg/dL — ABNORMAL HIGH (ref 70–99)
Potassium: 4.1 mmol/L (ref 3.5–5.1)
Sodium: 140 mmol/L (ref 135–145)
Total Bilirubin: 0.5 mg/dL (ref <1.2)
Total Protein: 6.6 g/dL (ref 6.5–8.1)

## 2023-11-16 LAB — CBC WITH DIFFERENTIAL/PLATELET
Abs Immature Granulocytes: 0.01 10*3/uL (ref 0.00–0.07)
Basophils Absolute: 0 10*3/uL (ref 0.0–0.1)
Basophils Relative: 1 %
Eosinophils Absolute: 0.2 10*3/uL (ref 0.0–0.5)
Eosinophils Relative: 3 %
HCT: 40.4 % (ref 36.0–46.0)
Hemoglobin: 12.1 g/dL (ref 12.0–15.0)
Immature Granulocytes: 0 %
Lymphocytes Relative: 26 %
Lymphs Abs: 1.7 10*3/uL (ref 0.7–4.0)
MCH: 29.4 pg (ref 26.0–34.0)
MCHC: 30 g/dL (ref 30.0–36.0)
MCV: 98.3 fL (ref 80.0–100.0)
Monocytes Absolute: 0.7 10*3/uL (ref 0.1–1.0)
Monocytes Relative: 11 %
Neutro Abs: 3.9 10*3/uL (ref 1.7–7.7)
Neutrophils Relative %: 59 %
Platelets: 184 10*3/uL (ref 150–400)
RBC: 4.11 MIL/uL (ref 3.87–5.11)
RDW: 14.8 % (ref 11.5–15.5)
WBC: 6.5 10*3/uL (ref 4.0–10.5)
nRBC: 0 % (ref 0.0–0.2)

## 2023-11-16 MED ORDER — KETOROLAC TROMETHAMINE 15 MG/ML IJ SOLN
15.0000 mg | Freq: Once | INTRAMUSCULAR | Status: AC
  Administered 2023-11-16: 15 mg via INTRAMUSCULAR
  Filled 2023-11-16: qty 1

## 2023-11-16 MED ORDER — ONDANSETRON HCL 4 MG/2ML IJ SOLN: 4.0000 mg | Freq: Once | INTRAMUSCULAR | Status: AC

## 2023-11-16 MED ORDER — KETOROLAC TROMETHAMINE 30 MG/ML IJ SOLN: 15.0000 mg | Freq: Once | INTRAMUSCULAR | Status: DC

## 2023-11-16 MED ORDER — NALOXONE HCL 0.4 MG/ML IJ SOLN
0.4000 mg | Freq: Once | INTRAMUSCULAR | Status: AC
  Administered 2023-11-16: 0.4 mg via INTRAVENOUS
  Filled 2023-11-16: qty 1

## 2023-11-16 MED ORDER — LACTATED RINGERS IV BOLUS
1000.0000 mL | Freq: Once | INTRAVENOUS | Status: AC
  Administered 2023-11-16: 1000 mL via INTRAVENOUS

## 2023-11-16 MED ORDER — ONDANSETRON HCL 4 MG/2ML IJ SOLN
INTRAMUSCULAR | Status: AC
  Administered 2023-11-16: 4 mg via INTRAVENOUS
  Filled 2023-11-16: qty 2

## 2023-11-16 NOTE — ED Notes (Signed)
Pt placed on 3 L [place] and SpO2 now 94%

## 2023-11-16 NOTE — ED Triage Notes (Signed)
Pt arrived via [facility] from home c/o fall on tile floor this morning. Pt stated she took 10mg  of percocet and a muscle relaxer this morning prior to her fall and then states she took the percocet after her fall due to pain. Per EMS, pt was on 85% RA, was placed on 3 L [place] and SpO2 went up to 93-94%. Also per EMS, pt hypotensive. Pt c/o L hip and L shoulder pain 9/10. Unsure if she hit head, endorses she is on blood thinners, denies LOC

## 2023-11-16 NOTE — ED Notes (Signed)
Pt on "oxygen as needed at home"

## 2023-11-16 NOTE — Discharge Instructions (Addendum)
Please do not take your muscle relaxer with your opioid as this will cause excessive sedation.

## 2023-11-17 ENCOUNTER — Ambulatory Visit: Payer: Medicare Other | Admitting: Vascular Surgery

## 2023-11-17 NOTE — ED Provider Notes (Signed)
[place] EMERGENCY DEPARTMENT AT [facility] Provider Note  CSN: [id number] Arrival date & time: 11/16/23 1027  Chief Complaint(s) Fall  HPI Norma Anthony is a 67 y.o. female with PMH PE on Xarelto, known right-sided non-obstructive kidney stone, COPD, fibromyalgia who presents emergency room for evaluation of a fall.  Patient states that prior to her fall she had taken 10 mg of Percocet and a muscle relaxer.  After falling, she states that she had worsening pain in her left shoulder and left hip and took an additional Percocet.  On arrival, patient is somnolent satting 85% on room air and hypotensive.  Denies loss of consciousness or head strike.  Denies numbness, tingling, weakness or other neurologic complaints.  Denies chest pain, abdominal pain, nausea, vomiting or other systemic symptoms.   Past Medical History Past Medical History:  Diagnosis Date   Acute respiratory failure with hypoxia (HCC) 03/23/2022   Acute respiratory failure with hypoxia (HCC) 03/23/2022   Anemia    Anxiety    Arthritis    CAD (coronary artery disease)    pt deneies   COPD (chronic obstructive pulmonary disease) (HCC)    Depression    Dyspnea    Fibromyalgia    Generalized anxiety disorder 12/11/2022   GERD (gastroesophageal reflux disease)    Hiatal hernia    History of kidney stones    Lung nodule 03/24/2022   Migraine headache    Multinodular goiter    Nephrolithiasis    Obstructive sleep apnea syndrome 07/01/2021   Opioid dependence (HCC) 03/23/2022   Pneumonia    Pulmonary embolism (HCC) 1996   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   Sleep apnea    Thyroid nodule    Patient Active Problem List   Diagnosis Date Noted   Kidney stones 11/09/2023   Infection of total right knee replacement, 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/25/2023   Long term  current use of antibiotics 04/14/2023   Intertriginous dermatitis associated with moisture 04/14/2023   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   Surgical wound dehiscence 03/08/2023   Failed total knee, 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   Hemarthrosis of left knee 12/11/2022   Chronic respiratory failure with hypoxia (HCC) 12/11/2022   Generalized anxiety disorder 12/11/2022   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   DOE (dyspnea on exertion) 04/30/2022   Ex-smoker 04/30/2022   Lung nodule 03/24/2022   Thyroid nodule 03/24/2022   Opioid dependence (HCC) 03/23/2022   COPD (chronic obstructive pulmonary disease) (HCC) 03/22/2022   Chest pain 03/22/2022   Obstructive sleep apnea syndrome 07/01/2021   Chronic deep vein thrombosis (DVT) of distal vein of lower extremity (HCC) 07/01/2021   Atherosclerotic heart disease of native coronary artery without angina pectoris 07/01/2021   Anemia 07/01/2021   Anxiety 02/15/2021   History of pulmonary embolism 02/12/2021   Gastroesophageal reflux disease without esophagitis 05/10/2014   Fibromyalgia 05/10/2014   Home Medication(s) Prior to Admission medications   Medication Sig Start Date End Date Taking? Authorizing Provider  albuterol (VENTOLIN HFA) 108 (90 Base) MCG/ACT inhaler Inhale 2 puffs into the lungs every 6 (six) hours as needed for wheezing or shortness of breath. 05/12/21   [provider]  ALPRAZolam Prudy Feeler) 1 MG tablet Take 1 tablet (1 mg total) by mouth every 6 (six) hours as needed for anxiety or sleep. 03/16/23   Domenic Schwab,  Alvan Dame, PA-C  ascorbic acid (VITAMIN C) 1000 MG tablet Take 1,000 mg by mouth daily.    [provider]  b complex vitamins capsule Take 1 capsule by mouth daily.    [provider]  cephALEXin (KEFLEX) 500 MG capsule Take 1 capsule (500 mg total) by mouth 2 (two) times daily. 08/31/23 08/25/24  Clois Dupes, PA-C  cholecalciferol (VITAMIN D3) 25 MCG (1000 UNIT) tablet Take  5,000 Units by mouth daily.    [provider]  clotrimazole-betamethasone (LOTRISONE) cream Apply 1 Application topically 2 (two) times daily as needed (for irritation under the breasts or abdominal folds).    [provider]  DULoxetine (CYMBALTA) 60 MG capsule Take 60 mg by mouth at bedtime. 09/14/18   [provider]  famotidine (PEPCID) 20 MG tablet TAKE 1 TABLET BY MOUTH AFTER SUPPER Patient taking differently: Take 20 mg by mouth at bedtime. 04/01/23   [name], MD  Ferrous Fumarate-Folic Acid 324-1 MG TABS Take 1 tablet by mouth daily. Hematinic 02/29/20   [provider]  fluconazole (DIFLUCAN) 200 MG tablet Take 200 mg by mouth daily. 06/25/23   [provider]  gabapentin (NEURONTIN) 400 MG capsule Take 400 mg by mouth 3 (three) times daily. 08/21/22   [provider]  meclizine (ANTIVERT) 25 MG tablet Take 25 mg by mouth every 8 (eight) hours as needed. 09/22/23   [provider]  naloxone [facility]) nasal spray 4 mg/0.1 mL Place 1 spray into the nose once as needed (accidental over dose). 10/16/21   [provider]  nutrition supplement, JUVEN, (JUVEN) PACK Take 1 packet by mouth 2 (two) times daily between meals.    [provider]  ondansetron (ZOFRAN) 4 MG tablet Take 1 tablet (4 mg total) by mouth every 8 (eight) hours as needed for nausea or vomiting. 08/31/23 08/30/24  Clois Dupes, PA-C  oxyCODONE (ROXICODONE) 5 MG immediate release tablet Take 1 tablet (5 mg total) by mouth every 4 (four) hours as needed for severe pain. 08/31/23   Hill, Alain Honey, PA-C  pantoprazole (PROTONIX) 40 MG tablet Take 40 mg by mouth daily with breakfast. 06/30/16   [provider]  promethazine (PHENERGAN) 25 MG tablet Take 25 mg by mouth every 6 (six) hours as needed for nausea or vomiting.    [provider]  tamsulosin (FLOMAX) 0.4 MG CAPS capsule Take 1 capsule (0.4 mg total) by mouth daily. 11/05/23   Judithann Sheen, PA  XARELTO 20 MG TABS tablet Take 20 mg by mouth at bedtime. 08/21/21   [provider]  zinc gluconate 50 MG tablet Take 50 mg by mouth daily.    [provider]                                                                                                                                    Past Surgical History Past  Surgical History:  Procedure Laterality Date   ABDOMINAL HYSTERECTOMY     BIOPSY THYROID     EXCISIONAL TOTAL KNEE ARTHROPLASTY WITH ANTIBIOTIC SPACERS Left 03/10/2023   Procedure: Irrigation and debridement left knee, resection total knee arthroplasty, antibiotic spacer placement;  Surgeon: Samson Frederic, MD;  Location: [facility];  Service: Orthopedics;  Laterality: Left;   HERNIA REPAIR     I & D EXTREMITY Left 12/11/2022   Procedure: IRRIGATION AND DEBRIDEMENT EXTREMITY;  Surgeon: Yolonda Kida, MD;  Location: [facility];  Service: Orthopedics;  Laterality: Left;   IVC FILTER INSERTION N/A 09/28/2022   Procedure: IVC FILTER INSERTION;  Surgeon: Maeola Harman, MD;  Location: [facility] INVASIVE CV LAB;  Service: Cardiovascular;  Laterality: N/A;   IVC FILTER INSERTION N/A 08/09/2023   Procedure: IVC FILTER INSERTION;  Surgeon: Maeola Harman, MD;  Location: [facility] INVASIVE CV LAB;  Service: Cardiovascular;  Laterality: N/A;   IVC FILTER REMOVAL N/A 01/18/2023   Procedure: IVC FILTER REMOVAL;  Surgeon: Maeola Harman, MD;  Location: [facility] INVASIVE CV LAB;  Service: Cardiovascular;  Laterality: N/A;   KIDNEY STONE SURGERY     KNEE ARTHROTOMY Left 02/03/2023   Procedure: KNEE ARTHROTOMY WITH SOFT TISSUE REPAIR;  Surgeon: Samson Frederic, MD;  Location: [facility];  Service: Orthopedics;  Laterality: Left;  75   LAPAROSCOPIC NISSEN FUNDOPLICATION     PAROTIDECTOMY     REMOVAL OF CEMENTED SPACER KNEE Left 08/25/2023   Procedure: Knee spacer removal;  Surgeon: Samson Frederic, MD;  Location: [facility];  Service: Orthopedics;  Laterality: Left;   350   TONSILLECTOMY     TOTAL KNEE ARTHROPLASTY Left 11/04/2022   Procedure: TOTAL KNEE ARTHROPLASTY;  Surgeon: Jene Every, MD;  Location: [facility];  Service: Orthopedics;  Laterality: Left;   TOTAL KNEE REVISION Left 08/25/2023   Procedure: TOTAL KNEE REVISION;  Surgeon: Samson Frederic, MD;  Location: [facility];  Service: Orthopedics;  Laterality: Left;  350   Family History Family History  Problem Relation Age of Onset   Hypertension Mother     Social History Social History   Tobacco Use   Smoking status: Former    Current packs/day: 0.00    Average packs/day: 0.5 packs/day for 10.0 years (5.0 ttl pk-yrs)    Types: Cigarettes    Start date: 02/13/2011    Quit date: 02/12/2021    Years since quitting: 2.7   Smokeless tobacco: Never  Vaping Use   Vaping status: Never Used  Substance Use Topics   Alcohol use: Not Currently   Drug use: Not Currently   Allergies Lyrica [pregabalin], Belbuca [buprenorphine hcl], Compazine [prochlorperazine], Droperidol, and Ergotamine-caffeine  Review of Systems Review of Systems  Musculoskeletal:  Positive for arthralgias and myalgias.    Physical Exam Vital Signs  I have reviewed the triage vital signs BP (!) 103/48   Pulse 76   Temp 98.2 F (36.8 C) (Oral)   Resp 15   Ht 5\' 6"  (1.676 m)   Wt 117.9 kg   SpO2 93%   BMI 41.96 kg/m   Physical Exam Vitals and nursing note reviewed.  Constitutional:      General: She is not in acute distress.    Appearance: She is well-developed.  HENT:     Head: Normocephalic and atraumatic.  Eyes:     Conjunctiva/sclera: Conjunctivae normal.  Cardiovascular:     Rate and Rhythm: Normal rate and regular rhythm.     Heart sounds: No murmur heard. Pulmonary:  Effort: Pulmonary effort is normal. No respiratory distress.     Breath sounds: Normal breath sounds.  Abdominal:     Palpations: Abdomen is soft.     Tenderness: There is no abdominal tenderness.  Musculoskeletal:        General:  Tenderness present. No swelling.     Cervical back: Neck supple.  Skin:    General: Skin is warm and dry.     Capillary Refill: Capillary refill takes less than 2 seconds.  Neurological:     Mental Status: She is alert.  Psychiatric:        Mood and Affect: Mood normal.     ED Results and Treatments Labs (all labs ordered are listed, but only abnormal results are displayed) Labs Reviewed  COMPREHENSIVE METABOLIC PANEL - Abnormal; Notable for the following components:      Result Value   Glucose, Bld 117 (*)    Albumin 3.1 (*)    All other components within normal limits  CBC WITH DIFFERENTIAL/PLATELET                                                                                                                          Radiology DG Chest 2 View  Result Date: 11/16/2023 CLINICAL DATA:  67 year old female status post fall. EXAM: CHEST - 2 VIEW COMPARISON:  Left shoulder series today. Portable chest 11/10/2023 and earlier. FINDINGS: AP and lateral views at 1200 hours. Lung volumes are stable, within normal limits. Chronic hiatal hernia. Other mediastinal contours are within normal limits. Visualized tracheal air column is within normal limits. No pneumothorax, pulmonary edema, pleural effusion. Linear atelectasis or scarring in the left mid and lower lung appears to be chronic. Partially visible IVC filter. Paucity of bowel gas. Previous ventral abdominal hernia repair with mesh. No acute osseous abnormality identified. IMPRESSION: No acute cardiopulmonary abnormality or acute traumatic injury identified. Electronically Signed   By: Odessa Fleming M.D.   On: 11/16/2023 12:41   DG Shoulder Left  Result Date: 11/16/2023 CLINICAL DATA:  67 year old female status post fall. EXAM: LEFT SHOULDER - 2+ VIEW COMPARISON:  Chest radiographs today reported separately. FINDINGS: Two views at 1203 hours. No glenohumeral joint dislocation. Proximal left humerus appears intact. Left clavicle and scapula appear  intact. Left chest reported separately. IMPRESSION: No acute fracture or dislocation identified about the left shoulder. Electronically Signed   By: Odessa Fleming M.D.   On: 11/16/2023 12:39   DG Hip Unilat W or Wo Pelvis 2-3 Views Left  Result Date: 11/16/2023 CLINICAL DATA:  Fall. EXAM: DG HIP (WITH OR WITHOUT PELVIS) 2-3V LEFT COMPARISON:  None Available. FINDINGS: There is no evidence of hip fracture or dislocation. Mild degenerative changes of the bilateral hips. The sacroiliac joints and pubic symphysis are anatomically aligned. IMPRESSION: No acute osseous abnormality. Electronically Signed   By: Hart Robinsons M.D.   On: 11/16/2023 11:55   CT ABDOMEN PELVIS WO CONTRAST  Result Date: 11/16/2023 CLINICAL DATA:  Fall.  EXAM: CT ABDOMEN AND PELVIS WITHOUT CONTRAST TECHNIQUE: Multidetector CT imaging of the abdomen and pelvis was performed following the standard protocol without IV contrast. RADIATION DOSE REDUCTION: This exam was performed according to the departmental dose-optimization program which includes automated exposure control, adjustment of the mA and/or kV according to patient size and/or use of iterative reconstruction technique. COMPARISON:  CT abdomen/pelvis dated November 05, 2023 FINDINGS: Lower chest: Examination is slightly limited by motion artifact. Patchy opacities in the posterior left lower lobe, new since the prior exam. Similar less pronounced patchy opacity in the posterior right lower lobe. Hepatobiliary: Examination is limited due to streak artifact. No focal liver abnormality is identified. No gallstones, gallbladder wall thickening, or biliary dilatation. Pancreas: Unremarkable. No pancreatic ductal dilatation or surrounding inflammatory changes. Spleen: Unremarkable.  No focal abnormality. Adrenals/Urinary Tract: Adrenal glands are unremarkable. Unchanged 4 mm nonobstructing calculus in the distal right ureter (series 4, image 84). Punctate nonobstructing stone in the interpolar  right kidney, unchanged. No right-sided hydronephrosis. No left-sided renal calculi or hydronephrosis. Bladder is unremarkable. Stomach/Bowel: Large hiatal hernia with stable postsurgical changes of the proximal and mid stomach. The appendix is not visualized. No focal inflammatory changes. The small bowel and colon are unremarkable. Vascular/Lymphatic: No significant vascular findings are present. IVC filter in place. No enlarged abdominal or pelvic lymph nodes. Reproductive: Status post hysterectomy. No adnexal masses. Other: No abdominopelvic ascites. No intraperitoneal free air. Postsurgical changes related to ventral abdominal hernia mesh repair. Musculoskeletal: No acute osseous abnormality. Multilevel degenerative changes of the thoracolumbar spine. IMPRESSION: 1. No acute traumatic findings in the abdomen or pelvis. 2. Unchanged 4 mm nonobstructing stone in the distal right ureter. 3. Interval patchy opacities at the posterior left lower lobe and similar opacities in the posterior right lower lobe. These findings may relate to atelectasis or pneumonia. 4. Additional unchanged ancillary findings, as described above. Electronically Signed   By: Hart Robinsons M.D.   On: 11/16/2023 11:53   CT HEAD WO CONTRAST ( )  Result Date: 11/16/2023 CLINICAL DATA:  Head trauma, minor (Age >= 65y); Neck trauma (Age >= 65y) EXAM: CT HEAD WITHOUT CONTRAST CT CERVICAL SPINE WITHOUT CONTRAST TECHNIQUE: Multidetector CT imaging of the head and cervical spine was performed following the standard protocol without intravenous contrast. Multiplanar CT image reconstructions of the cervical spine were also generated. RADIATION DOSE REDUCTION: This exam was performed according to the departmental dose-optimization program which includes automated exposure control, adjustment of the mA and/or kV according to patient size and/or use of iterative reconstruction technique. COMPARISON:  None Available. FINDINGS: CT HEAD FINDINGS  Brain: No hemorrhage. No hydrocephalus. No extra-axial fluid collection. No CT evidence of an acute cortical infarct. No mass effect. No mass lesion. Vascular: No hyperdense vessel or unexpected calcification. Skull: Normal. Negative for fracture or focal lesion. Sinuses/Orbits: No middle ear or mastoid effusion. Paranasal sinuses are clear. Orbits are unremarkable. Other: None. CT CERVICAL SPINE FINDINGS Alignment: Normal. Skull base and vertebrae: No acute fracture. No primary bone lesion or focal pathologic process. Soft tissues and spinal canal: No prevertebral fluid or swelling. No visible canal hematoma. Disc levels:  No evidence high-grade spinal stenosis Upper chest: Negative. Other: None IMPRESSION: 1. No acute intracranial abnormality. 2. No acute fracture or traumatic subluxation of the cervical spine. Electronically Signed   By: Lorenza Cambridge M.D.   On: 11/16/2023 11:31   CT CERVICAL SPINE WO CONTRAST  Result Date: 11/16/2023 CLINICAL DATA:  Head trauma, minor (Age >= 65y); Neck trauma (Age >=  65y) EXAM: CT HEAD WITHOUT CONTRAST CT CERVICAL SPINE WITHOUT CONTRAST TECHNIQUE: Multidetector CT imaging of the head and cervical spine was performed following the standard protocol without intravenous contrast. Multiplanar CT image reconstructions of the cervical spine were also generated. RADIATION DOSE REDUCTION: This exam was performed according to the departmental dose-optimization program which includes automated exposure control, adjustment of the mA and/or kV according to patient size and/or use of iterative reconstruction technique. COMPARISON:  None Available. FINDINGS: CT HEAD FINDINGS Brain: No hemorrhage. No hydrocephalus. No extra-axial fluid collection. No CT evidence of an acute cortical infarct. No mass effect. No mass lesion. Vascular: No hyperdense vessel or unexpected calcification. Skull: Normal. Negative for fracture or focal lesion. Sinuses/Orbits: No middle ear or mastoid effusion.  Paranasal sinuses are clear. Orbits are unremarkable. Other: None. CT CERVICAL SPINE FINDINGS Alignment: Normal. Skull base and vertebrae: No acute fracture. No primary bone lesion or focal pathologic process. Soft tissues and spinal canal: No prevertebral fluid or swelling. No visible canal hematoma. Disc levels:  No evidence high-grade spinal stenosis Upper chest: Negative. Other: None IMPRESSION: 1. No acute intracranial abnormality. 2. No acute fracture or traumatic subluxation of the cervical spine. Electronically Signed   By: Lorenza Cambridge M.D.   On: 11/16/2023 11:31    Pertinent labs & imaging results that were available during my care of the patient were reviewed by me and considered in my medical decision making (see MDM for details).  Medications Ordered in ED Medications  lactated ringers bolus 1,000 mL (0 mLs Intravenous Stopped 11/16/23 1326)  naloxone [facility]) injection 0.4 mg (0.4 mg Intravenous Given 11/16/23 0950)  ondansetron (ZOFRAN) injection 4 mg (4 mg Intravenous Given 11/16/23 0949)  ketorolac (TORADOL) 15 MG/ML injection 15 mg (15 mg Intramuscular Given 11/16/23 1341)                                                                                                                                     Procedures .Critical Care  Performed by: Glendora Score, MD Authorized by: Glendora Score, MD   Critical care provider statement:    Critical care time (minutes):  30   Critical care was necessary to treat or prevent imminent or life-threatening deterioration of the following conditions:  Toxidrome   Critical care was time spent personally by me on the following activities:  Development of treatment plan with patient or surrogate, discussions with consultants, evaluation of patient's response to treatment, examination of patient, ordering and review of laboratory studies, ordering and review of radiographic studies, ordering and performing treatments and interventions, pulse  oximetry, re-evaluation of patient's condition and review of old charts   (including critical care time)  Medical Decision Making / ED Course   This patient presents to the ED for concern of somnolence, hypoxia, hypotension, fall, this involves an extensive number of treatment options, and is a complaint that carries with it a high risk of complications  and morbidity.  The differential diagnosis includes unintentional opioid overdose, medication side effect, fracture, hematoma, contusion, ligamentous injury, dislocation, intrathoracic injury, intra-abdominal injury, ICH, skull fracture  MDM: Patient seen in the emergency room for evaluation of a fall.  Physical exam reveals an excessively somnolent patient falling asleep during my exam with mild tenderness in the shoulder, left hip, left lower quadrant of the abdomen.  Laboratory evaluation unremarkable.  Patient received 0.4 mg of Narcan and had improvement in her vital signs and was ultimately able to be weaned off of supplemental oxygen.  Trauma imaging reassuringly negative and shows an unchanged 4 mm nonobstructing stone in the distal right ureter but patient has a normal creatinine.  Interval patchy opacities at the posterior left lower lobe and similar opacities in the posterior right lower lobe either atelectasis or pneumonia.  As patient has not had a fever, cough and hypoxia improved after reversal of opioids, I have lower suspicion that this is a true pneumonia today.  X-ray of the shoulder and hip unremarkable.  Chest x-ray unremarkable.  On reevaluation, patient states her symptoms have improved and vital signs have improved from initial presentation with hypotension resolved and hypoxia resolved.  I did request the patient do not take any "muscle relaxing" medication (especially ones that are antihistamines) in combination with her opiates as she will have polypharmacy as seen today.  At this time with negative trauma workup and vital signs  improved she currently does not meet inpatient criteria for admission and will be discharged with outpatient follow-up.  Return precautions given which she voiced understanding   Additional history obtained:  -External records from outside source obtained and reviewed including: Chart review including previous notes, labs, imaging, consultation notes   Lab Tests: -I ordered, reviewed, and interpreted labs.   The pertinent results include:   Labs Reviewed  COMPREHENSIVE METABOLIC PANEL - Abnormal; Notable for the following components:      Result Value   Glucose, Bld 117 (*)    Albumin 3.1 (*)    All other components within normal limits  CBC WITH DIFFERENTIAL/PLATELET     Imaging Studies ordered: I ordered imaging studies including CT head, C-spine, abdomen pelvis, chest x-ray, shoulder x-ray, hip x-ray I independently visualized and interpreted imaging. I agree with the radiologist interpretation   Medicines ordered and prescription drug management: Meds ordered this encounter  Medications   lactated ringers bolus 1,000 mL   naloxone (NARCAN) injection 0.4 mg   ondansetron (ZOFRAN) injection 4 mg   ondansetron (ZOFRAN) 4 MG/2ML injection    Eanes, Morgan P: cabinet override   ketorolac (TORADOL) 15 MG/ML injection 15 mg   DISCONTD: ketorolac (TORADOL) 30 MG/ML injection 15 mg    -I have reviewed the patients home medicines and have made adjustments as needed  Critical interventions Narcan, fluids    Cardiac Monitoring: The patient was maintained on a cardiac monitor.  I personally viewed and interpreted the cardiac monitored which showed an underlying rhythm of: NSR  Social Determinants of Health:  Factors impacting patients care include: none   Reevaluation: After the interventions noted above, I reevaluated the patient and found that they have :improved  Co morbidities that complicate the patient evaluation  Past Medical History:  Diagnosis Date   Acute  respiratory failure with hypoxia (HCC) 03/23/2022   Acute respiratory failure with hypoxia (HCC) 03/23/2022   Anemia    Anxiety    Arthritis    CAD (coronary artery disease)    pt deneies  COPD (chronic obstructive pulmonary disease) (HCC)    Depression    Dyspnea    Fibromyalgia    Generalized anxiety disorder 12/11/2022   GERD (gastroesophageal reflux disease)    Hiatal hernia    History of kidney stones    Lung nodule 03/24/2022   Migraine headache    Multinodular goiter    Nephrolithiasis    Obstructive sleep apnea syndrome 07/01/2021   Opioid dependence (HCC) 03/23/2022   Pneumonia    Pulmonary embolism (HCC) 1996   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

## 2023-11-18 NOTE — Progress Notes (Signed)
Name: Norma Anthony DOB: [birth year] MRN: [id number]  History of Present Illness: Ms. Norma Anthony is a 67 y.o. female who presents today as a new patient at [facility]. All available relevant medical records have been reviewed. She is accompanied by her husband Norma Anthony. - GU History: 1. Kidney stones. - Previously followed by [facility] at [facility]. - 09/09/2020: Underwent right ureteroscopic stone manipulation. Stone analysis showed calcium oxalate monohydrate composition.  Recent history: > 11/05/2023: Seen in ER for right flank pain and gross hematuria with blood clots in urine. - CMP with normal renal function (GFR >60; creatinine 0.69). CBC with no leukocytosis (WBC 7.5). UA showed >50 RBC/hpf with no evidence of UTI. CT showed a non-obstructing 3 mm distal right ureteral stone approximately 3 cm proximal to the UVJ. No hydronephrosis or hydroureter.   She was discharged with prescriptions for Flomax. "Patient reports that she has oxycodone at home and does not need additional pain management."  > 11/16/2023:  - Seen in ER for pain s/p fall on 11/10/2023. - CT abdomen/pelvis w/o contrast showed "Unchanged 4 mm nonobstructing stone in the distal right ureter.   Today: She denies passing the stone. She reports "pain all over" since her recent fall, including flank pain. She reports fevers, nausea, or vomiting.  She denies increased urinary urgency, frequency, nocturia, dysuria, gross hematuria, hesitancy, straining to void, or sensations of incomplete emptying.  She has had 5 orthopedic surgeries this year for failed left knee replacement with infection; states she is taking Keflex daily for 1 year for that. She reports using a Purewick at home intermittently because she is fearful of falling due to her current ambulatory dysfunction + Xarelto use.  She is planning to travel to [place] 11/23/2023 - 12/04/2023.   Fall Screening: Do you usually have a device  to assist in your mobility? Yes   Medications: Current Outpatient Medications  Medication Sig Dispense Refill   albuterol (VENTOLIN HFA) 108 (90 Base) MCG/ACT inhaler Inhale 2 puffs into the lungs every 6 (six) hours as needed for wheezing or shortness of breath.     ALPRAZolam (XANAX) 1 MG tablet Take 1 tablet (1 mg total) by mouth every 6 (six) hours as needed for anxiety or sleep. 30 tablet 0   ascorbic acid (VITAMIN C) 1000 MG tablet Take 1,000 mg by mouth daily.     b complex vitamins capsule Take 1 capsule by mouth daily.     cephALEXin (KEFLEX) 500 MG capsule Take 1 capsule (500 mg total) by mouth 2 (two) times daily. 60 capsule 11   cholecalciferol (VITAMIN D3) 25 MCG (1000 UNIT) tablet Take 5,000 Units by mouth daily.     clotrimazole-betamethasone (LOTRISONE) cream Apply 1 Application topically 2 (two) times daily as needed (for irritation under the breasts or abdominal folds).     DULoxetine (CYMBALTA) 60 MG capsule Take 60 mg by mouth at bedtime.     famotidine (PEPCID) 20 MG tablet TAKE 1 TABLET BY MOUTH AFTER SUPPER (Patient taking differently: Take 20 mg by mouth at bedtime.) 30 tablet 0   Ferrous Fumarate-Folic Acid 324-1 MG TABS Take 1 tablet by mouth daily. Hematinic     fluconazole (DIFLUCAN) 200 MG tablet Take 200 mg by mouth daily.     gabapentin (NEURONTIN) 400 MG capsule Take 400 mg by mouth 3 (three) times daily.     meclizine (ANTIVERT) 25 MG tablet Take 25 mg by mouth every 8 (eight) hours as needed.  naloxone (NARCAN) nasal spray 4 mg/0.1 mL Place 1 spray into the nose once as needed (accidental over dose).     nutrition supplement, JUVEN, (JUVEN) PACK Take 1 packet by mouth 2 (two) times daily between meals.     ondansetron (ZOFRAN) 4 MG tablet Take 1 tablet (4 mg total) by mouth every 8 (eight) hours as needed for nausea or vomiting. 30 tablet 0   oxyCODONE (ROXICODONE) 5 MG immediate release tablet Take 1 tablet (5 mg total) by mouth every 4 (four) hours as  needed for severe pain. 42 tablet 0   pantoprazole (PROTONIX) 40 MG tablet Take 40 mg by mouth daily with breakfast.     promethazine (PHENERGAN) 25 MG tablet Take 25 mg by mouth every 6 (six) hours as needed for nausea or vomiting.     XARELTO 20 MG TABS tablet Take 20 mg by mouth at bedtime.     zinc gluconate 50 MG tablet Take 50 mg by mouth daily.     tamsulosin (FLOMAX) 0.4 MG CAPS capsule Take 1 capsule (0.4 mg total) by mouth daily. 30 capsule 0   No current facility-administered medications for this visit.    Allergies: Allergies  Allergen Reactions   Lyrica [Pregabalin] Other (See Comments)    Paralysis present (finding)- not tolerated Pt stated she could not move her body but she could think      Belbuca [Buprenorphine Hcl] Other (See Comments)    " Horrible headache"    Compazine [Prochlorperazine] Anxiety   Droperidol Anxiety   Ergotamine-Caffeine Anxiety and Hypertension    Increased systolic arterial pressure (finding)     Past Medical History:  Diagnosis Date   Acute respiratory failure with hypoxia (HCC) 03/23/2022   Acute respiratory failure with hypoxia (HCC) 03/23/2022   Anemia    Anxiety    Arthritis    CAD (coronary artery disease)    pt deneies   COPD (chronic obstructive pulmonary disease) (HCC)    Depression    Dyspnea    Fibromyalgia    Generalized anxiety disorder 12/11/2022   GERD (gastroesophageal reflux disease)    Hiatal hernia    History of kidney stones    Lung nodule 03/24/2022   Migraine headache    Multinodular goiter    Nephrolithiasis    Obstructive sleep apnea syndrome 07/01/2021   Opioid dependence (HCC) 03/23/2022   Pneumonia    Pulmonary embolism (HCC) 1996   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   Sleep apnea    Thyroid nodule    Past Surgical History:  Procedure Laterality  Date   ABDOMINAL HYSTERECTOMY     BIOPSY THYROID     EXCISIONAL TOTAL KNEE ARTHROPLASTY WITH ANTIBIOTIC SPACERS Left 03/10/2023   Procedure: Irrigation and debridement left knee, resection total knee arthroplasty, antibiotic spacer placement;  Surgeon: Samson Frederic, MD;  Location: [facility];  Service: Orthopedics;  Laterality: Left;   HERNIA REPAIR     I & D EXTREMITY Left 12/11/2022   Procedure: IRRIGATION AND DEBRIDEMENT EXTREMITY;  Surgeon: Yolonda Kida, MD;  Location: [facility];  Service: Orthopedics;  Laterality: Left;   IVC FILTER INSERTION N/A 09/28/2022   Procedure: IVC FILTER INSERTION;  Surgeon: Maeola Harman, MD;  Location: [facility] INVASIVE CV LAB;  Service: Cardiovascular;  Laterality: N/A;   IVC FILTER INSERTION N/A 08/09/2023   Procedure: IVC FILTER  INSERTION;  Surgeon: Maeola Harman, MD;  Location: [facility] INVASIVE CV LAB;  Service: Cardiovascular;  Laterality: N/A;   IVC FILTER REMOVAL N/A 01/18/2023   Procedure: IVC FILTER REMOVAL;  Surgeon: Maeola Harman, MD;  Location: [facility] INVASIVE CV LAB;  Service: Cardiovascular;  Laterality: N/A;   KIDNEY STONE SURGERY     KNEE ARTHROTOMY Left 02/03/2023   Procedure: KNEE ARTHROTOMY WITH SOFT TISSUE REPAIR;  Surgeon: Samson Frederic, MD;  Location: [facility];  Service: Orthopedics;  Laterality: Left;  75   LAPAROSCOPIC NISSEN FUNDOPLICATION     PAROTIDECTOMY     REMOVAL OF CEMENTED SPACER KNEE Left 08/25/2023   Procedure: Knee spacer removal;  Surgeon: Samson Frederic, MD;  Location: [facility];  Service: Orthopedics;  Laterality: Left;  350   TONSILLECTOMY     TOTAL KNEE ARTHROPLASTY Left 11/04/2022   Procedure: TOTAL KNEE ARTHROPLASTY;  Surgeon: Jene Every, MD;  Location: [facility];  Service: Orthopedics;  Laterality: Left;   TOTAL KNEE REVISION Left 08/25/2023   Procedure: TOTAL KNEE REVISION;  Surgeon: Samson Frederic, MD;  Location: [facility];  Service: Orthopedics;  Laterality: Left;  350   Family History   Problem Relation Age of Onset   Hypertension Mother    Social History   Socioeconomic History   Marital status: Married    Spouse name: Not on file   Number of children: Not on file   Years of education: Not on file   Highest education level: Not on file  Occupational History   Not on file  Tobacco Use   Smoking status: Former    Current packs/day: 0.00    Average packs/day: 0.5 packs/day for 10.0 years (5.0 ttl pk-yrs)    Types: Cigarettes    Start date: 02/13/2011    Quit date: 02/12/2021    Years since quitting: 2.7   Smokeless tobacco: Never  Vaping Use   Vaping status: Never Used  Substance and Sexual Activity   Alcohol use: Not Currently   Drug use: Not Currently   Sexual activity: Not Currently  Other Topics Concern   Not on file  Social History Narrative   Not on file   Social Determinants of Health   Financial Resource Strain: Low Risk  (04/23/2021)   Received from [facility], [facility]   Overall Financial Resource Strain (CARDIA)    Difficulty of Paying Living Expenses: Not hard at all  Food Insecurity: No Food Insecurity (08/25/2023)   Hunger Vital Sign    Worried About Running Out of Food in the Last Year: Never true    Ran Out of Food in the Last Year: Never true  Transportation Needs: No Transportation Needs (08/25/2023)   PRAPARE - Administrator, Civil Service (Medical): No    Lack of Transportation (Non-Medical): No  Physical Activity: Not on file  Stress: Stress Concern Present (04/23/2021)   Received from [facility], [facility] Occupational [facility] - Occupational Stress Questionnaire    Feeling of Stress : To some extent  Social Connections: Unknown (04/16/2022)   Received from [facility], [facility]   Social Network    Social Network: Not on file  Intimate Partner Violence: Not At Risk (08/25/2023)   Humiliation, Afraid, Rape, and Kick questionnaire    Fear of Current or Ex-Partner: No     Emotionally Abused: No    Physically Abused: No    Sexually Abused: No    SUBJECTIVE  Review of Systems Constitutional:  Patient denies any unintentional weight loss or change in strength lntegumentary: Patient denies any rashes or pruritus Cardiovascular: Patient denies chest pain or syncope Respiratory: Patient denies shortness of breath Gastrointestinal: Patient denies nausea, vomiting, constipation, or diarrhea Musculoskeletal: As per HPI  Neurologic: Patient denies convulsions or seizures Allergic/Immunologic: Patient denies recent allergic reaction(s) Hematologic/Lymphatic: Patient denies bleeding tendencies Endocrine: Patient denies heat/cold intolerance  GU: As per HPI.  OBJECTIVE Vitals:   11/19/23 0915  BP: 128/86  Pulse: 79  Temp: 97.7 F (36.5 C)   There is no height or weight on file to calculate BMI.  Physical Examination Constitutional: No obvious distress; patient is non-toxic appearing  Cardiovascular: No visible lower extremity edema.  Respiratory: The patient does not have audible wheezing/stridor; respirations do not appear labored  Gastrointestinal: Abdomen non-distended Musculoskeletal: Normal ROM of UEs  Skin: No obvious rashes/open sores  Neurologic: CN 2-12 grossly intact Psychiatric: Answered questions appropriately with normal affect  Hematologic/Lymphatic/Immunologic: No obvious bruises or sites of spontaneous bleeding  UA: Patient declined; did not feel sensation of needing to void during visit PVR: 1 ml  ASSESSMENT Kidney stones - Plan: Urinalysis, Routine w reflex microscopic, BLADDER SCAN AMB NON-IMAGING, Ambulatory Referral For Surgery Scheduling, tamsulosin (FLOMAX) 0.4 MG CAPS capsule, DG Abd 1 View, DG Abd 1 View  Right ureteral stone - Plan: Urinalysis, Routine w reflex microscopic, BLADDER SCAN AMB NON-IMAGING, Ambulatory Referral For Surgery Scheduling, tamsulosin (FLOMAX) 0.4 MG CAPS capsule, DG Abd 1 View, DG Abd 1 View  Right  flank pain  Ambulatory dysfunction  Functional urinary incontinence  We reviewed recent history and imaging results; she remains symptomatic and the distal right ureteral stone has not progressed for >2 weeks despite medical expulsive therapy with Flomax.   We discussed the various treatment options including another 2 weeks of medical expulsive therapy versus proceeding with extracorporeal shock wave lithotripsy (ESWL). We discussed possible risks and benefits of intervention including but not limited to: including pain, infection, sepsis, UTI, ureter perforation, need for stenting, post-op ureteral stricture, hematuria.   She elected to proceed with ESWL. Surgery request submitted.  Advised KUB today to confirm stone is radiopaque. Advised to continue Flomax 0.4 mg daily in the meantime.  Agreed to repeat KUB 1-2 days prior to surgery to see if stone has passed (if so will cancel procedure). Can continue Oxycodone as prescribed elsewhere for pain management.  She was unable to provide UA today; considered to be low risk for UTI due to continuous Keflex prophylaxis for her orthopedic issues.  She was advised to contact urology provider or go to the ER if She develops fever >101F, uncontrollable pain, or other significantly concerning symptoms prior to next office visit.  She verbalized understanding and agreement. All questions were answered.   PLAN Advised the following: KUB today. Continue Flomax 0.4 mg daily for MET. Analgesics PRN for pain. Surgery request submitted for ESWL with Dr. Ronne Binning. KUB 1-2 days prior.    Orders Placed This Encounter  Procedures   DG Abd 1 View    Standing Status:   Future    Standing Expiration Date:   11/18/2024    Order Specific Question:   Reason for Exam (SYMPTOM  OR DIAGNOSIS REQUIRED)    Answer:   kidney stone    Order Specific Question:   Preferred imaging location?    Answer:   [facility]   DG Abd 1 View    Standing Status:    Future    Standing Expiration  Date:   11/18/2024    Order Specific Question:   Reason for Exam (SYMPTOM  OR DIAGNOSIS REQUIRED)    Answer:   kidney stone    Order Specific Question:   Preferred imaging location?    Answer:   [facility]   Urinalysis, Routine w reflex microscopic   Ambulatory Referral For Surgery Scheduling    Referral Priority:   Routine    Referral Type:   Consultation    Number of Visits Requested:   1   BLADDER SCAN AMB NON-IMAGING    It has been explained that the patient is to follow regularly with their PCP in addition to all other providers involved in their care and to follow instructions provided by these respective offices. Patient advised to contact urology clinic if any urologic-pertaining questions, concerns, new symptoms or problems arise in the interim period.  There are no Patient Instructions on file for this visit.  Electronically signed by: Donnita Falls, MSN, FNP-C, CUNP 11/19/2023 10:37 AM

## 2023-11-19 ENCOUNTER — Ambulatory Visit: Payer: Medicare Other | Admitting: Urology

## 2023-11-19 ENCOUNTER — Ambulatory Visit (HOSPITAL_COMMUNITY)
Admission: RE | Admit: 2023-11-19 | Discharge: 2023-11-19 | Disposition: A | Discharge status: Home / Self Care | Payer: Medicare Other | Source: Ambulatory Visit | Attending: Urology | Admitting: Urology

## 2023-11-19 ENCOUNTER — Ambulatory Visit (INDEPENDENT_AMBULATORY_CARE_PROVIDER_SITE_OTHER): Payer: Medicare Other | Admitting: Urology

## 2023-11-19 ENCOUNTER — Encounter: Payer: Self-pay | Admitting: Urology

## 2023-11-19 ENCOUNTER — Other Ambulatory Visit: Payer: Self-pay

## 2023-11-19 VITALS — BP 128/86 | HR 79 | Temp 97.7°F

## 2023-11-19 DIAGNOSIS — N201 Calculus of ureter: Secondary | ICD-10-CM | POA: Diagnosis present

## 2023-11-19 DIAGNOSIS — N200 Calculus of kidney: Secondary | ICD-10-CM

## 2023-11-19 DIAGNOSIS — R109 Unspecified abdominal pain: Secondary | ICD-10-CM

## 2023-11-19 DIAGNOSIS — R3981 Functional urinary incontinence: Secondary | ICD-10-CM

## 2023-11-19 DIAGNOSIS — R262 Difficulty in walking, not elsewhere classified: Secondary | ICD-10-CM | POA: Insufficient documentation

## 2023-11-19 MED ORDER — TAMSULOSIN HCL 0.4 MG PO CAPS: 0.4000 mg | ORAL_CAPSULE | Freq: Every day | ORAL | 0 refills | Status: DC

## 2023-12-06 ENCOUNTER — Emergency Department (HOSPITAL_COMMUNITY)
Admission: EM | Admit: 2023-12-06 | Discharge: 2023-12-06 | Disposition: A | Discharge status: Home / Self Care | Payer: Medicare Other | Attending: Emergency Medicine | Admitting: Emergency Medicine

## 2023-12-06 ENCOUNTER — Emergency Department (HOSPITAL_COMMUNITY): Payer: Medicare Other

## 2023-12-06 ENCOUNTER — Encounter (HOSPITAL_COMMUNITY): Payer: Self-pay | Admitting: *Deleted

## 2023-12-06 ENCOUNTER — Other Ambulatory Visit: Payer: Self-pay

## 2023-12-06 DIAGNOSIS — G8929 Other chronic pain: Secondary | ICD-10-CM | POA: Diagnosis not present

## 2023-12-06 DIAGNOSIS — Y92009 Unspecified place in unspecified non-institutional (private) residence as the place of occurrence of the external cause: Secondary | ICD-10-CM | POA: Diagnosis not present

## 2023-12-06 DIAGNOSIS — Z7901 Long term (current) use of anticoagulants: Secondary | ICD-10-CM | POA: Insufficient documentation

## 2023-12-06 DIAGNOSIS — M25562 Pain in left knee: Secondary | ICD-10-CM | POA: Diagnosis present

## 2023-12-06 DIAGNOSIS — J449 Chronic obstructive pulmonary disease, unspecified: Secondary | ICD-10-CM | POA: Insufficient documentation

## 2023-12-06 DIAGNOSIS — W19XXXA Unspecified fall, initial encounter: Secondary | ICD-10-CM | POA: Diagnosis not present

## 2023-12-06 DIAGNOSIS — I2510 Atherosclerotic heart disease of native coronary artery without angina pectoris: Secondary | ICD-10-CM | POA: Insufficient documentation

## 2023-12-06 LAB — URINALYSIS, ROUTINE W REFLEX MICROSCOPIC
Bilirubin Urine: NEGATIVE
Glucose, UA: NEGATIVE mg/dL
Hgb urine dipstick: NEGATIVE
Ketones, ur: NEGATIVE mg/dL
Leukocytes,Ua: NEGATIVE
Nitrite: NEGATIVE
Protein, ur: NEGATIVE mg/dL
Specific Gravity, Urine: 1.017 (ref 1.005–1.030)
pH: 7 (ref 5.0–8.0)

## 2023-12-06 MED ORDER — OXYCODONE-ACETAMINOPHEN 5-325 MG PO TABS
1.0000 | ORAL_TABLET | Freq: Once | ORAL | Status: AC
  Administered 2023-12-06: 1 via ORAL
  Filled 2023-12-06: qty 1

## 2023-12-06 NOTE — ED Notes (Signed)
Patient transferred to bedside commode x2 person assist for a bowel movement. Patient feeling weak, however states "this is how I move around at home".

## 2023-12-06 NOTE — Discharge Instructions (Addendum)
Your x-rays are negative for any acute new injuries.  There is also no evidence of a continued kidney stone, however keep in mind that plain film imaging is not as sensitive at picking up stones.  Plan to follow-up with Dr. Ronne Binning regarding this known stone per his recommendations.  There is no infection in your bladder today.

## 2023-12-06 NOTE — ED Triage Notes (Addendum)
Pt brought in from home with c/o fall today. Pt c/o pain to back, bilateral knees and shoulders. Pt reports she "slid" down instead of falling while trying to get up to her bedside commode. Denies hitting her head. O2 sat 90% on RA, HR 112, BP 128/68 per EMS. Pt reports use of Xarelto. Denies LOC upon fall.

## 2023-12-06 NOTE — ED Provider Notes (Signed)
[place] EMERGENCY DEPARTMENT AT [facility] Provider Note   CSN: [id number] Arrival date & time: 12/06/23  1834     History  Chief Complaint  Patient presents with   [facility] Ockerman is a 67 y.o. female with a history including COPD, history of PE on Xaralto, kidney stones, fibromyalgia, CAD, presenting for evaluation of a controlled fall in her home prior to arrival.  She has been struggling with increased generalized weakness and deconditioning from multiple surgeries involving her left knee.  She had a left arthroplasty of this knee last year with complications of infection requiring subsequent revisions, most recently about 3 months ago.  Since then she has had difficulty with ambulation and general weakness.  She under the care of Dr. Linna Caprice and is scheduled to return to outpatient rehab next week.  Today she was trying to get back into her bed after using her bedside commode and was unable to get into the bed.  When her grandson got in the room she was leaned over the bed but her legs were in a split position and she was unable to move.  He helped her to the floor gently, they both concur that she did not actually fall on the floor.  She does have increased pain in her left knee since this event.    She also endorses some dysuria which has been intermittent.  She has a 5 mm kidney stone that she is scheduled for lithotripsy next week.  She suspects the stone may have passed into her bladder but to her knowledge she has not completely passed it.   The history is provided by the patient, the spouse and a relative (grandson in room).       Home Medications Prior to Admission medications   Medication Sig Start Date End Date Taking? Authorizing Provider  morphine (MSIR) 15 MG tablet Take 15 mg by mouth 3 (three) times daily as needed. 12/06/23  Yes [provider]  albuterol (VENTOLIN HFA) 108 (90 Base) MCG/ACT inhaler Inhale 2 puffs into the lungs every 6  (six) hours as needed for wheezing or shortness of breath. 05/12/21   [provider]  ALPRAZolam Prudy Feeler) 1 MG tablet Take 1 tablet (1 mg total) by mouth every 6 (six) hours as needed for anxiety or sleep. 03/16/23   Cassandria Anger, PA-C  ascorbic acid (VITAMIN C) 1000 MG tablet Take 1,000 mg by mouth daily.    [provider]  b complex vitamins capsule Take 1 capsule by mouth daily.    [provider]  cephALEXin (KEFLEX) 500 MG capsule Take 1 capsule (500 mg total) by mouth 2 (two) times daily. 08/31/23 08/25/24  Clois Dupes, PA-C  cholecalciferol (VITAMIN D3) 25 MCG (1000 UNIT) tablet Take 5,000 Units by mouth daily.    [provider]  clotrimazole-betamethasone (LOTRISONE) cream Apply 1 Application topically 2 (two) times daily as needed (for irritation under the breasts or abdominal folds).    [provider]  DULoxetine (CYMBALTA) 60 MG capsule Take 60 mg by mouth at bedtime. 09/14/18   [provider]  famotidine (PEPCID) 20 MG tablet TAKE 1 TABLET BY MOUTH AFTER SUPPER Patient taking differently: Take 20 mg by mouth at bedtime. 04/01/23   [name], MD  Ferrous Fumarate-Folic Acid 324-1 MG TABS Take 1 tablet by mouth daily. Hematinic 02/29/20   [provider]  fluconazole (DIFLUCAN) 200 MG tablet Take 200 mg by mouth daily. 06/25/23  [provider]  gabapentin (NEURONTIN) 400 MG capsule Take 400 mg by mouth 3 (three) times daily. 08/21/22   [provider]  meclizine (ANTIVERT) 25 MG tablet Take 25 mg by mouth every 8 (eight) hours as needed. 09/22/23   [provider]  naloxone [facility]) nasal spray 4 mg/0.1 mL Place 1 spray into the nose once as needed (accidental over dose). 10/16/21   [provider]  nutrition supplement, JUVEN, (JUVEN) PACK Take 1 packet by mouth 2 (two) times daily between meals.    [provider]  ondansetron (ZOFRAN) 4 MG tablet Take 1 tablet (4 mg total) by  mouth every 8 (eight) hours as needed for nausea or vomiting. 08/31/23 08/30/24  Clois Dupes, PA-C  oxyCODONE (ROXICODONE) 5 MG immediate release tablet Take 1 tablet (5 mg total) by mouth every 4 (four) hours as needed for severe pain. 08/31/23   Hill, Alain Honey, PA-C  pantoprazole (PROTONIX) 40 MG tablet Take 40 mg by mouth daily with breakfast. 06/30/16   [provider]  promethazine (PHENERGAN) 25 MG tablet Take 25 mg by mouth every 6 (six) hours as needed for nausea or vomiting.    [provider]  tamsulosin (FLOMAX) 0.4 MG CAPS capsule Take 1 capsule (0.4 mg total) by mouth daily. 11/19/23   Larocco, Eleonore Chiquito, FNP  XARELTO 20 MG TABS tablet Take 20 mg by mouth at bedtime. 08/21/21   [provider]  zinc gluconate 50 MG tablet Take 50 mg by mouth daily.    [provider]      Allergies    Pregabalin, Belbuca [buprenorphine hcl], Compazine [prochlorperazine], Droperidol, and Ergotamine-caffeine    Review of Systems   Review of Systems  Constitutional:  Negative for chills and fever.  Respiratory: Negative.    Cardiovascular: Negative.   Musculoskeletal:  Positive for arthralgias. Negative for joint swelling and myalgias.  Neurological:  Negative for weakness and numbness.    Physical Exam Updated Vital Signs BP (!) 143/119   Pulse 85   Temp 99.1 F (37.3 C) (Oral)   Resp 16   Ht 5\' 6"  (1.676 m)   Wt 117.9 kg   SpO2 96%   BMI 41.97 kg/m  Physical Exam Vitals and nursing note reviewed.  Constitutional:      Appearance: She is well-developed.  HENT:     Head: Normocephalic and atraumatic.  Eyes:     Conjunctiva/sclera: Conjunctivae normal.  Cardiovascular:     Rate and Rhythm: Normal rate and regular rhythm.     Heart sounds: Normal heart sounds.  Pulmonary:     Effort: Pulmonary effort is normal.     Breath sounds: Normal breath sounds. No wheezing.  Abdominal:     General: Bowel sounds are normal.     Palpations: Abdomen is soft.      Tenderness: There is no abdominal tenderness.  Musculoskeletal:        General: Tenderness present. No deformity. Normal range of motion.     Cervical back: Normal range of motion.     Left knee: Tenderness present over the medial joint line and lateral joint line.     Comments: Well-healed surgical incision  Skin:    General: Skin is warm and dry.  Neurological:     Mental Status: She is alert.     ED Results / Procedures / Treatments   Labs (all labs ordered are listed, but only abnormal results are displayed) Labs Reviewed  URINALYSIS, ROUTINE W REFLEX  MICROSCOPIC    EKG None  Radiology DG Lumbar Spine Complete Result Date: 12/06/2023 CLINICAL DATA:  Status post fall. EXAM: LUMBAR SPINE - COMPLETE 4+ VIEW COMPARISON:  November 10, 2023 FINDINGS: There is no evidence of an acute lumbar spine fracture. Alignment is normal. Moderate severity multilevel endplate sclerosis is seen throughout the lumbar spine moderate to marked severity intervertebral disc space narrowing is seen at the levels of L1-L2, L2-L3 and L5-S1. IMPRESSION: Moderate to marked severity multilevel degenerative disc disease. Electronically Signed   By: Aram Candela M.D.   On: 12/06/2023 21:21   DG Knee Complete 4 Views Left Result Date: 12/06/2023 CLINICAL DATA:  Knee pain. EXAM: LEFT KNEE - COMPLETE 4+ VIEW COMPARISON:  None Available. FINDINGS: No evidence of an acute fracture, dislocation, or joint effusion. An intact left knee replacement is noted. There is mild to moderate severity diffuse soft tissue swelling which may be, in part, related to the patient's body habitus. IMPRESSION: 1. Intact left knee replacement. 2. Mild to moderate severity diffuse soft tissue swelling. Electronically Signed   By: Aram Candela M.D.   On: 12/06/2023 21:16   DG Abdomen 1 View Result Date: 12/06/2023 CLINICAL DATA:  Assess location of 5 mm kidney stone. EXAM: ABDOMEN - 1 VIEW COMPARISON:  November 19, 2023 FINDINGS:  The bowel gas pattern is normal. A large stool burden is noted. Numerous radiopaque surgical coils are seen overlying the mid to upper left abdomen. A radiopaque IVC filter is in place. No radio-opaque calculi or other significant radiographic abnormality are seen. IMPRESSION: 1. Large stool burden without evidence of bowel obstruction. 2. No evidence of renal calculi. Electronically Signed   By: Aram Candela M.D.   On: 12/06/2023 21:15    Procedures Procedures    Medications Ordered in ED Medications  oxyCODONE-acetaminophen (PERCOCET/ROXICET) 5-325 MG per tablet 1 tablet (1 tablet Oral Given 12/06/23 2003)    ED Course/ Medical Decision Making/ A&P                                 Medical Decision Making Patient presenting with a near fall who endorses difficulty with ambulation secondary to pain in her knees and deconditioning secondary to reduced mobility.  She is scheduled for outpatient rehab starting next week under the care of Dr. Linna Caprice.  She has what sounds like appropriate equipment in her home to assist her with ambulation.  We discussed more formal inpatient rehab as she expressed fears about using her walker as she has had falls using this device and is somewhat afraid to go home.  She does have good assistance with her husband and grandson who also lives with her.  She is not interested in inpatient rehab stating she has done this before and was not happy with that arrangement.  She will reach out to Dr. Linna Caprice to schedule her first outpatient rehab the first of next week.  Of note she has been just seen her new pain management physician Dr. Horald Chestnut today who has started her on morphine tablets.  Amount and/or Complexity of Data Reviewed Labs: ordered.    Details: UA negative Radiology: ordered.    Details: Imaging reviewed and negative for acute injury.  Risk Prescription drug management.           Final Clinical Impression(s) / ED Diagnoses Final  diagnoses:  Chronic pain of left knee  Fall, initial encounter    Rx /  DC Orders ED Discharge Orders     None         Victoriano Lain 12/06/23 2155    Loetta Rough, MD 12/06/23 [phone]

## 2023-12-10 ENCOUNTER — Ambulatory Visit (HOSPITAL_COMMUNITY)
Admission: RE | Admit: 2023-12-10 | Discharge: 2023-12-10 | Disposition: A | Discharge status: Home / Self Care | Payer: Medicare Other | Source: Ambulatory Visit | Attending: Urology | Admitting: Urology

## 2023-12-10 ENCOUNTER — Encounter (HOSPITAL_COMMUNITY)
Admission: RE | Admit: 2023-12-10 | Discharge: 2023-12-10 | Disposition: A | Discharge status: Home / Self Care | Payer: Medicare Other | Source: Ambulatory Visit | Attending: Urology

## 2023-12-10 DIAGNOSIS — N200 Calculus of kidney: Secondary | ICD-10-CM | POA: Diagnosis present

## 2023-12-10 DIAGNOSIS — N201 Calculus of ureter: Secondary | ICD-10-CM | POA: Insufficient documentation

## 2023-12-13 ENCOUNTER — Telehealth: Payer: Self-pay | Admitting: Urology

## 2023-12-13 NOTE — Telephone Encounter (Signed)
Please review and I will notify patient of your response regarding necessity of litho scheduled for 12/31

## 2023-12-13 NOTE — Telephone Encounter (Signed)
Patient's husband called, I relayed the same information to pt's husband.  They will keep lithotripsy and discuss imaging with MD prior.

## 2023-12-13 NOTE — Telephone Encounter (Signed)
Patients husband called regarding his wife xray , did she pass the stone?  If so can they cancel the lithotripsy?

## 2023-12-13 NOTE — Telephone Encounter (Signed)
FYI I informed patient that MD was out of the office and NP has requested his review of her recent KUB.  I assured her that MD will review imaging prior to lithotripsy procedure tomorrow and would not proceed if she had passed the stone. Patient voiced understanding and will keep scheduled procedure and discuss with MD prior.

## 2023-12-14 ENCOUNTER — Ambulatory Visit: Payer: Medicare Other | Admitting: Urology

## 2023-12-14 ENCOUNTER — Ambulatory Visit (HOSPITAL_COMMUNITY)
Admission: RE | Admit: 2023-12-14 | Discharge: 2023-12-14 | Disposition: A | Discharge status: Home / Self Care | Payer: Medicare Other | Source: Home / Self Care | Attending: Urology | Admitting: Urology

## 2023-12-14 ENCOUNTER — Encounter (HOSPITAL_COMMUNITY)
Admission: RE | Disposition: A | Discharge status: Home / Self Care | Payer: Self-pay | Source: Home / Self Care | Attending: Urology

## 2023-12-14 ENCOUNTER — Ambulatory Visit (HOSPITAL_COMMUNITY)
Admission: RE | Admit: 2023-12-14 | Discharge: 2023-12-14 | Disposition: A | Discharge status: Home / Self Care | Payer: Medicare Other | Attending: Urology | Admitting: Urology

## 2023-12-14 DIAGNOSIS — N200 Calculus of kidney: Secondary | ICD-10-CM

## 2023-12-14 SURGERY — LITHOTRIPSY, ESWL
Anesthesia: LOCAL | Laterality: Right

## 2023-12-14 MED ORDER — DIPHENHYDRAMINE HCL 25 MG PO CAPS: 25.0000 mg | ORAL_CAPSULE | ORAL | Status: DC

## 2023-12-14 NOTE — Progress Notes (Signed)
 Patient arrived to Short stay.  Pre procedure KUB completed.  No stone seen by Dr Ronne Binning.  Procedure cancelled.

## 2023-12-20 ENCOUNTER — Encounter: Payer: Medicare Other | Admitting: Urology

## 2023-12-22 ENCOUNTER — Encounter: Payer: Self-pay | Admitting: Vascular Surgery

## 2023-12-22 ENCOUNTER — Ambulatory Visit: Payer: Medicare Other | Admitting: Vascular Surgery

## 2023-12-22 VITALS — BP 110/75 | HR 79 | Temp 97.7°F

## 2023-12-22 DIAGNOSIS — Z95828 Presence of other vascular implants and grafts: Secondary | ICD-10-CM | POA: Diagnosis not present

## 2023-12-22 NOTE — Progress Notes (Signed)
 Patient ID: Norma Anthony, female   DOB: May 25, 1956, 68 y.o.   MRN: [id number]  Reason for Consult: Follow-up   Referred by Clinic, [facility]  Subjective:     HPI:  Norma Anthony is a 68 y.o. female history of strong personal and family history of thrombophilia.  She has had multiple DVTs and at least 1 previous pulmonary embolus and remains on Xarelto .  She has a previous IVC filter placement for left total knee arthroplasty which was then unfortunately removed and then replaced for revision surgery.  She has not healed the left total knee arthroplasty.  She has had weakness particularly of the right knee but the left knee is now the better knee but she was unable to complete therapy due to pneumonia and she has also had kidney stone when she was on vacation in [place] .  She is now here to discuss filter removal.  She has mostly a wheelchair now due to several falls recently.  She remains on anticoagulation.  Past Medical History:  Diagnosis Date   Acute respiratory failure with hypoxia (HCC) 03/23/2022   Acute respiratory failure with hypoxia (HCC) 03/23/2022   Anemia    Anxiety    Arthritis    CAD (coronary artery disease)    pt deneies   COPD (chronic obstructive pulmonary disease) (HCC)    Depression    Dyspnea    Fibromyalgia    Generalized anxiety disorder 12/11/2022   GERD (gastroesophageal reflux disease)    Hiatal hernia    History of kidney stones    Lung nodule 03/24/2022   Migraine headache    Multinodular goiter    Nephrolithiasis    Obstructive sleep apnea syndrome 07/01/2021   Opioid dependence (HCC) 03/23/2022   Pneumonia    Pulmonary embolism (HCC) 1996   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   Sleep apnea    Thyroid  nodule    Family History  Problem Relation Age of Onset   Hypertension Mother    Past  Surgical History:  Procedure Laterality Date   ABDOMINAL HYSTERECTOMY     BIOPSY THYROID      EXCISIONAL TOTAL KNEE ARTHROPLASTY WITH ANTIBIOTIC SPACERS Left 03/10/2023   Procedure: Irrigation and debridement left knee, resection total knee arthroplasty, antibiotic spacer placement;  Surgeon: Fidel Rogue, MD;  Location: [facility];  Service: Orthopedics;  Laterality: Left;   HERNIA REPAIR     I & D EXTREMITY Left 12/11/2022   Procedure: IRRIGATION AND DEBRIDEMENT EXTREMITY;  Surgeon: Sharl Selinda Dover, MD;  Location: [facility];  Service: Orthopedics;  Laterality: Left;   IVC FILTER INSERTION N/A 09/28/2022   Procedure: IVC FILTER INSERTION;  Surgeon: Sheree Penne Bruckner, MD;  Location: [facility] INVASIVE CV LAB;  Service: Cardiovascular;  Laterality: N/A;   IVC FILTER INSERTION N/A 08/09/2023   Procedure: IVC FILTER INSERTION;  Surgeon: Sheree Penne Bruckner, MD;  Location: [facility] INVASIVE CV LAB;  Service: Cardiovascular;  Laterality: N/A;   IVC FILTER REMOVAL N/A 01/18/2023   Procedure: IVC FILTER REMOVAL;  Surgeon: Sheree Penne Bruckner, MD;  Location: [facility] INVASIVE CV LAB;  Service: Cardiovascular;  Laterality: N/A;   KIDNEY STONE SURGERY     KNEE ARTHROTOMY Left 02/03/2023   Procedure: KNEE ARTHROTOMY WITH SOFT TISSUE REPAIR;  Surgeon: Fidel Rogue, MD;  Location: [facility];  Service: Orthopedics;  Laterality: Left;  75   LAPAROSCOPIC  NISSEN FUNDOPLICATION     PAROTIDECTOMY     REMOVAL OF CEMENTED SPACER KNEE Left 08/25/2023   Procedure: Knee spacer removal;  Surgeon: Fidel Rogue, MD;  Location: [facility];  Service: Orthopedics;  Laterality: Left;  350   TONSILLECTOMY     TOTAL KNEE ARTHROPLASTY Left 11/04/2022   Procedure: TOTAL KNEE ARTHROPLASTY;  Surgeon: Duwayne Purchase, MD;  Location: [facility];  Service: Orthopedics;  Laterality: Left;   TOTAL KNEE REVISION Left 08/25/2023   Procedure: TOTAL KNEE REVISION;  Surgeon: Fidel Rogue, MD;  Location: [facility];  Service: Orthopedics;   Laterality: Left;  350    Short Social History:  Social History   Tobacco Use   Smoking status: Former    Current packs/day: 0.00    Average packs/day: 0.5 packs/day for 10.0 years (5.0 ttl pk-yrs)    Types: Cigarettes    Start date: 02/13/2011    Quit date: 02/12/2021    Years since quitting: 2.8   Smokeless tobacco: Never  Substance Use Topics   Alcohol  use: Not Currently    Allergies  Allergen Reactions   Pregabalin Other (See Comments)    Paralysis present (finding)- not tolerated  Pt stated she could not move her body but she could think   Belbuca  [Buprenorphine  Hcl] Other (See Comments)     Horrible headache    Compazine  [Prochlorperazine ] Anxiety   Droperidol Anxiety   Ergotamine-Caffeine Anxiety and Hypertension    Increased systolic arterial pressure (finding)     Current Outpatient Medications  Medication Sig Dispense Refill   albuterol  (VENTOLIN  HFA) 108 (90 Base) MCG/ACT inhaler Inhale 2 puffs into the lungs every 6 (six) hours as needed for wheezing or shortness of breath.     ALPRAZolam  (XANAX ) 1 MG tablet Take 1 tablet (1 mg total) by mouth every 6 (six) hours as needed for anxiety or sleep. 30 tablet 0   ascorbic acid  (VITAMIN C ) 1000 MG tablet Take 1,000 mg by mouth daily.     b complex vitamins capsule Take 1 capsule by mouth daily.     cephALEXin  (KEFLEX ) 500 MG capsule Take 1 capsule (500 mg total) by mouth 2 (two) times daily. 60 capsule 11   cholecalciferol  (VITAMIN D3) 25 MCG (1000 UNIT) tablet Take 5,000 Units by mouth daily.     clotrimazole -betamethasone  (LOTRISONE ) cream Apply 1 Application topically 2 (two) times daily as needed (for irritation under the breasts or abdominal folds).     DULoxetine  (CYMBALTA ) 60 MG capsule Take 60 mg by mouth at bedtime.     famotidine  (PEPCID ) 20 MG tablet TAKE 1 TABLET BY MOUTH AFTER SUPPER (Patient taking differently: Take 20 mg by mouth at bedtime.) 30 tablet 0   Ferrous Fumarate -Folic Acid  324-1 MG TABS Take  1 tablet by mouth daily. Hematinic     fluconazole  (DIFLUCAN ) 200 MG tablet Take 200 mg by mouth daily.     gabapentin  (NEURONTIN ) 400 MG capsule Take 400 mg by mouth 3 (three) times daily.     meclizine  (ANTIVERT ) 25 MG tablet Take 25 mg by mouth every 8 (eight) hours as needed.     morphine  (MSIR) 15 MG tablet Take 15 mg by mouth 3 (three) times daily as needed.     naloxone  (NARCAN ) nasal spray 4 mg/0.1 mL Place 1 spray into the nose once as needed (accidental over dose).     nutrition supplement, JUVEN, (JUVEN) PACK Take 1 packet by mouth 2 (two) times daily between meals.     ondansetron  (ZOFRAN )  4 MG tablet Take 1 tablet (4 mg total) by mouth every 8 (eight) hours as needed for nausea or vomiting. 30 tablet 0   oxyCODONE  (ROXICODONE ) 5 MG immediate release tablet Take 1 tablet (5 mg total) by mouth every 4 (four) hours as needed for severe pain. 42 tablet 0   pantoprazole  (PROTONIX ) 40 MG tablet Take 40 mg by mouth daily with breakfast.     promethazine  (PHENERGAN ) 25 MG tablet Take 25 mg by mouth every 6 (six) hours as needed for nausea or vomiting.     tamsulosin  (FLOMAX ) 0.4 MG CAPS capsule Take 1 capsule (0.4 mg total) by mouth daily. 30 capsule 0   XARELTO  20 MG TABS tablet Take 20 mg by mouth at bedtime.     zinc  gluconate 50 MG tablet Take 50 mg by mouth daily.     No current facility-administered medications for this visit.    Review of Systems  Constitutional:  Constitutional negative. HENT: HENT negative.  Eyes: Eyes negative.  Respiratory: Positive for shortness of breath.  Cardiovascular: Positive for leg swelling.  GI: Gastrointestinal negative.  Musculoskeletal: Positive for gait problem and leg pain.  Hematologic: Hematologic/lymphatic negative.  Psychiatric: Psychiatric negative.        Objective:  Objective   Vitals:   12/22/23 1407  BP: 110/75  Pulse: 79  Temp: 97.7 F (36.5 C)  SpO2: 90%     Physical Exam HENT:     Head: Normocephalic.     Nose:  Nose normal.  Eyes:     Pupils: Pupils are equal, round, and reactive to light.  Cardiovascular:     Rate and Rhythm: Normal rate.  Pulmonary:     Effort: Pulmonary effort is normal.  Abdominal:     General: Abdomen is flat.  Musculoskeletal:        General: Normal range of motion.     Cervical back: Normal range of motion.     Right lower leg: No edema.     Left lower leg: No edema.  Skin:    General: Skin is warm.     Capillary Refill: Capillary refill takes less than 2 seconds.  Neurological:     General: No focal deficit present.     Mental Status: She is alert.  Psychiatric:        Mood and Affect: Mood normal.        Thought Content: Thought content normal.        Judgment: Judgment normal.     Data: Previous filter imaging reviewed     Assessment/Plan:    68 year old female with a history of IVC filter placement for left total knee arthroplasty which was then removed and replaced for revision.  Patient still having right knee issues has had injections but has not been strong enough for any surgical intervention.  She is also in a wheelchair today due to recent falls.  We have discussed that placing a replacing filter certainly is not without risk and he would be preferable to keep the filter until absolutely necessary to remove when she will not need any further procedures.  She demonstrates good understanding of this today in the presence of her husband and will call to have filter removed when all considerable surgeries have been completed.  We discussed the risk of keeping a filter particularly thrombosis which should be low risk given that she is anticoagulated and also risk of migration which is low risk and risk of invasion of surrounding structures which certainly  can happen after time and they demonstrate good understanding of this but realizing that removing this filter also comes with increasing risk with each additional filter.     Penne Lonni [place]  MD [facility]

## 2023-12-23 NOTE — Telephone Encounter (Signed)
Patient denies symptoms

## 2023-12-29 ENCOUNTER — Encounter: Payer: Medicare Other | Admitting: Urology

## 2024-01-27 ENCOUNTER — Telehealth: Payer: Self-pay

## 2024-01-27 ENCOUNTER — Ambulatory Visit (INDEPENDENT_AMBULATORY_CARE_PROVIDER_SITE_OTHER): Payer: Medicare Other | Admitting: Urology

## 2024-01-27 ENCOUNTER — Encounter: Payer: Self-pay | Admitting: Urology

## 2024-01-27 ENCOUNTER — Ambulatory Visit (HOSPITAL_COMMUNITY)
Admission: RE | Admit: 2024-01-27 | Discharge: 2024-01-27 | Disposition: A | Discharge status: Home / Self Care | Payer: Medicare Other | Source: Ambulatory Visit | Attending: Urology | Admitting: Urology

## 2024-01-27 ENCOUNTER — Other Ambulatory Visit: Payer: Self-pay | Admitting: Urology

## 2024-01-27 VITALS — BP 125/78 | HR 96 | Temp 98.7°F

## 2024-01-27 DIAGNOSIS — R112 Nausea with vomiting, unspecified: Secondary | ICD-10-CM | POA: Diagnosis present

## 2024-01-27 DIAGNOSIS — R82998 Other abnormal findings in urine: Secondary | ICD-10-CM | POA: Diagnosis not present

## 2024-01-27 DIAGNOSIS — N200 Calculus of kidney: Secondary | ICD-10-CM | POA: Diagnosis present

## 2024-01-27 DIAGNOSIS — R399 Unspecified symptoms and signs involving the genitourinary system: Secondary | ICD-10-CM

## 2024-01-27 DIAGNOSIS — R109 Unspecified abdominal pain: Secondary | ICD-10-CM | POA: Diagnosis present

## 2024-01-27 DIAGNOSIS — N201 Calculus of ureter: Secondary | ICD-10-CM

## 2024-01-27 DIAGNOSIS — Z87442 Personal history of urinary calculi: Secondary | ICD-10-CM

## 2024-01-27 MED ORDER — TAMSULOSIN HCL 0.4 MG PO CAPS: 0.4000 mg | ORAL_CAPSULE | Freq: Every day | ORAL | 1 refills | Status: DC

## 2024-01-27 NOTE — Telephone Encounter (Signed)
Patient is made aware per Maralyn Sago UA is normal- no blood, leukocytes, nitrites. Voiced understanding.

## 2024-01-27 NOTE — Progress Notes (Signed)
Name: Norma Anthony DOB: 1956/04/25 MRN: [id number]  History of Present Illness: Norma Anthony is a 68 y.o. female who presents today for follow up visit at [facility]. She is accompanied by her husband Norma Needle. - GU History: 1. Kidney stones. - Previously followed by [facility] at [facility]. - 09/09/2020: Underwent right ureteroscopic stone manipulation. Stone analysis showed calcium oxalate monohydrate composition.  Recent history: > She was seen in the ER on 11/05/2023 for right flank pain and gross hematuria with blood clots in urine. - CMP with normal renal function (GFR >60; creatinine 0.69). - CBC with no leukocytosis (WBC 7.5). - UA showed >50 RBC/hpf with no evidence of UTI. - CT showed a non-obstructing 3 mm distal right ureteral stone approximately 3 cm proximal to the UVJ. No hydronephrosis or hydroureter.  - She was discharged with prescriptions for Flomax. "Patient reports that she has oxycodone at home and does not need additional pain management."  At initial visit on 11/19/2023:  - Symptomatic. - KUB: "Previously described RIGHT distal ureteral stone is not discretely visualized radiographically and is obscured by overlapping bowel contents."  Since last visit: 11/30/2023: Seen in ER in [place] while on vacation for stone symptoms. > CT abdomen/pelvis w/o contrast in [place] showed: - Tiny non-obstructing left intrarenal stone.  - Small non-obstructing right kidney lower pole stone.  - Mild right hydroureteronephrosis.  - No distal ureteral stones.  - 5.5 mm stone in the central dependent portion of the bladder. > She feels confident that she passed bladder stone while in the ER that day.  Abdominal x-rays were negative for stones on 12/06/2023, 12/10/2023, and 12/14/2023.  Today: She reports that over the past few days she has had near-constant right flank pain radiating to abdominal RLQ which is described as moderate-to-severe in  intensity. Yesterday she restarted Flomax (Tamsulosin) 0.4 mg daily, which she had leftover from prior stone episode. She reports that in the past few days she has also had some nausea, one episode of vomiting, red-tinged urine, and increased urinary urgency / frequency. Denies fevers. Reports that she restarted using her at-home Purewick for voiding to manage the increased urinary urgency / frequency because she is fearful of falling on the way to the bathroom due to her ambulatory dysfunction + Xarelto use.  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 Continues to take Keflex daily for that.   Fall Screening: Do you usually have a device to assist in your mobility? Yes   Medications: Current Outpatient Medications  Medication Sig Dispense Refill   albuterol (VENTOLIN HFA) 108 (90 Base) MCG/ACT inhaler Inhale 2 puffs into the lungs every 6 (six) hours as needed for wheezing or shortness of breath.     ALPRAZolam (XANAX) 1 MG tablet Take 1 tablet (1 mg total) by mouth every 6 (six) hours as needed for anxiety or sleep. 30 tablet 0   ascorbic acid (VITAMIN C) 1000 MG tablet Take 1,000 mg by mouth daily.     b complex vitamins capsule Take 1 capsule by mouth daily.     cephALEXin (KEFLEX) 500 MG capsule Take 1 capsule (500 mg total) by mouth 2 (two) times daily. 60 capsule 11   cholecalciferol (VITAMIN D3) 25 MCG (1000 UNIT) tablet Take 5,000 Units by mouth daily.     clotrimazole-betamethasone (LOTRISONE) cream Apply 1 Application topically 2 (two) times daily as needed (for irritation under the breasts or abdominal folds).  DULoxetine (CYMBALTA) 60 MG capsule Take 60 mg by mouth at bedtime.     famotidine (PEPCID) 20 MG tablet TAKE 1 TABLET BY MOUTH AFTER SUPPER (Patient taking differently: Take 20 mg by mouth at bedtime.) 30 tablet 0   Ferrous Fumarate-Folic Acid 324-1 MG TABS Take 1 tablet by mouth daily. Hematinic      fluconazole (DIFLUCAN) 200 MG tablet Take 200 mg by mouth daily.     gabapentin (NEURONTIN) 400 MG capsule Take 400 mg by mouth 3 (three) times daily.     meclizine (ANTIVERT) 25 MG tablet Take 25 mg by mouth every 8 (eight) hours as needed.     morphine (MSIR) 15 MG tablet Take 15 mg by mouth 3 (three) times daily as needed.     naloxone (NARCAN) nasal spray 4 mg/0.1 mL Place 1 spray into the nose once as needed (accidental over dose).     nutrition supplement, JUVEN, (JUVEN) PACK Take 1 packet by mouth 2 (two) times daily between meals.     ondansetron (ZOFRAN) 4 MG tablet Take 1 tablet (4 mg total) by mouth every 8 (eight) hours as needed for nausea or vomiting. 30 tablet 0   pantoprazole (PROTONIX) 40 MG tablet Take 40 mg by mouth daily with breakfast.     promethazine (PHENERGAN) 25 MG tablet Take 25 mg by mouth every 6 (six) hours as needed for nausea or vomiting.     XARELTO 20 MG TABS tablet Take 20 mg by mouth at bedtime.     zinc gluconate 50 MG tablet Take 50 mg by mouth daily.     tamsulosin (FLOMAX) 0.4 MG CAPS capsule Take 1 capsule (0.4 mg total) by mouth daily. 30 capsule 1   No current facility-administered medications for this visit.    Allergies: Allergies  Allergen Reactions   Pregabalin Other (See Comments)    Paralysis present (finding)- not tolerated  Pt stated she could not move her body but she could think   Belbuca [Buprenorphine Hcl] Other (See Comments)    " Horrible headache"    Compazine [Prochlorperazine] Anxiety   Droperidol Anxiety   Ergotamine-Caffeine Anxiety and Hypertension    Increased systolic arterial pressure (finding)     Past Medical History:  Diagnosis Date   Acute respiratory failure with hypoxia (HCC) 03/23/2022   Acute respiratory failure with hypoxia (HCC) 03/23/2022   Anemia    Anxiety    Arthritis    CAD (coronary artery disease)    pt deneies   COPD (chronic obstructive pulmonary disease) (HCC)    Depression    Dyspnea     Fibromyalgia    Generalized anxiety disorder 12/11/2022   GERD (gastroesophageal reflux disease)    Hiatal hernia    History of kidney stones    Lung nodule 03/24/2022   Migraine headache    Multinodular goiter    Nephrolithiasis    Obstructive sleep apnea syndrome 07/01/2021   Opioid dependence (HCC) 03/23/2022   Pneumonia    Pulmonary embolism (HCC) 1996   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   Sleep apnea    Thyroid nodule    Past Surgical History:  Procedure Laterality Date   ABDOMINAL HYSTERECTOMY     BIOPSY THYROID     EXCISIONAL TOTAL KNEE ARTHROPLASTY WITH ANTIBIOTIC SPACERS Left 03/10/2023   Procedure: Irrigation and debridement left knee, resection  total knee arthroplasty, antibiotic spacer placement;  Surgeon: Samson Frederic, MD;  Location: [facility];  Service: Orthopedics;  Laterality: Left;   HERNIA REPAIR     I & D EXTREMITY Left 12/11/2022   Procedure: IRRIGATION AND DEBRIDEMENT EXTREMITY;  Surgeon: Yolonda Kida, MD;  Location: [facility];  Service: Orthopedics;  Laterality: Left;   IVC FILTER INSERTION N/A 09/28/2022   Procedure: IVC FILTER INSERTION;  Surgeon: Maeola Harman, MD;  Location: [facility] INVASIVE CV LAB;  Service: Cardiovascular;  Laterality: N/A;   IVC FILTER INSERTION N/A 08/09/2023   Procedure: IVC FILTER INSERTION;  Surgeon: Maeola Harman, MD;  Location: [facility] INVASIVE CV LAB;  Service: Cardiovascular;  Laterality: N/A;   IVC FILTER REMOVAL N/A 01/18/2023   Procedure: IVC FILTER REMOVAL;  Surgeon: Maeola Harman, MD;  Location: [facility] INVASIVE CV LAB;  Service: Cardiovascular;  Laterality: N/A;   KIDNEY STONE SURGERY     KNEE ARTHROTOMY Left 02/03/2023   Procedure: KNEE ARTHROTOMY WITH SOFT TISSUE REPAIR;  Surgeon: Samson Frederic, MD;  Location: [facility];  Service: Orthopedics;   Laterality: Left;  75   LAPAROSCOPIC NISSEN FUNDOPLICATION     PAROTIDECTOMY     REMOVAL OF CEMENTED SPACER KNEE Left 08/25/2023   Procedure: Knee spacer removal;  Surgeon: Samson Frederic, MD;  Location: [facility];  Service: Orthopedics;  Laterality: Left;  350   TONSILLECTOMY     TOTAL KNEE ARTHROPLASTY Left 11/04/2022   Procedure: TOTAL KNEE ARTHROPLASTY;  Surgeon: Jene Every, MD;  Location: [facility];  Service: Orthopedics;  Laterality: Left;   TOTAL KNEE REVISION Left 08/25/2023   Procedure: TOTAL KNEE REVISION;  Surgeon: Samson Frederic, MD;  Location: [facility];  Service: Orthopedics;  Laterality: Left;  350   Family History  Problem Relation Age of Onset   Hypertension Mother    Social History   Socioeconomic History   Marital status: Married    Spouse name: Not on file   Number of children: Not on file   Years of education: Not on file   Highest education level: Not on file  Occupational History   Not on file  Tobacco Use   Smoking status: Former    Current packs/day: 0.00    Average packs/day: 0.5 packs/day for 10.0 years (5.0 ttl pk-yrs)    Types: Cigarettes    Start date: 02/13/2011    Quit date: 02/12/2021    Years since quitting: 2.9   Smokeless tobacco: Never  Vaping Use   Vaping status: Never Used  Substance and Sexual Activity   Alcohol use: Not Currently   Drug use: Not Currently   Sexual activity: Not Currently  Other Topics Concern   Not on file  Social History Narrative   Not on file   Social Drivers of Health   Financial Resource Strain: Low Risk  (04/23/2021)   Received from [facility], [facility]   Overall Financial Resource Strain (CARDIA)    Difficulty of Paying Living Expenses: Not hard at all  Food Insecurity: No Food Insecurity (08/25/2023)   Hunger Vital Sign    Worried About Running Out of Food in the Last Year: Never true    Ran Out of Food in the Last Year: Never true  Transportation Needs: No Transportation Needs (08/25/2023)   PRAPARE  - Administrator, Civil Service (Medical): No    Lack of Transportation (Non-Medical): No  Physical Activity: Not on file  Stress: Stress Concern Present (04/23/2021)   Received from [facility], [facility]   [employer] of Occupational Health - Occupational Stress Questionnaire    Feeling of Stress : To some extent  Social Connections: Unknown (04/16/2022)   Received from [facility], [facility]   Social Network    Social Network: Not on file  Intimate Partner Violence: Not At Risk (11/30/2023)   Received from [facility] Domestic Violence    Do you feel threatened or afraid of others close to you?: No    SUBJECTIVE  Review of Systems Constitutional: Patient denies any unintentional weight loss or change in strength lntegumentary: Patient denies any rashes or pruritus Cardiovascular: Patient denies chest pain or syncope Respiratory: Patient denies shortness of breath Gastrointestinal: As per HPI Musculoskeletal: Patient reports feeling weak  Hematologic/Lymphatic: Patient denies bleeding tendencies Endocrine: Patient denies heat/cold intolerance  GU: As per HPI.  OBJECTIVE Vitals:   01/27/24 1532 01/27/24 1631  BP: 136/83 125/78  Pulse: (!) 101 96  Temp:  98.7 F (37.1 C)   There is no height or weight on file to calculate BMI.  Physical Examination Constitutional: Patient appears somewhat pale Cardiovascular: No visible lower extremity edema.  Respiratory: The patient does not have audible wheezing/stridor; respirations do not appear labored  Gastrointestinal: Abdomen non-distended Musculoskeletal: Normal ROM of UEs  Skin: No obvious rashes/open sores  Neurologic: CN 2-12 grossly intact Psychiatric: Answered questions appropriately with normal affect  Hematologic/Lymphatic/Immunologic: No obvious bruises or sites of spontaneous bleeding  UA (I&O cath): calcium oxalate crystals, otherwise unremarkable  ASSESSMENT Right flank  pain - Plan: CT RENAL STONE STUDY, tamsulosin (FLOMAX) 0.4 MG CAPS capsule, In and Out Cath, CANCELED: Urine culture  Kidney stones - Plan: Urinalysis, Routine w reflex microscopic, CT RENAL STONE STUDY, tamsulosin (FLOMAX) 0.4 MG CAPS capsule, In and Out Cath, CANCELED: Urine culture  Nausea and vomiting, unspecified vomiting type - Plan: CT RENAL STONE STUDY, tamsulosin (FLOMAX) 0.4 MG CAPS capsule, In and Out Cath, CANCELED: Urine culture  Lower urinary tract symptoms (LUTS) - Plan: CT RENAL STONE STUDY, tamsulosin (FLOMAX) 0.4 MG CAPS capsule, In and Out Cath, CANCELED: Urine culture  Right ureteral stone - Plan: tamsulosin (FLOMAX) 0.4 MG CAPS capsule, In and Out Cath, CANCELED: Urine culture  Calcium oxalate crystals in urine  We reviewed history and imaging results since last visit. We discussed suspected right ureteral stone today; advised CT stone for further evaluation since her stones have not been well evaluated with x-rays. CT was ordered as STAT based on her acute symptoms / presentation.   She was advised to continue Flomax (Tamsulosin) 0.4 mg daily for medical expulsive therapy at this time; refill sent.  May continue opioid analgesic as directed by her pain management specialist (MSIR).  She was advised to go to the ER if She develops fever >100.5 F, uncontrollable pain, or other significantly concerning symptoms.  Will determine follow up based on results. Patient verbalized understanding of and agreement with current plan. All questions were answered.  PLAN Advised the following: CT stone (stat). Continue Flomax (Tamsulosin) 0.4 mg daily. Return for follow up to be determined based on results.  Orders Placed This Encounter  Procedures   CT RENAL STONE STUDY    Standing Status:   Future    Expected Date:   01/27/2024    Expiration Date:   01/26/2025    Preferred imaging location?:   [facility]    Call Results- Best Contact Number?:   [phone] do not hold  Urinalysis, Routine w reflex microscopic   In and Out Cath   Total time spent caring for the patient today was over 30 minutes. This includes time spent on the date of the visit reviewing the patient's chart before the visit, time spent during the visit, and time spent after the visit on documentation. Over 50% of that time was spent in face-to-face time with this patient for direct counseling. E&M based on time and complexity of medical decision making.  It has been explained that the patient is to follow regularly with their PCP in addition to all other providers involved in their care and to follow instructions provided by these respective offices. Patient advised to contact urology clinic if any urologic-pertaining questions, concerns, new symptoms or problems arise in the interim period.  There are no Patient Instructions on file for this visit.  Electronically signed by:  Donnita Falls, MSN, FNP-C, CUNP 01/27/2024 4:36 PM

## 2024-01-28 ENCOUNTER — Telehealth: Payer: Self-pay

## 2024-01-28 LAB — URINALYSIS, ROUTINE W REFLEX MICROSCOPIC
Bilirubin, UA: NEGATIVE
Glucose, UA: NEGATIVE
Leukocytes,UA: NEGATIVE
Nitrite, UA: NEGATIVE
Protein,UA: NEGATIVE
RBC, UA: NEGATIVE
Specific Gravity, UA: 1.03 (ref 1.005–1.030)
Urobilinogen, Ur: 0.2 mg/dL (ref 0.2–1.0)
pH, UA: 6 (ref 5.0–7.5)

## 2024-01-28 NOTE — Progress Notes (Signed)
Please let pt know CT showed no acute findings. No ureteral stones or hydronephrosis; has a stable 3 mm non-obstructing stone in right kidney. UA yesterday was normal aside from some calcium oxalate crystals. Suspect she may have recently passed a stone; if so, symptoms are likely to improve over the next few days. If her pain, nausea, etc persists then she is advised to consult her PCP (or go to ER if severe).

## 2024-01-28 NOTE — Telephone Encounter (Signed)
-----   Message from Donnita Falls sent at 01/28/2024  8:47 AM EST ----- Please let pt know CT showed no acute findings. No ureteral stones or hydronephrosis; has a stable 3 mm non-obstructing stone in right kidney. UA yesterday was normal aside from some calcium oxalate crystals. Suspect she may have recently passed a stone; if so, symptoms are likely to improve over the next few days. If her pain, nausea, etc persists then she is advised to consult her PCP (or go to ER if severe).

## 2024-01-28 NOTE — Telephone Encounter (Signed)
Patient was made aware and voiced understanding.

## 2024-03-29 ENCOUNTER — Encounter (HOSPITAL_COMMUNITY): Payer: Self-pay

## 2024-03-29 ENCOUNTER — Emergency Department (HOSPITAL_COMMUNITY)

## 2024-03-29 ENCOUNTER — Inpatient Hospital Stay (HOSPITAL_COMMUNITY)
Admission: EM | Admit: 2024-03-29 | Discharge: 2024-04-02 | DRG: 392 | Disposition: A | Discharge status: Home / Self Care | Attending: Internal Medicine | Admitting: Internal Medicine

## 2024-03-29 ENCOUNTER — Other Ambulatory Visit: Payer: Self-pay

## 2024-03-29 DIAGNOSIS — Z79899 Other long term (current) drug therapy: Secondary | ICD-10-CM

## 2024-03-29 DIAGNOSIS — Z96652 Presence of left artificial knee joint: Secondary | ICD-10-CM | POA: Diagnosis present

## 2024-03-29 DIAGNOSIS — J9611 Chronic respiratory failure with hypoxia: Secondary | ICD-10-CM | POA: Diagnosis present

## 2024-03-29 DIAGNOSIS — Z6839 Body mass index (BMI) 39.0-39.9, adult: Secondary | ICD-10-CM

## 2024-03-29 DIAGNOSIS — R109 Unspecified abdominal pain: Secondary | ICD-10-CM | POA: Insufficient documentation

## 2024-03-29 DIAGNOSIS — K449 Diaphragmatic hernia without obstruction or gangrene: Secondary | ICD-10-CM | POA: Diagnosis present

## 2024-03-29 DIAGNOSIS — F411 Generalized anxiety disorder: Secondary | ICD-10-CM | POA: Diagnosis present

## 2024-03-29 DIAGNOSIS — Z86718 Personal history of other venous thrombosis and embolism: Secondary | ICD-10-CM | POA: Diagnosis not present

## 2024-03-29 DIAGNOSIS — Z86711 Personal history of pulmonary embolism: Secondary | ICD-10-CM | POA: Diagnosis not present

## 2024-03-29 DIAGNOSIS — F32A Depression, unspecified: Secondary | ICD-10-CM | POA: Diagnosis present

## 2024-03-29 DIAGNOSIS — Z7901 Long term (current) use of anticoagulants: Secondary | ICD-10-CM

## 2024-03-29 DIAGNOSIS — D6832 Hemorrhagic disorder due to extrinsic circulating anticoagulants: Secondary | ICD-10-CM | POA: Diagnosis present

## 2024-03-29 DIAGNOSIS — E876 Hypokalemia: Secondary | ICD-10-CM | POA: Diagnosis present

## 2024-03-29 DIAGNOSIS — Z95828 Presence of other vascular implants and grafts: Secondary | ICD-10-CM

## 2024-03-29 DIAGNOSIS — R197 Diarrhea, unspecified: Secondary | ICD-10-CM | POA: Diagnosis not present

## 2024-03-29 DIAGNOSIS — R1110 Vomiting, unspecified: Secondary | ICD-10-CM | POA: Insufficient documentation

## 2024-03-29 DIAGNOSIS — K219 Gastro-esophageal reflux disease without esophagitis: Secondary | ICD-10-CM | POA: Diagnosis present

## 2024-03-29 DIAGNOSIS — E66812 Obesity, class 2: Secondary | ICD-10-CM | POA: Diagnosis present

## 2024-03-29 DIAGNOSIS — Z87891 Personal history of nicotine dependence: Secondary | ICD-10-CM

## 2024-03-29 DIAGNOSIS — K5732 Diverticulitis of large intestine without perforation or abscess without bleeding: Principal | ICD-10-CM | POA: Diagnosis present

## 2024-03-29 DIAGNOSIS — E860 Dehydration: Secondary | ICD-10-CM | POA: Diagnosis present

## 2024-03-29 DIAGNOSIS — R296 Repeated falls: Secondary | ICD-10-CM | POA: Diagnosis present

## 2024-03-29 DIAGNOSIS — I2510 Atherosclerotic heart disease of native coronary artery without angina pectoris: Secondary | ICD-10-CM | POA: Diagnosis present

## 2024-03-29 DIAGNOSIS — T45515A Adverse effect of anticoagulants, initial encounter: Secondary | ICD-10-CM | POA: Diagnosis present

## 2024-03-29 DIAGNOSIS — R9431 Abnormal electrocardiogram [ECG] [EKG]: Secondary | ICD-10-CM | POA: Diagnosis present

## 2024-03-29 DIAGNOSIS — M797 Fibromyalgia: Secondary | ICD-10-CM | POA: Diagnosis present

## 2024-03-29 DIAGNOSIS — Z888 Allergy status to other drugs, medicaments and biological substances status: Secondary | ICD-10-CM

## 2024-03-29 DIAGNOSIS — F419 Anxiety disorder, unspecified: Secondary | ICD-10-CM | POA: Diagnosis present

## 2024-03-29 DIAGNOSIS — K5792 Diverticulitis of intestine, part unspecified, without perforation or abscess without bleeding: Principal | ICD-10-CM | POA: Diagnosis present

## 2024-03-29 DIAGNOSIS — Z8249 Family history of ischemic heart disease and other diseases of the circulatory system: Secondary | ICD-10-CM

## 2024-03-29 DIAGNOSIS — Z9981 Dependence on supplemental oxygen: Secondary | ICD-10-CM | POA: Diagnosis not present

## 2024-03-29 DIAGNOSIS — G4733 Obstructive sleep apnea (adult) (pediatric): Secondary | ICD-10-CM | POA: Diagnosis present

## 2024-03-29 DIAGNOSIS — M7981 Nontraumatic hematoma of soft tissue: Secondary | ICD-10-CM | POA: Diagnosis present

## 2024-03-29 DIAGNOSIS — J449 Chronic obstructive pulmonary disease, unspecified: Secondary | ICD-10-CM | POA: Diagnosis present

## 2024-03-29 DIAGNOSIS — Z993 Dependence on wheelchair: Secondary | ICD-10-CM

## 2024-03-29 LAB — COMPREHENSIVE METABOLIC PANEL WITH GFR
ALT: 20 U/L (ref 0–44)
AST: 25 U/L (ref 15–41)
Albumin: 3.8 g/dL (ref 3.5–5.0)
Alkaline Phosphatase: 99 U/L (ref 38–126)
Anion gap: 17 — ABNORMAL HIGH (ref 5–15)
BUN: 11 mg/dL (ref 8–23)
CO2: 30 mmol/L (ref 22–32)
Calcium: 9.7 mg/dL (ref 8.9–10.3)
Chloride: 93 mmol/L — ABNORMAL LOW (ref 98–111)
Creatinine, Ser: 0.57 mg/dL (ref 0.44–1.00)
GFR, Estimated: >60 mL/min (ref >60)
Glucose, Bld: 119 mg/dL — ABNORMAL HIGH (ref 70–99)
Potassium: 3.2 mmol/L — ABNORMAL LOW (ref 3.5–5.1)
Sodium: 140 mmol/L (ref 135–145)
Total Bilirubin: 0.8 mg/dL (ref 0.0–1.2)
Total Protein: 8.1 g/dL (ref 6.5–8.1)

## 2024-03-29 LAB — LIPASE, BLOOD: Lipase: 21 U/L (ref 11–51)

## 2024-03-29 LAB — URINALYSIS, W/ REFLEX TO CULTURE (INFECTION SUSPECTED)
Bacteria, UA: NONE SEEN
Bilirubin Urine: NEGATIVE
Glucose, UA: NEGATIVE mg/dL
Hgb urine dipstick: NEGATIVE
Ketones, ur: 5 mg/dL — AB
Leukocytes,Ua: NEGATIVE
Nitrite: NEGATIVE
Protein, ur: NEGATIVE mg/dL
Specific Gravity, Urine: >1.046 — ABNORMAL HIGH (ref 1.005–1.030)
pH: 8 (ref 5.0–8.0)

## 2024-03-29 LAB — CBC
HCT: 51 % — ABNORMAL HIGH (ref 36.0–46.0)
Hemoglobin: 16.3 g/dL — ABNORMAL HIGH (ref 12.0–15.0)
MCH: 30.2 pg (ref 26.0–34.0)
MCHC: 32 g/dL (ref 30.0–36.0)
MCV: 94.4 fL (ref 80.0–100.0)
Platelets: 187 10*3/uL (ref 150–400)
RBC: 5.4 MIL/uL — ABNORMAL HIGH (ref 3.87–5.11)
RDW: 14.2 % (ref 11.5–15.5)
WBC: 12.5 10*3/uL — ABNORMAL HIGH (ref 4.0–10.5)
nRBC: 0 % (ref 0.0–0.2)

## 2024-03-29 LAB — TROPONIN I (HIGH SENSITIVITY)
Troponin I (High Sensitivity): 13 ng/L (ref <18)
Troponin I (High Sensitivity): 12 ng/L (ref <18)

## 2024-03-29 MED ORDER — HYDROMORPHONE HCL 1 MG/ML IJ SOLN
0.5000 mg | Freq: Once | INTRAMUSCULAR | Status: AC
  Administered 2024-03-29: 0.5 mg via INTRAVENOUS
  Filled 2024-03-29: qty 0.5

## 2024-03-29 MED ORDER — ONDANSETRON HCL 4 MG/2ML IJ SOLN
4.0000 mg | Freq: Once | INTRAMUSCULAR | Status: AC
  Administered 2024-03-29: 4 mg via INTRAVENOUS
  Filled 2024-03-29: qty 2

## 2024-03-29 MED ORDER — POTASSIUM CHLORIDE CRYS ER 20 MEQ PO TBCR
40.0000 meq | EXTENDED_RELEASE_TABLET | Freq: Once | ORAL | Status: AC
  Administered 2024-03-29: 40 meq via ORAL
  Filled 2024-03-29: qty 2

## 2024-03-29 MED ORDER — METRONIDAZOLE 500 MG/100ML IV SOLN
500.0000 mg | Freq: Once | INTRAVENOUS | Status: AC
  Administered 2024-03-30: 500 mg via INTRAVENOUS
  Filled 2024-03-29: qty 100

## 2024-03-29 MED ORDER — HYDRALAZINE HCL 20 MG/ML IJ SOLN
10.0000 mg | Freq: Four times a day (QID) | INTRAMUSCULAR | Status: DC | PRN
Start: 2024-03-29 — End: 2024-04-02

## 2024-03-29 MED ORDER — METOCLOPRAMIDE HCL 5 MG/ML IJ SOLN
5.0000 mg | Freq: Once | INTRAMUSCULAR | Status: AC
  Administered 2024-03-29: 5 mg via INTRAVENOUS
  Filled 2024-03-29: qty 2

## 2024-03-29 MED ORDER — SODIUM CHLORIDE 0.9 % IV BOLUS
1000.0000 mL | Freq: Once | INTRAVENOUS | Status: AC
  Administered 2024-03-29: 1000 mL via INTRAVENOUS

## 2024-03-29 MED ORDER — SODIUM CHLORIDE 0.9 % IV SOLN
2.0000 g | Freq: Once | INTRAVENOUS | Status: AC
  Administered 2024-03-29: 2 g via INTRAVENOUS
  Filled 2024-03-29: qty 20

## 2024-03-29 MED ORDER — LACTATED RINGERS IV BOLUS
1000.0000 mL | Freq: Once | INTRAVENOUS | Status: AC
  Administered 2024-03-29: 1000 mL via INTRAVENOUS

## 2024-03-29 MED ORDER — IOHEXOL 300 MG/ML  SOLN
100.0000 mL | Freq: Once | INTRAMUSCULAR | Status: AC | PRN
  Administered 2024-03-29: 100 mL via INTRAVENOUS

## 2024-03-29 NOTE — ED Provider Notes (Signed)
 [facility] AT [facility] Provider Note   CSN: [id number] Arrival date & time: 03/29/24  1308     History  Chief Complaint  Patient presents with   Nausea   Emesis   Headache   Diarrhea    Norma Anthony is a 68 y.o. female.  HPI 68 year old female presents with vomiting and diarrhea.  She had Urrea a couple weeks ago along with vomiting for a couple days that went away.  However the symptoms came back 3 days ago and have been persistent.  No bloody emesis or stool.  She has been having some abdominal pain as well.  She is also having some chest pain but she feels like that is primarily due to vomiting.  No dyspnea, cough, fever.  She does feel like she is having a little bit of dysuria today.  She feels overall weak and dehydrated.  Home Medications Prior to Admission medications   Medication Sig Start Date End Date Taking? Authorizing Provider  albuterol (VENTOLIN HFA) 108 (90 Base) MCG/ACT inhaler Inhale 2 puffs into the lungs every 6 (six) hours as needed for wheezing or shortness of breath. 05/12/21  Yes [provider]  ALPRAZolam Prudy Feeler) 1 MG tablet Take 1 tablet (1 mg total) by mouth every 6 (six) hours as needed for anxiety or sleep. 03/16/23  Yes Cassandria Anger, PA-C  DULoxetine (CYMBALTA) 60 MG capsule Take 60 mg by mouth at bedtime. 09/14/18  Yes [provider]  gabapentin (NEURONTIN) 400 MG capsule Take 400 mg by mouth 2 (two) times daily. 08/21/22  Yes [provider]  morphine (MSIR) 15 MG tablet Take 15 mg by mouth 3 (three) times daily as needed for moderate pain (pain score 4-6). 12/06/23  Yes [provider]  naloxone (NARCAN) nasal spray 4 mg/0.1 mL Place 1 spray into the nose once as needed (accidental over dose). 10/16/21  Yes [provider]  XARELTO 20 MG TABS tablet Take 20 mg by mouth at bedtime. 08/21/21  Yes [provider]  ascorbic acid (VITAMIN C) 1000 MG tablet Take 1,000 mg by  mouth daily.    [provider]  b complex vitamins capsule Take 1 capsule by mouth daily.    [provider]  cephALEXin (KEFLEX) 500 MG capsule Take 1 capsule (500 mg total) by mouth 2 (two) times daily. 08/31/23 08/25/24  Clois Dupes, PA-C  cholecalciferol (VITAMIN D3) 25 MCG (1000 UNIT) tablet Take 5,000 Units by mouth daily.    [provider]  clotrimazole-betamethasone (LOTRISONE) cream Apply 1 Application topically 2 (two) times daily as needed (for irritation under the breasts or abdominal folds).    [provider]  famotidine (PEPCID) 20 MG tablet TAKE 1 TABLET BY MOUTH AFTER SUPPER Patient taking differently: Take 20 mg by mouth at bedtime. 04/01/23   [name], MD  Ferrous Fumarate-Folic Acid 324-1 MG TABS Take 1 tablet by mouth daily. Hematinic 02/29/20   [provider]  fluconazole (DIFLUCAN) 200 MG tablet Take 200 mg by mouth daily. 06/25/23   [provider]  meclizine (ANTIVERT) 25 MG tablet Take 25 mg by mouth every 8 (eight) hours as needed for dizziness. 09/22/23   [provider]  nutrition supplement, JUVEN, (JUVEN) PACK Take 1 packet by mouth 2 (two) times daily between meals.    [provider]  ondansetron (ZOFRAN) 4 MG tablet Take 1 tablet (4 mg total) by mouth every 8 (eight) hours as needed for nausea  or vomiting. 08/31/23 08/30/24  Clois Dupes, PA-C  pantoprazole (PROTONIX) 40 MG tablet Take 40 mg by mouth daily with breakfast. 06/30/16   [provider]  promethazine (PHENERGAN) 25 MG tablet Take 25 mg by mouth every 6 (six) hours as needed for nausea or vomiting.    [provider]  tamsulosin (FLOMAX) 0.4 MG CAPS capsule TAKE 1 CAPSULE(0.4 MG) BY MOUTH DAILY 01/28/24   Donnita Falls, FNP  tiZANidine (ZANAFLEX) 2 MG tablet Take 2 mg by mouth every 12 (twelve) hours as needed for muscle spasms. 02/29/24   [provider]  zinc gluconate 50 MG tablet Take 50 mg by mouth  daily.    [provider]      Allergies    Belbuca [buprenorphine hcl], Pregabalin, Compazine [prochlorperazine], Droperidol, and Ergotamine-caffeine    Review of Systems   Review of Systems  Constitutional:  Negative for fever.  Respiratory:  Negative for cough and shortness of breath.   Cardiovascular:  Positive for chest pain.  Gastrointestinal:  Positive for abdominal pain, diarrhea, nausea and vomiting. Negative for blood in stool.  Genitourinary:  Negative for dysuria.    Physical Exam Updated Vital Signs BP (!) 175/88 (BP Location: Left Arm)   Pulse 60   Temp 98.9 F (37.2 C) (Oral)   Resp 18   Ht 5\' 5"  (1.651 m)   Wt 117.9 kg   SpO2 90%   BMI 43.27 kg/m  Physical Exam Vitals and nursing note reviewed.  Constitutional:      General: She is not in acute distress.    Appearance: She is well-developed. She is obese. She is not ill-appearing or diaphoretic.  HENT:     Head: Normocephalic and atraumatic.  Cardiovascular:     Rate and Rhythm: Normal rate and regular rhythm.     Heart sounds: Normal heart sounds.  Pulmonary:     Effort: Pulmonary effort is normal.     Breath sounds: Normal breath sounds.  Abdominal:     Palpations: Abdomen is soft.     Tenderness: There is abdominal tenderness in the epigastric area, left upper quadrant and left lower quadrant.  Skin:    General: Skin is warm and dry.  Neurological:     Mental Status: She is alert.     ED Results / Procedures / Treatments   Labs (all labs ordered are listed, but only abnormal results are displayed) Labs Reviewed  COMPREHENSIVE METABOLIC PANEL WITH GFR - Abnormal; Notable for the following components:      Result Value   Potassium 3.2 (*)    Chloride 93 (*)    Glucose, Bld 119 (*)    Anion gap 17 (*)    All other components within normal limits  CBC - Abnormal; Notable for the following components:   WBC 12.5 (*)    RBC 5.40 (*)    Hemoglobin 16.3 (*)    HCT 51.0 (*)    All  other components within normal limits  URINALYSIS, W/ REFLEX TO CULTURE (INFECTION SUSPECTED) - Abnormal; Notable for the following components:   Specific Gravity, Urine >1.046 (*)    Ketones, ur 5 (*)    All other components within normal limits  LIPASE, BLOOD  TROPONIN I (HIGH SENSITIVITY)  TROPONIN I (HIGH SENSITIVITY)    EKG EKG Interpretation Date/Time:  Wednesday March 29 2024 19:31:06 EDT Ventricular Rate:  67 PR Interval:  141 QRS Duration:  100 QT Interval:  544 QTC Calculation: 575 R Axis:   -  31  Text Interpretation: Sinus arrhythmia Abnormal R-wave progression, early transition Left ventricular hypertrophy Nonspecific T abnormalities, inferior leads  overall similar to Nov 2024 Confirmed by Pricilla Loveless [phone]) on 03/29/2024 7:41:15 PM  Radiology CT ABDOMEN PELVIS W CONTRAST Result Date: 03/29/2024 CLINICAL DATA:  Abdominal pain, acute, nonlocalized, with nausea, vomiting and diarrhea for 3 days. EXAM: CT ABDOMEN AND PELVIS WITH CONTRAST TECHNIQUE: Multidetector CT imaging of the abdomen and pelvis was performed using the standard protocol following bolus administration of intravenous contrast. RADIATION DOSE REDUCTION: This exam was performed according to the departmental dose-optimization program which includes automated exposure control, adjustment of the mA and/or kV according to patient size and/or use of iterative reconstruction technique. CONTRAST:  OMNIPAQUE IOHEXOL 300 MG/ML  SOLN COMPARISON:  CTs without contrast 01/27/2024 and 11/16/2023. FINDINGS: Lower chest: There are old postsurgical changes at the esophageal hiatus with a moderate-sized to large esophageal hiatal hernia with about half of the stomach intrathoracic. Lung bases remain clear.  The cardiac size is normal. Hepatobiliary: No focal liver abnormality is seen. The liver is mildly steatotic diffusely. No calcified gallstones, gallbladder wall thickening, or biliary dilatation. Pancreas: There is fatty  infiltration partial glandular atrophy. No mass enhancement. No inflammatory change. No ductal dilatation. Spleen: Normal. Adrenals/Urinary Tract: There is no adrenal mass. There are occasional bilateral subcentimeter cortical too small to characterize Bosniak 2 renal cysts. No follow-up imaging is recommended. No renal mass enhancement is seen. A 2 mm nonobstructing caliceal stone again is noted in the inferior pole of the right kidney. No nephrolithiasis is seen on the left. No ureteral stones or hydronephrosis bilaterally. The kidneys excrete symmetrically on the delayed images. The bladder is unremarkable for the degree of distention. Stomach/Bowel: There is a partially visible hiatal hernia, inverted partially intrathoracic stomach. There are chronic thickened folds in herniated stomach. There is jejunal fold thickening consistent with nonspecific enteritis or enteropathy. No bowel dilatation is seen.  The appendix is normal. The wall of the colon is normal thickness. There is scattered diverticulosis. A mixture of hazy and linear stranding extends anterior and inferior to the anterior wall of the mid transverse colon and could be due to a focal diverticulitis or other unrelated inflammatory process, or could be due to trauma with an evolving omental infarct. Also, the linear densities associated with the stranding could be due to attempted fistula formation but do not connect with any of the underlying bowel. This was not seen on the 2 prior studies, including most recently 2 months ago. There is no free air in the area or elsewhere, and no localizing fluid collection. This is best seen on series 2 axial images 34-44. No other focal reactive or inflammatory process is seen. Vascular/Lymphatic: Stable infrarenal IVC filter. Minimal aortic to left iliac atherosclerosis. No AAA. No adenopathy. Reproductive: Status post hysterectomy. No adnexal masses. Other: Upper abdominal ventral hernia repair again noted  without evidence of hernia recurrence. No free fluid, free hemorrhage or free air. Musculoskeletal: Osteopenia, multilevel degenerative discs and spondylosis lumbar spine with mild chronic wedging of the T12 and L1 vertebral bodies. No acute or other significant osseous findings. IMPRESSION: 1. A mixture of hazy and linear stranding extends anterior and inferior to the anterior wall of the mid transverse colon, and could be due to a focal diverticulitis or other unrelated inflammatory process, or could be due to trauma with an evolving omental infarct. 2. Also, The linear densities associated with the stranding could be due to attempted fistula formation  but do not connect with any of the underlying bowel. This was not seen on the 2 prior studies, including most recently 2 months ago. There is no free air or localizing fluid collection. 3. Jejunal fold thickening consistent with nonspecific enteritis or enteropathy. 4. Moderate-sized to large esophageal hiatal hernia with about half of the stomach intrathoracic. Chronic thickened folds in the herniated stomach. 5. Mild hepatic steatosis. 6. 2 mm nonobstructing right renal calyceal stone. 7. Aortic atherosclerosis. 8. Osteopenia and degenerative change. 9. Stable infrarenal IVC filter. Electronically Signed   By: Almira Bar M.D.   On: 03/29/2024 21:32   DG Chest Portable 1 View Result Date: 03/29/2024 CLINICAL DATA:  chest pain EXAM: PORTABLE CHEST 1 VIEW COMPARISON:  Chest x-ray 11/16/2023 FINDINGS: The heart and mediastinal contours are unchanged. Patient is rotated. No focal consolidation. No pulmonary edema. No pleural effusion. No pneumothorax. No acute osseous abnormality. IMPRESSION: No active disease. Electronically Signed   By: Tish Frederickson M.D.   On: 03/29/2024 20:14    Procedures Procedures    Medications Ordered in ED Medications  cefTRIAXone (ROCEPHIN) 2 g in sodium chloride 0.9 % 100 mL IVPB (2 g Intravenous New Bag/Given 03/29/24  2241)    And  metroNIDAZOLE (FLAGYL) IVPB 500 mg (has no administration in time range)  HYDROmorphone (DILAUDID) injection 0.5 mg (0.5 mg Intravenous Given 03/29/24 1845)  ondansetron (ZOFRAN) injection 4 mg (4 mg Intravenous Given 03/29/24 1845)  lactated ringers bolus 1,000 mL (0 mLs Intravenous Stopped 03/29/24 2213)  iohexol (OMNIPAQUE) 300 MG/ML solution 100 mL (100 mLs Intravenous Contrast Given 03/29/24 1853)  HYDROmorphone (DILAUDID) injection 0.5 mg (0.5 mg Intravenous Given 03/29/24 2032)  metoCLOPramide (REGLAN) injection 5 mg (5 mg Intravenous Given 03/29/24 2032)  potassium chloride SA (KLOR-CON M) CR tablet 40 mEq (40 mEq Oral Given 03/29/24 2236)  HYDROmorphone (DILAUDID) injection 0.5 mg (0.5 mg Intravenous Given 03/29/24 2236)  sodium chloride 0.9 % bolus 1,000 mL (1,000 mLs Intravenous New Bag/Given 03/29/24 2241)    ED Course/ Medical Decision Making/ A&P                                 Medical Decision Making Amount and/or Complexity of Data Reviewed Labs: ordered.    Details: Mild hypokalemia.  Leukocytosis. Radiology: ordered and independent interpretation performed.    Details: Abdominal inflammation, unclear source. ECG/medicine tests: ordered and independent interpretation performed.    Details: Nonspecific T waves  Risk Prescription drug management. Decision regarding hospitalization.   Patient's abdominal pain is probably from this inflammation, could be diverticulitis versus other intra-abdominal infection.  Will cover with Rocephin and Flagyl.  She was given fluids and I suspect she is hemoconcentrated from dehydration.  Despite multiple doses of pain and nausea medicine she still having pain and nausea and while she does not have a surgical abdomen I think she will need admission for pain control and antibiotics. Dr. Thomes Dinning will admit.        Final Clinical Impression(s) / ED Diagnoses Final diagnoses:  Diverticulitis    Rx / DC Orders ED Discharge  Orders     None         Pricilla Loveless, MD 03/29/24 2253

## 2024-03-29 NOTE — H&P (Signed)
 History and Physical    Patient: Norma Anthony GNF:[id number] DOB: March 14, 1956 DOA: 03/29/2024 DOS: the patient was seen and examined on 03/30/2024 PCP: Clinic, [facility]  Patient coming from: Home  Chief Complaint:  Chief Complaint  Patient presents with   Nausea   Emesis   Headache   Diarrhea   HPI: Norma Anthony is a 68 y.o. 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  Several weeks ago, patient had diarrhea and vomiting for a few days and self resolved.  About 3 days ago she had a recurrence of same symptoms which has been persistent.  This was associated with abdominal pain.  Reported chest pain was associated with recurrent NBNB vomiting.  She denied fever, shortness of breath or cough.  ED Course:  In the emergency department, BP was 149/90, other vital signs were within normal range.  Workup in the ED showed WBC 12.5, hemoglobin 16.3, hematocrit 51.0, MCV 94.4, platelets 187.Aaron Aas  BMP was normal except for potassium of 3.2, chloride 93, blood glucose 119, anion gap 17.  Troponin x 2 was normal, lipase 21, urinalysis was normal. CT abdomen pelvis with contrast was suggestive of focal diverticulitis or other unrelated inflammatory process.  Inguinal fold thickening consistent with nonspecific enteritis or enteropathy.  Moderate sized to large esophageal hiatal hernia with about half of the stomach intrathoracic. Chest x-ray showed no active disease. Pain medication, Reglan and Zofran were given.  IV ceftriaxone and metronidazole was given due to diverticulitis.  Potassium was replenished.Aaron Aas  Hospitalist was asked to admit patient for further evaluation and management.  Review of  Systems: Review of systems as noted in the HPI. All other systems reviewed and are negative.   Past Medical History:  Diagnosis Date   Acute respiratory failure with hypoxia (HCC) 03/23/2022   Acute respiratory failure with hypoxia (HCC) 03/23/2022   Anemia    Anxiety    Arthritis    CAD (coronary artery disease)    pt deneies   COPD (chronic obstructive pulmonary disease) (HCC)    Depression    Dyspnea    Fibromyalgia    Generalized anxiety disorder 12/11/2022   GERD (gastroesophageal reflux disease)    Hiatal hernia    History of kidney stones    Lung nodule 03/24/2022   Migraine headache    Multinodular goiter    Nephrolithiasis    Obstructive sleep apnea syndrome 07/01/2021   Opioid dependence (HCC) 03/23/2022   Pneumonia    Pulmonary embolism (HCC) 1996   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   Sleep apnea    Thyroid nodule    Past Surgical History:  Procedure Laterality Date   ABDOMINAL HYSTERECTOMY     BIOPSY THYROID     EXCISIONAL TOTAL KNEE ARTHROPLASTY WITH ANTIBIOTIC SPACERS Left 03/10/2023   Procedure: Irrigation and debridement left knee, resection total knee arthroplasty, antibiotic spacer placement;  Surgeon: Adonica Hoose, MD;  Location: [facility];  Service: Orthopedics;  Laterality: Left;   HERNIA REPAIR     I & D EXTREMITY Left 12/11/2022   Procedure: IRRIGATION AND DEBRIDEMENT EXTREMITY;  Surgeon: Hiram Lukes,  Arch Ko, MD;  Location: [facility];  Service: Orthopedics;  Laterality: Left;   IVC FILTER INSERTION N/A 09/28/2022   Procedure: IVC FILTER INSERTION;  Surgeon: Adine Hoof, MD;  Location: [facility] INVASIVE CV LAB;  Service: Cardiovascular;  Laterality: N/A;   IVC FILTER INSERTION N/A 08/09/2023   Procedure: IVC FILTER INSERTION;  Surgeon: Adine Hoof, MD;  Location: [facility] INVASIVE CV LAB;   Service: Cardiovascular;  Laterality: N/A;   IVC FILTER REMOVAL N/A 01/18/2023   Procedure: IVC FILTER REMOVAL;  Surgeon: Adine Hoof, MD;  Location: [facility] INVASIVE CV LAB;  Service: Cardiovascular;  Laterality: N/A;   KIDNEY STONE SURGERY     KNEE ARTHROTOMY Left 02/03/2023   Procedure: KNEE ARTHROTOMY WITH SOFT TISSUE REPAIR;  Surgeon: Adonica Hoose, MD;  Location: [facility];  Service: Orthopedics;  Laterality: Left;  75   LAPAROSCOPIC NISSEN FUNDOPLICATION     PAROTIDECTOMY     REMOVAL OF CEMENTED SPACER KNEE Left 08/25/2023   Procedure: Knee spacer removal;  Surgeon: Adonica Hoose, MD;  Location: [facility];  Service: Orthopedics;  Laterality: Left;  350   TONSILLECTOMY     TOTAL KNEE ARTHROPLASTY Left 11/04/2022   Procedure: TOTAL KNEE ARTHROPLASTY;  Surgeon: Orvan Blanch, MD;  Location: [facility];  Service: Orthopedics;  Laterality: Left;   TOTAL KNEE REVISION Left 08/25/2023   Procedure: TOTAL KNEE REVISION;  Surgeon: Adonica Hoose, MD;  Location: [facility];  Service: Orthopedics;  Laterality: Left;  350    Social History:  reports that she quit smoking about 3 years ago. Her smoking use included cigarettes. She started smoking about 13 years ago. She has a 5 pack-year smoking history. She has never used smokeless tobacco. She reports that she does not currently use alcohol. She reports that she does not currently use drugs.   Allergies  Allergen Reactions   Belbuca [Buprenorphine Hcl] Other (See Comments)    " Horrible headache"    Pregabalin Other (See Comments)    Paralysis present (finding)- not tolerated  Pt stated she could not move her body but she could think   Compazine [Prochlorperazine] Anxiety   Droperidol Anxiety   Ergotamine-Caffeine Anxiety and Hypertension    Increased systolic arterial pressure (finding)     Family History  Problem Relation Age of Onset   Hypertension Mother      Prior to Admission medications   Medication Sig Start Date End Date  Taking? Authorizing Provider  albuterol (VENTOLIN HFA) 108 (90 Base) MCG/ACT inhaler Inhale 2 puffs into the lungs every 6 (six) hours as needed for wheezing or shortness of breath. 05/12/21  Yes [provider]  ALPRAZolam (XANAX) 1 MG tablet Take 1 tablet (1 mg total) by mouth every 6 (six) hours as needed for anxiety or sleep. 03/16/23  Yes Earnie Gola, PA-C  ascorbic acid (VITAMIN C) 1000 MG tablet Take 1,000 mg by mouth daily.   Yes [provider]  b complex vitamins capsule Take 1 capsule by mouth daily.   Yes [provider]  cholecalciferol (VITAMIN D3) 25 MCG (1000 UNIT) tablet Take 5,000 Units by mouth daily.   Yes [provider]  clotrimazole-betamethasone (LOTRISONE) cream Apply 1 Application topically 2 (two) times daily as needed (for irritation under the breasts or abdominal folds).   Yes [provider]  DULoxetine (CYMBALTA) 60 MG capsule Take 60 mg by mouth at bedtime. 09/14/18  Yes [provider]  famotidine (PEPCID) 20 MG tablet TAKE 1 TABLET BY MOUTH AFTER  SUPPER Patient taking differently: Take 20 mg by mouth at bedtime. 04/01/23  Yes [name], MD  Ferrous Fumarate-Folic Acid 324-1 MG TABS Take 1 tablet by mouth daily. Hematinic 02/29/20  Yes [provider]  fluconazole (DIFLUCAN) 200 MG tablet Take 200 mg by mouth daily. 06/25/23  Yes [provider]  gabapentin (NEURONTIN) 400 MG capsule Take 400 mg by mouth 2 (two) times daily. 08/21/22  Yes [provider]  morphine (MSIR) 15 MG tablet Take 15 mg by mouth 3 (three) times daily as needed for moderate pain (pain score 4-6). 12/06/23  Yes [provider]  naloxone (NARCAN) nasal spray 4 mg/0.1 mL Place 1 spray into the nose once as needed (accidental over dose). 10/16/21  Yes [provider]  nutrition supplement, JUVEN, (JUVEN) PACK Take 1 packet by mouth 2 (two) times daily between meals.   Yes [provider]   ondansetron (ZOFRAN) 4 MG tablet Take 1 tablet (4 mg total) by mouth every 8 (eight) hours as needed for nausea or vomiting. 08/31/23 08/30/24 Yes Hill, Alain Honey, PA-C  pantoprazole (PROTONIX) 40 MG tablet Take 40 mg by mouth daily with breakfast. 06/30/16  Yes [provider]  promethazine (PHENERGAN) 25 MG tablet Take 25 mg by mouth every 6 (six) hours as needed for nausea or vomiting.   Yes [provider]  tiZANidine (ZANAFLEX) 2 MG tablet Take 2 mg by mouth every 12 (twelve) hours as needed for muscle spasms. 02/29/24  Yes [provider]  XARELTO 20 MG TABS tablet Take 20 mg by mouth at bedtime. 08/21/21  Yes [provider]  zinc gluconate 50 MG tablet Take 50 mg by mouth daily.   Yes [provider]    Physical Exam: BP (!) 146/64   Pulse 65   Temp 98.8 F (37.1 C) (Oral)   Resp 16   Ht 5\' 5"  (1.651 m)   Wt 107.3 kg   SpO2 93%   BMI 39.36 kg/m   General: 68 y.o. year-old female well developed well nourished in no acute distress.  Alert and oriented x3. HEENT: NCAT, EOMI Neck: Supple, trachea medial Cardiovascular: Regular rate and rhythm with no rubs or gallops.  No thyromegaly or JVD noted.  No lower extremity edema. 2/4 pulses in all 4 extremities. Respiratory: Clear to auscultation with no wheezes or rales. Good inspiratory effort. Abdomen: Soft, tender to palpation of left upper and lower quadrants and epigastric area Nondistended with normal bowel sounds x4 quadrants. Muskuloskeletal: No cyanosis, clubbing or edema noted bilaterally Neuro: CN II-XII intact, strength 5/5 x 4, sensation, reflexes intact Skin: No ulcerative lesions noted or rashes Psychiatry: Judgement and insight appear normal. Mood is appropriate for condition and setting          Labs on Admission:  Basic Metabolic Panel: Recent Labs  Lab 03/29/24 1803  NA 140  K 3.2*  CL 93*  CO2 30  GLUCOSE 119*  BUN 11  CREATININE 0.57  CALCIUM 9.7   Liver Function  Tests: Recent Labs  Lab 03/29/24 1803  AST 25  ALT 20  ALKPHOS 99  BILITOT 0.8  PROT 8.1  ALBUMIN 3.8   Recent Labs  Lab 03/29/24 1803  LIPASE 21   No results for input(s): "AMMONIA" in the last 168 hours. CBC: Recent Labs  Lab 03/29/24 1803  WBC 12.5*  HGB 16.3*  HCT 51.0*  MCV 94.4  PLT 187   Cardiac Enzymes: No results for input(s): "CKTOTAL", "CKMB", "CKMBINDEX", "TROPONINI"  in the last 168 hours.  BNP (last 3 results) No results for input(s): "BNP" in the last 8760 hours.  ProBNP (last 3 results) No results for input(s): "PROBNP" in the last 8760 hours.  CBG: No results for input(s): "GLUCAP" in the last 168 hours.  Radiological Exams on Admission: CT ABDOMEN PELVIS W CONTRAST Result Date: 03/29/2024 CLINICAL DATA:  Abdominal pain, acute, nonlocalized, with nausea, vomiting and diarrhea for 3 days. EXAM: CT ABDOMEN AND PELVIS WITH CONTRAST TECHNIQUE: Multidetector CT imaging of the abdomen and pelvis was performed using the standard protocol following bolus administration of intravenous contrast. RADIATION DOSE REDUCTION: This exam was performed according to the departmental dose-optimization program which includes automated exposure control, adjustment of the mA and/or kV according to patient size and/or use of iterative reconstruction technique. CONTRAST:  100mL OMNIPAQUE IOHEXOL 300 MG/ML  SOLN COMPARISON:  CTs without contrast 01/27/2024 and 11/16/2023. FINDINGS: Lower chest: There are old postsurgical changes at the esophageal hiatus with a moderate-sized to large esophageal hiatal hernia with about half of the stomach intrathoracic. Lung bases remain clear.  The cardiac size is normal. Hepatobiliary: No focal liver abnormality is seen. The liver is mildly steatotic diffusely. No calcified gallstones, gallbladder wall thickening, or biliary dilatation. Pancreas: There is fatty infiltration partial glandular atrophy. No mass enhancement. No inflammatory change. No  ductal dilatation. Spleen: Normal. Adrenals/Urinary Tract: There is no adrenal mass. There are occasional bilateral subcentimeter cortical too small to characterize Bosniak 2 renal cysts. No follow-up imaging is recommended. No renal mass enhancement is seen. A 2 mm nonobstructing caliceal stone again is noted in the inferior pole of the right kidney. No nephrolithiasis is seen on the left. No ureteral stones or hydronephrosis bilaterally. The kidneys excrete symmetrically on the delayed images. The bladder is unremarkable for the degree of distention. Stomach/Bowel: There is a partially visible hiatal hernia, inverted partially intrathoracic stomach. There are chronic thickened folds in herniated stomach. There is jejunal fold thickening consistent with nonspecific enteritis or enteropathy. No bowel dilatation is seen.  The appendix is normal. The wall of the colon is normal thickness. There is scattered diverticulosis. A mixture of hazy and linear stranding extends anterior and inferior to the anterior wall of the mid transverse colon and could be due to a focal diverticulitis or other unrelated inflammatory process, or could be due to trauma with an evolving omental infarct. Also, the linear densities associated with the stranding could be due to attempted fistula formation but do not connect with any of the underlying bowel. This was not seen on the 2 prior studies, including most recently 2 months ago. There is no free air in the area or elsewhere, and no localizing fluid collection. This is best seen on series 2 axial images 34-44. No other focal reactive or inflammatory process is seen. Vascular/Lymphatic: Stable infrarenal IVC filter. Minimal aortic to left iliac atherosclerosis. No AAA. No adenopathy. Reproductive: Status post hysterectomy. No adnexal masses. Other: Upper abdominal ventral hernia repair again noted without evidence of hernia recurrence. No free fluid, free hemorrhage or free air.  Musculoskeletal: Osteopenia, multilevel degenerative discs and spondylosis lumbar spine with mild chronic wedging of the T12 and L1 vertebral bodies. No acute or other significant osseous findings. IMPRESSION: 1. A mixture of hazy and linear stranding extends anterior and inferior to the anterior wall of the mid transverse colon, and could be due to a focal diverticulitis or other unrelated inflammatory process, or could be due to trauma with an evolving omental infarct.  2. Also, The linear densities associated with the stranding could be due to attempted fistula formation but do not connect with any of the underlying bowel. This was not seen on the 2 prior studies, including most recently 2 months ago. There is no free air or localizing fluid collection. 3. Jejunal fold thickening consistent with nonspecific enteritis or enteropathy. 4. Moderate-sized to large esophageal hiatal hernia with about half of the stomach intrathoracic. Chronic thickened folds in the herniated stomach. 5. Mild hepatic steatosis. 6. 2 mm nonobstructing right renal calyceal stone. 7. Aortic atherosclerosis. 8. Osteopenia and degenerative change. 9. Stable infrarenal IVC filter. Electronically Signed   By: Almira Bar M.D.   On: 03/29/2024 21:32   DG Chest Portable 1 View Result Date: 03/29/2024 CLINICAL DATA:  chest pain EXAM: PORTABLE CHEST 1 VIEW COMPARISON:  Chest x-ray 11/16/2023 FINDINGS: The heart and mediastinal contours are unchanged. Patient is rotated. No focal consolidation. No pulmonary edema. No pleural effusion. No pneumothorax. No acute osseous abnormality. IMPRESSION: No active disease. Electronically Signed   By: Tish Frederickson M.D.   On: 03/29/2024 20:14    EKG: I independently viewed the EKG done and my findings are as followed: Sinus rhythm at rate of 89 bpm with prolonged QTc of 538 ms  Assessment/Plan Present on Admission:  Acute diverticulitis  Chronic respiratory failure with hypoxia (HCC)  COPD  (chronic obstructive pulmonary disease) (HCC)  History of pulmonary embolism  Gastroesophageal reflux disease without esophagitis  Generalized anxiety disorder  Principal Problem:   Acute diverticulitis Active Problems:   Chronic respiratory failure with hypoxia (HCC)   COPD (chronic obstructive pulmonary disease) (HCC)   History of pulmonary embolism   Gastroesophageal reflux disease without esophagitis   Generalized anxiety disorder   Abdominal pain   Vomiting   Acute diarrhea   Hypokalemia   Prolonged QT interval  Abdominal pain and vomiting due to acute diverticulitis Continue IV hydration Patient was treated with IV ceftriaxone and metronidazole, we shall continue same at this time  Continue IV Dilaudid 0.5 mg every 3 hours as needed for moderate to severe pain Continue IV Compazine p.r.n. for nausea or vomiting Continue clear liquid diet with plan to advanced diet as tolerated  Acute diarrhea Patient has not had any diarrhea since arrival to the ED Consider GI stool panel/C. difficile if patient continues to have diarrhea  Hypokalemia K+ 3.2, this was replenished  Prolonged QT interval QTc 538 ms Avoid QT prolonging drugs K+ 3.2, this was replenished Magnesium level will be checked Repeat EKG in the morning  Chronic respiratory failure with hypoxia Continue supplemental oxygen via [place] at 2 LPM per home regimen  COPD (not in exacerbation) Continue albuterol per home regimen  History of pulmonary embolus (PE) Continue Xarelto    GERD without esophagitis Continue home regimen of pantoprazole and famotidine   Generalized anxiety disorder Continue Xanax per home regimen   DVT prophylaxis: Xarelto   Family Communication: None at bedside   Advance Care Planning:   Code Status: Full Code   Consults: None  Severity of Illness: The appropriate patient status for this patient is INPATIENT. Inpatient status is judged to be reasonable and necessary in order  to provide the required intensity of service to ensure the patient's safety. The patient's presenting symptoms, physical exam findings, and initial radiographic and laboratory data in the context of their chronic comorbidities is felt to place them at high risk for further clinical deterioration. Furthermore, it is not anticipated that the patient  will be medically stable for discharge from the hospital within 2 midnights of admission.   * I certify that at the point of admission it is my clinical judgment that the patient will require inpatient hospital care spanning beyond 2 midnights from the point of admission due to high intensity of service, high risk for further deterioration and high frequency of surveillance required.*  Author: Kairyn Olmeda, DO 03/30/2024 3:32 AM  For on call review [email or web address].

## 2024-03-29 NOTE — ED Triage Notes (Signed)
 Pt reports:  Nausea Started Sunday Diarrhea Started Sunday Vomiting Started Sunday Headache  Started Sunday Improving at the moment

## 2024-03-30 DIAGNOSIS — E876 Hypokalemia: Secondary | ICD-10-CM | POA: Insufficient documentation

## 2024-03-30 DIAGNOSIS — R197 Diarrhea, unspecified: Secondary | ICD-10-CM | POA: Insufficient documentation

## 2024-03-30 DIAGNOSIS — K5792 Diverticulitis of intestine, part unspecified, without perforation or abscess without bleeding: Secondary | ICD-10-CM | POA: Diagnosis not present

## 2024-03-30 DIAGNOSIS — R1110 Vomiting, unspecified: Secondary | ICD-10-CM | POA: Insufficient documentation

## 2024-03-30 DIAGNOSIS — R9431 Abnormal electrocardiogram [ECG] [EKG]: Secondary | ICD-10-CM | POA: Insufficient documentation

## 2024-03-30 DIAGNOSIS — R109 Unspecified abdominal pain: Secondary | ICD-10-CM | POA: Insufficient documentation

## 2024-03-30 LAB — COMPREHENSIVE METABOLIC PANEL WITH GFR
ALT: 17 U/L (ref 0–44)
AST: 21 U/L (ref 15–41)
Albumin: 3.1 g/dL — ABNORMAL LOW (ref 3.5–5.0)
Alkaline Phosphatase: 81 U/L (ref 38–126)
Anion gap: 11 (ref 5–15)
BUN: 9 mg/dL (ref 8–23)
CO2: 30 mmol/L (ref 22–32)
Calcium: 8.6 mg/dL — ABNORMAL LOW (ref 8.9–10.3)
Chloride: 98 mmol/L (ref 98–111)
Creatinine, Ser: 0.6 mg/dL (ref 0.44–1.00)
GFR, Estimated: >60 mL/min (ref >60)
Glucose, Bld: 103 mg/dL — ABNORMAL HIGH (ref 70–99)
Potassium: 3.4 mmol/L — ABNORMAL LOW (ref 3.5–5.1)
Sodium: 139 mmol/L (ref 135–145)
Total Bilirubin: 0.7 mg/dL (ref 0.0–1.2)
Total Protein: 6.7 g/dL (ref 6.5–8.1)

## 2024-03-30 LAB — HIV ANTIBODY (ROUTINE TESTING W REFLEX): HIV Screen 4th Generation wRfx: NONREACTIVE

## 2024-03-30 LAB — CBC
HCT: 47.6 % — ABNORMAL HIGH (ref 36.0–46.0)
Hemoglobin: 14.9 g/dL (ref 12.0–15.0)
MCH: 30.2 pg (ref 26.0–34.0)
MCHC: 31.3 g/dL (ref 30.0–36.0)
MCV: 96.6 fL (ref 80.0–100.0)
Platelets: 158 10*3/uL (ref 150–400)
RBC: 4.93 MIL/uL (ref 3.87–5.11)
RDW: 14.1 % (ref 11.5–15.5)
WBC: 13 10*3/uL — ABNORMAL HIGH (ref 4.0–10.5)
nRBC: 0 % (ref 0.0–0.2)

## 2024-03-30 LAB — MAGNESIUM: Magnesium: 1.8 mg/dL (ref 1.7–2.4)

## 2024-03-30 LAB — PHOSPHORUS: Phosphorus: 2.5 mg/dL (ref 2.5–4.6)

## 2024-03-30 MED ORDER — ALBUTEROL SULFATE (2.5 MG/3ML) 0.083% IN NEBU: 2.5000 mg | INHALATION_SOLUTION | Freq: Four times a day (QID) | RESPIRATORY_TRACT | Status: DC | PRN

## 2024-03-30 MED ORDER — NYSTATIN 100000 UNIT/GM EX POWD
Freq: Three times a day (TID) | CUTANEOUS | Status: DC
  Administered 2024-04-01: 1 via TOPICAL
  Filled 2024-03-30 (×3): qty 15

## 2024-03-30 MED ORDER — RIVAROXABAN 20 MG PO TABS
20.0000 mg | ORAL_TABLET | Freq: Every day | ORAL | Status: DC
  Administered 2024-03-30 – 2024-03-31 (×3): 20 mg via ORAL
  Filled 2024-03-30 (×3): qty 1

## 2024-03-30 MED ORDER — OXYCODONE HCL 5 MG PO TABS
5.0000 mg | ORAL_TABLET | Freq: Four times a day (QID) | ORAL | Status: DC | PRN
  Administered 2024-03-30: 5 mg via ORAL
  Filled 2024-03-30: qty 1

## 2024-03-30 MED ORDER — PROCHLORPERAZINE EDISYLATE 10 MG/2ML IJ SOLN
10.0000 mg | Freq: Four times a day (QID) | INTRAMUSCULAR | Status: DC | PRN
  Administered 2024-03-31 – 2024-04-01 (×3): 10 mg via INTRAVENOUS
  Filled 2024-03-30 (×3): qty 2

## 2024-03-30 MED ORDER — ALPRAZOLAM 1 MG PO TABS
1.0000 mg | ORAL_TABLET | Freq: Four times a day (QID) | ORAL | Status: DC | PRN
  Administered 2024-03-30 – 2024-03-31 (×3): 1 mg via ORAL
  Filled 2024-03-30 (×3): qty 1

## 2024-03-30 MED ORDER — FAMOTIDINE 20 MG PO TABS
20.0000 mg | ORAL_TABLET | Freq: Every day | ORAL | Status: DC
  Administered 2024-03-30 – 2024-04-01 (×3): 20 mg via ORAL
  Filled 2024-03-30 (×3): qty 1

## 2024-03-30 MED ORDER — PANTOPRAZOLE SODIUM 40 MG PO TBEC
40.0000 mg | DELAYED_RELEASE_TABLET | Freq: Every day | ORAL | Status: DC
  Administered 2024-03-30 – 2024-04-02 (×4): 40 mg via ORAL
  Filled 2024-03-30 (×4): qty 1

## 2024-03-30 MED ORDER — POTASSIUM CHLORIDE CRYS ER 20 MEQ PO TBCR
40.0000 meq | EXTENDED_RELEASE_TABLET | Freq: Once | ORAL | Status: AC
  Administered 2024-03-30: 40 meq via ORAL
  Filled 2024-03-30: qty 2

## 2024-03-30 MED ORDER — HYDROMORPHONE HCL 1 MG/ML IJ SOLN
0.5000 mg | INTRAMUSCULAR | Status: DC | PRN
  Administered 2024-03-30 – 2024-04-02 (×13): 0.5 mg via INTRAVENOUS
  Filled 2024-03-30 (×13): qty 0.5

## 2024-03-30 MED ORDER — ACETAMINOPHEN 325 MG PO TABS
650.0000 mg | ORAL_TABLET | Freq: Four times a day (QID) | ORAL | Status: DC | PRN
  Administered 2024-03-30 – 2024-04-02 (×5): 650 mg via ORAL
  Filled 2024-03-30 (×4): qty 2

## 2024-03-30 MED ORDER — ACETAMINOPHEN 650 MG RE SUPP: 650.0000 mg | Freq: Four times a day (QID) | RECTAL | Status: DC | PRN

## 2024-03-30 MED ORDER — METRONIDAZOLE 500 MG/100ML IV SOLN
500.0000 mg | Freq: Two times a day (BID) | INTRAVENOUS | Status: DC
  Administered 2024-03-30 – 2024-04-02 (×7): 500 mg via INTRAVENOUS
  Filled 2024-03-30 (×7): qty 100

## 2024-03-30 MED ORDER — HYDROCODONE-ACETAMINOPHEN 5-325 MG PO TABS: 1.0000 | ORAL_TABLET | Freq: Four times a day (QID) | ORAL | Status: DC | PRN

## 2024-03-30 MED ORDER — KETOROLAC TROMETHAMINE 60 MG/2ML IM SOLN
30.0000 mg | Freq: Once | INTRAMUSCULAR | Status: DC
  Filled 2024-03-30: qty 2

## 2024-03-30 MED ORDER — SODIUM CHLORIDE 0.9 % IV SOLN
2.0000 g | INTRAVENOUS | Status: DC
  Administered 2024-03-30 – 2024-04-01 (×3): 2 g via INTRAVENOUS
  Filled 2024-03-30 (×3): qty 20

## 2024-03-30 MED ORDER — SODIUM CHLORIDE 0.9 % IV SOLN: INTRAVENOUS | Status: AC | PRN

## 2024-03-30 NOTE — Progress Notes (Addendum)
 Has had no diarrhea or vomiting since admit to floor.  Requested pain med for headache .  Nystatin applied to [facility].  SDD's applied.  Has ortho shoe for left great toe fracture. States lives at home with husband as primary care giver, states she doesn't walk but husband assists her to [facility].

## 2024-03-30 NOTE — Progress Notes (Signed)
 PROGRESS NOTE    Norma Anthony  ZOX:[id number] DOB: 06-Mar-1956 DOA: 03/29/2024 PCP: [facility]   Brief Narrative:     Norma Anthony is a 68 y.o. 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  She has noted to have acute diverticulitis and has been started on Rocephin and Flagyl.  Assessment & Plan:   Principal Problem:   Acute diverticulitis Active Problems:   Chronic respiratory failure with hypoxia (HCC)   COPD (chronic obstructive pulmonary disease) (HCC)   History of pulmonary embolism   Gastroesophageal reflux disease without esophagitis   Generalized anxiety disorder   Abdominal pain   Vomiting   Acute diarrhea   Hypokalemia   Prolonged QT interval  Assessment and Plan:   Abdominal pain and vomiting due to acute diverticulitis Continue IV hydration Patient was treated with IV ceftriaxone and metronidazole, we shall continue same at this time  Continue IV Dilaudid 0.5 mg every 3 hours as needed for moderate to severe pain Continue IV Compazine p.r.n. for nausea or vomiting Continue clear liquid diet with plan to advanced diet as tolerated   Acute diarrhea-resolved Patient has not had any diarrhea since arrival to the ED Consider GI stool panel/C. difficile if patient continues to have diarrhea   Hypokalemia K+ 3.2, this was replenished   Prolonged QT interval QTc 538 ms Avoid QT prolonging drugs K+ 3.2, this was replenished Magnesium level will be checked Repeat EKG in the morning   Chronic respiratory failure with hypoxia Continue supplemental oxygen via [place] at 2 LPM per home regimen   COPD (not in exacerbation) Continue albuterol per home  regimen   History of pulmonary embolus (PE) Continue Xarelto    GERD without esophagitis Continue home regimen of pantoprazole and famotidine   Generalized anxiety disorder Continue Xanax per home regimen  Obesity class II -BMI 39.36    DVT prophylaxis:rivaroxaban Code Status: Full Family Communication: None at bedside Disposition Plan:  Status is: Inpatient Remains inpatient appropriate because: Need for IV medications.  Consultants:  None  Procedures:  None  Antimicrobials:  Anti-infectives (From admission, onward)    Start     Dose/Rate Route Frequency Ordered Stop   03/30/24 2200  cefTRIAXone (ROCEPHIN) 2 g in sodium chloride 0.9 % 100 mL IVPB       Placed in "And" Linked Group   2 g 200 mL/hr over 30 Minutes Intravenous Every 24 hours 03/30/24 0634     03/30/24 1000  metroNIDAZOLE (FLAGYL) IVPB 500 mg       Placed in "And" Linked Group   500 mg 100 mL/hr over 60 Minutes Intravenous Every 12 hours 03/30/24 0634     03/29/24 2230  cefTRIAXone (ROCEPHIN) 2 g in sodium chloride 0.9 % 100 mL IVPB       Placed in "And" Linked Group   2 g 200 mL/hr over 30 Minutes Intravenous  Once 03/29/24 2224 03/29/24 2321   03/29/24 2230  metroNIDAZOLE (FLAGYL) IVPB 500 mg       Placed in "And" Linked Group   500 mg 100 mL/hr over 60 Minutes Intravenous  Once 03/29/24 2224 03/30/24 0108       Subjective: Patient seen and evaluated today with complaints of abdominal pain rated  7/10. No acute concerns or events noted overnight.  Objective: Vitals:   03/29/24 2230 03/29/24 2354 03/30/24 0350 03/30/24 0755  BP: (!) 169/90 (!) 146/64 (!) 161/90 (!) 164/82  Pulse: 80 65 86 85  Resp: 11 16 17    Temp:  98.8 F (37.1 C) 98.7 F (37.1 C) 100 F (37.8 C)  TempSrc:  Oral Oral Oral  SpO2: 97% 93% 100% 93%  Weight:  107.3 kg    Height:  5\' 5"  (1.651 m)      Intake/Output Summary (Last 24 hours) at 03/30/2024 0818 Last data filed at 03/30/2024 0352 Gross per 24 hour  Intake  219.46 ml  Output 300 ml  Net -80.54 ml   Filed Weights   03/29/24 1619 03/29/24 2354  Weight: 117.9 kg 107.3 kg    Examination:  General exam: Appears calm and comfortable  Respiratory system: Clear to auscultation. Respiratory effort normal. Cardiovascular system: S1 & S2 heard, RRR.  Gastrointestinal system: Abdomen is soft Central nervous system: Alert and awake Extremities: No edema Skin: No significant lesions noted Psychiatry: Flat affect.    Data Reviewed: I have personally reviewed following labs and imaging studies  CBC: Recent Labs  Lab 03/29/24 1803 03/30/24 0415  WBC 12.5* 13.0*  HGB 16.3* 14.9  HCT 51.0* 47.6*  MCV 94.4 96.6  PLT 187 158   Basic Metabolic Panel: Recent Labs  Lab 03/29/24 1803 03/30/24 0415  NA 140 139  K 3.2* 3.4*  CL 93* 98  CO2 30 30  GLUCOSE 119* 103*  BUN 11 9  CREATININE 0.57 0.60  CALCIUM 9.7 8.6*  MG  --  1.8  PHOS  --  2.5   GFR: Estimated Creatinine Clearance: 83.1 mL/min (by C-G formula based on SCr of 0.6 mg/dL). Liver Function Tests: Recent Labs  Lab 03/29/24 1803 03/30/24 0415  AST 25 21  ALT 20 17  ALKPHOS 99 81  BILITOT 0.8 0.7  PROT 8.1 6.7  ALBUMIN 3.8 3.1*   Recent Labs  Lab 03/29/24 1803  LIPASE 21   No results for input(s): "AMMONIA" in the last 168 hours. Coagulation Profile: No results for input(s): "INR", "PROTIME" in the last 168 hours. Cardiac Enzymes: No results for input(s): "CKTOTAL", "CKMB", "CKMBINDEX", "TROPONINI" in the last 168 hours. BNP (last 3 results) No results for input(s): "PROBNP" in the last 8760 hours. HbA1C: No results for input(s): "HGBA1C" in the last 72 hours. CBG: No results for input(s): "GLUCAP" in the last 168 hours. Lipid Profile: No results for input(s): "CHOL", "HDL", "LDLCALC", "TRIG", "CHOLHDL", "LDLDIRECT" in the last 72 hours. Thyroid Function Tests: No results for input(s): "TSH", "T4TOTAL", "FREET4", "T3FREE", "THYROIDAB" in the last 72  hours. Anemia Panel: No results for input(s): "VITAMINB12", "FOLATE", "FERRITIN", "TIBC", "IRON", "RETICCTPCT" in the last 72 hours. Sepsis Labs: No results for input(s): "PROCALCITON", "LATICACIDVEN" in the last 168 hours.  No results found for this or any previous visit (from the past 240 hours).       Radiology Studies: CT ABDOMEN PELVIS W CONTRAST Result Date: 03/29/2024 CLINICAL DATA:  Abdominal pain, acute, nonlocalized, with nausea, vomiting and diarrhea for 3 days. EXAM: CT ABDOMEN AND PELVIS WITH CONTRAST TECHNIQUE: Multidetector CT imaging of the abdomen and pelvis was performed using the standard protocol following bolus administration of intravenous contrast. RADIATION DOSE REDUCTION: This exam was performed according to the departmental dose-optimization program which includes automated exposure control, adjustment of the mA and/or kV according to patient size and/or use of iterative reconstruction technique. CONTRAST:  100mL OMNIPAQUE IOHEXOL 300 MG/ML  SOLN COMPARISON:  CTs without contrast 01/27/2024 and 11/16/2023. FINDINGS: Lower chest: There are old postsurgical changes at the esophageal hiatus with a moderate-sized to large esophageal hiatal hernia with about half of the stomach intrathoracic. Lung bases remain clear.  The cardiac size is normal. Hepatobiliary: No focal liver abnormality is seen. The liver is mildly steatotic diffusely. No calcified gallstones, gallbladder wall thickening, or biliary dilatation. Pancreas: There is fatty infiltration partial glandular atrophy. No mass enhancement. No inflammatory change. No ductal dilatation. Spleen: Normal. Adrenals/Urinary Tract: There is no adrenal mass. There are occasional bilateral subcentimeter cortical too small to characterize Bosniak 2 renal cysts. No follow-up imaging is recommended. No renal mass enhancement is seen. A 2 mm nonobstructing caliceal stone again is noted in the inferior pole of the right kidney. No  nephrolithiasis is seen on the left. No ureteral stones or hydronephrosis bilaterally. The kidneys excrete symmetrically on the delayed images. The bladder is unremarkable for the degree of distention. Stomach/Bowel: There is a partially visible hiatal hernia, inverted partially intrathoracic stomach. There are chronic thickened folds in herniated stomach. There is jejunal fold thickening consistent with nonspecific enteritis or enteropathy. No bowel dilatation is seen.  The appendix is normal. The wall of the colon is normal thickness. There is scattered diverticulosis. A mixture of hazy and linear stranding extends anterior and inferior to the anterior wall of the mid transverse colon and could be due to a focal diverticulitis or other unrelated inflammatory process, or could be due to trauma with an evolving omental infarct. Also, the linear densities associated with the stranding could be due to attempted fistula formation but do not connect with any of the underlying bowel. This was not seen on the 2 prior studies, including most recently 2 months ago. There is no free air in the area or elsewhere, and no localizing fluid collection. This is best seen on series 2 axial images 34-44. No other focal reactive or inflammatory process is seen. Vascular/Lymphatic: Stable infrarenal IVC filter. Minimal aortic to left iliac atherosclerosis. No AAA. No adenopathy. Reproductive: Status post hysterectomy. No adnexal masses. Other: Upper abdominal ventral hernia repair again noted without evidence of hernia recurrence. No free fluid, free hemorrhage or free air. Musculoskeletal: Osteopenia, multilevel degenerative discs and spondylosis lumbar spine with mild chronic wedging of the T12 and L1 vertebral bodies. No acute or other significant osseous findings. IMPRESSION: 1. A mixture of hazy and linear stranding extends anterior and inferior to the anterior wall of the mid transverse colon, and could be due to a focal  diverticulitis or other unrelated inflammatory process, or could be due to trauma with an evolving omental infarct. 2. Also, The linear densities associated with the stranding could be due to attempted fistula formation but do not connect with any of the underlying bowel. This was not seen on the 2 prior studies, including most recently 2 months ago. There is no free air or localizing fluid collection. 3. Jejunal fold thickening consistent with nonspecific enteritis or enteropathy. 4. Moderate-sized to large esophageal hiatal hernia with about half of the stomach intrathoracic. Chronic thickened folds in the herniated stomach. 5. Mild hepatic steatosis. 6. 2 mm nonobstructing right renal calyceal stone. 7. Aortic atherosclerosis. 8. Osteopenia and degenerative change. 9. Stable infrarenal IVC filter. Electronically Signed   By: Denman Fischer M.D.   On: 03/29/2024 21:32   DG Chest Portable 1 View Result Date: 03/29/2024 CLINICAL DATA:  chest pain EXAM: PORTABLE CHEST 1  VIEW COMPARISON:  Chest x-ray 11/16/2023 FINDINGS: The heart and mediastinal contours are unchanged. Patient is rotated. No focal consolidation. No pulmonary edema. No pleural effusion. No pneumothorax. No acute osseous abnormality. IMPRESSION: No active disease. Electronically Signed   By: Morgane  Naveau M.D.   On: 03/29/2024 20:14        Scheduled Meds:  famotidine  20 mg Oral QHS   ketorolac  30 mg Intramuscular Once   nystatin   Topical TID   pantoprazole  40 mg Oral Q breakfast   potassium chloride SA  40 mEq Oral Once   rivaroxaban  20 mg Oral QHS   Continuous Infusions:  sodium chloride 10 mL/hr at 03/30/24 0002   cefTRIAXone (ROCEPHIN)  IV     And   metronidazole       LOS: 1 day    Time spent: 55 minutes    Obie Silos Loran Rock, DO [facility]  If 7PM-7AM, please contact night-coverage [email or web address] 03/30/2024, 8:18 AM

## 2024-03-30 NOTE — TOC Initial Note (Signed)
 Transition of Care [facility]) - Initial/Assessment Note    Patient Details  Name: Norma Anthony MRN: [id number] Date of Birth: 1956-11-16  Transition of Care [facility]) CM/SW Contact:    Grandville Lax, LCSWA Phone Number: 03/30/2024, 8:50 AM  Clinical Narrative:                 Pt is high risk for readmission. CSW spoke with pts spouse to complete assessment. Pts spouse states that he assists pt with completing all ADLs. He provides transportation to appointments when needed. Pt has had HH with [facility] in the past but not currently. Pt has a walker and wheelchair to use when needed. Pt has home O2 that she wears PRN supplied through [employer]. TOC to follow.   Expected Discharge Plan: Home/Self Care Barriers to Discharge: Continued Medical Work up   Patient Goals and CMS Choice Patient states their goals for this hospitalization and ongoing recovery are:: return home CMS Medicare.gov Compare Post Acute Care list provided to:: Patient Represenative (must comment) Choice offered to / list presented to : Spouse      Expected Discharge Plan and Services In-house Referral: Clinical Social Work Discharge Planning Services: CM Consult   Living arrangements for the past 2 months: Single Family Home                                      Prior Living Arrangements/Services Living arrangements for the past 2 months: Single Family Home Lives with:: Spouse Patient language and need for interpreter reviewed:: Yes Do you feel safe going back to the place where you live?: Yes      Need for Family Participation in Patient Care: Yes (Comment) Care giver support system in place?: Yes (comment) Current home services: DME Criminal Activity/Legal Involvement Pertinent to Current Situation/Hospitalization: No - Comment as needed  Activities of Daily Living   ADL Screening (condition at time of admission) Independently performs ADLs?: No Does the patient have a NEW difficulty with  bathing/dressing/toileting/self-feeding that is expected to last >3 days?: No Does the patient have a NEW difficulty with getting in/out of bed, walking, or climbing stairs that is expected to last >3 days?: No Does the patient have a NEW difficulty with communication that is expected to last >3 days?: No Is the patient deaf or have difficulty hearing?: No Does the patient have difficulty seeing, even when wearing glasses/contacts?: No Does the patient have difficulty concentrating, remembering, or making decisions?: No  Permission Sought/Granted                  Emotional Assessment Appearance:: Appears stated age Attitude/Demeanor/Rapport: Engaged Affect (typically observed): Accepting Orientation: : Oriented to Self, Oriented to Place, Oriented to  Time, Oriented to Situation Alcohol / Substance Use: Not Applicable Psych Involvement: No (comment)  Admission diagnosis:  Diverticulitis [K57.92] Acute diverticulitis [K57.92] Patient Active Problem List   Diagnosis Date Noted   Abdominal pain 03/30/2024   Vomiting 03/30/2024   Acute diarrhea 03/30/2024   Hypokalemia 03/30/2024   Prolonged QT interval 03/30/2024   Acute diverticulitis 03/29/2024   Ambulatory dysfunction 11/19/2023   Kidney stones 11/09/2023   Infection of total right knee replacement, 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/25/2023   Long term current use of antibiotics 04/14/2023   Intertriginous dermatitis associated with moisture 04/14/2023   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   Surgical wound dehiscence 03/08/2023   Failed total knee, 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  Hemarthrosis of left knee 12/11/2022   Chronic respiratory failure with hypoxia (HCC) 12/11/2022   Generalized anxiety disorder 12/11/2022   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   DOE (dyspnea on exertion) 04/30/2022   Ex-smoker 04/30/2022   Lung nodule 03/24/2022   Thyroid nodule 03/24/2022   Opioid dependence (HCC) 03/23/2022    COPD (chronic obstructive pulmonary disease) (HCC) 03/22/2022   Obstructive sleep apnea syndrome 07/01/2021   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 & [address] [place] [address] Phone: [phone] Fax: [phone]     Social Drivers of Health (SDOH) Social History: SDOH Screenings   Food Insecurity: No Food Insecurity (03/30/2024)  Housing: Low Risk  (03/30/2024)  Transportation Needs: No Transportation Needs (03/30/2024)  Utilities: Not At Risk (03/30/2024)  Depression (PHQ2-9): Low Risk  (04/07/2023)  Financial Resource Strain: Low Risk  (04/23/2021)   Received from [facility]  Social Connections: Moderately Integrated (03/30/2024)  Stress: Stress Concern Present (04/23/2021)   Received from [facility], [facility]  Tobacco Use: Medium Risk (03/29/2024)   SDOH Interventions:     Readmission Risk Interventions    03/30/2024    8:49 AM 08/28/2023    2:19 PM 03/11/2023    2:01 PM  Readmission Risk Prevention Plan  Transportation Screening Complete Complete Complete  PCP or Specialist Appt within 3-5 Days  Complete   HRI or Home Care Consult  Complete   Social Work Consult for Recovery Care Planning/Counseling  Complete   Palliative Care Screening  Not Applicable   Medication Review Oceanographer) Complete Complete Complete  PCP or Specialist appointment within 3-5 days of discharge   Complete  HRI or Home Care Consult Complete  Complete  SW Recovery Care/Counseling Consult  Complete  Complete  Palliative Care Screening Not Applicable  Not Applicable  Skilled Nursing Facility Not Applicable  Complete

## 2024-03-31 DIAGNOSIS — K5792 Diverticulitis of intestine, part unspecified, without perforation or abscess without bleeding: Secondary | ICD-10-CM | POA: Diagnosis not present

## 2024-03-31 LAB — COMPREHENSIVE METABOLIC PANEL WITH GFR
ALT: 27 U/L (ref 0–44)
AST: 35 U/L (ref 15–41)
Albumin: 2.8 g/dL — ABNORMAL LOW (ref 3.5–5.0)
Alkaline Phosphatase: 67 U/L (ref 38–126)
Anion gap: 11 (ref 5–15)
BUN: 15 mg/dL (ref 8–23)
CO2: 26 mmol/L (ref 22–32)
Calcium: 8.2 mg/dL — ABNORMAL LOW (ref 8.9–10.3)
Chloride: 98 mmol/L (ref 98–111)
Creatinine, Ser: 0.96 mg/dL (ref 0.44–1.00)
GFR, Estimated: >60 mL/min (ref >60)
Glucose, Bld: 125 mg/dL — ABNORMAL HIGH (ref 70–99)
Potassium: 3.7 mmol/L (ref 3.5–5.1)
Sodium: 135 mmol/L (ref 135–145)
Total Bilirubin: 0.4 mg/dL (ref 0.0–1.2)
Total Protein: 6.1 g/dL — ABNORMAL LOW (ref 6.5–8.1)

## 2024-03-31 LAB — CBC
HCT: 37.6 % (ref 36.0–46.0)
Hemoglobin: 11.7 g/dL — ABNORMAL LOW (ref 12.0–15.0)
MCH: 30 pg (ref 26.0–34.0)
MCHC: 31.1 g/dL (ref 30.0–36.0)
MCV: 96.4 fL (ref 80.0–100.0)
Platelets: 214 10*3/uL (ref 150–400)
RBC: 3.9 MIL/uL (ref 3.87–5.11)
RDW: 14.1 % (ref 11.5–15.5)
WBC: 21.6 10*3/uL — ABNORMAL HIGH (ref 4.0–10.5)
nRBC: 0 % (ref 0.0–0.2)

## 2024-03-31 LAB — LACTIC ACID, PLASMA: Lactic Acid, Venous: 1.1 mmol/L (ref 0.5–1.9)

## 2024-03-31 LAB — MAGNESIUM: Magnesium: 1.8 mg/dL (ref 1.7–2.4)

## 2024-03-31 MED ORDER — SODIUM CHLORIDE 0.9 % IV SOLN: INTRAVENOUS | Status: AC

## 2024-03-31 MED ORDER — SODIUM CHLORIDE 0.9 % IV BOLUS
1000.0000 mL | Freq: Once | INTRAVENOUS | Status: AC
  Administered 2024-03-31: 1000 mL via INTRAVENOUS

## 2024-03-31 NOTE — Plan of Care (Signed)

## 2024-03-31 NOTE — Plan of Care (Signed)
  Problem: Education: Goal: Knowledge of General Education information will improve Description: Including pain rating scale, medication(s)/side effects and non-pharmacologic comfort measures Outcome: Progressing   Problem: Clinical Measurements: [place]

## 2024-03-31 NOTE — Discharge Summary (Incomplete)
 Physician Discharge Summary  [place] GNF:[id number] DOB: 1956-12-11 DOA: 03/29/2024  PCP: [facility]  Admit date: 03/29/2024  Discharge date: 03/31/2024 AMA DISCHARGE  Admitted From:Home  Disposition:  AMA DISCHARGE  Discharge Condition:Stable  CODE STATUS: Full  Brief/Interim Summary: Norma Anthony is a 68 y.o. 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  She has noted to have acute diverticulitis and has been started on Rocephin  and Flagyl .   Discharge Diagnoses:  Principal Problem:   Acute diverticulitis Active Problems:   Chronic respiratory failure with hypoxia (HCC)   COPD (chronic obstructive pulmonary disease) (HCC)   History of pulmonary embolism   Gastroesophageal reflux disease without esophagitis   Generalized anxiety disorder   Abdominal pain   Vomiting   Acute diarrhea   Hypokalemia   Prolonged QT interval  Prinicipal discharge diagnosis: Acute diverticulitis.   Allergies  Allergen Reactions   Belbuca  [Buprenorphine  Hcl] Other (See Comments)    " Horrible headache"    Pregabalin Other (See Comments)    Paralysis present (finding)- not tolerated  Pt stated she could not move her body but she could think   Compazine  [Prochlorperazine ] Anxiety   Droperidol Anxiety   Ergotamine-Caffeine Anxiety and Hypertension    Increased systolic arterial pressure (finding)     Consultations: None   Procedures/Studies: CT ABDOMEN PELVIS W CONTRAST Result Date: 03/29/2024 CLINICAL DATA:  Abdominal pain, acute, nonlocalized, with nausea, vomiting and diarrhea for 3 days. EXAM: CT ABDOMEN AND PELVIS WITH CONTRAST TECHNIQUE: Multidetector CT imaging  of the abdomen and pelvis was performed using the standard protocol following bolus administration of intravenous contrast. RADIATION DOSE REDUCTION: This exam was performed according to the departmental dose-optimization program which includes automated exposure control, adjustment of the mA and/or kV according to patient size and/or use of iterative reconstruction technique. CONTRAST:  OMNIPAQUE  IOHEXOL  300 MG/ML  SOLN COMPARISON:  CTs without contrast 01/27/2024 and 11/16/2023. FINDINGS: Lower chest: There are old postsurgical changes at the esophageal hiatus with a moderate-sized to large esophageal hiatal hernia with about half of the stomach intrathoracic. Lung bases remain clear.  The cardiac size is normal. Hepatobiliary: No focal liver abnormality is seen. The liver is mildly steatotic diffusely. No calcified gallstones, gallbladder wall thickening, or biliary dilatation. Pancreas: There is fatty infiltration partial glandular atrophy. No mass enhancement. No inflammatory change. No ductal dilatation. Spleen: Normal. Adrenals/Urinary Tract: There is no adrenal mass. There are occasional bilateral subcentimeter cortical too small to characterize Bosniak 2 renal cysts. No follow-up imaging is recommended. No renal mass enhancement is seen. A 2 mm nonobstructing caliceal stone again is noted in the inferior pole of the right kidney. No nephrolithiasis is seen on the left. No ureteral stones or hydronephrosis bilaterally. The kidneys excrete symmetrically on the delayed images. The bladder is unremarkable for the degree of distention. Stomach/Bowel: There is a partially visible hiatal hernia, inverted partially intrathoracic stomach. There are chronic thickened folds in herniated stomach. There is jejunal fold thickening consistent with nonspecific enteritis or enteropathy. No bowel dilatation is seen.  The appendix is normal. The wall of the colon is normal thickness. There is scattered diverticulosis. A  mixture of hazy and linear stranding extends anterior and inferior to the anterior wall  of the mid transverse colon and could be due to a focal diverticulitis or other unrelated inflammatory process, or could be due to trauma with an evolving omental infarct. Also, the linear densities associated with the stranding could be due to attempted fistula formation but do not connect with any of the underlying bowel. This was not seen on the 2 prior studies, including most recently 2 months ago. There is no free air in the area or elsewhere, and no localizing fluid collection. This is best seen on series 2 axial images 34-44. No other focal reactive or inflammatory process is seen. Vascular/Lymphatic: Stable infrarenal IVC filter. Minimal aortic to left iliac atherosclerosis. No AAA. No adenopathy. Reproductive: Status post hysterectomy. No adnexal masses. Other: Upper abdominal ventral hernia repair again noted without evidence of hernia recurrence. No free fluid, free hemorrhage or free air. Musculoskeletal: Osteopenia, multilevel degenerative discs and spondylosis lumbar spine with mild chronic wedging of the T12 and L1 vertebral bodies. No acute or other significant osseous findings. IMPRESSION: 1. A mixture of hazy and linear stranding extends anterior and inferior to the anterior wall of the mid transverse colon, and could be due to a focal diverticulitis or other unrelated inflammatory process, or could be due to trauma with an evolving omental infarct. 2. Also, The linear densities associated with the stranding could be due to attempted fistula formation but do not connect with any of the underlying bowel. This was not seen on the 2 prior studies, including most recently 2 months ago. There is no free air or localizing fluid collection. 3. Jejunal fold thickening consistent with nonspecific enteritis or enteropathy. 4. Moderate-sized to large esophageal hiatal hernia with about half of the stomach intrathoracic.  Chronic thickened folds in the herniated stomach. 5. Mild hepatic steatosis. 6. 2 mm nonobstructing right renal calyceal stone. 7. Aortic atherosclerosis. 8. Osteopenia and degenerative change. 9. Stable infrarenal IVC filter. Electronically Signed   By: Denman Fischer M.D.   On: 03/29/2024 21:32   DG Chest Portable 1 View Result Date: 03/29/2024 CLINICAL DATA:  chest pain EXAM: PORTABLE CHEST 1 VIEW COMPARISON:  Chest x-ray 11/16/2023 FINDINGS: The heart and mediastinal contours are unchanged. Patient is rotated. No focal consolidation. No pulmonary edema. No pleural effusion. No pneumothorax. No acute osseous abnormality. IMPRESSION: No active disease. Electronically Signed   By: Morgane  Naveau M.D.   On: 03/29/2024 20:14     Discharge Exam: Vitals:   03/31/24 0344 03/31/24 1416  BP: 97/75 117/76  Pulse:  (!) 101  Resp: 18 18  Temp: (!) 97.4 F (36.3 C) 98.4 F (36.9 C)  SpO2: 92% 92%   Vitals:   03/30/24 2206 03/30/24 2338 03/31/24 0344 03/31/24 1416  BP: 106/74 102/63 97/75 117/76  Pulse: (!) 120 (!) 114  (!) 101  Resp: (!) 22 20 18 18   Temp: 97.6 F (36.4 C) 98.3 F (36.8 C) (!) 97.4 F (36.3 C) 98.4 F (36.9 C)  TempSrc: Oral Oral Oral   SpO2: 94% 91% 92% 92%  Weight:      Height:        General: Pt is alert, awake, not in acute distress Cardiovascular: RRR, S1/S2 +, no rubs, no gallops Respiratory: CTA bilaterally, no wheezing, no rhonchi Abdominal: Soft, NT, ND, bowel sounds + Extremities: no edema, no cyanosis    The results of significant diagnostics from this hospitalization (including imaging, microbiology, ancillary and laboratory) are listed below for reference.     Microbiology: No results found for this or  any previous visit (from the past 240 hours).   Labs: BNP (last 3 results) No results for input(s): "BNP" in the last 8760 hours. Basic Metabolic Panel: Recent Labs  Lab 03/29/24 1803 03/30/24 0415 03/31/24 0448  NA 140 139 135  K 3.2* 3.4*  3.7  CL 93* 98 98  CO2 30 30 26   GLUCOSE 119* 103* 125*  BUN 11 9 15   CREATININE 0.57 0.60 0.96  CALCIUM 9.7 8.6* 8.2*  MG  --  1.8 1.8  PHOS  --  2.5  --    Liver Function Tests: Recent Labs  Lab 03/29/24 1803 03/30/24 0415 03/31/24 0448  AST 25 21 35  ALT 20 17 27   ALKPHOS 99 81 67  BILITOT 0.8 0.7 0.4  PROT 8.1 6.7 6.1*  ALBUMIN 3.8 3.1* 2.8*   Recent Labs  Lab 03/29/24 1803  LIPASE 21   No results for input(s): "AMMONIA" in the last 168 hours. CBC: Recent Labs  Lab 03/29/24 1803 03/30/24 0415 03/31/24 0448  WBC 12.5* 13.0* 21.6*  HGB 16.3* 14.9 11.7*  HCT 51.0* 47.6* 37.6  MCV 94.4 96.6 96.4  PLT 187 158 214   Cardiac Enzymes: No results for input(s): "CKTOTAL", "CKMB", "CKMBINDEX", "TROPONINI" in the last 168 hours. BNP: Invalid input(s): "POCBNP" CBG: No results for input(s): "GLUCAP" in the last 168 hours. D-Dimer No results for input(s): "DDIMER" in the last 72 hours. Hgb A1c No results for input(s): "HGBA1C" in the last 72 hours. Lipid Profile No results for input(s): "CHOL", "HDL", "LDLCALC", "TRIG", "CHOLHDL", "LDLDIRECT" in the last 72 hours. Thyroid  function studies No results for input(s): "TSH", "T4TOTAL", "T3FREE", "THYROIDAB" in the last 72 hours.  Invalid input(s): "FREET3" Anemia work up No results for input(s): "VITAMINB12", "FOLATE", "FERRITIN", "TIBC", "IRON ", "RETICCTPCT" in the last 72 hours. Urinalysis    Component Value Date/Time   COLORURINE YELLOW 03/29/2024 2035   APPEARANCEUR CLEAR 03/29/2024 2035   APPEARANCEUR Clear 01/27/2024 1627   LABSPEC >1.046 (H) 03/29/2024 2035   PHURINE 8.0 03/29/2024 2035   GLUCOSEU NEGATIVE 03/29/2024 2035   HGBUR NEGATIVE 03/29/2024 2035   BILIRUBINUR NEGATIVE 03/29/2024 2035   BILIRUBINUR Negative 01/27/2024 1627   KETONESUR 5 (A) 03/29/2024 2035   PROTEINUR NEGATIVE 03/29/2024 2035   NITRITE NEGATIVE 03/29/2024 2035   LEUKOCYTESUR NEGATIVE 03/29/2024 2035   Sepsis Labs Recent  Labs  Lab 03/29/24 1803 03/30/24 0415 03/31/24 0448  WBC 12.5* 13.0* 21.6*   Microbiology No results found for this or any previous visit (from the past 240 hours).   Time coordinating discharge: 35 minutes  SIGNED:   Cornelius Dill, DO [facility] 03/31/2024, 6:23 PM  If 7PM-7AM, please contact night-coverage [email or web address]

## 2024-03-31 NOTE — Progress Notes (Signed)
 PROGRESS NOTE    [name]  ZOX:[id number] DOB: 1956/11/29 DOA: 03/29/2024 PCP: [facility]   Brief Narrative:     Norma Anthony is a 68 y.o. 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  She has noted to have acute diverticulitis and has been started on Rocephin  and Flagyl .  Assessment & Plan:   Principal Problem:   Acute diverticulitis Active Problems:   Chronic respiratory failure with hypoxia (HCC)   COPD (chronic obstructive pulmonary disease) (HCC)   History of pulmonary embolism   Gastroesophageal reflux disease without esophagitis   Generalized anxiety disorder   Abdominal pain   Vomiting   Acute diarrhea   Hypokalemia   Prolonged QT interval  Assessment and Plan:   Abdominal pain and vomiting due to acute diverticulitis Continue IV hydration and add bolus today Patient was treated with IV ceftriaxone  and metronidazole , we shall continue same at this time  Continue IV Dilaudid  0.5 mg every 3 hours as needed for moderate to severe pain Continue IV Compazine  p.r.n. for nausea or vomiting Continue clear liquid diet with plan to advanced diet as tolerated Monitor CBC with worsening leukocytosis noted   Acute diarrhea-resolved Patient has not had any diarrhea since arrival to the ED Consider GI stool panel/C. 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  Consultants:  None  Procedures:  None  Antimicrobials:  Anti-infectives (From admission, onward)    Start     Dose/Rate Route Frequency Ordered Stop   03/30/24 2200  cefTRIAXone  (ROCEPHIN ) 2 g in sodium chloride  0.9 % 100 mL IVPB       Placed in "And" Linked Group   2 g 200 mL/hr over 30 Minutes Intravenous Every 24 hours 03/30/24 0634     03/30/24 1000  metroNIDAZOLE  (FLAGYL ) IVPB 500 mg       Placed in "And" Linked Group   500 mg 100 mL/hr over 60 Minutes Intravenous Every 12 hours 03/30/24 0634     03/29/24 2230  cefTRIAXone  (ROCEPHIN ) 2 g in sodium chloride  0.9 % 100 mL IVPB       Placed in "And" Linked Group   2 g 200 mL/hr over 30 Minutes Intravenous  Once 03/29/24 2224 03/29/24 2321   03/29/24 2230  metroNIDAZOLE  (FLAGYL ) IVPB 500 mg       Placed in "And" Linked Group   500 mg 100 mL/hr over 60 Minutes Intravenous  Once 03/29/24 2224 03/30/24 0108       Subjective: Patient seen and evaluated today with complaints of abdominal pain that is ongoing. She denies any N/V/D. No acute concerns or events noted overnight.  Objective: Vitals:   03/30/24 2039 03/30/24 2206 03/30/24 2338 03/31/24  0344  BP: 100/81 106/74 102/63 97/75  Pulse: (!) 132 (!) 120 (!) 114   Resp: 15 (!) 22 20 18   Temp: (!) 97.5 F (36.4 C) 97.6 F (36.4 C) 98.3 F (36.8 C) (!) 97.4 F (36.3 C)  TempSrc: Oral Oral Oral Oral  SpO2: 96% 94% 91% 92%  Weight:      Height:        Intake/Output Summary (Last 24 hours) at 03/31/2024 1134 Last data filed at 03/31/2024 0900 Gross per 24 hour  Intake 800 ml  Output 500 ml  Net 300 ml   Filed Weights   03/29/24 1619 03/29/24 2354  Weight: 117.9  kg 107.3 kg    Examination:  General exam: Appears calm and comfortable  Respiratory system: Clear to auscultation. Respiratory effort normal. Cardiovascular system: S1 & S2 heard, RRR.  Gastrointestinal system: Abdomen is soft Central nervous system: Alert and awake Extremities: No edema Skin: No significant lesions noted Psychiatry: Flat affect.    Data Reviewed: I have personally reviewed following labs and imaging studies  CBC: Recent Labs  Lab 03/29/24 1803 03/30/24 0415 03/31/24 0448  WBC 12.5* 13.0* 21.6*  HGB 16.3* 14.9 11.7*  HCT 51.0* 47.6* 37.6  MCV 94.4 96.6 96.4  PLT 187 158 214   Basic Metabolic Panel: Recent Labs  Lab 03/29/24 1803 03/30/24 0415 03/31/24 0448  NA 140 139 135  K 3.2* 3.4* 3.7  CL 93* 98 98  CO2 30 30 26   GLUCOSE 119* 103* 125*  BUN 11 9 15   CREATININE 0.57 0.60 0.96  CALCIUM 9.7 8.6* 8.2*  MG  --  1.8 1.8  PHOS  --  2.5  --    GFR: Estimated Creatinine Clearance: 69.2 mL/min (by C-G formula based on SCr of 0.96 mg/dL). Liver Function Tests: Recent Labs  Lab 03/29/24 1803 03/30/24 0415 03/31/24 0448  AST 25 21 35  ALT 20 17 27   ALKPHOS 99 81 67  BILITOT 0.8 0.7 0.4  PROT 8.1 6.7 6.1*  ALBUMIN 3.8 3.1* 2.8*   Recent Labs  Lab 03/29/24 1803  LIPASE 21   No results for input(s): "AMMONIA" in the last 168 hours. Coagulation Profile: No results for input(s): "INR", "PROTIME" in the last 168 hours. Cardiac Enzymes: No results for input(s): "CKTOTAL", "CKMB", "CKMBINDEX", "TROPONINI" in the last 168 hours. BNP (last 3 results) No results for input(s): "PROBNP" in the last 8760 hours. HbA1C: No results for input(s): "HGBA1C" in the last 72 hours. CBG: No results for input(s): "GLUCAP" in the last 168 hours. Lipid Profile: No results for input(s): "CHOL", "HDL", "LDLCALC", "TRIG", "CHOLHDL", "LDLDIRECT" in the last 72 hours. Thyroid  Function Tests: No results for input(s): "TSH", "T4TOTAL", "FREET4", "T3FREE",  "THYROIDAB" in the last 72 hours. Anemia Panel: No results for input(s): "VITAMINB12", "FOLATE", "FERRITIN", "TIBC", "IRON ", "RETICCTPCT" in the last 72 hours. Sepsis Labs: Recent Labs  Lab 03/31/24 0820  LATICACIDVEN 1.1    No results found for this or any previous visit (from the past 240 hours).       Radiology Studies: CT ABDOMEN PELVIS W CONTRAST Result Date: 03/29/2024 CLINICAL DATA:  Abdominal pain, acute, nonlocalized, with nausea, vomiting and diarrhea for 3 days. EXAM: CT ABDOMEN AND PELVIS WITH CONTRAST TECHNIQUE: Multidetector CT imaging of the abdomen and pelvis was performed using the standard protocol following bolus administration of intravenous contrast. RADIATION DOSE REDUCTION: This exam was performed according to the departmental dose-optimization program which includes automated exposure control, adjustment of the mA and/or kV according  to patient size and/or use of iterative reconstruction technique. CONTRAST:  OMNIPAQUE  IOHEXOL  300 MG/ML  SOLN COMPARISON:  CTs without contrast 01/27/2024 and 11/16/2023. FINDINGS: Lower chest: There are old postsurgical changes at the esophageal hiatus with a moderate-sized to large esophageal hiatal hernia with about half of the stomach intrathoracic. Lung bases remain clear.  The cardiac size is normal. Hepatobiliary: No focal liver abnormality is seen. The liver is mildly steatotic diffusely. No calcified gallstones, gallbladder wall thickening, or biliary dilatation. Pancreas: There is fatty infiltration partial glandular atrophy. No mass enhancement. No inflammatory change. No ductal dilatation. Spleen: Normal. Adrenals/Urinary Tract: There is no adrenal mass. There are occasional bilateral subcentimeter cortical too small to characterize Bosniak 2 renal cysts. No follow-up imaging is recommended. No renal mass enhancement is seen. A 2 mm nonobstructing caliceal stone again is noted in the inferior pole of the right kidney. No  nephrolithiasis is seen on the left. No ureteral stones or hydronephrosis bilaterally. The kidneys excrete symmetrically on the delayed images. The bladder is unremarkable for the degree of distention. Stomach/Bowel: There is a partially visible hiatal hernia, inverted partially intrathoracic stomach. There are chronic thickened folds in herniated stomach. There is jejunal fold thickening consistent with nonspecific enteritis or enteropathy. No bowel dilatation is seen.  The appendix is normal. The wall of the colon is normal thickness. There is scattered diverticulosis. A mixture of hazy and linear stranding extends anterior and inferior to the anterior wall of the mid transverse colon and could be due to a focal diverticulitis or other unrelated inflammatory process, or could be due to trauma with an evolving omental infarct. Also, the linear densities associated with the stranding could be due to attempted fistula formation but do not connect with any of the underlying bowel. This was not seen on the 2 prior studies, including most recently 2 months ago. There is no free air in the area or elsewhere, and no localizing fluid collection. This is best seen on series 2 axial images 34-44. No other focal reactive or inflammatory process is seen. Vascular/Lymphatic: Stable infrarenal IVC filter. Minimal aortic to left iliac atherosclerosis. No AAA. No adenopathy. Reproductive: Status post hysterectomy. No adnexal masses. Other: Upper abdominal ventral hernia repair again noted without evidence of hernia recurrence. No free fluid, free hemorrhage or free air. Musculoskeletal: Osteopenia, multilevel degenerative discs and spondylosis lumbar spine with mild chronic wedging of the T12 and L1 vertebral bodies. No acute or other significant osseous findings. IMPRESSION: 1. A mixture of hazy and linear stranding extends anterior and inferior to the anterior wall of the mid transverse colon, and could be due to a focal  diverticulitis or other unrelated inflammatory process, or could be due to trauma with an evolving omental infarct. 2. Also, The linear densities associated with the stranding could be due to attempted fistula formation but do not connect with any of the underlying bowel. This was not seen on the 2 prior studies, including most recently 2 months ago. There is no free air or localizing fluid collection. 3. Jejunal fold thickening consistent with nonspecific enteritis or enteropathy. 4. Moderate-sized to large esophageal hiatal hernia with about half of the stomach intrathoracic. Chronic thickened folds in the herniated stomach. 5. Mild hepatic steatosis. 6. 2 mm nonobstructing right renal calyceal stone. 7. Aortic atherosclerosis. 8. Osteopenia and degenerative change. 9. Stable infrarenal IVC filter. Electronically Signed   By: Denman Fischer M.D.   On: 03/29/2024 21:32   DG Chest Portable 1 View Result  Date: 03/29/2024 CLINICAL DATA:  chest pain EXAM: PORTABLE CHEST 1 VIEW COMPARISON:  Chest x-ray 11/16/2023 FINDINGS: The heart and mediastinal contours are unchanged. Patient is rotated. No focal consolidation. No pulmonary edema. No pleural effusion. No pneumothorax. No acute osseous abnormality. IMPRESSION: No active disease. Electronically Signed   By: Morgane  Naveau M.D.   On: 03/29/2024 20:14        Scheduled Meds:  famotidine   20 mg Oral QHS   nystatin    Topical TID   pantoprazole   40 mg Oral Q breakfast   rivaroxaban   20 mg Oral QHS   Continuous Infusions:  sodium chloride      cefTRIAXone  (ROCEPHIN )  IV 2 g (03/30/24 2139)   And   metronidazole  500 mg (03/31/24 1018)   sodium chloride        LOS: 2 days    Time spent: 55 minutes    Samanatha Brammer D Mason Sole, DO [facility]  If 7PM-7AM, please contact night-coverage [email or web address] 03/31/2024, 11:34 AM

## 2024-04-01 DIAGNOSIS — K5792 Diverticulitis of intestine, part unspecified, without perforation or abscess without bleeding: Secondary | ICD-10-CM | POA: Diagnosis not present

## 2024-04-01 LAB — CBC
HCT: 28.5 % — ABNORMAL LOW (ref 36.0–46.0)
Hemoglobin: 8.9 g/dL — ABNORMAL LOW (ref 12.0–15.0)
MCH: 30 pg (ref 26.0–34.0)
MCHC: 31.2 g/dL (ref 30.0–36.0)
MCV: 96 fL (ref 80.0–100.0)
Platelets: 174 10*3/uL (ref 150–400)
RBC: 2.97 MIL/uL — ABNORMAL LOW (ref 3.87–5.11)
RDW: 14.2 % (ref 11.5–15.5)
WBC: 17.8 10*3/uL — ABNORMAL HIGH (ref 4.0–10.5)
nRBC: 0 % (ref 0.0–0.2)

## 2024-04-01 LAB — COMPREHENSIVE METABOLIC PANEL WITH GFR
ALT: 30 U/L (ref 0–44)
AST: 29 U/L (ref 15–41)
Albumin: 2.5 g/dL — ABNORMAL LOW (ref 3.5–5.0)
Alkaline Phosphatase: 59 U/L (ref 38–126)
Anion gap: 10 (ref 5–15)
BUN: 10 mg/dL (ref 8–23)
CO2: 27 mmol/L (ref 22–32)
Calcium: 8.1 mg/dL — ABNORMAL LOW (ref 8.9–10.3)
Chloride: 101 mmol/L (ref 98–111)
Creatinine, Ser: 0.63 mg/dL (ref 0.44–1.00)
GFR, Estimated: >60 mL/min (ref >60)
Glucose, Bld: 135 mg/dL — ABNORMAL HIGH (ref 70–99)
Potassium: 3.4 mmol/L — ABNORMAL LOW (ref 3.5–5.1)
Sodium: 138 mmol/L (ref 135–145)
Total Bilirubin: 0.6 mg/dL (ref 0.0–1.2)
Total Protein: 5.4 g/dL — ABNORMAL LOW (ref 6.5–8.1)

## 2024-04-01 LAB — MAGNESIUM: Magnesium: 1.7 mg/dL (ref 1.7–2.4)

## 2024-04-01 MED ORDER — POTASSIUM CHLORIDE CRYS ER 20 MEQ PO TBCR
40.0000 meq | EXTENDED_RELEASE_TABLET | Freq: Two times a day (BID) | ORAL | Status: AC
  Administered 2024-04-01 (×2): 40 meq via ORAL
  Filled 2024-04-01 (×2): qty 2

## 2024-04-01 NOTE — Plan of Care (Signed)
  Problem: Education: Goal: Knowledge of General Education information will improve Description: Including pain rating scale, medication(s)/side effects and non-pharmacologic comfort measures Outcome: Progressing   Problem: Pain Managment: Goal: General experience of comfort will improve and/or be controlled Outcome: Progressing

## 2024-04-01 NOTE — Plan of Care (Signed)

## 2024-04-01 NOTE — Progress Notes (Addendum)
 PROGRESS NOTE    Norma Anthony  ZOX:[id number] DOB: June 12, 1956 DOA: 03/29/2024 PCP: [facility]   Brief Narrative:     Norma Anthony is a 68 y.o. 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  She has noted to have acute diverticulitis and has been started on Rocephin  and Flagyl .  She is noted to have decline in hemoglobin levels with bruising to the left chest wall and therefore Xarelto  has been discontinued on 4/19.  Assessment & Plan:   Principal Problem:   Acute diverticulitis Active Problems:   Chronic respiratory failure with hypoxia (HCC)   COPD (chronic obstructive pulmonary disease) (HCC)   History of pulmonary embolism   Gastroesophageal reflux disease without esophagitis   Generalized anxiety disorder   Abdominal pain   Vomiting   Acute diarrhea   Hypokalemia   Prolonged QT interval  Assessment and Plan:   Abdominal pain and vomiting due to acute diverticulitis Continue IV hydration and add bolus today Patient was treated with IV ceftriaxone  and metronidazole , we shall continue same at this time  Continue IV Dilaudid  0.5 mg every 3 hours as needed for moderate to severe pain Continue IV Compazine  p.r.n. for nausea or vomiting Advance to soft diet Monitor CBC with worsening leukocytosis noted   Acute diarrhea-resolved Patient has not had any diarrhea since arrival to the ED   Chronic respiratory failure with hypoxia Continue supplemental oxygen via [place] [facility] at 2 LPM per home regimen   COPD (not in exacerbation) Continue albuterol  per home regimen   History of pulmonary embolus (PE)/DVT Appears to have 1 episode of this noted several years ago IVC  filter placed previously Discontinue Xarelto  and given recurrent falls at home, this should be discontinued indefinitely   GERD without esophagitis Continue home regimen of pantoprazole  and famotidine    Generalized anxiety disorder Continue Xanax  per home regimen  Obesity class II -BMI 39.36    DVT prophylaxis: SCDs Code Status: Full Family Communication: Husband at bedside 4/19 Disposition Plan:  Status is: Inpatient Remains inpatient appropriate because: Need for IV medications.  Consultants:  None  Procedures:  None  Antimicrobials:  Anti-infectives (From admission, onward)    Start     Dose/Rate Route Frequency Ordered Stop   03/30/24 2200  cefTRIAXone  (ROCEPHIN ) 2 g in sodium chloride  0.9 % 100 mL IVPB       Placed in "And" Linked Group   2 g 200 mL/hr over 30 Minutes Intravenous Every 24 hours 03/30/24 0634     03/30/24 1000  metroNIDAZOLE  (FLAGYL ) IVPB 500 mg       Placed in "And" Linked Group   500 mg 100 mL/hr over 60 Minutes Intravenous Every 12 hours 03/30/24 0634     03/29/24 2230  cefTRIAXone  (ROCEPHIN ) 2 g in sodium chloride  0.9 % 100 mL IVPB       Placed in "And" Linked Group   2 g 200 mL/hr over 30 Minutes Intravenous  Once 03/29/24 2224 03/29/24 2321   03/29/24 2230  metroNIDAZOLE  (FLAGYL ) IVPB 500 mg       Placed in "And" Linked Group   500 mg 100 mL/hr over 60 Minutes Intravenous  Once 03/29/24 2224 03/30/24 0108       Subjective: Patient seen  and evaluated today and continues to remain intermittently somnolent, but this is apparently baseline per the husband as he states that she is on narcotic medications at home for her back pain.  She complains of some minimal back pain, but did have some macaroni and cheese last night.  Noted to have decrease in hemoglobin levels with bruising to left chest wall.  Objective: Vitals:   03/31/24 0344 03/31/24 1416 03/31/24 1942 04/01/24 0449  BP: 97/75 117/76 (!) 103/50 (!) 140/61  Pulse:  (!) 101 (!) 104  (!) 102  Resp: 18 18    Temp: (!) 97.4 F (36.3 C) 98.4 F (36.9 C) 98.5 F (36.9 C) 99.1 F (37.3 C)  TempSrc: Oral  Oral Oral  SpO2: 92% 92% 92% 93%  Weight:      Height:        Intake/Output Summary (Last 24 hours) at 04/01/2024 1000 Last data filed at 04/01/2024 0940 Gross per 24 hour  Intake 1639.8 ml  Output 800 ml  Net 839.8 ml   Filed Weights   03/29/24 1619 03/29/24 2354  Weight: 117.9 kg 107.3 kg    Examination:  General exam: Intermittently somnolent, but responsive to questioning, obese Respiratory system: Clear to auscultation. Respiratory effort normal. Cardiovascular system: S1 & S2 heard, RRR.  Gastrointestinal system: Abdomen is soft Extremities: No edema Skin: Significant bruising to left chest wall noted Psychiatry: Flat affect.    Data Reviewed: I have personally reviewed following labs and imaging studies  CBC: Recent Labs  Lab 03/29/24 1803 03/30/24 0415 03/31/24 0448 04/01/24 0503  WBC 12.5* 13.0* 21.6* 17.8*  HGB 16.3* 14.9 11.7* 8.9*  HCT 51.0* 47.6* 37.6 28.5*  MCV 94.4 96.6 96.4 96.0  PLT 187 158 214 174   Basic Metabolic Panel: Recent Labs  Lab 03/29/24 1803 03/30/24 0415 03/31/24 0448 04/01/24 0503  NA 140 139 135 138  K 3.2* 3.4* 3.7 3.4*  CL 93* 98 98 101  CO2 30 30 26 27   GLUCOSE 119* 103* 125* 135*  BUN 11 9 15 10   CREATININE 0.57 0.60 0.96 0.63  CALCIUM 9.7 8.6* 8.2* 8.1*  MG  --  1.8 1.8 1.7  PHOS  --  2.5  --   --    GFR: Estimated Creatinine Clearance: 83.1 mL/min (by C-G formula based on SCr of 0.63 mg/dL). Liver Function Tests: Recent Labs  Lab 03/29/24 1803 03/30/24 0415 03/31/24 0448 04/01/24 0503  AST 25 21 35 29  ALT 20 17 27 30   ALKPHOS 99 81 67 59  BILITOT 0.8 0.7 0.4 0.6  PROT 8.1 6.7 6.1* 5.4*  ALBUMIN 3.8 3.1* 2.8* 2.5*   Recent Labs  Lab 03/29/24 1803  LIPASE 21   No results for input(s): "AMMONIA" in the last 168 hours. Coagulation Profile: No results for input(s): "INR",  "PROTIME" in the last 168 hours. Cardiac Enzymes: No results for input(s): "CKTOTAL", "CKMB", "CKMBINDEX", "TROPONINI" in the last 168 hours. BNP (last 3 results) No results for input(s): "PROBNP" in the last 8760 hours. HbA1C: No results for input(s): "HGBA1C" in the last 72 hours. CBG: No results for input(s): "GLUCAP" in the last 168 hours. Lipid Profile: No results for input(s): "CHOL", "HDL", "LDLCALC", "TRIG", "CHOLHDL", "LDLDIRECT" in the last 72 hours. Thyroid  Function Tests: No results for input(s): "TSH", "T4TOTAL", "FREET4", "T3FREE", "THYROIDAB" in the last 72 hours. Anemia Panel: No results for input(s): "VITAMINB12", "FOLATE", "FERRITIN", "TIBC", "IRON ", "RETICCTPCT" in the last 72 hours. Sepsis Labs: Recent Labs  Lab 03/31/24 0820  LATICACIDVEN 1.1    No results found for this or any previous visit (from the past 240 hours).    Radiology Studies: No results found.    Scheduled Meds:  famotidine   20 mg Oral QHS   nystatin    Topical TID   pantoprazole   40 mg Oral Q breakfast   potassium chloride   40 mEq Oral BID   Continuous Infusions:  cefTRIAXone  (ROCEPHIN )  IV Stopped (03/31/24 2259)   And   metronidazole  Stopped (03/31/24 2211)     LOS: 3 days    Time spent: 55 minutes    Jackquline Branca D Mason Sole, DO [facility]  If 7PM-7AM, please contact night-coverage [email or web address] 04/01/2024, 10:00 AM

## 2024-04-02 DIAGNOSIS — K5792 Diverticulitis of intestine, part unspecified, without perforation or abscess without bleeding: Secondary | ICD-10-CM | POA: Diagnosis not present

## 2024-04-02 LAB — CBC
HCT: 23.9 % — ABNORMAL LOW (ref 36.0–46.0)
Hemoglobin: 7.6 g/dL — ABNORMAL LOW (ref 12.0–15.0)
MCH: 30.9 pg (ref 26.0–34.0)
MCHC: 31.8 g/dL (ref 30.0–36.0)
MCV: 97.2 fL (ref 80.0–100.0)
Platelets: 157 10*3/uL (ref 150–400)
RBC: 2.46 MIL/uL — ABNORMAL LOW (ref 3.87–5.11)
RDW: 14.5 % (ref 11.5–15.5)
WBC: 15.9 10*3/uL — ABNORMAL HIGH (ref 4.0–10.5)
nRBC: 0.2 % (ref 0.0–0.2)

## 2024-04-02 LAB — IRON AND TIBC
Iron: 26 ug/dL — ABNORMAL LOW (ref 28–170)
Saturation Ratios: 13 % (ref 10.4–31.8)
TIBC: 208 ug/dL — ABNORMAL LOW (ref 250–450)
UIBC: 182 ug/dL

## 2024-04-02 LAB — BASIC METABOLIC PANEL WITH GFR
Anion gap: 6 (ref 5–15)
BUN: 6 mg/dL — ABNORMAL LOW (ref 8–23)
CO2: 27 mmol/L (ref 22–32)
Calcium: 7.9 mg/dL — ABNORMAL LOW (ref 8.9–10.3)
Chloride: 103 mmol/L (ref 98–111)
Creatinine, Ser: 0.49 mg/dL (ref 0.44–1.00)
GFR, Estimated: >60 mL/min (ref >60)
Glucose, Bld: 94 mg/dL (ref 70–99)
Potassium: 3.8 mmol/L (ref 3.5–5.1)
Sodium: 136 mmol/L (ref 135–145)

## 2024-04-02 LAB — VITAMIN B12: Vitamin B-12: 506 pg/mL (ref 180–914)

## 2024-04-02 LAB — RETICULOCYTES
Immature Retic Fract: 45.4 % — ABNORMAL HIGH (ref 2.3–15.9)
RBC.: 2.48 MIL/uL — ABNORMAL LOW (ref 3.87–5.11)
Retic Count, Absolute: 73.2 10*3/uL (ref 19.0–186.0)
Retic Ct Pct: 3 % (ref 0.4–3.1)

## 2024-04-02 LAB — FOLATE: Folate: 16.1 ng/mL (ref >5.9)

## 2024-04-02 LAB — HEMOGLOBIN AND HEMATOCRIT, BLOOD
HCT: 24.1 % — ABNORMAL LOW (ref 36.0–46.0)
Hemoglobin: 7.6 g/dL — ABNORMAL LOW (ref 12.0–15.0)

## 2024-04-02 LAB — MAGNESIUM: Magnesium: 1.7 mg/dL (ref 1.7–2.4)

## 2024-04-02 LAB — FERRITIN: Ferritin: 82 ng/mL (ref 11–307)

## 2024-04-02 MED ORDER — CEFDINIR 300 MG PO CAPS: 300.0000 mg | ORAL_CAPSULE | Freq: Two times a day (BID) | ORAL | 0 refills | Status: AC

## 2024-04-02 MED ORDER — METRONIDAZOLE 500 MG PO TABS: 500.0000 mg | ORAL_TABLET | Freq: Two times a day (BID) | ORAL | 0 refills | Status: AC

## 2024-04-02 MED ORDER — CEFDINIR 300 MG PO CAPS: 300.0000 mg | ORAL_CAPSULE | Freq: Two times a day (BID) | ORAL | 0 refills | Status: DC

## 2024-04-02 MED ORDER — SODIUM CHLORIDE 0.9 % IV SOLN
100.0000 mg | Freq: Once | INTRAVENOUS | Status: AC
  Administered 2024-04-02: 100 mg via INTRAVENOUS
  Filled 2024-04-02: qty 5

## 2024-04-02 MED ORDER — METRONIDAZOLE 500 MG PO TABS: 500.0000 mg | ORAL_TABLET | Freq: Two times a day (BID) | ORAL | 0 refills | Status: DC

## 2024-04-02 NOTE — Discharge Summary (Signed)
 Physician Discharge Summary  Norma Anthony ZOX:[id number] DOB: 12/24/1955 DOA: 03/29/2024  PCP: [facility]  Admit date: 03/29/2024  Discharge date: 04/02/2024  Admitted From:Home  Disposition:  Home  Recommendations for Outpatient Follow-up:  Follow up with PCP in 1-2 weeks Follow-up CBC levels in the next 5-7 days to ensure stability given formation of hematoma to left breast and chest wall Discontinue further use of Xarelto  as patient has had 1 episode of PE/DVT in 2022 and does not require this any further given issues with frequent falls and now development of hematoma Remain on antibiotics as prescribed to complete 10-day course Continue other home medications as prior  Home Health: None  Equipment/Devices: Wheelchair bound at home  Discharge Condition:Stable  CODE STATUS: Full  Diet recommendation: Heart Healthy  Brief/Interim Summary:  Norma Anthony is a 68 y.o. 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  She has noted to have acute diverticulitis and has been started on Rocephin  and Flagyl .  She is noted to have decline in hemoglobin levels with bruising to the left chest wall and therefore Xarelto  has been discontinued on 4/19.  Her overall condition has improved and she is now tolerating diet without any significant bowel movements or abdominal pain noted.  Plan will be to continue oral antibiotics for several more days to complete course of treatment.  She no longer requires Xarelto  and has stable hemoglobin levels and will require close follow-up with her PCP.  Discharge Diagnoses:  Principal Problem:   Acute diverticulitis Active Problems:   Chronic  respiratory failure with hypoxia (HCC)   COPD (chronic obstructive pulmonary disease) (HCC)   History of pulmonary embolism   Gastroesophageal reflux disease without esophagitis   Generalized anxiety disorder   Abdominal pain   Vomiting   Acute diarrhea   Hypokalemia   Prolonged QT interval  Principal discharge diagnosis: Abdominal pain and vomiting secondary to acute diverticulitis.  Development of left breast/chest wall hematoma with use of Xarelto .  Discharge Instructions  Discharge Instructions     Diet - low sodium heart healthy   Complete by: As directed    Increase activity slowly   Complete by: As directed    No wound care   Complete by: As directed       Allergies as of 04/02/2024       Reactions   Belbuca  [buprenorphine  Hcl] Other (See Comments)   " Horrible headache"    Pregabalin Other (See Comments)   Paralysis present (finding)- not tolerated Pt stated she could not move her body but she could think   Compazine  [prochlorperazine ] Anxiety   Droperidol Anxiety   Ergotamine-caffeine Anxiety, Hypertension   Increased systolic arterial pressure (finding)        Medication List     STOP taking these medications    Xarelto  20 MG Tabs tablet Generic drug: rivaroxaban        TAKE these medications    albuterol  108 (90 Base) MCG/ACT inhaler Commonly known as: VENTOLIN  HFA Inhale 2 puffs into the lungs every 6 (six) hours as needed for wheezing or shortness of breath.   ALPRAZolam  1 MG tablet Commonly known as: XANAX  Take 1 tablet (1 mg total) by mouth every 6 (six) hours as needed  for anxiety or sleep.   ascorbic acid  1000 MG tablet Commonly known as: VITAMIN C  Take 1,000 mg by mouth daily.   b complex vitamins capsule Take 1 capsule by mouth daily.   cefdinir  300 MG capsule Commonly known as: OMNICEF  Take 1 capsule (300 mg total) by mouth 2 (two) times daily for 5 days.   cholecalciferol 25 MCG (1000 UNIT) tablet Commonly known as: VITAMIN  D3 Take 5,000 Units by mouth daily.   clotrimazole -betamethasone  cream Commonly known as: LOTRISONE  Apply 1 Application topically 2 (two) times daily as needed (for irritation under the breasts or abdominal folds).   DULoxetine  60 MG capsule Commonly known as: CYMBALTA  Take 60 mg by mouth at bedtime.   famotidine  20 MG tablet Commonly known as: PEPCID  TAKE 1 TABLET BY MOUTH AFTER SUPPER What changed:  how much to take how to take this when to take this additional instructions   Ferrous Fumarate -Folic Acid  324-1 MG Tabs Take 1 tablet by mouth daily. Hematinic   fluconazole  200 MG tablet Commonly known as: DIFLUCAN  Take 200 mg by mouth daily.   gabapentin  400 MG capsule Commonly known as: NEURONTIN  Take 400 mg by mouth 2 (two) times daily.   metroNIDAZOLE  500 MG tablet Commonly known as: Flagyl  Take 1 tablet (500 mg total) by mouth 2 (two) times daily for 5 days.   morphine  15 MG tablet Commonly known as: MSIR Take 15 mg by mouth 3 (three) times daily as needed for moderate pain (pain score 4-6).   naloxone  4 MG/0.1ML Liqd nasal spray kit Commonly known as: NARCAN  Place 1 spray into the nose once as needed (accidental over dose).   nutrition supplement (JUVEN) Pack Take 1 packet by mouth 2 (two) times daily between meals.   ondansetron  4 MG tablet Commonly known as: Zofran  Take 1 tablet (4 mg total) by mouth every 8 (eight) hours as needed for nausea or vomiting.   pantoprazole  40 MG tablet Commonly known as: PROTONIX  Take 40 mg by mouth daily with breakfast.   promethazine  25 MG tablet Commonly known as: PHENERGAN  Take 25 mg by mouth every 6 (six) hours as needed for nausea or vomiting.   tiZANidine  2 MG tablet Commonly known as: ZANAFLEX  Take 2 mg by mouth every 12 (twelve) hours as needed for muscle spasms.   zinc  gluconate 50 MG tablet Take 50 mg by mouth daily.        Follow-up Information     [facility], [facility]. Go to.   Contact  information: [address] [place] [address] [phone]                Allergies  Allergen Reactions   Belbuca  [Buprenorphine  Hcl] Other (See Comments)    " Horrible headache"    Pregabalin Other (See Comments)    Paralysis present (finding)- not tolerated  Pt stated she could not move her body but she could think   Compazine  [Prochlorperazine ] Anxiety   Droperidol Anxiety   Ergotamine-Caffeine Anxiety and Hypertension    Increased systolic arterial pressure (finding)     Consultations: None   Procedures/Studies: CT ABDOMEN PELVIS W CONTRAST Result Date: 03/29/2024 CLINICAL DATA:  Abdominal pain, acute, nonlocalized, with nausea, vomiting and diarrhea for 3 days. EXAM: CT ABDOMEN AND PELVIS WITH CONTRAST TECHNIQUE: Multidetector CT imaging of the abdomen and pelvis was performed using the standard protocol following bolus administration of intravenous contrast. RADIATION DOSE REDUCTION: This exam was performed according to the departmental dose-optimization program which includes automated exposure control,  adjustment of the mA and/or kV according to patient size and/or use of iterative reconstruction technique. CONTRAST:  OMNIPAQUE  IOHEXOL  300 MG/ML  SOLN COMPARISON:  CTs without contrast 01/27/2024 and 11/16/2023. FINDINGS: Lower chest: There are old postsurgical changes at the esophageal hiatus with a moderate-sized to large esophageal hiatal hernia with about half of the stomach intrathoracic. Lung bases remain clear.  The cardiac size is normal. Hepatobiliary: No focal liver abnormality is seen. The liver is mildly steatotic diffusely. No calcified gallstones, gallbladder wall thickening, or biliary dilatation. Pancreas: There is fatty infiltration partial glandular atrophy. No mass enhancement. No inflammatory change. No ductal dilatation. Spleen: Normal. Adrenals/Urinary Tract: There is no adrenal mass. There are occasional bilateral subcentimeter cortical too  small to characterize Bosniak 2 renal cysts. No follow-up imaging is recommended. No renal mass enhancement is seen. A 2 mm nonobstructing caliceal stone again is noted in the inferior pole of the right kidney. No nephrolithiasis is seen on the left. No ureteral stones or hydronephrosis bilaterally. The kidneys excrete symmetrically on the delayed images. The bladder is unremarkable for the degree of distention. Stomach/Bowel: There is a partially visible hiatal hernia, inverted partially intrathoracic stomach. There are chronic thickened folds in herniated stomach. There is jejunal fold thickening consistent with nonspecific enteritis or enteropathy. No bowel dilatation is seen.  The appendix is normal. The wall of the colon is normal thickness. There is scattered diverticulosis. A mixture of hazy and linear stranding extends anterior and inferior to the anterior wall of the mid transverse colon and could be due to a focal diverticulitis or other unrelated inflammatory process, or could be due to trauma with an evolving omental infarct. Also, the linear densities associated with the stranding could be due to attempted fistula formation but do not connect with any of the underlying bowel. This was not seen on the 2 prior studies, including most recently 2 months ago. There is no free air in the area or elsewhere, and no localizing fluid collection. This is best seen on series 2 axial images 34-44. No other focal reactive or inflammatory process is seen. Vascular/Lymphatic: Stable infrarenal IVC filter. Minimal aortic to left iliac atherosclerosis. No AAA. No adenopathy. Reproductive: Status post hysterectomy. No adnexal masses. Other: Upper abdominal ventral hernia repair again noted without evidence of hernia recurrence. No free fluid, free hemorrhage or free air. Musculoskeletal: Osteopenia, multilevel degenerative discs and spondylosis lumbar spine with mild chronic wedging of the T12 and L1 vertebral bodies. No  acute or other significant osseous findings. IMPRESSION: 1. A mixture of hazy and linear stranding extends anterior and inferior to the anterior wall of the mid transverse colon, and could be due to a focal diverticulitis or other unrelated inflammatory process, or could be due to trauma with an evolving omental infarct. 2. Also, The linear densities associated with the stranding could be due to attempted fistula formation but do not connect with any of the underlying bowel. This was not seen on the 2 prior studies, including most recently 2 months ago. There is no free air or localizing fluid collection. 3. Jejunal fold thickening consistent with nonspecific enteritis or enteropathy. 4. Moderate-sized to large esophageal hiatal hernia with about half of the stomach intrathoracic. Chronic thickened folds in the herniated stomach. 5. Mild hepatic steatosis. 6. 2 mm nonobstructing right renal calyceal stone. 7. Aortic atherosclerosis. 8. Osteopenia and degenerative change. 9. Stable infrarenal IVC filter. Electronically Signed   By: Denman Fischer M.D.   On: 03/29/2024 21:32  DG Chest Portable 1 View Result Date: 03/29/2024 CLINICAL DATA:  chest pain EXAM: PORTABLE CHEST 1 VIEW COMPARISON:  Chest x-ray 11/16/2023 FINDINGS: The heart and mediastinal contours are unchanged. Patient is rotated. No focal consolidation. No pulmonary edema. No pleural effusion. No pneumothorax. No acute osseous abnormality. IMPRESSION: No active disease. Electronically Signed   By: Morgane  Naveau M.D.   On: 03/29/2024 20:14     Discharge Exam: Vitals:   04/02/24 0610 04/02/24 1000  BP: (!) 126/55 135/60  Pulse: 99 (!) 102  Resp: 19   Temp: (!) 100.6 F (38.1 C) 98.7 F (37.1 C)  SpO2: 94% 100%   Vitals:   04/01/24 1427 04/01/24 2113 04/02/24 0610 04/02/24 1000  BP: 134/68 (!) 126/57 (!) 126/55 135/60  Pulse: 96 96 99 (!) 102  Resp:  20 19   Temp: 99.7 F (37.6 C) 98.2 F (36.8 C) (!) 100.6 F (38.1 C) 98.7 F  (37.1 C)  TempSrc: Oral Oral Oral Oral  SpO2: 91% 95% 94% 100%  Weight:      Height:        General: Pt is alert, awake, not in acute distress Cardiovascular: RRR, S1/S2 +, no rubs, no gallops Respiratory: CTA bilaterally, no wheezing, no rhonchi Abdominal: Soft, NT, ND, bowel sounds + Extremities: no edema, no cyanosis    The results of significant diagnostics from this hospitalization (including imaging, microbiology, ancillary and laboratory) are listed below for reference.     Microbiology: No results found for this or any previous visit (from the past 240 hours).   Labs: BNP (last 3 results) No results for input(s): "BNP" in the last 8760 hours. Basic Metabolic Panel: Recent Labs  Lab 03/29/24 1803 03/30/24 0415 03/31/24 0448 04/01/24 0503 04/02/24 0429  NA 140 139 135 138 136  K 3.2* 3.4* 3.7 3.4* 3.8  CL 93* 98 98 101 103  CO2 30 30 26 27 27   GLUCOSE 119* 103* 125* 135* 94  BUN 11 9 15 10  6*  CREATININE 0.57 0.60 0.96 0.63 0.49  CALCIUM 9.7 8.6* 8.2* 8.1* 7.9*  MG  --  1.8 1.8 1.7 1.7  PHOS  --  2.5  --   --   --    Liver Function Tests: Recent Labs  Lab 03/29/24 1803 03/30/24 0415 03/31/24 0448 04/01/24 0503  AST 25 21 35 29  ALT 20 17 27 30   ALKPHOS 99 81 67 59  BILITOT 0.8 0.7 0.4 0.6  PROT 8.1 6.7 6.1* 5.4*  ALBUMIN 3.8 3.1* 2.8* 2.5*   Recent Labs  Lab 03/29/24 1803  LIPASE 21   No results for input(s): "AMMONIA" in the last 168 hours. CBC: Recent Labs  Lab 03/29/24 1803 03/30/24 0415 03/31/24 0448 04/01/24 0503 04/02/24 0429 04/02/24 1038  WBC 12.5* 13.0* 21.6* 17.8* 15.9*  --   HGB 16.3* 14.9 11.7* 8.9* 7.6* 7.6*  HCT 51.0* 47.6* 37.6 28.5* 23.9* 24.1*  MCV 94.4 96.6 96.4 96.0 97.2  --   PLT 187 158 214 174 157  --    Cardiac Enzymes: No results for input(s): "CKTOTAL", "CKMB", "CKMBINDEX", "TROPONINI" in the last 168 hours. BNP: Invalid input(s): "POCBNP" CBG: No results for input(s): "GLUCAP" in the last 168  hours. D-Dimer No results for input(s): "DDIMER" in the last 72 hours. Hgb A1c No results for input(s): "HGBA1C" in the last 72 hours. Lipid Profile No results for input(s): "CHOL", "HDL", "LDLCALC", "TRIG", "CHOLHDL", "LDLDIRECT" in the last 72 hours. Thyroid  function studies No results for input(s): "  TSH", "T4TOTAL", "T3FREE", "THYROIDAB" in the last 72 hours.  Invalid input(s): "FREET3" Anemia work up Recent Labs    04/02/24 0429  VITAMINB12 506  FOLATE 16.1  FERRITIN 82  TIBC 208*  IRON  26*  RETICCTPCT 3.0   Urinalysis    Component Value Date/Time   COLORURINE YELLOW 03/29/2024 2035   APPEARANCEUR CLEAR 03/29/2024 2035   APPEARANCEUR Clear 01/27/2024 1627   LABSPEC >1.046 (H) 03/29/2024 2035   PHURINE 8.0 03/29/2024 2035   GLUCOSEU NEGATIVE 03/29/2024 2035   HGBUR NEGATIVE 03/29/2024 2035   BILIRUBINUR NEGATIVE 03/29/2024 2035   BILIRUBINUR Negative 01/27/2024 1627   KETONESUR 5 (A) 03/29/2024 2035   PROTEINUR NEGATIVE 03/29/2024 2035   NITRITE NEGATIVE 03/29/2024 2035   LEUKOCYTESUR NEGATIVE 03/29/2024 2035   Sepsis Labs Recent Labs  Lab 03/30/24 0415 03/31/24 0448 04/01/24 0503 04/02/24 0429  WBC 13.0* 21.6* 17.8* 15.9*   Microbiology No results found for this or any previous visit (from the past 240 hours).   Time coordinating discharge: 35 minutes  SIGNED:   Cornelius Dill, DO [facility] 04/02/2024, 12:15 PM  If 7PM-7AM, please contact night-coverage [email or web address]

## 2024-04-02 NOTE — Progress Notes (Signed)
 IV removed and discharge instructions reviewed with patient and family.  Scripts sent to pharmacy and family to give ride home.

## 2024-04-26 ENCOUNTER — Other Ambulatory Visit: Payer: Self-pay

## 2024-04-26 ENCOUNTER — Emergency Department (HOSPITAL_COMMUNITY)

## 2024-04-26 ENCOUNTER — Emergency Department (HOSPITAL_COMMUNITY)
Admission: EM | Admit: 2024-04-26 | Discharge: 2024-04-26 | Disposition: A | Discharge status: Home / Self Care | Attending: Emergency Medicine | Admitting: Emergency Medicine

## 2024-04-26 ENCOUNTER — Encounter (HOSPITAL_COMMUNITY): Payer: Self-pay

## 2024-04-26 DIAGNOSIS — S36892A Contusion of other intra-abdominal organs, initial encounter: Secondary | ICD-10-CM

## 2024-04-26 DIAGNOSIS — Z7901 Long term (current) use of anticoagulants: Secondary | ICD-10-CM

## 2024-04-26 DIAGNOSIS — K449 Diaphragmatic hernia without obstruction or gangrene: Secondary | ICD-10-CM | POA: Diagnosis not present

## 2024-04-26 DIAGNOSIS — X58XXXA Exposure to other specified factors, initial encounter: Secondary | ICD-10-CM | POA: Diagnosis not present

## 2024-04-26 DIAGNOSIS — E860 Dehydration: Secondary | ICD-10-CM

## 2024-04-26 DIAGNOSIS — J449 Chronic obstructive pulmonary disease, unspecified: Secondary | ICD-10-CM | POA: Diagnosis not present

## 2024-04-26 DIAGNOSIS — I82422 Acute embolism and thrombosis of left iliac vein: Secondary | ICD-10-CM | POA: Diagnosis not present

## 2024-04-26 DIAGNOSIS — R109 Unspecified abdominal pain: Secondary | ICD-10-CM | POA: Diagnosis present

## 2024-04-26 LAB — URINALYSIS, ROUTINE W REFLEX MICROSCOPIC
Bilirubin Urine: NEGATIVE
Glucose, UA: NEGATIVE mg/dL
Hgb urine dipstick: NEGATIVE
Ketones, ur: NEGATIVE mg/dL
Leukocytes,Ua: NEGATIVE
Nitrite: NEGATIVE
Protein, ur: NEGATIVE mg/dL
Specific Gravity, Urine: <1.005 — ABNORMAL LOW (ref 1.005–1.030)
pH: 5.5 (ref 5.0–8.0)

## 2024-04-26 LAB — I-STAT CHEM 8, ED
BUN: 9 mg/dL (ref 8–23)
Calcium, Ion: 1.16 mmol/L (ref 1.15–1.40)
Chloride: 105 mmol/L (ref 98–111)
Creatinine, Ser: 0.6 mg/dL (ref 0.44–1.00)
Glucose, Bld: 118 mg/dL — ABNORMAL HIGH (ref 70–99)
HCT: 50 % — ABNORMAL HIGH (ref 36.0–46.0)
Hemoglobin: 17 g/dL — ABNORMAL HIGH (ref 12.0–15.0)
Potassium: 3.6 mmol/L (ref 3.5–5.1)
Sodium: 144 mmol/L (ref 135–145)
TCO2: 25 mmol/L (ref 22–32)

## 2024-04-26 LAB — COMPREHENSIVE METABOLIC PANEL WITH GFR
ALT: 24 U/L (ref 0–44)
AST: 40 U/L (ref 15–41)
Albumin: 3.3 g/dL — ABNORMAL LOW (ref 3.5–5.0)
Alkaline Phosphatase: 70 U/L (ref 38–126)
Anion gap: 9 (ref 5–15)
BUN: 8 mg/dL (ref 8–23)
CO2: 26 mmol/L (ref 22–32)
Calcium: 8.9 mg/dL (ref 8.9–10.3)
Chloride: 106 mmol/L (ref 98–111)
Creatinine, Ser: 0.62 mg/dL (ref 0.44–1.00)
GFR, Estimated: >60 mL/min (ref >60)
Glucose, Bld: 111 mg/dL — ABNORMAL HIGH (ref 70–99)
Potassium: 3.6 mmol/L (ref 3.5–5.1)
Sodium: 141 mmol/L (ref 135–145)
Total Bilirubin: 0.6 mg/dL (ref 0.0–1.2)
Total Protein: 6.9 g/dL (ref 6.5–8.1)

## 2024-04-26 LAB — CBC WITH DIFFERENTIAL/PLATELET
Abs Immature Granulocytes: 0.06 10*3/uL (ref 0.00–0.07)
Basophils Absolute: 0 10*3/uL (ref 0.0–0.1)
Basophils Relative: 0 %
Eosinophils Absolute: 0.1 10*3/uL (ref 0.0–0.5)
Eosinophils Relative: 1 %
HCT: 48.5 % — ABNORMAL HIGH (ref 36.0–46.0)
Hemoglobin: 15.2 g/dL — ABNORMAL HIGH (ref 12.0–15.0)
Immature Granulocytes: 0 %
Lymphocytes Relative: 11 %
Lymphs Abs: 1.6 10*3/uL (ref 0.7–4.0)
MCH: 31.4 pg (ref 26.0–34.0)
MCHC: 31.3 g/dL (ref 30.0–36.0)
MCV: 100.2 fL — ABNORMAL HIGH (ref 80.0–100.0)
Monocytes Absolute: 0.8 10*3/uL (ref 0.1–1.0)
Monocytes Relative: 5 %
Neutro Abs: 11.9 10*3/uL — ABNORMAL HIGH (ref 1.7–7.7)
Neutrophils Relative %: 83 %
Platelets: 207 10*3/uL (ref 150–400)
RBC: 4.84 MIL/uL (ref 3.87–5.11)
RDW: 16.3 % — ABNORMAL HIGH (ref 11.5–15.5)
WBC: 14.6 10*3/uL — ABNORMAL HIGH (ref 4.0–10.5)
nRBC: 0 % (ref 0.0–0.2)

## 2024-04-26 LAB — LIPASE, BLOOD: Lipase: 22 U/L (ref 11–51)

## 2024-04-26 LAB — MAGNESIUM: Magnesium: 2.1 mg/dL (ref 1.7–2.4)

## 2024-04-26 MED ORDER — IOHEXOL 350 MG/ML SOLN
80.0000 mL | Freq: Once | INTRAVENOUS | Status: AC | PRN
  Administered 2024-04-26: 80 mL via INTRAVENOUS

## 2024-04-26 MED ORDER — SODIUM CHLORIDE 0.9 % IV BOLUS
1000.0000 mL | Freq: Once | INTRAVENOUS | Status: AC
  Administered 2024-04-26: 1000 mL via INTRAVENOUS

## 2024-04-26 MED ORDER — ONDANSETRON HCL 4 MG/2ML IJ SOLN
4.0000 mg | Freq: Once | INTRAMUSCULAR | Status: AC
  Administered 2024-04-26: 4 mg via INTRAVENOUS
  Filled 2024-04-26: qty 2

## 2024-04-26 MED ORDER — IOHEXOL 300 MG/ML  SOLN
100.0000 mL | Freq: Once | INTRAMUSCULAR | Status: AC | PRN
  Administered 2024-04-26: 100 mL via INTRAVENOUS

## 2024-04-26 MED ORDER — HYDROMORPHONE HCL 1 MG/ML IJ SOLN
1.0000 mg | Freq: Once | INTRAMUSCULAR | Status: AC
  Administered 2024-04-26: 1 mg via INTRAVENOUS
  Filled 2024-04-26: qty 1

## 2024-04-26 MED ORDER — MORPHINE SULFATE (PF) 4 MG/ML IV SOLN
4.0000 mg | Freq: Once | INTRAVENOUS | Status: AC
  Administered 2024-04-26: 4 mg via INTRAVENOUS
  Filled 2024-04-26: qty 1

## 2024-04-26 NOTE — ED Provider Notes (Signed)
 [place] EMERGENCY DEPARTMENT AT [facility] Provider Note   CSN: [id number] Arrival date & time: 04/26/24  1712     History  Chief Complaint  Patient presents with   Abdominal Pain    Norma Anthony is a 68 y.o. female.  Pt is a 68 yo female with pmhx significant for COPD with 2L oxygen via [place], fibromyalgia, DVT/PE s/p IVC filter, sleep apnea, GERD, and diverticulitis.  Pt was hospitalized from 4/16-4/20 for acute diverticulitis.  While hospitalized, she developed a spontaneous left chest wall hematoma.  She was told to stop the Xarelto , but her pcp told her to continue taking it, so she's been taking it.  She was d/c with omnicef  and flagyl .  She took those meds and has been doing well until today when she developed n/v/d and LLQ pain again.  She feels the same as she did when she was admitted in April.  No fevers.         Home Medications Prior to Admission medications   Medication Sig Start Date End Date Taking? Authorizing Provider  albuterol  (VENTOLIN  HFA) 108 (90 Base) MCG/ACT inhaler Inhale 2 puffs into the lungs every 6 (six) hours as needed for wheezing or shortness of breath. 05/12/21   [provider]  ALPRAZolam  (XANAX ) 1 MG tablet Take 1 tablet (1 mg total) by mouth every 6 (six) hours as needed for anxiety or sleep. 03/16/23   Earnie Gola, PA-C  ascorbic acid  (VITAMIN C ) 1000 MG tablet Take 1,000 mg by mouth daily.    [provider]  b complex vitamins capsule Take 1 capsule by mouth daily.    [provider]  cholecalciferol (VITAMIN D3) 25 MCG (1000 UNIT) tablet Take 5,000 Units by mouth daily.    [provider]  clotrimazole -betamethasone  (LOTRISONE ) cream Apply 1 Application topically 2 (two) times daily as needed (for irritation under the breasts or abdominal folds).    [provider]  DULoxetine  (CYMBALTA ) 60 MG capsule Take 60 mg by mouth at bedtime. 09/14/18   [provider]  famotidine   (PEPCID ) 20 MG tablet TAKE 1 TABLET BY MOUTH AFTER SUPPER Patient taking differently: Take 20 mg by mouth at bedtime. 04/01/23   Wilder Handy, MD  Ferrous Fumarate -Folic Acid  324-1 MG TABS Take 1 tablet by mouth daily. Hematinic 02/29/20   [provider]  fluconazole  (DIFLUCAN ) 200 MG tablet Take 200 mg by mouth daily. 06/25/23   [provider]  gabapentin  (NEURONTIN ) 400 MG capsule Take 400 mg by mouth 2 (two) times daily. 08/21/22   [provider]  morphine  (MSIR) 15 MG tablet Take 15 mg by mouth 3 (three) times daily as needed for moderate pain (pain score 4-6). 12/06/23   [provider]  naloxone  (NARCAN ) nasal spray 4 mg/0.1 mL Place 1 spray into the nose once as needed (accidental over dose). 10/16/21   [provider]  nutrition supplement, JUVEN, (JUVEN) PACK Take 1 packet by mouth 2 (two) times daily between meals.    [provider]  ondansetron  (ZOFRAN ) 4 MG tablet Take 1 tablet (4 mg total) by mouth every 8 (eight) hours as needed for nausea or vomiting. 08/31/23 08/30/24  Harman Lightning, PA-C  pantoprazole  (PROTONIX ) 40 MG tablet Take 40 mg by mouth daily with breakfast. 06/30/16   [provider]  promethazine  (PHENERGAN ) 25 MG tablet Take 25 mg by mouth every 6 (six) hours as needed for nausea or vomiting.    [provider]  tiZANidine  (ZANAFLEX ) 2 MG tablet Take 2 mg by mouth every 12 (twelve) hours as needed for muscle spasms. 02/29/24   [provider]  zinc  gluconate 50 MG tablet Take 50 mg by mouth daily.    [provider]      Allergies    Belbuca  [buprenorphine  hcl], Pregabalin, Compazine  [prochlorperazine ], Droperidol, and Ergotamine-caffeine    Review of Systems   Review of Systems  Gastrointestinal:  Positive for abdominal pain, diarrhea, nausea and vomiting.  All other systems reviewed and are negative.   Physical Exam Updated Vital Signs BP 121/65   Pulse 96   Temp 98.6 F (37  C)   Resp 15   Ht 5\' 5"  (1.651 m)   Wt 107.3 kg   SpO2 97%   BMI 39.36 kg/m  Physical Exam Vitals and nursing note reviewed.  Constitutional:      Appearance: She is well-developed. She is obese.  HENT:     Head: Normocephalic and atraumatic.     Mouth/Throat:     Mouth: Mucous membranes are moist.     Pharynx: Oropharynx is clear.  Eyes:     Extraocular Movements: Extraocular movements intact.     Pupils: Pupils are equal, round, and reactive to light.  Cardiovascular:     Rate and Rhythm: Normal rate and regular rhythm.     Heart sounds: Normal heart sounds.  Pulmonary:     Effort: Pulmonary effort is normal.     Breath sounds: Normal breath sounds.  Abdominal:     General: Abdomen is flat. Bowel sounds are normal.     Palpations: Abdomen is soft.     Tenderness: There is abdominal tenderness in the left lower quadrant.  Skin:    General: Skin is warm.     Capillary Refill: Capillary refill takes less than 2 seconds.  Neurological:     General: No focal deficit present.     Mental Status: She is alert and oriented to person, place, and time.  Psychiatric:        Mood and Affect: Mood normal.        Behavior: Behavior normal.     ED Results / Procedures / Treatments   Labs (all labs ordered are listed, but only abnormal results are displayed) Labs Reviewed  CBC WITH DIFFERENTIAL/PLATELET - Abnormal; Notable for the following components:      Result Value   WBC 14.6 (*)    Hemoglobin 15.2 (*)    HCT 48.5 (*)    MCV 100.2 (*)    RDW 16.3 (*)    Neutro Abs 11.9 (*)    All other components within normal limits  COMPREHENSIVE METABOLIC PANEL WITH GFR - Abnormal; Notable for the following components:   Glucose, Bld 111 (*)    Albumin 3.3 (*)    All other components within normal limits  URINALYSIS, ROUTINE W REFLEX MICROSCOPIC - Abnormal; Notable for the following components:   Specific Gravity, Urine <1.005 (*)    All other components within normal limits   I-STAT CHEM 8, ED - Abnormal; Notable for the following components:   Glucose, Bld 118 (*)    Hemoglobin 17.0 (*)    HCT 50.0 (*)    All other components within normal limits  LIPASE, BLOOD  MAGNESIUM     EKG EKG Interpretation Date/Time:  Wednesday Apr 26 2024 18:48:45 EDT Ventricular Rate:  98 PR Interval:  148 QRS Duration:  96 QT Interval:  368 QTC Calculation: 470 R Axis:   -  32  Text Interpretation: Sinus rhythm Abnormal R-wave progression, early transition Left ventricular hypertrophy Anterior Q waves, possibly due to LVH Since last tracing rate slower qtc nl Confirmed by Sueellen Emery [phone]) on 04/26/2024 7:38:14 PM  Radiology CT Angio Abd/Pel W and/or Wo Contrast Result Date: 04/26/2024 CLINICAL DATA:  Aneurysm, renal or visceral EXAM: CTA ABDOMEN AND PELVIS WITHOUT AND WITH CONTRAST TECHNIQUE: Multidetector CT imaging of the abdomen and pelvis was performed using the standard protocol during bolus administration of intravenous contrast. Multiplanar reconstructed images and MIPs were obtained and reviewed to evaluate the vascular anatomy. RADIATION DOSE REDUCTION: This exam was performed according to the departmental dose-optimization program which includes automated exposure control, adjustment of the mA and/or kV according to patient size and/or use of iterative reconstruction technique. CONTRAST:  80mL OMNIPAQUE  IOHEXOL  350 MG/ML SOLN COMPARISON:  None Available. FINDINGS: VASCULAR Aorta: No aortic aneurysm or dissection. No hemodynamically significant stenosis. Celiac: Patent without acute thrombus, aneurysm, or dissection. No hemodynamically significant stenosis. While a branch of the splenic artery courses posteriorly to the mesenteric hematoma, with adjacent pancreatic arcades noted, no findings of active bleeding visualized. SMA: Patent without acute thrombus, aneurysm, or dissection. No hemodynamically significant stenosis. Renals: Patent without acute thrombus, aneurysm,  or dissection. No hemodynamically significant stenosis. IMA: Patent without acute thrombus, aneurysm, or dissection. No hemodynamically significant stenosis. Inflow: Patent without acute thrombus, aneurysm, or dissection. No hemodynamically significant stenosis. Proximal Outflow: The bilateral common femoral and visualized portions of the superficial and profunda femoral arteries are patent without acute thrombus, aneurysm, or dissection. No hemodynamically significant stenosis. Veins: Nondiagnostic evaluation due to arterial phase opacification. The deep venous thrombosis in the pelvis was not visualized. Similarly positioned IVC filter, apex just below the insertion of the left renal vein. Review of the MIP images confirms the above findings. NON-VASCULAR Lower chest: No focal airspace consolidation or pleural effusion. Hepatobiliary: No mass. No radiopaque stones or wall thickening of the gallbladder. No intrahepatic or extrahepatic biliary ductal dilation. The portal veins are patent. Pancreas: No mass or main ductal dilation. No peripancreatic inflammation or fluid collection. Spleen: Normal size. No mass. Adrenals/Urinary Tract: No adrenal masses. A couple of subcentimeter hypodensities are noted in the left kidney, too small to definitively characterize, but likely small cysts. No nephrolithiasis or hydronephrosis. The urinary bladder is distended with layering contrast material present, and no focal abnormality visualized. Stomach/Bowel: Redemonstrated findings of a moderate sized hiatal hernia with most of the proximal stomach in the lower mediastinum. Postsurgical changes also present in this region without associated fluid collection. No small bowel wall thickening or inflammation. No small bowel obstruction. Normal appendix. Similar, moderate focal wall thickening of the distal transverse colon just proximal to the splenic flexure, measuring approximately 3 cm in length. Overall, unchanged mesenteric  hematoma in the gastrocolic ligament, with adjacent stranding. The smaller superiorly adjacent collection is also unchanged measuring 2.1 cm. Reproductive: Hysterectomy. No concerning adnexal mass. No free pelvic fluid. Other: No pneumoperitoneum or ascites. ventral abdominal hernia mesh without associated fluid collection. Musculoskeletal: No acute fracture or destructive lesion. Diffuse osteopenia. Multilevel degenerative disc disease of the spine. IMPRESSION: 1. Similar size of the mesenteric hematoma in the gastrocolic ligament. No active bleeding present. 2. No aortic aneurysm or aortic dissection. 3. The deep venous thrombosis in the pelvic veins on the prior study was not visualized due to the arterial phase of contrast. Electronically Signed   By: Rance Burrows M.D.   On: 04/26/2024 20:48   CT ABDOMEN PELVIS  W CONTRAST Result Date: 04/26/2024 CLINICAL DATA:  LLQ abdominal pain, history of diverticulitis EXAM: CT ABDOMEN AND PELVIS WITH CONTRAST TECHNIQUE: Multidetector CT imaging of the abdomen and pelvis was performed using the standard protocol following bolus administration of intravenous contrast. RADIATION DOSE REDUCTION: This exam was performed according to the departmental dose-optimization program which includes automated exposure control, adjustment of the mA and/or kV according to patient size and/or use of iterative reconstruction technique. CONTRAST:  OMNIPAQUE  IOHEXOL  300 MG/ML  SOLN COMPARISON:  March 29, 2024 FINDINGS: Lower chest: No focal airspace consolidation or pleural effusion. Hepatobiliary: No mass. Mild diffuse hepatic steatosis. No radiopaque stones or wall thickening of the gallbladder. No intrahepatic or extrahepatic biliary ductal dilation. The portal veins are patent. Pancreas: No mass or main ductal dilation. No peripancreatic inflammation or fluid collection. Spleen: Normal size. No mass. Adrenals/Urinary Tract: No adrenal masses. A couple of subcentimeter  hypodensities are noted in the left kidney, too small to definitively characterize, but likely small cysts. no nephrolithiasis or hydronephrosis. The urinary bladder is distended without focal abnormality. Stomach/Bowel: Redemonstrated findings of a moderate sized hiatal hernia with most of the proximal stomach in the lower mediastinum. Postsurgical changes also present in this region without associated fluid collection. No small bowel wall thickening or inflammation. No small bowel obstruction.Normal appendix. Focal wall thickening of the distal transverse colon adjacent to the splenic flexure, measuring proximally 3 cm in length (axial 13). hyperdense fluid collection in the gastrocolic ligament, measuring 4.9 x 4.9 x 5.4 cm with adjacent stranding. There is a smaller adjacent collection anteriorly measuring 2.1 cm (axial 12). Vascular/Lymphatic: No aortic aneurysm. Scattered aortoiliac atherosclerosis. No intraabdominal or pelvic lymphadenopathy. Nonocclusive deep venous thrombosis has developed in the interim in the left common iliac vein extending into the infra hepatic IVC. Similarly positioned IVC filter, apex just below the insertion of the left renal vein. Reproductive: Hysterectomy. No concerning adnexal mass.No free pelvic fluid. Other: No pneumoperitoneum, ascites, or mesenteric inflammation. Ventral abdominal hernia mesh without associated fluid collection. Musculoskeletal: No acute fracture or destructive lesion. Diffuse osteopenia. Multilevel degenerative disc disease of the spine. IMPRESSION: 1. 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 A follow-up CTA of the abdomen and pelvis is recommended. 2. Nearly occlusive DVT has developed in the upstream left common iliac vein extending into the lower IVC. 3. Focal wall thickening of  the distal transverse colon adjacent to the splenic flexure measuring approximately 3 cm in length, likely due to underdistension and reactive changes from to the adjacent hematoma. 4. Mild diffuse hepatic steatosis. Critical Value/emergent results were called by telephone at the time of interpretation on 04/26/2024 at 7:58 pm to provider Kenyanna Grzesiak , who verbally acknowledged these results. Electronically Signed   By: Rance Burrows M.D.   On: 04/26/2024 20:04    Procedures Procedures    Medications Ordered in ED Medications  sodium chloride  0.9 % bolus 1,000 mL (1,000 mLs Intravenous Bolus 04/26/24 1850)  morphine  (PF) 4 MG/ML injection 4 mg (4 mg Intravenous Given 04/26/24 1853)  ondansetron  (ZOFRAN ) injection 4 mg (4 mg Intravenous Given 04/26/24 1851)  iohexol  (OMNIPAQUE ) 300 MG/ML solution 100 mL (100 mLs Intravenous Contrast Given 04/26/24 1917)  iohexol  (OMNIPAQUE ) 350 MG/ML injection 80 mL (80 mLs Intravenous Contrast Given 04/26/24 2013)  HYDROmorphone  (DILAUDID ) injection 1 mg (1 mg Intravenous Given 04/26/24 2041)  ondansetron  (ZOFRAN ) injection 4 mg (4 mg Intravenous Given 04/26/24 2040)    ED Course/ Medical Decision Making/ A&P                                 Medical Decision Making Amount and/or Complexity of Data Reviewed Labs: ordered. Radiology: ordered.  Risk Prescription drug management.    This patient presents to the ED for concern of abd pain, this involves an extensive number of treatment options, and is a complaint that carries with it a high risk of complications and morbidity.  The differential diagnosis includes diverticulitis, diverticular abscess, uti, pyelo, kidney stone   Co morbidities that complicate the patient evaluation  COPD with 2L oxygen via [place], fibromyalgia, DVT/PE s/p IVC filter (Xarelto  d/c'd), sleep apnea, GERD, and diverticulitis   Additional history obtained:  Additional history obtained from epic chart review External records from  outside source obtained and reviewed including husband   Lab Tests:  I Ordered, and personally interpreted labs.  The pertinent results include:  cbc with wbc elevated at 14.6, istat chem 8 nl, ua nl   Imaging Studies ordered:  I ordered imaging studies including ct abd/pelvis and ct angio I independently visualized and interpreted imaging which showed  CT abd/pelvis:  Interval development of a moderate sized, hyperdense fluid  collection in the left upper quadrant (gastrocolic ligament),  measuring 4.9 x 4.9 x 5.4 cm with adjacent stranding, favored to  represent a mesenteric hematoma. A follow-up CTA of the abdomen and  pelvis is recommended.  2. Nearly occlusive DVT has developed in the upstream left common  iliac vein extending into the lower IVC.  3. Focal wall thickening of the distal transverse colon adjacent to  the splenic flexure measuring approximately 3 cm in length, likely  due to underdistension and reactive changes from to the adjacent  hematoma.  4. Mild diffuse hepatic steatosis.   I agree with the radiologist interpretation   Cardiac Monitoring:  The patient was maintained on a cardiac monitor.  I personally viewed and interpreted the cardiac monitored which showed an underlying rhythm of: nsr   Medicines ordered and prescription drug management:  I ordered medication including ivfs/morphine /zofran   for sx  Reevaluation of the patient after these medicines showed that the patient improved I have reviewed the patients home medicines and have made adjustments as needed   Test Considered:  ct   Critical Interventions:  ivfs   Problem List / ED Course:  N/v/d:  likely viral.  Improved after meds and fluids.  Pt is tolerating po fluids. Spontaneous mesenteric hematoma:  pt's pcp told her to continue Xarelto .  I have told pt again to stop it.   Iliac vein DVT:  pt does have a IVC filter.  Pt referred to the DVT clinic.   Reevaluation:  After the  interventions noted above, I reevaluated the patient and found that they have :improved   Social Determinants of Health:  Lives at home   Dispostion:  After consideration of the diagnostic results and the patients response to treatment, I feel that the patent would benefit from discharge with outpatient f/u.          Final Clinical Impression(s) / ED Diagnoses Final diagnoses:  Dehydration  Mesenteric hematoma, initial encounter  On rivaroxaban  therapy  Hiatal hernia  Acute deep vein thrombosis (DVT) of iliac vein of left lower extremity (HCC)    Rx / DC  Orders ED Discharge Orders          Ordered    AMB Referral to Deep Vein Thrombosis Clinic       Comments: IVC filter   04/26/24 2313              Sueellen Emery, MD 04/26/24 2334

## 2024-04-26 NOTE — Discharge Instructions (Addendum)
 STOP your Xarelto .  Take your Zofran  and Dilaudid  that you have at home.

## 2024-04-26 NOTE — ED Notes (Signed)
 Pt lab results given to EDP

## 2024-04-26 NOTE — ED Triage Notes (Signed)
 Pt has abd pain, vomiting and diarrhea since this morning.  Pt reports recent hx of diverticulitis and she is concerned she may have it again.

## 2024-04-28 ENCOUNTER — Telehealth: Payer: Self-pay

## 2024-04-28 NOTE — Telephone Encounter (Signed)
 Pt called to schedule f/u appt with MD, after being seen in ED this week and was found to have an iliac vein DVT extending into IVC filter. CTA was completed in ED. Pt has been scheduled later this month off of referral to see MD and is aware of this appt.

## 2024-05-10 ENCOUNTER — Encounter: Payer: Self-pay | Admitting: Vascular Surgery

## 2024-05-10 ENCOUNTER — Ambulatory Visit: Attending: Vascular Surgery | Admitting: Vascular Surgery

## 2024-05-10 VITALS — BP 121/80 | HR 81 | Temp 97.9°F

## 2024-05-10 DIAGNOSIS — Z86718 Personal history of other venous thrombosis and embolism: Secondary | ICD-10-CM | POA: Diagnosis not present

## 2024-05-10 DIAGNOSIS — Z95828 Presence of other vascular implants and grafts: Secondary | ICD-10-CM | POA: Diagnosis not present

## 2024-05-10 NOTE — Progress Notes (Signed)
 Patient ID: Norma Anthony, female   DOB: 1956-03-09, 68 y.o.   MRN: [id number]  Reason for Consult: Follow-up   Referred by Sueellen Emery, MD  Subjective:     HPI:  Norma Anthony is a 68 y.o. female history of multiple DVTs in the past as well as 1 previous pulmonary embolus and a strong family history of thrombotic complications.  I have placed 2 previous filters in the past from orthopedic procedures and most recently in January we elected to keep her filter as she was considering further orthopedic procedures.  She has remained mostly nonambulatory given orthopedic related weakness.  Most recently she was admitted with concern for diverticulitis and Xarelto  was discontinued.  2 weeks later she was readmitted and found to have thrombus in her IVC and left common iliac vein.  She has not had any increased swelling of the lower extremities.  She has now been off of Xarelto  for 2 weeks total.  Past Medical History:  Diagnosis Date   Acute respiratory failure with hypoxia (HCC) 03/23/2022   Acute respiratory failure with hypoxia (HCC) 03/23/2022   Anemia    Anxiety    Arthritis    CAD (coronary artery disease)    pt deneies   COPD (chronic obstructive pulmonary disease) (HCC)    Depression    Dyspnea    Fibromyalgia    Generalized anxiety disorder 12/11/2022   GERD (gastroesophageal reflux disease)    Hiatal hernia    History of kidney stones    Lung nodule 03/24/2022   Migraine headache    Multinodular goiter    Nephrolithiasis    Obstructive sleep apnea syndrome 07/01/2021   Opioid dependence (HCC) 03/23/2022   Pneumonia    Pulmonary embolism (HCC) 1996   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   Sleep apnea    Thyroid  nodule    Family History  Problem Relation Age of Onset   Hypertension Mother    Past Surgical History:   Procedure Laterality Date   ABDOMINAL HYSTERECTOMY     BIOPSY THYROID      EXCISIONAL TOTAL KNEE ARTHROPLASTY WITH ANTIBIOTIC SPACERS Left 03/10/2023   Procedure: Irrigation and debridement left knee, resection total knee arthroplasty, antibiotic spacer placement;  Surgeon: Adonica Hoose, MD;  Location: [facility];  Service: Orthopedics;  Laterality: Left;   HERNIA REPAIR     I & D EXTREMITY Left 12/11/2022   Procedure: IRRIGATION AND DEBRIDEMENT EXTREMITY;  Surgeon: Janeth Medicus, MD;  Location: [facility];  Service: Orthopedics;  Laterality: Left;   IVC FILTER INSERTION N/A 09/28/2022   Procedure: IVC FILTER INSERTION;  Surgeon: Adine Hoof, MD;  Location: [facility] INVASIVE CV LAB;  Service: Cardiovascular;  Laterality: N/A;   IVC FILTER INSERTION N/A 08/09/2023   Procedure: IVC FILTER INSERTION;  Surgeon: Adine Hoof, MD;  Location: [facility] INVASIVE CV LAB;  Service: Cardiovascular;  Laterality: N/A;   IVC FILTER REMOVAL N/A 01/18/2023   Procedure: IVC FILTER REMOVAL;  Surgeon: Adine Hoof, MD;  Location: [facility] INVASIVE CV LAB;  Service: Cardiovascular;  Laterality: N/A;   KIDNEY STONE SURGERY     KNEE ARTHROTOMY Left 02/03/2023   Procedure: KNEE ARTHROTOMY WITH SOFT TISSUE REPAIR;  Surgeon: Adonica Hoose, MD;  Location: [facility];  Service: Orthopedics;  Laterality: Left;  75   LAPAROSCOPIC NISSEN FUNDOPLICATION     PAROTIDECTOMY  REMOVAL OF CEMENTED SPACER KNEE Left 08/25/2023   Procedure: Knee spacer removal;  Surgeon: Adonica Hoose, MD;  Location: [facility];  Service: Orthopedics;  Laterality: Left;  350   TONSILLECTOMY     TOTAL KNEE ARTHROPLASTY Left 11/04/2022   Procedure: TOTAL KNEE ARTHROPLASTY;  Surgeon: Orvan Blanch, MD;  Location: [facility];  Service: Orthopedics;  Laterality: Left;   TOTAL KNEE REVISION Left 08/25/2023   Procedure: TOTAL KNEE REVISION;  Surgeon: Adonica Hoose, MD;  Location: [facility];  Service: Orthopedics;  Laterality: Left;  350     Short Social History:  Social History   Tobacco Use   Smoking status: Former    Current packs/day: 0.00    Average packs/day: 0.5 packs/day for 10.0 years (5.0 ttl pk-yrs)    Types: Cigarettes    Start date: 02/13/2011    Quit date: 02/12/2021    Years since quitting: 3.2   Smokeless tobacco: Never  Substance Use Topics   Alcohol  use: Not Currently    Allergies  Allergen Reactions   Belbuca  [Buprenorphine  Hcl] Other (See Comments)    " Horrible headache"    Pregabalin Other (See Comments)    Paralysis present (finding)- not tolerated  Pt stated she could not move her body but she could think   Compazine  [Prochlorperazine ] Anxiety   Droperidol Anxiety   Ergotamine-Caffeine Anxiety and Hypertension    Increased systolic arterial pressure (finding)     Current Outpatient Medications  Medication Sig Dispense Refill   albuterol  (VENTOLIN  HFA) 108 (90 Base) MCG/ACT inhaler Inhale 2 puffs into the lungs every 6 (six) hours as needed for wheezing or shortness of breath.     ALPRAZolam  (XANAX ) 1 MG tablet Take 1 tablet (1 mg total) by mouth every 6 (six) hours as needed for anxiety or sleep. 30 tablet 0   ascorbic acid  (VITAMIN C ) 1000 MG tablet Take 1,000 mg by mouth daily.     b complex vitamins capsule Take 1 capsule by mouth daily.     cholecalciferol (VITAMIN D3) 25 MCG (1000 UNIT) tablet Take 5,000 Units by mouth daily.     clotrimazole -betamethasone  (LOTRISONE ) cream Apply 1 Application topically 2 (two) times daily as needed (for irritation under the breasts or abdominal folds).     DULoxetine  (CYMBALTA ) 60 MG capsule Take 60 mg by mouth at bedtime.     famotidine  (PEPCID ) 20 MG tablet TAKE 1 TABLET BY MOUTH AFTER SUPPER (Patient taking differently: Take 20 mg by mouth at bedtime.) 30 tablet 0   Ferrous Fumarate -Folic Acid  324-1 MG TABS Take 1 tablet by mouth daily. Hematinic     fluconazole  (DIFLUCAN ) 200 MG tablet Take 200 mg by mouth daily.     gabapentin  (NEURONTIN )  400 MG capsule Take 400 mg by mouth 2 (two) times daily.     HYDROmorphone  (DILAUDID ) 2 MG tablet Take 2 mg by mouth 4 (four) times daily.     naloxone  (NARCAN ) nasal spray 4 mg/0.1 mL Place 1 spray into the nose once as needed (accidental over dose).     nutrition supplement, JUVEN, (JUVEN) PACK Take 1 packet by mouth 2 (two) times daily between meals.     ondansetron  (ZOFRAN ) 4 MG tablet Take 1 tablet (4 mg total) by mouth every 8 (eight) hours as needed for nausea or vomiting. 30 tablet 0   pantoprazole  (PROTONIX ) 40 MG tablet Take 40 mg by mouth daily with breakfast.     promethazine  (PHENERGAN ) 25 MG tablet Take 25 mg by mouth every 6 (  six) hours as needed for nausea or vomiting.     tiZANidine  (ZANAFLEX ) 2 MG tablet Take 2 mg by mouth every 12 (twelve) hours as needed for muscle spasms.     zinc  gluconate 50 MG tablet Take 50 mg by mouth daily.     XARELTO  20 MG TABS tablet Take 20 mg by mouth daily with supper. (Patient not taking: Reported on 05/10/2024)     No current facility-administered medications for this visit.    Review of Systems  Constitutional: Positive for fatigue.  HENT: HENT negative.  Eyes: Eyes negative.  Cardiovascular: Cardiovascular negative.  GI: Gastrointestinal negative.  Musculoskeletal: Positive for gait problem and leg pain.  Skin: Skin negative.  Neurological: Positive for focal weakness.  Hematologic: Hematologic/lymphatic negative.  Psychiatric: Psychiatric negative.        Objective:  Objective   Vitals:   05/10/24 1111  BP: 121/80  Pulse: 81  Temp: 97.9 F (36.6 C)   There is no height or weight on file to calculate BMI.  Physical Exam HENT:     Head: Normocephalic.     Nose: Nose normal.     Mouth/Throat:     Mouth: Mucous membranes are moist.  Eyes:     Pupils: Pupils are equal, round, and reactive to light.  Cardiovascular:     Pulses: Normal pulses.  Pulmonary:     Effort: Pulmonary effort is normal.  Abdominal:      General: Abdomen is flat.  Musculoskeletal:     Right lower leg: No edema.     Left lower leg: No edema.  Skin:    Capillary Refill: Capillary refill takes less than 2 seconds.  Neurological:     General: No focal deficit present.     Mental Status: She is alert.  Psychiatric:        Mood and Affect: Mood normal.        Thought Content: Thought content normal.        Judgment: Judgment normal.     Data: CT IMPRESSION: 1. 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 A follow-up CTA of the abdomen and pelvis is recommended. 2. Nearly occlusive DVT has developed in the upstream left common iliac vein extending into the lower IVC. 3. Focal wall thickening of the distal transverse colon adjacent to the splenic flexure measuring approximately 3 cm in length, likely due to underdistension and reactive changes from to the adjacent hematoma. 4. Mild diffuse hepatic steatosis.  CT IMPRESSION: 1. Similar size of the mesenteric hematoma in the gastrocolic ligament. No active bleeding present. 2. No aortic aneurysm or aortic dissection. 3. The deep venous thrombosis in the pelvic veins on the prior study was not visualized due to the arterial phase of contrast.     Assessment/Plan:    68 year old female with history as above now with IVC filter which we have elected to leave.  Thankfully this filter was in place when she had her most recent distal IVC and left common iliac vein thrombosis although this does not appear to have traveled to the filter.  She has not had any recent bleeding complications and as such I recommended restarting her Xarelto  when she is comfortable.  We will plan to leave the filter in indefinitely until patient is completed with procedures and/or has become more ambulatory.  I have recommended  referral  to hematology for further evaluation.  All questions were answered with her husband and they demonstrate good understanding.     Adine Hoof MD [facility]

## 2024-05-16 ENCOUNTER — Other Ambulatory Visit (HOSPITAL_COMMUNITY): Payer: Self-pay

## 2024-05-16 ENCOUNTER — Other Ambulatory Visit (HOSPITAL_COMMUNITY): Payer: Self-pay | Admitting: Internal Medicine

## 2024-05-16 ENCOUNTER — Encounter (HOSPITAL_COMMUNITY): Payer: Self-pay

## 2024-05-16 DIAGNOSIS — N63 Unspecified lump in unspecified breast: Secondary | ICD-10-CM

## 2024-05-16 DIAGNOSIS — Z1231 Encounter for screening mammogram for malignant neoplasm of breast: Secondary | ICD-10-CM

## 2024-06-01 ENCOUNTER — Ambulatory Visit (HOSPITAL_COMMUNITY)
Admission: RE | Admit: 2024-06-01 | Discharge: 2024-06-01 | Disposition: A | Discharge status: Home / Self Care | Source: Ambulatory Visit

## 2024-06-01 ENCOUNTER — Encounter (HOSPITAL_COMMUNITY): Payer: Self-pay

## 2024-06-01 DIAGNOSIS — N63 Unspecified lump in unspecified breast: Secondary | ICD-10-CM | POA: Insufficient documentation

## 2024-06-01 DIAGNOSIS — A419 Sepsis, unspecified organism: Secondary | ICD-10-CM | POA: Diagnosis not present

## 2024-06-01 DIAGNOSIS — J189 Pneumonia, unspecified organism: Secondary | ICD-10-CM | POA: Diagnosis not present

## 2024-06-02 ENCOUNTER — Inpatient Hospital Stay (HOSPITAL_COMMUNITY)
Admission: EM | Admit: 2024-06-02 | Discharge: 2024-06-05 | DRG: 871 | Disposition: A | Discharge status: Home Health | Attending: Family Medicine | Admitting: Family Medicine

## 2024-06-02 ENCOUNTER — Encounter (HOSPITAL_COMMUNITY): Payer: Self-pay

## 2024-06-02 ENCOUNTER — Emergency Department (HOSPITAL_COMMUNITY)

## 2024-06-02 ENCOUNTER — Other Ambulatory Visit: Payer: Self-pay

## 2024-06-02 DIAGNOSIS — Z7901 Long term (current) use of anticoagulants: Secondary | ICD-10-CM | POA: Diagnosis not present

## 2024-06-02 DIAGNOSIS — J9621 Acute and chronic respiratory failure with hypoxia: Secondary | ICD-10-CM | POA: Diagnosis present

## 2024-06-02 DIAGNOSIS — Z1152 Encounter for screening for COVID-19: Secondary | ICD-10-CM

## 2024-06-02 DIAGNOSIS — A419 Sepsis, unspecified organism: Secondary | ICD-10-CM | POA: Diagnosis present

## 2024-06-02 DIAGNOSIS — Y831 Surgical operation with implant of artificial internal device as the cause of abnormal reaction of the patient, or of later complication, without mention of misadventure at the time of the procedure: Secondary | ICD-10-CM | POA: Diagnosis present

## 2024-06-02 DIAGNOSIS — Y838 Other surgical procedures as the cause of abnormal reaction of the patient, or of later complication, without mention of misadventure at the time of the procedure: Secondary | ICD-10-CM | POA: Diagnosis present

## 2024-06-02 DIAGNOSIS — M797 Fibromyalgia: Secondary | ICD-10-CM | POA: Diagnosis present

## 2024-06-02 DIAGNOSIS — F411 Generalized anxiety disorder: Secondary | ICD-10-CM | POA: Diagnosis present

## 2024-06-02 DIAGNOSIS — M9689 Other intraoperative and postprocedural complications and disorders of the musculoskeletal system: Secondary | ICD-10-CM | POA: Diagnosis present

## 2024-06-02 DIAGNOSIS — J440 Chronic obstructive pulmonary disease with acute lower respiratory infection: Secondary | ICD-10-CM | POA: Diagnosis present

## 2024-06-02 DIAGNOSIS — J9611 Chronic respiratory failure with hypoxia: Secondary | ICD-10-CM | POA: Diagnosis present

## 2024-06-02 DIAGNOSIS — I825Z1 Chronic embolism and thrombosis of unspecified deep veins of right distal lower extremity: Secondary | ICD-10-CM | POA: Diagnosis present

## 2024-06-02 DIAGNOSIS — J189 Pneumonia, unspecified organism: Secondary | ICD-10-CM | POA: Diagnosis present

## 2024-06-02 DIAGNOSIS — D696 Thrombocytopenia, unspecified: Secondary | ICD-10-CM | POA: Diagnosis present

## 2024-06-02 DIAGNOSIS — Z9981 Dependence on supplemental oxygen: Secondary | ICD-10-CM

## 2024-06-02 DIAGNOSIS — M25462 Effusion, left knee: Secondary | ICD-10-CM | POA: Diagnosis present

## 2024-06-02 DIAGNOSIS — Z86711 Personal history of pulmonary embolism: Secondary | ICD-10-CM

## 2024-06-02 DIAGNOSIS — K219 Gastro-esophageal reflux disease without esophagitis: Secondary | ICD-10-CM | POA: Diagnosis present

## 2024-06-02 DIAGNOSIS — Z95828 Presence of other vascular implants and grafts: Secondary | ICD-10-CM | POA: Diagnosis not present

## 2024-06-02 DIAGNOSIS — G894 Chronic pain syndrome: Secondary | ICD-10-CM | POA: Diagnosis present

## 2024-06-02 DIAGNOSIS — T8454XD Infection and inflammatory reaction due to internal left knee prosthesis, subsequent encounter: Secondary | ICD-10-CM

## 2024-06-02 DIAGNOSIS — Z87442 Personal history of urinary calculi: Secondary | ICD-10-CM

## 2024-06-02 DIAGNOSIS — D7589 Other specified diseases of blood and blood-forming organs: Secondary | ICD-10-CM | POA: Diagnosis present

## 2024-06-02 DIAGNOSIS — K5732 Diverticulitis of large intestine without perforation or abscess without bleeding: Secondary | ICD-10-CM | POA: Diagnosis present

## 2024-06-02 DIAGNOSIS — Z90710 Acquired absence of both cervix and uterus: Secondary | ICD-10-CM

## 2024-06-02 DIAGNOSIS — R6520 Severe sepsis without septic shock: Secondary | ICD-10-CM | POA: Diagnosis present

## 2024-06-02 DIAGNOSIS — I2510 Atherosclerotic heart disease of native coronary artery without angina pectoris: Secondary | ICD-10-CM | POA: Diagnosis present

## 2024-06-02 DIAGNOSIS — E66812 Obesity, class 2: Secondary | ICD-10-CM | POA: Diagnosis present

## 2024-06-02 DIAGNOSIS — Z79899 Other long term (current) drug therapy: Secondary | ICD-10-CM | POA: Diagnosis not present

## 2024-06-02 DIAGNOSIS — Z885 Allergy status to narcotic agent status: Secondary | ICD-10-CM

## 2024-06-02 DIAGNOSIS — Z8249 Family history of ischemic heart disease and other diseases of the circulatory system: Secondary | ICD-10-CM

## 2024-06-02 DIAGNOSIS — G4733 Obstructive sleep apnea (adult) (pediatric): Secondary | ICD-10-CM | POA: Diagnosis present

## 2024-06-02 DIAGNOSIS — Z87891 Personal history of nicotine dependence: Secondary | ICD-10-CM

## 2024-06-02 DIAGNOSIS — Z86718 Personal history of other venous thrombosis and embolism: Secondary | ICD-10-CM | POA: Diagnosis present

## 2024-06-02 DIAGNOSIS — Z6838 Body mass index (BMI) 38.0-38.9, adult: Secondary | ICD-10-CM

## 2024-06-02 DIAGNOSIS — J449 Chronic obstructive pulmonary disease, unspecified: Secondary | ICD-10-CM | POA: Diagnosis present

## 2024-06-02 DIAGNOSIS — I825Z9 Chronic embolism and thrombosis of unspecified deep veins of unspecified distal lower extremity: Secondary | ICD-10-CM | POA: Diagnosis present

## 2024-06-02 DIAGNOSIS — Z888 Allergy status to other drugs, medicaments and biological substances status: Secondary | ICD-10-CM

## 2024-06-02 DIAGNOSIS — Z96652 Presence of left artificial knee joint: Secondary | ICD-10-CM | POA: Diagnosis present

## 2024-06-02 LAB — CBC WITH DIFFERENTIAL/PLATELET
Abs Immature Granulocytes: 0.6 10*3/uL — ABNORMAL HIGH (ref 0.00–0.07)
Band Neutrophils: 12 %
Basophils Absolute: 0 10*3/uL (ref 0.0–0.1)
Basophils Relative: 0 %
Eosinophils Absolute: 0 10*3/uL (ref 0.0–0.5)
Eosinophils Relative: 0 %
HCT: 49.2 % — ABNORMAL HIGH (ref 36.0–46.0)
Hemoglobin: 15.7 g/dL — ABNORMAL HIGH (ref 12.0–15.0)
Lymphocytes Relative: 10 %
Lymphs Abs: 3 10*3/uL (ref 0.7–4.0)
MCH: 31.5 pg (ref 26.0–34.0)
MCHC: 31.9 g/dL (ref 30.0–36.0)
MCV: 98.8 fL (ref 80.0–100.0)
Metamyelocytes Relative: 2 %
Monocytes Absolute: 1.8 10*3/uL — ABNORMAL HIGH (ref 0.1–1.0)
Monocytes Relative: 6 %
Neutro Abs: 24.6 10*3/uL — ABNORMAL HIGH (ref 1.7–7.7)
Neutrophils Relative %: 70 %
Platelets: 170 10*3/uL (ref 150–400)
RBC: 4.98 MIL/uL (ref 3.87–5.11)
RDW: 15 % (ref 11.5–15.5)
WBC: 30 10*3/uL — ABNORMAL HIGH (ref 4.0–10.5)
nRBC: 0 % (ref 0.0–0.2)

## 2024-06-02 LAB — URINALYSIS, ROUTINE W REFLEX MICROSCOPIC
Bilirubin Urine: NEGATIVE
Glucose, UA: NEGATIVE mg/dL
Hgb urine dipstick: NEGATIVE
Ketones, ur: NEGATIVE mg/dL
Nitrite: NEGATIVE
Protein, ur: NEGATIVE mg/dL
Specific Gravity, Urine: 1.011 (ref 1.005–1.030)
pH: 5 (ref 5.0–8.0)

## 2024-06-02 LAB — COMPREHENSIVE METABOLIC PANEL WITH GFR
ALT: 17 U/L (ref 0–44)
AST: 21 U/L (ref 15–41)
Albumin: 2.8 g/dL — ABNORMAL LOW (ref 3.5–5.0)
Alkaline Phosphatase: 104 U/L (ref 38–126)
Anion gap: 10 (ref 5–15)
BUN: 15 mg/dL (ref 8–23)
CO2: 28 mmol/L (ref 22–32)
Calcium: 8.6 mg/dL — ABNORMAL LOW (ref 8.9–10.3)
Chloride: 102 mmol/L (ref 98–111)
Creatinine, Ser: 0.74 mg/dL (ref 0.44–1.00)
GFR, Estimated: >60 mL/min (ref >60)
Glucose, Bld: 118 mg/dL — ABNORMAL HIGH (ref 70–99)
Potassium: 3.8 mmol/L (ref 3.5–5.1)
Sodium: 140 mmol/L (ref 135–145)
Total Bilirubin: 0.6 mg/dL (ref 0.0–1.2)
Total Protein: 6.4 g/dL — ABNORMAL LOW (ref 6.5–8.1)

## 2024-06-02 LAB — RESP PANEL BY RT-PCR (RSV, FLU A&B, COVID)  RVPGX2
Influenza A by PCR: NEGATIVE
Influenza B by PCR: NEGATIVE
Resp Syncytial Virus by PCR: NEGATIVE
SARS Coronavirus 2 by RT PCR: NEGATIVE

## 2024-06-02 LAB — PROCALCITONIN: Procalcitonin: 2.14 ng/mL

## 2024-06-02 LAB — TROPONIN I (HIGH SENSITIVITY)
Troponin I (High Sensitivity): 4 ng/L (ref <18)
Troponin I (High Sensitivity): 4 ng/L (ref <18)

## 2024-06-02 LAB — BRAIN NATRIURETIC PEPTIDE: B Natriuretic Peptide: 43 pg/mL (ref 0.0–100.0)

## 2024-06-02 LAB — LACTIC ACID, PLASMA
Lactic Acid, Venous: 1.9 mmol/L (ref 0.5–1.9)
Lactic Acid, Venous: 2.4 mmol/L (ref 0.5–1.9)

## 2024-06-02 MED ORDER — ACETAMINOPHEN 500 MG PO TABS
1000.0000 mg | ORAL_TABLET | Freq: Once | ORAL | Status: AC
  Administered 2024-06-02: 1000 mg via ORAL
  Filled 2024-06-02: qty 2

## 2024-06-02 MED ORDER — RIVAROXABAN 20 MG PO TABS
20.0000 mg | ORAL_TABLET | Freq: Every day | ORAL | Status: DC
  Administered 2024-06-02 – 2024-06-04 (×3): 20 mg via ORAL
  Filled 2024-06-02 (×3): qty 1

## 2024-06-02 MED ORDER — FAMOTIDINE 20 MG PO TABS
20.0000 mg | ORAL_TABLET | Freq: Every day | ORAL | Status: DC
  Administered 2024-06-02 – 2024-06-04 (×3): 20 mg via ORAL
  Filled 2024-06-02 (×3): qty 1

## 2024-06-02 MED ORDER — SODIUM CHLORIDE 0.9 % IV SOLN
1.0000 g | Freq: Once | INTRAVENOUS | Status: AC
  Administered 2024-06-02: 1 g via INTRAVENOUS
  Filled 2024-06-02: qty 10

## 2024-06-02 MED ORDER — LACTATED RINGERS IV BOLUS
500.0000 mL | Freq: Once | INTRAVENOUS | Status: AC
  Administered 2024-06-02: 500 mL via INTRAVENOUS

## 2024-06-02 MED ORDER — HYDRALAZINE HCL 20 MG/ML IJ SOLN: 5.0000 mg | INTRAMUSCULAR | Status: DC | PRN

## 2024-06-02 MED ORDER — LACTATED RINGERS IV BOLUS
1000.0000 mL | Freq: Once | INTRAVENOUS | Status: AC
  Administered 2024-06-02: 1000 mL via INTRAVENOUS

## 2024-06-02 MED ORDER — SODIUM CHLORIDE 0.9 % IV SOLN
2.0000 g | INTRAVENOUS | Status: DC
  Administered 2024-06-03 – 2024-06-04 (×2): 2 g via INTRAVENOUS
  Filled 2024-06-02 (×2): qty 20

## 2024-06-02 MED ORDER — HYDROMORPHONE HCL 2 MG PO TABS
2.0000 mg | ORAL_TABLET | Freq: Four times a day (QID) | ORAL | Status: DC | PRN
  Administered 2024-06-03 – 2024-06-05 (×6): 2 mg via ORAL
  Filled 2024-06-02 (×6): qty 1

## 2024-06-02 MED ORDER — DIPHENHYDRAMINE HCL 50 MG/ML IJ SOLN
25.0000 mg | Freq: Once | INTRAMUSCULAR | Status: AC
  Administered 2024-06-02: 25 mg via INTRAVENOUS
  Filled 2024-06-02: qty 1

## 2024-06-02 MED ORDER — PANTOPRAZOLE SODIUM 40 MG PO TBEC
40.0000 mg | DELAYED_RELEASE_TABLET | Freq: Every day | ORAL | Status: DC
  Administered 2024-06-03 – 2024-06-05 (×3): 40 mg via ORAL
  Filled 2024-06-02 (×3): qty 1

## 2024-06-02 MED ORDER — CYCLOBENZAPRINE HCL 10 MG PO TABS: 10.0000 mg | ORAL_TABLET | Freq: Once | ORAL | Status: DC

## 2024-06-02 MED ORDER — HYDROMORPHONE HCL 1 MG/ML IJ SOLN
0.5000 mg | Freq: Once | INTRAMUSCULAR | Status: AC
  Administered 2024-06-02: 0.5 mg via INTRAVENOUS
  Filled 2024-06-02: qty 0.5

## 2024-06-02 MED ORDER — NALOXONE HCL 4 MG/0.1ML NA LIQD: 1.0000 | Freq: Once | NASAL | Status: DC | PRN

## 2024-06-02 MED ORDER — TIZANIDINE HCL 2 MG PO TABS
2.0000 mg | ORAL_TABLET | Freq: Two times a day (BID) | ORAL | Status: DC | PRN
  Administered 2024-06-02: 2 mg via ORAL
  Filled 2024-06-02 (×2): qty 1

## 2024-06-02 MED ORDER — IPRATROPIUM-ALBUTEROL 0.5-2.5 (3) MG/3ML IN SOLN
3.0000 mL | Freq: Once | RESPIRATORY_TRACT | Status: AC
  Administered 2024-06-02: 3 mL via RESPIRATORY_TRACT
  Filled 2024-06-02: qty 3

## 2024-06-02 MED ORDER — ACETAMINOPHEN 325 MG PO TABS: 650.0000 mg | ORAL_TABLET | Freq: Four times a day (QID) | ORAL | Status: DC | PRN

## 2024-06-02 MED ORDER — IBUPROFEN 400 MG PO TABS: 600.0000 mg | ORAL_TABLET | Freq: Once | ORAL | Status: DC

## 2024-06-02 MED ORDER — METOCLOPRAMIDE HCL 5 MG/ML IJ SOLN
10.0000 mg | Freq: Once | INTRAMUSCULAR | Status: AC
  Administered 2024-06-02: 10 mg via INTRAVENOUS
  Filled 2024-06-02: qty 2

## 2024-06-02 MED ORDER — ACETAMINOPHEN 650 MG RE SUPP
650.0000 mg | Freq: Four times a day (QID) | RECTAL | Status: DC | PRN
Start: 2024-06-02 — End: 2024-06-05

## 2024-06-02 MED ORDER — SODIUM CHLORIDE 0.9 % IV SOLN
500.0000 mg | INTRAVENOUS | Status: DC
  Administered 2024-06-02 – 2024-06-04 (×3): 500 mg via INTRAVENOUS
  Filled 2024-06-02 (×3): qty 5

## 2024-06-02 MED ORDER — DULOXETINE HCL 60 MG PO CPEP
60.0000 mg | ORAL_CAPSULE | Freq: Every day | ORAL | Status: DC
  Administered 2024-06-02 – 2024-06-04 (×3): 60 mg via ORAL
  Filled 2024-06-02 (×3): qty 1

## 2024-06-02 MED ORDER — GABAPENTIN 400 MG PO CAPS
400.0000 mg | ORAL_CAPSULE | Freq: Two times a day (BID) | ORAL | Status: DC
  Administered 2024-06-02 – 2024-06-05 (×6): 400 mg via ORAL
  Filled 2024-06-02 (×6): qty 1

## 2024-06-02 MED ORDER — ALPRAZOLAM 1 MG PO TABS
1.0000 mg | ORAL_TABLET | Freq: Four times a day (QID) | ORAL | Status: DC | PRN
  Administered 2024-06-02: 1 mg via ORAL
  Filled 2024-06-02 (×2): qty 1

## 2024-06-02 MED ORDER — IOHEXOL 300 MG/ML  SOLN
100.0000 mL | Freq: Once | INTRAMUSCULAR | Status: AC | PRN
  Administered 2024-06-02: 100 mL via INTRAVENOUS

## 2024-06-02 NOTE — ED Notes (Signed)
 Provider at bedside

## 2024-06-02 NOTE — ED Triage Notes (Addendum)
 Pt presents with a productive cough that woke her from her sleep this AM. After coughing for a while she started having a sore throat and chest pain. Pt did note a speck of blood in her sputum. The CP is located mid-sternal pain that is described as a scratchy feeling. Pt did have som ShOB after coughing as well that has since resolved. Pt did take OTC cold medication for the cough and 2 mg dilaudid  for a HA without relief.   Pt placed on 2L O2 during triage.

## 2024-06-02 NOTE — Progress Notes (Signed)
 Assisted patient with instruction on Incentive Spirometry and flutter usage.  Patient was able to achieve on IS X10 and X10 on flutter.  Good effort demonstrated on both.

## 2024-06-02 NOTE — ED Provider Notes (Cosign Needed Addendum)
 [place] EMERGENCY DEPARTMENT AT [facility] Provider Note   CSN: [id number] Arrival date & time: 06/02/24  1321     Patient presents with: Cough and Chest Pain   Norma Anthony is a 68 y.o. female.She has PMH of COPD with 2L oxygen via [place], fibromyalgia, DVT/PE s/p IVC filter and complaint on her Xarelto , sleep apnea, GERD, and diverticulitis.   Presents to the ER for evaluation of shortness of breath chest pain and sore throat with cough, this was noted from sleep this morning.  She she is coughing very hard and she thinks the pain in her chest is from the excessive coughing.  She also noticed a bit of blood in her sputum this morning.  She did take over-the-counter cough medicine without relief she is also her home pain medicine without relief of her chest pain and headache.  She states she has a history of headaches and this is unchanged from previous.  She denies any numbness ting or weakness, headache was not sudden onset that is not worst of her life and there is no change from her usual headache she reports.  She checked her temperature at home and states it was normal.  She reports she had diarrhea several days ago and that has resolved, she does have some mild right pelvic discomfort.  She denies blood in her stools.  Denies dysuria.  She had no dizziness or syncope.    Cough Associated symptoms: chest pain   Chest Pain Associated symptoms: cough        Prior to Admission medications   Medication Sig Start Date End Date Taking? Authorizing Provider  albuterol  (VENTOLIN  HFA) 108 (90 Base) MCG/ACT inhaler Inhale 2 puffs into the lungs every 6 (six) hours as needed for wheezing or shortness of breath. 05/12/21  Yes [provider]  ALPRAZolam  (XANAX ) 1 MG tablet Take 1 tablet (1 mg total) by mouth every 6 (six) hours as needed for anxiety or sleep. 03/16/23  Yes Earnie Gola, PA-C  ascorbic acid  (VITAMIN C ) 1000 MG tablet Take 1,000 mg by mouth daily.   Yes  [provider]  b complex vitamins capsule Take 1 capsule by mouth daily.   Yes [provider]  cephALEXin  (KEFLEX ) 500 MG capsule Take 500 mg by mouth 2 (two) times daily. 05/19/24  Yes [provider]  clotrimazole -betamethasone  (LOTRISONE ) cream Apply 1 Application topically 2 (two) times daily as needed (for irritation under the breasts or abdominal folds).   Yes [provider]  DULoxetine  (CYMBALTA ) 60 MG capsule Take 60 mg by mouth at bedtime. 09/14/18  Yes [provider]  famotidine  (PEPCID ) 20 MG tablet TAKE 1 TABLET BY MOUTH AFTER SUPPER Patient taking differently: Take 20 mg by mouth at bedtime. 04/01/23  Yes Sood, Vineet, MD  Ferrous Fumarate -Folic Acid  324-1 MG TABS Take 1 tablet by mouth daily. Hematinic 02/29/20  Yes [provider]  fluconazole  (DIFLUCAN ) 200 MG tablet Take 200 mg by mouth daily. 06/25/23  Yes [provider]  gabapentin  (NEURONTIN ) 400 MG capsule Take 400 mg by mouth 2 (two) times daily. 08/21/22  Yes [provider]  HYDROmorphone  (DILAUDID ) 2 MG tablet Take 2 mg by mouth 4 (four) times daily as needed for moderate pain (pain score 4-6). 05/29/24  Yes [provider]  naloxone  (NARCAN ) nasal spray 4 mg/0.1 mL Place 1 spray into the nose once as needed (accidental over dose). 10/16/21  Yes [provider]  nutrition supplement, JUVEN, (JUVEN) PACK  Take 1 packet by mouth 2 (two) times daily between meals.   Yes [provider]  ondansetron  (ZOFRAN ) 4 MG tablet Take 1 tablet (4 mg total) by mouth every 8 (eight) hours as needed for nausea or vomiting. 08/31/23 08/30/24 Yes Hill, Philippa Bray, PA-C  pantoprazole  (PROTONIX ) 40 MG tablet Take 40 mg by mouth daily with breakfast. 06/30/16  Yes [provider]  promethazine  (PHENERGAN ) 25 MG tablet Take 25 mg by mouth every 6 (six) hours as needed for nausea or vomiting.   Yes [provider]  tiZANidine  (ZANAFLEX ) 2 MG  tablet Take 2 mg by mouth every 12 (twelve) hours as needed for muscle spasms. 02/29/24  Yes [provider]  XARELTO  20 MG TABS tablet Take 20 mg by mouth daily with supper.   Yes [provider]    Allergies: Belbuca  [buprenorphine  hcl], Pregabalin, Compazine  [prochlorperazine ], Droperidol, and Ergotamine-caffeine    Review of Systems  Respiratory:  Positive for cough.   Cardiovascular:  Positive for chest pain.    Updated Vital Signs BP 116/67   Pulse (!) 130   Temp 99.6 F (37.6 C) (Oral)   Resp 17   Ht 5' 5 (1.651 m)   Wt 102.1 kg   SpO2 (!) 86%   BMI 37.44 kg/m   Physical Exam Vitals and nursing note reviewed.  Constitutional:      General: She is not in acute distress.    Appearance: She is well-developed.  HENT:     Head: Normocephalic and atraumatic.     Mouth/Throat:     Pharynx: Oropharynx is clear. Uvula midline. No oropharyngeal exudate, posterior oropharyngeal erythema or uvula swelling.     Tonsils: No tonsillar exudate or tonsillar abscesses.   Eyes:     General: Lids are normal.     Extraocular Movements: Extraocular movements intact.     Conjunctiva/sclera: Conjunctivae normal.     Pupils: Pupils are equal, round, and reactive to light.    Cardiovascular:     Rate and Rhythm: Normal rate and regular rhythm.     Heart sounds: No murmur heard. Pulmonary:     Effort: Pulmonary effort is normal. No respiratory distress.     Breath sounds: Normal breath sounds.  Abdominal:     Palpations: Abdomen is soft.     Tenderness: There is abdominal tenderness in the right lower quadrant. There is no right CVA tenderness, left CVA tenderness, guarding or rebound.   Musculoskeletal:        General: No swelling.     Cervical back: Neck supple.     Right lower leg: No tenderness. No edema.     Left lower leg: No tenderness. No edema.   Skin:    General: Skin is warm and dry.     Capillary Refill: Capillary refill takes less than 2 seconds.    Neurological:     General: No focal deficit present.     Mental Status: She is alert and oriented to person, place, and time.   Psychiatric:        Mood and Affect: Mood normal.     (all labs ordered are listed, but only abnormal results are displayed) Labs Reviewed  RESP PANEL BY RT-PCR (RSV, FLU A&B, COVID)  [id number]  GROUP A STREP BY PCR  COMPREHENSIVE METABOLIC PANEL WITH GFR  CBC WITH DIFFERENTIAL/PLATELET  BRAIN NATRIURETIC PEPTIDE  TROPONIN I (HIGH SENSITIVITY)  TROPONIN I (HIGH SENSITIVITY)    EKG: None  Radiology: MM 3D DIAGNOSTIC  MAMMOGRAM BILATERAL BREAST Result Date: 06/01/2024 CLINICAL DATA:  68 year old woman with diffuse LEFT breast pain for approximately 8 weeks status post injury while turning in bed. RIGHT annual mammogram. EXAM: DIGITAL DIAGNOSTIC BILATERAL MAMMOGRAM WITH TOMOSYNTHESIS AND CAD; ULTRASOUND LEFT BREAST LIMITED TECHNIQUE: Bilateral digital diagnostic mammography and breast tomosynthesis was performed. The images were evaluated with computer-aided detection. ; Targeted ultrasound examination of the left breast was performed. COMPARISON:  Previous exam(s). ACR Breast Density Category b: There are scattered areas of fibroglandular density. FINDINGS: RIGHT: Mammogram: No suspicious mass, distortion, or microcalcifications are identified to suggest presence of malignancy. LEFT: Mammogram: Obscured oval mass seen in the upper outer LEFT breast at posterior depth. No additional mass, distortion, or microcalcifications. Ultrasound: Targeted sonographic evaluation of the LEFT breast demonstrates an oval hypoechoic mass measuring 4.2 x 2.1 x 3.1 cm at 2 o'clock 12 CMFN, which corresponds to the mammographic mass at posterior depth. IMPRESSION: Probably benign of 4.2 cm LEFT breast mass (2 o'clock 12 CMFN) corresponding to the mammographic mass at posterior depth, favored to be a hematoma. RECOMMENDATION: LEFT breast ultrasound in 6 months. I have discussed the  findings and recommendations with the patient. If applicable, a reminder letter will be sent to the patient regarding the next appointment. BI-RADS CATEGORY  3: Probably benign. Electronically Signed   By: Elester Grim M.D.   On: 06/01/2024 16:46   US  LIMITED ULTRASOUND INCLUDING AXILLA LEFT BREAST  Result Date: 06/01/2024 CLINICAL DATA:  68 year old woman with diffuse LEFT breast pain for approximately 8 weeks status post injury while turning in bed. RIGHT annual mammogram. EXAM: DIGITAL DIAGNOSTIC BILATERAL MAMMOGRAM WITH TOMOSYNTHESIS AND CAD; ULTRASOUND LEFT BREAST LIMITED TECHNIQUE: Bilateral digital diagnostic mammography and breast tomosynthesis was performed. The images were evaluated with computer-aided detection. ; Targeted ultrasound examination of the left breast was performed. COMPARISON:  Previous exam(s). ACR Breast Density Category b: There are scattered areas of fibroglandular density. FINDINGS: RIGHT: Mammogram: No suspicious mass, distortion, or microcalcifications are identified to suggest presence of malignancy. LEFT: Mammogram: Obscured oval mass seen in the upper outer LEFT breast at posterior depth. No additional mass, distortion, or microcalcifications. Ultrasound: Targeted sonographic evaluation of the LEFT breast demonstrates an oval hypoechoic mass measuring 4.2 x 2.1 x 3.1 cm at 2 o'clock 12 CMFN, which corresponds to the mammographic mass at posterior depth. IMPRESSION: Probably benign of 4.2 cm LEFT breast mass (2 o'clock 12 CMFN) corresponding to the mammographic mass at posterior depth, favored to be a hematoma. RECOMMENDATION: LEFT breast ultrasound in 6 months. I have discussed the findings and recommendations with the patient. If applicable, a reminder letter will be sent to the patient regarding the next appointment. BI-RADS CATEGORY  3: Probably benign. Electronically Signed   By: Elester Grim M.D.   On: 06/01/2024 16:46     .Critical Care  Performed by: Aimee Houseman, PA-C Authorized by: Aimee Houseman, PA-C   Critical care provider statement:    Critical care time (minutes):  30   Critical care was necessary to treat or prevent imminent or life-threatening deterioration of the following conditions:  Sepsis   Critical care was time spent personally by me on the following activities:  Development of treatment plan with patient or surrogate, discussions with consultants, evaluation of patient's response to treatment, examination of patient, ordering and review of laboratory studies, ordering and review of radiographic studies, ordering and performing treatments and interventions, pulse oximetry, re-evaluation of patient's condition, review of old charts and obtaining history from  patient or surrogate   I assumed direction of critical care for this patient from another provider in my specialty: no     Care discussed with: admitting provider      Medications Ordered in the ED  lactated ringers  bolus 1,000 mL (has no administration in time range)  ipratropium-albuterol  (DUONEB) 0.5-2.5 (3) MG/3ML nebulizer solution 3 mL (has no administration in time range)  metoCLOPramide  (REGLAN ) injection 10 mg (10 mg Intravenous Given 06/02/24 1452)  diphenhydrAMINE  (BENADRYL ) injection 25 mg (25 mg Intravenous Given 06/02/24 1452)                                    Medical Decision Making This patient presents to the ED for concern of cough with blood in sputum, chest pain, shortness of breath and headache that started this morning, this involves an extensive number of treatment options, and is a complaint that carries with it a high risk of complications and morbidity.  The differential diagnosis includes ACS, pneumonia, PE, COPD exacerbation, less likely dissection or pneumothorax, viral illness, sepsis, other   Co morbidities that complicate the patient evaluation :   Diverticulitis, PE, chronic anticoagulant use, COPD home oxygen   Additional history  obtained:  Additional history obtained from EMR External records from outside source obtained and reviewed including prior notes, labs, imaging   Lab Tests:  I Ordered, and personally interpreted labs.  The pertinent results include: CBC with leukocytosis, white blood cell count 30 with a left shift, CMP with normal renal function and normal LFTs, troponin negative, BNP normal, lactic acid normal, UA still pending at this time   Imaging Studies ordered:  I ordered imaging studies including chest x-ray which shows left basilar infiltrate I independently visualized and interpreted imaging within scope of identifying emergent findings  I agree with the radiologist interpretation   Cardiac Monitoring: / EKG:  The patient was maintained on a cardiac monitor.  I personally viewed and interpreted the cardiac monitored which showed an underlying rhythm of: Sinus tachycardia   Consultations Obtained:  I requested consultation with the hospitalist, Dr. Osborne Blazer.  He evaluated the patient, due to the significant elevation of her white blood cell count and recent diverticulitis, along with some abdominal pain he wants CT abdomen pelvis to rule out intra-abdominal pathology as a source of her sepsis.   Problem List / ED Course / Critical interventions / Medication management  Chest pain shortness of breath-patient noted tachycardic, having cough with hemoptysis x 1 at home but no further hemoptysis here.  Saturating 93 to 95% on her home oxygen.  Chest x-ray shows infiltrate.  Labs are significant for a white blood cell count of 30.  In conjunction with her cough and infiltrate on x-ray started empiric treatment for pneumonia and consult with hospitalist.  He is concerned about how high her white blood cell count is, she is having some mild right lower quadrant tenderness on exam and has history of diverticulitis and had abnormal CT scan several months ago when she had diverticulitis he would like  to repeat CT abdomen pelvis to rule out further causes of infection causing patient's significant leukocytosis. I ordered medication including Reglan  and Benadryl  for headache Reevaluation of the patient after these medicines showed that the patient improved I have reviewed the patients home medicines and have made adjustments as needed   Social Determinants of Health:  Patient lives at home  Pending CT read, signed out to [employer], PA-C    Amount and/or Complexity of Data Reviewed Labs: ordered. Radiology: ordered.  Risk OTC drugs. Prescription drug management.        Final diagnoses:  None    ED Discharge Orders     None          Joshua Nieves 06/02/24 1934    Joshua Nieves 06/02/24 1936

## 2024-06-02 NOTE — ED Provider Notes (Cosign Needed)
   Patient signed out to me by Norma Mill, PA-C pending CT of the abdomen and pelvis  Patient was seen here for shortness of breath chest pain sore throat cough.  Her workup revealed likely pneumonia with left basilar infiltrate she was noted to have significant leukocytosis along with abdominal pain and history of diverticulitis, so CT of the abdomen and pelvis was ordered.  Infiltrate seen on chest x-ray. IV fluids and Rocephin  Zithromax  given here   [facility] hospitalist was consulted and agrees to admit     CT ABDOMEN PELVIS W CONTRAST Result Date: 06/02/2024 CLINICAL DATA:  Abdominal pain for 2 months. EXAM: CT ABDOMEN AND PELVIS WITH CONTRAST TECHNIQUE: Multidetector CT imaging of the abdomen and pelvis was performed using the standard protocol following bolus administration of intravenous contrast. RADIATION DOSE REDUCTION: This exam was performed according to the departmental dose-optimization program which includes automated exposure control, adjustment of the mA and/or kV according to patient size and/or use of iterative reconstruction technique. CONTRAST:  OMNIPAQUE  IOHEXOL  300 MG/ML  SOLN COMPARISON:  04/26/2024. FINDINGS: Lower chest: Bibasilar consolidation or volume loss. Large hiatal hernia. No pleural or pericardial effusions. Hepatobiliary: Fatty infiltration liver. No focal hepatic lesions. No biliary ductal dilatation. Unremarkable gallbladder. Pancreas: Unremarkable. No pancreatic ductal dilatation or surrounding inflammatory changes. Spleen: Normal in size without focal abnormality. Adrenals/Urinary Tract: No adrenal lesions. There are a couple subcentimeter cyst left kidney. No nephrolithiasis or hydronephrosis. Unremarkable urinary bladder. Stomach/Bowel: Postop changes in the stomach. No bowel dilatation to suggest obstruction. Appendix appears to have been removed. Vascular/Lymphatic: IVC filter. Small filling defect in the IVC distal to the filter consistent with  thrombus. No enlarged abdominal or pelvic lymph nodes. Reproductive: Status post hysterectomy. No adnexal masses. Other: No abdominal wall hernia. There are postop changes in the anterior abdominal wall consistent with previous hernia repair. No abdominopelvic ascites. Musculoskeletal: Thoracolumbosacral degenerative changes. IMPRESSION: 1. Bibasilar consolidation or volume loss. 2. Large hiatal hernia. 3. Fatty infiltration of the liver. 4. IVC filter with small amount of thrombus distal to the filter. 5. No acute abdominal or pelvic pathology identified. Electronically Signed   By: Sydell Eva M.D.   On: 06/02/2024 20:01   DG Chest 2 View Result Date: 06/02/2024 CLINICAL DATA:  Productive cough with shortness of breath and sore throat. EXAM: CHEST - 2 VIEW COMPARISON:  March 29, 2024 FINDINGS: The heart size and mediastinal contours are within normal limits. Mild left basilar infiltrate is noted. No pleural effusion or pneumothorax is identified. Small radiopaque surgical coils are seen overlying the upper abdomen. A chronic deformity of the right humeral head and neck is noted. Multilevel degenerative changes are seen throughout the thoracic spine. IMPRESSION: Mild left basilar infiltrate. Electronically Signed   By: Virgle Grime M.D.   On: 06/02/2024 17:07       Catherne Clubs, PA-C 06/02/24 2015

## 2024-06-02 NOTE — ED Notes (Signed)
 Patient transported to CT

## 2024-06-02 NOTE — ED Notes (Signed)
 Family in room with food

## 2024-06-02 NOTE — ED Notes (Signed)
 Patient transported to X-ray

## 2024-06-02 NOTE — H&P (Addendum)
 [facility] H&P   Patient Demographics:    Norma Anthony, is a 68 y.o. female  MRN: [id number]   DOB - 17-Jan-1956  Admit Date - 06/02/2024  Outpatient Primary MD for the patient is Glynis Lass, MD  Referring MD/NP/PA: PA Celeste  Patient coming from: Home  Chief Complaint  Patient presents with   Cough   Chest Pain      HPI:    Norma Anthony  is a 68 y.o. 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  She is currently back on Xarelto . - Patient presents to ED secondary to complaints of cough, dyspnea, congestion, generalized illness and fatigue, as well she does report abdominal pain, chronic, and diarrhea. - In ED her workup was significant for elevated white blood cell count to 30K, she is currently requiring up to 4 L oxygen from baseline 2, chest x-ray significant for left lung opacity concerning for pneumonia, [facility] consulted to admit.    Review of systems:     A full 10 point Review of Systems was done, except as stated above, all other Review of Systems were negative.   With Past History of the following :    Past Medical History:  Diagnosis Date   Acute respiratory failure with hypoxia (HCC) 03/23/2022   Acute respiratory failure with hypoxia (HCC) 03/23/2022   Anemia    Anxiety    Arthritis    CAD (coronary artery disease)    pt deneies   COPD (chronic obstructive pulmonary disease) (HCC)    Depression    Dyspnea    Fibromyalgia     Generalized anxiety disorder 12/11/2022   GERD (gastroesophageal reflux disease)    Hiatal hernia    History of kidney stones    Lung nodule 03/24/2022   Migraine headache    Multinodular goiter    Nephrolithiasis    Obstructive sleep apnea syndrome 07/01/2021   Opioid dependence (HCC) 03/23/2022   Pneumonia    Pulmonary embolism (HCC) 1996   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   Sleep apnea    Thyroid  nodule       Past Surgical History:  Procedure Laterality Date   ABDOMINAL HYSTERECTOMY     BIOPSY THYROID      EXCISIONAL TOTAL KNEE ARTHROPLASTY WITH ANTIBIOTIC SPACERS Left 03/10/2023   Procedure: Irrigation and debridement left knee, resection total knee arthroplasty, antibiotic spacer placement;  Surgeon: Adonica Hoose, MD;  Location: [facility];  Service: Orthopedics;  Laterality: Left;   HERNIA REPAIR     I & D EXTREMITY Left 12/11/2022   Procedure: IRRIGATION AND DEBRIDEMENT EXTREMITY;  Surgeon: Janeth Medicus, MD;  Location: [facility];  Service: Orthopedics;  Laterality: Left;   IVC FILTER INSERTION N/A 09/28/2022   Procedure: IVC FILTER INSERTION;  Surgeon: Adine Hoof, MD;  Location: [facility] INVASIVE CV LAB;  Service: Cardiovascular;  Laterality: N/A;   IVC FILTER INSERTION N/A 08/09/2023   Procedure: IVC FILTER INSERTION;  Surgeon: Adine Hoof, MD;  Location: [facility] INVASIVE CV LAB;  Service: Cardiovascular;  Laterality: N/A;   IVC FILTER REMOVAL N/A 01/18/2023   Procedure: IVC FILTER REMOVAL;  Surgeon: Adine Hoof, MD;  Location: [facility] INVASIVE CV LAB;  Service: Cardiovascular;  Laterality: N/A;   KIDNEY STONE SURGERY     KNEE ARTHROTOMY Left 02/03/2023   Procedure: KNEE ARTHROTOMY WITH SOFT TISSUE REPAIR;  Surgeon: Adonica Hoose, MD;  Location: [facility];  Service: Orthopedics;  Laterality: Left;  75    LAPAROSCOPIC NISSEN FUNDOPLICATION     PAROTIDECTOMY     REMOVAL OF CEMENTED SPACER KNEE Left 08/25/2023   Procedure: Knee spacer removal;  Surgeon: Adonica Hoose, MD;  Location: [facility];  Service: Orthopedics;  Laterality: Left;  350   TONSILLECTOMY     TOTAL KNEE ARTHROPLASTY Left 11/04/2022   Procedure: TOTAL KNEE ARTHROPLASTY;  Surgeon: Orvan Blanch, MD;  Location: [facility];  Service: Orthopedics;  Laterality: Left;   TOTAL KNEE REVISION Left 08/25/2023   Procedure: TOTAL KNEE REVISION;  Surgeon: Adonica Hoose, MD;  Location: [facility];  Service: Orthopedics;  Laterality: Left;  350      Social History:     Social History   Tobacco Use   Smoking status: Former    Current packs/day: 0.00    Average packs/day: 0.5 packs/day for 10.0 years (5.0 ttl pk-yrs)    Types: Cigarettes    Start date: 02/13/2011    Quit date: 02/12/2021    Years since quitting: 3.3   Smokeless tobacco: Never  Substance Use Topics   Alcohol  use: Not Currently        Family History :     Family History  Problem Relation Age of Onset   Hypertension Mother      Home Medications:   Prior to Admission medications   Medication Sig Start Date End Date Taking? Authorizing Provider  albuterol  (VENTOLIN  HFA) 108 (90 Base) MCG/ACT inhaler Inhale 2 puffs into the lungs every 6 (six) hours as needed for wheezing or shortness of breath. 05/12/21  Yes [provider]  ALPRAZolam  (XANAX ) 1 MG tablet Take 1 tablet (1 mg total) by mouth every 6 (six) hours as needed for anxiety or sleep. 03/16/23  Yes Earnie Gola, PA-C  ascorbic acid  (VITAMIN C ) 1000 MG tablet Take 1,000 mg by mouth daily.   Yes [provider]  b complex vitamins capsule Take 1 capsule by mouth daily.   Yes [provider]  cephALEXin  (KEFLEX ) 500 MG capsule Take 500 mg by mouth 2 (two) times daily. 05/19/24  Yes [provider]  clotrimazole -betamethasone  (LOTRISONE ) cream Apply 1 Application topically 2 (two)  times daily as needed (for irritation under the breasts or abdominal folds).   Yes [provider]  DULoxetine  (CYMBALTA ) 60 MG capsule Take 60 mg by mouth at bedtime. 09/14/18  Yes [provider]  famotidine  (PEPCID ) 20 MG tablet TAKE 1 TABLET BY MOUTH AFTER SUPPER Patient  taking differently: Take 20 mg by mouth at bedtime. 04/01/23  Yes Sood, Vineet, MD  Ferrous Fumarate -Folic Acid  324-1 MG TABS Take 1 tablet by mouth daily. Hematinic 02/29/20  Yes [provider]  fluconazole  (DIFLUCAN ) 200 MG tablet Take 200 mg by mouth daily. 06/25/23  Yes [provider]  gabapentin  (NEURONTIN ) 400 MG capsule Take 400 mg by mouth 2 (two) times daily. 08/21/22  Yes [provider]  HYDROmorphone  (DILAUDID ) 2 MG tablet Take 2 mg by mouth 4 (four) times daily as needed for moderate pain (pain score 4-6). 05/29/24  Yes [provider]  naloxone  (NARCAN ) nasal spray 4 mg/0.1 mL Place 1 spray into the nose once as needed (accidental over dose). 10/16/21  Yes [provider]  nutrition supplement, JUVEN, (JUVEN) PACK Take 1 packet by mouth 2 (two) times daily between meals.   Yes [provider]  ondansetron  (ZOFRAN ) 4 MG tablet Take 1 tablet (4 mg total) by mouth every 8 (eight) hours as needed for nausea or vomiting. 08/31/23 08/30/24 Yes Hill, Philippa Bray, PA-C  pantoprazole  (PROTONIX ) 40 MG tablet Take 40 mg by mouth daily with breakfast. 06/30/16  Yes [provider]  promethazine  (PHENERGAN ) 25 MG tablet Take 25 mg by mouth every 6 (six) hours as needed for nausea or vomiting.   Yes [provider]  tiZANidine  (ZANAFLEX ) 2 MG tablet Take 2 mg by mouth every 12 (twelve) hours as needed for muscle spasms. 02/29/24  Yes [provider]  XARELTO  20 MG TABS tablet Take 20 mg by mouth daily with supper.   Yes [provider]     Allergies:     Allergies  Allergen Reactions   Belbuca  [Buprenorphine  Hcl] Other (See  Comments)     Horrible headache    Pregabalin Other (See Comments)    Paralysis present (finding)- not tolerated Pt stated she could not move her body but she could think   Compazine  [Prochlorperazine ] Anxiety   Droperidol Anxiety   Ergotamine-Caffeine Anxiety and Hypertension    Increased systolic arterial pressure (finding)      Physical Exam:   Vitals  Blood pressure 109/66, pulse (!) 108, temperature 98.9 F (37.2 C), temperature source Oral, resp. rate 13, height 5' 5 (1.651 m), weight 102.1 kg, SpO2 95%.   1. General Frail, deconditioned female, laying in bed in mild discomfort  2. Normal affect and insight, Not Suicidal or Homicidal, Awake Alert, Oriented X 3.  3. No F.N deficits, ALL C.Nerves Intact, Strength 5/5 all 4 extremities, Sensation intact all 4 extremities, Plantars down going.  4. Ears and Eyes appear Normal, Conjunctivae clear, PERRLA. Moist Oral Mucosa.  5. Supple Neck, No JVD, No cervical lymphadenopathy appriciated, No Carotid Bruits.  6. Symmetrical Chest wall movement, lungs clear to auscultation, has scattered Rales and rhonchi at the bases  7. RRR, No Gallops, Rubs or Murmurs, No Parasternal Heave.  8. Positive Bowel Sounds, Abdomen Soft, has right upper quadrant tenderness, epigastric tenderness, reports this is at baseline since her diagnosis with diverticulitis.    9.  No Cyanosis, Normal Skin Turgor, No Skin Rash or Bruise.  10. Good muscle tone,  joints appear normal , no effusions, Normal ROM.    Data Review:    CBC Recent Labs  Lab 06/02/24 1354  WBC 30.0*  HGB 15.7*  HCT 49.2*  PLT 170  MCV 98.8  MCH 31.5  MCHC 31.9  RDW 15.0  LYMPHSABS 3.0  MONOABS 1.8*  EOSABS 0.0  BASOSABS 0.0   ------------------------------------------------------------------------------------------------------------------  Chemistries  Recent Labs  Lab 06/02/24 1354  NA 140  K 3.8  CL 102  CO2 28  GLUCOSE 118*  BUN 15  CREATININE 0.74   CALCIUM 8.6*  AST 21  ALT 17  ALKPHOS 104  BILITOT 0.6   ------------------------------------------------------------------------------------------------------------------ estimated creatinine clearance is 80.8 mL/min (by C-G formula based on SCr of 0.74 mg/dL). ------------------------------------------------------------------------------------------------------------------ No results for input(s): TSH, T4TOTAL, T3FREE, THYROIDAB in the last 72 hours.  Invalid input(s): FREET3  Coagulation profile No results for input(s): INR, PROTIME in the last 168 hours. ------------------------------------------------------------------------------------------------------------------- No results for input(s): DDIMER in the last 72 hours. -------------------------------------------------------------------------------------------------------------------  Cardiac Enzymes No results for input(s): CKMB, TROPONINI, MYOGLOBIN in the last 168 hours.  Invalid input(s): CK ------------------------------------------------------------------------------------------------------------------    Component Value Date/Time   BNP 43.0 06/02/2024 1555     ---------------------------------------------------------------------------------------------------------------  Urinalysis    Component Value Date/Time   COLORURINE YELLOW 04/26/2024 2134   APPEARANCEUR CLEAR 04/26/2024 2134   APPEARANCEUR Clear 01/27/2024 1627   LABSPEC <1.005 (L) 04/26/2024 2134   PHURINE 5.5 04/26/2024 2134   GLUCOSEU NEGATIVE 04/26/2024 2134   HGBUR NEGATIVE 04/26/2024 2134   BILIRUBINUR NEGATIVE 04/26/2024 2134   BILIRUBINUR Negative 01/27/2024 1627   KETONESUR NEGATIVE 04/26/2024 2134   PROTEINUR NEGATIVE 04/26/2024 2134   NITRITE NEGATIVE 04/26/2024 2134   LEUKOCYTESUR NEGATIVE 04/26/2024 2134     ----------------------------------------------------------------------------------------------------------------   Imaging Results:    DG Chest 2 View Result Date: 06/02/2024 CLINICAL DATA:  Productive cough with shortness of breath and sore throat. EXAM: CHEST - 2 VIEW COMPARISON:  March 29, 2024 FINDINGS: The heart size and mediastinal contours are within normal limits. Mild left basilar infiltrate is noted. No pleural effusion or pneumothorax is identified. Small radiopaque surgical coils are seen overlying the upper abdomen. A chronic deformity of the right humeral head and neck is noted. Multilevel degenerative changes are seen throughout the thoracic spine. IMPRESSION: Mild left basilar infiltrate. Electronically Signed   By: Virgle Grime M.D.   On: 06/02/2024 17:07   MM 3D DIAGNOSTIC MAMMOGRAM BILATERAL BREAST Result Date: 06/01/2024 CLINICAL DATA:  68 year old woman with diffuse LEFT breast pain for approximately 8 weeks status post injury while turning in bed. RIGHT annual mammogram. EXAM: DIGITAL DIAGNOSTIC BILATERAL MAMMOGRAM WITH TOMOSYNTHESIS AND CAD; ULTRASOUND LEFT BREAST LIMITED TECHNIQUE: Bilateral digital diagnostic mammography and breast tomosynthesis was performed. The images were evaluated with computer-aided detection. ; Targeted ultrasound examination of the left breast was performed. COMPARISON:  Previous exam(s). ACR Breast Density Category b: There are scattered areas of fibroglandular density. FINDINGS: RIGHT: Mammogram: No suspicious mass, distortion, or microcalcifications are identified to suggest presence of malignancy. LEFT: Mammogram: Obscured oval mass seen in the upper outer LEFT breast at posterior depth. No additional mass, distortion, or microcalcifications. Ultrasound: Targeted sonographic evaluation of the LEFT breast demonstrates an oval hypoechoic mass measuring 4.2 x 2.1 x 3.1 cm at 2 o'clock 12 CMFN, which corresponds to the mammographic mass at  posterior depth. IMPRESSION: Probably benign of 4.2 cm LEFT breast mass (2 o'clock 12 CMFN) corresponding to the mammographic mass at posterior depth, favored to be a hematoma. RECOMMENDATION: LEFT breast ultrasound in 6 months. I have discussed the findings and recommendations with the patient. If applicable, a reminder letter will be sent to the patient regarding the next appointment. BI-RADS CATEGORY  3: Probably benign. Electronically Signed   By: Elester Grim M.D.   On: 06/01/2024 16:46   US  LIMITED ULTRASOUND INCLUDING AXILLA LEFT BREAST  Result Date: 06/01/2024  CLINICAL DATA:  68 year old woman with diffuse LEFT breast pain for approximately 8 weeks status post injury while turning in bed. RIGHT annual mammogram. EXAM: DIGITAL DIAGNOSTIC BILATERAL MAMMOGRAM WITH TOMOSYNTHESIS AND CAD; ULTRASOUND LEFT BREAST LIMITED TECHNIQUE: Bilateral digital diagnostic mammography and breast tomosynthesis was performed. The images were evaluated with computer-aided detection. ; Targeted ultrasound examination of the left breast was performed. COMPARISON:  Previous exam(s). ACR Breast Density Category b: There are scattered areas of fibroglandular density. FINDINGS: RIGHT: Mammogram: No suspicious mass, distortion, or microcalcifications are identified to suggest presence of malignancy. LEFT: Mammogram: Obscured oval mass seen in the upper outer LEFT breast at posterior depth. No additional mass, distortion, or microcalcifications. Ultrasound: Targeted sonographic evaluation of the LEFT breast demonstrates an oval hypoechoic mass measuring 4.2 x 2.1 x 3.1 cm at 2 o'clock 12 CMFN, which corresponds to the mammographic mass at posterior depth. IMPRESSION: Probably benign of 4.2 cm LEFT breast mass (2 o'clock 12 CMFN) corresponding to the mammographic mass at posterior depth, favored to be a hematoma. RECOMMENDATION: LEFT breast ultrasound in 6 months. I have discussed the findings and recommendations with the patient. If  applicable, a reminder letter will be sent to the patient regarding the next appointment. BI-RADS CATEGORY  3: Probably benign. Electronically Signed   By: Elester Grim M.D.   On: 06/01/2024 16:46    EKG:  Vent. rate 129 BPM PR interval 144 ms QRS duration 84 ms QT/QTcB 308/451 ms P-R-T axes 43 -15 8 Sinus tachycardia Left ventricular hypertrophy with repolarization abnormality ( R in aVL , Cornell product , Romhilt-Estes ) Possible Lateral infarct , age undetermined Abnormal ECG When compared with ECG of 26-Apr-2024 18:48, PREVIOUS ECG IS PRESENT    Assessment & Plan:    Active Problems:   Chronic respiratory failure with hypoxia (HCC)   COPD (chronic obstructive pulmonary disease) (HCC)   Obstructive sleep apnea syndrome   Chronic deep vein thrombosis (DVT) of distal vein of lower extremity (HCC)   Sepsis , POA due Pneumonia - Presents with cough, congestion, chest x-ray significant for left basilar infiltrate - Admit under pneumonia pathway, encouraged use incentive spirometer, flutter valve, will check Legionella, strep pneumoniae antigen, and sputum culture. - Continue with IV Rocephin  and azithromycin  - Follow on sputum cultures  Abdominal pain and vomiting due to acute diverticulitis Continue IV hydration and add bolus today Patient was treated with IV ceftriaxone  and metronidazole , we shall continue same at this time  Continue IV Dilaudid  0.5 mg every 3 hours as needed for moderate to severe pain Continue IV Compazine  p.r.n. for nausea or vomiting Advance to soft diet Monitor CBC with worsening leukocytosis noted   Acute on chronic respiratory failure with hypoxia -Baseline 2 L nasal cannula intermittently, currently on continuous 4 L oxygen, please encourage use incentive spirometer, flutter valve and treatment pneumonia  COPD (not in exacerbation) Continue albuterol  per home regimen   History of pulmonary embolus (PE)/DVT -Post IVC filter. - Continue with  Xarelto  for anticoagulation, even though the IVC, she high risk for recurrent DVT as she had near occlusive DVT with holding her Xarelto  recently, so should continue indefinitely.   GERD without esophagitis Continue home regimen of pantoprazole  and famotidine    Generalized anxiety disorder Continue Xanax  per home regimen   Obesity class II -Body mass index is 37.44 kg/m.  Chronic pain syndrome - Resume home regimen    DVT Prophylaxis on Xarelto   AM Labs Ordered, also please review Full Orders  Family Communication: Admission, patients  condition and plan of care including tests being ordered have been discussed with the patient and husband who indicate understanding and agree with the plan and Code Status.  Code Status Full  Likely DC to  home  Condition GUARDED    Consults called: none    Admission status: inpatient    Time spent in minutes : 70 minutes   Seena Dadds M.D on 06/02/2024 at 7:00 PM   [facility] - Office  [phone]

## 2024-06-03 LAB — BASIC METABOLIC PANEL WITH GFR
Anion gap: 7 (ref 5–15)
BUN: 13 mg/dL (ref 8–23)
CO2: 28 mmol/L (ref 22–32)
Calcium: 8 mg/dL — ABNORMAL LOW (ref 8.9–10.3)
Chloride: 107 mmol/L (ref 98–111)
Creatinine, Ser: 0.6 mg/dL (ref 0.44–1.00)
GFR, Estimated: >60 mL/min (ref >60)
Glucose, Bld: 113 mg/dL — ABNORMAL HIGH (ref 70–99)
Potassium: 3.5 mmol/L (ref 3.5–5.1)
Sodium: 142 mmol/L (ref 135–145)

## 2024-06-03 LAB — CBC
HCT: 40.5 % (ref 36.0–46.0)
Hemoglobin: 12.3 g/dL (ref 12.0–15.0)
MCH: 30.4 pg (ref 26.0–34.0)
MCHC: 30.4 g/dL (ref 30.0–36.0)
MCV: 100 fL (ref 80.0–100.0)
Platelets: 137 10*3/uL — ABNORMAL LOW (ref 150–400)
RBC: 4.05 MIL/uL (ref 3.87–5.11)
RDW: 15.2 % (ref 11.5–15.5)
WBC: 19.8 10*3/uL — ABNORMAL HIGH (ref 4.0–10.5)
nRBC: 0 % (ref 0.0–0.2)

## 2024-06-03 LAB — LACTIC ACID, PLASMA: Lactic Acid, Venous: 2 mmol/L (ref 0.5–1.9)

## 2024-06-03 LAB — STREP PNEUMONIAE URINARY ANTIGEN: Strep Pneumo Urinary Antigen: NEGATIVE

## 2024-06-03 MED ORDER — GUAIFENESIN ER 600 MG PO TB12
1200.0000 mg | ORAL_TABLET | Freq: Two times a day (BID) | ORAL | Status: DC
  Administered 2024-06-03 – 2024-06-05 (×5): 1200 mg via ORAL
  Filled 2024-06-03 (×5): qty 2

## 2024-06-03 MED ORDER — POLYETHYLENE GLYCOL 3350 17 G PO PACK
17.0000 g | PACK | Freq: Every day | ORAL | Status: DC
  Administered 2024-06-03 – 2024-06-05 (×3): 17 g via ORAL
  Filled 2024-06-03 (×3): qty 1

## 2024-06-03 MED ORDER — DIPHENHYDRAMINE HCL 50 MG/ML IJ SOLN
25.0000 mg | Freq: Once | INTRAMUSCULAR | Status: AC
  Administered 2024-06-03: 25 mg via INTRAVENOUS
  Filled 2024-06-03: qty 1

## 2024-06-03 MED ORDER — LACTATED RINGERS IV BOLUS
500.0000 mL | Freq: Once | INTRAVENOUS | Status: AC
  Administered 2024-06-03: 500 mL via INTRAVENOUS

## 2024-06-03 MED ORDER — IPRATROPIUM-ALBUTEROL 0.5-2.5 (3) MG/3ML IN SOLN
3.0000 mL | Freq: Four times a day (QID) | RESPIRATORY_TRACT | Status: DC
  Administered 2024-06-03 – 2024-06-05 (×9): 3 mL via RESPIRATORY_TRACT
  Filled 2024-06-03 (×9): qty 3

## 2024-06-03 MED ORDER — BUDESONIDE 0.25 MG/2ML IN SUSP
0.2500 mg | Freq: Two times a day (BID) | RESPIRATORY_TRACT | Status: DC
  Administered 2024-06-03 – 2024-06-05 (×5): 0.25 mg via RESPIRATORY_TRACT
  Filled 2024-06-03 (×5): qty 2

## 2024-06-03 MED ORDER — METOCLOPRAMIDE HCL 5 MG/ML IJ SOLN
10.0000 mg | Freq: Once | INTRAMUSCULAR | Status: AC
  Administered 2024-06-03: 10 mg via INTRAVENOUS
  Filled 2024-06-03: qty 2

## 2024-06-03 MED ORDER — ARFORMOTEROL TARTRATE 15 MCG/2ML IN NEBU
15.0000 ug | INHALATION_SOLUTION | Freq: Two times a day (BID) | RESPIRATORY_TRACT | Status: DC
  Administered 2024-06-03 – 2024-06-05 (×5): 15 ug via RESPIRATORY_TRACT
  Filled 2024-06-03 (×5): qty 2

## 2024-06-03 NOTE — Plan of Care (Signed)

## 2024-06-03 NOTE — Progress Notes (Signed)
 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  She is currently back on Xarelto .  Patient presents with worsening cough, dyspnea, congestion.  Leukocytosis white blood cell 30K requiring up to 4 L of oxygen chest x-ray for left lower lung opacity.  CT abdomen and pelvis bilateral lower lobe consolidation   Assessment & Plan:   Principal Problem:   Pneumonia Active Problems:   Chronic respiratory failure with hypoxia (HCC)   COPD (chronic obstructive pulmonary disease) (HCC)   Obstructive sleep apnea syndrome   Chronic deep vein thrombosis (DVT) of distal vein of lower extremity (HCC)   1-Sepsis, secondary to PNA: - Patient presents with cough, dyspnea, congestion.  Chest x-ray with left lower lobe infiltrates, tachycardia heart rate 130, hypoxia oxygen saturation 86 on room air, lactic acid 2.4 - Patient received IV fluids. - Continue IV ceftriaxone  and azithromycin  - Continue scheduled nebulizer - Scheduled guaifenesin  - Follow blood cultures -Strept Pneumon antigen negative.  - Legionella antigen: Pending -Sputum culture: Pending.   Acute on chronic Respiratory Failure: In setting of PNA COPD: - Continue DuoNebs -Start Brovana , Pulmicort .  -on Chronic 2 L oxygen at home.   History of PE, DVT - Continue Xarelto   GERD: Continue with pantoprazole  and famotidine   Generalized anxiety disorder: Continue with Xanax   Morbid obesity class II: Need lifestyle modification  Chronic  pain syndrome: Continue with  Dilaudid  2 mg   Estimated body mass index is 38.89 kg/m as calculated from the following:   Height as of this encounter: 5' 5 (1.651 m).   Weight as of this encounter: 106 kg.   DVT prophylaxis: Xarelto  Code Status: Full code Family Communication: Crae discussed with patient Disposition Plan:  Status is: Inpatient Remains inpatient appropriate because: Management of PNA, and Resp failure.     Consultants:  none  Procedures:  none  Antimicrobials:  Ceftriaxone  Azithromycin    Subjective: She is alert, report feeling a little better. Still coughing, SOB   Objective: Vitals:   06/02/24 2058 06/02/24 2105 06/02/24 2300 06/03/24 0530  BP:  (!) 111/45 (!) 94/55 (!) 96/56  Pulse:  98 82 80  Resp:  17  17  Temp:  99 F (37.2 C) 97.6 F (36.4 C) 98 F (36.7 C)  TempSrc:  Oral Oral   SpO2:  98% 97% 99%  Weight: 106 kg     Height:        Intake/Output Summary (Last 24 hours) at 06/03/2024 0740 Last data filed at 06/03/2024 0500 Gross per 24 hour  Intake 992.48 ml  Output 300 ml  Net 692.48 ml   Filed Weights   06/02/24 1335 06/02/24 2058  Weight: 102.1 kg 106 kg    Examination:  General exam: Appears calm and comfortable  Respiratory system: . Respiratory effort normal. BL [place].  Cardiovascular system: S1 & S2 heard, RRR.  Gastrointestinal system: Abdomen is nondistended, soft and nontender. No organomegaly or masses felt. Normal bowel sounds heard. Central nervous system: Alert and oriented. No focal neurological deficits. Extremities: Symmetric 5 x 5 power.    Data Reviewed: I have personally reviewed following labs and  imaging studies  CBC: Recent Labs  Lab 06/02/24 1354 06/03/24 0340  WBC 30.0* 19.8*  NEUTROABS 24.6*  --   HGB 15.7* 12.3  HCT 49.2* 40.5  MCV 98.8 100.0  PLT 170 137*   Basic Metabolic Panel: Recent Labs  Lab 06/02/24 1354 06/03/24 0340  NA 140 142  K 3.8 3.5  CL 102 107  CO2 28 28  GLUCOSE 118* 113*  BUN 15 13   CREATININE 0.74 0.60  CALCIUM 8.6* 8.0*   GFR: Estimated Creatinine Clearance: 82.5 mL/min (by C-G formula based on SCr of 0.6 mg/dL). Liver Function Tests: Recent Labs  Lab 06/02/24 1354  AST 21  ALT 17  ALKPHOS 104  BILITOT 0.6  PROT 6.4*  ALBUMIN 2.8*   No results for input(s): LIPASE, AMYLASE in the last 168 hours. No results for input(s): AMMONIA in the last 168 hours. Coagulation Profile: No results for input(s): INR, PROTIME in the last 168 hours. Cardiac Enzymes: No results for input(s): CKTOTAL, CKMB, CKMBINDEX, TROPONINI in the last 168 hours. BNP (last 3 results) No results for input(s): PROBNP in the last 8760 hours. HbA1C: No results for input(s): HGBA1C in the last 72 hours. CBG: No results for input(s): GLUCAP in the last 168 hours. Lipid Profile: No results for input(s): CHOL, HDL, LDLCALC, TRIG, CHOLHDL, LDLDIRECT in the last 72 hours. Thyroid  Function Tests: No results for input(s): TSH, T4TOTAL, FREET4, T3FREE, THYROIDAB in the last 72 hours. Anemia Panel: No results for input(s): VITAMINB12, FOLATE, FERRITIN, TIBC, IRON , RETICCTPCT in the last 72 hours. Sepsis Labs: Recent Labs  Lab 06/02/24 1554 06/02/24 1741 06/02/24 1925 06/02/24 2359  PROCALCITON 2.14  --   --   --   LATICACIDVEN  --  1.9 2.4* 2.0*    Recent Results (from the past 240 hours)  Resp panel by RT-PCR (RSV, Flu A&B, Covid) Throat     Status: None   Collection Time: 06/02/24  2:01 PM   Specimen: Throat; Nasal Swab  Result Value Ref Range Status   SARS Coronavirus 2 by RT PCR NEGATIVE NEGATIVE Final    Comment: (NOTE) SARS-CoV-2 target nucleic acids are NOT DETECTED.  The SARS-CoV-2 RNA is generally detectable in upper respiratory specimens during the acute phase of infection. The lowest concentration of SARS-CoV-2 viral copies this assay can detect is 138 copies/mL. A negative result does not preclude  SARS-Cov-2 infection and should not be used as the sole basis for treatment or other patient management decisions. A negative result may occur with  improper specimen collection/handling, submission of specimen other than nasopharyngeal swab, presence of viral mutation(s) within the areas targeted by this assay, and inadequate number of viral copies(<138 copies/mL). A negative result must be combined with clinical observations, patient history, and epidemiological information. The expected result is Negative.  Fact Sheet for Patients:  BloggerCourse.com  Fact Sheet for Healthcare Providers:  SeriousBroker.it  This test is no t yet approved or cleared by the [place]  FDA and  has been authorized for detection and/or diagnosis of SARS-CoV-2 by FDA under an Emergency Use Authorization (EUA). This EUA will remain  in effect (meaning this test can be used) for the duration of the COVID-19 declaration under Section 564(b)(1) of the Act, 21 U.S.C.section 360bbb-3(b)(1), unless the authorization is terminated  or revoked sooner.       Influenza A by PCR NEGATIVE NEGATIVE Final   Influenza B by PCR NEGATIVE NEGATIVE Final    Comment: (NOTE) The Xpert Xpress SARS-CoV-2/FLU/RSV plus assay is  intended as an aid in the diagnosis of influenza from Nasopharyngeal swab specimens and should not be used as a sole basis for treatment. Nasal washings and aspirates are unacceptable for Xpert Xpress SARS-CoV-2/FLU/RSV testing.  Fact Sheet for Patients: BloggerCourse.com  Fact Sheet for Healthcare Providers: SeriousBroker.it  This test is not yet approved or cleared by the [place]  FDA and has been authorized for detection and/or diagnosis of SARS-CoV-2 by FDA under an Emergency Use Authorization (EUA). This EUA will remain in effect (meaning this test can be used) for the duration of  the COVID-19 declaration under Section 564(b)(1) of the Act, 21 U.S.C. section 360bbb-3(b)(1), unless the authorization is terminated or revoked.     Resp Syncytial Virus by PCR NEGATIVE NEGATIVE Final    Comment: (NOTE) Fact Sheet for Patients: BloggerCourse.com  Fact Sheet for Healthcare Providers: SeriousBroker.it  This test is not yet approved or cleared by the [place]  FDA and has been authorized for detection and/or diagnosis of SARS-CoV-2 by FDA under an Emergency Use Authorization (EUA). This EUA will remain in effect (meaning this test can be used) for the duration of the COVID-19 declaration under Section 564(b)(1) of the Act, 21 U.S.C. section 360bbb-3(b)(1), unless the authorization is terminated or revoked.  Performed at [facility], [address] [place], [place] [address]   Blood culture (routine x 2)     Status: None (Preliminary result)   Collection Time: 06/02/24  5:41 PM   Specimen: Left Antecubital; Blood  Result Value Ref Range Status   Specimen Description LEFT ANTECUBITAL BOTTLES DRAWN AEROBIC ONLY  Final   Special Requests   Final    Blood Culture adequate volume Performed at [facility], [address], [place], [place] [address]    Culture PENDING  Incomplete   Report Status PENDING  Incomplete  Blood culture (routine x 2)     Status: None (Preliminary result)   Collection Time: 06/02/24  5:41 PM   Specimen: BLOOD LEFT HAND  Result Value Ref Range Status   Specimen Description BLOOD LEFT HAND BOTTLES DRAWN AEROBIC ONLY  Final   Special Requests   Final    Blood Culture adequate volume Performed at [facility], [address] [place], [place] [address]    Culture PENDING  Incomplete   Report Status PENDING  Incomplete         Radiology Studies: CT ABDOMEN PELVIS W CONTRAST Result Date: 06/02/2024 CLINICAL DATA:  Abdominal pain for 2 months. EXAM: CT ABDOMEN AND PELVIS WITH CONTRAST  TECHNIQUE: Multidetector CT imaging of the abdomen and pelvis was performed using the standard protocol following bolus administration of intravenous contrast. RADIATION DOSE REDUCTION: This exam was performed according to the departmental dose-optimization program which includes automated exposure control, adjustment of the mA and/or kV according to patient size and/or use of iterative reconstruction technique. CONTRAST:  OMNIPAQUE  IOHEXOL  300 MG/ML  SOLN COMPARISON:  04/26/2024. FINDINGS: Lower chest: Bibasilar consolidation or volume loss. Large hiatal hernia. No pleural or pericardial effusions. Hepatobiliary: Fatty infiltration liver. No focal hepatic lesions. No biliary ductal dilatation. Unremarkable gallbladder. Pancreas: Unremarkable. No pancreatic ductal dilatation or surrounding inflammatory changes. Spleen: Normal in size without focal abnormality. Adrenals/Urinary Tract: No adrenal lesions. There are a couple subcentimeter cyst left kidney. No nephrolithiasis or hydronephrosis. Unremarkable urinary bladder. Stomach/Bowel: Postop changes in the stomach. No bowel dilatation to suggest obstruction. Appendix appears to have been removed. Vascular/Lymphatic: IVC filter. Small filling defect in the IVC distal to the filter consistent with thrombus.  No enlarged abdominal or pelvic lymph nodes. Reproductive: Status post hysterectomy. No adnexal masses. Other: No abdominal wall hernia. There are postop changes in the anterior abdominal wall consistent with previous hernia repair. No abdominopelvic ascites. Musculoskeletal: Thoracolumbosacral degenerative changes. IMPRESSION: 1. Bibasilar consolidation or volume loss. 2. Large hiatal hernia. 3. Fatty infiltration of the liver. 4. IVC filter with small amount of thrombus distal to the filter. 5. No acute abdominal or pelvic pathology identified. Electronically Signed   By: Fonda Field M.D.   On: 06/02/2024 20:01   DG Chest 2 View Result Date:  06/02/2024 CLINICAL DATA:  Productive cough with shortness of breath and sore throat. EXAM: CHEST - 2 VIEW COMPARISON:  March 29, 2024 FINDINGS: The heart size and mediastinal contours are within normal limits. Mild left basilar infiltrate is noted. No pleural effusion or pneumothorax is identified. Small radiopaque surgical coils are seen overlying the upper abdomen. A chronic deformity of the right humeral head and neck is noted. Multilevel degenerative changes are seen throughout the thoracic spine. IMPRESSION: Mild left basilar infiltrate. Electronically Signed   By: Suzen Dials M.D.   On: 06/02/2024 17:07   MM 3D DIAGNOSTIC MAMMOGRAM BILATERAL BREAST Result Date: 06/01/2024 CLINICAL DATA:  68 year old woman with diffuse LEFT breast pain for approximately 8 weeks status post injury while turning in bed. RIGHT annual mammogram. EXAM: DIGITAL DIAGNOSTIC BILATERAL MAMMOGRAM WITH TOMOSYNTHESIS AND CAD; ULTRASOUND LEFT BREAST LIMITED TECHNIQUE: Bilateral digital diagnostic mammography and breast tomosynthesis was performed. The images were evaluated with computer-aided detection. ; Targeted ultrasound examination of the left breast was performed. COMPARISON:  Previous exam(s). ACR Breast Density Category b: There are scattered areas of fibroglandular density. FINDINGS: RIGHT: Mammogram: No suspicious mass, distortion, or microcalcifications are identified to suggest presence of malignancy. LEFT: Mammogram: Obscured oval mass seen in the upper outer LEFT breast at posterior depth. No additional mass, distortion, or microcalcifications. Ultrasound: Targeted sonographic evaluation of the LEFT breast demonstrates an oval hypoechoic mass measuring 4.2 x 2.1 x 3.1 cm at 2 o'clock 12 CMFN, which corresponds to the mammographic mass at posterior depth. IMPRESSION: Probably benign of 4.2 cm LEFT breast mass (2 o'clock 12 CMFN) corresponding to the mammographic mass at posterior depth, favored to be a hematoma.  RECOMMENDATION: LEFT breast ultrasound in 6 months. I have discussed the findings and recommendations with the patient. If applicable, a reminder letter will be sent to the patient regarding the next appointment. BI-RADS CATEGORY  3: Probably benign. Electronically Signed   By: Aliene Lloyd M.D.   On: 06/01/2024 16:46   US  LIMITED ULTRASOUND INCLUDING AXILLA LEFT BREAST  Result Date: 06/01/2024 CLINICAL DATA:  68 year old woman with diffuse LEFT breast pain for approximately 8 weeks status post injury while turning in bed. RIGHT annual mammogram. EXAM: DIGITAL DIAGNOSTIC BILATERAL MAMMOGRAM WITH TOMOSYNTHESIS AND CAD; ULTRASOUND LEFT BREAST LIMITED TECHNIQUE: Bilateral digital diagnostic mammography and breast tomosynthesis was performed. The images were evaluated with computer-aided detection. ; Targeted ultrasound examination of the left breast was performed. COMPARISON:  Previous exam(s). ACR Breast Density Category b: There are scattered areas of fibroglandular density. FINDINGS: RIGHT: Mammogram: No suspicious mass, distortion, or microcalcifications are identified to suggest presence of malignancy. LEFT: Mammogram: Obscured oval mass seen in the upper outer LEFT breast at posterior depth. No additional mass, distortion, or microcalcifications. Ultrasound: Targeted sonographic evaluation of the LEFT breast demonstrates an oval hypoechoic mass measuring 4.2 x 2.1 x 3.1 cm at 2 o'clock 12 CMFN, which corresponds to the mammographic mass at  posterior depth. IMPRESSION: Probably benign of 4.2 cm LEFT breast mass (2 o'clock 12 CMFN) corresponding to the mammographic mass at posterior depth, favored to be a hematoma. RECOMMENDATION: LEFT breast ultrasound in 6 months. I have discussed the findings and recommendations with the patient. If applicable, a reminder letter will be sent to the patient regarding the next appointment. BI-RADS CATEGORY  3: Probably benign. Electronically Signed   By: Aliene Lloyd M.D.   On:  06/01/2024 16:46        Scheduled Meds:  DULoxetine   60 mg Oral QHS   famotidine   20 mg Oral QHS   gabapentin   400 mg Oral BID   pantoprazole   40 mg Oral Q breakfast   rivaroxaban   20 mg Oral Q supper   Continuous Infusions:  azithromycin  Stopped (06/02/24 1952)   cefTRIAXone  (ROCEPHIN )  IV       LOS: 1 day    Time spent: 35 minutes    Rosemary Pentecost A Anala Whisenant, MD [facility]   If 7PM-7AM, please contact night-coverage [email or web address]  06/03/2024, 7:40 AM

## 2024-06-04 ENCOUNTER — Inpatient Hospital Stay (HOSPITAL_COMMUNITY)

## 2024-06-04 DIAGNOSIS — J189 Pneumonia, unspecified organism: Secondary | ICD-10-CM | POA: Diagnosis not present

## 2024-06-04 LAB — BASIC METABOLIC PANEL WITH GFR
Anion gap: 12 (ref 5–15)
BUN: 10 mg/dL (ref 8–23)
CO2: 27 mmol/L (ref 22–32)
Calcium: 8.5 mg/dL — ABNORMAL LOW (ref 8.9–10.3)
Chloride: 102 mmol/L (ref 98–111)
Creatinine, Ser: 0.69 mg/dL (ref 0.44–1.00)
GFR, Estimated: >60 mL/min (ref >60)
Glucose, Bld: 103 mg/dL — ABNORMAL HIGH (ref 70–99)
Potassium: 3.4 mmol/L — ABNORMAL LOW (ref 3.5–5.1)
Sodium: 141 mmol/L (ref 135–145)

## 2024-06-04 LAB — CBC
HCT: 44.2 % (ref 36.0–46.0)
Hemoglobin: 13.4 g/dL (ref 12.0–15.0)
MCH: 31 pg (ref 26.0–34.0)
MCHC: 30.3 g/dL (ref 30.0–36.0)
MCV: 102.3 fL — ABNORMAL HIGH (ref 80.0–100.0)
Platelets: 130 10*3/uL — ABNORMAL LOW (ref 150–400)
RBC: 4.32 MIL/uL (ref 3.87–5.11)
RDW: 15.3 % (ref 11.5–15.5)
WBC: 10.9 10*3/uL — ABNORMAL HIGH (ref 4.0–10.5)
nRBC: 0 % (ref 0.0–0.2)

## 2024-06-04 LAB — LACTIC ACID, PLASMA
Lactic Acid, Venous: 2.9 mmol/L (ref 0.5–1.9)
Lactic Acid, Venous: 1.4 mmol/L (ref 0.5–1.9)
Lactic Acid, Venous: 1.5 mmol/L (ref 0.5–1.9)

## 2024-06-04 MED ORDER — HYDROMORPHONE HCL 1 MG/ML IJ SOLN
1.0000 mg | Freq: Once | INTRAMUSCULAR | Status: AC
  Administered 2024-06-04: 1 mg via INTRAVENOUS
  Filled 2024-06-04: qty 1

## 2024-06-04 MED ORDER — POTASSIUM CHLORIDE CRYS ER 20 MEQ PO TBCR
40.0000 meq | EXTENDED_RELEASE_TABLET | Freq: Once | ORAL | Status: AC
  Administered 2024-06-04: 40 meq via ORAL
  Filled 2024-06-04: qty 2

## 2024-06-04 MED ORDER — DIPHENHYDRAMINE HCL 50 MG/ML IJ SOLN: 25.0000 mg | Freq: Once | INTRAMUSCULAR | Status: DC | PRN

## 2024-06-04 MED ORDER — IOHEXOL 300 MG/ML  SOLN
100.0000 mL | Freq: Once | INTRAMUSCULAR | Status: AC | PRN
  Administered 2024-06-04: 75 mL via INTRAVENOUS

## 2024-06-04 MED ORDER — LACTATED RINGERS IV BOLUS
1000.0000 mL | Freq: Once | INTRAVENOUS | Status: AC
  Administered 2024-06-04: 1000 mL via INTRAVENOUS

## 2024-06-04 MED ORDER — METOCLOPRAMIDE HCL 5 MG/ML IJ SOLN
10.0000 mg | Freq: Once | INTRAMUSCULAR | Status: AC | PRN
Start: 2024-06-04 — End: 2024-06-04
  Administered 2024-06-04: 10 mg via INTRAVENOUS
  Filled 2024-06-04: qty 2

## 2024-06-04 MED ORDER — DIPHENHYDRAMINE HCL 50 MG/ML IJ SOLN
25.0000 mg | Freq: Once | INTRAMUSCULAR | Status: AC | PRN
  Administered 2024-06-04: 25 mg via INTRAVENOUS
  Filled 2024-06-04: qty 1

## 2024-06-04 MED ORDER — METOCLOPRAMIDE HCL 5 MG/ML IJ SOLN: 10.0000 mg | Freq: Once | INTRAMUSCULAR | Status: DC

## 2024-06-04 NOTE — Plan of Care (Signed)

## 2024-06-04 NOTE — Progress Notes (Signed)
 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  She is currently back on Xarelto .  Patient presents with worsening cough, dyspnea, congestion.  Leukocytosis white blood cell 30K requiring up to 4 L of oxygen chest x-ray for left lower lung opacity.  CT abdomen and pelvis bilateral lower lobe consolidation.   Assessment & Plan:   Principal Problem:   Pneumonia Active Problems:   Chronic respiratory failure with hypoxia (HCC)   COPD (chronic obstructive pulmonary disease) (HCC)   Obstructive sleep apnea syndrome   Chronic deep vein thrombosis (DVT) of distal vein of lower extremity (HCC)   1-Sepsis, secondary to PNA: - Patient presents with cough, dyspnea, congestion.  Chest x-ray with left lower lobe infiltrates, tachycardia heart rate 130, hypoxia oxygen saturation 86 on room air, lactic acid 2.4 - Patient received IV fluids. - Continue IV ceftriaxone  and azithromycin  - Continue scheduled nebulizer - Scheduled guaifenesin  - Follow blood cultures: No growth to date.  -Strept Pneumon antigen negative.  - Legionella antigen: Pending -Sputum culture: Pending.  Lactic acid up to 2.9---will give more fluids. WBC trending down. Vitals stable.   Acute on chronic Respiratory Failure: In setting of PNA COPD: - Continue DuoNebs -Continue  Brovana , Pulmicort .  -on Chronic 2 L oxygen at home.   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 Currently on ceftriaxone .  -report some more pain and swelling. Will proceed with CT left Knee.   Estimated body mass index is 38.89 kg/m as calculated from the following:   Height as of this encounter: 5' 5 (1.651 m).   Weight as of this encounter: 106 kg.   DVT prophylaxis: Xarelto  Code Status: Full code Family Communication: Crae discussed with patient Disposition Plan:  Status is: Inpatient Remains inpatient appropriate because: Management of PNA, and Resp failure.     Consultants:  none  Procedures:  none  Antimicrobials:  Ceftriaxone  Azithromycin    Subjective: Report still coughing, some sputum, greenish.  Report left knee pain and more swelling.  Her Orthopedic is Dr Fidel.   Objective: Vitals:   06/04/24 0351 06/04/24 0716 06/04/24 0720 06/04/24 0723  BP: (!) 103/56     Pulse: (!) 103     Resp: 20     Temp: 99 F (37.2 C)     TempSrc: Oral     SpO2: 96% 96% 96% 96%  Weight:      Height:        Intake/Output Summary (Last 24 hours) at 06/04/2024 0745 Last data filed at 06/04/2024 0500 Gross per 24 hour  Intake 960 ml  Output 800 ml  Net 160 ml   Filed Weights   06/02/24 1335 06/02/24 2058  Weight: 102.1 kg 106 kg    Examination:  General exam:NAD Respiratory system: Normal resp effort, BL ronchus.   Cardiovascular system: S 1, S 2 RRR Gastrointestinal system:  BS present, sof,t nt Central nervous system: non focal.  Extremities: left knee with old scar, mild swelling.     Data Reviewed: I have personally reviewed following labs and imaging studies  CBC: Recent Labs  Lab 06/02/24 1354 06/03/24 0340  WBC 30.0* 19.8*  NEUTROABS 24.6*  --   HGB 15.7* 12.3  HCT 49.2* 40.5  MCV 98.8 100.0  PLT 170 137*   Basic Metabolic Panel: Recent Labs  Lab  06/02/24 1354 06/03/24 0340  NA 140 142  K 3.8 3.5  CL 102 107  CO2 28 28  GLUCOSE 118* 113*  BUN 15 13  CREATININE 0.74 0.60  CALCIUM 8.6* 8.0*   GFR: Estimated Creatinine Clearance: 82.5 mL/min (by C-G formula based on SCr of 0.6 mg/dL). Liver Function Tests: Recent Labs  Lab 06/02/24 1354  AST 21  ALT 17  ALKPHOS 104  BILITOT 0.6  PROT 6.4*  ALBUMIN 2.8*   No results for input(s): LIPASE, AMYLASE in the last 168 hours. No results for input(s): AMMONIA in the last 168 hours. Coagulation Profile: No results for input(s): INR, PROTIME in the last 168 hours. Cardiac Enzymes: No results for input(s): CKTOTAL, CKMB, CKMBINDEX, TROPONINI in the last 168 hours. BNP (last 3 results) No results for input(s): PROBNP in the last 8760 hours. HbA1C: No results for input(s): HGBA1C in the last 72 hours. CBG: No results for input(s): GLUCAP in the last 168 hours. Lipid Profile: No results for input(s): CHOL, HDL, LDLCALC, TRIG, CHOLHDL, LDLDIRECT in the last 72 hours. Thyroid  Function Tests: No results for input(s): TSH, T4TOTAL, FREET4, T3FREE, THYROIDAB in the last 72 hours. Anemia Panel: No results for input(s): VITAMINB12, FOLATE, FERRITIN, TIBC, IRON , RETICCTPCT in the last 72 hours. Sepsis Labs: Recent Labs  Lab 06/02/24 1554 06/02/24 1741 06/02/24 1925 06/02/24 2359  PROCALCITON 2.14  --   --   --   LATICACIDVEN  --  1.9 2.4* 2.0*    Recent Results (from the past 240 hours)  Resp panel by RT-PCR (RSV, Flu A&B, Covid) Throat     Status: None   Collection Time: 06/02/24  2:01 PM   Specimen: Throat; Nasal Swab  Result Value Ref Range Status   SARS Coronavirus 2 by RT PCR NEGATIVE NEGATIVE Final    Comment: (NOTE) SARS-CoV-2 target nucleic acids are NOT DETECTED.  The SARS-CoV-2 RNA is generally detectable in upper respiratory specimens during the acute phase of infection. The lowest concentration of  SARS-CoV-2 viral copies this assay can detect is 138 copies/mL. A negative result does not preclude SARS-Cov-2 infection and should not be used as the sole basis for treatment or other patient management decisions. A negative result may occur with  improper specimen collection/handling, submission of specimen other than nasopharyngeal swab, presence of viral mutation(s) within the areas targeted by this assay, and inadequate number of viral copies(<138 copies/mL). A negative result must be combined with clinical observations, patient history, and epidemiological information. The expected result is Negative.  Fact Sheet for Patients:  BloggerCourse.com  Fact Sheet for Healthcare Providers:  SeriousBroker.it  This test is no t yet approved or cleared by the [place]  FDA and  has been authorized for detection and/or diagnosis of SARS-CoV-2 by FDA under an Emergency Use Authorization (EUA). This EUA will remain  in effect (meaning this test can be used) for the duration of the COVID-19 declaration under Section 564(b)(1) of the Act, 21 U.S.C.section 360bbb-3(b)(1), unless the authorization is terminated  or revoked sooner.  Influenza A by PCR NEGATIVE NEGATIVE Final   Influenza B by PCR NEGATIVE NEGATIVE Final    Comment: (NOTE) The Xpert Xpress SARS-CoV-2/FLU/RSV plus assay is intended as an aid in the diagnosis of influenza from Nasopharyngeal swab specimens and should not be used as a sole basis for treatment. Nasal washings and aspirates are unacceptable for Xpert Xpress SARS-CoV-2/FLU/RSV testing.  Fact Sheet for Patients: BloggerCourse.com  Fact Sheet for Healthcare Providers: SeriousBroker.it  This test is not yet approved or cleared by the [place]  FDA and has been authorized for detection and/or diagnosis of SARS-CoV-2 by FDA under an Emergency Use  Authorization (EUA). This EUA will remain in effect (meaning this test can be used) for the duration of the COVID-19 declaration under Section 564(b)(1) of the Act, 21 U.S.C. section 360bbb-3(b)(1), unless the authorization is terminated or revoked.     Resp Syncytial Virus by PCR NEGATIVE NEGATIVE Final    Comment: (NOTE) Fact Sheet for Patients: BloggerCourse.com  Fact Sheet for Healthcare Providers: SeriousBroker.it  This test is not yet approved or cleared by the [place]  FDA and has been authorized for detection and/or diagnosis of SARS-CoV-2 by FDA under an Emergency Use Authorization (EUA). This EUA will remain in effect (meaning this test can be used) for the duration of the COVID-19 declaration under Section 564(b)(1) of the Act, 21 U.S.C. section 360bbb-3(b)(1), unless the authorization is terminated or revoked.  Performed at [facility], [address] [place], [place] [address]   Blood culture (routine x 2)     Status: None (Preliminary result)   Collection Time: 06/02/24  5:41 PM   Specimen: Left Antecubital; Blood  Result Value Ref Range Status   Specimen Description LEFT ANTECUBITAL BOTTLES DRAWN AEROBIC ONLY  Final   Special Requests Blood Culture adequate volume  Final   Culture   Final    NO GROWTH < 24 HOURS Performed at [facility], [address], [place], [place] [address]    Report Status PENDING  Incomplete  Blood culture (routine x 2)     Status: None (Preliminary result)   Collection Time: 06/02/24  5:41 PM   Specimen: BLOOD LEFT HAND  Result Value Ref Range Status   Specimen Description BLOOD LEFT HAND BOTTLES DRAWN AEROBIC ONLY  Final   Special Requests Blood Culture adequate volume  Final   Culture   Final    NO GROWTH < 24 HOURS Performed at [facility], [address], [place], [place] [address]    Report Status PENDING  Incomplete         Radiology Studies: CT ABDOMEN PELVIS W  CONTRAST Result Date: 06/02/2024 CLINICAL DATA:  Abdominal pain for 2 months. EXAM: CT ABDOMEN AND PELVIS WITH CONTRAST TECHNIQUE: Multidetector CT imaging of the abdomen and pelvis was performed using the standard protocol following bolus administration of intravenous contrast. RADIATION DOSE REDUCTION: This exam was performed according to the departmental dose-optimization program which includes automated exposure control, adjustment of the mA and/or kV according to patient size and/or use of iterative reconstruction technique. CONTRAST:  OMNIPAQUE  IOHEXOL  300 MG/ML  SOLN COMPARISON:  04/26/2024. FINDINGS: Lower chest: Bibasilar consolidation or volume loss. Large hiatal hernia. No pleural or pericardial effusions. Hepatobiliary: Fatty infiltration liver. No focal hepatic lesions. No biliary ductal dilatation. Unremarkable gallbladder. Pancreas: Unremarkable. No pancreatic ductal dilatation or surrounding inflammatory changes. Spleen: Normal in size without focal abnormality. Adrenals/Urinary Tract: No adrenal lesions. There are a couple subcentimeter cyst left kidney. No nephrolithiasis or hydronephrosis. Unremarkable  urinary bladder. Stomach/Bowel: Postop changes in the stomach. No bowel dilatation to suggest obstruction. Appendix appears to have been removed. Vascular/Lymphatic: IVC filter. Small filling defect in the IVC distal to the filter consistent with thrombus. No enlarged abdominal or pelvic lymph nodes. Reproductive: Status post hysterectomy. No adnexal masses. Other: No abdominal wall hernia. There are postop changes in the anterior abdominal wall consistent with previous hernia repair. No abdominopelvic ascites. Musculoskeletal: Thoracolumbosacral degenerative changes. IMPRESSION: 1. Bibasilar consolidation or volume loss. 2. Large hiatal hernia. 3. Fatty infiltration of the liver. 4. IVC filter with small amount of thrombus distal to the filter. 5. No acute abdominal or pelvic pathology  identified. Electronically Signed   By: Fonda Field M.D.   On: 06/02/2024 20:01   DG Chest 2 View Result Date: 06/02/2024 CLINICAL DATA:  Productive cough with shortness of breath and sore throat. EXAM: CHEST - 2 VIEW COMPARISON:  March 29, 2024 FINDINGS: The heart size and mediastinal contours are within normal limits. Mild left basilar infiltrate is noted. No pleural effusion or pneumothorax is identified. Small radiopaque surgical coils are seen overlying the upper abdomen. A chronic deformity of the right humeral head and neck is noted. Multilevel degenerative changes are seen throughout the thoracic spine. IMPRESSION: Mild left basilar infiltrate. Electronically Signed   By: Suzen Dials M.D.   On: 06/02/2024 17:07        Scheduled Meds:  arformoterol   15 mcg Nebulization BID   budesonide  (PULMICORT ) nebulizer solution  0.25 mg Nebulization BID   DULoxetine   60 mg Oral QHS   famotidine   20 mg Oral QHS   gabapentin   400 mg Oral BID   guaiFENesin   1,200 mg Oral BID   ipratropium-albuterol   3 mL Nebulization Q6H   pantoprazole   40 mg Oral Q breakfast   polyethylene glycol  17 g Oral Daily   rivaroxaban   20 mg Oral Q supper   Continuous Infusions:  azithromycin  500 mg (06/03/24 1700)   cefTRIAXone  (ROCEPHIN )  IV 2 g (06/03/24 1624)     LOS: 2 days    Time spent: 35 minutes    Chablis Losh A Berlynn Warsame, MD [facility]   If 7PM-7AM, please contact night-coverage [email or web address]  06/04/2024, 7:45 AM

## 2024-06-04 NOTE — Progress Notes (Signed)
 Patient was able to rest this shift.  Patient still has c/o headache at times.  Patient also complained of pain and straining with urination. Urine was a cloudy yellow, but notes at times she does have difficulty/pressure at home with urination.

## 2024-06-04 NOTE — Progress Notes (Signed)
 Date and time results received: 06/04/24 0855  Test: lactic acid   Critical Value: 2.9  Name of Provider Notified: Dr. Madelyne  Orders Received? Or Actions Taken?: awaiting new orders   Damien Silvan, RN

## 2024-06-05 DIAGNOSIS — J189 Pneumonia, unspecified organism: Secondary | ICD-10-CM | POA: Diagnosis not present

## 2024-06-05 LAB — EXPECTORATED SPUTUM ASSESSMENT W GRAM STAIN, RFLX TO RESP C

## 2024-06-05 LAB — BASIC METABOLIC PANEL WITH GFR
Anion gap: 8 (ref 5–15)
BUN: 8 mg/dL (ref 8–23)
CO2: 23 mmol/L (ref 22–32)
Calcium: 7.9 mg/dL — ABNORMAL LOW (ref 8.9–10.3)
Chloride: 103 mmol/L (ref 98–111)
Creatinine, Ser: 0.73 mg/dL (ref 0.44–1.00)
GFR, Estimated: >60 mL/min (ref >60)
Glucose, Bld: 103 mg/dL — ABNORMAL HIGH (ref 70–99)
Potassium: 5.5 mmol/L — ABNORMAL HIGH (ref 3.5–5.1)
Sodium: 134 mmol/L — ABNORMAL LOW (ref 135–145)

## 2024-06-05 LAB — LEGIONELLA PNEUMOPHILA SEROGP 1 UR AG: L. pneumophila Serogp 1 Ur Ag: NEGATIVE

## 2024-06-05 LAB — CBC
HCT: 41.8 % (ref 36.0–46.0)
Hemoglobin: 12.6 g/dL (ref 12.0–15.0)
MCH: 31.4 pg (ref 26.0–34.0)
MCHC: 30.1 g/dL (ref 30.0–36.0)
MCV: 104.2 fL — ABNORMAL HIGH (ref 80.0–100.0)
Platelets: 140 10*3/uL — ABNORMAL LOW (ref 150–400)
RBC: 4.01 MIL/uL (ref 3.87–5.11)
RDW: 15.6 % — ABNORMAL HIGH (ref 11.5–15.5)
WBC: 8.7 10*3/uL (ref 4.0–10.5)
nRBC: 0.6 % — ABNORMAL HIGH (ref 0.0–0.2)

## 2024-06-05 MED ORDER — CEFDINIR 300 MG PO CAPS: 300.0000 mg | ORAL_CAPSULE | Freq: Two times a day (BID) | ORAL | 0 refills | Status: AC

## 2024-06-05 MED ORDER — GUAIFENESIN-DM 100-10 MG/5ML PO SYRP
5.0000 mL | ORAL_SOLUTION | ORAL | Status: DC | PRN
  Administered 2024-06-05: 5 mL via ORAL
  Filled 2024-06-05: qty 5

## 2024-06-05 MED ORDER — GUAIFENESIN ER 600 MG PO TB12: 1200.0000 mg | ORAL_TABLET | Freq: Two times a day (BID) | ORAL | Status: DC

## 2024-06-05 MED ORDER — AZITHROMYCIN 500 MG PO TABS: 500.0000 mg | ORAL_TABLET | Freq: Every day | ORAL | 0 refills | Status: AC

## 2024-06-05 NOTE — Plan of Care (Signed)

## 2024-06-05 NOTE — Discharge Summary (Signed)
 Physician Discharge Summary   Patient: Norma Anthony MRN: [id number] DOB: 1956-03-04  Admit date:     06/02/2024  Discharge date: 06/05/24  Discharge Physician: Bernardino KATHEE Come   PCP: Dreama Mcardle, MD   Recommendations at discharge:   Follow up with orthopedic surgery in next 1-2 weeks with attention to left knee fluid collection.  Repeat BMP and CBC at follow up with PCP in next 1-2 weeks recommended.  Sputum culture results are not available at time of discharge, please check this at follow up.   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  She is currently back on Xarelto .   Patient presents with worsening cough, dyspnea, congestion.  Leukocytosis white blood cell 30K requiring up to 4 L of oxygen chest x-ray for left lower lung opacity.  CT abdomen and pelvis bilateral lower lobe consolidation.  Assessment and Plan: Sepsis, secondary to PNA: Presented with cough, dyspnea, congestion.  Chest x-ray with left lower lobe infiltrates, tachycardia heart rate 130, hypoxia oxygen saturation 86 on room air, lactic acid 2.4. Pneumococcal Ag neg, legionella pending, sputum Cx pending, blood cultures NGTD on day of discharge. Lactic acid up cleared with fluids. WBC trending down. Vitals stable.  - Patient received IV fluids, ceftriaxone  and azithromycin . -  Continue scheduled bronchodilator at home - Scheduled guaifenesin    Acute on chronic Respiratory Failure: In setting of PNA COPD: - Continue DuoNebs -Continue  Brovana , Pulmicort .  -on Chronic 2 L oxygen at home. Ambulated on day of discharge and did well, never desaturated on room air during this per patient. She feels confident in her ability to get inside her home and progress with home health therapies, requesting discharge today. Spouse at bedside in agreement. She does still have the O2 at home and can use as needed and/or qHS.   History of PE, DVT - Continue xarelto    GERD: Continue with pantoprazole  and famotidine    Generalized anxiety disorder: Continue with Xanax    Morbid obesity class II: Body mass index is 38.89 kg/m.    Chronic  pain syndrome: Continue with Dilaudid  2 mg.  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 Currently on ceftriaxone , will restart keflex  after completion of cephalosporin for PNA and follow up with orthopedics. She reports no significant change to the knee on my exam. CT shows fluid collection that is nontender but fluctuant. This is much more likely to be postoperative fluid collection than abscess. Has no focal erythema or warmth or tenderness to it. Urged patient to follow up with her orthopedic surgery team, Dr. Fidel    Thrombocytopenia: Improving, likely due to sepsis.   Macrocytosis: No anemia. Attention at follow up recommended and work up if persistent.   Note elevated potassium level on day of discharge is due to hemolysis. Renal function is robust, and blood draw was exceedingly difficult/delayed, obtained from tiny vein on thumb. Recheck at  follow up recommended.   Consultants: None Procedures performed: None  Disposition: Home Diet recommendation:  Cardiac and Carb modified diet DISCHARGE MEDICATION: Allergies as of 06/05/2024       Reactions   Belbuca  [buprenorphine  Hcl]  Other (See Comments)    Horrible headache    Pregabalin Other (See Comments)   Paralysis present (finding)- not tolerated Pt stated she could not move her body but she could think   Compazine  [prochlorperazine ] Anxiety   Droperidol Anxiety   Ergotamine-caffeine Anxiety, Hypertension   Increased systolic arterial pressure (finding)        Medication List     TAKE these medications    albuterol  108 (90 Base) MCG/ACT inhaler Commonly known as: VENTOLIN  HFA Inhale 2 puffs into the lungs every 6 (six) hours as needed for wheezing or shortness of breath.   ALPRAZolam  1 MG tablet Commonly known as: XANAX  Take 1 tablet (1 mg total) by mouth every 6 (six) hours as needed for anxiety or sleep.   ascorbic acid  1000 MG tablet Commonly known as: VITAMIN C  Take 1,000 mg by mouth daily.   azithromycin  500 MG tablet Commonly known as: ZITHROMAX  Take 1 tablet (500 mg total) by mouth daily for 2 days.   b complex vitamins capsule Take 1 capsule by mouth daily.   cefdinir  300 MG capsule Commonly known as: OMNICEF  Take 1 capsule (300 mg total) by mouth 2 (two) times daily for 2 days.   cephALEXin  500 MG capsule Commonly known as: KEFLEX  Take 500 mg by mouth 2 (two) times daily.   clotrimazole -betamethasone  cream Commonly known as: LOTRISONE  Apply 1 Application topically 2 (two) times daily as needed (for irritation under the breasts or abdominal folds).   DULoxetine  60 MG capsule Commonly known as: CYMBALTA  Take 60 mg by mouth at bedtime.   famotidine  20 MG tablet Commonly known as: PEPCID  TAKE 1 TABLET BY MOUTH AFTER SUPPER What changed:  how much to take how to take this when to take this additional instructions   Ferrous Fumarate -Folic Acid  324-1 MG Tabs Take 1 tablet by mouth daily. Hematinic   fluconazole  200 MG tablet Commonly known as: DIFLUCAN  Take 200 mg by mouth daily.   gabapentin  400 MG capsule Commonly known as: NEURONTIN  Take 400 mg by mouth 2 (two)  times daily.   guaiFENesin  600 MG 12 hr tablet Commonly known as: MUCINEX  Take 2 tablets (1,200 mg total) by mouth 2 (two) times daily.   HYDROmorphone  2 MG tablet Commonly known as: DILAUDID  Take 2 mg by mouth 4 (four) times daily as needed for moderate pain (pain score 4-6).   naloxone  4 MG/0.1ML Liqd nasal spray kit Commonly known as: NARCAN  Place 1 spray into the nose once as needed (accidental over dose).   nutrition supplement (JUVEN) Pack Take 1 packet by mouth 2 (two) times daily between meals.   ondansetron  4 MG tablet Commonly known as: Zofran  Take 1 tablet (4 mg total) by mouth every 8 (eight) hours as needed for nausea or vomiting.   pantoprazole  40 MG tablet Commonly known as: PROTONIX  Take 40 mg by mouth daily with breakfast.   promethazine  25 MG tablet Commonly known as: PHENERGAN  Take 25 mg by mouth every 6 (six) hours as needed for nausea or vomiting.   tiZANidine  2 MG tablet Commonly known as: ZANAFLEX  Take 2 mg by mouth every 12 (twelve) hours as needed for muscle spasms.   Xarelto  20 MG Tabs tablet Generic drug: rivaroxaban  Take 20 mg by mouth daily with  supper.        Follow-up Information     Dreama Mcardle, MD Follow up.   Specialty: Internal Medicine Contact information: [address] [place] [address] [phone]         Fidel Rogue, MD Follow up.   Specialty: Orthopedic Surgery Contact information: [address] [place] [address] [place] [address] [phone]                Discharge Exam: Fredricka Weights   06/02/24 1335 06/02/24 2058  Weight: 102.1 kg 106 kg  Nontoxic obese pleasant 68yo F sitting in chair in no distress. Says her left knee acts up from time to time, feels like that, nothing new/strange about it, is not concerned about it. Wants to go home today.  Clear, nonlabored on room air. No wheezes.  RRR Left knee with bony hypertrophy, disfigured surgical scar, anterior fluctuance is nontender.  Patella compression without pain, no passive ROM pain.    Condition at discharge: stable  The results of significant diagnostics from this hospitalization (including imaging, microbiology, ancillary and laboratory) are listed below for reference.   Imaging Studies: CT KNEE LEFT W CONTRAST Result Date: 06/05/2024 CLINICAL DATA:  Left knee pain for 1.5 years. Excisional total knee arthroplasty with antibiotic spacers 03/10/2023. Total knee revision 08/25/2023. EXAM: CT OF THE LEFT KNEE WITH CONTRAST TECHNIQUE: Multidetector CT imaging was performed following the standard protocol during bolus administration of intravenous contrast. RADIATION DOSE REDUCTION: This exam was performed according to the departmental dose-optimization program which includes automated exposure control, adjustment of the mA and/or kV according to patient size and/or use of iterative reconstruction technique. CONTRAST:  75mL OMNIPAQUE  IOHEXOL  300 MG/ML  SOLN COMPARISON:  Radiographs 12/06/2023 FINDINGS: Bones/Joint/Cartilage Long stem total knee prosthesis observed along with a distal femoral shaft cerclage wire. Currently no significant abnormal lucency along components of the prosthesis to indicate loosening or infection. No visible periprosthetic fracture. Although partially obscured by metal artifact, there is a suggestion of chondral thinning along the patella close association of the posterior patellar margin with the femoral trochlear margin of the implant. Tiny ossicle in the expected vicinity of Hoffa's fat pad below the patella measuring 0.4 cm in diameter on image 74 series 6. Ligaments Suboptimally assessed by CT. Muscles and Tendons Unremarkable Soft tissues Extending from the vertical level of the cerclage distally and extending anterior to the distal vastus lateralis muscle, distal quadriceps musculature, and lateral retinaculum, we observe an 13.4 by 6.6 by 3.4 cm (volume = 160 cm^3) fluid collection moderately thick  margin and some posterior calcification along the subcutaneous tissues and superficial fascia margin. No internal gas density. This could be a postoperative fluid collection is less likely to be an abscess although correlation with any fluctuance is recommended. IMPRESSION: 1. Long stem total knee prosthesis with a distal femoral shaft cerclage wire. No current findings of loosening or marginal infection along the prosthesis. 2. Although partially obscured by metal artifact, there is a suggestion of chondral thinning along the patella in close association with the femoral trochlear margin of the implant. 3. 160 cc fluid collection along the subcutaneous tissues and superficial fascia margin anterolateral to the knee joint, extending anterior to the distal vastus lateralis muscle, distal quadriceps musculature, and lateral retinaculum. This could be a postoperative fluid collection is less likely to be an abscess although correlation with any fluctuance is recommended. Electronically Signed   By: Merian Wroe Salvage M.D.   On: 06/05/2024 12:20   CT ABDOMEN PELVIS W  CONTRAST Result Date: 06/02/2024 CLINICAL DATA:  Abdominal pain for 2 months. EXAM: CT ABDOMEN AND PELVIS WITH CONTRAST TECHNIQUE: Multidetector CT imaging of the abdomen and pelvis was performed using the standard protocol following bolus administration of intravenous contrast. RADIATION DOSE REDUCTION: This exam was performed according to the departmental dose-optimization program which includes automated exposure control, adjustment of the mA and/or kV according to patient size and/or use of iterative reconstruction technique. CONTRAST:  OMNIPAQUE  IOHEXOL  300 MG/ML  SOLN COMPARISON:  04/26/2024. FINDINGS: Lower chest: Bibasilar consolidation or volume loss. Large hiatal hernia. No pleural or pericardial effusions. Hepatobiliary: Fatty infiltration liver. No focal hepatic lesions. No biliary ductal dilatation. Unremarkable gallbladder. Pancreas:  Unremarkable. No pancreatic ductal dilatation or surrounding inflammatory changes. Spleen: Normal in size without focal abnormality. Adrenals/Urinary Tract: No adrenal lesions. There are a couple subcentimeter cyst left kidney. No nephrolithiasis or hydronephrosis. Unremarkable urinary bladder. Stomach/Bowel: Postop changes in the stomach. No bowel dilatation to suggest obstruction. Appendix appears to have been removed. Vascular/Lymphatic: IVC filter. Small filling defect in the IVC distal to the filter consistent with thrombus. No enlarged abdominal or pelvic lymph nodes. Reproductive: Status post hysterectomy. No adnexal masses. Other: No abdominal wall hernia. There are postop changes in the anterior abdominal wall consistent with previous hernia repair. No abdominopelvic ascites. Musculoskeletal: Thoracolumbosacral degenerative changes. IMPRESSION: 1. Bibasilar consolidation or volume loss. 2. Large hiatal hernia. 3. Fatty infiltration of the liver. 4. IVC filter with small amount of thrombus distal to the filter. 5. No acute abdominal or pelvic pathology identified. Electronically Signed   By: Fonda Field M.D.   On: 06/02/2024 20:01   DG Chest 2 View Result Date: 06/02/2024 CLINICAL DATA:  Productive cough with shortness of breath and sore throat. EXAM: CHEST - 2 VIEW COMPARISON:  March 29, 2024 FINDINGS: The heart size and mediastinal contours are within normal limits. Mild left basilar infiltrate is noted. No pleural effusion or pneumothorax is identified. Small radiopaque surgical coils are seen overlying the upper abdomen. A chronic deformity of the right humeral head and neck is noted. Multilevel degenerative changes are seen throughout the thoracic spine. IMPRESSION: Mild left basilar infiltrate. Electronically Signed   By: Suzen Dials M.D.   On: 06/02/2024 17:07   MM 3D DIAGNOSTIC MAMMOGRAM BILATERAL BREAST Result Date: 06/01/2024 CLINICAL DATA:  68 year old woman with diffuse LEFT  breast pain for approximately 8 weeks status post injury while turning in bed. RIGHT annual mammogram. EXAM: DIGITAL DIAGNOSTIC BILATERAL MAMMOGRAM WITH TOMOSYNTHESIS AND CAD; ULTRASOUND LEFT BREAST LIMITED TECHNIQUE: Bilateral digital diagnostic mammography and breast tomosynthesis was performed. The images were evaluated with computer-aided detection. ; Targeted ultrasound examination of the left breast was performed. COMPARISON:  Previous exam(s). ACR Breast Density Category b: There are scattered areas of fibroglandular density. FINDINGS: RIGHT: Mammogram: No suspicious mass, distortion, or microcalcifications are identified to suggest presence of malignancy. LEFT: Mammogram: Obscured oval mass seen in the upper outer LEFT breast at posterior depth. No additional mass, distortion, or microcalcifications. Ultrasound: Targeted sonographic evaluation of the LEFT breast demonstrates an oval hypoechoic mass measuring 4.2 x 2.1 x 3.1 cm at 2 o'clock 12 CMFN, which corresponds to the mammographic mass at posterior depth. IMPRESSION: Probably benign of 4.2 cm LEFT breast mass (2 o'clock 12 CMFN) corresponding to the mammographic mass at posterior depth, favored to be a hematoma. RECOMMENDATION: LEFT breast ultrasound in 6 months. I have discussed the findings and recommendations with the patient. If applicable, a reminder letter will be sent to the  patient regarding the next appointment. BI-RADS CATEGORY  3: Probably benign. Electronically Signed   By: Aliene Lloyd M.D.   On: 06/01/2024 16:46   US  LIMITED ULTRASOUND INCLUDING AXILLA LEFT BREAST  Result Date: 06/01/2024 CLINICAL DATA:  68 year old woman with diffuse LEFT breast pain for approximately 8 weeks status post injury while turning in bed. RIGHT annual mammogram. EXAM: DIGITAL DIAGNOSTIC BILATERAL MAMMOGRAM WITH TOMOSYNTHESIS AND CAD; ULTRASOUND LEFT BREAST LIMITED TECHNIQUE: Bilateral digital diagnostic mammography and breast tomosynthesis was performed. The  images were evaluated with computer-aided detection. ; Targeted ultrasound examination of the left breast was performed. COMPARISON:  Previous exam(s). ACR Breast Density Category b: There are scattered areas of fibroglandular density. FINDINGS: RIGHT: Mammogram: No suspicious mass, distortion, or microcalcifications are identified to suggest presence of malignancy. LEFT: Mammogram: Obscured oval mass seen in the upper outer LEFT breast at posterior depth. No additional mass, distortion, or microcalcifications. Ultrasound: Targeted sonographic evaluation of the LEFT breast demonstrates an oval hypoechoic mass measuring 4.2 x 2.1 x 3.1 cm at 2 o'clock 12 CMFN, which corresponds to the mammographic mass at posterior depth. IMPRESSION: Probably benign of 4.2 cm LEFT breast mass (2 o'clock 12 CMFN) corresponding to the mammographic mass at posterior depth, favored to be a hematoma. RECOMMENDATION: LEFT breast ultrasound in 6 months. I have discussed the findings and recommendations with the patient. If applicable, a reminder letter will be sent to the patient regarding the next appointment. BI-RADS CATEGORY  3: Probably benign. Electronically Signed   By: Aliene Lloyd M.D.   On: 06/01/2024 16:46    Microbiology: Results for orders placed or performed during the hospital encounter of 06/02/24  Resp panel by RT-PCR (RSV, Flu A&B, Covid) Throat     Status: None   Collection Time: 06/02/24  2:01 PM   Specimen: Throat; Nasal Swab  Result Value Ref Range Status   SARS Coronavirus 2 by RT PCR NEGATIVE NEGATIVE Final    Comment: (NOTE) SARS-CoV-2 target nucleic acids are NOT DETECTED.  The SARS-CoV-2 RNA is generally detectable in upper respiratory specimens during the acute phase of infection. The lowest concentration of SARS-CoV-2 viral copies this assay can detect is 138 copies/mL. A negative result does not preclude SARS-Cov-2 infection and should not be used as the sole basis for treatment or other  patient management decisions. A negative result may occur with  improper specimen collection/handling, submission of specimen other than nasopharyngeal swab, presence of viral mutation(s) within the areas targeted by this assay, and inadequate number of viral copies(<138 copies/mL). A negative result must be combined with clinical observations, patient history, and epidemiological information. The expected result is Negative.  Fact Sheet for Patients:  BloggerCourse.com  Fact Sheet for Healthcare Providers:  SeriousBroker.it  This test is no t yet approved or cleared by the [place]  FDA and  has been authorized for detection and/or diagnosis of SARS-CoV-2 by FDA under an Emergency Use Authorization (EUA). This EUA will remain  in effect (meaning this test can be used) for the duration of the COVID-19 declaration under Section 564(b)(1) of the Act, 21 U.S.C.section 360bbb-3(b)(1), unless the authorization is terminated  or revoked sooner.       Influenza A by PCR NEGATIVE NEGATIVE Final   Influenza B by PCR NEGATIVE NEGATIVE Final    Comment: (NOTE) The Xpert Xpress SARS-CoV-2/FLU/RSV plus assay is intended as an aid in the diagnosis of influenza from Nasopharyngeal swab specimens and should not be used as a sole basis for treatment. Nasal washings  and aspirates are unacceptable for Xpert Xpress SARS-CoV-2/FLU/RSV testing.  Fact Sheet for Patients: BloggerCourse.com  Fact Sheet for Healthcare Providers: SeriousBroker.it  This test is not yet approved or cleared by the [place]  FDA and has been authorized for detection and/or diagnosis of SARS-CoV-2 by FDA under an Emergency Use Authorization (EUA). This EUA will remain in effect (meaning this test can be used) for the duration of the COVID-19 declaration under Section 564(b)(1) of the Act, 21 U.S.C. section  360bbb-3(b)(1), unless the authorization is terminated or revoked.     Resp Syncytial Virus by PCR NEGATIVE NEGATIVE Final    Comment: (NOTE) Fact Sheet for Patients: BloggerCourse.com  Fact Sheet for Healthcare Providers: SeriousBroker.it  This test is not yet approved or cleared by the [place]  FDA and has been authorized for detection and/or diagnosis of SARS-CoV-2 by FDA under an Emergency Use Authorization (EUA). This EUA will remain in effect (meaning this test can be used) for the duration of the COVID-19 declaration under Section 564(b)(1) of the Act, 21 U.S.C. section 360bbb-3(b)(1), unless the authorization is terminated or revoked.  Performed at [facility], [address], [place], [place] [address]   Blood culture (routine x 2)     Status: None (Preliminary result)   Collection Time: 06/02/24  5:41 PM   Specimen: Left Antecubital; Blood  Result Value Ref Range Status   Specimen Description LEFT ANTECUBITAL BOTTLES DRAWN AEROBIC ONLY  Final   Special Requests Blood Culture adequate volume  Final   Culture   Final    NO GROWTH 3 DAYS Performed at [facility], [address], [place], [place] [address]    Report Status PENDING  Incomplete  Blood culture (routine x 2)     Status: None (Preliminary result)   Collection Time: 06/02/24  5:41 PM   Specimen: BLOOD LEFT HAND  Result Value Ref Range Status   Specimen Description BLOOD LEFT HAND BOTTLES DRAWN AEROBIC ONLY  Final   Special Requests Blood Culture adequate volume  Final   Culture   Final    NO GROWTH 3 DAYS Performed at [facility], [address] [place], [place] [address]    Report Status PENDING  Incomplete  Expectorated Sputum Assessment w Gram Stain, Rflx to Resp Cult     Status: None   Collection Time: 06/05/24  9:25 AM   Specimen: Sputum  Result Value Ref Range Status   Specimen Description SPUTUM  Final   Special Requests NONE  Final    Sputum evaluation   Final    THIS SPECIMEN IS ACCEPTABLE FOR SPUTUM CULTURE Performed at [facility], [address], [place], [place] [address]    Report Status 06/05/2024 FINAL  Final  Culture, Respiratory w Gram Stain     Status: None (Preliminary result)   Collection Time: 06/05/24  9:25 AM   Specimen: SPU  Result Value Ref Range Status   Specimen Description   Final    SPUTUM Performed at [facility], [address] [place], [place] [address]    Special Requests   Final    NONE Reflexed from [id number] Performed at [facility], [address], [place], [place] [address]    Gram Stain   Final    MODERATE WBC PRESENT, PREDOMINANTLY PMN NO ORGANISMS SEEN Performed at [facility], [address], [place], [place] [address]    Culture PENDING  Incomplete   Report Status PENDING  Incomplete    Labs: CBC: Recent Labs  Lab  06/02/24 1354 06/03/24 0340 06/04/24 0817 06/05/24 0731  WBC 30.0* 19.8* 10.9* 8.7  NEUTROABS 24.6*  --   --   --   HGB 15.7* 12.3 13.4 12.6  HCT 49.2* 40.5 44.2 41.8  MCV 98.8 100.0 102.3* 104.2*  PLT 170 137* 130* 140*   Basic Metabolic Panel: Recent Labs  Lab 06/02/24 1354 06/03/24 0340 06/04/24 0817 06/05/24 0731  NA 140 142 141 134*  K 3.8 3.5 3.4* 5.5*  CL 102 107 102 103  CO2 28 28 27 23   GLUCOSE 118* 113* 103* 103*  BUN 15 13 10 8   CREATININE 0.74 0.60 0.69 0.73  CALCIUM 8.6* 8.0* 8.5* 7.9*   Liver Function Tests: Recent Labs  Lab 06/02/24 1354  AST 21  ALT 17  ALKPHOS 104  BILITOT 0.6  PROT 6.4*  ALBUMIN 2.8*   CBG: No results for input(s): GLUCAP in the last 168 hours.  Discharge time spent: greater than 30 minutes.  Signed: Bernardino KATHEE Come, MD [facility] 06/05/2024

## 2024-06-05 NOTE — Care Management Important Message (Signed)
 Important Message  Patient Details  Name: Norma Anthony MRN: [id number] Date of Birth: [birth year]   Important Message Given:  N/A - LOS <3 / Initial given by admissions     [name] 06/05/2024, 1:29 PM

## 2024-06-05 NOTE — Evaluation (Signed)
 Physical Therapy Evaluation Patient Details Name: Norma Anthony MRN: [id number] DOB: 01-15-1956 Today's Date: 06/05/2024  History of Present Illness  Norma Anthony  is a 68 y.o. 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  She is currently back on Xarelto .  - Patient presents to ED secondary to complaints of cough, dyspnea, congestion, generalized illness and fatigue, as well she does report abdominal pain, chronic, and diarrhea.  - In ED her workup was significant for elevated white blood cell count to 30K, she is currently requiring up to 4 L oxygen from baseline 2, chest x-ray significant for left lung opacity concerning for pneumonia, [facility] hospitalist consulted to admit.   Clinical Impression  Patient functioning near baseline for functional mobility and gait demonstrating good return for ambulating in room, hallway without loss of balance using RW.  Patient on room air with SpO2 at 93% during ambulation and tolerated sitting up in chair after therapy. PLAN:  Patient to be discharged home today and discharged from acute physical therapy to care of nursing for ambulation as tolerated for length of stay with recommendations stated below          If plan is discharge home, recommend the following: A little help with walking and/or transfers;A little help with bathing/dressing/bathroom;Help with stairs or ramp for entrance;Assistance with cooking/housework   Can travel by private vehicle        Equipment Recommendations None recommended by PT  Recommendations for Other Services        Functional Status Assessment Patient has had a recent decline in their functional status and demonstrates the ability to make significant improvements in function in a reasonable and predictable amount of time.     Precautions / Restrictions Precautions Precautions: Fall Recall of Precautions/Restrictions: Intact Restrictions Weight Bearing Restrictions Per Provider Order: No      Mobility  Bed Mobility Overal bed mobility: Modified Independent                  Transfers Overall transfer level: Modified independent                      Ambulation/Gait Ambulation/Gait assistance: Supervision, Contact guard assist Gait Distance (Feet): 45 Feet Assistive device: Rolling walker (2 wheels) Gait Pattern/deviations: Decreased step length - right, Decreased step length - left, Decreased stance time - left, Decreased stride length, Antalgic, Trunk flexed Gait velocity: decreased     General Gait Details: slightly labored movement with trunk flexed, no loss of balance, limited mostly due to fatigue and mild SOB, on room air with SpO2 at 93%  Stairs            Wheelchair Mobility     Tilt Bed    Modified Rankin (Stroke Patients Only)       Balance Overall balance assessment: Needs assistance Sitting-balance support: Feet supported, No upper extremity supported Sitting balance-Leahy Scale: Good Sitting balance - Comments: seated at EOB   Standing balance support: Reliant on assistive device for balance, During functional activity, Bilateral upper extremity supported Standing balance-Leahy Scale: Fair Standing balance comment: fair/good using RW  Pertinent Vitals/Pain Pain Assessment Pain Assessment: 0-10 Pain Score: 8  Pain Location: left knee Pain Descriptors / Indicators: Sore, Discomfort Pain Intervention(s): Limited activity within patient's tolerance, Monitored during session, Repositioned    Home  Living Family/patient expects to be discharged to:: Private residence Living Arrangements: Spouse/significant other Available Help at Discharge: Family;Available 24 hours/day Type of Home: House Home Access: Stairs to enter Entrance Stairs-Rails: None Entrance Stairs-Number of Steps: 2   Home Layout: One level Home Equipment: Agricultural consultant (2 wheels);Cane - single point;Wheelchair Financial trader (4 wheels);BSC/3in1      Prior Function Prior Level of Function : History of Falls (last six months);Needs assist             Mobility Comments: Household ambulation using RW ADLs Comments: Assisted by family     Extremity/Trunk Assessment   Upper Extremity Assessment Upper Extremity Assessment: Defer to OT evaluation    Lower Extremity Assessment Lower Extremity Assessment: Generalized weakness    Cervical / Trunk Assessment Cervical / Trunk Assessment: Kyphotic  Communication   Communication Communication: No apparent difficulties    Cognition Arousal: Alert Behavior During Therapy: WFL for tasks assessed/performed   PT - Cognitive impairments: No apparent impairments                         Following commands: Intact       Cueing Cueing Techniques: Verbal cues     General Comments      Exercises     Assessment/Plan    PT Assessment All further PT needs can be met in the next venue of care  PT Problem List Decreased strength;Decreased activity tolerance;Decreased balance;Decreased mobility;Pain       PT Treatment Interventions      PT Goals (Current goals can be found in the Care Plan section)  Acute Rehab PT Goals Patient Stated Goal: return home with family to assist PT Goal Formulation: With patient Time For Goal Achievement: 06/05/24 Potential to Achieve Goals: Good    Frequency       Co-evaluation               AM-PAC PT 6 Clicks Mobility  Outcome Measure Help needed turning from your back to your side while in a  flat bed without using bedrails?: None Help needed moving from lying on your back to sitting on the side of a flat bed without using bedrails?: A Little Help needed moving to and from a bed to a chair (including a wheelchair)?: A Little Help needed standing up from a chair using your arms (e.g., wheelchair or bedside chair)?: None Help needed to walk in hospital room?: A Little Help needed climbing 3-5 steps with a railing? : A Lot 6 Click Score: 19    End of Session   Activity Tolerance: Patient tolerated treatment well;Patient limited by fatigue Patient left: in chair;with call bell/phone within reach Nurse Communication: Mobility status PT Visit Diagnosis: Unsteadiness on feet (R26.81);Other abnormalities of gait and mobility (R26.89);Muscle weakness (generalized) (M62.81)    Time: 9075-9055 PT Time Calculation (min) (ACUTE ONLY): 20 min   Charges:   PT Evaluation $PT Eval Moderate Complexity: 1 Mod PT Treatments $Therapeutic Activity: 8-22 mins PT General Charges $$ ACUTE PT VISIT: 1 Visit         2:00 PM, 06/05/24 Lynwood Music, MPT Physical Therapist with [facility] [phone] office [phone] mobile phone

## 2024-06-05 NOTE — TOC Initial Note (Signed)
 Transition of Care [facility]) - Initial/Assessment Note    Patient Details  Name: Norma Anthony MRN: [id number] Date of Birth: [birth year]  [facility]) CM/SW Contact:    Noreen KATHEE Pinal, LCSWA Phone Number: 06/05/2024, 11:04 AM  Clinical Narrative:                  CSW spoke with patient at bedside and assessed her. Patient is at risk for readmission due to Pneumonia. Patient lives at home with spouse who assist with most ADL's. Patient has a walker , WC, and shower chair at home for equipment. PT recommended HHPT. Patient agreeable to service and requested [facility]. Referral was made to [facility] and [facility] confirmed being able to accept. TOC will continue to follow.   Expected Discharge Plan: Home w Home Health Services Barriers to Discharge: Continued Medical Work up   Patient Goals and CMS Choice Patient states their goals for this hospitalization and ongoing recovery are:: return back home     [place] ownership interest in [facility].provided to:: Patient    Expected Discharge Plan and Services In-house Referral: Clinical Social Work   Post Acute Care Choice: Durable Medical Equipment Living arrangements for the past 2 months: Single Family Home                 DME Arranged: N/A         HH Arranged: PT HH Agency: [facility] Date HH Agency Contacted: 06/05/24 Time HH Agency Contacted: 1103 Representative spoke with at [facility] Agency: [facility]  Prior Living Arrangements/Services Living arrangements for the past 2 months: Single Family Home Lives with:: Spouse Patient language and need for interpreter reviewed:: Yes Do you feel safe going back to the place where you live?: Yes      Need for Family Participation in Patient Care: Yes (Comment) Care giver support system in place?: No (comment) Current home services: DME Criminal Activity/Legal Involvement Pertinent to Current Situation/Hospitalization: No - Comment as needed  Activities  of Daily Living   ADL Screening (condition at time of admission) Independently performs ADLs?: No Does the patient have a NEW difficulty with bathing/dressing/toileting/self-feeding that is expected to last >3 days?: Yes (Initiates electronic notice to provider for possible OT consult) Does the patient have a NEW difficulty with getting in/out of bed, walking, or climbing stairs that is expected to last >3 days?: Yes (Initiates electronic notice to provider for possible PT consult) Does the patient have a NEW difficulty with communication that is expected to last >3 days?: No Is the patient deaf or have difficulty hearing?: No Does the patient have difficulty seeing, even when wearing glasses/contacts?: No Does the patient have difficulty concentrating, remembering, or making decisions?: No  Permission Sought/Granted      Share Information with NAME: Idabelle     Permission granted to share info w Relationship: Patient     Emotional Assessment Appearance:: Appears stated age Attitude/Demeanor/Rapport: Engaged, Self-Confident Affect (typically observed): Accepting Orientation: : Oriented to Situation, Oriented to  Time, Oriented to Place, Oriented to Self Alcohol  / Substance Use: Not Applicable Psych Involvement: No (comment)  Admission diagnosis:  Pneumonia [J18.9] Pneumonia of left lower lobe due to infectious organism [J18.9] Sepsis without acute organ dysfunction, due to unspecified organism [facility]) [A41.9] Patient Active Problem List   Diagnosis Date Noted   Pneumonia 06/02/2024   Abdominal pain 03/30/2024   Vomiting 03/30/2024   Acute diarrhea 03/30/2024   Hypokalemia 03/30/2024   Prolonged QT interval 03/30/2024  Acute diverticulitis 03/29/2024   Ambulatory dysfunction 11/19/2023   Kidney stones 11/09/2023   Infection of total right knee replacement, 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/25/2023   Long term current use of antibiotics 04/14/2023   Intertriginous dermatitis associated  with moisture 04/14/2023   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   Surgical wound dehiscence 03/08/2023   Failed total knee, 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   Hemarthrosis of left knee 12/11/2022   Chronic respiratory failure with hypoxia (HCC) 12/11/2022   Generalized anxiety disorder 12/11/2022   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   DOE (dyspnea on exertion) 04/30/2022   Ex-smoker 04/30/2022   Lung nodule 03/24/2022   Thyroid  nodule 03/24/2022   Opioid dependence (HCC) 03/23/2022   COPD (chronic obstructive pulmonary disease) (HCC) 03/22/2022   Obstructive sleep apnea syndrome 07/01/2021   Chronic deep vein thrombosis (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 [place] [address] Phone: [phone] Fax: [phone]  [id number] - [place], [place] - [address] AT [facility] [address]  [place] [address] Phone: [phone] Fax: [phone]     Social Drivers of Health (SDOH) Social History: SDOH Screenings   Food Insecurity: No Food Insecurity (06/02/2024)  Housing: Low Risk  (06/02/2024)  Transportation Needs: No Transportation Needs (06/02/2024)  Utilities: Not At Risk (06/02/2024)  Depression (PHQ2-9): Low Risk  (04/07/2023)  Financial Resource Strain: Low Risk  (04/23/2021)   [facility] from [facility]  Social Connections: Moderately Integrated (06/02/2024)  Stress: Stress Concern Present (04/23/2021)   Received from [facility]  Tobacco Use: Medium Risk (06/02/2024)   SDOH  Interventions:     Readmission Risk Interventions    06/05/2024   11:00 AM 03/30/2024    8:49 AM 08/28/2023    2:19 PM  Readmission Risk Prevention Plan  Transportation Screening Complete Complete Complete  PCP or Specialist Appt within 3-5 Days   Complete  HRI or Home Care Consult   Complete  Social Work Consult for Recovery Care Planning/Counseling   Complete  Palliative Care Screening   Not Applicable  Medication Review Oceanographer) Complete Complete Complete  HRI or Home Care Consult Complete Complete   SW Recovery Care/Counseling Consult Complete Complete   Palliative Care Screening Not Applicable Not Applicable   Skilled Nursing Facility Not Applicable Not Applicable

## 2024-06-07 LAB — CULTURE, BLOOD (ROUTINE X 2)
Culture: NO GROWTH
Culture: NO GROWTH
Special Requests: ADEQUATE
Special Requests: ADEQUATE

## 2024-06-07 LAB — CULTURE, RESPIRATORY W GRAM STAIN: Culture: NORMAL

## 2024-06-14 ENCOUNTER — Ambulatory Visit: Payer: Self-pay | Admitting: Internal Medicine

## 2024-06-14 ENCOUNTER — Ambulatory Visit (HOSPITAL_COMMUNITY)
Admission: RE | Admit: 2024-06-14 | Discharge: 2024-06-14 | Disposition: A | Discharge status: Home / Self Care | Source: Ambulatory Visit | Attending: Internal Medicine | Admitting: Internal Medicine

## 2024-06-14 ENCOUNTER — Ambulatory Visit: Admitting: Internal Medicine

## 2024-06-14 ENCOUNTER — Encounter: Payer: Self-pay | Admitting: Internal Medicine

## 2024-06-14 VITALS — BP 111/71 | HR 114 | Ht 65.0 in | Wt 231.6 lb

## 2024-06-14 DIAGNOSIS — J9611 Chronic respiratory failure with hypoxia: Secondary | ICD-10-CM

## 2024-06-14 DIAGNOSIS — R058 Other specified cough: Secondary | ICD-10-CM

## 2024-06-14 DIAGNOSIS — R0609 Other forms of dyspnea: Secondary | ICD-10-CM

## 2024-06-14 MED ORDER — METHYLPREDNISOLONE ACETATE 80 MG/ML IJ SUSP
120.0000 mg | Freq: Once | INTRAMUSCULAR | Status: AC
  Administered 2024-06-14: 120 mg via INTRAMUSCULAR

## 2024-06-14 NOTE — Patient Instructions (Addendum)
 Pantoprazole  (protonix ) 40 mg   Take  30-60 min before first meal of the day and Pepcid  (famotidine )  20 mg after supper until return to office - this is the best way to tell whether stomach acid is contributing to your problem.   GERD (REFLUX)  is an extremely common cause of respiratory symptoms just like yours , many times with no obvious heartburn at all.    It can be treated with medication, but also with lifestyle changes including elevation of the head of your bed (ideally with 6 -8inch blocks under the headboard of your bed),  Smoking cessation, avoidance of late meals, excessive alcohol , and avoid fatty foods, chocolate, peppermint, colas, red wine, and acidic juices such as orange juice.  NO MINT OR MENTHOL  PRODUCTS SO NO COUGH DROPS  USE SUGARLESS CANDY INSTEAD (Jolley ranchers or Stover's or Life Savers) or even ice chips will also do - the key is to swallow to prevent all throat clearing. NO OIL BASED VITAMINS - use powdered substitutes.  Avoid fish oil when coughing.   Depomedrol 120 mg IM   Mucinex  dm 1200 mg every 12 hours  as needed  for cough / congestion   Only use your albuterol  as a rescue medication to be used if you can't catch your breath by resting or doing a relaxed purse lip breathing pattern.  - The less you use it, the better it will work when you need it. - Ok to use up to 2 puffs  every 4 hours if you must but call for immediate appointment if use goes up over your usual need - Don't leave home without it !!  (think of it like the spare tire for your car)   Make sure you check your oxygen saturation  AT  your highest level of activity (not after you stop)   to be sure it stays over 90% and adjust  02 flow upward to maintain this level if needed but remember to turn it back to previous settings when you stop  with goal over 90% at rest, sleeping and walking   Please remember to go to the  x-ray department  for your tests - we will call you with the results when they  are available    Please schedule a follow up office visit in 6 weeks, call sooner if needed with all medications /inhalers/ solutions in hand so we can verify exactly what you are taking. This includes all medications from all doctors and over the counters
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
## 2024-06-15 NOTE — Assessment & Plan Note (Addendum)
 Onset ? July 2022 p moved into new home with pts 07/2021 s airflow obst and neg resp to saba clinically  - assoc with large HH by CTa 09/04/23 s/p prior NF - flared post hops for pna 05/2024 > 06/14/2024  rx max rx for gerd/mucinex  dm/ depomedrol 120 mg  IM   Comment: Upper airway cough syndrome (previously labeled PNDS),  is so named because it's frequently impossible to sort out how much is  CR/sinusitis with freq throat clearing (which can be related to primary GERD)   vs  causing  secondary ( extra esophageal)  GERD from wide swings in gastric pressure that occur with throat clearing, often  promoting self use of mint and menthol  lozenges that reduce the lower esophageal sphincter tone and exacerbate the problem further in a cyclical fashion.   These are the same pts (now being labeled as having irritable larynx syndrome by some cough centers) who not infrequently have a history of having failed to tolerate ace inhibitors,  dry powder inhalers or biphosphonates or report having atypical/extraesophageal reflux symptoms (LPR)  that don't respond to standard doses of PPI  and are easily confused as having aecopd or asthma flares by even experienced allergists/ pulmonologists (myself included).   Rec as above/ f/u 6 weeks with all meds in hand using a trust but verify approach to confirm accurate Medication  Reconciliation The principal here is that until we are certain that the  patients are doing what we've asked, it makes no sense to ask them to do more.

## 2024-06-15 NOTE — Assessment & Plan Note (Signed)
 Onset p stopped smoking March 2022 / active GERD and MO  - PFTs 07/23/21 restrictive  - 04/30/2022   Walked on RA  x  3  lap(s) =  approx 450  ft  @ mod pace, stopped due to end of study mild sob with lowest 02 sats 91%   - 04/30/2022 rec max gerd rx/  And prn saba - s/p CAP  with acute on chronic resp failure 06/02/24 admit  - 06/14/2024 office eval for post hosp cough > see [facility]   She is back to baseline with predominantly restrictive problems related to obesity and kyphosis > no change baseline rx

## 2024-06-15 NOTE — Assessment & Plan Note (Addendum)
 On 2.5 lpm as needed as of 1st office eval 04/30/22  - acutely worse with admit for CAP 06/02/24 but amb s 02 prior to d/c s desats per hosp notes   Rec:  continue with prn:  Advised: Make sure you check your oxygen saturation  AT  your highest level of activity (not after you stop)   to be sure it stays over 90% and adjust  02 flow upward to maintain this level if needed but remember to turn it back to previous settings when you stop (to conserve your supply).    Each maintenance medication was reviewed in detail including emphasizing most importantly the difference between maintenance and prns and under what circumstances the prns are to be triggered using an action plan format where appropriate.  Total time for H and P, chart review, counseling, reviewing hfa/02/ pulose ox  device(s) and generating customized AVS unique to this office visit / same day charting = 30 min post hosp  f/u ov

## 2024-07-01 ENCOUNTER — Other Ambulatory Visit: Payer: Self-pay | Admitting: Infectious Diseases

## 2024-07-27 ENCOUNTER — Ambulatory Visit: Admitting: Internal Medicine

## 2024-08-07 ENCOUNTER — Inpatient Hospital Stay

## 2024-08-07 VITALS — BP 123/65 | HR 74 | Temp 98.1°F | Resp 18 | Ht 65.0 in | Wt 231.0 lb

## 2024-08-07 DIAGNOSIS — I82401 Acute embolism and thrombosis of unspecified deep veins of right lower extremity: Secondary | ICD-10-CM | POA: Insufficient documentation

## 2024-08-07 DIAGNOSIS — Z86711 Personal history of pulmonary embolism: Secondary | ICD-10-CM | POA: Diagnosis not present

## 2024-08-07 DIAGNOSIS — Z888 Allergy status to other drugs, medicaments and biological substances status: Secondary | ICD-10-CM | POA: Diagnosis not present

## 2024-08-07 DIAGNOSIS — Z87891 Personal history of nicotine dependence: Secondary | ICD-10-CM | POA: Insufficient documentation

## 2024-08-07 DIAGNOSIS — Z96653 Presence of artificial knee joint, bilateral: Secondary | ICD-10-CM | POA: Diagnosis not present

## 2024-08-07 DIAGNOSIS — Z8249 Family history of ischemic heart disease and other diseases of the circulatory system: Secondary | ICD-10-CM | POA: Insufficient documentation

## 2024-08-07 DIAGNOSIS — Z79899 Other long term (current) drug therapy: Secondary | ICD-10-CM | POA: Insufficient documentation

## 2024-08-07 DIAGNOSIS — Z90710 Acquired absence of both cervix and uterus: Secondary | ICD-10-CM | POA: Diagnosis not present

## 2024-08-07 DIAGNOSIS — Z7901 Long term (current) use of anticoagulants: Secondary | ICD-10-CM | POA: Diagnosis not present

## 2024-08-07 LAB — CBC WITH DIFFERENTIAL (CANCER CENTER ONLY)
Abs Immature Granulocytes: 0.01 K/uL (ref 0.00–0.07)
Basophils Absolute: 0 K/uL (ref 0.0–0.1)
Basophils Relative: 1 %
Eosinophils Absolute: 0.4 K/uL (ref 0.0–0.5)
Eosinophils Relative: 5 %
HCT: 44.8 % (ref 36.0–46.0)
Hemoglobin: 14.2 g/dL (ref 12.0–15.0)
Immature Granulocytes: 0 %
Lymphocytes Relative: 30 %
Lymphs Abs: 2.3 K/uL (ref 0.7–4.0)
MCH: 30.7 pg (ref 26.0–34.0)
MCHC: 31.7 g/dL (ref 30.0–36.0)
MCV: 97 fL (ref 80.0–100.0)
Monocytes Absolute: 0.6 K/uL (ref 0.1–1.0)
Monocytes Relative: 7 %
Neutro Abs: 4.4 K/uL (ref 1.7–7.7)
Neutrophils Relative %: 57 %
Platelet Count: 168 K/uL (ref 150–400)
RBC: 4.62 MIL/uL (ref 3.87–5.11)
RDW: 15.4 % (ref 11.5–15.5)
WBC Count: 7.7 K/uL (ref 4.0–10.5)
nRBC: 0 % (ref 0.0–0.2)

## 2024-08-07 LAB — BASIC METABOLIC PANEL - CANCER CENTER ONLY
Anion gap: 5 (ref 5–15)
BUN: 14 mg/dL (ref 8–23)
CO2: 34 mmol/L — ABNORMAL HIGH (ref 22–32)
Calcium: 9.2 mg/dL (ref 8.9–10.3)
Chloride: 103 mmol/L (ref 98–111)
Creatinine: 0.64 mg/dL (ref 0.44–1.00)
GFR, Estimated: >60 mL/min (ref >60)
Glucose, Bld: 95 mg/dL (ref 70–99)
Potassium: 3.8 mmol/L (ref 3.5–5.1)
Sodium: 142 mmol/L (ref 135–145)

## 2024-08-07 NOTE — Progress Notes (Signed)
 [facility] CONSULT NOTE  Patient Care Team: Norma Mcardle, MD as PCP - General (Internal Medicine)   ASSESSMENT & PLAN:  68 y.o.female with history of COPD, chronic respiratory failure on 2 L oxygen, PE, and DVT of right lower extremity status post IVC filter on anticoagulation, sleep apnea, GERD, fibromyalgia referred to [facility] for history of history of DVT.   First episode: In the 1990s with OCP Second episode: 2000, unprovoked Third episode: 2025 holding anticoagulation for hematoma and with diverticulitis, underlying IVCF. Treatment: Xarelto   We discussed the risk factors for DVT and PE today.  She also has several family members of blood clot.  Report her parents both have blood clot, daughter had blood clot as well.  She is interesting obtain thrombophilia testing.  We discussed moving forward, she does have upcoming surgery including right knee replacement.  This is a risk of future blood clot.  She also has other risk factor including personal history, family history, BMI over 30, sedentary in the near future.  She also has a IVC filter in place.  Reasonable to continue anticoagulation as tolerated.  She has no apparent history of CVA, MI or arterial disease.  Therefore, avoid antiplatelet and NSAIDs is reasonable.  Discussed risk of bleeding.  Discussed bleeding precautions and avoid high risk activities for falling while on anticoagulation. Anticoagulants do not cause bleeding.  Rather, if bleeding occurs when one is on anticoagulation, it may take longer for the bleeding to stop due to reduce coagulation capacity.  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   Orders Placed This Encounter  Procedures   CBC with Differential (Cancer Center Only)    Standing Status:   Future    Expiration Date:   08/07/2025   Basic Metabolic Panel - Cancer Center Only    Standing Status:   Future    Expiration Date:   08/07/2025   Antithrombin panel    Standing Status:   Future    Expiration Date:   09/07/2024   PROTEIN S PANEL    Standing Status:   Future    Expiration Date:   08/07/2025   Protein C, total    Standing Status:   Future    Expiration Date:   08/07/2025   Lupus anticoagulant panel    Standing Status:   Future    Expiration Date:   08/07/2025   Prothrombin gene mutation    Standing Status:   Future    Expiration Date:   08/07/2025   Cardiolipin antibodies, IgM+IgG    Standing Status:   Future    Expiration Date:   08/07/2025   Beta-2 -glycoprotein i abs, IgG/M/A    Standing Status:   Future    Expiration Date:   08/07/2025   Factor 5 leiden    Standing Status:   Future    Expiration Date:   08/07/2025   Norma JAYSON Chihuahua, MD 8/25/20251:03 PM  CHIEF COMPLAINTS/PURPOSE OF CONSULTATION:  Recurrent DVT  HISTORY OF PRESENTING ILLNESS:  Norma Anthony 68 y.o. female is here because of current DVT  Patient had hospitalization for acute diverticulitis, mesenteric hematoma in April 2025.  Poor worsening DVT in the setting of holding anticoagulation.  She was placed back  on Xarelto .  Report her first PE was long time about about 30 years ago. She was not on anticoagluation.  The 2nd time was during COVID with RLE DVT and was placed on Xarelto .  04/04/2012 CT from [facility] show negative for PE.  08/27/2022 ultrasound right lower extremity showed no evidence of DVT 09/07/2022 ultrasound bilateral lower extremity showed: Chronic DVT involving the right femoral vein, right popliteal vein. no evidence of DVT over left lower extremity  Patient was planned for total knee arthroplasty and plan for IVC filter temporary and removal  after 78-month 09/28/2022 IVC filter insertion.  12/17/22 ultrasound bilateral lower extremity showed chronic DVT involving the right popliteal vein.  No DVT on the left.  01/18/23 IVC filter removal  09/04/23 CTA for PE: No evidence of PE.  03/29/24 resenting with diverticulitis CT AP: Stable infrarenal IVC filter.  No DVT reported 04/26/24 CT AP: Nonocclusive DVT developed in the interim in the left common iliac vein extending into the infrahepatic IVC.  Similarly positioned IVC filter, apex just below the insertion of left renal vein  06/02/24 CT AP: IVC filter with small amount of thrombus distal to the filter.  She will need a surgery for right knee replacement.  This has been scheduled.  She has multiple surgeries on the left knee in the past.  She used to hold anticoagulation 3 days before surgery and resume afterwards.  She denies any issues or complications in the past.  MEDICAL HISTORY:  Past Medical History:  Diagnosis Date   Acute respiratory failure with hypoxia (HCC) 03/23/2022   Acute respiratory failure with hypoxia (HCC) 03/23/2022   Anemia    Anxiety    Arthritis    CAD (coronary artery disease)    pt deneies   COPD (chronic obstructive pulmonary disease) (HCC)    Depression    Dyspnea    Fibromyalgia    Generalized anxiety disorder 12/11/2022   GERD (gastroesophageal reflux disease)    Hiatal hernia    History of kidney stones    Lung nodule 03/24/2022   Migraine headache    Multinodular goiter    Nephrolithiasis    Obstructive sleep apnea syndrome 07/01/2021   Opioid dependence (HCC) 03/23/2022   Pneumonia    Pulmonary embolism (HCC) 1996   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   Sleep apnea    Thyroid  nodule     SURGICAL HISTORY: Past Surgical History:  Procedure Laterality Date   ABDOMINAL HYSTERECTOMY      BIOPSY THYROID      EXCISIONAL TOTAL KNEE ARTHROPLASTY WITH ANTIBIOTIC SPACERS Left 03/10/2023   Procedure: Irrigation and debridement left knee, resection total knee arthroplasty, antibiotic spacer placement;  Surgeon: Fidel Rogue, MD;  Location: [facility];  Service: Orthopedics;  Laterality: Left;   HERNIA REPAIR     I & D EXTREMITY Left 12/11/2022   Procedure: IRRIGATION AND DEBRIDEMENT EXTREMITY;  Surgeon: Sharl Selinda Dover, MD;  Location: [facility];  Service: Orthopedics;  Laterality: Left;   IVC FILTER INSERTION N/A 09/28/2022   Procedure: IVC FILTER INSERTION;  Surgeon: Sheree Penne Bruckner, MD;  Location: [facility] INVASIVE CV LAB;  Service: Cardiovascular;  Laterality: N/A;   IVC FILTER INSERTION N/A 08/09/2023   Procedure: IVC FILTER INSERTION;  Surgeon: Sheree Penne Bruckner, MD;  Location: [facility] INVASIVE CV LAB;  Service: Cardiovascular;  Laterality: N/A;   IVC  FILTER REMOVAL N/A 01/18/2023   Procedure: IVC FILTER REMOVAL;  Surgeon: Sheree Penne Bruckner, MD;  Location: [facility] INVASIVE CV LAB;  Service: Cardiovascular;  Laterality: N/A;   KIDNEY STONE SURGERY     KNEE ARTHROTOMY Left 02/03/2023   Procedure: KNEE ARTHROTOMY WITH SOFT TISSUE REPAIR;  Surgeon: Fidel Rogue, MD;  Location: [facility];  Service: Orthopedics;  Laterality: Left;  75   LAPAROSCOPIC NISSEN FUNDOPLICATION     PAROTIDECTOMY     REMOVAL OF CEMENTED SPACER KNEE Left 08/25/2023   Procedure: Knee spacer removal;  Surgeon: Fidel Rogue, MD;  Location: [facility];  Service: Orthopedics;  Laterality: Left;  350   TONSILLECTOMY     TOTAL KNEE ARTHROPLASTY Left 11/04/2022   Procedure: TOTAL KNEE ARTHROPLASTY;  Surgeon: Duwayne Purchase, MD;  Location: [facility];  Service: Orthopedics;  Laterality: Left;   TOTAL KNEE REVISION Left 08/25/2023   Procedure: TOTAL KNEE REVISION;  Surgeon: Fidel Rogue, MD;  Location: [facility];  Service: Orthopedics;  Laterality: Left;  350    SOCIAL HISTORY: Social History   Socioeconomic History    Marital status: Married    Spouse name: Not on file   Number of children: Not on file   Years of education: Not on file   Highest education level: Not on file  Occupational History   Not on file  Tobacco Use   Smoking status: Former    Current packs/day: 0.00    Average packs/day: 0.5 packs/day for 10.0 years (5.0 ttl pk-yrs)    Types: Cigarettes    Start date: 02/13/2011    Quit date: 02/12/2021    Years since quitting: 3.4   Smokeless tobacco: Never  Vaping Use   Vaping status: Never Used  Substance and Sexual Activity   Alcohol  use: Not Currently   Drug use: Not Currently   Sexual activity: Not Currently  Other Topics Concern   Not on file  Social History Narrative   Not on file   Social Drivers of Health   Financial Resource Strain: Low Risk  (04/23/2021)   Received from [facility]   Overall Financial Resource Strain (CARDIA)    Difficulty of Paying Living Expenses: Not hard at all  Food Insecurity: No Food Insecurity (06/02/2024)   Hunger Vital Sign    Worried About Running Out of Food in the Last Year: Never true    Ran Out of Food in the Last Year: Never true  Transportation Needs: No Transportation Needs (06/02/2024)   PRAPARE - Administrator, Civil Service (Medical): No    Lack of Transportation (Non-Medical): No  Physical Activity: Not on file  Stress: Stress Concern Present (04/23/2021)   Received from [facility] - Occupational Stress Questionnaire    Feeling of Stress : To some extent  Social Connections: Moderately Integrated (06/02/2024)   Social Connection and Isolation Panel    Frequency of Communication with Friends and Family: Twice a week    Frequency of Social Gatherings with Friends and Family: More than three times a week    Attends Religious Services: Never    Database administrator or Organizations: Yes    Attends Banker Meetings: Never    Marital Status: Married   Catering manager Violence: Not At Risk (06/02/2024)   Humiliation, Afraid, Rape, and Kick questionnaire    Fear of Current or Ex-Partner: No    Emotionally Abused: No    Physically Abused: No  Sexually Abused: No    FAMILY HISTORY: Family History  Problem Relation Age of Onset   Hypertension Mother     ALLERGIES:  is allergic to belbuca  [buprenorphine  hcl], pregabalin, compazine  [prochlorperazine ], droperidol, and ergotamine-caffeine.  MEDICATIONS:  Current Outpatient Medications  Medication Sig Dispense Refill   albuterol  (VENTOLIN  HFA) 108 (90 Base) MCG/ACT inhaler Inhale 2 puffs into the lungs every 6 (six) hours as needed for wheezing or shortness of breath.     ALPRAZolam  (XANAX ) 1 MG tablet Take 1 tablet (1 mg total) by mouth every 6 (six) hours as needed for anxiety or sleep. 30 tablet 0   ascorbic acid  (VITAMIN C ) 1000 MG tablet Take 1,000 mg by mouth daily.     b complex vitamins capsule Take 1 capsule by mouth daily.     cephALEXin  (KEFLEX ) 500 MG capsule Take 500 mg by mouth 2 (two) times daily.     clotrimazole -betamethasone  (LOTRISONE ) cream Apply 1 Application topically 2 (two) times daily as needed (for irritation under the breasts or abdominal folds).     DULoxetine  (CYMBALTA ) 60 MG capsule Take 60 mg by mouth at bedtime.     famotidine  (PEPCID ) 20 MG tablet TAKE 1 TABLET BY MOUTH AFTER SUPPER 30 tablet 0   Ferrous Fumarate -Folic Acid  324-1 MG TABS Take 1 tablet by mouth daily. Hematinic     fluconazole  (DIFLUCAN ) 200 MG tablet Take 200 mg by mouth daily.     gabapentin  (NEURONTIN ) 400 MG capsule Take 400 mg by mouth 2 (two) times daily.     HYDROmorphone  (DILAUDID ) 4 MG tablet Take 1 tablet 4 times a day by oral route as needed for 30 days.     naloxone  (NARCAN ) nasal spray 4 mg/0.1 mL Place 1 spray into the nose once as needed (accidental over dose).     nutrition supplement, JUVEN, (JUVEN) PACK Take 1 packet by mouth 2 (two) times daily between meals.      ondansetron  (ZOFRAN ) 4 MG tablet Take 1 tablet (4 mg total) by mouth every 8 (eight) hours as needed for nausea or vomiting. 30 tablet 0   pantoprazole  (PROTONIX ) 40 MG tablet Take 40 mg by mouth daily with breakfast.     promethazine  (PHENERGAN ) 25 MG tablet Take 25 mg by mouth every 6 (six) hours as needed for nausea or vomiting.     tiZANidine  (ZANAFLEX ) 2 MG tablet Take 2 mg by mouth every 12 (twelve) hours as needed for muscle spasms.     XARELTO  20 MG TABS tablet Take 20 mg by mouth daily with supper.     No current facility-administered medications for this visit.    REVIEW OF SYSTEMS:   All relevant systems were reviewed with the patient and are negative.  PHYSICAL EXAMINATION:  Vitals:   08/07/24 1203  BP: 123/65  Pulse: 74  Resp: 18  Temp: 98.1 F (36.7 C)  SpO2: 98%   Filed Weights   08/07/24 1203  Weight: 231 lb (104.8 kg)    GENERAL: alert, no distress and comfortable on wheelchair SKIN: skin color normal LUNGS: Effort normal  Musculoskeletal: Bilateral lower extremity edema.  LABORATORY DATA:  I have reviewed the data as listed   RADIOGRAPHIC STUDIES: I have personally reviewed the radiological images as listed and agreed with the findings in the report.

## 2024-08-08 ENCOUNTER — Encounter: Payer: Self-pay | Admitting: Internal Medicine

## 2024-08-09 ENCOUNTER — Telehealth: Payer: Self-pay

## 2024-08-09 LAB — LUPUS ANTICOAGULANT PANEL
DRVVT: 57.5 s — ABNORMAL HIGH (ref 0.0–47.0)
PTT Lupus Anticoagulant: 35.4 s (ref 0.0–43.5)

## 2024-08-09 LAB — CARDIOLIPIN ANTIBODIES, IGM+IGG
Anticardiolipin IgG: <9 GPL U/mL (ref 0–14)
Anticardiolipin IgM: <9 [MPL'U]/mL (ref 0–12)

## 2024-08-09 LAB — PROTEIN S PANEL
Protein S Activity: 63 % (ref 63–140)
Protein S Ag, Free: 103 % (ref 61–136)
Protein S Ag, Total: 113 % (ref 60–150)

## 2024-08-09 LAB — BETA-2-GLYCOPROTEIN I ABS, IGG/M/A
Beta-2 Glyco I IgG: <9 GPI IgG units (ref 0–20)
Beta-2-Glycoprotein I IgA: <9 GPI IgA units (ref 0–25)
Beta-2-Glycoprotein I IgM: <9 GPI IgM units (ref 0–32)

## 2024-08-09 LAB — ANTITHROMBIN PANEL
AT III AG PPP IMM-ACNC: 99 % (ref 72–124)
Antithrombin Activity: 116 % (ref 75–135)

## 2024-08-09 LAB — PROTEIN C, TOTAL: Protein C, Total: 114 % (ref 60–150)

## 2024-08-09 LAB — DRVVT MIX: dRVVT Mix: 44.9 s — ABNORMAL HIGH (ref 0.0–40.4)

## 2024-08-09 LAB — DRVVT CONFIRM: dRVVT Confirm: 1.2 ratio (ref 0.8–1.2)

## 2024-08-09 NOTE — Telephone Encounter (Signed)
 Scheduled appointment per 8/25 los. Talked with the patient and she is aware of the made appointment.

## 2024-08-09 NOTE — Progress Notes (Signed)
 Sent message, via epic in basket, requesting orders in epic from Careers adviser.

## 2024-08-10 LAB — PROTHROMBIN GENE MUTATION

## 2024-08-10 NOTE — Patient Instructions (Signed)
 SURGICAL WAITING ROOM VISITATION Patients having surgery or a procedure may have no more than 2 support people in the waiting area - these visitors may rotate in the visitor waiting room.   Due to an increase in RSV and influenza rates and associated hospitalizations, children ages 35 and under may not visit patients in [facility] hospitals. If the patient needs to stay at the hospital during part of their recovery, the visitor guidelines for inpatient rooms apply.  PRE-OP VISITATION  Pre-op nurse will coordinate an appropriate time for 1 support person to accompany the patient in pre-op.  This support person may not rotate.  This visitor will be contacted when the time is appropriate for the visitor to come back in the pre-op area.  Please refer to the [facility] website for the visitor guidelines for Inpatients (after your surgery is over and you are in a regular room).  You are not required to quarantine at this time prior to your surgery. However, you must do this: Hand Hygiene often Do NOT share personal items Notify your provider if you are in close contact with someone who has COVID or you develop fever 100.4 or greater, new onset of sneezing, cough, sore throat, shortness of breath or body aches.  If you test positive for Covid or have been in contact with anyone that has tested positive in the last 10 days please notify you surgeon.    Your procedure is scheduled on: 08/17/24   Report to [facility] Main Entrance: [place] entrance where the [employer] is available.   Report to admitting at: 8:40 AM  Call this number if you have any questions or problems the morning of surgery [phone]  FOLLOW ANY ADDITIONAL PRE OP INSTRUCTIONS YOU RECEIVED FROM YOUR SURGEON'S OFFICE!!!  Do not eat food after Midnight the night prior to your surgery/procedure.  After Midnight you may have the following liquids until : 8:00 AM DAY OF SURGERY  Clear Liquid Diet Water  Black  Coffee (sugar ok, NO MILK/CREAM OR CREAMERS)  Tea (sugar ok, NO MILK/CREAM OR CREAMERS) regular and decaf                             Plain Jell-O  with no fruit (NO RED)                                           Fruit ices (not with fruit pulp, NO RED)                                     Popsicles (NO RED)                                                                  Juice: NO CITRUS JUICES: only apple, WHITE grape, WHITE cranberry Sports drinks like Gatorade or Powerade (NO RED)   The day of surgery:  Drink ONE (1) Pre-Surgery Clear Ensure at: 8:00 AM the morning of surgery. Drink in one sitting. Do not sip.  This drink was given  to you during your hospital pre-op appointment visit. Nothing else to drink after completing the Pre-Surgery Clear Ensure or G2 : No candy, chewing gum or throat lozenges.    Oral Hygiene is also important to reduce your risk of infection.        Remember - BRUSH YOUR TEETH THE MORNING OF SURGERY WITH YOUR REGULAR TOOTHPASTE  Do NOT smoke after Midnight the night before surgery.  STOP TAKING all Vitamins, Herbs and supplements 1 week before your surgery.   Take ONLY these medicines the morning of surgery with A SIP OF WATER : gabapentin ,cephalexin .Alprazolam  as needed.Use inhalers as usual and bring them.  If You have been diagnosed with Sleep Apnea - Bring CPAP mask and tubing day of surgery. We will provide you with a CPAP machine on the day of your surgery.                   You may not have any metal on your body including hair pins, jewelry, and body piercing  Do not wear make-up, lotions, powders, perfumes / cologne, or deodorant  Do not wear nail polish including gel and S&S, artificial / acrylic nails, or any other type of covering on natural nails including finger and toenails. If you have artificial nails, gel coating, etc., that needs to be removed by a nail salon, Please have this removed prior to surgery. Not doing so may mean that your surgery  could be cancelled or delayed if the Surgeon or anesthesia staff feels like they are unable to monitor you safely.   Do not shave 48 hours prior to surgery to avoid nicks in your skin which may contribute to postoperative infections.   Contacts, Hearing Aids, dentures or bridgework may not be worn into surgery. DENTURES WILL BE REMOVED PRIOR TO SURGERY PLEASE DO NOT APPLY Poly grip OR ADHESIVES!!!  You may bring a small overnight bag with you on the day of surgery, only pack items that are not valuable. [facility] IS NOT RESPONSIBLE   FOR VALUABLES THAT ARE LOST OR STOLEN.   Patients discharged on the day of surgery will not be allowed to drive home.  Someone NEEDS to stay with you for the first 24 hours after anesthesia.  Do not bring your home medications to the hospital. The Pharmacy will dispense medications listed on your medication list to you during your admission in the Hospital.  Special Instructions: Bring a copy of your healthcare power of attorney and living will documents the day of surgery, if you wish to have them scanned into your [facility] Records- EPIC  Please read over the following fact sheets you were given: IF YOU HAVE QUESTIONS ABOUT YOUR PRE-OP INSTRUCTIONS, PLEASE CALL [phone]  PATIENT SIGNATURE_________________________________  NURSE SIGNATURE__________________________________  ________________________________________________________________________    Pre-operative 5 CHG Bath Instructions   You can play a key role in reducing the risk of infection after surgery. Your skin needs to be as free of germs as possible. You can reduce the number of germs on your skin by washing with CHG (chlorhexidine  gluconate) soap before surgery. CHG is an antiseptic soap that kills germs and continues to kill germs even after washing.   DO NOT use if you have an allergy to chlorhexidine /CHG or antibacterial soaps. If your skin becomes reddened or irritated, stop  using the CHG and notify one of our RNs at [phone].   Please shower with the CHG soap starting 4 days before surgery using the following schedule:  Please keep in mind the following:  DO NOT shave, including legs and underarms, starting the day of your first shower.   You may shave your face at any point before/day of surgery.  Place clean sheets on your bed the day you start using CHG soap. Use a clean washcloth (not used since being washed) for each shower. DO NOT sleep with pets once you start using the CHG.   CHG Shower Instructions:  If you choose to wash your hair and private area, wash first with your normal shampoo/soap.  After you use shampoo/soap, rinse your hair and body thoroughly to remove shampoo/soap residue.  Turn the water  OFF and apply about 3 tablespoons (45 ml) of CHG soap to a CLEAN washcloth.  Apply CHG soap ONLY FROM YOUR NECK DOWN TO YOUR TOES (washing for 3-5 minutes)  DO NOT use CHG soap on face, private areas, open wounds, or sores.  Pay special attention to the area where your surgery is being performed.  If you are having back surgery, having someone wash your back for you may be helpful. Wait 2 minutes after CHG soap is applied, then you may rinse off the CHG soap.  Pat dry with a clean towel  Put on clean clothes/pajamas   If you choose to wear lotion, please use ONLY the CHG-compatible lotions on the back of this paper.     Additional instructions for the day of surgery: DO NOT APPLY any lotions, deodorants, cologne, or perfumes.   Put on clean/comfortable clothes.  Brush your teeth.  Ask your nurse before applying any prescription medications to the skin.   CHG Compatible Lotions   Aveeno Moisturizing lotion  Cetaphil Moisturizing Cream  Cetaphil Moisturizing Lotion  Clairol Herbal Essence Moisturizing Lotion, Dry Skin  Clairol Herbal Essence Moisturizing Lotion, Extra Dry Skin  Clairol Herbal Essence Moisturizing Lotion, Normal Skin   Curel Age Defying Therapeutic Moisturizing Lotion with Alpha Hydroxy  Curel Extreme Care Body Lotion  Curel Soothing Hands Moisturizing Hand Lotion  Curel Therapeutic Moisturizing Cream, Fragrance-Free  Curel Therapeutic Moisturizing Lotion, Fragrance-Free  Curel Therapeutic Moisturizing Lotion, Original Formula  Eucerin Daily Replenishing Lotion  Eucerin Dry Skin Therapy Plus Alpha Hydroxy Crme  Eucerin Dry Skin Therapy Plus Alpha Hydroxy Lotion  Eucerin Original Crme  Eucerin Original Lotion  Eucerin Plus Crme Eucerin Plus Lotion  Eucerin TriLipid Replenishing Lotion  Keri Anti-Bacterial Hand Lotion  Keri Deep Conditioning Original Lotion Dry Skin Formula Softly Scented  Keri Deep Conditioning Original Lotion, Fragrance Free Sensitive Skin Formula  Keri Lotion Fast Absorbing Fragrance Free Sensitive Skin Formula  Keri Lotion Fast Absorbing Softly Scented Dry Skin Formula  Keri Original Lotion  Keri Skin Renewal Lotion Keri Silky Smooth Lotion  Keri Silky Smooth Sensitive Skin Lotion  Nivea Body Creamy Conditioning Oil  Nivea Body Extra Enriched Lotion  Nivea Body Original Lotion  Nivea Body Sheer Moisturizing Lotion Nivea Crme  Nivea Skin Firming Lotion  NutraDerm 30 Skin Lotion  NutraDerm Skin Lotion  NutraDerm Therapeutic Skin Cream  NutraDerm Therapeutic Skin Lotion  ProShield Protective Hand Cream  Provon moisturizing lotion   Incentive Spirometer  An incentive spirometer is a tool that can help keep your lungs clear and active. This tool measures how well you are filling your lungs with each breath. Taking long deep breaths may help reverse or decrease the chance of developing breathing (pulmonary) problems (especially infection) following: A long period of time when you are unable to move or be active. BEFORE THE PROCEDURE  If the spirometer includes an indicator to show your best effort, your nurse or respiratory therapist will set it to a desired goal. If  possible, sit up straight or lean slightly forward. Try not to slouch. Hold the incentive spirometer in an upright position. INSTRUCTIONS FOR USE  Sit on the edge of your bed if possible, or sit up as far as you can in bed or on a chair. Hold the incentive spirometer in an upright position. Breathe out normally. Place the mouthpiece in your mouth and seal your lips tightly around it. Breathe in slowly and as deeply as possible, raising the piston or the ball toward the top of the column. Hold your breath for 3-5 seconds or for as long as possible. Allow the piston or ball to fall to the bottom of the column. Remove the mouthpiece from your mouth and breathe out normally. Rest for a few seconds and repeat Steps 1 through 7 at least 10 times every 1-2 hours when you are awake. Take your time and take a few normal breaths between deep breaths. The spirometer may include an indicator to show your best effort. Use the indicator as a goal to work toward during each repetition. After each set of 10 deep breaths, practice coughing to be sure your lungs are clear. If you have an incision (the cut made at the time of surgery), support your incision when coughing by placing a pillow or rolled up towels firmly against it. Once you are able to get out of bed, walk around indoors and cough well. You may stop using the incentive spirometer when instructed by your caregiver.  RISKS AND COMPLICATIONS Take your time so you do not get dizzy or light-headed. If you are in pain, you may need to take or ask for pain medication before doing incentive spirometry. It is harder to take a deep breath if you are having pain. AFTER USE Rest and breathe slowly and easily. It can be helpful to keep track of a log of your progress. Your caregiver can provide you with a simple table to help with this. If you are using the spirometer at home, follow these instructions: SEEK MEDICAL CARE IF:  You are having difficultly using the  spirometer. You have trouble using the spirometer as often as instructed. Your pain medication is not giving enough relief while using the spirometer. You develop fever of 100.5 F (38.1 C) or higher. SEEK IMMEDIATE MEDICAL CARE IF:  You cough up bloody sputum that had not been present before. You develop fever of 102 F (38.9 C) or greater. You develop worsening pain at or near the incision site. MAKE SURE YOU:  Understand these instructions. Will watch your condition. Will get help right away if you are not doing well or get worse. Document Released: 04/12/2007 Document Revised: 02/22/2012 Document Reviewed: 06/13/2007 [facility] Patient Information 2014 [place], [place].   ________________________________________________________________________

## 2024-08-11 ENCOUNTER — Other Ambulatory Visit: Payer: Self-pay

## 2024-08-11 ENCOUNTER — Emergency Department (HOSPITAL_COMMUNITY)

## 2024-08-11 ENCOUNTER — Encounter (HOSPITAL_COMMUNITY)
Admission: RE | Admit: 2024-08-11 | Discharge: 2024-08-11 | Disposition: A | Discharge status: Home / Self Care | Source: Ambulatory Visit | Attending: Orthopedic Surgery | Admitting: Orthopedic Surgery

## 2024-08-11 ENCOUNTER — Encounter (HOSPITAL_COMMUNITY): Payer: Self-pay | Admitting: Physician Assistant

## 2024-08-11 ENCOUNTER — Inpatient Hospital Stay (HOSPITAL_COMMUNITY)
Admission: EM | Admit: 2024-08-11 | Discharge: 2024-08-15 | DRG: 871 | Disposition: A | Discharge status: Home / Self Care | Attending: Internal Medicine | Admitting: Internal Medicine

## 2024-08-11 ENCOUNTER — Ambulatory Visit: Payer: Self-pay

## 2024-08-11 VITALS — BP 150/108 | HR 127 | Temp 102.7°F | Ht 65.0 in | Wt 225.0 lb

## 2024-08-11 DIAGNOSIS — Z888 Allergy status to other drugs, medicaments and biological substances status: Secondary | ICD-10-CM | POA: Diagnosis not present

## 2024-08-11 DIAGNOSIS — Z1152 Encounter for screening for COVID-19: Secondary | ICD-10-CM | POA: Diagnosis not present

## 2024-08-11 DIAGNOSIS — Z6837 Body mass index (BMI) 37.0-37.9, adult: Secondary | ICD-10-CM | POA: Diagnosis not present

## 2024-08-11 DIAGNOSIS — Z95828 Presence of other vascular implants and grafts: Secondary | ICD-10-CM

## 2024-08-11 DIAGNOSIS — Z01818 Encounter for other preprocedural examination: Secondary | ICD-10-CM | POA: Insufficient documentation

## 2024-08-11 DIAGNOSIS — R531 Weakness: Secondary | ICD-10-CM

## 2024-08-11 DIAGNOSIS — Z86711 Personal history of pulmonary embolism: Secondary | ICD-10-CM | POA: Diagnosis not present

## 2024-08-11 DIAGNOSIS — Z90710 Acquired absence of both cervix and uterus: Secondary | ICD-10-CM | POA: Diagnosis not present

## 2024-08-11 DIAGNOSIS — Z87891 Personal history of nicotine dependence: Secondary | ICD-10-CM | POA: Diagnosis not present

## 2024-08-11 DIAGNOSIS — A419 Sepsis, unspecified organism: Secondary | ICD-10-CM | POA: Diagnosis present

## 2024-08-11 DIAGNOSIS — Z886 Allergy status to analgesic agent status: Secondary | ICD-10-CM

## 2024-08-11 DIAGNOSIS — Z7901 Long term (current) use of anticoagulants: Secondary | ICD-10-CM

## 2024-08-11 DIAGNOSIS — J440 Chronic obstructive pulmonary disease with acute lower respiratory infection: Secondary | ICD-10-CM | POA: Diagnosis present

## 2024-08-11 DIAGNOSIS — Z86718 Personal history of other venous thrombosis and embolism: Secondary | ICD-10-CM | POA: Diagnosis not present

## 2024-08-11 DIAGNOSIS — M797 Fibromyalgia: Secondary | ICD-10-CM | POA: Diagnosis present

## 2024-08-11 DIAGNOSIS — J189 Pneumonia, unspecified organism: Secondary | ICD-10-CM | POA: Diagnosis present

## 2024-08-11 DIAGNOSIS — J13 Pneumonia due to Streptococcus pneumoniae: Secondary | ICD-10-CM | POA: Diagnosis not present

## 2024-08-11 DIAGNOSIS — Z79899 Other long term (current) drug therapy: Secondary | ICD-10-CM | POA: Diagnosis not present

## 2024-08-11 DIAGNOSIS — Z8249 Family history of ischemic heart disease and other diseases of the circulatory system: Secondary | ICD-10-CM

## 2024-08-11 DIAGNOSIS — G4730 Sleep apnea, unspecified: Secondary | ICD-10-CM | POA: Diagnosis present

## 2024-08-11 DIAGNOSIS — G894 Chronic pain syndrome: Secondary | ICD-10-CM | POA: Diagnosis present

## 2024-08-11 DIAGNOSIS — K5900 Constipation, unspecified: Secondary | ICD-10-CM | POA: Diagnosis present

## 2024-08-11 DIAGNOSIS — I2510 Atherosclerotic heart disease of native coronary artery without angina pectoris: Secondary | ICD-10-CM | POA: Diagnosis present

## 2024-08-11 DIAGNOSIS — E66812 Obesity, class 2: Secondary | ICD-10-CM | POA: Diagnosis present

## 2024-08-11 DIAGNOSIS — J9621 Acute and chronic respiratory failure with hypoxia: Secondary | ICD-10-CM | POA: Diagnosis present

## 2024-08-11 DIAGNOSIS — Z8619 Personal history of other infectious and parasitic diseases: Secondary | ICD-10-CM

## 2024-08-11 DIAGNOSIS — Z96653 Presence of artificial knee joint, bilateral: Secondary | ICD-10-CM | POA: Diagnosis present

## 2024-08-11 DIAGNOSIS — R0902 Hypoxemia: Secondary | ICD-10-CM

## 2024-08-11 LAB — CBC WITH DIFFERENTIAL/PLATELET
Abs Immature Granulocytes: 0.05 K/uL (ref 0.00–0.07)
Basophils Absolute: 0 K/uL (ref 0.0–0.1)
Basophils Relative: 0 %
Eosinophils Absolute: 0.3 K/uL (ref 0.0–0.5)
Eosinophils Relative: 2 %
HCT: 50.6 % — ABNORMAL HIGH (ref 36.0–46.0)
Hemoglobin: 15.4 g/dL — ABNORMAL HIGH (ref 12.0–15.0)
Immature Granulocytes: 0 %
Lymphocytes Relative: 12 %
Lymphs Abs: 1.6 K/uL (ref 0.7–4.0)
MCH: 30.2 pg (ref 26.0–34.0)
MCHC: 30.4 g/dL (ref 30.0–36.0)
MCV: 99.2 fL (ref 80.0–100.0)
Monocytes Absolute: 0.7 K/uL (ref 0.1–1.0)
Monocytes Relative: 5 %
Neutro Abs: 11.4 K/uL — ABNORMAL HIGH (ref 1.7–7.7)
Neutrophils Relative %: 81 %
Platelets: 193 K/uL (ref 150–400)
RBC: 5.1 MIL/uL (ref 3.87–5.11)
RDW: 15.1 % (ref 11.5–15.5)
WBC: 14.2 K/uL — ABNORMAL HIGH (ref 4.0–10.5)
nRBC: 0 % (ref 0.0–0.2)

## 2024-08-11 LAB — COMPREHENSIVE METABOLIC PANEL WITH GFR
ALT: 46 U/L — ABNORMAL HIGH (ref 0–44)
AST: 38 U/L (ref 15–41)
Albumin: 4.1 g/dL (ref 3.5–5.0)
Alkaline Phosphatase: 122 U/L (ref 38–126)
Anion gap: 13 (ref 5–15)
BUN: 12 mg/dL (ref 8–23)
CO2: 25 mmol/L (ref 22–32)
Calcium: 9.5 mg/dL (ref 8.9–10.3)
Chloride: 101 mmol/L (ref 98–111)
Creatinine, Ser: 0.93 mg/dL (ref 0.44–1.00)
GFR, Estimated: >60 mL/min (ref >60)
Glucose, Bld: 103 mg/dL — ABNORMAL HIGH (ref 70–99)
Potassium: 4.1 mmol/L (ref 3.5–5.1)
Sodium: 140 mmol/L (ref 135–145)
Total Bilirubin: 0.4 mg/dL (ref 0.0–1.2)
Total Protein: 7.7 g/dL (ref 6.5–8.1)

## 2024-08-11 LAB — URINALYSIS, W/ REFLEX TO CULTURE (INFECTION SUSPECTED)
Bacteria, UA: NONE SEEN
Bilirubin Urine: NEGATIVE
Glucose, UA: NEGATIVE mg/dL
Hgb urine dipstick: NEGATIVE
Ketones, ur: NEGATIVE mg/dL
Leukocytes,Ua: NEGATIVE
Nitrite: NEGATIVE
Protein, ur: NEGATIVE mg/dL
Specific Gravity, Urine: >1.046 — ABNORMAL HIGH (ref 1.005–1.030)
pH: 5 (ref 5.0–8.0)

## 2024-08-11 LAB — FACTOR 5 LEIDEN

## 2024-08-11 LAB — BLOOD GAS, VENOUS
Acid-Base Excess: 5.6 mmol/L — ABNORMAL HIGH (ref 0.0–2.0)
Bicarbonate: 33.7 mmol/L — ABNORMAL HIGH (ref 20.0–28.0)
O2 Saturation: 37.1 %
Patient temperature: 37
pCO2, Ven: 64 mmHg — ABNORMAL HIGH (ref 44–60)
pH, Ven: 7.33 (ref 7.25–7.43)
pO2, Ven: <31 mmHg — CL (ref 32–45)

## 2024-08-11 LAB — PROTIME-INR
INR: 1.6 — ABNORMAL HIGH (ref 0.8–1.2)
Prothrombin Time: 19.5 s — ABNORMAL HIGH (ref 11.4–15.2)

## 2024-08-11 LAB — RESP PANEL BY RT-PCR (RSV, FLU A&B, COVID)  RVPGX2
Influenza A by PCR: NEGATIVE
Influenza B by PCR: NEGATIVE
Resp Syncytial Virus by PCR: NEGATIVE
SARS Coronavirus 2 by RT PCR: NEGATIVE

## 2024-08-11 LAB — LIPASE, BLOOD: Lipase: 10 U/L — ABNORMAL LOW (ref 11–51)

## 2024-08-11 LAB — SURGICAL PCR SCREEN
MRSA, PCR: NEGATIVE
Staphylococcus aureus: NEGATIVE

## 2024-08-11 LAB — I-STAT CG4 LACTIC ACID, ED: Lactic Acid, Venous: 1.2 mmol/L (ref 0.5–1.9)

## 2024-08-11 LAB — STREP PNEUMONIAE URINARY ANTIGEN: Strep Pneumo Urinary Antigen: NEGATIVE

## 2024-08-11 MED ORDER — HYDROMORPHONE HCL 1 MG/ML IJ SOLN
1.0000 mg | Freq: Once | INTRAMUSCULAR | Status: AC
  Administered 2024-08-11: 1 mg via INTRAVENOUS
  Filled 2024-08-11: qty 1

## 2024-08-11 MED ORDER — POLYETHYLENE GLYCOL 3350 17 G PO PACK
17.0000 g | PACK | Freq: Every day | ORAL | Status: DC
  Administered 2024-08-11 – 2024-08-14 (×4): 17 g via ORAL
  Filled 2024-08-11 (×4): qty 1

## 2024-08-11 MED ORDER — HYDRALAZINE HCL 20 MG/ML IJ SOLN: 10.0000 mg | Freq: Four times a day (QID) | INTRAMUSCULAR | Status: DC | PRN

## 2024-08-11 MED ORDER — TIZANIDINE HCL 4 MG PO TABS
2.0000 mg | ORAL_TABLET | Freq: Two times a day (BID) | ORAL | Status: DC | PRN
  Administered 2024-08-12 – 2024-08-15 (×4): 2 mg via ORAL
  Filled 2024-08-11 (×4): qty 1

## 2024-08-11 MED ORDER — RIVAROXABAN 20 MG PO TABS
20.0000 mg | ORAL_TABLET | Freq: Every day | ORAL | Status: DC
  Administered 2024-08-12: 20 mg via ORAL
  Filled 2024-08-11: qty 1

## 2024-08-11 MED ORDER — GUAIFENESIN 100 MG/5ML PO LIQD: 5.0000 mL | Freq: Four times a day (QID) | ORAL | Status: DC | PRN

## 2024-08-11 MED ORDER — POLYETHYLENE GLYCOL 3350 17 G PO PACK: 17.0000 g | PACK | Freq: Every day | ORAL | Status: DC | PRN

## 2024-08-11 MED ORDER — PANTOPRAZOLE SODIUM 40 MG PO TBEC
40.0000 mg | DELAYED_RELEASE_TABLET | Freq: Every day | ORAL | Status: DC
  Administered 2024-08-12 – 2024-08-15 (×4): 40 mg via ORAL
  Filled 2024-08-11 (×4): qty 1

## 2024-08-11 MED ORDER — SODIUM CHLORIDE 0.9 % IV SOLN
1.0000 g | INTRAVENOUS | Status: DC
  Administered 2024-08-12 – 2024-08-15 (×4): 1 g via INTRAVENOUS
  Filled 2024-08-11 (×5): qty 10

## 2024-08-11 MED ORDER — PROMETHAZINE HCL 25 MG PO TABS: 25.0000 mg | ORAL_TABLET | Freq: Four times a day (QID) | ORAL | Status: DC | PRN

## 2024-08-11 MED ORDER — SODIUM CHLORIDE 0.9 % IV SOLN
1.0000 g | Freq: Once | INTRAVENOUS | Status: AC
  Administered 2024-08-11: 1 g via INTRAVENOUS
  Filled 2024-08-11: qty 10

## 2024-08-11 MED ORDER — BISACODYL 5 MG PO TBEC
5.0000 mg | DELAYED_RELEASE_TABLET | Freq: Every day | ORAL | Status: DC | PRN
  Administered 2024-08-14: 5 mg via ORAL
  Filled 2024-08-11: qty 1

## 2024-08-11 MED ORDER — DULOXETINE HCL 30 MG PO CPEP
60.0000 mg | ORAL_CAPSULE | Freq: Every day | ORAL | Status: DC
  Administered 2024-08-11 – 2024-08-14 (×4): 60 mg via ORAL
  Filled 2024-08-11 (×4): qty 2

## 2024-08-11 MED ORDER — DOXYCYCLINE HYCLATE 100 MG PO TABS
100.0000 mg | ORAL_TABLET | Freq: Two times a day (BID) | ORAL | Status: DC
  Administered 2024-08-11 – 2024-08-12 (×2): 100 mg via ORAL
  Filled 2024-08-11 (×2): qty 1

## 2024-08-11 MED ORDER — ALPRAZOLAM 0.5 MG PO TABS
1.0000 mg | ORAL_TABLET | Freq: Four times a day (QID) | ORAL | Status: DC | PRN
  Administered 2024-08-12 – 2024-08-15 (×7): 1 mg via ORAL
  Filled 2024-08-11 (×7): qty 2

## 2024-08-11 MED ORDER — HYDROMORPHONE HCL 1 MG/ML IJ SOLN
1.0000 mg | INTRAMUSCULAR | Status: DC | PRN
  Administered 2024-08-12: 1 mg via INTRAVENOUS
  Filled 2024-08-11: qty 1

## 2024-08-11 MED ORDER — SODIUM CHLORIDE 0.9 % IV SOLN
500.0000 mg | Freq: Once | INTRAVENOUS | Status: DC
  Filled 2024-08-11 (×2): qty 5

## 2024-08-11 MED ORDER — HYDROMORPHONE HCL 2 MG PO TABS
1.0000 mg | ORAL_TABLET | ORAL | Status: DC | PRN
  Administered 2024-08-12 (×2): 1 mg via ORAL
  Filled 2024-08-11 (×2): qty 1

## 2024-08-11 MED ORDER — GABAPENTIN 400 MG PO CAPS
400.0000 mg | ORAL_CAPSULE | Freq: Two times a day (BID) | ORAL | Status: DC
  Administered 2024-08-11 – 2024-08-15 (×8): 400 mg via ORAL
  Filled 2024-08-11 (×8): qty 1

## 2024-08-11 MED ORDER — SENNA 8.6 MG PO TABS
1.0000 | ORAL_TABLET | Freq: Every day | ORAL | Status: DC
  Administered 2024-08-12 – 2024-08-15 (×4): 8.6 mg via ORAL
  Filled 2024-08-11 (×4): qty 1

## 2024-08-11 MED ORDER — ACETAMINOPHEN 500 MG PO TABS
1000.0000 mg | ORAL_TABLET | Freq: Once | ORAL | Status: AC
  Administered 2024-08-11: 1000 mg via ORAL
  Filled 2024-08-11: qty 2

## 2024-08-11 MED ORDER — ACETAMINOPHEN 650 MG RE SUPP: 650.0000 mg | Freq: Four times a day (QID) | RECTAL | Status: DC | PRN

## 2024-08-11 MED ORDER — ALBUTEROL SULFATE (2.5 MG/3ML) 0.083% IN NEBU
2.5000 mg | INHALATION_SOLUTION | Freq: Four times a day (QID) | RESPIRATORY_TRACT | Status: DC | PRN
  Administered 2024-08-14 – 2024-08-15 (×2): 2.5 mg via RESPIRATORY_TRACT
  Filled 2024-08-11 (×2): qty 3

## 2024-08-11 MED ORDER — LACTATED RINGERS IV BOLUS
1000.0000 mL | Freq: Once | INTRAVENOUS | Status: AC
  Administered 2024-08-11: 1000 mL via INTRAVENOUS

## 2024-08-11 MED ORDER — ACETAMINOPHEN 325 MG PO TABS
650.0000 mg | ORAL_TABLET | Freq: Four times a day (QID) | ORAL | Status: DC | PRN
  Administered 2024-08-11 – 2024-08-13 (×2): 650 mg via ORAL
  Filled 2024-08-11 (×3): qty 2

## 2024-08-11 MED ORDER — IOHEXOL 350 MG/ML SOLN
100.0000 mL | Freq: Once | INTRAVENOUS | Status: AC | PRN
  Administered 2024-08-11: 100 mL via INTRAVENOUS

## 2024-08-11 NOTE — Progress Notes (Signed)
 Dear Doctor:  This patient has been identified as a candidate for PICC or CVC for the following reason (s): IV therapy over 48 hours and poor veins/poor circulatory system (CHF, COPD, emphysema, diabetes, steroid use, IV drug abuse, etc.) If you agree, please write an order for the indicated device.   Assessment by VAST: Veins too deep and too small for USGPIV or midline. Attempted x 3 unsuccessfully.   Thank you for supporting the early vascular access assessment program.

## 2024-08-11 NOTE — ED Provider Notes (Signed)
 [place] EMERGENCY DEPARTMENT AT [facility] Provider Note   CSN: [id number] Arrival date & time: 08/11/24  1320     Patient presents with: Fever   Norma Anthony is a 68 y.o. female.   HPI 69 year old female presents with fever.  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  She has been feeling poorly for couple days with some abdominal pain and she felt like she had a subjective fever yesterday.  She has not had vomiting but has been constipated.  She has been having some difficulty urinating but no dysuria.  She has had a headache during this time but no cough, shortness of breath.  Patient went to preop today and her temperature was elevated and so she was sent to the emergency department.  She has chronic knee pain but does not feel like she has had any new swelling or redness to the joints.  Prior to Admission medications   Medication Sig Start Date End Date Taking? Authorizing Provider  albuterol  (VENTOLIN  HFA) 108 (90 Base) MCG/ACT inhaler Inhale 2 puffs into the lungs every 6 (six) hours as needed for wheezing or shortness of breath. 05/12/21   [provider]  ALPRAZolam  (XANAX ) 1 MG tablet Take 1 tablet (1 mg total) by mouth every 6 (six) hours as needed for anxiety or sleep. 03/16/23   Patti Rosina SAUNDERS, PA-C  ascorbic acid  (VITAMIN C ) 1000 MG tablet Take 1,000 mg by mouth daily.    [provider]  b complex vitamins capsule Take 1 capsule by mouth daily.    [provider]  cephALEXin  (KEFLEX ) 500 MG capsule Take 500 mg by mouth 2 (two) times daily. 05/19/24   [provider]  clotrimazole -betamethasone  (LOTRISONE ) cream Apply 1 Application topically 2 (two) times daily as needed (for irritation under the breasts or abdominal folds).    [provider]  DULoxetine   (CYMBALTA ) 60 MG capsule Take 60 mg by mouth at bedtime. 09/14/18   [provider]  famotidine  (PEPCID ) 20 MG tablet TAKE 1 TABLET BY MOUTH AFTER SUPPER 04/01/23   Sood, Vineet, MD  Ferrous Fumarate -Folic Acid  324-1 MG TABS Take 1 tablet by mouth daily. Hematinic 02/29/20   [provider]  gabapentin  (NEURONTIN ) 400 MG capsule Take 400 mg by mouth 2 (two) times daily. 08/21/22   [provider]  HYDROmorphone  (DILAUDID ) 4 MG tablet Take 1 tablet 4 times a day by oral route as needed for 30 days. 06/22/24   [provider]  naloxone  (NARCAN ) nasal spray 4 mg/0.1 mL Place 1 spray into the nose once as needed (accidental over dose). 10/16/21   [provider]  nutrition supplement, JUVEN, (JUVEN) PACK Take 1 packet by mouth daily.    [provider]  ondansetron  (ZOFRAN ) 4 MG tablet Take 1 tablet (4 mg total) by mouth every 8 (eight) hours as needed for nausea or vomiting. 08/31/23 08/30/24  Leigh Valery RAMAN, PA-C  pantoprazole  (PROTONIX ) 40 MG tablet Take 40 mg by mouth daily with breakfast. 06/30/16   [provider]  polyethylene glycol (MIRALAX  / GLYCOLAX ) 17 g packet Take 17 g by mouth daily.    [provider]  promethazine  (PHENERGAN ) 25 MG tablet Take 25 mg by mouth every 6 (six) hours as needed for nausea or vomiting.    [provider]  senna (SENOKOT) 8.6 MG tablet Take 1 tablet  by mouth daily.    [provider]  tiZANidine  (ZANAFLEX ) 2 MG tablet Take 2 mg by mouth every 12 (twelve) hours as needed for muscle spasms. 02/29/24   [provider]  XARELTO  20 MG TABS tablet Take 20 mg by mouth daily with supper.    [provider]    Allergies: Belbuca  [buprenorphine  hcl], Pregabalin, Compazine  [prochlorperazine ], Droperidol, and Ergotamine-caffeine    Review of Systems  Constitutional:  Positive for fever.  HENT:  Negative for sore throat.   Respiratory:  Negative for cough and shortness of  breath.   Cardiovascular:  Negative for chest pain.  Gastrointestinal:  Positive for abdominal pain and constipation. Negative for vomiting.  Genitourinary:  Positive for difficulty urinating. Negative for dysuria.  Neurological:  Positive for headaches.    Updated Vital Signs BP (!) 148/112 (BP Location: Right Arm)   Pulse (!) 124   Temp (!) 100.9 F (38.3 C) (Oral)   Resp 18   Ht 5' 5 (1.651 m)   Wt 102.1 kg   SpO2 90%   BMI 37.44 kg/m   Physical Exam Vitals and nursing note reviewed.  Constitutional:      General: She is not in acute distress.    Appearance: She is well-developed. She is not ill-appearing or diaphoretic.  HENT:     Head: Normocephalic and atraumatic.  Cardiovascular:     Rate and Rhythm: Regular rhythm. Tachycardia present.     Heart sounds: Normal heart sounds.  Pulmonary:     Effort: Pulmonary effort is normal.     Breath sounds: Normal breath sounds.  Abdominal:     Palpations: Abdomen is soft.     Tenderness: There is abdominal tenderness (mild, generalized).  Musculoskeletal:     Cervical back: Normal range of motion. No rigidity.     Comments: No erythema or obvious joint effusions to either knee  Skin:    General: Skin is warm and dry.  Neurological:     Mental Status: She is alert.     (all labs ordered are listed, but only abnormal results are displayed) Labs Reviewed  CULTURE, BLOOD (ROUTINE X 2)  CULTURE, BLOOD (ROUTINE X 2)  COMPREHENSIVE METABOLIC PANEL WITH GFR  CBC WITH DIFFERENTIAL/PLATELET  PROTIME-INR  URINALYSIS, W/ REFLEX TO CULTURE (INFECTION SUSPECTED)  LIPASE, BLOOD  I-STAT CG4 LACTIC ACID, ED    EKG: None  Radiology: No results found.   Procedures   Medications Ordered in the ED  acetaminophen  (TYLENOL ) tablet 1,000 mg (has no administration in time range)  HYDROmorphone  (DILAUDID ) injection 1 mg (has no administration in time range)  lactated ringers  bolus 1,000 mL (has no administration in time range)                                     Medical Decision Making Amount and/or Complexity of Data Reviewed External Data Reviewed: notes. Labs: ordered. Radiology: ordered.  Risk OTC drugs. Prescription drug management.   Patient presents with a fever and abdominal pain.  She is tachycardic and febrile but otherwise is actually well-appearing.  No meningismus.  Does not seem altered at this time.  Will start some fluids and give Tylenol .  Workup is currently pending, lab work is currently pending.  Care transferred to Dr. Bari.     Final diagnoses:  None    ED Discharge Orders     None  Freddi Hamilton, MD 08/11/24 [phone]

## 2024-08-11 NOTE — Telephone Encounter (Signed)
-----   Message from [name] sent at 08/11/2024  9:56 AM EDT ----- Dagoberto, please let her know testing did not show hereditary blood clotting syndrome.  She should continue her blood thinner as prescribed.  This does not change her management. Thank you. ----- Message ----- From: Rebecka, Lab In [place] Sent: 08/07/2024   1:36 PM EDT To: [name], MD

## 2024-08-11 NOTE — ED Provider Notes (Signed)
 Patient signed out to me by previous provider. Please refer to their note for full HPI.  Briefly this is a 68 year old female who presented with fever.  Admits to feeling poorly for the past couple days with some generalized abdominal pain.  Endorses urinary frequency.  Arrives here with fever, tachycardia.  She does have supplemental oxygen to use at home as needed but she is hypoxic on room air around 85%, requiring 3 L nasal cannula..  She has a white count of 14, normal lactic acid, no acute electrolyte abnormalities.  Respiratory panel is negative.   Patient signed out pending imaging, CT scan.   ED Course: Chest x-ray and CTA of the chest abdomen pelvis notes multifocal pneumonia.  Respiratory panel is negative.  She continues to require 3 L nasal cannula, appears very fatigued and tired.  Still awaiting urinalysis.  Cultures have been sent.  Patient given IV fluids and antibiotics ordered.  Due to general decline with hypoxia and pneumonia patient require admission.  Patients evaluation and results requires admission for further treatment and care.  Spoke with hospitalist, reviewed patient's ED course and they accept admission.  Patient agrees with admission plan, offers no new complaints and is stable/unchanged at time of admit.  CRITICAL CARE Performed by: Roxie HERO Shamond Skelton   Total critical care time: 30 minutes  Critical care time was exclusive of separately billable procedures and treating other patients.  Critical care was necessary to treat or prevent imminent or life-threatening deterioration.  Critical care was time spent personally by me on the following activities: development of treatment plan with patient and/or surrogate as well as nursing, discussions with consultants, evaluation of patient's response to treatment, examination of patient, obtaining history from patient or surrogate, ordering and performing treatments and interventions, ordering and review of laboratory  studies, ordering and review of radiographic studies, pulse oximetry and re-evaluation of patient's condition.    Bari Roxie HERO, DO 08/11/24 1952

## 2024-08-11 NOTE — H&P (Signed)
 [facility] History and Physical  [name] FMW:[id number] DOB: 1956-05-08 DOA: 08/11/2024 PCP: Dreama Mcardle, MD  Presented from: Home Chief Complaint: Fever  History of Present Illness: Norma Anthony is a 68 y.o. female with PMH significant for COPD on intermittent 2 L oxygen, PE/DVT of RLE s/p IVC filter on Xarelto , obesity, OSA, GERD, fibromyalgia, arthritis who had left TKA in the past followed by MSSA infection requiring multiple restaged surgeries, currently on chronic pain medicines Patient was seen at preop today in preparation of right TKA planned for next week Thursday 9/4 by Dr. Fidel. She was noted to have fever.  Also reported not feeling good for the last couple of days with some abdominal pain, dysuria, subjective fever yesterday at home.  Sent to the ED for further workup.  In the ED, patient had a temperature of 100.9, tachycardic to 120s, blood pressure 148/112, Oxygen saturation in the ED dropped to 85% on room air and required 2 L oxygen.  Initial labs with WBC count 14.2, hemoglobin 15.4, platelet 193, renal function normal, lactic acid level normal Respiratory virus panel unremarkable Blood culture obtained CT head obtained CT angio chest, CT abdomen pelvis  -Negative for acute pulmonary embolus. -Heterogeneous bilateral L>R ground-glass airspace disease and patchy foci of consolidation, consistent with multifocal pneumonia. -No CT evidence for acute intra-abdominal or pelvic abnormality. -Moderate to large hiatal hernia. -Aortic atherosclerosis  Patient was started on IV Rocephin , IV azithromycin  Hospitalist service consulted for inpatient management.  At the time of my evaluation, patient was propped up in bed.  Alert, awake, oriented x 3.  Able to answer questions.  Husband at bedside. History reviewed in detail as above.  Review of Systems:  All systems were reviewed and were negative unless otherwise mentioned in the HPI   Past medical  history: Past Medical History:  Diagnosis Date   Acute respiratory failure with hypoxia (HCC) 03/23/2022   Acute respiratory failure with hypoxia (HCC) 03/23/2022   Anemia    Anxiety    Arthritis    CAD (coronary artery disease)    pt deneies   COPD (chronic obstructive pulmonary disease) (HCC)    Depression    Dyspnea    Fibromyalgia    Generalized anxiety disorder 12/11/2022   GERD (gastroesophageal reflux disease)    Hiatal hernia    History of kidney stones    Lung nodule 03/24/2022   Migraine headache    Multinodular goiter    Nephrolithiasis    Obstructive sleep apnea syndrome 07/01/2021   Opioid dependence (HCC) 03/23/2022   Pneumonia    Pulmonary embolism (HCC) 1996   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   Sleep apnea    Thyroid  nodule     Past surgical history: Past Surgical History:  Procedure Laterality Date   ABDOMINAL HYSTERECTOMY     BIOPSY THYROID      EXCISIONAL TOTAL KNEE ARTHROPLASTY WITH ANTIBIOTIC SPACERS Left 03/10/2023   Procedure: Irrigation and debridement left knee, resection total knee arthroplasty, antibiotic spacer placement;  Surgeon: Fidel Rogue, MD;  Location: [facility];  Service: Orthopedics;  Laterality: Left;   HERNIA REPAIR     I & D EXTREMITY Left 12/11/2022   Procedure: IRRIGATION AND DEBRIDEMENT EXTREMITY;  Surgeon: Sharl Selinda Dover, MD;  Location: [facility];  Service: Orthopedics;  Laterality: Left;   IVC FILTER INSERTION N/A 09/28/2022  Procedure: IVC FILTER INSERTION;  Surgeon: Sheree Penne Bruckner, MD;  Location: [facility] INVASIVE CV LAB;  Service: Cardiovascular;  Laterality: N/A;   IVC FILTER INSERTION N/A 08/09/2023   Procedure: IVC FILTER INSERTION;  Surgeon: Sheree Penne Bruckner, MD;  Location: [facility] INVASIVE CV LAB;  Service: Cardiovascular;  Laterality: N/A;   IVC FILTER  REMOVAL N/A 01/18/2023   Procedure: IVC FILTER REMOVAL;  Surgeon: Sheree Penne Bruckner, MD;  Location: [facility] INVASIVE CV LAB;  Service: Cardiovascular;  Laterality: N/A;   KIDNEY STONE SURGERY     KNEE ARTHROTOMY Left 02/03/2023   Procedure: KNEE ARTHROTOMY WITH SOFT TISSUE REPAIR;  Surgeon: Fidel Rogue, MD;  Location: [facility];  Service: Orthopedics;  Laterality: Left;  75   LAPAROSCOPIC NISSEN FUNDOPLICATION     PAROTIDECTOMY     REMOVAL OF CEMENTED SPACER KNEE Left 08/25/2023   Procedure: Knee spacer removal;  Surgeon: Fidel Rogue, MD;  Location: [facility];  Service: Orthopedics;  Laterality: Left;  350   TONSILLECTOMY     TOTAL KNEE ARTHROPLASTY Left 11/04/2022   Procedure: TOTAL KNEE ARTHROPLASTY;  Surgeon: Duwayne Purchase, MD;  Location: [facility];  Service: Orthopedics;  Laterality: Left;   TOTAL KNEE REVISION Left 08/25/2023   Procedure: TOTAL KNEE REVISION;  Surgeon: Fidel Rogue, MD;  Location: [facility];  Service: Orthopedics;  Laterality: Left;  350    Social History:  reports that she quit smoking about 3 years ago. Her smoking use included cigarettes. She started smoking about 13 years ago. She has a 5 pack-year smoking history. She has never used smokeless tobacco. She reports that she does not currently use alcohol . She reports that she does not currently use drugs.  Allergies:  Allergies  Allergen Reactions   Belbuca  [Buprenorphine  Hcl] Other (See Comments)     Horrible headache    Pregabalin Other (See Comments)    Paralysis present (finding)- not tolerated The patient stated she could not move her body, but she could think.   Compazine  [Prochlorperazine ] Anxiety   Droperidol Anxiety   Ergotamine-Caffeine Anxiety, Other (See Comments) and Hypertension    Increased systolic arterial pressure (finding)  (caffeine / ergotamine)   Belbuca  [buprenorphine  hcl], Pregabalin, Compazine  [prochlorperazine ], Droperidol, and Ergotamine-caffeine   Family history:  Family History   Problem Relation Age of Onset   Hypertension Mother      Physical Exam: Vitals:   08/11/24 1507 08/11/24 1515 08/11/24 1536 08/11/24 1708  BP: 121/74 121/74 114/88 110/68  Pulse: 100 (!) 103 (!) 105 97  Resp: 18  20 20   Temp: 98.6 F (37 C)  98 F (36.7 C) (!) 97.5 F (36.4 C)  TempSrc: Oral  Oral Oral  SpO2:  91% (!) 85% 93%  Weight:      Height:       Wt Readings from Last 3 Encounters:  08/11/24 102.1 kg  08/11/24 102.1 kg  08/07/24 104.8 kg   Body mass index is 37.44 kg/m.  General exam: Pleasant, elderly Caucasian female. Skin: No rashes, lesions or ulcers. HEENT: Atraumatic, normocephalic, no obvious bleeding Lungs: Clear to auscultation bilaterally,  CVS: S1, S2, no murmur,   GI/Abd: Soft, nontender, nondistended, bowel sound present,   CNS: Alert, awake, oriented x 3 Psychiatry: Sad affect Extremities: No pedal edema, no calf tenderness,    ----------------------------------------------------------------------------------------------------------------------------------------- ----------------------------------------------------------------------------------------------------------------------------------------- -----------------------------------------------------------------------------------------------------------------------------------------  Assessment/Plan: Sepsis POA Multifocal pneumonia Acute on chronic respiratory failure with hypoxia H/o COPD Presented with fever, not feeling good for last couple of days Noted to have fever, leukocytosis, CT chest  with bilateral groundglass opacities consistent with multifocal pneumonia Blood culture sent Given IV Rocephin  and IV azithromycin  in the ED Will continue antibiotic coverage with IV Rocephin  and doxycycline  Also obtain strep pneumonia, Legionella antigen, sputum culture Insulin spirometry, Mucinex , encourage ambulation Trend temperature, WBC count Uses 2 L of oxygen at home intermittently.   Requiring 2 L constantly in the ED.  Continue to monitor respiratory status Recent Labs  Lab 08/07/24 1303 08/11/24 1441 08/11/24 1511  WBC 7.7 14.2*  --   LATICACIDVEN  --   --  1.2   H/o left knee TKA  Complicated by MSSA infection and multiple staged surgeries On chronic suppressive therapy with Keflex  Since she is on IV Rocephin  right now, can hold Keflex   Planned right TKA Tentatively planned for next week 9/4 by Dr. Fidel Defer to orthopedics team.  I have informed PA Valery regarding her hospitalization.  Fibromyalgia Chronic pain PTA meds- Xanax , Cymbalta , gabapentin , Dilaudid  as needed, Zanaflex  as needed Resume all IV Dilaudid  as needed  H/o PE/DVT of RLE  s/p IVC filter [facility] with Dr. [facility] Continue Xarelto .  Obesity  Body mass index is 37.44 kg/m. Patient has been advised to make an attempt to improve diet and exercise patterns to aid in weight loss.  GERD Protonix   Aortic atherosclerosis   Mobility: PT order PT Orders:   PT Follow up Rec:    Goals of care:   Code Status: Full Code    DVT prophylaxis:   rivaroxaban  (XARELTO ) tablet 20 mg   Antimicrobials: IV Rocephin , doxycycline  Fluid: None Consultants: None Family Communication: Husband at bedside  Status: Inpatient Level of care:  Progressive   Patient is from: Home Anticipated d/c to: Pending clinical course, ultimately home likely  Diet:  Diet Order             Diet regular Room service appropriate? Yes; Fluid consistency: Thin  Diet effective now                    ------------------------------------------------------------------------------------- Severity of Illness: The appropriate patient status for this patient is INPATIENT. Inpatient status is judged to be reasonable and necessary in order to provide the required intensity of service to ensure the patient's safety. The patient's presenting symptoms, physical exam findings, and initial  radiographic and laboratory data in the context of their chronic comorbidities is felt to place them at high risk for further clinical deterioration. Furthermore, it is not anticipated that the patient will be medically stable for discharge from the hospital within 2 midnights of admission.   * I certify that at the point of admission it is my clinical judgment that the patient will require inpatient hospital care spanning beyond 2 midnights from the point of admission due to high intensity of service, high risk for further deterioration and high frequency of surveillance required.* -------------------------------------------------------------------------------------   Home Meds: Prior to Admission medications   Medication Sig Start Date End Date Taking? Authorizing Provider  albuterol  (VENTOLIN  HFA) 108 (90 Base) MCG/ACT inhaler Inhale 2 puffs into the lungs every 6 (six) hours as needed for wheezing or shortness of breath. 05/12/21   [provider]  ALPRAZolam  (XANAX ) 1 MG tablet Take 1 tablet (1 mg total) by mouth every 6 (six) hours as needed for anxiety or sleep. 03/16/23   Patti Rosina SAUNDERS, PA-C  ascorbic acid  (VITAMIN C ) 1000 MG tablet Take 1,000 mg by mouth daily.    [provider]  b complex vitamins  capsule Take 1 capsule by mouth daily.    [provider]  cephALEXin  (KEFLEX ) 500 MG capsule Take 500 mg by mouth 2 (two) times daily. 05/19/24   [provider]  clotrimazole -betamethasone  (LOTRISONE ) cream Apply 1 Application topically 2 (two) times daily as needed (for irritation under the breasts or abdominal folds).    [provider]  DULoxetine  (CYMBALTA ) 60 MG capsule Take 60 mg by mouth at bedtime. 09/14/18   [provider]  famotidine  (PEPCID ) 20 MG tablet TAKE 1 TABLET BY MOUTH AFTER SUPPER 04/01/23   Sood, Vineet, MD  Ferrous Fumarate -Folic Acid  324-1 MG TABS Take 1 tablet by mouth daily. Hematinic 02/29/20   [provider]  gabapentin  (NEURONTIN ) 400 MG capsule Take 400 mg by mouth 2 (two) times daily. 08/21/22   [provider]  HYDROmorphone  (DILAUDID ) 4 MG tablet Take 1 tablet 4 times a day by oral route as needed for 30 days. 06/22/24   [provider]  naloxone  (NARCAN ) nasal spray 4 mg/0.1 mL Place 1 spray into the nose once as needed (accidental over dose). 10/16/21   [provider]  nutrition supplement, JUVEN, (JUVEN) PACK Take 1 packet by mouth daily.    [provider]  ondansetron  (ZOFRAN ) 4 MG tablet Take 1 tablet (4 mg total) by mouth every 8 (eight) hours as needed for nausea or vomiting. 08/31/23 08/30/24  Leigh Valery RAMAN, PA-C  pantoprazole  (PROTONIX ) 40 MG tablet Take 40 mg by mouth daily with breakfast. 06/30/16   [provider]  polyethylene glycol (MIRALAX  / GLYCOLAX ) 17 g packet Take 17 g by mouth daily.    [provider]  promethazine  (PHENERGAN ) 25 MG tablet Take 25 mg by mouth every 6 (six) hours as needed for nausea or vomiting.    [provider]  senna (SENOKOT) 8.6 MG tablet Take 1 tablet by mouth daily.    [provider]  tiZANidine  (ZANAFLEX ) 2 MG tablet Take 2 mg by mouth every 12 (twelve) hours as needed for muscle spasms. 02/29/24   [provider]  XARELTO  20 MG TABS tablet Take 20 mg by mouth daily with supper.    [provider]    Labs on Admission:   CBC: Recent Labs  Lab 08/07/24 1303 08/11/24 1441  WBC 7.7 14.2*  NEUTROABS 4.4 11.4*  HGB 14.2 15.4*  HCT 44.8 50.6*  MCV 97.0 99.2  PLT 168 193    Basic Metabolic Panel: Recent Labs  Lab 08/07/24 1303 08/11/24 1441  NA 142 140  K 3.8 4.1  CL 103 101  CO2 34* 25  GLUCOSE 95 103*  BUN 14 12  CREATININE 0.64 0.93  CALCIUM 9.2 9.5    Liver Function Tests: Recent Labs  Lab 08/11/24 1441  AST 38  ALT 46*  ALKPHOS 122  BILITOT 0.4  PROT 7.7  ALBUMIN 4.1   Recent Labs  Lab 08/11/24 1441  LIPASE 10*   No results  for input(s): AMMONIA in the last 168 hours.  Cardiac Enzymes: No results for input(s): CKTOTAL, CKMB, CKMBINDEX, TROPONINI in the last 168 hours.  BNP (last 3 results) Recent Labs    06/02/24 1555  BNP 43.0    ProBNP (last 3 results) No results for input(s): PROBNP in the last 8760 hours.  CBG: No results for input(s): GLUCAP in the last 168 hours.  Lipase     Component Value Date/Time   LIPASE 10 (L) 08/11/2024 1441     Urinalysis  Component Value Date/Time   COLORURINE AMBER (A) 08/11/2024 2001   APPEARANCEUR HAZY (A) 08/11/2024 2001   APPEARANCEUR Clear 01/27/2024 1627   LABSPEC >1.046 (H) 08/11/2024 2001   PHURINE 5.0 08/11/2024 2001   GLUCOSEU NEGATIVE 08/11/2024 2001   HGBUR NEGATIVE 08/11/2024 2001   BILIRUBINUR NEGATIVE 08/11/2024 2001   BILIRUBINUR Negative 01/27/2024 1627   KETONESUR NEGATIVE 08/11/2024 2001   PROTEINUR NEGATIVE 08/11/2024 2001   NITRITE NEGATIVE 08/11/2024 2001   LEUKOCYTESUR NEGATIVE 08/11/2024 2001     Drugs of Abuse  No results found for: LABOPIA, COCAINSCRNUR, LABBENZ, AMPHETMU, THCU, LABBARB    Radiological Exams on Admission: CT ABDOMEN PELVIS W CONTRAST Result Date: 08/11/2024 CLINICAL DATA:  Fever positive D-dimer EXAM: CT ANGIOGRAPHY CHEST CT ABDOMEN AND PELVIS WITH CONTRAST TECHNIQUE: Multidetector CT imaging of the chest was performed using the standard protocol during bolus administration of intravenous contrast. Multiplanar CT image reconstructions and MIPs were obtained to evaluate the vascular anatomy. Multidetector CT imaging of the abdomen and pelvis was performed using the standard protocol during bolus administration of intravenous contrast. RADIATION DOSE REDUCTION: This exam was performed according to the departmental dose-optimization program which includes automated exposure control, adjustment of the mA and/or kV according to patient size and/or use of iterative reconstruction technique.  CONTRAST:  OMNIPAQUE  IOHEXOL  350 MG/ML SOLN COMPARISON:  Chest x-ray 08/11/2024, CT 06/02/2024, CT chest 09/04/2023 FINDINGS: CTA CHEST FINDINGS Cardiovascular: Satisfactory opacification of the pulmonary arteries to the segmental level. No evidence of pulmonary embolism. Mild aortic atherosclerosis. Negative for aneurysm. Borderline to mild cardiomegaly. No significant pericardial effusion Mediastinum/Nodes: Patent trachea. No thyroid  mass. Multiple subcentimeter mediastinal lymph nodes. Moderate to large hiatal hernia. Surgical changes at the diaphragmatic hiatus. Lungs/Pleura: Heterogeneous left greater than right ground-glass airspace disease and patchy foci of consolidation. No pleural effusion or pneumothorax. Musculoskeletal: Sternum appears intact. No acute osseous abnormality. Review of the MIP images confirms the above findings. CT ABDOMEN and PELVIS FINDINGS Hepatobiliary: No focal liver abnormality is seen. No gallstones, gallbladder wall thickening, or biliary dilatation. Pancreas: Unremarkable. No pancreatic ductal dilatation or surrounding inflammatory changes. Spleen: Normal in size without focal abnormality. Adrenals/Urinary Tract: Adrenal glands are normal. Kidneys show no hydronephrosis. Subcentimeter hypodensities too small to further characterize, no imaging follow-up is recommended. The bladder is unremarkable Stomach/Bowel: Stomach is within normal limits. Moderate stool burden. No evidence of bowel wall thickening, distention, or inflammatory changes. Vascular/Lymphatic: Nonaneurysmal aorta. No suspicious lymph nodes. IVC filter with apex below the renal vein confluence. Reproductive: Status post hysterectomy. No adnexal masses. Other: Negative for pelvic effusion or free air. Prior hernia repair. Musculoskeletal: No acute or suspicious osseous abnormality. Degenerative changes Review of the MIP images confirms the above findings. IMPRESSION: 1. Negative for acute pulmonary embolus. 2.  Heterogeneous left greater than right ground-glass airspace disease and patchy foci of consolidation, consistent with multifocal pneumonia. 3. No CT evidence for acute intra-abdominal or pelvic abnormality. 4. Moderate to large hiatal hernia. 5. Aortic atherosclerosis. Aortic Atherosclerosis (ICD10-I70.0). Electronically Signed   By: Luke Bun M.D.   On: 08/11/2024 17:16   CT Angio Chest PE W/Cm &/Or Wo Cm Result Date: 08/11/2024 CLINICAL DATA:  Fever positive D-dimer EXAM: CT ANGIOGRAPHY CHEST CT ABDOMEN AND PELVIS WITH CONTRAST TECHNIQUE: Multidetector CT imaging of the chest was performed using the standard protocol during bolus administration of intravenous contrast. Multiplanar CT image reconstructions and MIPs were obtained to evaluate the vascular anatomy. Multidetector CT imaging of the abdomen and pelvis was performed using the standard  protocol during bolus administration of intravenous contrast. RADIATION DOSE REDUCTION: This exam was performed according to the departmental dose-optimization program which includes automated exposure control, adjustment of the mA and/or kV according to patient size and/or use of iterative reconstruction technique. CONTRAST:  OMNIPAQUE  IOHEXOL  350 MG/ML SOLN COMPARISON:  Chest x-ray 08/11/2024, CT 06/02/2024, CT chest 09/04/2023 FINDINGS: CTA CHEST FINDINGS Cardiovascular: Satisfactory opacification of the pulmonary arteries to the segmental level. No evidence of pulmonary embolism. Mild aortic atherosclerosis. Negative for aneurysm. Borderline to mild cardiomegaly. No significant pericardial effusion Mediastinum/Nodes: Patent trachea. No thyroid  mass. Multiple subcentimeter mediastinal lymph nodes. Moderate to large hiatal hernia. Surgical changes at the diaphragmatic hiatus. Lungs/Pleura: Heterogeneous left greater than right ground-glass airspace disease and patchy foci of consolidation. No pleural effusion or pneumothorax. Musculoskeletal: Sternum appears  intact. No acute osseous abnormality. Review of the MIP images confirms the above findings. CT ABDOMEN and PELVIS FINDINGS Hepatobiliary: No focal liver abnormality is seen. No gallstones, gallbladder wall thickening, or biliary dilatation. Pancreas: Unremarkable. No pancreatic ductal dilatation or surrounding inflammatory changes. Spleen: Normal in size without focal abnormality. Adrenals/Urinary Tract: Adrenal glands are normal. Kidneys show no hydronephrosis. Subcentimeter hypodensities too small to further characterize, no imaging follow-up is recommended. The bladder is unremarkable Stomach/Bowel: Stomach is within normal limits. Moderate stool burden. No evidence of bowel wall thickening, distention, or inflammatory changes. Vascular/Lymphatic: Nonaneurysmal aorta. No suspicious lymph nodes. IVC filter with apex below the renal vein confluence. Reproductive: Status post hysterectomy. No adnexal masses. Other: Negative for pelvic effusion or free air. Prior hernia repair. Musculoskeletal: No acute or suspicious osseous abnormality. Degenerative changes Review of the MIP images confirms the above findings. IMPRESSION: 1. Negative for acute pulmonary embolus. 2. Heterogeneous left greater than right ground-glass airspace disease and patchy foci of consolidation, consistent with multifocal pneumonia. 3. No CT evidence for acute intra-abdominal or pelvic abnormality. 4. Moderate to large hiatal hernia. 5. Aortic atherosclerosis. Aortic Atherosclerosis (ICD10-I70.0). Electronically Signed   By: Luke Bun M.D.   On: 08/11/2024 17:16   CT Head Wo Contrast Result Date: 08/11/2024 CLINICAL DATA:  Headache, new onset (Age >= 51y) EXAM: CT HEAD WITHOUT CONTRAST TECHNIQUE: Contiguous axial images were obtained from the base of the skull through the vertex without intravenous contrast. RADIATION DOSE REDUCTION: This exam was performed according to the departmental dose-optimization program which includes automated  exposure control, adjustment of the mA and/or kV according to patient size and/or use of iterative reconstruction technique. COMPARISON:  None Available. FINDINGS: Brain: No evidence of acute infarction, hemorrhage, hydrocephalus, extra-axial collection or mass lesion/mass effect. Vascular: No hyperdense vessel. Skull: No acute fracture. Sinuses/Orbits: No acute orbital findings.  Clear sinuses. Other: No mastoid effusions. IMPRESSION: No evidence of acute intracranial abnormality. Electronically Signed   By: Gilmore GORMAN Molt M.D.   On: 08/11/2024 17:04   DG Chest Portable 1 View Result Date: 08/11/2024 EXAM: 1 VIEW XRAY OF THE CHEST 08/11/2024 03:25:00 PM COMPARISON: 06/14/2024 CLINICAL HISTORY: Fever; Per notes: Patient report fever, chills and headache while at Pre op check up. Patient report took tylenol  this morning without relief. Patient denies chest pain and SOB. Patient report nausea denies vomiting. FINDINGS: LUNGS AND PLEURA: Low lung volumes accentuate cardiomediastinal silhouette and pulmonary vascularity. Increasing hazy opacities at lung bases, representing possible atelectasis or developing pneumonia. Atypical infection not excluded. No pleural effusion. No pneumothorax. HEART AND MEDIASTINUM: No acute abnormality of the cardiac and mediastinal silhouettes. BONES AND SOFT TISSUES: No acute osseous abnormality. IMPRESSION: 1. Increasing hazy opacities  at lung bases, representing possible atelectasis or developing pneumonia. Atypical infection not excluded. Electronically signed by: Lonni Necessary MD 08/11/2024 03:47 PM EDT RP Workstation: [id number]     SignedChapman Rota, MD [facility] 08/11/2024

## 2024-08-11 NOTE — Progress Notes (Addendum)
 For Anesthesia: PCP - Dreama Mcardle, MD  Cardiologist - N/A Pulmonologist:Wert, Ozell NOVAK, MD  Bowel Prep reminder:N/A  Chest x-ray - 06/14/24 EKG - 06/02/24 Stress Test -  ECHO -  Cardiac Cath -  Pacemaker/ICD device last checked: Pacemaker orders received: Device Rep notified:  Spinal Cord Stimulator:N/A  Sleep Study - N/A CPAP - N/A  Fasting Blood Sugar - N/A Checks Blood Sugar _____ times a day Date and result of last Hgb A1c-  Last dose of GLP1 agonist- N/A GLP1 instructions: Hold 7 days prior to schedule (Hold 24 hours-daily)   Last dose of SGLT-2 inhibitors- N/A SGLT-2 instructions: Hold 72 hours prior to surgery  Blood Thinner Instructions:Xarelto : will be hold after: 08/13/24 as instructed by MD. Aspirin  Instructions: Last Dose:  Activity level: Unable to go up a flight of stairs without shortness of breath    Anesthesia review: Hx: ARF,OSA(NO CPAP),PE,CAD,COPD. VS: T: 102.7;P: 127; BP: 150/108. During PST. Harlene ORN: PA was notified.  Patient denies shortness of breath, fever, cough and chest pain at PAT appointment   Patient verbalized understanding of instructions that were reviewed over the telephone.

## 2024-08-11 NOTE — ED Triage Notes (Signed)
 Patient report fever, chills and headache while at Pre op check up. Patient report took tylenol  this morning without relief.  Patient denies chest pain and SOB.  Patient report nausea denies vomiting.

## 2024-08-11 NOTE — Telephone Encounter (Signed)
 PC to patient, informed her of results, she verbalizes understanding.

## 2024-08-12 ENCOUNTER — Encounter (HOSPITAL_COMMUNITY): Payer: Self-pay | Admitting: Internal Medicine

## 2024-08-12 DIAGNOSIS — J13 Pneumonia due to Streptococcus pneumoniae: Secondary | ICD-10-CM

## 2024-08-12 LAB — BLOOD CULTURE ID PANEL (REFLEXED) - BCID2

## 2024-08-12 LAB — CBC
HCT: 42.2 % (ref 36.0–46.0)
Hemoglobin: 12.6 g/dL (ref 12.0–15.0)
MCH: 31.3 pg (ref 26.0–34.0)
MCHC: 29.9 g/dL — ABNORMAL LOW (ref 30.0–36.0)
MCV: 105 fL — ABNORMAL HIGH (ref 80.0–100.0)
Platelets: 137 K/uL — ABNORMAL LOW (ref 150–400)
RBC: 4.02 MIL/uL (ref 3.87–5.11)
RDW: 15.4 % (ref 11.5–15.5)
WBC: 8.1 K/uL (ref 4.0–10.5)
nRBC: 0 % (ref 0.0–0.2)

## 2024-08-12 LAB — BASIC METABOLIC PANEL WITH GFR
Anion gap: 9 (ref 5–15)
BUN: 12 mg/dL (ref 8–23)
CO2: 29 mmol/L (ref 22–32)
Calcium: 9 mg/dL (ref 8.9–10.3)
Chloride: 105 mmol/L (ref 98–111)
Creatinine, Ser: 0.91 mg/dL (ref 0.44–1.00)
GFR, Estimated: >60 mL/min (ref >60)
Glucose, Bld: 139 mg/dL — ABNORMAL HIGH (ref 70–99)
Potassium: 4.9 mmol/L (ref 3.5–5.1)
Sodium: 143 mmol/L (ref 135–145)

## 2024-08-12 LAB — PROCALCITONIN: Procalcitonin: 0.11 ng/mL

## 2024-08-12 MED ORDER — CHLORHEXIDINE GLUCONATE CLOTH 2 % EX PADS
6.0000 | MEDICATED_PAD | Freq: Every day | CUTANEOUS | Status: DC
  Administered 2024-08-12 – 2024-08-14 (×3): 6 via TOPICAL

## 2024-08-12 MED ORDER — B COMPLEX-C PO TABS
1.0000 | ORAL_TABLET | Freq: Every day | ORAL | Status: DC
  Administered 2024-08-12 – 2024-08-15 (×4): 1 via ORAL
  Filled 2024-08-12 (×4): qty 1

## 2024-08-12 MED ORDER — DIPHENHYDRAMINE HCL 25 MG PO CAPS: 25.0000 mg | ORAL_CAPSULE | Freq: Three times a day (TID) | ORAL | Status: DC | PRN

## 2024-08-12 MED ORDER — FLUCONAZOLE 150 MG PO TABS
150.0000 mg | ORAL_TABLET | Freq: Once | ORAL | Status: AC
  Administered 2024-08-12: 150 mg via ORAL
  Filled 2024-08-12: qty 1

## 2024-08-12 MED ORDER — FAMOTIDINE 20 MG PO TABS
20.0000 mg | ORAL_TABLET | Freq: Every day | ORAL | Status: DC
  Administered 2024-08-12 – 2024-08-14 (×3): 20 mg via ORAL
  Filled 2024-08-12 (×3): qty 1

## 2024-08-12 MED ORDER — SODIUM CHLORIDE 0.9% FLUSH: 10.0000 mL | INTRAVENOUS | Status: DC | PRN

## 2024-08-12 MED ORDER — VITAMIN C 500 MG PO TABS
1000.0000 mg | ORAL_TABLET | Freq: Every day | ORAL | Status: DC
  Administered 2024-08-12 – 2024-08-15 (×4): 1000 mg via ORAL
  Filled 2024-08-12 (×4): qty 2

## 2024-08-12 MED ORDER — HYDROMORPHONE HCL 2 MG PO TABS
4.0000 mg | ORAL_TABLET | Freq: Four times a day (QID) | ORAL | Status: DC | PRN
  Administered 2024-08-12 – 2024-08-13 (×3): 4 mg via ORAL
  Filled 2024-08-12 (×3): qty 2

## 2024-08-12 MED ORDER — VANCOMYCIN HCL 1250 MG/250ML IV SOLN
1250.0000 mg | INTRAVENOUS | Status: DC
  Administered 2024-08-12 – 2024-08-13 (×2): 1250 mg via INTRAVENOUS
  Filled 2024-08-12 (×2): qty 250

## 2024-08-12 NOTE — Progress Notes (Signed)
 This RN attempted to call report to [facility] facility twice. Each attempt resulted in the phone continuously ringing with no answer. Printed discharge instruction packet being sent with the patient to their facility. Patient to be transported to [facility] via [place] which was called at 1300.   Leonor FORBES Clarks, BSN, RN  08/12/24 1:35 PM

## 2024-08-12 NOTE — Evaluation (Signed)
 Physical Therapy Evaluation Patient Details Name: Norma Anthony MRN: [id number] DOB: 1956/11/28 Today's Date: 08/12/2024  History of Present Illness  Pt is 68 yo female who presented from preop with fever, abdominal pain, and dysuria on 08/12/24.  She is scheduled for R TKA on 08/17/24. Pt admitted with sepsis with multifocal PNE.  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 Pt is limited ambulator at baseline due to multiple L knee surgeries and R knee pain with upcoming TKA scheduled.  She has all DME and support at home.  Today, pt ambulating 30' with RW and CGA, fatigued easily.  She did have decrease in O2 sats with activity on RA but quickly recovered. Pt likely not far from baseline -likely no PT needs at d/c.  Will benefit from acute PT to progress while hospitalized.  Pt currently with functional limitations due to the deficits listed below (see PT Problem List). Pt will benefit from acute skilled PT to increase their independence and safety with mobility to allow discharge.           If plan is discharge home, recommend the following: A little help with walking and/or transfers;A little help with bathing/dressing/bathroom;Assistance with cooking/housework;Help with stairs or ramp for entrance   Can travel by private vehicle        Equipment Recommendations None recommended by PT  Recommendations for Other Services       Functional Status Assessment Patient has had a recent decline in their functional status and demonstrates the ability to make significant improvements in function in a reasonable and predictable amount of time.     Precautions / Restrictions Precautions Precautions: Fall      Mobility  Bed Mobility Overal  bed mobility: Needs Assistance Bed Mobility: Supine to Sit     Supine to sit: Min assist          Transfers Overall transfer level: Needs assistance Equipment used: Rolling walker (2 wheels) Transfers: Sit to/from Stand Sit to Stand: Contact guard assist           General transfer comment: Stood from bed and toilet    Ambulation/Gait Ambulation/Gait assistance: Contact guard assist Gait Distance (Feet): 30 Feet Assistive device: Rolling walker (2 wheels) Gait Pattern/deviations: Step-to pattern, Decreased stride length, Shuffle, Antalgic Gait velocity: decreased     General Gait Details: Mild instability and fatiguing easily requiring CGA and chair follow  Stairs            Wheelchair Mobility     Tilt Bed    Modified Rankin (Stroke Patients Only)       Balance Overall balance assessment: Needs assistance Sitting-balance support: No upper extremity supported Sitting balance-Leahy Scale: Good     Standing balance support: Bilateral upper extremity supported, Reliant on assistive device for balance Standing balance-Leahy Scale: Poor                               Pertinent Vitals/Pain Pain Assessment Pain Assessment: 0-10 Pain Score: 5  Pain Location: headache Pain Descriptors / Indicators: Aching Pain Intervention(s): Limited activity within patient's tolerance, Monitored during session, Premedicated before session, Repositioned    Home Living Family/patient expects to be discharged to:: Private  residence Living Arrangements: Spouse/significant other Available Help at Discharge: Family;Available 24 hours/day Type of Home: House Home Access: Stairs to enter Entrance Stairs-Rails: None Entrance Stairs-Number of Steps: 2   Home Layout: One level Home Equipment: Agricultural consultant (2 wheels);Cane - single point;Wheelchair - manual;Rollator (4 wheels);BSC/3in1;Tub bench;Grab bars - tub/shower (grab bar for tub hooks on side) Additional  Comments: Purewick    Prior Function Prior Level of Function : History of Falls (last six months);Needs assist             Mobility Comments: Pt had 5 surgeries on L knee from 2023-2024 , L knee progressed and doing better but then R knee started buckling, painful, and giving out in dec of 2024.  Since Dec of 2024 she has had multiple falls and limited mobility.  Husband has been pulling her up steps in w/c.  She has been ambulating in home some but also uses w/c if needed.  Walks with RW. ADLs Comments: Spouse assisting with lower body adls.     Extremity/Trunk Assessment   Upper Extremity Assessment Upper Extremity Assessment: Overall WFL for tasks assessed    Lower Extremity Assessment Lower Extremity Assessment: LLE deficits/detail;RLE deficits/detail RLE Deficits / Details: ROM WFL; MMT 4/5 throughout LLE Deficits / Details: Hx of L TKA with complications requiring multiple surgeries.  L knee lacking at least 10 degrees ext, auidable crepitus with ext, and 4/5 strength.  Otherwise L LE ROM WFL and MMT 4+/5    Cervical / Trunk Assessment Cervical / Trunk Assessment: Normal  Communication        Cognition Arousal: Alert Behavior During Therapy: WFL for tasks assessed/performed   PT - Cognitive impairments: No apparent impairments                                 Cueing       General Comments General comments (skin integrity, edema, etc.): Pt on RA with sats 95% rest, dropped to 85% with activity but quick recovery to 93% so left on RA and notified RN.  Pt also with questions regarding pain meds for migraine and meds for yeast infection - notified RN    Exercises     Assessment/Plan    PT Assessment Patient needs continued PT services  PT Problem List Decreased strength;Decreased mobility;Decreased range of motion;Decreased activity tolerance;Decreased balance;Decreased knowledge of use of DME       PT Treatment Interventions DME  instruction;Therapeutic exercise;Gait training;Functional mobility training;Therapeutic activities;Patient/family education;Stair training;Modalities;Balance training    PT Goals (Current goals can be found in the Care Plan section)  Acute Rehab PT Goals Patient Stated Goal: return home PT Goal Formulation: With patient/family Time For Goal Achievement: 08/26/24 Potential to Achieve Goals: Good    Frequency Min 2X/week     Co-evaluation               AM-PAC PT 6 Clicks Mobility  Outcome Measure Help needed turning from your back to your side while in a flat bed without using bedrails?: A Little Help needed moving from lying on your back to sitting on the side of a flat bed without using bedrails?: A Little Help needed moving to and from a bed to a chair (including a wheelchair)?: A Little Help needed standing up from a chair using your arms (e.g., wheelchair or bedside chair)?: A Little Help needed to walk in hospital room?: A Little Help needed climbing 3-5 steps with a  railing? : Total 6 Click Score: 16    End of Session Equipment Utilized During Treatment: Gait belt Activity Tolerance: Patient limited by fatigue Patient left: in chair;with call bell/phone within reach;with family/visitor present Nurse Communication: Mobility status PT Visit Diagnosis: Other abnormalities of gait and mobility (R26.89);Muscle weakness (generalized) (M62.81)    Time: 8849-8763 PT Time Calculation (min) (ACUTE ONLY): 46 min   Charges:   PT Evaluation $PT Eval Low Complexity: 1 Low PT Treatments $Gait Training: 8-22 mins $Therapeutic Activity: 8-22 mins PT General Charges $$ ACUTE PT VISIT: 1 Visit         Benjiman, PT Acute Rehab [facility] [phone]   Benjiman VEAR Mulberry 08/12/2024, 1:33 PM

## 2024-08-12 NOTE — Progress Notes (Addendum)
 Pharmacy Antibiotic Note  Norma Anthony is a 68 y.o. female admitted on 08/11/2024 with sepsis present on admission likely secondary to multifocal pneumonia.  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  Gram positive cocci in clusters in one blood culture bottle out of three obtained.  Pharmacy has been consulted for vancomycin  dosing for bacteremia.  Plan: Vancomycin  1250 mg IV every 24 hours (Goal AUC 400-550, eAUC 497.6, SCr used: 0.91) Monitor clinical progress, renal function, vancomycin  levels as indicated F/U C&S, abx deescalation / LOT   Height: 5' 5 (165.1 cm) Weight: 102.1 kg (225 lb) IBW/kg (Calculated) : 57  Temp (24hrs), Avg:98.1 F (36.7 C), Min:97.5 F (36.4 C), Max:98.6 F (37 C)  Recent Labs  Lab 08/07/24 1303 08/11/24 1441 08/11/24 1511 08/12/24 0612  WBC 7.7 14.2*  --  8.1  CREATININE 0.64 0.93  --  0.91  LATICACIDVEN  --   --  1.2  --     Estimated Creatinine Clearance: 71 mL/min (by C-G formula based on SCr of 0.91 mg/dL).    Allergies  Allergen Reactions   Belbuca  [Buprenorphine  Hcl] Other (See Comments)     Horrible headache    Pregabalin Other (See Comments)    Paralysis present (finding)- not tolerated The patient stated she could not move her body, but she could think.   Compazine  [Prochlorperazine ] Anxiety   Droperidol Anxiety   Ergotamine-Caffeine Anxiety, Other (See Comments) and Hypertension    Increased systolic arterial pressure (finding)  (caffeine / ergotamine)    Antimicrobials this admission: 8/29 Rocephin  >>  8/29-8/30 doxycycline  8/30 vancomycin  >>   Microbiology results: 8/29 BCx: 1 out of 3 GPC in clusters in one bottle; identified as staph species 8/29 MRSA PCR: neg  Thank you for allowing pharmacy to be a part of this patient's care.  Eleanor Agent, PharmD, BCPS 08/12/2024 3:04 PM

## 2024-08-12 NOTE — Progress Notes (Signed)
 Mobility Specialist - Progress Note   08/12/24 1353  Mobility  Activity Pivoted/transferred from bed to chair  Level of Assistance Contact guard assist, steadying assist  Assistive Device Front wheel walker  Range of Motion/Exercises Active  Activity Response Tolerated well  Mobility Referral Yes  Mobility visit 1 Mobility  Mobility Specialist Start Time (ACUTE ONLY) 1343  Mobility Specialist Stop Time (ACUTE ONLY) 1353  Mobility Specialist Time Calculation (min) (ACUTE ONLY) 10 min   Pt requested assistance back to bed. Was left in bed with all needs met. Husband in room.  Erminio Leos,  Mobility Specialist Can be reached via Secure Chat

## 2024-08-12 NOTE — TOC Initial Note (Signed)
 Transition of Care [facility]) - Initial/Assessment Note    Patient Details  Name: Norma Anthony MRN: [id number] Date of Birth: March 28, 1956  [facility] CM/SW Contact:    Sheri ONEIDA Sharps, LCSW Phone Number: 08/12/2024, 1:41 PM  Clinical Narrative:                 Pt from home w/ spouse. Pt continues medical workup. TOC following for dc needs.     Barriers to Discharge: Barriers Resolved   Patient Goals and CMS Choice Patient states their goals for this hospitalization and ongoing recovery are:: return home   Choice offered to / list presented to : NA      Expected Discharge Plan and Services In-house Referral: NA Discharge Planning Services: NA   Living arrangements for the past 2 months: Single Family Home                 DME Arranged: N/A DME Agency: NA       HH Arranged: NA HH Agency: NA        Prior Living Arrangements/Services Living arrangements for the past 2 months: Single Family Home Lives with:: Spouse Patient language and need for interpreter reviewed:: Yes Do you feel safe going back to the place where you live?: Yes      Need for Family Participation in Patient Care: Yes (Comment) Care giver support system in place?: Yes (comment)   Criminal Activity/Legal Involvement Pertinent to Current Situation/Hospitalization: No - Comment as needed  Activities of Daily Living   ADL Screening (condition at time of admission) Independently performs ADLs?: Yes (appropriate for developmental age) Is the patient deaf or have difficulty hearing?: No Does the patient have difficulty seeing, even when wearing glasses/contacts?: No Does the patient have difficulty concentrating, remembering, or making decisions?: No  Permission Sought/Granted                  Emotional Assessment Appearance:: Appears stated age Attitude/Demeanor/Rapport: Engaged Affect (typically observed): Accepting Orientation: : Oriented to Situation, Oriented to  Time, Oriented  to Place, Oriented to Self Alcohol  / Substance Use: Not Applicable Psych Involvement: No (comment)  Admission diagnosis:  Pneumonia [J18.9] Weakness [R53.1] Hypoxia [R09.02] Pneumonia due to infectious organism, unspecified laterality, unspecified part of lung [J18.9] Patient Active Problem List   Diagnosis Date Noted   Pneumonia 06/02/2024   Abdominal pain 03/30/2024   Vomiting 03/30/2024   Acute diarrhea 03/30/2024   Hypokalemia 03/30/2024   Prolonged QT interval 03/30/2024   Acute diverticulitis 03/29/2024   Ambulatory dysfunction 11/19/2023   Kidney stones 11/09/2023   Infection of total right knee replacement, 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/25/2023   Long term current use of antibiotics 04/14/2023   Intertriginous dermatitis associated with moisture 04/14/2023   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   Surgical wound dehiscence 03/08/2023   Failed total knee, 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   Hemarthrosis of left knee 12/11/2022   Chronic respiratory failure with hypoxia (HCC) 12/11/2022   Generalized anxiety disorder 12/11/2022   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   DOE (dyspnea on exertion) 04/30/2022   Ex-smoker 04/30/2022   Upper airway cough syndrome 04/30/2022   Lung nodule 03/24/2022   Thyroid  nodule 03/24/2022   Opioid dependence (HCC) 03/23/2022   COPD (chronic obstructive pulmonary disease) (HCC) 03/22/2022   Obstructive sleep apnea syndrome 07/01/2021   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 [place] [address] Phone: [phone] Fax: [phone]  [id number] - [place], [place] - [address] AT [facility] [address]  [place] [address] Phone: [phone] Fax: [phone]     Social Drivers of Health (SDOH) Social History: SDOH Screenings   Food Insecurity: No Food Insecurity (08/11/2024)  Housing: Low Risk  (08/11/2024)  Transportation Needs: No Transportation Needs (08/11/2024)  Utilities: Not At Risk (08/11/2024)  Depression (PHQ2-9): Low Risk  (08/07/2024)  Financial Resource Strain: Low Risk  (04/23/2021)   Received from [facility]  Social Connections: Moderately Integrated (08/11/2024)  Stress: Stress Concern Present (04/23/2021)   Received from [facility]  Tobacco Use: Medium Risk (06/14/2024)   SDOH Interventions:     Readmission Risk Interventions    08/12/2024    1:40 PM 06/05/2024   11:00 AM 03/30/2024    8:49 AM  Readmission Risk Prevention Plan  Transportation Screening Complete Complete Complete  Medication Review Oceanographer) Complete Complete Complete  PCP or Specialist appointment within 3-5 days of discharge Complete    HRI or Home Care Consult Complete Complete Complete  SW Recovery Care/Counseling Consult Complete Complete Complete  Palliative Care Screening Not Applicable Not Applicable Not Applicable  Skilled Nursing Facility Not Applicable Not Applicable Not Applicable

## 2024-08-12 NOTE — Progress Notes (Signed)
 PHARMACY - PHYSICIAN COMMUNICATION CRITICAL VALUE ALERT - BLOOD CULTURE IDENTIFICATION (BCID)  Norma Anthony is an 68 y.o. female who presented to [facility] on 08/11/2024 with a chief complaint of fever. 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 Sent to ED for further workup when noted to have fever at yesterday's appointment with ortho for preparation of right TKA planned for next week.  Assessment:   8/29 BCx: 1 out of 3 with gpc in clusters; BCID detected staph species  Name of physician (or Provider) Contacted: Ghimire  Current antibiotics: Rocephin  and vancomycin   Changes to prescribed antibiotics recommended:  Patient is on recommended antibiotics - No changes needed  Results for orders placed or performed during the hospital encounter of 08/11/24  Blood Culture ID Panel (Reflexed) (Collected: 08/11/2024  3:05 PM)  Result Value Ref Range   Enterococcus faecalis NOT DETECTED NOT DETECTED   Enterococcus Faecium NOT DETECTED NOT DETECTED   Listeria monocytogenes NOT DETECTED NOT DETECTED   Staphylococcus species DETECTED (A) NOT DETECTED   Staphylococcus aureus (BCID) NOT DETECTED NOT DETECTED   Staphylococcus epidermidis NOT DETECTED NOT DETECTED   Staphylococcus lugdunensis NOT DETECTED NOT DETECTED   Streptococcus species NOT DETECTED NOT DETECTED   Streptococcus agalactiae NOT DETECTED NOT DETECTED   Streptococcus pneumoniae NOT DETECTED NOT DETECTED   Streptococcus pyogenes NOT DETECTED NOT DETECTED   A.calcoaceticus-baumannii NOT DETECTED NOT DETECTED   Bacteroides fragilis NOT DETECTED NOT DETECTED   Enterobacterales NOT DETECTED NOT DETECTED   Enterobacter cloacae complex NOT DETECTED NOT DETECTED   Escherichia coli NOT DETECTED NOT DETECTED   Klebsiella aerogenes NOT DETECTED NOT DETECTED   Klebsiella oxytoca NOT DETECTED NOT DETECTED   Klebsiella pneumoniae NOT DETECTED NOT DETECTED   Proteus species NOT DETECTED NOT DETECTED   Salmonella  species NOT DETECTED NOT DETECTED   Serratia marcescens NOT DETECTED NOT DETECTED   Haemophilus influenzae NOT DETECTED NOT DETECTED   Neisseria meningitidis NOT DETECTED NOT DETECTED   Pseudomonas aeruginosa NOT DETECTED NOT DETECTED   Stenotrophomonas maltophilia NOT DETECTED NOT DETECTED   Candida albicans NOT DETECTED NOT DETECTED   Candida auris NOT DETECTED NOT DETECTED   Candida glabrata NOT DETECTED NOT DETECTED   Candida krusei NOT DETECTED NOT DETECTED   Candida parapsilosis NOT DETECTED NOT DETECTED   Candida tropicalis NOT DETECTED NOT DETECTED   Cryptococcus neoformans/gattii NOT DETECTED NOT DETECTED    Eleanor Agent, PharmD, BCPS 08/12/2024  3:31 PM

## 2024-08-12 NOTE — Progress Notes (Signed)
 Peripherally Inserted Central Catheter Placement  The IV Nurse has discussed with the patient and/or persons authorized to consent for the patient, the purpose of this procedure and the potential benefits and risks involved with this procedure.  The benefits include less needle sticks, lab draws from the catheter, and the patient may be discharged home with the catheter. Risks include, but not limited to, infection, bleeding, blood clot (thrombus formation), and puncture of an artery; nerve damage and irregular heartbeat and possibility to perform a PICC exchange if needed/ordered by physician.  Alternatives to this procedure were also discussed.  Bard Power PICC patient education guide, fact sheet on infection prevention and patient information card has been provided to patient /or left at bedside.    PICC Placement Documentation  PICC Single Lumen 08/12/24 Right Brachial 39 cm 0 cm (Active)  Indication for Insertion or Continuance of Line Limited venous access - need for IV therapy >5 days (PICC only) 08/12/24 1009  Exposed Catheter (cm) 0 cm 08/12/24 1009  Site Assessment Clean, Dry, Intact 08/12/24 1009  Line Status Flushed;Saline locked;Blood return noted 08/12/24 1009  Dressing Type Transparent;Securing device 08/12/24 1009  Dressing Status Clean, Dry, Intact 08/12/24 1009  Line Care Tubing changed;Cap changed;Connections checked and tightened 08/12/24 1009  Line Adjustment (NICU/IV Team Only) No 08/12/24 1009  Dressing Intervention New dressing;Adhesive placed at insertion site (IV team only);Adhesive placed around edges of dressing (IV team/ICU RN only) 08/12/24 1009  Dressing Change Due 08/19/24 08/12/24 1009       Norma Anthony 08/12/2024, 10:09 AM

## 2024-08-12 NOTE — Plan of Care (Signed)
  Problem: Education: Goal: Knowledge of General Education information will improve Description: Including pain rating scale, medication(s)/side effects and non-pharmacologic comfort measures Outcome: Progressing   Problem: Coping: Goal: Level of anxiety will decrease Outcome: Progressing   Problem: Pain Managment: Goal: General experience of comfort will improve and/or be controlled Outcome: Progressing

## 2024-08-12 NOTE — Progress Notes (Signed)
 PROGRESS NOTE    Norma Anthony  FMW:[id number] DOB: 1956/08/17 DOA: 08/11/2024 PCP: Dreama Mcardle, MD    Brief Narrative:  68 year old with history of COPD on intermittent 2 L oxygen at home, PE and DVT on Xarelto , sleep apnea, GERD, 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  In the emergency room she had temperature 100.9, heart rate 120, blood pressure stable.  85% on room air requiring 2 L oxygen.  WBC count 14.2.  Respiratory panel negative.  Admitted for possible infection, likely pneumonia.  Subjective: Patient seen in the morning rounds.  Denies any complaints.  She is without any fever overnight.  She does have pain but is mostly chronic.  There is no indication to use IV pain medications.  Patient is wondering whether she can have surgery or not.  Assessment & Plan:   Sepsis present on admission likely secondary to multifocal pneumonia.  Acute on chronic hypoxemic respiratory failure. Presented with tachycardia, fever and leukocytosis. CT scan of the chest with bilateral groundglass opacities consistent with multifocal pneumonia. Viral panel negative. Currently on Rocephin  and doxycycline .  Change doxycycline  to vancomycin . Chest physiotherapy, incentive spirometry, deep breathing exercises, sputum induction, mucolytic's and bronchodilators. Blood cultures, aerobic bottle only with gram-positive cocci in clusters.  Likely contaminant.  Patient does have history of MSSA bacteremia, will change antibiotics to vancomycin . Sputum cultures, Legionella and streptococcal antigen.  Supplemental oxygen to keep saturations more than 90%. Mobilize with PT OT.  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  Patient is scheduled for right knee arthroplasty.  Orthopedics aware.  She can likely have surgery if no systemic infection.  Await final identification of gram-positive cocci.  Fibromyalgia and chronic pain syndrome: On multiple medications with Xanax , Cymbalta , gabapentin , oral Dilaudid  and Zanaflex .  Avoid IV pain medications for chronic pain.  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  This is continued.  Surgery to decide when to hold Xarelto .  Will need heparin  perioperative.  Sleep apnea on CPAP.    DVT prophylaxis:  rivaroxaban  (XARELTO ) tablet 20 mg   Code Status: Full code Family Communication: None at the bedside Disposition Plan: Status is: Inpatient Remains inpatient appropriate because: IV antibiotics     Consultants:  None  Procedures:  None  Antimicrobials:  Rocephin  and doxycycline  8/29---     Objective: Vitals:   08/11/24 2112 08/12/24 0559 08/12/24 0616 08/12/24 1305  BP: 126/70 (!) 86/57 110/75 108/70  Pulse: 90 64  80  Resp: 18 18  16   Temp:  98 F (36.7 C)  98.3 F (36.8 C)  TempSrc:  Oral  Oral  SpO2:  96%  94%  Weight:      Height:        Intake/Output Summary (Last 24 hours) at 08/12/2024 1447 Last data filed at 08/12/2024 1300 Gross per 24 hour  Intake 400 ml  Output --  Net 400 ml   Filed Weights   08/11/24 1330  Weight: 102.1 kg    Examination:  General exam: Appears calm and comfortable.  Pleasant to interaction. Respiratory system: Clear to auscultation. Respiratory effort normal.  Mostly on room air. Cardiovascular system:  S1 & S2 heard, RRR. No JVD, murmurs, rubs, gallops or clicks. No pedal edema. Gastrointestinal system: Abdomen is nondistended, soft and nontender. No organomegaly or masses felt. Normal bowel sounds heard. Central nervous system: Alert and oriented. No focal neurological deficits. Extremities: Symmetric 5 x 5 power. Deformity with muscle  wasting on the left knee.    Data Reviewed: I have personally reviewed following labs and imaging studies  CBC: Recent Labs  Lab 08/07/24 1303 08/11/24 1441 08/12/24 0612  WBC 7.7 14.2* 8.1  NEUTROABS 4.4 11.4*  --   HGB 14.2 15.4* 12.6  HCT 44.8 50.6* 42.2  MCV 97.0 99.2 105.0*  PLT 168 193 137*   Basic Metabolic Panel: Recent Labs  Lab 08/07/24 1303 08/11/24 1441 08/12/24 0612  NA 142 140 143  K 3.8 4.1 4.9  CL 103 101 105  CO2 34* 25 29  GLUCOSE 95 103* 139*  BUN 14 12 12   CREATININE 0.64 0.93 0.91  CALCIUM 9.2 9.5 9.0   GFR: Estimated Creatinine Clearance: 71 mL/min (by C-G formula based on SCr of 0.91 mg/dL). Liver Function Tests: Recent Labs  Lab 08/11/24 1441  AST 38  ALT 46*  ALKPHOS 122  BILITOT 0.4  PROT 7.7  ALBUMIN 4.1   Recent Labs  Lab 08/11/24 1441  LIPASE 10*   No results for input(s): AMMONIA in the last 168 hours. Coagulation Profile: Recent Labs  Lab 08/11/24 1441  INR 1.6*   Cardiac Enzymes: No results for input(s): CKTOTAL, CKMB, CKMBINDEX, TROPONINI in the last 168 hours. BNP (last 3 results) No results for input(s): PROBNP in the last 8760 hours. HbA1C: No results for input(s): HGBA1C in the last 72 hours. CBG: No results for input(s): GLUCAP in the last 168 hours. Lipid Profile: No results for input(s): CHOL, HDL, LDLCALC, TRIG, CHOLHDL, LDLDIRECT in the last 72 hours. Thyroid  Function Tests: No results for input(s): TSH, T4TOTAL, FREET4, T3FREE, THYROIDAB in the last 72 hours. Anemia Panel: No results for input(s): VITAMINB12, FOLATE, FERRITIN, TIBC, IRON , RETICCTPCT in the last 72 hours. Sepsis Labs: Recent Labs  Lab 08/11/24 1511 08/11/24 2302  PROCALCITON  --  0.11  LATICACIDVEN 1.2  --     Recent Results (from the past 240 hours)  Surgical pcr screen     Status: None   Collection Time: 08/11/24  2:26 PM   Specimen: Nasal Mucosa; Nasal Swab  Result Value  Ref Range Status   MRSA, PCR NEGATIVE NEGATIVE Final   Staphylococcus aureus NEGATIVE NEGATIVE Final    Comment: (NOTE) The Xpert SA Assay (FDA approved for NASAL specimens in patients 27 years of age and older), is one component of a comprehensive surveillance program. It is not intended to diagnose infection nor to guide or monitor treatment. Performed at [facility], [address], [place], [place] [address]   Culture, blood (Routine x 2)     Status: None (Preliminary result)   Collection Time: 08/11/24  3:05 PM   Specimen: BLOOD  Result Value Ref Range Status   Specimen Description   Final    BLOOD LEFT ANTECUBITAL Performed at [facility], [address], [place], [place] [address]    Special Requests   Final    BOTTLES DRAWN AEROBIC AND ANAEROBIC Blood Culture adequate volume Performed at [facility], [address] [place], [place] [address]    Culture  Setup Time   Final    GRAM POSITIVE COCCI IN CLUSTERS ANAEROBIC BOTTLE ONLY Organism  ID to follow Performed at [facility], [address] [place], [place] [address]    Culture GRAM POSITIVE COCCI IN CLUSTERS  Final   Report Status PENDING  Incomplete  Resp panel by RT-PCR (RSV, Flu A&B, Covid) Anterior Nasal Swab     Status: None   Collection Time: 08/11/24  3:52 PM   Specimen: Anterior Nasal Swab  Result Value Ref Range Status   SARS Coronavirus 2 by RT PCR NEGATIVE NEGATIVE Final    Comment: (NOTE) SARS-CoV-2 target nucleic acids are NOT DETECTED.  The SARS-CoV-2 RNA is generally detectable in upper respiratory specimens during the acute phase of infection. The lowest concentration of SARS-CoV-2 viral copies this assay can detect is 138 copies/mL. A negative result does not preclude SARS-Cov-2 infection and should not be used as the sole basis for treatment or other patient management decisions. A negative result may occur with  improper  specimen collection/handling, submission of specimen other than nasopharyngeal swab, presence of viral mutation(s) within the areas targeted by this assay, and inadequate number of viral copies(<138 copies/mL). A negative result must be combined with clinical observations, patient history, and epidemiological information. The expected result is Negative.  Fact Sheet for Patients:  BloggerCourse.com  Fact Sheet for Healthcare Providers:  SeriousBroker.it  This test is no t yet approved or cleared by the [place]  FDA and  has been authorized for detection and/or diagnosis of SARS-CoV-2 by FDA under an Emergency Use Authorization (EUA). This EUA will remain  in effect (meaning this test can be used) for the duration of the COVID-19 declaration under Section 564(b)(1) of the Act, 21 U.S.C.section 360bbb-3(b)(1), unless the authorization is terminated  or revoked sooner.       Influenza A by PCR NEGATIVE NEGATIVE Final   Influenza B by PCR NEGATIVE NEGATIVE Final    Comment: (NOTE) The Xpert Xpress SARS-CoV-2/FLU/RSV plus assay is intended as an aid in the diagnosis of influenza from Nasopharyngeal swab specimens and should not be used as a sole basis for treatment. Nasal washings and aspirates are unacceptable for Xpert Xpress SARS-CoV-2/FLU/RSV testing.  Fact Sheet for Patients: BloggerCourse.com  Fact Sheet for Healthcare Providers: SeriousBroker.it  This test is not yet approved or cleared by the [place]  FDA and has been authorized for detection and/or diagnosis of SARS-CoV-2 by FDA under an Emergency Use Authorization (EUA). This EUA will remain in effect (meaning this test can be used) for the duration of the COVID-19 declaration under Section 564(b)(1) of the Act, 21 U.S.C. section 360bbb-3(b)(1), unless the authorization is terminated or revoked.     Resp  Syncytial Virus by PCR NEGATIVE NEGATIVE Final    Comment: (NOTE) Fact Sheet for Patients: BloggerCourse.com  Fact Sheet for Healthcare Providers: SeriousBroker.it  This test is not yet approved or cleared by the [place]  FDA and has been authorized for detection and/or diagnosis of SARS-CoV-2 by FDA under an Emergency Use Authorization (EUA). This EUA will remain in effect (meaning this test can be used) for the duration of the COVID-19 declaration under Section 564(b)(1) of the Act, 21 U.S.C. section 360bbb-3(b)(1), unless the authorization is terminated or revoked.  Performed at [facility], [address] [place], [place] [address]   Culture, blood (Routine x 2)     Status: None (Preliminary result)   Collection Time: 08/11/24 11:02 PM   Specimen: BLOOD LEFT ARM  Result Value Ref Range Status   Specimen Description   Final    BLOOD LEFT  ARM Performed at [facility], [address] [place], [place] [address]    Special Requests   Final    BOTTLES DRAWN AEROBIC ONLY Blood Culture results may not be optimal due to an inadequate volume of blood received in culture bottles Performed at [facility], [address], [place], [place] [address]    Culture   Final    NO GROWTH < 12 HOURS Performed at [facility], [address], [place], [place] [address]    Report Status PENDING  Incomplete         Radiology Studies: US  EKG SITE RITE Result Date: 08/11/2024 If Site Rite image not attached, placement could not be confirmed due to current cardiac rhythm.  CT ABDOMEN PELVIS W CONTRAST Result Date: 08/11/2024 CLINICAL DATA:  Fever positive D-dimer EXAM: CT ANGIOGRAPHY CHEST CT ABDOMEN AND PELVIS WITH CONTRAST TECHNIQUE: Multidetector CT imaging of the chest was performed using the standard protocol during bolus administration of intravenous contrast. Multiplanar CT image  reconstructions and MIPs were obtained to evaluate the vascular anatomy. Multidetector CT imaging of the abdomen and pelvis was performed using the standard protocol during bolus administration of intravenous contrast. RADIATION DOSE REDUCTION: This exam was performed according to the departmental dose-optimization program which includes automated exposure control, adjustment of the mA and/or kV according to patient size and/or use of iterative reconstruction technique. CONTRAST:  OMNIPAQUE  IOHEXOL  350 MG/ML SOLN COMPARISON:  Chest x-ray 08/11/2024, CT 06/02/2024, CT chest 09/04/2023 FINDINGS: CTA CHEST FINDINGS Cardiovascular: Satisfactory opacification of the pulmonary arteries to the segmental level. No evidence of pulmonary embolism. Mild aortic atherosclerosis. Negative for aneurysm. Borderline to mild cardiomegaly. No significant pericardial effusion Mediastinum/Nodes: Patent trachea. No thyroid  mass. Multiple subcentimeter mediastinal lymph nodes. Moderate to large hiatal hernia. Surgical changes at the diaphragmatic hiatus. Lungs/Pleura: Heterogeneous left greater than right ground-glass airspace disease and patchy foci of consolidation. No pleural effusion or pneumothorax. Musculoskeletal: Sternum appears intact. No acute osseous abnormality. Review of the MIP images confirms the above findings. CT ABDOMEN and PELVIS FINDINGS Hepatobiliary: No focal liver abnormality is seen. No gallstones, gallbladder wall thickening, or biliary dilatation. Pancreas: Unremarkable. No pancreatic ductal dilatation or surrounding inflammatory changes. Spleen: Normal in size without focal abnormality. Adrenals/Urinary Tract: Adrenal glands are normal. Kidneys show no hydronephrosis. Subcentimeter hypodensities too small to further characterize, no imaging follow-up is recommended. The bladder is unremarkable Stomach/Bowel: Stomach is within normal limits. Moderate stool burden. No evidence of bowel wall thickening,  distention, or inflammatory changes. Vascular/Lymphatic: Nonaneurysmal aorta. No suspicious lymph nodes. IVC filter with apex below the renal vein confluence. Reproductive: Status post hysterectomy. No adnexal masses. Other: Negative for pelvic effusion or free air. Prior hernia repair. Musculoskeletal: No acute or suspicious osseous abnormality. Degenerative changes Review of the MIP images confirms the above findings. IMPRESSION: 1. Negative for acute pulmonary embolus. 2. Heterogeneous left greater than right ground-glass airspace disease and patchy foci of consolidation, consistent with multifocal pneumonia. 3. No CT evidence for acute intra-abdominal or pelvic abnormality. 4. Moderate to large hiatal hernia. 5. Aortic atherosclerosis. Aortic Atherosclerosis (ICD10-I70.0). Electronically Signed   By: Luke Bun M.D.   On: 08/11/2024 17:16   CT Angio Chest PE W/Cm &/Or Wo Cm Result Date: 08/11/2024 CLINICAL DATA:  Fever positive D-dimer EXAM: CT ANGIOGRAPHY CHEST CT ABDOMEN AND PELVIS WITH CONTRAST TECHNIQUE: Multidetector CT imaging of the chest was performed using the standard protocol during bolus administration of intravenous contrast. Multiplanar CT image reconstructions and  MIPs were obtained to evaluate the vascular anatomy. Multidetector CT imaging of the abdomen and pelvis was performed using the standard protocol during bolus administration of intravenous contrast. RADIATION DOSE REDUCTION: This exam was performed according to the departmental dose-optimization program which includes automated exposure control, adjustment of the mA and/or kV according to patient size and/or use of iterative reconstruction technique. CONTRAST:  OMNIPAQUE  IOHEXOL  350 MG/ML SOLN COMPARISON:  Chest x-ray 08/11/2024, CT 06/02/2024, CT chest 09/04/2023 FINDINGS: CTA CHEST FINDINGS Cardiovascular: Satisfactory opacification of the pulmonary arteries to the segmental level. No evidence of pulmonary embolism. Mild  aortic atherosclerosis. Negative for aneurysm. Borderline to mild cardiomegaly. No significant pericardial effusion Mediastinum/Nodes: Patent trachea. No thyroid  mass. Multiple subcentimeter mediastinal lymph nodes. Moderate to large hiatal hernia. Surgical changes at the diaphragmatic hiatus. Lungs/Pleura: Heterogeneous left greater than right ground-glass airspace disease and patchy foci of consolidation. No pleural effusion or pneumothorax. Musculoskeletal: Sternum appears intact. No acute osseous abnormality. Review of the MIP images confirms the above findings. CT ABDOMEN and PELVIS FINDINGS Hepatobiliary: No focal liver abnormality is seen. No gallstones, gallbladder wall thickening, or biliary dilatation. Pancreas: Unremarkable. No pancreatic ductal dilatation or surrounding inflammatory changes. Spleen: Normal in size without focal abnormality. Adrenals/Urinary Tract: Adrenal glands are normal. Kidneys show no hydronephrosis. Subcentimeter hypodensities too small to further characterize, no imaging follow-up is recommended. The bladder is unremarkable Stomach/Bowel: Stomach is within normal limits. Moderate stool burden. No evidence of bowel wall thickening, distention, or inflammatory changes. Vascular/Lymphatic: Nonaneurysmal aorta. No suspicious lymph nodes. IVC filter with apex below the renal vein confluence. Reproductive: Status post hysterectomy. No adnexal masses. Other: Negative for pelvic effusion or free air. Prior hernia repair. Musculoskeletal: No acute or suspicious osseous abnormality. Degenerative changes Review of the MIP images confirms the above findings. IMPRESSION: 1. Negative for acute pulmonary embolus. 2. Heterogeneous left greater than right ground-glass airspace disease and patchy foci of consolidation, consistent with multifocal pneumonia. 3. No CT evidence for acute intra-abdominal or pelvic abnormality. 4. Moderate to large hiatal hernia. 5. Aortic atherosclerosis. Aortic  Atherosclerosis (ICD10-I70.0). Electronically Signed   By: Luke Bun M.D.   On: 08/11/2024 17:16   CT Head Wo Contrast Result Date: 08/11/2024 CLINICAL DATA:  Headache, new onset (Age >= 51y) EXAM: CT HEAD WITHOUT CONTRAST TECHNIQUE: Contiguous axial images were obtained from the base of the skull through the vertex without intravenous contrast. RADIATION DOSE REDUCTION: This exam was performed according to the departmental dose-optimization program which includes automated exposure control, adjustment of the mA and/or kV according to patient size and/or use of iterative reconstruction technique. COMPARISON:  None Available. FINDINGS: Brain: No evidence of acute infarction, hemorrhage, hydrocephalus, extra-axial collection or mass lesion/mass effect. Vascular: No hyperdense vessel. Skull: No acute fracture. Sinuses/Orbits: No acute orbital findings.  Clear sinuses. Other: No mastoid effusions. IMPRESSION: No evidence of acute intracranial abnormality. Electronically Signed   By: Gilmore GORMAN Molt M.D.   On: 08/11/2024 17:04   DG Chest Portable 1 View Result Date: 08/11/2024 EXAM: 1 VIEW XRAY OF THE CHEST 08/11/2024 03:25:00 PM COMPARISON: 06/14/2024 CLINICAL HISTORY: Fever; Per notes: Patient report fever, chills and headache while at Pre op check up. Patient report took tylenol  this morning without relief. Patient denies chest pain and SOB. Patient report nausea denies vomiting. FINDINGS: LUNGS AND PLEURA: Low lung volumes accentuate cardiomediastinal silhouette and pulmonary vascularity. Increasing hazy opacities at lung bases, representing possible atelectasis or developing pneumonia. Atypical infection not excluded. No pleural effusion. No pneumothorax. HEART AND MEDIASTINUM: No  acute abnormality of the cardiac and mediastinal silhouettes. BONES AND SOFT TISSUES: No acute osseous abnormality. IMPRESSION: 1. Increasing hazy opacities at lung bases, representing possible atelectasis or developing  pneumonia. Atypical infection not excluded. Electronically signed by: Lonni Necessary MD 08/11/2024 03:47 PM EDT RP Workstation: [id number]        Scheduled Meds:  ascorbic acid   1,000 mg Oral Daily   b complex vitamins  1 capsule Oral Daily   Chlorhexidine  Gluconate Cloth  6 each Topical Daily   DULoxetine   60 mg Oral QHS   famotidine   20 mg Oral Daily   fluconazole   150 mg Oral Once   gabapentin   400 mg Oral BID   pantoprazole   40 mg Oral Q breakfast   polyethylene glycol  17 g Oral Daily   rivaroxaban   20 mg Oral Q supper   senna  1 tablet Oral Daily   Continuous Infusions:  cefTRIAXone  (ROCEPHIN )  IV 1 g (08/12/24 1038)     LOS: 1 day    Time spent: 52 minutes    Renato Applebaum, MD [facility]

## 2024-08-12 NOTE — Plan of Care (Signed)

## 2024-08-13 DIAGNOSIS — J13 Pneumonia due to Streptococcus pneumoniae: Secondary | ICD-10-CM | POA: Diagnosis not present

## 2024-08-13 LAB — LEGIONELLA PNEUMOPHILA SEROGP 1 UR AG: L. pneumophila Serogp 1 Ur Ag: NEGATIVE

## 2024-08-13 MED ORDER — OXYCODONE-ACETAMINOPHEN 5-325 MG PO TABS
2.0000 | ORAL_TABLET | Freq: Four times a day (QID) | ORAL | Status: DC | PRN
  Administered 2024-08-13 – 2024-08-15 (×7): 2 via ORAL
  Filled 2024-08-13 (×7): qty 2

## 2024-08-13 MED ORDER — ENOXAPARIN SODIUM 100 MG/ML IJ SOSY
100.0000 mg | PREFILLED_SYRINGE | Freq: Two times a day (BID) | INTRAMUSCULAR | Status: DC
  Administered 2024-08-13 – 2024-08-15 (×4): 100 mg via SUBCUTANEOUS
  Filled 2024-08-13 (×4): qty 1

## 2024-08-13 NOTE — Plan of Care (Signed)

## 2024-08-13 NOTE — Progress Notes (Signed)
 PHARMACY - ANTICOAGULATION CONSULT NOTE  Pharmacy Consult for enoxaparin  Indication: history of PE/DVT, rivaroxaban  on hold  Allergies  Allergen Reactions   Belbuca  [Buprenorphine  Hcl] Other (See Comments)     Horrible headache    Pregabalin Other (See Comments)    Paralysis present (finding)- not tolerated The patient stated she could not move her body, but she could think.   Compazine  [Prochlorperazine ] Anxiety   Droperidol Anxiety   Ergotamine-Caffeine Anxiety, Other (See Comments) and Hypertension    Increased systolic arterial pressure (finding)  (caffeine / ergotamine)    Patient Measurements: Height: 5' 5 (165.1 cm) Weight: 102.1 kg (225 lb) IBW/kg (Calculated) : 57 HEPARIN  DW (KG): 80.5  Vital Signs: Temp: 98.2 F (36.8 C) (08/31 0455) Temp Source: Oral (08/31 0455) BP: 117/61 (08/31 0455) Pulse Rate: 82 (08/31 0455)  Labs: Recent Labs    08/11/24 1441 08/12/24 0612  HGB 15.4* 12.6  HCT 50.6* 42.2  PLT 193 137*  LABPROT 19.5*  --   INR 1.6*  --   CREATININE 0.93 0.91    Estimated Creatinine Clearance: 71 mL/min (by C-G formula based on SCr of 0.91 mg/dL).   Medical History: Past Medical History:  Diagnosis Date   Acute respiratory failure with hypoxia (HCC) 03/23/2022   Acute respiratory failure with hypoxia (HCC) 03/23/2022   Anemia    Anxiety    Arthritis    CAD (coronary artery disease)    pt deneies   COPD (chronic obstructive pulmonary disease) (HCC)    Depression    Dyspnea    Fibromyalgia    Generalized anxiety disorder 12/11/2022   GERD (gastroesophageal reflux disease)    Hiatal hernia    History of kidney stones    Lung nodule 03/24/2022   Migraine headache    Multinodular goiter    Nephrolithiasis    Obstructive sleep apnea syndrome 07/01/2021   Opioid dependence (HCC) 03/23/2022   Pneumonia    Pulmonary embolism (HCC) 1996   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 Pt admitted for PNA. Pt chronically anticoagulated with rivaroxaban  for history of PE/DVT. Pharmacy consulted to transition anticoagulation to enoxaparin .   Today, 08/13/24 CBC WNL SCr <1  Goal of Therapy:  Monitor platelets by anticoagulation protocol: Yes   Plan:  Start enoxaparin  24 hours after last dose of rivaroxaban  Enoxaparin  1 mg/kg (100 mg) subQ q12h Ortho to determine when to hold LMWH pre-op Check CBC, SCr with AM labs tomorrow  Ronal CHRISTELLA Rav, PharmD 08/13/2024,10:49 AM

## 2024-08-13 NOTE — Progress Notes (Signed)
 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  In the emergency room she had temperature 100.9, heart rate 120, blood pressure stable.  85% on room air requiring 2 L oxygen.  WBC count 14.2.  Respiratory panel negative.  Admitted for possible infection, likely pneumonia.  Subjective: Patient seen and examined.  Just does not feel well.  Remains afebrile.  No respiratory symptoms. Patient is very insisting on using IV Dilaudid .  She also wants to use IV Benadryl  and IV Phenergan  for headache. We discussed about not using injectable pain medications for chronic pain, she asked to change Dilaudid  to Percocet.  Assessment & Plan:   Sepsis present on admission likely secondary to multifocal pneumonia.  Acute on chronic hypoxemic respiratory failure. Presented with tachycardia, fever and leukocytosis. CT scan of the chest with bilateral groundglass opacities consistent with multifocal pneumonia versus atelectasis. Viral panel negative. Currently on Rocephin  and doxycycline .  Now on vancomycin . Chest physiotherapy, incentive spirometry, deep breathing exercises, sputum induction, mucolytic's and bronchodilators. Blood cultures, aerobic bottle only with gram-positive cocci in clusters.  Likely contaminant.  Patient does have history of MSSA bacteremia, giving vancomycin . Sputum cultures, Legionella and streptococcal antigen.  Supplemental oxygen to keep saturations more than  90%. Mobilize with PT OT.  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  Patient is scheduled for right knee arthroplasty.  Orthopedics aware.  She can likely have surgery if no systemic infection.  Await final identification of gram-positive cocci.  Fibromyalgia and chronic pain syndrome: On multiple medications with Xanax , Cymbalta , gabapentin , oral Dilaudid  and Zanaflex .  Avoid IV pain medications for chronic pain.  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  Stop Xarelto  today.  Will give patient Lovenox .  Orthopedics to decide when to hold Lovenox .  Sleep apnea on CPAP.    DVT prophylaxis: Lovenox    Code Status: Full code Family Communication: None at the bedside Disposition Plan: Status is: Inpatient Remains inpatient appropriate because: IV antibiotics     Consultants:  None  Procedures:  None  Antimicrobials:  Rocephin  and vancomycin  8/29---     Objective: Vitals:   08/12/24 1305 08/12/24 2029 08/13/24 0030 08/13/24 0455  BP: 108/70 122/76  117/61  Pulse: 80 93  82  Resp: 16 12  16   Temp: 98.3 F (36.8 C) (!) 97.5 F (36.4 C) 98.4 F (36.9 C) 98.2 F (36.8 C)  TempSrc: Oral Oral Oral Oral  SpO2: 94% 90%  (!) 89%  Weight:      Height:        Intake/Output Summary (Last 24 hours) at 08/13/2024 1159 Last data filed at 08/13/2024 1033 Gross per 24 hour  Intake 690.09 ml  Output --  Net 690.09 ml   Filed Weights   08/11/24 1330  Weight: 102.1 kg    Examination:  General exam: Looks fairly comfortable. Respiratory system: Clear to auscultation. Respiratory effort normal.  Mostly on  room air. Cardiovascular system: S1 & S2 heard, RRR. No JVD, murmurs, rubs, gallops or clicks. No pedal edema. Gastrointestinal system: Abdomen is nondistended, soft and nontender. No organomegaly or masses felt. Normal bowel sounds heard. Central nervous system: Alert and oriented. No focal neurological  deficits. Extremities: Symmetric 5 x 5 power. Deformity with muscle wasting on the left knee.    Data Reviewed: I have personally reviewed following labs and imaging studies  CBC: Recent Labs  Lab 08/07/24 1303 08/11/24 1441 08/12/24 0612  WBC 7.7 14.2* 8.1  NEUTROABS 4.4 11.4*  --   HGB 14.2 15.4* 12.6  HCT 44.8 50.6* 42.2  MCV 97.0 99.2 105.0*  PLT 168 193 137*   Basic Metabolic Panel: Recent Labs  Lab 08/07/24 1303 08/11/24 1441 08/12/24 0612  NA 142 140 143  K 3.8 4.1 4.9  CL 103 101 105  CO2 34* 25 29  GLUCOSE 95 103* 139*  BUN 14 12 12   CREATININE 0.64 0.93 0.91  CALCIUM 9.2 9.5 9.0   GFR: Estimated Creatinine Clearance: 71 mL/min (by C-G formula based on SCr of 0.91 mg/dL). Liver Function Tests: Recent Labs  Lab 08/11/24 1441  AST 38  ALT 46*  ALKPHOS 122  BILITOT 0.4  PROT 7.7  ALBUMIN 4.1   Recent Labs  Lab 08/11/24 1441  LIPASE 10*   No results for input(s): AMMONIA in the last 168 hours. Coagulation Profile: Recent Labs  Lab 08/11/24 1441  INR 1.6*   Cardiac Enzymes: No results for input(s): CKTOTAL, CKMB, CKMBINDEX, TROPONINI in the last 168 hours. BNP (last 3 results) No results for input(s): PROBNP in the last 8760 hours. HbA1C: No results for input(s): HGBA1C in the last 72 hours. CBG: No results for input(s): GLUCAP in the last 168 hours. Lipid Profile: No results for input(s): CHOL, HDL, LDLCALC, TRIG, CHOLHDL, LDLDIRECT in the last 72 hours. Thyroid  Function Tests: No results for input(s): TSH, T4TOTAL, FREET4, T3FREE, THYROIDAB in the last 72 hours. Anemia Panel: No results for input(s): VITAMINB12, FOLATE, FERRITIN, TIBC, IRON , RETICCTPCT in the last 72 hours. Sepsis Labs: Recent Labs  Lab 08/11/24 1511 08/11/24 2302  PROCALCITON  --  0.11  LATICACIDVEN 1.2  --     Recent Results (from the past 240 hours)  Surgical pcr screen     Status: None   Collection Time:  08/11/24  2:26 PM   Specimen: Nasal Mucosa; Nasal Swab  Result Value Ref Range Status   MRSA, PCR NEGATIVE NEGATIVE Final   Staphylococcus aureus NEGATIVE NEGATIVE Final    Comment: (NOTE) The Xpert SA Assay (FDA approved for NASAL specimens in patients 72 years of age and older), is one component of a comprehensive surveillance program. It is not intended to diagnose infection nor to guide or monitor treatment. Performed at [facility], [address], [place], [place] [address]   Culture, blood (Routine x 2)     Status: None (Preliminary result)   Collection Time: 08/11/24  3:05 PM   Specimen: BLOOD  Result Value Ref Range Status   Specimen Description   Final    BLOOD LEFT ANTECUBITAL Performed at [facility], [address], [place], [place] [address]    Special Requests   Final    BOTTLES DRAWN AEROBIC AND ANAEROBIC Blood Culture adequate volume Performed at [facility], [address], [place], [place] [address]    Culture  Setup Time   Final    GRAM POSITIVE COCCI IN CLUSTERS  IN BOTH AEROBIC AND ANAEROBIC BOTTLES CRITICAL RESULT CALLED TO, READ BACK BY AND VERIFIED WITH: PHARMD MELISSA JAMES [id number] AT 1530 BY EC    Culture   Final    GRAM POSITIVE COCCI IN CLUSTERS IDENTIFICATION TO FOLLOW Performed at [facility], [address] [place], [place] [address]    Report Status PENDING  Incomplete  Blood Culture ID Panel (Reflexed)     Status: Abnormal   Collection Time: 08/11/24  3:05 PM  Result Value Ref Range Status   Enterococcus faecalis NOT DETECTED NOT DETECTED Final   Enterococcus Faecium NOT DETECTED NOT DETECTED Final   Listeria monocytogenes NOT DETECTED NOT DETECTED Final   Staphylococcus species DETECTED (A) NOT DETECTED Final    Comment: RBV PHARMD MELISSA JAMES [id number] AT 1530 BY EC    Staphylococcus aureus (BCID) NOT DETECTED NOT DETECTED Final   Staphylococcus epidermidis NOT  DETECTED NOT DETECTED Final   Staphylococcus lugdunensis NOT DETECTED NOT DETECTED Final   Streptococcus species NOT DETECTED NOT DETECTED Final   Streptococcus agalactiae NOT DETECTED NOT DETECTED Final   Streptococcus pneumoniae NOT DETECTED NOT DETECTED Final   Streptococcus pyogenes NOT DETECTED NOT DETECTED Final   A.calcoaceticus-baumannii NOT DETECTED NOT DETECTED Final   Bacteroides fragilis NOT DETECTED NOT DETECTED Final   Enterobacterales NOT DETECTED NOT DETECTED Final   Enterobacter cloacae complex NOT DETECTED NOT DETECTED Final   Escherichia coli NOT DETECTED NOT DETECTED Final   Klebsiella aerogenes NOT DETECTED NOT DETECTED Final   Klebsiella oxytoca NOT DETECTED NOT DETECTED Final   Klebsiella pneumoniae NOT DETECTED NOT DETECTED Final   Proteus species NOT DETECTED NOT DETECTED Final   Salmonella species NOT DETECTED NOT DETECTED Final   Serratia marcescens NOT DETECTED NOT DETECTED Final   Haemophilus influenzae NOT DETECTED NOT DETECTED Final   Neisseria meningitidis NOT DETECTED NOT DETECTED Final   Pseudomonas aeruginosa NOT DETECTED NOT DETECTED Final   Stenotrophomonas maltophilia NOT DETECTED NOT DETECTED Final   Candida albicans NOT DETECTED NOT DETECTED Final   Candida auris NOT DETECTED NOT DETECTED Final   Candida glabrata NOT DETECTED NOT DETECTED Final   Candida krusei NOT DETECTED NOT DETECTED Final   Candida parapsilosis NOT DETECTED NOT DETECTED Final   Candida tropicalis NOT DETECTED NOT DETECTED Final   Cryptococcus neoformans/gattii NOT DETECTED NOT DETECTED Final    Comment: Performed at [facility], [address], [place], [place] [address]  Resp panel by RT-PCR (RSV, Flu A&B, Covid) Anterior Nasal Swab     Status: None   Collection Time: 08/11/24  3:52 PM   Specimen: Anterior Nasal Swab  Result Value Ref Range Status   SARS Coronavirus 2 by RT PCR NEGATIVE NEGATIVE Final    Comment: (NOTE) SARS-CoV-2 target nucleic acids are NOT  DETECTED.  The SARS-CoV-2 RNA is generally detectable in upper respiratory specimens during the acute phase of infection. The lowest concentration of SARS-CoV-2 viral copies this assay can detect is 138 copies/mL. A negative result does not preclude SARS-Cov-2 infection and should not be used as the sole basis for treatment or other patient management decisions. A negative result may occur with  improper specimen collection/handling, submission of specimen other than nasopharyngeal swab, presence of viral mutation(s) within the areas targeted by this assay, and inadequate number of viral copies(<138 copies/mL). A negative result must be combined with clinical observations, patient history, and epidemiological information. The expected result is Negative.  Fact Sheet for Patients:  BloggerCourse.com  Fact Sheet for  Healthcare Providers:  SeriousBroker.it  This test is no t yet approved or cleared by the [place]  FDA and  has been authorized for detection and/or diagnosis of SARS-CoV-2 by FDA under an Emergency Use Authorization (EUA). This EUA will remain  in effect (meaning this test can be used) for the duration of the COVID-19 declaration under Section 564(b)(1) of the Act, 21 U.S.C.section 360bbb-3(b)(1), unless the authorization is terminated  or revoked sooner.       Influenza A by PCR NEGATIVE NEGATIVE Final   Influenza B by PCR NEGATIVE NEGATIVE Final    Comment: (NOTE) The Xpert Xpress SARS-CoV-2/FLU/RSV plus assay is intended as an aid in the diagnosis of influenza from Nasopharyngeal swab specimens and should not be used as a sole basis for treatment. Nasal washings and aspirates are unacceptable for Xpert Xpress SARS-CoV-2/FLU/RSV testing.  Fact Sheet for Patients: BloggerCourse.com  Fact Sheet for Healthcare Providers: SeriousBroker.it  This test is not yet  approved or cleared by the [place]  FDA and has been authorized for detection and/or diagnosis of SARS-CoV-2 by FDA under an Emergency Use Authorization (EUA). This EUA will remain in effect (meaning this test can be used) for the duration of the COVID-19 declaration under Section 564(b)(1) of the Act, 21 U.S.C. section 360bbb-3(b)(1), unless the authorization is terminated or revoked.     Resp Syncytial Virus by PCR NEGATIVE NEGATIVE Final    Comment: (NOTE) Fact Sheet for Patients: BloggerCourse.com  Fact Sheet for Healthcare Providers: SeriousBroker.it  This test is not yet approved or cleared by the [place]  FDA and has been authorized for detection and/or diagnosis of SARS-CoV-2 by FDA under an Emergency Use Authorization (EUA). This EUA will remain in effect (meaning this test can be used) for the duration of the COVID-19 declaration under Section 564(b)(1) of the Act, 21 U.S.C. section 360bbb-3(b)(1), unless the authorization is terminated or revoked.  Performed at [facility], [address], [place], [place] [address]   Culture, blood (Routine x 2)     Status: None (Preliminary result)   Collection Time: 08/11/24 11:02 PM   Specimen: BLOOD LEFT ARM  Result Value Ref Range Status   Specimen Description   Final    BLOOD LEFT ARM Performed at [facility], [address] [place], [place] [address]    Special Requests   Final    BOTTLES DRAWN AEROBIC ONLY Blood Culture results may not be optimal due to an inadequate volume of blood received in culture bottles Performed at [facility], [address], [place], [place] [address]    Culture   Final    NO GROWTH 1 DAY Performed at [facility], [address], [place], [place] [address]    Report Status PENDING  Incomplete         Radiology Studies: US  EKG SITE RITE Result Date: 08/11/2024 If Site Rite  image not attached, placement could not be confirmed due to current cardiac rhythm.  CT ABDOMEN PELVIS W CONTRAST Result Date: 08/11/2024 CLINICAL DATA:  Fever positive D-dimer EXAM: CT ANGIOGRAPHY CHEST CT ABDOMEN AND PELVIS WITH CONTRAST TECHNIQUE: Multidetector CT imaging of the chest was performed using the standard protocol during bolus administration of intravenous contrast. Multiplanar CT image reconstructions and MIPs were obtained to evaluate the vascular anatomy. Multidetector CT imaging of the abdomen and pelvis was performed using the standard protocol during bolus administration of intravenous contrast. RADIATION DOSE REDUCTION: This exam was performed according to the  departmental dose-optimization program which includes automated exposure control, adjustment of the mA and/or kV according to patient size and/or use of iterative reconstruction technique. CONTRAST:  OMNIPAQUE  IOHEXOL  350 MG/ML SOLN COMPARISON:  Chest x-ray 08/11/2024, CT 06/02/2024, CT chest 09/04/2023 FINDINGS: CTA CHEST FINDINGS Cardiovascular: Satisfactory opacification of the pulmonary arteries to the segmental level. No evidence of pulmonary embolism. Mild aortic atherosclerosis. Negative for aneurysm. Borderline to mild cardiomegaly. No significant pericardial effusion Mediastinum/Nodes: Patent trachea. No thyroid  mass. Multiple subcentimeter mediastinal lymph nodes. Moderate to large hiatal hernia. Surgical changes at the diaphragmatic hiatus. Lungs/Pleura: Heterogeneous left greater than right ground-glass airspace disease and patchy foci of consolidation. No pleural effusion or pneumothorax. Musculoskeletal: Sternum appears intact. No acute osseous abnormality. Review of the MIP images confirms the above findings. CT ABDOMEN and PELVIS FINDINGS Hepatobiliary: No focal liver abnormality is seen. No gallstones, gallbladder wall thickening, or biliary dilatation. Pancreas: Unremarkable. No pancreatic ductal dilatation or  surrounding inflammatory changes. Spleen: Normal in size without focal abnormality. Adrenals/Urinary Tract: Adrenal glands are normal. Kidneys show no hydronephrosis. Subcentimeter hypodensities too small to further characterize, no imaging follow-up is recommended. The bladder is unremarkable Stomach/Bowel: Stomach is within normal limits. Moderate stool burden. No evidence of bowel wall thickening, distention, or inflammatory changes. Vascular/Lymphatic: Nonaneurysmal aorta. No suspicious lymph nodes. IVC filter with apex below the renal vein confluence. Reproductive: Status post hysterectomy. No adnexal masses. Other: Negative for pelvic effusion or free air. Prior hernia repair. Musculoskeletal: No acute or suspicious osseous abnormality. Degenerative changes Review of the MIP images confirms the above findings. IMPRESSION: 1. Negative for acute pulmonary embolus. 2. Heterogeneous left greater than right ground-glass airspace disease and patchy foci of consolidation, consistent with multifocal pneumonia. 3. No CT evidence for acute intra-abdominal or pelvic abnormality. 4. Moderate to large hiatal hernia. 5. Aortic atherosclerosis. Aortic Atherosclerosis (ICD10-I70.0). Electronically Signed   By: Luke Bun M.D.   On: 08/11/2024 17:16   CT Angio Chest PE W/Cm &/Or Wo Cm Result Date: 08/11/2024 CLINICAL DATA:  Fever positive D-dimer EXAM: CT ANGIOGRAPHY CHEST CT ABDOMEN AND PELVIS WITH CONTRAST TECHNIQUE: Multidetector CT imaging of the chest was performed using the standard protocol during bolus administration of intravenous contrast. Multiplanar CT image reconstructions and MIPs were obtained to evaluate the vascular anatomy. Multidetector CT imaging of the abdomen and pelvis was performed using the standard protocol during bolus administration of intravenous contrast. RADIATION DOSE REDUCTION: This exam was performed according to the departmental dose-optimization program which includes automated  exposure control, adjustment of the mA and/or kV according to patient size and/or use of iterative reconstruction technique. CONTRAST:  OMNIPAQUE  IOHEXOL  350 MG/ML SOLN COMPARISON:  Chest x-ray 08/11/2024, CT 06/02/2024, CT chest 09/04/2023 FINDINGS: CTA CHEST FINDINGS Cardiovascular: Satisfactory opacification of the pulmonary arteries to the segmental level. No evidence of pulmonary embolism. Mild aortic atherosclerosis. Negative for aneurysm. Borderline to mild cardiomegaly. No significant pericardial effusion Mediastinum/Nodes: Patent trachea. No thyroid  mass. Multiple subcentimeter mediastinal lymph nodes. Moderate to large hiatal hernia. Surgical changes at the diaphragmatic hiatus. Lungs/Pleura: Heterogeneous left greater than right ground-glass airspace disease and patchy foci of consolidation. No pleural effusion or pneumothorax. Musculoskeletal: Sternum appears intact. No acute osseous abnormality. Review of the MIP images confirms the above findings. CT ABDOMEN and PELVIS FINDINGS Hepatobiliary: No focal liver abnormality is seen. No gallstones, gallbladder wall thickening, or biliary dilatation. Pancreas: Unremarkable. No pancreatic ductal dilatation or surrounding inflammatory changes. Spleen: Normal in size without focal abnormality. Adrenals/Urinary Tract: Adrenal glands are normal. Kidneys  show no hydronephrosis. Subcentimeter hypodensities too small to further characterize, no imaging follow-up is recommended. The bladder is unremarkable Stomach/Bowel: Stomach is within normal limits. Moderate stool burden. No evidence of bowel wall thickening, distention, or inflammatory changes. Vascular/Lymphatic: Nonaneurysmal aorta. No suspicious lymph nodes. IVC filter with apex below the renal vein confluence. Reproductive: Status post hysterectomy. No adnexal masses. Other: Negative for pelvic effusion or free air. Prior hernia repair. Musculoskeletal: No acute or suspicious osseous abnormality.  Degenerative changes Review of the MIP images confirms the above findings. IMPRESSION: 1. Negative for acute pulmonary embolus. 2. Heterogeneous left greater than right ground-glass airspace disease and patchy foci of consolidation, consistent with multifocal pneumonia. 3. No CT evidence for acute intra-abdominal or pelvic abnormality. 4. Moderate to large hiatal hernia. 5. Aortic atherosclerosis. Aortic Atherosclerosis (ICD10-I70.0). Electronically Signed   By: Luke Bun M.D.   On: 08/11/2024 17:16   CT Head Wo Contrast Result Date: 08/11/2024 CLINICAL DATA:  Headache, new onset (Age >= 51y) EXAM: CT HEAD WITHOUT CONTRAST TECHNIQUE: Contiguous axial images were obtained from the base of the skull through the vertex without intravenous contrast. RADIATION DOSE REDUCTION: This exam was performed according to the departmental dose-optimization program which includes automated exposure control, adjustment of the mA and/or kV according to patient size and/or use of iterative reconstruction technique. COMPARISON:  None Available. FINDINGS: Brain: No evidence of acute infarction, hemorrhage, hydrocephalus, extra-axial collection or mass lesion/mass effect. Vascular: No hyperdense vessel. Skull: No acute fracture. Sinuses/Orbits: No acute orbital findings.  Clear sinuses. Other: No mastoid effusions. IMPRESSION: No evidence of acute intracranial abnormality. Electronically Signed   By: Gilmore GORMAN Molt M.D.   On: 08/11/2024 17:04   DG Chest Portable 1 View Result Date: 08/11/2024 EXAM: 1 VIEW XRAY OF THE CHEST 08/11/2024 03:25:00 PM COMPARISON: 06/14/2024 CLINICAL HISTORY: Fever; Per notes: Patient report fever, chills and headache while at Pre op check up. Patient report took tylenol  this morning without relief. Patient denies chest pain and SOB. Patient report nausea denies vomiting. FINDINGS: LUNGS AND PLEURA: Low lung volumes accentuate cardiomediastinal silhouette and pulmonary vascularity. Increasing hazy  opacities at lung bases, representing possible atelectasis or developing pneumonia. Atypical infection not excluded. No pleural effusion. No pneumothorax. HEART AND MEDIASTINUM: No acute abnormality of the cardiac and mediastinal silhouettes. BONES AND SOFT TISSUES: No acute osseous abnormality. IMPRESSION: 1. Increasing hazy opacities at lung bases, representing possible atelectasis or developing pneumonia. Atypical infection not excluded. Electronically signed by: Lonni Necessary MD 08/11/2024 03:47 PM EDT RP Workstation: [id number]        Scheduled Meds:  ascorbic acid   1,000 mg Oral Daily   B-complex with vitamin C   1 tablet Oral Daily   Chlorhexidine  Gluconate Cloth  6 each Topical Daily   DULoxetine   60 mg Oral QHS   enoxaparin  (LOVENOX ) injection  100 mg Subcutaneous Q12H   famotidine   20 mg Oral QPC supper   gabapentin   400 mg Oral BID   pantoprazole   40 mg Oral Q breakfast   polyethylene glycol  17 g Oral Daily   senna  1 tablet Oral Daily   Continuous Infusions:  cefTRIAXone  (ROCEPHIN )  IV Stopped (08/13/24 0842)   vancomycin  Stopped (08/12/24 1745)     LOS: 2 days    Time spent: 52 minutes    Renato Applebaum, MD [facility]

## 2024-08-14 DIAGNOSIS — J13 Pneumonia due to Streptococcus pneumoniae: Secondary | ICD-10-CM | POA: Diagnosis not present

## 2024-08-14 LAB — CBC
HCT: 42.4 % (ref 36.0–46.0)
Hemoglobin: 12.5 g/dL (ref 12.0–15.0)
MCH: 30 pg (ref 26.0–34.0)
MCHC: 29.5 g/dL — ABNORMAL LOW (ref 30.0–36.0)
MCV: 101.7 fL — ABNORMAL HIGH (ref 80.0–100.0)
Platelets: 142 K/uL — ABNORMAL LOW (ref 150–400)
RBC: 4.17 MIL/uL (ref 3.87–5.11)
RDW: 14.9 % (ref 11.5–15.5)
WBC: 7.6 K/uL (ref 4.0–10.5)
nRBC: 0 % (ref 0.0–0.2)

## 2024-08-14 LAB — CULTURE, BLOOD (ROUTINE X 2): Special Requests: ADEQUATE

## 2024-08-14 LAB — CREATININE, SERUM
Creatinine, Ser: 0.64 mg/dL (ref 0.44–1.00)
GFR, Estimated: >60 mL/min (ref >60)

## 2024-08-14 NOTE — Progress Notes (Signed)
 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  In the emergency room she had temperature 100.9, heart rate 120, blood pressure stable.  85% on room air requiring 2 L oxygen.  WBC count 14.2.  Respiratory panel negative.  Admitted for possible infection, likely pneumonia.  Subjective: Patient seen and examined.  No overnight events.  No cough or sputum production.  Afebrile.  Getting less headaches now.  Percocet helping. She is wondering whether she can stay in the hospital until right total knee on 9/4.  Message sent to her orthopedics about surgical decisions.  Assessment & Plan:   Sepsis present on admission likely secondary to multifocal pneumonia.  Acute on chronic hypoxemic respiratory failure. Presented with tachycardia, fever and leukocytosis. CT scan of the chest with bilateral groundglass opacities consistent with multifocal pneumonia versus atelectasis. Viral panel negative. Treated with Rocephin  and doxycycline  then with vancomycin . Blood cultures with coag negative.  Likely contaminant. Currently on Rocephin .  Will complete 5 days of therapy.  Patient on chronic Keflex  therapy, will resume on discharge.  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  Patient is scheduled for right knee arthroplasty.  Orthopedics aware.  She can likely have surgery if no systemic infection.    Fibromyalgia and chronic pain syndrome: On  multiple medications with Xanax , Cymbalta , gabapentin , oral Dilaudid  and Zanaflex .  Avoid IV pain medications for chronic pain.  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  Stop Xarelto  today.  Will give patient Lovenox .  Orthopedics to decide when to hold Lovenox .  Sleep apnea on CPAP.    DVT prophylaxis: Lovenox    Code Status: Full code Family Communication: None at the bedside Disposition Plan: Status is: Inpatient Remains inpatient appropriate because: IV antibiotics     Consultants:  None  Procedures:  None  Antimicrobials:  Rocephin  8/29-- vancomycin  8/29---9/1     Objective: Vitals:   08/13/24 0455 08/13/24 1428 08/13/24 2016 08/14/24 0623  BP: 117/61 (!) 123/58 (!) 141/58 131/76  Pulse: 82 80 80 85  Resp: 16 16 18 16   Temp: 98.2 F (36.8 C) 98.1 F (36.7 C) 97.7 F (36.5 C) 98 F (36.7 C)  TempSrc: Oral Oral Oral Oral  SpO2: (!) 89% 93% 94% 94%  Weight:      Height:        Intake/Output Summary (Last 24 hours) at 08/14/2024 1241 Last data filed at 08/14/2024 1223 Gross per 24 hour  Intake 1460 ml  Output 1150 ml  Net 310 ml   Filed Weights   08/11/24 1330  Weight: 102.1 kg    Examination:  General exam: Looks fairly comfortable. Talkative.  Respiratory system: Clear to auscultation. Respiratory effort normal.  Cardiovascular system: S1 & S2 heard, RRR. No JVD, murmurs, rubs, gallops or clicks. No pedal edema. Gastrointestinal system: Abdomen is nondistended, soft and nontender. No organomegaly or masses felt. Normal bowel sounds heard. Central nervous system:  Alert and oriented. No focal neurological deficits. Extremities: Symmetric 5 x 5 power. Deformity with muscle wasting on the left knee.    Data Reviewed: I have personally reviewed following labs and imaging studies  CBC: Recent Labs  Lab 08/07/24 1303 08/11/24 1441 08/12/24 0612 08/14/24 0445  WBC 7.7 14.2* 8.1 7.6  NEUTROABS 4.4 11.4*  --   --   HGB  14.2 15.4* 12.6 12.5  HCT 44.8 50.6* 42.2 42.4  MCV 97.0 99.2 105.0* 101.7*  PLT 168 193 137* 142*   Basic Metabolic Panel: Recent Labs  Lab 08/07/24 1303 08/11/24 1441 08/12/24 0612 08/14/24 0445  NA 142 140 143  --   K 3.8 4.1 4.9  --   CL 103 101 105  --   CO2 34* 25 29  --   GLUCOSE 95 103* 139*  --   BUN 14 12 12   --   CREATININE 0.64 0.93 0.91 0.64  CALCIUM 9.2 9.5 9.0  --    GFR: Estimated Creatinine Clearance: 80.8 mL/min (by C-G formula based on SCr of 0.64 mg/dL). Liver Function Tests: Recent Labs  Lab 08/11/24 1441  AST 38  ALT 46*  ALKPHOS 122  BILITOT 0.4  PROT 7.7  ALBUMIN 4.1   Recent Labs  Lab 08/11/24 1441  LIPASE 10*   No results for input(s): AMMONIA in the last 168 hours. Coagulation Profile: Recent Labs  Lab 08/11/24 1441  INR 1.6*   Cardiac Enzymes: No results for input(s): CKTOTAL, CKMB, CKMBINDEX, TROPONINI in the last 168 hours. BNP (last 3 results) No results for input(s): PROBNP in the last 8760 hours. HbA1C: No results for input(s): HGBA1C in the last 72 hours. CBG: No results for input(s): GLUCAP in the last 168 hours. Lipid Profile: No results for input(s): CHOL, HDL, LDLCALC, TRIG, CHOLHDL, LDLDIRECT in the last 72 hours. Thyroid  Function Tests: No results for input(s): TSH, T4TOTAL, FREET4, T3FREE, THYROIDAB in the last 72 hours. Anemia Panel: No results for input(s): VITAMINB12, FOLATE, FERRITIN, TIBC, IRON , RETICCTPCT in the last 72 hours. Sepsis Labs: Recent Labs  Lab 08/11/24 1511 08/11/24 2302  PROCALCITON  --  0.11  LATICACIDVEN 1.2  --     Recent Results (from the past 240 hours)  Surgical pcr screen     Status: None   Collection Time: 08/11/24  2:26 PM   Specimen: Nasal Mucosa; Nasal Swab  Result Value Ref Range Status   MRSA, PCR NEGATIVE NEGATIVE Final   Staphylococcus aureus NEGATIVE NEGATIVE Final    Comment: (NOTE) The Xpert SA Assay (FDA approved  for NASAL specimens in patients 14 years of age and older), is one component of a comprehensive surveillance program. It is not intended to diagnose infection nor to guide or monitor treatment. Performed at [facility] [address], [place], [place] [address]   Culture, blood (Routine x 2)     Status: Abnormal   Collection Time: 08/11/24  3:05 PM   Specimen: BLOOD  Result Value Ref Range Status   Specimen Description   Final    BLOOD LEFT ANTECUBITAL Performed at [facility], [address] [place], [place] [address]    Special Requests   Final    BOTTLES DRAWN AEROBIC AND ANAEROBIC Blood Culture adequate volume Performed at [facility], [address], [place], [place] [address]    Culture  Setup Time   Final    GRAM POSITIVE COCCI IN CLUSTERS IN BOTH AEROBIC AND ANAEROBIC BOTTLES CRITICAL  RESULT CALLED TO, READ BACK BY AND VERIFIED WITH: PHARMD MELISSA JAMES [id number] AT 1530 BY EC    Culture (A)  Final    STAPHYLOCOCCUS HOMINIS THE SIGNIFICANCE OF ISOLATING THIS ORGANISM FROM A SINGLE SET OF BLOOD CULTURES WHEN MULTIPLE SETS ARE DRAWN IS UNCERTAIN. PLEASE NOTIFY THE MICROBIOLOGY DEPARTMENT WITHIN ONE WEEK IF SPECIATION AND SENSITIVITIES ARE REQUIRED. Performed at [facility], [address], [place], [place] [address]    Report Status 08/14/2024 FINAL  Final  Blood Culture ID Panel (Reflexed)     Status: Abnormal   Collection Time: 08/11/24  3:05 PM  Result Value Ref Range Status   Enterococcus faecalis NOT DETECTED NOT DETECTED Final   Enterococcus Faecium NOT DETECTED NOT DETECTED Final   Listeria monocytogenes NOT DETECTED NOT DETECTED Final   Staphylococcus species DETECTED (A) NOT DETECTED Final    Comment: RBV PHARMD MELISSA JAMES [id number] AT 1530 BY EC    Staphylococcus aureus (BCID) NOT DETECTED NOT DETECTED Final   Staphylococcus epidermidis NOT DETECTED NOT DETECTED Final   Staphylococcus  lugdunensis NOT DETECTED NOT DETECTED Final   Streptococcus species NOT DETECTED NOT DETECTED Final   Streptococcus agalactiae NOT DETECTED NOT DETECTED Final   Streptococcus pneumoniae NOT DETECTED NOT DETECTED Final   Streptococcus pyogenes NOT DETECTED NOT DETECTED Final   A.calcoaceticus-baumannii NOT DETECTED NOT DETECTED Final   Bacteroides fragilis NOT DETECTED NOT DETECTED Final   Enterobacterales NOT DETECTED NOT DETECTED Final   Enterobacter cloacae complex NOT DETECTED NOT DETECTED Final   Escherichia coli NOT DETECTED NOT DETECTED Final   Klebsiella aerogenes NOT DETECTED NOT DETECTED Final   Klebsiella oxytoca NOT DETECTED NOT DETECTED Final   Klebsiella pneumoniae NOT DETECTED NOT DETECTED Final   Proteus species NOT DETECTED NOT DETECTED Final   Salmonella species NOT DETECTED NOT DETECTED Final   Serratia marcescens NOT DETECTED NOT DETECTED Final   Haemophilus influenzae NOT DETECTED NOT DETECTED Final   Neisseria meningitidis NOT DETECTED NOT DETECTED Final   Pseudomonas aeruginosa NOT DETECTED NOT DETECTED Final   Stenotrophomonas maltophilia NOT DETECTED NOT DETECTED Final   Candida albicans NOT DETECTED NOT DETECTED Final   Candida auris NOT DETECTED NOT DETECTED Final   Candida glabrata NOT DETECTED NOT DETECTED Final   Candida krusei NOT DETECTED NOT DETECTED Final   Candida parapsilosis NOT DETECTED NOT DETECTED Final   Candida tropicalis NOT DETECTED NOT DETECTED Final   Cryptococcus neoformans/gattii NOT DETECTED NOT DETECTED Final    Comment: Performed at [facility], [address], [place], [place] [address]  Resp panel by RT-PCR (RSV, Flu A&B, Covid) Anterior Nasal Swab     Status: None   Collection Time: 08/11/24  3:52 PM   Specimen: Anterior Nasal Swab  Result Value Ref Range Status   SARS Coronavirus 2 by RT PCR NEGATIVE NEGATIVE Final    Comment: (NOTE) SARS-CoV-2 target nucleic acids are NOT DETECTED.  The SARS-CoV-2 RNA is generally  detectable in upper respiratory specimens during the acute phase of infection. The lowest concentration of SARS-CoV-2 viral copies this assay can detect is 138 copies/mL. A negative result does not preclude SARS-Cov-2 infection and should not be used as the sole basis for treatment or other patient management decisions. A negative result may occur with  improper specimen collection/handling, submission of specimen other than nasopharyngeal swab, presence of viral mutation(s) within the areas targeted by this assay, and inadequate number of viral copies(<138 copies/mL). A negative result must be combined with clinical  observations, patient history, and epidemiological information. The expected result is Negative.  Fact Sheet for Patients:  BloggerCourse.com  Fact Sheet for Healthcare Providers:  SeriousBroker.it  This test is no t yet approved or cleared by the [place]  FDA and  has been authorized for detection and/or diagnosis of SARS-CoV-2 by FDA under an Emergency Use Authorization (EUA). This EUA will remain  in effect (meaning this test can be used) for the duration of the COVID-19 declaration under Section 564(b)(1) of the Act, 21 U.S.C.section 360bbb-3(b)(1), unless the authorization is terminated  or revoked sooner.       Influenza A by PCR NEGATIVE NEGATIVE Final   Influenza B by PCR NEGATIVE NEGATIVE Final    Comment: (NOTE) The Xpert Xpress SARS-CoV-2/FLU/RSV plus assay is intended as an aid in the diagnosis of influenza from Nasopharyngeal swab specimens and should not be used as a sole basis for treatment. Nasal washings and aspirates are unacceptable for Xpert Xpress SARS-CoV-2/FLU/RSV testing.  Fact Sheet for Patients: BloggerCourse.com  Fact Sheet for Healthcare Providers: SeriousBroker.it  This test is not yet approved or cleared by the [place]  FDA  and has been authorized for detection and/or diagnosis of SARS-CoV-2 by FDA under an Emergency Use Authorization (EUA). This EUA will remain in effect (meaning this test can be used) for the duration of the COVID-19 declaration under Section 564(b)(1) of the Act, 21 U.S.C. section 360bbb-3(b)(1), unless the authorization is terminated or revoked.     Resp Syncytial Virus by PCR NEGATIVE NEGATIVE Final    Comment: (NOTE) Fact Sheet for Patients: BloggerCourse.com  Fact Sheet for Healthcare Providers: SeriousBroker.it  This test is not yet approved or cleared by the [place]  FDA and has been authorized for detection and/or diagnosis of SARS-CoV-2 by FDA under an Emergency Use Authorization (EUA). This EUA will remain in effect (meaning this test can be used) for the duration of the COVID-19 declaration under Section 564(b)(1) of the Act, 21 U.S.C. section 360bbb-3(b)(1), unless the authorization is terminated or revoked.  Performed at [facility], [address] [place], [place] [address]   Culture, blood (Routine x 2)     Status: None (Preliminary result)   Collection Time: 08/11/24 11:02 PM   Specimen: BLOOD LEFT ARM  Result Value Ref Range Status   Specimen Description   Final    BLOOD LEFT ARM Performed at [facility], [address] [place], [place] [address]    Special Requests   Final    BOTTLES DRAWN AEROBIC ONLY Blood Culture results may not be optimal due to an inadequate volume of blood received in culture bottles Performed at [facility], [address] [place], [place] [address]    Culture   Final    NO GROWTH 2 DAYS Performed at [facility], [address] [place], [place] [address]    Report Status PENDING  Incomplete         Radiology Studies: No results found.       Scheduled Meds:  ascorbic acid   1,000 mg Oral Daily   B-complex  with vitamin C   1 tablet Oral Daily   Chlorhexidine  Gluconate Cloth  6 each Topical Daily   DULoxetine   60 mg Oral QHS   enoxaparin  (LOVENOX ) injection  100 mg Subcutaneous Q12H   famotidine   20 mg Oral QPC supper   gabapentin   400 mg Oral BID   pantoprazole   40 mg Oral Q breakfast   polyethylene glycol  17 g Oral Daily   senna  1 tablet Oral Daily   Continuous Infusions:  cefTRIAXone  (ROCEPHIN )  IV 1 g (08/14/24 0848)     LOS: 3 days    Time spent: 45 minutes     Renato Applebaum, MD [facility]

## 2024-08-14 NOTE — Progress Notes (Signed)
 Physical Therapy Treatment Patient Details Name: Norma Anthony MRN: [id number] DOB: November 25, 1956 Today's Date: 08/14/2024   History of Present Illness Pt is 68 yo female who presented from preop with fever, abdominal pain, and dysuria on 08/12/24.  She is scheduled for R TKA on 08/17/24. Pt admitted with sepsis with multifocal PNE.  Pt with hx including but not limited to COPD on intermittent 2 L oxygen, PE/DVT of RLE s/p IVC filter on Xarelto , obesity, OSA, GERD, fibromyalgia, arthritis who had left TKA in the past followed by MSSA infection requiring multiple restaged surgeries    PT Comments  Pt ambulated 22' with RW, distance limited by fatigue, HR 110. Pt performed seated BLE strengthening exercises. She's hoping to have R TKA as scheduled on 08/17/24.    If plan is discharge home, recommend the following: A little help with walking and/or transfers;A little help with bathing/dressing/bathroom;Assistance with cooking/housework;Help with stairs or ramp for entrance   Can travel by private vehicle        Equipment Recommendations  None recommended by PT    Recommendations for Other Services       Precautions / Restrictions Precautions Precautions: Fall Recall of Precautions/Restrictions: Intact Restrictions Weight Bearing Restrictions Per Provider Order: No     Mobility  Bed Mobility               General bed mobility comments: up in recliner    Transfers Overall transfer level: Needs assistance Equipment used: Rolling walker (2 wheels) Transfers: Sit to/from Stand Sit to Stand: Min assist           General transfer comment: min A for mild posterior lean, VCs hand placement    Ambulation/Gait Ambulation/Gait assistance: Contact guard assist Gait Distance (Feet): 22 Feet Assistive device: Rolling walker (2 wheels) Gait Pattern/deviations: Step-to pattern, Decreased stride length, Shuffle, Antalgic Gait velocity: decreased     General Gait Details: Mild instability  and fatiguing easily requiring CGA and chair follow, HR 110   Stairs             Wheelchair Mobility     Tilt Bed    Modified Rankin (Stroke Patients Only)       Balance Overall balance assessment: Needs assistance Sitting-balance support: No upper extremity supported Sitting balance-Leahy Scale: Good     Standing balance support: Bilateral upper extremity supported, Reliant on assistive device for balance Standing balance-Leahy Scale: Poor                              Communication Communication Communication: No apparent difficulties  Cognition Arousal: Alert Behavior During Therapy: WFL for tasks assessed/performed   PT - Cognitive impairments: No apparent impairments                         Following commands: Intact      Cueing Cueing Techniques: Verbal cues  Exercises General Exercises - Lower Extremity Ankle Circles/Pumps: AROM, Both, 10 reps, Supine Quad Sets: AROM, Both, 10 reps, Supine Long Arc Quad: AROM, Both, 10 reps, Seated Hip Flexion/Marching: AROM, Both, 15 reps, Seated    General Comments        Pertinent Vitals/Pain Pain Assessment Pain Score: 4  Pain Location: headache Pain Descriptors / Indicators: Aching Pain Intervention(s): Limited activity within patient's tolerance, Monitored during session, Patient requesting pain meds-RN notified, Repositioned    Home Living  Prior Function            PT Goals (current goals can now be found in the care plan section) Acute Rehab PT Goals Patient Stated Goal: return home, get other knee replaced PT Goal Formulation: With patient/family Time For Goal Achievement: 08/26/24 Potential to Achieve Goals: Good Progress towards PT goals: Progressing toward goals    Frequency    Min 2X/week      PT Plan      Co-evaluation              AM-PAC PT 6 Clicks Mobility   Outcome Measure  Help needed turning from your  back to your side while in a flat bed without using bedrails?: A Little Help needed moving from lying on your back to sitting on the side of a flat bed without using bedrails?: A Little Help needed moving to and from a bed to a chair (including a wheelchair)?: A Little Help needed standing up from a chair using your arms (e.g., wheelchair or bedside chair)?: A Little Help needed to walk in hospital room?: A Little Help needed climbing 3-5 steps with a railing? : A Lot 6 Click Score: 17    End of Session Equipment Utilized During Treatment: Gait belt Activity Tolerance: Patient limited by fatigue Patient left: in chair;with call bell/phone within reach Nurse Communication: Mobility status PT Visit Diagnosis: Other abnormalities of gait and mobility (R26.89);Muscle weakness (generalized) (M62.81)     Time: 8875-8854 PT Time Calculation (min) (ACUTE ONLY): 21 min  Charges:    $Gait Training: 8-22 mins PT General Charges $$ ACUTE PT VISIT: 1 Visit                     Sylvan Delon Copp PT 08/14/2024  Acute Rehabilitation Services  Office [phone]

## 2024-08-14 NOTE — TOC Progression Note (Signed)
 Transition of Care [facility]) - Progression Note   Patient Details  Name: Norma Anthony MRN: [id number] Date of Birth: 09/19/1956  Transition of Care [facility]) CM/SW Contact  Duwaine GORMAN Aran, LCSW Phone Number: 08/14/2024, 9:09 AM  Clinical Narrative: PT evaluation did not recommend any PT follow up at this time.  Barriers to Discharge: Continued Medical Work up  Expected Discharge Plan and Services In-house Referral: NA Discharge Planning Services: NA Living arrangements for the past 2 months: Single Family Home           DME Arranged: N/A DME Agency: NA HH Arranged: NA HH Agency: NA  Social Drivers of Health (SDOH) Interventions SDOH Screenings   Food Insecurity: No Food Insecurity (08/11/2024)  Housing: Low Risk  (08/11/2024)  Transportation Needs: No Transportation Needs (08/11/2024)  Utilities: Not At Risk (08/11/2024)  Depression (PHQ2-9): Low Risk  (08/07/2024)  Financial Resource Strain: Low Risk  (04/23/2021)   Received from [facility]  Social Connections: Moderately Integrated (08/11/2024)  Stress: Stress Concern Present (04/23/2021)   [facility] from [facility]  Tobacco Use: Medium Risk (08/12/2024)   Readmission Risk Interventions    08/12/2024    1:40 PM 06/05/2024   11:00 AM 03/30/2024    8:49 AM  Readmission Risk Prevention Plan  Transportation Screening Complete Complete Complete  Medication Review Oceanographer) Complete Complete Complete  PCP or Specialist appointment within 3-5 days of discharge Complete    HRI or Home Care Consult Complete Complete Complete  SW Recovery Care/Counseling Consult Complete Complete Complete  Palliative Care Screening Not Applicable Not Applicable Not Applicable  Skilled Nursing Facility Not Applicable Not Applicable Not Applicable

## 2024-08-15 DIAGNOSIS — J13 Pneumonia due to Streptococcus pneumoniae: Secondary | ICD-10-CM | POA: Diagnosis not present

## 2024-08-15 MED ORDER — DOXYCYCLINE HYCLATE 100 MG PO TABS: 100.0000 mg | ORAL_TABLET | Freq: Two times a day (BID) | ORAL | 0 refills | Status: AC

## 2024-08-15 NOTE — Discharge Summary (Signed)
 Physician Discharge Summary  [place] FMW:[id number] DOB: 05-18-1956 DOA: 08/11/2024  PCP: Dreama Mcardle, MD  Admit date: 08/11/2024 Discharge date: 08/15/2024  Admitted From: Home Disposition: Home  Recommendations for Outpatient Follow-up:  Follow up with PCP in 1-2 weeks Please obtain BMP/CBC in one week Orthopedics to schedule follow-up  Home Health: N/A Equipment/Devices: N/A  Discharge Condition: Stable CODE STATUS: Full code Diet recommendation: Regular diet  Discharge summary: 68 year old with history of COPD on intermittent 2 L oxygen at home, PE and DVT on Xarelto , sleep apnea, GERD, 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  In the emergency room she had temperature 100.9, heart rate 120, blood pressure stable.  85% on room air requiring 2 L oxygen.  WBC count 14.2.  Respiratory panel negative.  Admitted for possible infection, likely pneumonia.  Treated with broad-spectrum antibiotics.  Clinically stabilized.  Cultures negative.  Able to go home today with repeat scheduled knee surgery.   Sepsis present on admission likely secondary to multifocal pneumonia.  Acute on chronic hypoxemic respiratory failure. Presented with tachycardia, fever and leukocytosis. CT scan of the chest with bilateral groundglass opacities consistent with multifocal pneumonia versus atelectasis.  Patient did not have any pulmonary symptoms.  She has COPD and occasionally uses oxygen at home. Viral panel negative. Treated with Rocephin  and doxycycline  then with vancomycin . Blood cultures with coag negative Staphylococcus.  Likely contaminant. Patient was treated with Rocephin .  Completed 4 days of Rocephin   today. Doxycycline  for additional 5 days to complete 7 days of  therapy for pneumonia.  Patient is already on chronic Keflex  therapy that she will continue.   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  Patient is scheduled for right knee arthroplasty.  Orthopedics aware.  Continue Keflex .   Fibromyalgia and chronic pain syndrome: On multiple medications with Xanax , Cymbalta , gabapentin , oral Dilaudid  and Zanaflex  at home.  Continue.  She tends to demand for IV pain medications while in the hospital and we will avoid this without indication.   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  Was bridged with Lovenox  for anticipation of surgery.  Currently surgery is postponed.  She will go back on Xarelto .   Sleep apnea on CPAP at night.  Medically stabilized to discharge.  Case discussed with orthopedics and she is to be rescheduled for surgery.    Discharge Diagnoses:  Principal Problem:   Pneumonia    Discharge Instructions  Discharge Instructions     Diet general   Complete by: As directed    Increase activity slowly   Complete by: As directed       Allergies as of 08/15/2024       Reactions   Belbuca  [buprenorphine  Hcl] Other (See Comments)    Horrible headache    Pregabalin Other (See Comments)   Paralysis present (finding)- not tolerated The patient stated she could not move her body, but she could think.   Compazine  [prochlorperazine ] Anxiety   Droperidol Anxiety   Ergotamine-caffeine Anxiety, Other (See Comments), Hypertension   Increased systolic arterial pressure (finding)  (caffeine / ergotamine)        Medication List  TAKE these medications    albuterol  108 (90 Base) MCG/ACT inhaler Commonly known as: VENTOLIN  HFA Inhale 2 puffs into the lungs every 6 (six) hours as needed for wheezing or shortness of breath.   ALPRAZolam  1 MG tablet Commonly known as: XANAX  Take 1 tablet (1 mg total) by  mouth every 6 (six) hours as needed for anxiety or sleep.   ascorbic acid  1000 MG tablet Commonly known as: VITAMIN C  Take 1,000 mg by mouth daily.   b complex vitamins capsule Take 1 capsule by mouth daily.   cephALEXin  500 MG capsule Commonly known as: KEFLEX  Take 500 mg by mouth 2 (two) times daily.   clotrimazole -betamethasone  cream Commonly known as: LOTRISONE  Apply 1 Application topically 2 (two) times daily as needed (for irritation under the breasts or abdominal folds).   doxycycline  100 MG tablet Commonly known as: VIBRA -TABS Take 1 tablet (100 mg total) by mouth 2 (two) times daily for 5 days.   DULoxetine  60 MG capsule Commonly known as: CYMBALTA  Take 60 mg by mouth at bedtime.   famotidine  20 MG tablet Commonly known as: PEPCID  TAKE 1 TABLET BY MOUTH AFTER SUPPER   Ferrous Fumarate -Folic Acid  324-1 MG Tabs Take 1 tablet by mouth daily. Hematinic   gabapentin  400 MG capsule Commonly known as: NEURONTIN  Take 400 mg by mouth 2 (two) times daily.   HYDROmorphone  4 MG tablet Commonly known as: DILAUDID  Take 1 tablet 4 times a day by oral route as needed for 30 days.   naloxone  4 MG/0.1ML Liqd nasal spray kit Commonly known as: NARCAN  Place 1 spray into the nose once as needed (accidental over dose).   nutrition supplement (JUVEN) Pack Take 1 packet by mouth daily.   pantoprazole  40 MG tablet Commonly known as: PROTONIX  Take 40 mg by mouth daily with breakfast.   polyethylene glycol 17 g packet Commonly known as: MIRALAX  / GLYCOLAX  Take 17 g by mouth daily.   promethazine  25 MG tablet Commonly known as: PHENERGAN  Take 25 mg by mouth every 6 (six) hours as needed for nausea or vomiting.   senna 8.6 MG tablet Commonly known as: SENOKOT Take 1 tablet by mouth daily.   tiZANidine  2 MG tablet Commonly known as: ZANAFLEX  Take 2 mg by mouth every 12 (twelve) hours as needed for muscle spasms.   Xarelto  20 MG Tabs tablet Generic drug:  rivaroxaban  Take 20 mg by mouth daily with supper.        Allergies  Allergen Reactions   Belbuca  [Buprenorphine  Hcl] Other (See Comments)     Horrible headache    Pregabalin Other (See Comments)    Paralysis present (finding)- not tolerated The patient stated she could not move her body, but she could think.   Compazine  [Prochlorperazine ] Anxiety   Droperidol Anxiety   Ergotamine-Caffeine Anxiety, Other (See Comments) and Hypertension    Increased systolic arterial pressure (finding)  (caffeine / ergotamine)    Consultations: None   Procedures/Studies: US  EKG SITE RITE Result Date: 08/11/2024 If Site Rite image not attached, placement could not be confirmed due to current cardiac rhythm.  CT ABDOMEN PELVIS W CONTRAST Result Date: 08/11/2024 CLINICAL DATA:  Fever positive D-dimer EXAM: CT ANGIOGRAPHY CHEST CT ABDOMEN AND PELVIS WITH CONTRAST TECHNIQUE: Multidetector CT imaging of the chest was performed using the standard protocol during bolus administration of intravenous contrast. Multiplanar CT image reconstructions and MIPs were obtained to evaluate the vascular anatomy. Multidetector CT imaging of the abdomen and pelvis was performed using the standard protocol  during bolus administration of intravenous contrast. RADIATION DOSE REDUCTION: This exam was performed according to the departmental dose-optimization program which includes automated exposure control, adjustment of the mA and/or kV according to patient size and/or use of iterative reconstruction technique. CONTRAST:  OMNIPAQUE  IOHEXOL  350 MG/ML SOLN COMPARISON:  Chest x-ray 08/11/2024, CT 06/02/2024, CT chest 09/04/2023 FINDINGS: CTA CHEST FINDINGS Cardiovascular: Satisfactory opacification of the pulmonary arteries to the segmental level. No evidence of pulmonary embolism. Mild aortic atherosclerosis. Negative for aneurysm. Borderline to mild cardiomegaly. No significant pericardial effusion Mediastinum/Nodes: Patent  trachea. No thyroid  mass. Multiple subcentimeter mediastinal lymph nodes. Moderate to large hiatal hernia. Surgical changes at the diaphragmatic hiatus. Lungs/Pleura: Heterogeneous left greater than right ground-glass airspace disease and patchy foci of consolidation. No pleural effusion or pneumothorax. Musculoskeletal: Sternum appears intact. No acute osseous abnormality. Review of the MIP images confirms the above findings. CT ABDOMEN and PELVIS FINDINGS Hepatobiliary: No focal liver abnormality is seen. No gallstones, gallbladder wall thickening, or biliary dilatation. Pancreas: Unremarkable. No pancreatic ductal dilatation or surrounding inflammatory changes. Spleen: Normal in size without focal abnormality. Adrenals/Urinary Tract: Adrenal glands are normal. Kidneys show no hydronephrosis. Subcentimeter hypodensities too small to further characterize, no imaging follow-up is recommended. The bladder is unremarkable Stomach/Bowel: Stomach is within normal limits. Moderate stool burden. No evidence of bowel wall thickening, distention, or inflammatory changes. Vascular/Lymphatic: Nonaneurysmal aorta. No suspicious lymph nodes. IVC filter with apex below the renal vein confluence. Reproductive: Status post hysterectomy. No adnexal masses. Other: Negative for pelvic effusion or free air. Prior hernia repair. Musculoskeletal: No acute or suspicious osseous abnormality. Degenerative changes Review of the MIP images confirms the above findings. IMPRESSION: 1. Negative for acute pulmonary embolus. 2. Heterogeneous left greater than right ground-glass airspace disease and patchy foci of consolidation, consistent with multifocal pneumonia. 3. No CT evidence for acute intra-abdominal or pelvic abnormality. 4. Moderate to large hiatal hernia. 5. Aortic atherosclerosis. Aortic Atherosclerosis (ICD10-I70.0). Electronically Signed   By: Luke Bun M.D.   On: 08/11/2024 17:16   CT Angio Chest PE W/Cm &/Or Wo Cm Result  Date: 08/11/2024 CLINICAL DATA:  Fever positive D-dimer EXAM: CT ANGIOGRAPHY CHEST CT ABDOMEN AND PELVIS WITH CONTRAST TECHNIQUE: Multidetector CT imaging of the chest was performed using the standard protocol during bolus administration of intravenous contrast. Multiplanar CT image reconstructions and MIPs were obtained to evaluate the vascular anatomy. Multidetector CT imaging of the abdomen and pelvis was performed using the standard protocol during bolus administration of intravenous contrast. RADIATION DOSE REDUCTION: This exam was performed according to the departmental dose-optimization program which includes automated exposure control, adjustment of the mA and/or kV according to patient size and/or use of iterative reconstruction technique. CONTRAST:  OMNIPAQUE  IOHEXOL  350 MG/ML SOLN COMPARISON:  Chest x-ray 08/11/2024, CT 06/02/2024, CT chest 09/04/2023 FINDINGS: CTA CHEST FINDINGS Cardiovascular: Satisfactory opacification of the pulmonary arteries to the segmental level. No evidence of pulmonary embolism. Mild aortic atherosclerosis. Negative for aneurysm. Borderline to mild cardiomegaly. No significant pericardial effusion Mediastinum/Nodes: Patent trachea. No thyroid  mass. Multiple subcentimeter mediastinal lymph nodes. Moderate to large hiatal hernia. Surgical changes at the diaphragmatic hiatus. Lungs/Pleura: Heterogeneous left greater than right ground-glass airspace disease and patchy foci of consolidation. No pleural effusion or pneumothorax. Musculoskeletal: Sternum appears intact. No acute osseous abnormality. Review of the MIP images confirms the above findings. CT ABDOMEN and PELVIS FINDINGS Hepatobiliary: No focal liver abnormality is seen. No gallstones, gallbladder wall thickening, or biliary dilatation. Pancreas: Unremarkable. No pancreatic ductal dilatation or surrounding  inflammatory changes. Spleen: Normal in size without focal abnormality. Adrenals/Urinary Tract: Adrenal glands  are normal. Kidneys show no hydronephrosis. Subcentimeter hypodensities too small to further characterize, no imaging follow-up is recommended. The bladder is unremarkable Stomach/Bowel: Stomach is within normal limits. Moderate stool burden. No evidence of bowel wall thickening, distention, or inflammatory changes. Vascular/Lymphatic: Nonaneurysmal aorta. No suspicious lymph nodes. IVC filter with apex below the renal vein confluence. Reproductive: Status post hysterectomy. No adnexal masses. Other: Negative for pelvic effusion or free air. Prior hernia repair. Musculoskeletal: No acute or suspicious osseous abnormality. Degenerative changes Review of the MIP images confirms the above findings. IMPRESSION: 1. Negative for acute pulmonary embolus. 2. Heterogeneous left greater than right ground-glass airspace disease and patchy foci of consolidation, consistent with multifocal pneumonia. 3. No CT evidence for acute intra-abdominal or pelvic abnormality. 4. Moderate to large hiatal hernia. 5. Aortic atherosclerosis. Aortic Atherosclerosis (ICD10-I70.0). Electronically Signed   By: Luke Bun M.D.   On: 08/11/2024 17:16   CT Head Wo Contrast Result Date: 08/11/2024 CLINICAL DATA:  Headache, new onset (Age >= 51y) EXAM: CT HEAD WITHOUT CONTRAST TECHNIQUE: Contiguous axial images were obtained from the base of the skull through the vertex without intravenous contrast. RADIATION DOSE REDUCTION: This exam was performed according to the departmental dose-optimization program which includes automated exposure control, adjustment of the mA and/or kV according to patient size and/or use of iterative reconstruction technique. COMPARISON:  None Available. FINDINGS: Brain: No evidence of acute infarction, hemorrhage, hydrocephalus, extra-axial collection or mass lesion/mass effect. Vascular: No hyperdense vessel. Skull: No acute fracture. Sinuses/Orbits: No acute orbital findings.  Clear sinuses. Other: No mastoid  effusions. IMPRESSION: No evidence of acute intracranial abnormality. Electronically Signed   By: Gilmore GORMAN Molt M.D.   On: 08/11/2024 17:04   DG Chest Portable 1 View Result Date: 08/11/2024 EXAM: 1 VIEW XRAY OF THE CHEST 08/11/2024 03:25:00 PM COMPARISON: 06/14/2024 CLINICAL HISTORY: Fever; Per notes: Patient report fever, chills and headache while at Pre op check up. Patient report took tylenol  this morning without relief. Patient denies chest pain and SOB. Patient report nausea denies vomiting. FINDINGS: LUNGS AND PLEURA: Low lung volumes accentuate cardiomediastinal silhouette and pulmonary vascularity. Increasing hazy opacities at lung bases, representing possible atelectasis or developing pneumonia. Atypical infection not excluded. No pleural effusion. No pneumothorax. HEART AND MEDIASTINUM: No acute abnormality of the cardiac and mediastinal silhouettes. BONES AND SOFT TISSUES: No acute osseous abnormality. IMPRESSION: 1. Increasing hazy opacities at lung bases, representing possible atelectasis or developing pneumonia. Atypical infection not excluded. Electronically signed by: Lonni Necessary MD 08/11/2024 03:47 PM EDT RP Workstation: [id number]   (Echo, Carotid, EGD, Colonoscopy, ERCP)    Subjective: Patient seen and examined.  Denies any complaints.  Her mostly complaints are long-term issues like knee pain and back pain.  No more headaches.  Comfortable with plan to go home.   Discharge Exam: Vitals:   08/15/24 0508 08/15/24 0815  BP: 130/62   Pulse: 87   Resp: 16   Temp: 97.8 F (36.6 C)   SpO2: 95% 95%   Vitals:   08/14/24 2107 08/15/24 0226 08/15/24 0508 08/15/24 0815  BP: 118/72  130/62   Pulse: 80  87   Resp: 20 12 16    Temp: 97.8 F (36.6 C)  97.8 F (36.6 C)   TempSrc: Oral  Oral   SpO2: 93%  95% 95%  Weight:      Height:        General: Pt is alert, awake, not  in acute distress.  On room air. Cardiovascular: RRR, S1/S2 +, no rubs, no  gallops Respiratory: CTA bilaterally, no wheezing, no rhonchi Abdominal: Soft, NT, ND, bowel sounds + Extremities: no edema, no cyanosis Deformity and previous surgical scars left knee.    The results of significant diagnostics from this hospitalization (including imaging, microbiology, ancillary and laboratory) are listed below for reference.     Microbiology: Recent Results (from the past 240 hours)  Surgical pcr screen     Status: None   Collection Time: 08/11/24  2:26 PM   Specimen: Nasal Mucosa; Nasal Swab  Result Value Ref Range Status   MRSA, PCR NEGATIVE NEGATIVE Final   Staphylococcus aureus NEGATIVE NEGATIVE Final    Comment: (NOTE) The Xpert SA Assay (FDA approved for NASAL specimens in patients 29 years of age and older), is one component of a comprehensive surveillance program. It is not intended to diagnose infection nor to guide or monitor treatment. Performed at [facility], [address], [place], [place] [address]   Culture, blood (Routine x 2)     Status: Abnormal   Collection Time: 08/11/24  3:05 PM   Specimen: BLOOD  Result Value Ref Range Status   Specimen Description   Final    BLOOD LEFT ANTECUBITAL Performed at [facility], [address], [place], [place] [address]    Special Requests   Final    BOTTLES DRAWN AEROBIC AND ANAEROBIC Blood Culture adequate volume Performed at [facility], [address] [place], [place] [address]    Culture  Setup Time   Final    GRAM POSITIVE COCCI IN CLUSTERS IN BOTH AEROBIC AND ANAEROBIC BOTTLES CRITICAL RESULT CALLED TO, READ BACK BY AND VERIFIED WITH: PHARMD MELISSA JAMES [id number] AT 1530 BY EC    Culture (A)  Final    STAPHYLOCOCCUS HOMINIS THE SIGNIFICANCE OF ISOLATING THIS ORGANISM FROM A SINGLE SET OF BLOOD CULTURES WHEN MULTIPLE SETS ARE DRAWN IS UNCERTAIN. PLEASE NOTIFY THE MICROBIOLOGY DEPARTMENT WITHIN ONE WEEK IF SPECIATION AND  SENSITIVITIES ARE REQUIRED. Performed at [facility], [address] [place], [place] [address]    Report Status 08/14/2024 FINAL  Final  Blood Culture ID Panel (Reflexed)     Status: Abnormal   Collection Time: 08/11/24  3:05 PM  Result Value Ref Range Status   Enterococcus faecalis NOT DETECTED NOT DETECTED Final   Enterococcus Faecium NOT DETECTED NOT DETECTED Final   Listeria monocytogenes NOT DETECTED NOT DETECTED Final   Staphylococcus species DETECTED (A) NOT DETECTED Final    Comment: RBV PHARMD MELISSA JAMES [id number] AT 1530 BY EC    Staphylococcus aureus (BCID) NOT DETECTED NOT DETECTED Final   Staphylococcus epidermidis NOT DETECTED NOT DETECTED Final   Staphylococcus lugdunensis NOT DETECTED NOT DETECTED Final   Streptococcus species NOT DETECTED NOT DETECTED Final   Streptococcus agalactiae NOT DETECTED NOT DETECTED Final   Streptococcus pneumoniae NOT DETECTED NOT DETECTED Final   Streptococcus pyogenes NOT DETECTED NOT DETECTED Final   A.calcoaceticus-baumannii NOT DETECTED NOT DETECTED Final   Bacteroides fragilis NOT DETECTED NOT DETECTED Final   Enterobacterales NOT DETECTED NOT DETECTED Final   Enterobacter cloacae complex NOT DETECTED NOT DETECTED Final   Escherichia coli NOT DETECTED NOT DETECTED Final   Klebsiella aerogenes NOT DETECTED NOT DETECTED Final   Klebsiella oxytoca NOT DETECTED NOT DETECTED Final   Klebsiella pneumoniae NOT DETECTED NOT DETECTED Final   Proteus species NOT DETECTED NOT DETECTED Final  Salmonella species NOT DETECTED NOT DETECTED Final   Serratia marcescens NOT DETECTED NOT DETECTED Final   Haemophilus influenzae NOT DETECTED NOT DETECTED Final   Neisseria meningitidis NOT DETECTED NOT DETECTED Final   Pseudomonas aeruginosa NOT DETECTED NOT DETECTED Final   Stenotrophomonas maltophilia NOT DETECTED NOT DETECTED Final   Candida albicans NOT DETECTED NOT DETECTED Final   Candida auris NOT DETECTED NOT DETECTED Final    Candida glabrata NOT DETECTED NOT DETECTED Final   Candida krusei NOT DETECTED NOT DETECTED Final   Candida parapsilosis NOT DETECTED NOT DETECTED Final   Candida tropicalis NOT DETECTED NOT DETECTED Final   Cryptococcus neoformans/gattii NOT DETECTED NOT DETECTED Final    Comment: Performed at [facility], [address] [place], [place] [address]  Resp panel by RT-PCR (RSV, Flu A&B, Covid) Anterior Nasal Swab     Status: None   Collection Time: 08/11/24  3:52 PM   Specimen: Anterior Nasal Swab  Result Value Ref Range Status   SARS Coronavirus 2 by RT PCR NEGATIVE NEGATIVE Final    Comment: (NOTE) SARS-CoV-2 target nucleic acids are NOT DETECTED.  The SARS-CoV-2 RNA is generally detectable in upper respiratory specimens during the acute phase of infection. The lowest concentration of SARS-CoV-2 viral copies this assay can detect is 138 copies/mL. A negative result does not preclude SARS-Cov-2 infection and should not be used as the sole basis for treatment or other patient management decisions. A negative result may occur with  improper specimen collection/handling, submission of specimen other than nasopharyngeal swab, presence of viral mutation(s) within the areas targeted by this assay, and inadequate number of viral copies(<138 copies/mL). A negative result must be combined with clinical observations, patient history, and epidemiological information. The expected result is Negative.  Fact Sheet for Patients:  BloggerCourse.com  Fact Sheet for Healthcare Providers:  SeriousBroker.it  This test is no t yet approved or cleared by the [place]  FDA and  has been authorized for detection and/or diagnosis of SARS-CoV-2 by FDA under an Emergency Use Authorization (EUA). This EUA will remain  in effect (meaning this test can be used) for the duration of the COVID-19 declaration under Section 564(b)(1) of the Act,  21 U.S.C.section 360bbb-3(b)(1), unless the authorization is terminated  or revoked sooner.       Influenza A by PCR NEGATIVE NEGATIVE Final   Influenza B by PCR NEGATIVE NEGATIVE Final    Comment: (NOTE) The Xpert Xpress SARS-CoV-2/FLU/RSV plus assay is intended as an aid in the diagnosis of influenza from Nasopharyngeal swab specimens and should not be used as a sole basis for treatment. Nasal washings and aspirates are unacceptable for Xpert Xpress SARS-CoV-2/FLU/RSV testing.  Fact Sheet for Patients: BloggerCourse.com  Fact Sheet for Healthcare Providers: SeriousBroker.it  This test is not yet approved or cleared by the [place]  FDA and has been authorized for detection and/or diagnosis of SARS-CoV-2 by FDA under an Emergency Use Authorization (EUA). This EUA will remain in effect (meaning this test can be used) for the duration of the COVID-19 declaration under Section 564(b)(1) of the Act, 21 U.S.C. section 360bbb-3(b)(1), unless the authorization is terminated or revoked.     Resp Syncytial Virus by PCR NEGATIVE NEGATIVE Final    Comment: (NOTE) Fact Sheet for Patients: BloggerCourse.com  Fact Sheet for Healthcare Providers: SeriousBroker.it  This test is not yet approved or cleared by the [place]  FDA and has been authorized for detection and/or diagnosis of SARS-CoV-2 by FDA under an Emergency Use  Authorization (EUA). This EUA will remain in effect (meaning this test can be used) for the duration of the COVID-19 declaration under Section 564(b)(1) of the Act, 21 U.S.C. section 360bbb-3(b)(1), unless the authorization is terminated or revoked.  Performed at [facility], [address], [place], [place] [address]   Culture, blood (Routine x 2)     Status: None (Preliminary result)   Collection Time: 08/11/24 11:02 PM   Specimen:  BLOOD LEFT ARM  Result Value Ref Range Status   Specimen Description   Final    BLOOD LEFT ARM Performed at [facility], [address], [place], [place] [address]    Special Requests   Final    BOTTLES DRAWN AEROBIC ONLY Blood Culture results may not be optimal due to an inadequate volume of blood received in culture bottles Performed at [facility], [address] [place], [place] [address]    Culture   Final    NO GROWTH 3 DAYS Performed at [facility], [address] [place], [place] [address]    Report Status PENDING  Incomplete     Labs: BNP (last 3 results) Recent Labs    06/02/24 1555  BNP 43.0   Basic Metabolic Panel: Recent Labs  Lab 08/11/24 1441 08/12/24 0612 08/14/24 0445  NA 140 143  --   K 4.1 4.9  --   CL 101 105  --   CO2 25 29  --   GLUCOSE 103* 139*  --   BUN 12 12  --   CREATININE 0.93 0.91 0.64  CALCIUM 9.5 9.0  --    Liver Function Tests: Recent Labs  Lab 08/11/24 1441  AST 38  ALT 46*  ALKPHOS 122  BILITOT 0.4  PROT 7.7  ALBUMIN 4.1   Recent Labs  Lab 08/11/24 1441  LIPASE 10*   No results for input(s): AMMONIA in the last 168 hours. CBC: Recent Labs  Lab 08/11/24 1441 08/12/24 0612 08/14/24 0445  WBC 14.2* 8.1 7.6  NEUTROABS 11.4*  --   --   HGB 15.4* 12.6 12.5  HCT 50.6* 42.2 42.4  MCV 99.2 105.0* 101.7*  PLT 193 137* 142*   Cardiac Enzymes: No results for input(s): CKTOTAL, CKMB, CKMBINDEX, TROPONINI in the last 168 hours. BNP: Invalid input(s): POCBNP CBG: No results for input(s): GLUCAP in the last 168 hours. D-Dimer No results for input(s): DDIMER in the last 72 hours. Hgb A1c No results for input(s): HGBA1C in the last 72 hours. Lipid Profile No results for input(s): CHOL, HDL, LDLCALC, TRIG, CHOLHDL, LDLDIRECT in the last 72 hours. Thyroid  function studies No results for input(s): TSH, T4TOTAL, T3FREE, THYROIDAB in the last 72  hours.  Invalid input(s): FREET3 Anemia work up No results for input(s): VITAMINB12, FOLATE, FERRITIN, TIBC, IRON , RETICCTPCT in the last 72 hours. Urinalysis    Component Value Date/Time   COLORURINE AMBER (A) 08/11/2024 2001   APPEARANCEUR HAZY (A) 08/11/2024 2001   APPEARANCEUR Clear 01/27/2024 1627   LABSPEC >1.046 (H) 08/11/2024 2001   PHURINE 5.0 08/11/2024 2001   GLUCOSEU NEGATIVE 08/11/2024 2001   HGBUR NEGATIVE 08/11/2024 2001   BILIRUBINUR NEGATIVE 08/11/2024 2001   BILIRUBINUR Negative 01/27/2024 1627   KETONESUR NEGATIVE 08/11/2024 2001   PROTEINUR NEGATIVE 08/11/2024 2001   NITRITE NEGATIVE 08/11/2024 2001   LEUKOCYTESUR NEGATIVE 08/11/2024 2001   Sepsis Labs Recent Labs  Lab 08/11/24 1441 08/12/24 0612 08/14/24 0445  WBC 14.2* 8.1 7.6   Microbiology  Recent Results (from the past 240 hours)  Surgical pcr screen     Status: None   Collection Time: 08/11/24  2:26 PM   Specimen: Nasal Mucosa; Nasal Swab  Result Value Ref Range Status   MRSA, PCR NEGATIVE NEGATIVE Final   Staphylococcus aureus NEGATIVE NEGATIVE Final    Comment: (NOTE) The Xpert SA Assay (FDA approved for NASAL specimens in patients 73 years of age and older), is one component of a comprehensive surveillance program. It is not intended to diagnose infection nor to guide or monitor treatment. Performed at [facility], [address] [place], [place] [address]   Culture, blood (Routine x 2)     Status: Abnormal   Collection Time: 08/11/24  3:05 PM   Specimen: BLOOD  Result Value Ref Range Status   Specimen Description   Final    BLOOD LEFT ANTECUBITAL Performed at [facility], [address] [place], [place] [address]    Special Requests   Final    BOTTLES DRAWN AEROBIC AND ANAEROBIC Blood Culture adequate volume Performed at [facility], [address], [place], [place] [address]    Culture  Setup Time   Final     GRAM POSITIVE COCCI IN CLUSTERS IN BOTH AEROBIC AND ANAEROBIC BOTTLES CRITICAL RESULT CALLED TO, READ BACK BY AND VERIFIED WITH: PHARMD MELISSA JAMES [id number] AT 1530 BY EC    Culture (A)  Final    STAPHYLOCOCCUS HOMINIS THE SIGNIFICANCE OF ISOLATING THIS ORGANISM FROM A SINGLE SET OF BLOOD CULTURES WHEN MULTIPLE SETS ARE DRAWN IS UNCERTAIN. PLEASE NOTIFY THE MICROBIOLOGY DEPARTMENT WITHIN ONE WEEK IF SPECIATION AND SENSITIVITIES ARE REQUIRED. Performed at [facility], [address], [place], [place] [address]    Report Status 08/14/2024 FINAL  Final  Blood Culture ID Panel (Reflexed)     Status: Abnormal   Collection Time: 08/11/24  3:05 PM  Result Value Ref Range Status   Enterococcus faecalis NOT DETECTED NOT DETECTED Final   Enterococcus Faecium NOT DETECTED NOT DETECTED Final   Listeria monocytogenes NOT DETECTED NOT DETECTED Final   Staphylococcus species DETECTED (A) NOT DETECTED Final    Comment: RBV PHARMD MELISSA JAMES [id number] AT 1530 BY EC    Staphylococcus aureus (BCID) NOT DETECTED NOT DETECTED Final   Staphylococcus epidermidis NOT DETECTED NOT DETECTED Final   Staphylococcus lugdunensis NOT DETECTED NOT DETECTED Final   Streptococcus species NOT DETECTED NOT DETECTED Final   Streptococcus agalactiae NOT DETECTED NOT DETECTED Final   Streptococcus pneumoniae NOT DETECTED NOT DETECTED Final   Streptococcus pyogenes NOT DETECTED NOT DETECTED Final   A.calcoaceticus-baumannii NOT DETECTED NOT DETECTED Final   Bacteroides fragilis NOT DETECTED NOT DETECTED Final   Enterobacterales NOT DETECTED NOT DETECTED Final   Enterobacter cloacae complex NOT DETECTED NOT DETECTED Final   Escherichia coli NOT DETECTED NOT DETECTED Final   Klebsiella aerogenes NOT DETECTED NOT DETECTED Final   Klebsiella oxytoca NOT DETECTED NOT DETECTED Final   Klebsiella pneumoniae NOT DETECTED NOT DETECTED Final   Proteus species NOT DETECTED NOT DETECTED Final   Salmonella species NOT  DETECTED NOT DETECTED Final   Serratia marcescens NOT DETECTED NOT DETECTED Final   Haemophilus influenzae NOT DETECTED NOT DETECTED Final   Neisseria meningitidis NOT DETECTED NOT DETECTED Final   Pseudomonas aeruginosa NOT DETECTED NOT DETECTED Final   Stenotrophomonas maltophilia NOT DETECTED NOT DETECTED Final   Candida albicans NOT DETECTED NOT DETECTED Final   Candida auris NOT DETECTED NOT  DETECTED Final   Candida glabrata NOT DETECTED NOT DETECTED Final   Candida krusei NOT DETECTED NOT DETECTED Final   Candida parapsilosis NOT DETECTED NOT DETECTED Final   Candida tropicalis NOT DETECTED NOT DETECTED Final   Cryptococcus neoformans/gattii NOT DETECTED NOT DETECTED Final    Comment: Performed at [facility], [address] [place], [place] [address]  Resp panel by RT-PCR (RSV, Flu A&B, Covid) Anterior Nasal Swab     Status: None   Collection Time: 08/11/24  3:52 PM   Specimen: Anterior Nasal Swab  Result Value Ref Range Status   SARS Coronavirus 2 by RT PCR NEGATIVE NEGATIVE Final    Comment: (NOTE) SARS-CoV-2 target nucleic acids are NOT DETECTED.  The SARS-CoV-2 RNA is generally detectable in upper respiratory specimens during the acute phase of infection. The lowest concentration of SARS-CoV-2 viral copies this assay can detect is 138 copies/mL. A negative result does not preclude SARS-Cov-2 infection and should not be used as the sole basis for treatment or other patient management decisions. A negative result may occur with  improper specimen collection/handling, submission of specimen other than nasopharyngeal swab, presence of viral mutation(s) within the areas targeted by this assay, and inadequate number of viral copies(<138 copies/mL). A negative result must be combined with clinical observations, patient history, and epidemiological information. The expected result is Negative.  Fact Sheet for Patients:  BloggerCourse.com  Fact  Sheet for Healthcare Providers:  SeriousBroker.it  This test is no t yet approved or cleared by the [place]  FDA and  has been authorized for detection and/or diagnosis of SARS-CoV-2 by FDA under an Emergency Use Authorization (EUA). This EUA will remain  in effect (meaning this test can be used) for the duration of the COVID-19 declaration under Section 564(b)(1) of the Act, 21 U.S.C.section 360bbb-3(b)(1), unless the authorization is terminated  or revoked sooner.       Influenza A by PCR NEGATIVE NEGATIVE Final   Influenza B by PCR NEGATIVE NEGATIVE Final    Comment: (NOTE) The Xpert Xpress SARS-CoV-2/FLU/RSV plus assay is intended as an aid in the diagnosis of influenza from Nasopharyngeal swab specimens and should not be used as a sole basis for treatment. Nasal washings and aspirates are unacceptable for Xpert Xpress SARS-CoV-2/FLU/RSV testing.  Fact Sheet for Patients: BloggerCourse.com  Fact Sheet for Healthcare Providers: SeriousBroker.it  This test is not yet approved or cleared by the [place]  FDA and has been authorized for detection and/or diagnosis of SARS-CoV-2 by FDA under an Emergency Use Authorization (EUA). This EUA will remain in effect (meaning this test can be used) for the duration of the COVID-19 declaration under Section 564(b)(1) of the Act, 21 U.S.C. section 360bbb-3(b)(1), unless the authorization is terminated or revoked.     Resp Syncytial Virus by PCR NEGATIVE NEGATIVE Final    Comment: (NOTE) Fact Sheet for Patients: BloggerCourse.com  Fact Sheet for Healthcare Providers: SeriousBroker.it  This test is not yet approved or cleared by the [place]  FDA and has been authorized for detection and/or diagnosis of SARS-CoV-2 by FDA under an Emergency Use Authorization (EUA). This EUA will remain in effect  (meaning this test can be used) for the duration of the COVID-19 declaration under Section 564(b)(1) of the Act, 21 U.S.C. section 360bbb-3(b)(1), unless the authorization is terminated or revoked.  Performed at [facility], [address], [place], [place] [address]   Culture, blood (Routine x 2)     Status: None (Preliminary result)  Collection Time: 08/11/24 11:02 PM   Specimen: BLOOD LEFT ARM  Result Value Ref Range Status   Specimen Description   Final    BLOOD LEFT ARM Performed at [facility], [address], [place], [place] [address]    Special Requests   Final    BOTTLES DRAWN AEROBIC ONLY Blood Culture results may not be optimal due to an inadequate volume of blood received in culture bottles Performed at [facility], [address] [place], [place] [address]    Culture   Final    NO GROWTH 3 DAYS Performed at [facility], [address] [place], [place] [address]    Report Status PENDING  Incomplete     Time coordinating discharge: 40 minutes  SIGNED:   Renato Applebaum, MD  [facility] 08/15/2024, 11:03 AM

## 2024-08-15 NOTE — Progress Notes (Signed)
 PICC removed per order. Manual pressure applied per policy to achieve hemostasis. No complications noted. Occlusive dressing in place, remains clean/dry/intact at this time. Patient and spouse given post-removal instructions, including remaining flat for minimum of . Both verbalize understanding.

## 2024-08-15 NOTE — TOC Transition Note (Signed)
 Transition of Care [facility]) - Discharge Note   Patient Details  Name: Norma Anthony MRN: [id number] Date of Birth: 01-05-1956  Transition of Care [facility] CM/SW Contact:  Bascom Service, RN Phone Number: 08/15/2024, 10:19 AM   Clinical Narrative: d/c home no CM needs.      Final next level of care: Home/Self Care Barriers to Discharge: No Barriers Identified   Patient Goals and CMS Choice Patient states their goals for this hospitalization and ongoing recovery are:: return home   Choice offered to / list presented to : NA      Discharge Placement                       Discharge Plan and Services Additional resources added to the After Visit Summary for   In-house Referral: NA Discharge Planning Services: NA            DME Arranged: N/A DME Agency: NA       HH Arranged: NA HH Agency: NA        Social Drivers of Health (SDOH) Interventions SDOH Screenings   Food Insecurity: No Food Insecurity (08/11/2024)  Housing: Low Risk  (08/11/2024)  Transportation Needs: No Transportation Needs (08/11/2024)  Utilities: Not At Risk (08/11/2024)  Depression (PHQ2-9): Low Risk  (08/07/2024)  Financial Resource Strain: Low Risk  (04/23/2021)   Received from [facility]  Social Connections: Moderately Integrated (08/11/2024)  Stress: Stress Concern Present (04/23/2021)   Received from [facility]  Tobacco Use: Medium Risk (08/12/2024)     Readmission Risk Interventions    08/12/2024    1:40 PM 06/05/2024   11:00 AM 03/30/2024    8:49 AM  Readmission Risk Prevention Plan  Transportation Screening Complete Complete Complete  Medication Review Oceanographer) Complete Complete Complete  PCP or Specialist appointment within 3-5 days of discharge Complete    HRI or Home Care Consult Complete Complete Complete  SW Recovery Care/Counseling Consult Complete Complete Complete  Palliative Care Screening Not Applicable Not Applicable Not Applicable  Skilled Nursing Facility Not  Applicable Not Applicable Not Applicable

## 2024-08-17 ENCOUNTER — Ambulatory Visit (HOSPITAL_COMMUNITY): Admission: RE | Admit: 2024-08-17 | Source: Home / Self Care | Admitting: Orthopedic Surgery

## 2024-08-17 ENCOUNTER — Encounter (HOSPITAL_COMMUNITY): Admission: RE | Payer: Self-pay | Source: Home / Self Care

## 2024-08-17 LAB — CULTURE, BLOOD (ROUTINE X 2): Culture: NO GROWTH

## 2024-08-17 SURGERY — ARTHROPLASTY, KNEE, TOTAL, USING IMAGELESS COMPUTER-ASSISTED NAVIGATION
Anesthesia: Spinal | Site: Knee | Laterality: Right

## 2024-09-16 ENCOUNTER — Emergency Department (HOSPITAL_COMMUNITY)
Admission: EM | Admit: 2024-09-16 | Discharge: 2024-09-16 | Disposition: A | Discharge status: Home / Self Care | Attending: Emergency Medicine | Admitting: Emergency Medicine

## 2024-09-16 ENCOUNTER — Emergency Department (HOSPITAL_COMMUNITY)

## 2024-09-16 ENCOUNTER — Other Ambulatory Visit: Payer: Self-pay

## 2024-09-16 ENCOUNTER — Encounter (HOSPITAL_COMMUNITY): Payer: Self-pay

## 2024-09-16 DIAGNOSIS — W1830XA Fall on same level, unspecified, initial encounter: Secondary | ICD-10-CM | POA: Insufficient documentation

## 2024-09-16 DIAGNOSIS — M25552 Pain in left hip: Secondary | ICD-10-CM | POA: Diagnosis not present

## 2024-09-16 DIAGNOSIS — Z7901 Long term (current) use of anticoagulants: Secondary | ICD-10-CM | POA: Insufficient documentation

## 2024-09-16 DIAGNOSIS — I2510 Atherosclerotic heart disease of native coronary artery without angina pectoris: Secondary | ICD-10-CM | POA: Insufficient documentation

## 2024-09-16 DIAGNOSIS — M7918 Myalgia, other site: Secondary | ICD-10-CM | POA: Diagnosis not present

## 2024-09-16 DIAGNOSIS — Y92002 Bathroom of unspecified non-institutional (private) residence single-family (private) house as the place of occurrence of the external cause: Secondary | ICD-10-CM | POA: Insufficient documentation

## 2024-09-16 DIAGNOSIS — Z9981 Dependence on supplemental oxygen: Secondary | ICD-10-CM | POA: Insufficient documentation

## 2024-09-16 DIAGNOSIS — R531 Weakness: Secondary | ICD-10-CM | POA: Diagnosis not present

## 2024-09-16 DIAGNOSIS — M25551 Pain in right hip: Secondary | ICD-10-CM | POA: Diagnosis present

## 2024-09-16 DIAGNOSIS — Z86711 Personal history of pulmonary embolism: Secondary | ICD-10-CM | POA: Diagnosis not present

## 2024-09-16 DIAGNOSIS — W19XXXA Unspecified fall, initial encounter: Secondary | ICD-10-CM

## 2024-09-16 DIAGNOSIS — J449 Chronic obstructive pulmonary disease, unspecified: Secondary | ICD-10-CM | POA: Insufficient documentation

## 2024-09-16 LAB — CBC WITH DIFFERENTIAL/PLATELET
Abs Immature Granulocytes: 0.04 K/uL (ref 0.00–0.07)
Basophils Absolute: 0.1 K/uL (ref 0.0–0.1)
Basophils Relative: 1 %
Eosinophils Absolute: 0.3 K/uL (ref 0.0–0.5)
Eosinophils Relative: 3 %
HCT: 45.2 % (ref 36.0–46.0)
Hemoglobin: 14.4 g/dL (ref 12.0–15.0)
Immature Granulocytes: 0 %
Lymphocytes Relative: 23 %
Lymphs Abs: 2.1 K/uL (ref 0.7–4.0)
MCH: 32.1 pg (ref 26.0–34.0)
MCHC: 31.9 g/dL (ref 30.0–36.0)
MCV: 100.9 fL — ABNORMAL HIGH (ref 80.0–100.0)
Monocytes Absolute: 0.6 K/uL (ref 0.1–1.0)
Monocytes Relative: 7 %
Neutro Abs: 6 K/uL (ref 1.7–7.7)
Neutrophils Relative %: 66 %
Platelets: 164 K/uL (ref 150–400)
RBC: 4.48 MIL/uL (ref 3.87–5.11)
RDW: 13 % (ref 11.5–15.5)
WBC: 9.1 K/uL (ref 4.0–10.5)
nRBC: 0 % (ref 0.0–0.2)

## 2024-09-16 LAB — COMPREHENSIVE METABOLIC PANEL WITH GFR
ALT: 21 U/L (ref 0–44)
AST: 28 U/L (ref 15–41)
Albumin: 3.6 g/dL (ref 3.5–5.0)
Alkaline Phosphatase: 87 U/L (ref 38–126)
Anion gap: 12 (ref 5–15)
BUN: 10 mg/dL (ref 8–23)
CO2: 28 mmol/L (ref 22–32)
Calcium: 9 mg/dL (ref 8.9–10.3)
Chloride: 102 mmol/L (ref 98–111)
Creatinine, Ser: 0.81 mg/dL (ref 0.44–1.00)
GFR, Estimated: >60 mL/min (ref >60)
Glucose, Bld: 99 mg/dL (ref 70–99)
Potassium: 3.4 mmol/L — ABNORMAL LOW (ref 3.5–5.1)
Sodium: 143 mmol/L (ref 135–145)
Total Bilirubin: 0.3 mg/dL (ref 0.0–1.2)
Total Protein: 6.7 g/dL (ref 6.5–8.1)

## 2024-09-16 LAB — CK: Total CK: 106 U/L (ref 38–234)

## 2024-09-16 MED ORDER — METOCLOPRAMIDE HCL 5 MG/ML IJ SOLN
10.0000 mg | Freq: Once | INTRAMUSCULAR | Status: AC
  Administered 2024-09-16: 10 mg via INTRAVENOUS
  Filled 2024-09-16: qty 2

## 2024-09-16 MED ORDER — DIPHENHYDRAMINE HCL 50 MG/ML IJ SOLN
25.0000 mg | Freq: Once | INTRAMUSCULAR | Status: AC
  Administered 2024-09-16: 25 mg via INTRAVENOUS
  Filled 2024-09-16: qty 1

## 2024-09-16 MED ORDER — ONDANSETRON HCL 4 MG/2ML IJ SOLN
4.0000 mg | Freq: Once | INTRAMUSCULAR | Status: AC
  Administered 2024-09-16: 4 mg via INTRAVENOUS
  Filled 2024-09-16: qty 2

## 2024-09-16 MED ORDER — SODIUM CHLORIDE 0.9 % IV BOLUS
500.0000 mL | Freq: Once | INTRAVENOUS | Status: AC
  Administered 2024-09-16: 500 mL via INTRAVENOUS

## 2024-09-16 MED ORDER — HYDROMORPHONE HCL 1 MG/ML IJ SOLN
1.0000 mg | Freq: Once | INTRAMUSCULAR | Status: AC
  Administered 2024-09-16: 1 mg via INTRAVENOUS
  Filled 2024-09-16: qty 1

## 2024-09-16 NOTE — ED Provider Notes (Signed)
 [place] EMERGENCY DEPARTMENT AT [facility] Provider Note   CSN: [id number] Arrival date & time: 09/16/24  1914     Patient presents with: Fall and Weakness   Norma Anthony is a 68 y.o. female.   Pt is a 68 yo female with pmhx significant for COPD (on chronic 2L), CAD, Depression, fibromyalgia, GERD, hx PE (on Xarelto ), migraines, sleep apnea, chronic pain, and anxiety.  Pt presents to the ED today because she felt weak in the bathroom and slid to the floor.  She was unable to get up.  She is supposed to have surgery on her knee last month, but developed pna which required admission.  So, the knee surgery was cancelled.  That leg is weak which is why she think she could not get up.  Pt has pain in her hips and buttocks.  She did not hit her head or have a loc.       Prior to Admission medications   Medication Sig Start Date End Date Taking? Authorizing Provider  albuterol  (VENTOLIN  HFA) 108 (90 Base) MCG/ACT inhaler Inhale 2 puffs into the lungs every 6 (six) hours as needed for wheezing or shortness of breath. 05/12/21   [provider]  ALPRAZolam  (XANAX ) 1 MG tablet Take 1 tablet (1 mg total) by mouth every 6 (six) hours as needed for anxiety or sleep. 03/16/23   Patti Rosina SAUNDERS, PA-C  ascorbic acid  (VITAMIN C ) 1000 MG tablet Take 1,000 mg by mouth daily.    [provider]  b complex vitamins capsule Take 1 capsule by mouth daily.    [provider]  cephALEXin  (KEFLEX ) 500 MG capsule Take 500 mg by mouth 2 (two) times daily. 05/19/24   [provider]  clotrimazole -betamethasone  (LOTRISONE ) cream Apply 1 Application topically 2 (two) times daily as needed (for irritation under the breasts or abdominal folds).    [provider]  DULoxetine  (CYMBALTA ) 60 MG capsule Take 60 mg by mouth at bedtime. 09/14/18   [provider]  famotidine  (PEPCID ) 20 MG tablet TAKE 1 TABLET BY MOUTH AFTER SUPPER 04/01/23   Sood, Vineet, MD   Ferrous Fumarate -Folic Acid  324-1 MG TABS Take 1 tablet by mouth daily. Hematinic 02/29/20   [provider]  gabapentin  (NEURONTIN ) 400 MG capsule Take 400 mg by mouth 2 (two) times daily. 08/21/22   [provider]  HYDROmorphone  (DILAUDID ) 4 MG tablet Take 1 tablet 4 times a day by oral route as needed for 30 days. 06/22/24   [provider]  naloxone  (NARCAN ) nasal spray 4 mg/0.1 mL Place 1 spray into the nose once as needed (accidental over dose). 10/16/21   [provider]  nutrition supplement, JUVEN, (JUVEN) PACK Take 1 packet by mouth daily.    [provider]  pantoprazole  (PROTONIX ) 40 MG tablet Take 40 mg by mouth daily with breakfast. 06/30/16   [provider]  polyethylene glycol (MIRALAX  / GLYCOLAX ) 17 g packet Take 17 g by mouth daily.    [provider]  promethazine  (PHENERGAN ) 25 MG tablet Take 25 mg by mouth every 6 (six) hours as needed for nausea or vomiting.    [provider]  senna (SENOKOT) 8.6 MG tablet Take 1 tablet by mouth daily.    [provider]  tiZANidine  (ZANAFLEX ) 2 MG tablet Take 2 mg by mouth every 12 (twelve) hours as needed for muscle spasms. 02/29/24   [provider]  XARELTO  20 MG TABS tablet Take 20 mg  by mouth daily with supper.    [provider]    Allergies: Belbuca  [buprenorphine  hcl], Pregabalin, Compazine  [prochlorperazine ], Droperidol, and Ergotamine-caffeine    Review of Systems  Musculoskeletal:        Hip and buttock pain; chronic knee pain  All other systems reviewed and are negative.   Updated Vital Signs BP (!) 119/55   Pulse 98   Temp 98.2 F (36.8 C) (Oral)   Resp 14   Ht 5' 5 (1.651 m)   Wt 102.1 kg   SpO2 93%   BMI 37.44 kg/m   Physical Exam Vitals and nursing note reviewed.  Constitutional:      Appearance: Normal appearance. She is obese.  HENT:     Head: Normocephalic and atraumatic.     Right Ear: External ear normal.      Left Ear: External ear normal.     Nose: Nose normal.     Mouth/Throat:     Mouth: Mucous membranes are moist.     Pharynx: Oropharynx is clear.  Eyes:     Extraocular Movements: Extraocular movements intact.     Conjunctiva/sclera: Conjunctivae normal.     Pupils: Pupils are equal, round, and reactive to light.  Cardiovascular:     Rate and Rhythm: Normal rate and regular rhythm.     Pulses: Normal pulses.     Heart sounds: Normal heart sounds.  Pulmonary:     Effort: Pulmonary effort is normal.     Breath sounds: Normal breath sounds.  Abdominal:     General: Abdomen is flat. Bowel sounds are normal.     Palpations: Abdomen is soft.  Musculoskeletal:        General: Normal range of motion.     Cervical back: Normal range of motion and neck supple.  Skin:    General: Skin is warm.     Capillary Refill: Capillary refill takes less than 2 seconds.  Neurological:     General: No focal deficit present.     Mental Status: She is alert and oriented to person, place, and time.  Psychiatric:        Mood and Affect: Mood normal.        Behavior: Behavior normal.     (all labs ordered are listed, but only abnormal results are displayed) Labs Reviewed  COMPREHENSIVE METABOLIC PANEL WITH GFR - Abnormal; Notable for the following components:      Result Value   Potassium 3.4 (*)    All other components within normal limits  CBC WITH DIFFERENTIAL/PLATELET - Abnormal; Notable for the following components:   MCV 100.9 (*)    All other components within normal limits  CK    EKG: EKG Interpretation Date/Time:  Saturday September 16 2024 20:24:15 EDT Ventricular Rate:  91 PR Interval:  167 QRS Duration:  106 QT Interval:  372 QTC Calculation: 458 R Axis:   -36  Text Interpretation: Sinus rhythm Abnormal R-wave progression, early transition Left ventricular hypertrophy Anterior Q waves, possibly due to LVH No significant change since last tracing Confirmed by Dean Clarity  [phone]) on 09/16/2024 8:34:27 PM  Radiology: [facility] Chest Portable 1 View Result Date: 09/16/2024 CLINICAL DATA:  Status post fall. EXAM: PORTABLE CHEST 1 VIEW COMPARISON:  August 11, 2024 FINDINGS: The heart size and mediastinal contours are within normal limits. Low lung volumes are noted. Mild to moderate severity scarring, atelectasis and/or infiltrate is seen within the retrocardiac region of the left lung base. No pleural effusion or pneumothorax  is identified. Multiple radiopaque surgical clips and surgical coils are seen within the upper abdomen. A chronic fracture of the right humeral head and neck is seen. Multilevel degenerative changes are present throughout the thoracic spine. IMPRESSION: Mild to moderate severity left basilar scarring, atelectasis and/or infiltrate. Electronically Signed   By: Suzen Dials M.D.   On: 09/16/2024 20:36   DG Pelvis Portable Result Date: 09/16/2024 CLINICAL DATA:  Status post fall. EXAM: PORTABLE PELVIS 1-2 VIEWS COMPARISON:  None Available. FINDINGS: There is no evidence of an acute pelvic fracture or diastasis. No pelvic bone lesions are seen. IMPRESSION: No acute osseous abnormality. Electronically Signed   By: Suzen Dials M.D.   On: 09/16/2024 20:32     Procedures   Medications Ordered in the ED  sodium chloride  0.9 % bolus 500 mL (0 mLs Intravenous Stopped 09/16/24 2134)  HYDROmorphone  (DILAUDID ) injection 1 mg (1 mg Intravenous Given 09/16/24 2037)  ondansetron  (ZOFRAN ) injection 4 mg (4 mg Intravenous Given 09/16/24 2035)  metoCLOPramide  (REGLAN ) injection 10 mg (10 mg Intravenous Given 09/16/24 2107)  diphenhydrAMINE  (BENADRYL ) injection 25 mg (25 mg Intravenous Given 09/16/24 2106)                                    Medical Decision Making Amount and/or Complexity of Data Reviewed Labs: ordered. Radiology: ordered.  Risk Prescription drug management.   This patient presents to the ED for concern of fall, this involves an extensive  number of treatment options, and is a complaint that carries with it a high risk of complications and morbidity.  The differential diagnosis includes multiple trauma   Co morbidities that complicate the patient evaluation  COPD (on chronic 2L), CAD, Depression, fibromyalgia, GERD, hx PE (on Xarelto ), migraines, sleep apnea, chronic pain, and anxiety   Additional history obtained:  Additional history obtained from epic chart review External records from outside source obtained and reviewed including EMS report/family   Lab Tests:  I Ordered, and personally interpreted labs.  The pertinent results include:  cbc nl, ck nl, cmp nl   Imaging Studies ordered:  I ordered imaging studies including cxr, pelvis  I independently visualized and interpreted imaging which showed  CXR: Mild to moderate severity left basilar scarring, atelectasis and/or  infiltrate.  Pelvis: No acute osseous abnormality.  I agree with the radiologist interpretation   Cardiac Monitoring:  The patient was maintained on a cardiac monitor.  I personally viewed and interpreted the cardiac monitored which showed an underlying rhythm of: nsr   Medicines ordered and prescription drug management:  I ordered medication including dilaudid /zofran /reglan /benadryl   for sx  Reevaluation of the patient after these medicines showed that the patient improved I have reviewed the patients home medicines and have made adjustments as needed   Test Considered:  ct   Problem List / ED Course:  Fall:  likely from knee issues.  She feels much better now.  She is stable for d/c.  Return if worse.  F/u with pcp.   Reevaluation:  After the interventions noted above, I reevaluated the patient and found that they have :improved   Social Determinants of Health:  Lives at home   Dispostion:  After consideration of the diagnostic results and the patients response to treatment, I feel that the patent would benefit from  discharge with outpatient f/u.       Final diagnoses:  Weakness  Fall, initial encounter  ED Discharge Orders     None          Dean Clarity, MD 09/16/24 2231

## 2024-09-16 NOTE — ED Triage Notes (Signed)
 Pt arrived via [facility] after falling in restroom and was unable to get up. Pt complains of bilateral hip and buttock pain. Weakness has been ongoing, but today was worse. No LOC or head strike.

## 2024-10-06 NOTE — Progress Notes (Signed)
 Surgery orders requested via Epic inbox.

## 2024-10-09 ENCOUNTER — Inpatient Hospital Stay

## 2024-10-09 ENCOUNTER — Ambulatory Visit: Payer: Self-pay | Admitting: Student

## 2024-10-09 VITALS — BP 128/80 | HR 95 | Temp 97.5°F | Resp 18

## 2024-10-09 DIAGNOSIS — Z86718 Personal history of other venous thrombosis and embolism: Secondary | ICD-10-CM | POA: Insufficient documentation

## 2024-10-09 DIAGNOSIS — Z7901 Long term (current) use of anticoagulants: Secondary | ICD-10-CM | POA: Insufficient documentation

## 2024-10-09 DIAGNOSIS — Z86711 Personal history of pulmonary embolism: Secondary | ICD-10-CM | POA: Diagnosis present

## 2024-10-09 MED ORDER — VANCOMYCIN HCL 10 G IV SOLR
1000.0000 mg | Freq: Once | INTRAVENOUS | Status: AC
  Administered 2024-10-12: 1000 mg via INTRAVENOUS

## 2024-10-09 NOTE — Progress Notes (Signed)
 [facility] OFFICE PROGRESS NOTE  Patient Care Team: Dreama Mcardle, MD as PCP - General (Internal Medicine)  68 y.o.female with history of COPD, chronic respiratory failure on 2 L oxygen, PE, and DVT of right lower extremity status post IVC filter on anticoagulation, sleep apnea, GERD, fibromyalgia referred to [facility] for history of history of DVT.   First episode: In the 1990s with OCP Second episode: 2000, unprovoked Third episode: 2025 holding anticoagulation for hematoma and with diverticulitis, underlying IVCF. Treatment: Xarelto   Last CTA on 08/11/2024 without any PE.  We spent a lot of time talking about risk factors for venous thrombosis today.  Her questions were answered.  She also has several family members of blood clot. Report her parents both have blood clot, daughter had blood clot as well.  We discussed negative thrombophilia testing does not mean she has no risk of recurrence. She also has other risk factor including personal history, family history, BMI over 30, sedentary in the near future. She also has a IVC filter in place. Discussed this is a risk for DVT as well.   Printed education material for her today.  We also discussed that she should check in with her PCP for kidney function at least twice a year.  If reduced renal clearance, consider changing to Eliquis in the future. Assessment & Plan History of recurrent deep vein thrombosis (DVT) Continue Xarelto  as tolerated. Stop Xarelto  2 to 3 days before surgery per surgeon's recommendation.  Resume postop at hemostasis and at surgeon's discretion. Monitor for recurrent bleeding or hematoma For the ED if traumatic fall, uncontrolled bleeding immediately. Check renal function at least twice a year with PCP.  Last renal function was normal. Changed to Eliquis if reduced renal clearance in the future.  Patient will follow-up as needed.  I spent a total of 46 minutes  including review of chart and various tests results, face-to-face time with the patient, discussions about results, plan of care and providing education with the patient.  [name], MD  INTERVAL HISTORY: Patient returns for follow-up.  Patient was first seen in August 2025.  History below related to her PE and DVT.  She has thrombophilia testing that was unremarkable.  She was admitted for pneumonia.  Report her surgery was postponed.  She is having surgery later this week.  She is on Xarelto  and was recommended to hold for 3 days before surgery.  Her last hemoglobin was 14 in October.  Last kidney function was normal in October.  Hematology history: Patient had hospitalization for acute diverticulitis, mesenteric hematoma in April 2025.  Poor worsening DVT in the setting of holding anticoagulation.  She was placed back on Xarelto .   Report her first PE was long time about about 30 years ago. She was not on anticoagluation.   The 2nd time was during COVID with RLE DVT and was placed on Xarelto .   04/04/2012 CT from [facility] show negative for PE.   08/27/2022 ultrasound right lower extremity showed no evidence of DVT 09/07/2022 ultrasound bilateral lower extremity showed: Chronic DVT involving the right femoral vein, right popliteal vein. no evidence of DVT over left lower extremity   Patient was planned for total knee arthroplasty and plan for IVC filter temporary and removal after 72-month 09/28/2022 IVC filter insertion.   12/17/22 ultrasound bilateral lower extremity showed chronic DVT involving the right popliteal vein.  No DVT on the left.   01/18/23 IVC filter removal  09/04/23 CTA for PE: No evidence of PE.   03/29/24 resenting with diverticulitis CT AP: Stable infrarenal IVC filter.  No DVT reported 04/26/24 CT AP: Nonocclusive DVT developed in the interim in the left common iliac vein extending into the infrahepatic IVC.  Similarly positioned IVC filter, apex just below  the insertion of left renal vein   06/02/24 CT AP: IVC filter with small amount of thrombus distal to the filter.   She will need a surgery for right knee replacement.  This has been scheduled.  She has multiple surgeries on the left knee in the past.  She used to hold anticoagulation 3 days before surgery and resume afterwards.  She denies any issues or complications in the past.   Past Surgical History:  Procedure Laterality Date   ABDOMINAL HYSTERECTOMY     BIOPSY THYROID      EXCISIONAL TOTAL KNEE ARTHROPLASTY WITH ANTIBIOTIC SPACERS Left 03/10/2023   Procedure: Irrigation and debridement left knee, resection total knee arthroplasty, antibiotic spacer placement;  Surgeon: Fidel Rogue, MD;  Location: [facility];  Service: Orthopedics;  Laterality: Left;   HERNIA REPAIR     I & D EXTREMITY Left 12/11/2022   Procedure: IRRIGATION AND DEBRIDEMENT EXTREMITY;  Surgeon: Sharl Selinda Dover, MD;  Location: [facility];  Service: Orthopedics;  Laterality: Left;   IVC FILTER INSERTION N/A 09/28/2022   Procedure: IVC FILTER INSERTION;  Surgeon: Sheree Penne Bruckner, MD;  Location: [facility] INVASIVE CV LAB;  Service: Cardiovascular;  Laterality: N/A;   IVC FILTER INSERTION N/A 08/09/2023   Procedure: IVC FILTER INSERTION;  Surgeon: Sheree Penne Bruckner, MD;  Location: [facility] INVASIVE CV LAB;  Service: Cardiovascular;  Laterality: N/A;   IVC FILTER REMOVAL N/A 01/18/2023   Procedure: IVC FILTER REMOVAL;  Surgeon: Sheree Penne Bruckner, MD;  Location: [facility] INVASIVE CV LAB;  Service: Cardiovascular;  Laterality: N/A;   KIDNEY STONE SURGERY     KNEE ARTHROTOMY Left 02/03/2023   Procedure: KNEE ARTHROTOMY WITH SOFT TISSUE REPAIR;  Surgeon: Fidel Rogue, MD;  Location: [facility];  Service: Orthopedics;  Laterality: Left;  75   LAPAROSCOPIC NISSEN FUNDOPLICATION     PAROTIDECTOMY     REMOVAL OF CEMENTED SPACER KNEE Left 08/25/2023   Procedure: Knee spacer removal;  Surgeon: Fidel Rogue, MD;  Location: [facility];   Service: Orthopedics;  Laterality: Left;  350   TONSILLECTOMY     TOTAL KNEE ARTHROPLASTY Left 11/04/2022   Procedure: TOTAL KNEE ARTHROPLASTY;  Surgeon: Duwayne Purchase, MD;  Location: [facility];  Service: Orthopedics;  Laterality: Left;   TOTAL KNEE REVISION Left 08/25/2023   Procedure: TOTAL KNEE REVISION;  Surgeon: Fidel Rogue, MD;  Location: [facility];  Service: Orthopedics;  Laterality: Left;  350     PHYSICAL EXAMINATION:  Vitals:   10/09/24 1445  BP: 128/80  Pulse: 95  Resp: 18  Temp: (!) 97.5 F (36.4 C)  SpO2: 94%   Filed Weights    GENERAL: alert, no distress and comfortable on wheelchair SKIN: skin color normal and no jaundice  LUNGS: normal breathing effort Musculoskeletal: No tenseness, or tenderness of calves. Bilateral nonpitting edema  Relevant data reviewed during this visit included labs and imaging.

## 2024-10-09 NOTE — H&P (Signed)
 TOTAL KNEE ADMISSION H&P  Patient is being admitted for right total knee arthroplasty.  Subjective:  Chief Complaint:right knee pain.  HPI: Maysun Meditz, 68 y.o. female, has a history of pain and functional disability in the right knee due to arthritis and has failed non-surgical conservative treatments for greater than 12 weeks to includeNSAID's and/or analgesics, corticosteriod injections, viscosupplementation injections, flexibility and strengthening excercises, supervised PT with diminished ADL's post treatment, use of assistive devices, weight reduction as appropriate, and activity modification.  Onset of symptoms was gradual, starting 9 years ago with rapidlly worsening course since that time. The patient noted no past surgery on the right knee(s).  Patient currently rates pain in the right knee(s) at 10 out of 10 with activity. Patient has night pain, worsening of pain with activity and weight bearing, pain that interferes with activities of daily living, pain with passive range of motion, crepitus, and joint swelling.  Patient has evidence of subchondral cysts, subchondral sclerosis, periarticular osteophytes, and joint space narrowing by imaging studies. There is no active infection.  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   Surgical wound dehiscence 03/08/2023   Failed total knee, 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   Hemarthrosis of left knee 12/11/2022   Chronic respiratory failure with hypoxia (HCC)  12/11/2022   Generalized anxiety disorder 12/11/2022   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   DOE (dyspnea on exertion) 04/30/2022   Ex-smoker 04/30/2022   Upper airway cough syndrome 04/30/2022   Lung nodule 03/24/2022   Thyroid  nodule 03/24/2022   Opioid dependence (HCC) 03/23/2022   COPD (chronic obstructive pulmonary disease) (HCC) 03/22/2022   Obstructive sleep apnea syndrome 07/01/2021   History of recurrent deep vein thrombosis (DVT) 07/01/2021   Atherosclerotic heart disease of native coronary artery without angina pectoris 07/01/2021   Anemia 07/01/2021   Anxiety 02/15/2021   History of pulmonary embolism 02/12/2021   Gastroesophageal reflux disease without esophagitis 05/10/2014   Fibromyalgia 05/10/2014   Past Medical History:  Diagnosis Date   Acute respiratory failure with hypoxia (HCC) 03/23/2022   Anemia    Anxiety    Arthritis    CAD (coronary artery disease)    pt deneies   COPD (chronic obstructive pulmonary disease) (HCC)    Depression    DVT (deep venous thrombosis) (HCC)    Dyspnea    Fibromyalgia    Generalized anxiety disorder 12/11/2022   GERD (gastroesophageal reflux disease)    Hiatal hernia    History of kidney stones    Lung nodule 03/24/2022   Migraine headache    Multinodular goiter    Nephrolithiasis    Obstructive sleep apnea syndrome 07/01/2021   Opioid dependence (HCC) 03/23/2022   Pneumonia    Pulmonary embolism (HCC) 1996   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   Sleep apnea    Thyroid  nodule     Past Surgical History:  Procedure Laterality Date   ABDOMINAL HYSTERECTOMY     BIOPSY  THYROID      EXCISIONAL TOTAL KNEE ARTHROPLASTY WITH ANTIBIOTIC SPACERS Left 03/10/2023   Procedure: Irrigation and debridement left knee, resection total knee arthroplasty, antibiotic spacer placement;  Surgeon: Fidel Rogue, MD;  Location: [facility];   Service: Orthopedics;  Laterality: Left;   HERNIA REPAIR     I & D EXTREMITY Left 12/11/2022   Procedure: IRRIGATION AND DEBRIDEMENT EXTREMITY;  Surgeon: Sharl Selinda Dover, MD;  Location: [facility];  Service: Orthopedics;  Laterality: Left;   IVC FILTER INSERTION N/A 09/28/2022   Procedure: IVC FILTER INSERTION;  Surgeon: Sheree Penne Bruckner, MD;  Location: [facility] INVASIVE CV LAB;  Service: Cardiovascular;  Laterality: N/A;   IVC FILTER INSERTION N/A 08/09/2023   Procedure: IVC FILTER INSERTION;  Surgeon: Sheree Penne Bruckner, MD;  Location: [facility] INVASIVE CV LAB;  Service: Cardiovascular;  Laterality: N/A;   IVC FILTER REMOVAL N/A 01/18/2023   Procedure: IVC FILTER REMOVAL;  Surgeon: Sheree Penne Bruckner, MD;  Location: [facility] INVASIVE CV LAB;  Service: Cardiovascular;  Laterality: N/A;   KIDNEY STONE SURGERY     KNEE ARTHROTOMY Left 02/03/2023   Procedure: KNEE ARTHROTOMY WITH SOFT TISSUE REPAIR;  Surgeon: Fidel Rogue, MD;  Location: [facility];  Service: Orthopedics;  Laterality: Left;  75   LAPAROSCOPIC NISSEN FUNDOPLICATION     PAROTIDECTOMY     REMOVAL OF CEMENTED SPACER KNEE Left 08/25/2023   Procedure: Knee spacer removal;  Surgeon: Fidel Rogue, MD;  Location: [facility];  Service: Orthopedics;  Laterality: Left;  350   TONSILLECTOMY     TOTAL KNEE ARTHROPLASTY Left 11/04/2022   Procedure: TOTAL KNEE ARTHROPLASTY;  Surgeon: Duwayne Purchase, MD;  Location: [facility];  Service: Orthopedics;  Laterality: Left;   TOTAL KNEE REVISION Left 08/25/2023   Procedure: TOTAL KNEE REVISION;  Surgeon: Fidel Rogue, MD;  Location: [facility];  Service: Orthopedics;  Laterality: Left;  350    Current Outpatient Medications  Medication Sig Dispense Refill Last Dose/Taking   albuterol  (PROVENTIL ) (2.5 MG/3ML) 0.083% nebulizer solution Take 2.5 mg by nebulization every 4 (four) hours as needed for wheezing or shortness of breath.      albuterol  (VENTOLIN  HFA) 108 (90 Base) MCG/ACT inhaler Inhale 2 puffs  into the lungs every 6 (six) hours as needed for wheezing or shortness of breath.      ALPRAZolam  (XANAX ) 1 MG tablet Take 1 tablet (1 mg total) by mouth every 6 (six) hours as needed for anxiety or sleep. (Patient taking differently: Take 1-2 mg by mouth every 6 (six) hours as needed for anxiety or sleep.) 30 tablet 0    ascorbic acid  (VITAMIN C ) 500 MG tablet Take 500 mg by mouth daily.      b complex vitamins capsule Take 1 capsule by mouth daily.      cephALEXin  (KEFLEX ) 500 MG capsule Take 500 mg by mouth 2 (two) times daily.      Cholecalciferol (VITAMIN D3) 125 MCG (5000 UT) CAPS Take 5,000 Units by mouth daily.      clotrimazole -betamethasone  (LOTRISONE ) cream Apply 1 Application topically 2 (two) times daily as needed (for irritation under the breasts or abdominal folds).      DULoxetine  (CYMBALTA ) 60 MG capsule Take 60 mg by mouth at bedtime.      famotidine  (PEPCID ) 20 MG tablet TAKE 1 TABLET BY MOUTH AFTER SUPPER 30 tablet 0    gabapentin  (NEURONTIN ) 400 MG capsule Take 400 mg by mouth 3 (three) times daily.      HYDROmorphone  (DILAUDID ) 4 MG tablet  Take 4 mg by mouth 4 (four) times daily as needed for severe pain (pain score 7-10).      naloxone  (NARCAN ) nasal spray 4 mg/0.1 mL Place 1 spray into the nose once as needed (accidental over dose).      nutrition supplement, JUVEN, (JUVEN) PACK Take 1 packet by mouth daily.      pantoprazole  (PROTONIX ) 40 MG tablet Take 40 mg by mouth every morning.      polyethylene glycol (MIRALAX  / GLYCOLAX ) 17 g packet Take 17 g by mouth daily.      promethazine  (PHENERGAN ) 25 MG tablet Take 25 mg by mouth every 6 (six) hours as needed for nausea or vomiting.      senna (SENOKOT) 8.6 MG tablet Take 1 tablet by mouth at bedtime.      tiZANidine  (ZANAFLEX ) 2 MG tablet Take 2 mg by mouth every 12 (twelve) hours as needed for muscle spasms.      XARELTO  20 MG TABS tablet Take 20 mg by mouth daily with supper.      Current Facility-Administered Medications   Medication Dose Route Frequency Provider Last Rate Last Admin   vancomycin  (VANCOCIN ) 1,000 mg in sodium chloride  0.9 % 500 mL IVPB  1,000 mg Intravenous Once Marqual Mi S, PA-C       Allergies  Allergen Reactions   Belbuca  [Buprenorphine  Hcl] Other (See Comments)     Horrible headache    Lyrica [Pregabalin] Other (See Comments)    Paralysis present (finding)- not tolerated The patient stated she could not move her body, but she could think.   Compazine  [Prochlorperazine ] Anxiety   Droperidol Anxiety   Ergotamine-Caffeine Anxiety, Other (See Comments) and Hypertension    Increased systolic arterial pressure (finding)  (caffeine / ergotamine)    Social History   Tobacco Use   Smoking status: Former    Current packs/day: 0.00    Average packs/day: 0.5 packs/day for 10.0 years (5.0 ttl pk-yrs)    Types: Cigarettes    Start date: 02/13/2011    Quit date: 02/12/2021    Years since quitting: 3.6   Smokeless tobacco: Never  Substance Use Topics   Alcohol  use: Not Currently    Family History  Problem Relation Age of Onset   Hypertension Mother      Review of Systems  Musculoskeletal:  Positive for arthralgias, gait problem and joint swelling.  All other systems reviewed and are negative.   Objective:  Physical Exam Constitutional:      Appearance: She is obese.  HENT:     Head: Normocephalic and atraumatic.     Nose: Nose normal.     Mouth/Throat:     Mouth: Mucous membranes are moist.     Pharynx: Oropharynx is clear.  Eyes:     Conjunctiva/sclera: Conjunctivae normal.  Cardiovascular:     Rate and Rhythm: Normal rate and regular rhythm.     Pulses: Normal pulses.     Heart sounds: Normal heart sounds.  Pulmonary:     Effort: Pulmonary effort is normal.     Breath sounds: Normal breath sounds.  Abdominal:     General: Abdomen is flat.     Palpations: Abdomen is soft.  Genitourinary:    Comments: Deferred.  Musculoskeletal:     Cervical back: Normal range of  motion.     Comments: Examination of the right knee reveals no skin wounds or lesions. She has swelling, trace effusion. No warmth or erythema. Mild valgus deformity. Tenderness to palpation medial  joint line, lateral joint line, peripatellar retinacular tissues with a positive grind sign. Range of motion is limited. No instability. No extensor lag.  Sensory and motor function intact in LE bilaterally. Distal pedal pulses 2+ bilaterally.   Mild symmetric pedal edema. Calves soft and non-tender.   Skin:    General: Skin is warm and dry.     Capillary Refill: Capillary refill takes less than 2 seconds.  Neurological:     General: No focal deficit present.     Mental Status: She is alert and oriented to person, place, and time.  Psychiatric:        Mood and Affect: Mood normal.        Behavior: Behavior normal.        Thought Content: Thought content normal.        Judgment: Judgment normal.     Vital signs in last 24 hours: @VSRANGES @  Labs:   Estimated body mass index is 37.44 kg/m as calculated from the following:   Height as of 09/16/24: 5' 5 (1.651 m).   Weight as of 09/16/24: 102.1 kg.   Imaging Review Plain radiographs demonstrate severe degenerative joint disease of the right knee(s). The overall alignment issignificant valgus. The bone quality appears to be adequate for age and reported activity level.      Assessment/Plan:  End stage arthritis, right knee   The patient history, physical examination, clinical judgment of the provider and imaging studies are consistent with end stage degenerative joint disease of the right knee(s) and total knee arthroplasty is deemed medically necessary. The treatment options including medical management, injection therapy arthroscopy and arthroplasty were discussed at length. The risks and benefits of total knee arthroplasty were presented and reviewed. The risks due to aseptic loosening, infection, stiffness, patella tracking  problems, thromboembolic complications and other imponderables were discussed. The patient acknowledged the explanation, agreed to proceed with the plan and consent was signed. Patient is being admitted for inpatient treatment for surgery, pain control, PT, OT, prophylactic antibiotics, VTE prophylaxis, progressive ambulation and ADL's and discharge planning. The patient is planning to be discharged home with OPPT after an overnight stay.   Therapy Plans: outpatient therapy. PT [facility] 10/16/24.  Disposition: Home with [place] Husband Planned DVT Prophylaxis: Xarelto  10mg  daily DME needed: Has rolling walker, cane, wheelchair. Hold on ice machine.  PCP: Cleared hold xarelto  2 days prior to surgery. - New PCP clearance, pneumonia resolved, cleared for surgery.  Vascular: Cleared. Has IVC filter, hold xarelto  3 days prior to surgery.  TXA: IV Allergies:  - Belbuca  - headache - Cafergot - elevated BP - Compazine  - anxiety - Droperidol - anxiety - Lyrica - anxiety - Prochlorperazine  - anxiety.  Anesthesia Concerns: None.  BMI: 37.0 Last HgbA1c: Not diabetic.  Other: - History of PE and DVT, chronic xarelto  and aspirin  use. IVC filter placed 08/09/23. Stop xarelto  3 days prior to surgery.  - COPD - OSA - CAD.  - Diverticulitis in April, but resolved.  - Pneumonia resolved, antibiotics completed.  - Juven drink supplement for wound healing previously.  - Vitamin C  and zinc  PO.  - Patient is very high risk due to history of previous MSSA periprosthetic left knee infection, which is now resolved after numerous procedures including two-stage procedure. She also has multiple VTE episodes. She currently has a filter in place and had a left common iliac DVT earlier last year.  - Chronic cephalexin  500mg  BID.  - Recommend weaning/discontinuing narcotic medications before surgery in  order to improve postop pain control and postop satisfaction. Currently on dilaudid  4mg  q 6 hours per Dr. Bonner  pain management. - Kurtis*.  - Dilaudid  2-4mg  q4, zofran , methocarbamol .  - Labs pending.  [facility].     Patient's anticipated LOS is less than 2 midnights, meeting these requirements: - Younger than 57 - Lives within 1 hour of care - Has a competent adult at home to recover with post-op recover - NO history of  - Chronic pain requiring opiods  - Diabetes  - Coronary Artery Disease  - Heart failure  - Heart attack  - Stroke  - DVT/VTE  - Cardiac arrhythmia  - Respiratory Failure/COPD  - Renal failure  - Anemia  - Advanced Liver disease

## 2024-10-09 NOTE — H&P (View-Only) (Signed)
 TOTAL KNEE ADMISSION H&P  Patient is being admitted for right total knee arthroplasty.  Subjective:  Chief Complaint:right knee pain.  HPI: Norma Anthony, 68 y.o. female, has a history of pain and functional disability in the right knee due to arthritis and has failed non-surgical conservative treatments for greater than 12 weeks to includeNSAID's and/or analgesics, corticosteriod injections, viscosupplementation injections, flexibility and strengthening excercises, supervised PT with diminished ADL's post treatment, use of assistive devices, weight reduction as appropriate, and activity modification.  Onset of symptoms was gradual, starting 9 years ago with rapidlly worsening course since that time. The patient noted no past surgery on the right knee(s).  Patient currently rates pain in the right knee(s) at 10 out of 10 with activity. Patient has night pain, worsening of pain with activity and weight bearing, pain that interferes with activities of daily living, pain with passive range of motion, crepitus, and joint swelling.  Patient has evidence of subchondral cysts, subchondral sclerosis, periarticular osteophytes, and joint space narrowing by imaging studies. There is no active infection.  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   Surgical wound dehiscence 03/08/2023   Failed total knee, 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   Hemarthrosis of left knee 12/11/2022   Chronic respiratory failure with hypoxia (HCC)  12/11/2022   Generalized anxiety disorder 12/11/2022   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   DOE (dyspnea on exertion) 04/30/2022   Ex-smoker 04/30/2022   Upper airway cough syndrome 04/30/2022   Lung nodule 03/24/2022   Thyroid  nodule 03/24/2022   Opioid dependence (HCC) 03/23/2022   COPD (chronic obstructive pulmonary disease) (HCC) 03/22/2022   Obstructive sleep apnea syndrome 07/01/2021   History of recurrent deep vein thrombosis (DVT) 07/01/2021   Atherosclerotic heart disease of native coronary artery without angina pectoris 07/01/2021   Anemia 07/01/2021   Anxiety 02/15/2021   History of pulmonary embolism 02/12/2021   Gastroesophageal reflux disease without esophagitis 05/10/2014   Fibromyalgia 05/10/2014   Past Medical History:  Diagnosis Date   Acute respiratory failure with hypoxia (HCC) 03/23/2022   Anemia    Anxiety    Arthritis    CAD (coronary artery disease)    pt deneies   COPD (chronic obstructive pulmonary disease) (HCC)    Depression    DVT (deep venous thrombosis) (HCC)    Dyspnea    Fibromyalgia    Generalized anxiety disorder 12/11/2022   GERD (gastroesophageal reflux disease)    Hiatal hernia    History of kidney stones    Lung nodule 03/24/2022   Migraine headache    Multinodular goiter    Nephrolithiasis    Obstructive sleep apnea syndrome 07/01/2021   Opioid dependence (HCC) 03/23/2022   Pneumonia    Pulmonary embolism (HCC) 1996   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   Sleep apnea    Thyroid  nodule     Past Surgical History:  Procedure Laterality Date   ABDOMINAL HYSTERECTOMY     BIOPSY  THYROID      EXCISIONAL TOTAL KNEE ARTHROPLASTY WITH ANTIBIOTIC SPACERS Left 03/10/2023   Procedure: Irrigation and debridement left knee, resection total knee arthroplasty, antibiotic spacer placement;  Surgeon: Fidel Rogue, MD;  Location: [facility];   Service: Orthopedics;  Laterality: Left;   HERNIA REPAIR     I & D EXTREMITY Left 12/11/2022   Procedure: IRRIGATION AND DEBRIDEMENT EXTREMITY;  Surgeon: Sharl Selinda Dover, MD;  Location: [facility];  Service: Orthopedics;  Laterality: Left;   IVC FILTER INSERTION N/A 09/28/2022   Procedure: IVC FILTER INSERTION;  Surgeon: Sheree Penne Bruckner, MD;  Location: [facility] INVASIVE CV LAB;  Service: Cardiovascular;  Laterality: N/A;   IVC FILTER INSERTION N/A 08/09/2023   Procedure: IVC FILTER INSERTION;  Surgeon: Sheree Penne Bruckner, MD;  Location: [facility] INVASIVE CV LAB;  Service: Cardiovascular;  Laterality: N/A;   IVC FILTER REMOVAL N/A 01/18/2023   Procedure: IVC FILTER REMOVAL;  Surgeon: Sheree Penne Bruckner, MD;  Location: [facility] INVASIVE CV LAB;  Service: Cardiovascular;  Laterality: N/A;   KIDNEY STONE SURGERY     KNEE ARTHROTOMY Left 02/03/2023   Procedure: KNEE ARTHROTOMY WITH SOFT TISSUE REPAIR;  Surgeon: Fidel Rogue, MD;  Location: [facility];  Service: Orthopedics;  Laterality: Left;  75   LAPAROSCOPIC NISSEN FUNDOPLICATION     PAROTIDECTOMY     REMOVAL OF CEMENTED SPACER KNEE Left 08/25/2023   Procedure: Knee spacer removal;  Surgeon: Fidel Rogue, MD;  Location: [facility];  Service: Orthopedics;  Laterality: Left;  350   TONSILLECTOMY     TOTAL KNEE ARTHROPLASTY Left 11/04/2022   Procedure: TOTAL KNEE ARTHROPLASTY;  Surgeon: Duwayne Purchase, MD;  Location: [facility];  Service: Orthopedics;  Laterality: Left;   TOTAL KNEE REVISION Left 08/25/2023   Procedure: TOTAL KNEE REVISION;  Surgeon: Fidel Rogue, MD;  Location: [facility];  Service: Orthopedics;  Laterality: Left;  350    Current Outpatient Medications  Medication Sig Dispense Refill Last Dose/Taking   albuterol  (PROVENTIL ) (2.5 MG/3ML) 0.083% nebulizer solution Take 2.5 mg by nebulization every 4 (four) hours as needed for wheezing or shortness of breath.      albuterol  (VENTOLIN  HFA) 108 (90 Base) MCG/ACT inhaler Inhale 2 puffs  into the lungs every 6 (six) hours as needed for wheezing or shortness of breath.      ALPRAZolam  (XANAX ) 1 MG tablet Take 1 tablet (1 mg total) by mouth every 6 (six) hours as needed for anxiety or sleep. (Patient taking differently: Take 1-2 mg by mouth every 6 (six) hours as needed for anxiety or sleep.) 30 tablet 0    ascorbic acid  (VITAMIN C ) 500 MG tablet Take 500 mg by mouth daily.      b complex vitamins capsule Take 1 capsule by mouth daily.      cephALEXin  (KEFLEX ) 500 MG capsule Take 500 mg by mouth 2 (two) times daily.      Cholecalciferol (VITAMIN D3) 125 MCG (5000 UT) CAPS Take 5,000 Units by mouth daily.      clotrimazole -betamethasone  (LOTRISONE ) cream Apply 1 Application topically 2 (two) times daily as needed (for irritation under the breasts or abdominal folds).      DULoxetine  (CYMBALTA ) 60 MG capsule Take 60 mg by mouth at bedtime.      famotidine  (PEPCID ) 20 MG tablet TAKE 1 TABLET BY MOUTH AFTER SUPPER 30 tablet 0    gabapentin  (NEURONTIN ) 400 MG capsule Take 400 mg by mouth 3 (three) times daily.      HYDROmorphone  (DILAUDID ) 4 MG tablet  Take 4 mg by mouth 4 (four) times daily as needed for severe pain (pain score 7-10).      naloxone  (NARCAN ) nasal spray 4 mg/0.1 mL Place 1 spray into the nose once as needed (accidental over dose).      nutrition supplement, JUVEN, (JUVEN) PACK Take 1 packet by mouth daily.      pantoprazole  (PROTONIX ) 40 MG tablet Take 40 mg by mouth every morning.      polyethylene glycol (MIRALAX  / GLYCOLAX ) 17 g packet Take 17 g by mouth daily.      promethazine  (PHENERGAN ) 25 MG tablet Take 25 mg by mouth every 6 (six) hours as needed for nausea or vomiting.      senna (SENOKOT) 8.6 MG tablet Take 1 tablet by mouth at bedtime.      tiZANidine  (ZANAFLEX ) 2 MG tablet Take 2 mg by mouth every 12 (twelve) hours as needed for muscle spasms.      XARELTO  20 MG TABS tablet Take 20 mg by mouth daily with supper.      Current Facility-Administered Medications   Medication Dose Route Frequency Provider Last Rate Last Admin   vancomycin  (VANCOCIN ) 1,000 mg in sodium chloride  0.9 % 500 mL IVPB  1,000 mg Intravenous Once Marqual Mi S, PA-C       Allergies  Allergen Reactions   Belbuca  [Buprenorphine  Hcl] Other (See Comments)     Horrible headache    Lyrica [Pregabalin] Other (See Comments)    Paralysis present (finding)- not tolerated The patient stated she could not move her body, but she could think.   Compazine  [Prochlorperazine ] Anxiety   Droperidol Anxiety   Ergotamine-Caffeine Anxiety, Other (See Comments) and Hypertension    Increased systolic arterial pressure (finding)  (caffeine / ergotamine)    Social History   Tobacco Use   Smoking status: Former    Current packs/day: 0.00    Average packs/day: 0.5 packs/day for 10.0 years (5.0 ttl pk-yrs)    Types: Cigarettes    Start date: 02/13/2011    Quit date: 02/12/2021    Years since quitting: 3.6   Smokeless tobacco: Never  Substance Use Topics   Alcohol  use: Not Currently    Family History  Problem Relation Age of Onset   Hypertension Mother      Review of Systems  Musculoskeletal:  Positive for arthralgias, gait problem and joint swelling.  All other systems reviewed and are negative.   Objective:  Physical Exam Constitutional:      Appearance: She is obese.  HENT:     Head: Normocephalic and atraumatic.     Nose: Nose normal.     Mouth/Throat:     Mouth: Mucous membranes are moist.     Pharynx: Oropharynx is clear.  Eyes:     Conjunctiva/sclera: Conjunctivae normal.  Cardiovascular:     Rate and Rhythm: Normal rate and regular rhythm.     Pulses: Normal pulses.     Heart sounds: Normal heart sounds.  Pulmonary:     Effort: Pulmonary effort is normal.     Breath sounds: Normal breath sounds.  Abdominal:     General: Abdomen is flat.     Palpations: Abdomen is soft.  Genitourinary:    Comments: Deferred.  Musculoskeletal:     Cervical back: Normal range of  motion.     Comments: Examination of the right knee reveals no skin wounds or lesions. She has swelling, trace effusion. No warmth or erythema. Mild valgus deformity. Tenderness to palpation medial  joint line, lateral joint line, peripatellar retinacular tissues with a positive grind sign. Range of motion is limited. No instability. No extensor lag.  Sensory and motor function intact in LE bilaterally. Distal pedal pulses 2+ bilaterally.   Mild symmetric pedal edema. Calves soft and non-tender.   Skin:    General: Skin is warm and dry.     Capillary Refill: Capillary refill takes less than 2 seconds.  Neurological:     General: No focal deficit present.     Mental Status: She is alert and oriented to person, place, and time.  Psychiatric:        Mood and Affect: Mood normal.        Behavior: Behavior normal.        Thought Content: Thought content normal.        Judgment: Judgment normal.     Vital signs in last 24 hours: @VSRANGES @  Labs:   Estimated body mass index is 37.44 kg/m as calculated from the following:   Height as of 09/16/24: 5' 5 (1.651 m).   Weight as of 09/16/24: 102.1 kg.   Imaging Review Plain radiographs demonstrate severe degenerative joint disease of the right knee(s). The overall alignment issignificant valgus. The bone quality appears to be adequate for age and reported activity level.      Assessment/Plan:  End stage arthritis, right knee   The patient history, physical examination, clinical judgment of the provider and imaging studies are consistent with end stage degenerative joint disease of the right knee(s) and total knee arthroplasty is deemed medically necessary. The treatment options including medical management, injection therapy arthroscopy and arthroplasty were discussed at length. The risks and benefits of total knee arthroplasty were presented and reviewed. The risks due to aseptic loosening, infection, stiffness, patella tracking  problems, thromboembolic complications and other imponderables were discussed. The patient acknowledged the explanation, agreed to proceed with the plan and consent was signed. Patient is being admitted for inpatient treatment for surgery, pain control, PT, OT, prophylactic antibiotics, VTE prophylaxis, progressive ambulation and ADL's and discharge planning. The patient is planning to be discharged home with OPPT after an overnight stay.   Therapy Plans: outpatient therapy. PT [facility] 10/16/24.  Disposition: Home with [place] Husband Planned DVT Prophylaxis: Xarelto  10mg  daily DME needed: Has rolling walker, cane, wheelchair. Hold on ice machine.  PCP: Cleared hold xarelto  2 days prior to surgery. - New PCP clearance, pneumonia resolved, cleared for surgery.  Vascular: Cleared. Has IVC filter, hold xarelto  3 days prior to surgery.  TXA: IV Allergies:  - Belbuca  - headache - Cafergot - elevated BP - Compazine  - anxiety - Droperidol - anxiety - Lyrica - anxiety - Prochlorperazine  - anxiety.  Anesthesia Concerns: None.  BMI: 37.0 Last HgbA1c: Not diabetic.  Other: - History of PE and DVT, chronic xarelto  and aspirin  use. IVC filter placed 08/09/23. Stop xarelto  3 days prior to surgery.  - COPD - OSA - CAD.  - Diverticulitis in April, but resolved.  - Pneumonia resolved, antibiotics completed.  - Juven drink supplement for wound healing previously.  - Vitamin C  and zinc  PO.  - Patient is very high risk due to history of previous MSSA periprosthetic left knee infection, which is now resolved after numerous procedures including two-stage procedure. She also has multiple VTE episodes. She currently has a filter in place and had a left common iliac DVT earlier last year.  - Chronic cephalexin  500mg  BID.  - Recommend weaning/discontinuing narcotic medications before surgery in  order to improve postop pain control and postop satisfaction. Currently on dilaudid  4mg  q 6 hours per Dr. Bonner  pain management. - Kurtis*.  - Dilaudid  2-4mg  q4, zofran , methocarbamol .  - Labs pending.  [facility].     Patient's anticipated LOS is less than 2 midnights, meeting these requirements: - Younger than 57 - Lives within 1 hour of care - Has a competent adult at home to recover with post-op recover - NO history of  - Chronic pain requiring opiods  - Diabetes  - Coronary Artery Disease  - Heart failure  - Heart attack  - Stroke  - DVT/VTE  - Cardiac arrhythmia  - Respiratory Failure/COPD  - Renal failure  - Anemia  - Advanced Liver disease

## 2024-10-09 NOTE — Assessment & Plan Note (Addendum)
 Continue Xarelto  as tolerated. Stop Xarelto  2 to 3 days before surgery per surgeon's recommendation.  Resume postop at hemostasis and at surgeon's discretion. Monitor for recurrent bleeding or hematoma For the ED if traumatic fall, uncontrolled bleeding immediately. Check renal function at least twice a year with PCP.  Last renal function was normal. Changed to Eliquis if reduced renal clearance in the future.

## 2024-10-09 NOTE — Patient Instructions (Addendum)
 Discussed risk factors for DVT and PE today.  Your blood clotting testing is negative.  This does not mean you do not have risk for future recurrence of blood clot.  You have a personal history of blood clot, also several family members of blood clot. Your parents both have blood clot, daughter had blood clot as well. Your risk factor including personal history, family history, BMI over 30, sedentary in the near future. Your have a IVC filter in place. This is a risk for DVT as well.   Xarelto  will decrease your risk of recurrent blood clot.  Please watch for bleeding, signs of uncontrolled bleeding or internal bleeding.  You should check in with your primary care at least twice a year to check kidney function.  If reduced kidney function, consider changing from Xarelto  to Eliquis in the future.

## 2024-10-10 ENCOUNTER — Other Ambulatory Visit: Payer: Self-pay

## 2024-10-10 ENCOUNTER — Encounter (HOSPITAL_COMMUNITY): Payer: Self-pay

## 2024-10-10 ENCOUNTER — Encounter (HOSPITAL_COMMUNITY)
Admission: RE | Admit: 2024-10-10 | Discharge: 2024-10-10 | Disposition: A | Discharge status: Home / Self Care | Source: Ambulatory Visit | Attending: Orthopedic Surgery | Admitting: Orthopedic Surgery

## 2024-10-10 VITALS — BP 115/77 | HR 91 | Temp 98.5°F | Resp 16

## 2024-10-10 DIAGNOSIS — D649 Anemia, unspecified: Secondary | ICD-10-CM | POA: Insufficient documentation

## 2024-10-10 DIAGNOSIS — Z86711 Personal history of pulmonary embolism: Secondary | ICD-10-CM | POA: Insufficient documentation

## 2024-10-10 DIAGNOSIS — F419 Anxiety disorder, unspecified: Secondary | ICD-10-CM | POA: Insufficient documentation

## 2024-10-10 DIAGNOSIS — Z87891 Personal history of nicotine dependence: Secondary | ICD-10-CM | POA: Insufficient documentation

## 2024-10-10 DIAGNOSIS — E669 Obesity, unspecified: Secondary | ICD-10-CM | POA: Insufficient documentation

## 2024-10-10 DIAGNOSIS — Z86718 Personal history of other venous thrombosis and embolism: Secondary | ICD-10-CM | POA: Insufficient documentation

## 2024-10-10 DIAGNOSIS — G43909 Migraine, unspecified, not intractable, without status migrainosus: Secondary | ICD-10-CM | POA: Insufficient documentation

## 2024-10-10 DIAGNOSIS — G894 Chronic pain syndrome: Secondary | ICD-10-CM | POA: Insufficient documentation

## 2024-10-10 DIAGNOSIS — I2510 Atherosclerotic heart disease of native coronary artery without angina pectoris: Secondary | ICD-10-CM | POA: Insufficient documentation

## 2024-10-10 DIAGNOSIS — F32A Depression, unspecified: Secondary | ICD-10-CM | POA: Insufficient documentation

## 2024-10-10 DIAGNOSIS — K449 Diaphragmatic hernia without obstruction or gangrene: Secondary | ICD-10-CM | POA: Insufficient documentation

## 2024-10-10 DIAGNOSIS — K219 Gastro-esophageal reflux disease without esophagitis: Secondary | ICD-10-CM | POA: Insufficient documentation

## 2024-10-10 DIAGNOSIS — F1120 Opioid dependence, uncomplicated: Secondary | ICD-10-CM | POA: Insufficient documentation

## 2024-10-10 DIAGNOSIS — Z9981 Dependence on supplemental oxygen: Secondary | ICD-10-CM | POA: Insufficient documentation

## 2024-10-10 DIAGNOSIS — Z01812 Encounter for preprocedural laboratory examination: Secondary | ICD-10-CM | POA: Insufficient documentation

## 2024-10-10 DIAGNOSIS — G4733 Obstructive sleep apnea (adult) (pediatric): Secondary | ICD-10-CM | POA: Insufficient documentation

## 2024-10-10 DIAGNOSIS — J9610 Chronic respiratory failure, unspecified whether with hypoxia or hypercapnia: Secondary | ICD-10-CM | POA: Insufficient documentation

## 2024-10-10 DIAGNOSIS — M1711 Unilateral primary osteoarthritis, right knee: Secondary | ICD-10-CM | POA: Insufficient documentation

## 2024-10-10 DIAGNOSIS — Z01818 Encounter for other preprocedural examination: Secondary | ICD-10-CM

## 2024-10-10 DIAGNOSIS — M797 Fibromyalgia: Secondary | ICD-10-CM | POA: Insufficient documentation

## 2024-10-10 DIAGNOSIS — J449 Chronic obstructive pulmonary disease, unspecified: Secondary | ICD-10-CM | POA: Insufficient documentation

## 2024-10-10 HISTORY — DX: Acute embolism and thrombosis of unspecified deep veins of unspecified lower extremity: I82.409

## 2024-10-10 NOTE — Patient Instructions (Addendum)
 SURGICAL WAITING ROOM VISITATION  Patients having surgery or a procedure may have no more than 2 support people in the waiting area - these visitors may rotate.    Children under the age of 43 must have an adult with them who is not the patient.  Visitors with respiratory illnesses are discouraged from visiting and should remain at home.  If the patient needs to stay at the hospital during part of their recovery, the visitor guidelines for inpatient rooms apply. Pre-op nurse will coordinate an appropriate time for 1 support person to accompany patient in pre-op.  This support person may not rotate.    Please refer to the [facility] website for the visitor guidelines for Inpatients (after your surgery is over and you are in a regular room).    Your procedure is scheduled on: 10/12/24   Report to [facility] Main Entrance    Report to admitting at 8:30 AM   Call this number if you have problems the morning of surgery [phone]   Do not eat food :After Midnight.   After Midnight you may have the following liquids until 8:00 AM DAY OF SURGERY  Water  Non-Citrus Juices (without pulp, NO RED-Apple, White grape, White cranberry) Black Coffee (NO MILK/CREAM OR CREAMERS, sugar ok)  Clear Tea (NO MILK/CREAM OR CREAMERS, sugar ok) regular and decaf                             Plain Jell-O (NO RED)                                           Fruit ices (not with fruit pulp, NO RED)                                     Popsicles (NO RED)                                                               Sports drinks like Gatorade (NO RED)                 The day of surgery:  Drink ONE (1) Pre-Surgery Clear Ensure at 8:00 AM the morning of surgery. Drink in one sitting. Do not sip.  This drink was given to you during your hospital  pre-op appointment visit. Nothing else to drink after completing the  Pre-Surgery Clear Ensure.          If you have questions, please contact your  surgeon's office.   FOLLOW BOWEL PREP AND ANY ADDITIONAL PRE OP INSTRUCTIONS YOU RECEIVED FROM YOUR SURGEON'S OFFICE!!!     Oral Hygiene is also important to reduce your risk of infection.                                    Remember - BRUSH YOUR TEETH THE MORNING OF SURGERY WITH YOUR REGULAR TOOTHPASTE  DENTURES WILL BE REMOVED PRIOR TO SURGERY PLEASE DO NOT APPLY Poly grip OR  ADHESIVES!!!   Stop all vitamins and herbal supplements 7 days before surgery.   Take these medicines the morning of surgery with A SIP OF WATER : Inhalers, Alprazolam , Gabapentin , Dilaudid , pantoprazole                                You may not have any metal on your body including hair pins, jewelry, and body piercing             Do not wear make-up, lotions, powders, perfumes, or deodorant  Do not wear nail polish including gel and S&S, artificial/acrylic nails, or any other type of covering on natural nails including finger and toenails. If you have artificial nails, gel coating, etc. that needs to be removed by a nail salon please have this removed prior to surgery or surgery may need to be canceled/ delayed if the surgeon/ anesthesia feels like they are unable to be safely monitored.   Do not shave  48 hours prior to surgery.    Do not bring valuables to the hospital. [facility] IS NOT             RESPONSIBLE   FOR VALUABLES.   Contacts, glasses, dentures or bridgework may not be worn into surgery.   Bring small overnight bag day of surgery.   DO NOT BRING YOUR HOME MEDICATIONS TO THE HOSPITAL. PHARMACY WILL DISPENSE MEDICATIONS LISTED ON YOUR MEDICATION LIST TO YOU DURING YOUR ADMISSION IN THE HOSPITAL!   Special Instructions: Bring a copy of your healthcare power of attorney and living will documents the day of surgery if you haven't scanned them before.              Please read over the following fact sheets you were given: IF YOU HAVE QUESTIONS ABOUT YOUR PRE-OP INSTRUCTIONS PLEASE CALL  [phone].   If you received a COVID test during your pre-op visit  it is requested that you wear a mask when out in public, stay away from anyone that may not be feeling well and notify your surgeon if you develop symptoms. If you test positive for Covid or have been in contact with anyone that has tested positive in the last 10 days please notify you surgeon.      Pre-operative 4 CHG Bath Instructions  DYNA-Hex 4 Chlorhexidine  Gluconate 4% Solution Antiseptic 4 fl. oz   You can play a key role in reducing the risk of infection after surgery. Your skin needs to be as free of germs as possible. You can reduce the number of germs on your skin by washing with CHG (chlorhexidine  gluconate) soap before surgery. CHG is an antiseptic soap that kills germs and continues to kill germs even after washing.   DO NOT use if you have an allergy to chlorhexidine /CHG or antibacterial soaps. If your skin becomes reddened or irritated, stop using the CHG and notify one of our RNs at   Please shower with the CHG soap starting 4 days before surgery using the following schedule:     Please keep in mind the following:  DO NOT shave, including legs and underarms, starting the day of your first shower.   You may shave your face at any point before/day of surgery.  Place clean sheets on your bed the day you start using CHG soap. Use a clean washcloth (not used since being washed) for each shower. DO NOT sleep with pets once you start using the  CHG.  CHG Shower Instructions:  If you choose to wash your hair and private area, wash first with your normal shampoo/soap.  After you use shampoo/soap, rinse your hair and body thoroughly to remove shampoo/soap residue.  Turn the water  OFF and apply about 3 tablespoons (45 ml) of CHG soap to a CLEAN washcloth.  Apply CHG soap ONLY FROM YOUR NECK DOWN TO YOUR TOES (washing for 3-5 minutes)  DO NOT use CHG soap on face, private areas, open wounds, or sores.   Pay special attention to the area where your surgery is being performed.  If you are having back surgery, having someone wash your back for you may be helpful. Wait 2 minutes after CHG soap is applied, then you may rinse off the CHG soap.  Pat dry with a clean towel  Put on clean clothes/pajamas   If you choose to wear lotion, please use ONLY the CHG-compatible lotions on the back of this paper.     Additional instructions for the day of surgery: DO NOT APPLY any lotions, deodorants, cologne, or perfumes.   Put on clean/comfortable clothes.  Brush your teeth.  Ask your nurse before applying any prescription medications to the skin.   CHG Compatible Lotions   Aveeno Moisturizing lotion  Cetaphil Moisturizing Cream  Cetaphil Moisturizing Lotion  Clairol Herbal Essence Moisturizing Lotion, Dry Skin  Clairol Herbal Essence Moisturizing Lotion, Extra Dry Skin  Clairol Herbal Essence Moisturizing Lotion, Normal Skin  Curel Age Defying Therapeutic Moisturizing Lotion with Alpha Hydroxy  Curel Extreme Care Body Lotion  Curel Soothing Hands Moisturizing Hand Lotion  Curel Therapeutic Moisturizing Cream, Fragrance-Free  Curel Therapeutic Moisturizing Lotion, Fragrance-Free  Curel Therapeutic Moisturizing Lotion, Original Formula  Eucerin Daily Replenishing Lotion  Eucerin Dry Skin Therapy Plus Alpha Hydroxy Crme  Eucerin Dry Skin Therapy Plus Alpha Hydroxy Lotion  Eucerin Original Crme  Eucerin Original Lotion  Eucerin Plus Crme Eucerin Plus Lotion  Eucerin TriLipid Replenishing Lotion  Keri Anti-Bacterial Hand Lotion  Keri Deep Conditioning Original Lotion Dry Skin Formula Softly Scented  Keri Deep Conditioning Original Lotion, Fragrance Free Sensitive Skin Formula  Keri Lotion Fast Absorbing Fragrance Free Sensitive Skin Formula  Keri Lotion Fast Absorbing Softly Scented Dry Skin Formula  Keri Original Lotion  Keri Skin Renewal Lotion Keri Silky Smooth Lotion  Keri Silky Smooth  Sensitive Skin Lotion  Nivea Body Creamy Conditioning Oil  Nivea Body Extra Enriched Lotion  Nivea Body Original Lotion  Nivea Body Sheer Moisturizing Lotion Nivea Crme  Nivea Skin Firming Lotion  NutraDerm 30 Skin Lotion  NutraDerm Skin Lotion  NutraDerm Therapeutic Skin Cream  NutraDerm Therapeutic Skin Lotion  ProShield Protective Hand Cream  Provon moisturizing lotion  View Pre-Surgery Education Videos:  [email or web address]     Incentive Spirometer  An incentive spirometer is a tool that can help keep your lungs clear and active. This tool measures how well you are filling your lungs with each breath. Taking long deep breaths may help reverse or decrease the chance of developing breathing (pulmonary) problems (especially infection) following: A long period of time when you are unable to move or be active. BEFORE THE PROCEDURE  If the spirometer includes an indicator to show your best effort, your nurse or respiratory therapist will set it to a desired goal. If possible, sit up straight or lean slightly forward. Try not to slouch. Hold the incentive spirometer in an upright position. INSTRUCTIONS FOR USE  Sit on the edge of your bed if possible, or sit up  as far as you can in bed or on a chair. Hold the incentive spirometer in an upright position. Breathe out normally. Place the mouthpiece in your mouth and seal your lips tightly around it. Breathe in slowly and as deeply as possible, raising the piston or the ball toward the top of the column. Hold your breath for 3-5 seconds or for as long as possible. Allow the piston or ball to fall to the bottom of the column. Remove the mouthpiece from your mouth and breathe out normally. Rest for a few seconds and repeat Steps 1 through 7 at least 10 times every 1-2 hours when you are awake. Take your time and take a few normal breaths between deep breaths. The spirometer may  include an indicator to show your best effort. Use the indicator as a goal to work toward during each repetition. After each set of 10 deep breaths, practice coughing to be sure your lungs are clear. If you have an incision (the cut made at the time of surgery), support your incision when coughing by placing a pillow or rolled up towels firmly against it. Once you are able to get out of bed, walk around indoors and cough well. You may stop using the incentive spirometer when instructed by your caregiver.  RISKS AND COMPLICATIONS Take your time so you do not get dizzy or light-headed. If you are in pain, you may need to take or ask for pain medication before doing incentive spirometry. It is harder to take a deep breath if you are having pain. AFTER USE Rest and breathe slowly and easily. It can be helpful to keep track of a log of your progress. Your caregiver can provide you with a simple table to help with this. If you are using the spirometer at home, follow these instructions: SEEK MEDICAL CARE IF:  You are having difficultly using the spirometer. You have trouble using the spirometer as often as instructed. Your pain medication is not giving enough relief while using the spirometer. You develop fever of 100.5 F (38.1 C) or higher. SEEK IMMEDIATE MEDICAL CARE IF:  You cough up bloody sputum that had not been present before. You develop fever of 102 F (38.9 C) or greater. You develop worsening pain at or near the incision site. MAKE SURE YOU:  Understand these instructions. Will watch your condition. Will get help right away if you are not doing well or get worse. Document Released: 04/12/2007 Document Revised: 02/22/2012 Document Reviewed: 06/13/2007 [facility] Patient Information 2014 [place], [place].   ________________________________________________________________________

## 2024-10-10 NOTE — Progress Notes (Addendum)
 COVID Vaccine Completed: yes  Date of COVID positive in last 90 days:  PCP - Thresa Familia, MD Cardiologist -  Pulmonary- Ozell America, MD LOV 06/14/24 in Epic  Hematologist- Pauletta Chihuahua, MD  Clearance in media tab dated 10/04/24 Media tab  Chest x-ray - 09/16/24 Epic  EKG - 09/16/24 Epic Stress Test - N/A ECHO - 02/14/21 [facility] Cardiac Cath - n/a Pacemaker/ICD device last checked:N/A Spinal Cord Stimulator:N/A  Bowel Prep - N/A  Sleep Study - yes CPAP - no per pt never finished   Fasting Blood Sugar - N/A Checks Blood Sugar _____ times a day  Last dose of GLP1 agonist-  N/A GLP1 instructions:  Do not take after     Last dose of SGLT-2 inhibitors-  N/A SGLT-2 instructions:  Do not take after     Blood Thinner Instructions: Xarelto , hold 3 days Aspirin  Instructions:N/A Last Dose: 10/08/24 2000  Activity level: Can perform activities of daily living without stopping and without symptoms of chest pain or shortness of breath. Cannot do stairs due to knee. Can ambulate with walker to bathroom  Anesthesia review: atherosclerotic heart disease, COPD, OSA, anemia, PE, chronic 2L O2 PRN, DVT, IVC filter, in ED for weakness 09/16/24  Patient denies shortness of breath, fever, cough and chest pain at PAT appointment  Patient verbalized understanding of instructions that were given to them at the PAT appointment. Patient was also instructed that they will need to review over the PAT instructions again at home before surgery.

## 2024-10-11 ENCOUNTER — Encounter (HOSPITAL_COMMUNITY): Payer: Self-pay

## 2024-10-11 NOTE — Progress Notes (Signed)
 Collect PCR DOS

## 2024-10-11 NOTE — Progress Notes (Signed)
 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  Knee surgery scheduled in August 2025 however postponed due to admission for pneumonia.  Pt follows with Vascular for hx of IVC filter insertion. Per Dr. Sheree IVC filter will remain in place until she is completed with procedures and/or has become more ambulatory. Vascular clearance signed that patient is cleared from medical/cardiac perspective and moderate risk (scanned in media on 10/22). She has a diagnosis of CAD in her PMHx but no evidence of this per chart review. She has some aortic atherosclerosis noted on CT scans  Patient follows with Pulmonology for COPD with chronic respiratory failure. Last seen by Dr. Darlean on 06/14/24. Pt with chronic cough, mostly mobilizes with walker. Uses inhalers. Per Dr. Darlean: She is back to baseline with predominantly restrictive problems related to obesity and kyphosis > no change baseline rx  Pt subsequently had admission from 8/29-9/2 for multifocal pneumonia and acute on chronic respiratory failure. Treated with broad spectrum abx and improved.   Seen by PCP on 09/28/24 for pre op clearance. Per Dr. Dreama: Norma Anthony is in acceptable medical condition to undergo the proposed procedure. She is at moderate risk for post operative complications given her underlying  co-morbid conditions. (Scanned in media on 10/04/24)  Seen by Hematology on 10/27. Per Dr. Tina: She will need a surgery for right knee replacement.  This has been scheduled.  She has multiple surgeries on the left knee in the past.  She used to hold anticoagulation 3 days before surgery and resume afterwards.  She denies any issues or complications in the past.    LD Xarelto : 10/26 @ 2000  VS: BP 115/77   Pulse 91   Temp 36.9 C (Oral)   Resp 16   SpO2 93%   PROVIDERS: Dreama Mcardle, MD Pulmonary- Ozell Darlean, MD LOV 06/14/24 in Epic  Hematologist- Pauletta Tina, MD  LABS: Labs reviewed: Acceptable for surgery. (all labs ordered are listed, but only abnormal results are displayed)  Labs Reviewed - No data to display   IMAGES:   EKG 09/16/24:  Sinus rhythm Abnormal R-wave progression, early transition Left ventricular hypertrophy Anterior Q waves, possibly due to LVH No significant change since last tracing  Echo 02/14/2021 ([facility]):  Impression Left ventricle is normal in size and function with EF 60-65%.  Normal LV wall thickness.  Normal diastolic function. 2.  Right ventricle is normal in size and function. 3.  No valvular abnormalities. 4.  Normal right ventricular systolic pressure.  No evidence of pulmonary hypertension. 5.  No pericardial effusion.    Past Medical History:  Diagnosis Date   Acute respiratory failure with hypoxia (HCC) 03/23/2022   Acute respiratory failure with hypoxia (HCC) 03/23/2022   Anemia    Anxiety    Arthritis    CAD (coronary artery disease)    pt deneies   COPD (chronic obstructive pulmonary  disease) (HCC)    Depression    DVT (deep venous thrombosis) (HCC)    Dyspnea    Fibromyalgia    Generalized anxiety disorder 12/11/2022   GERD (gastroesophageal reflux disease)    Hiatal hernia    History of kidney stones    Lung nodule 03/24/2022   Migraine headache    Multinodular goiter    Nephrolithiasis    Obstructive  sleep apnea syndrome 07/01/2021   Opioid dependence (HCC) 03/23/2022   Pneumonia    Pulmonary embolism (HCC) 1996   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   Sleep apnea    Thyroid  nodule     Past Surgical History:  Procedure Laterality Date   ABDOMINAL HYSTERECTOMY     BIOPSY THYROID      EXCISIONAL TOTAL KNEE ARTHROPLASTY WITH ANTIBIOTIC SPACERS Left 03/10/2023   Procedure: Irrigation and debridement left knee, resection total knee arthroplasty, antibiotic spacer placement;  Surgeon: Fidel Rogue, MD;  Location[facility];  Service: Orthopedics;  Laterality: Left;   HERNIA REPAIR     I & D EXTREMITY Left 12/11/2022   Procedure: IRRIGATION AND DEBRIDEMENT EXTREMITY;  Surgeon: Sharl Selinda Dover, MD;  [facility];  Service: Orthopedics;  Laterality: Left;   IVC FILTER INSERTION N/A 09/28/2022   Procedure: IVC FILTER INSERTION;  Surgeon: Sheree Penne Bruckner, MD;  Location: [facility] INVASIVE CV LAB;  Service: Cardiovascular;  Laterality: N/A;   IVC FILTER INSERTION N/A 08/09/2023   Procedure: IVC FILTER INSERTION;  Surgeon: Sheree Penne Bruckner, MD;  Location: [facility] INVASIVE CV LAB;  Service: Cardiovascular;  Laterality: N/A;   IVC FILTER REMOVAL N/A 01/18/2023   Procedure: IVC FILTER REMOVAL;  Surgeon: Sheree Penne Bruckner, MD;  Location: [facility] INVASIVE CV LAB;  Service: Cardiovascular;  Laterality: N/A;   KIDNEY STONE SURGERY     KNEE ARTHROTOMY Left 02/03/2023   Procedure: KNEE ARTHROTOMY WITH SOFT TISSUE REPAIR;  Surgeon: Fidel Rogue, MD;  [facility];  Service: Orthopedics;  Laterality: Left;  75   LAPAROSCOPIC NISSEN FUNDOPLICATION     PAROTIDECTOMY     REMOVAL OF CEMENTED SPACER KNEE Left 08/25/2023   Procedure: Knee spacer removal;  Surgeon: Fidel Rogue, MD;  [facility];  Service: Orthopedics;  Laterality: Left;   350   TONSILLECTOMY     TOTAL KNEE ARTHROPLASTY Left 11/04/2022   Procedure: TOTAL KNEE ARTHROPLASTY;  Surgeon: Duwayne Purchase, MD;  [facility];  Service: Orthopedics;  Laterality: Left;   TOTAL KNEE REVISION Left 08/25/2023   Procedure: TOTAL KNEE REVISION;  Surgeon: Fidel Rogue, MD;  [facility];  Service: Orthopedics;  Laterality: Left;  350    MEDICATIONS:  albuterol  (PROVENTIL ) (2.5 MG/3ML) 0.083% nebulizer solution   albuterol  (VENTOLIN  HFA) 108 (90 Base) MCG/ACT inhaler   ALPRAZolam  (XANAX ) 1 MG tablet   ascorbic acid  (VITAMIN C ) 500 MG tablet   b complex vitamins capsule   cephALEXin  (KEFLEX ) 500 MG capsule   Cholecalciferol (VITAMIN D3) 125 MCG (5000 UT) CAPS   clotrimazole -betamethasone  (LOTRISONE ) cream   DULoxetine  (CYMBALTA ) 60 MG capsule   famotidine  (PEPCID ) 20 MG tablet   gabapentin  (NEURONTIN ) 400 MG capsule   HYDROmorphone  (DILAUDID ) 4 MG tablet   naloxone  (NARCAN ) nasal spray 4 mg/0.1 mL   nutrition supplement, JUVEN, (JUVEN) PACK   pantoprazole  (PROTONIX ) 40 MG tablet   polyethylene glycol (MIRALAX  / GLYCOLAX ) 17 g packet  promethazine  (PHENERGAN ) 25 MG tablet   senna (SENOKOT) 8.6 MG tablet   tiZANidine  (ZANAFLEX ) 2 MG tablet   XARELTO  20 MG TABS tablet   No current facility-administered medications for this encounter.    vancomycin  (VANCOCIN ) 1,000 mg in sodium chloride  0.9 % 500 mL IVPB   Burnard CHRISTELLA Senna, PA-C [facility] Short Stay/Anesthesiology [facility] Phone [phone] 10/11/2024 9:31 AM

## 2024-10-11 NOTE — Anesthesia Preprocedure Evaluation (Signed)
 Anesthesia Evaluation  Patient identified by MRN, date of birth, ID band Patient awake    Reviewed: Allergy & Precautions, H&P , NPO status , Patient's Chart, lab work & pertinent test results  Airway Mallampati: III  TM Distance: >3 FB Neck ROM: Full    Dental no notable dental hx. (+) Edentulous Upper, Edentulous Lower, Dental Advisory Given   Pulmonary shortness of breath, sleep apnea and Oxygen sleep apnea , COPD, former smoker, PE   Pulmonary exam normal breath sounds clear to auscultation       Cardiovascular + DOE and + DVT   Rhythm:Regular Rate:Normal     Neuro/Psych  Headaches  Anxiety Depression       GI/Hepatic Neg liver ROS, hiatal hernia,GERD  Medicated,,  Endo/Other    Class 3 obesity  Renal/GU negative Renal ROS  negative genitourinary   Musculoskeletal  (+) Arthritis , Osteoarthritis,  Fibromyalgia -  Abdominal   Peds  Hematology  (+) Blood dyscrasia, anemia   Anesthesia Other Findings   Reproductive/Obstetrics negative OB ROS                              Anesthesia Physical Anesthesia Plan  ASA: 3  Anesthesia Plan: General   Post-op Pain Management: Regional block*   Induction: Intravenous  PONV Risk Score and Plan: 4 or greater and Ondansetron , Dexamethasone , Propofol  infusion and TIVA  Airway Management Planned: Oral ETT  Additional Equipment:   Intra-op Plan:   Post-operative Plan: Extubation in OR  Informed Consent: I have reviewed the patients History and Physical, chart, labs and discussed the procedure including the risks, benefits and alternatives for the proposed anesthesia with the patient or authorized representative who has indicated his/her understanding and acceptance.     Dental advisory given  Plan Discussed with: CRNA  Anesthesia Plan Comments: (See PAT note from 10/28)         Anesthesia Quick Evaluation

## 2024-10-12 ENCOUNTER — Ambulatory Visit (HOSPITAL_COMMUNITY): Payer: Self-pay | Admitting: Medical

## 2024-10-12 ENCOUNTER — Ambulatory Visit (HOSPITAL_COMMUNITY): Admitting: Anesthesiology

## 2024-10-12 ENCOUNTER — Ambulatory Visit (HOSPITAL_COMMUNITY)

## 2024-10-12 ENCOUNTER — Encounter (HOSPITAL_COMMUNITY): Payer: Self-pay | Admitting: Orthopedic Surgery

## 2024-10-12 ENCOUNTER — Other Ambulatory Visit: Payer: Self-pay

## 2024-10-12 ENCOUNTER — Encounter (HOSPITAL_COMMUNITY)
Admission: RE | Disposition: A | Discharge status: Home / Self Care | Payer: Self-pay | Source: Home / Self Care | Attending: Orthopedic Surgery

## 2024-10-12 ENCOUNTER — Observation Stay (HOSPITAL_COMMUNITY)
Admission: RE | Admit: 2024-10-12 | Discharge: 2024-10-16 | Disposition: A | Attending: Orthopedic Surgery | Admitting: Orthopedic Surgery

## 2024-10-12 DIAGNOSIS — M1711 Unilateral primary osteoarthritis, right knee: Secondary | ICD-10-CM | POA: Diagnosis not present

## 2024-10-12 DIAGNOSIS — Z87891 Personal history of nicotine dependence: Secondary | ICD-10-CM | POA: Diagnosis not present

## 2024-10-12 DIAGNOSIS — Z96651 Presence of right artificial knee joint: Principal | ICD-10-CM

## 2024-10-12 DIAGNOSIS — I2510 Atherosclerotic heart disease of native coronary artery without angina pectoris: Secondary | ICD-10-CM | POA: Insufficient documentation

## 2024-10-12 DIAGNOSIS — Z96652 Presence of left artificial knee joint: Secondary | ICD-10-CM | POA: Insufficient documentation

## 2024-10-12 DIAGNOSIS — Z01818 Encounter for other preprocedural examination: Secondary | ICD-10-CM

## 2024-10-12 DIAGNOSIS — J449 Chronic obstructive pulmonary disease, unspecified: Secondary | ICD-10-CM | POA: Diagnosis not present

## 2024-10-12 DIAGNOSIS — M25561 Pain in right knee: Secondary | ICD-10-CM | POA: Diagnosis present

## 2024-10-12 HISTORY — PX: KNEE ARTHROPLASTY: SHX992

## 2024-10-12 SURGERY — ARTHROPLASTY, KNEE, TOTAL, USING IMAGELESS COMPUTER-ASSISTED NAVIGATION
Anesthesia: General | Site: Knee | Laterality: Right

## 2024-10-12 MED ORDER — FENTANYL CITRATE (PF) 100 MCG/2ML IJ SOLN
INTRAMUSCULAR | Status: DC | PRN
  Administered 2024-10-12: 50 ug via INTRAVENOUS
  Administered 2024-10-12: 100 ug via INTRAVENOUS
  Administered 2024-10-12: 50 ug via INTRAVENOUS

## 2024-10-12 MED ORDER — FENTANYL CITRATE (PF) 100 MCG/2ML IJ SOLN
INTRAMUSCULAR | Status: AC
  Filled 2024-10-12: qty 2

## 2024-10-12 MED ORDER — PHENOL 1.4 % MT LIQD: 1.0000 | OROMUCOSAL | Status: DC | PRN

## 2024-10-12 MED ORDER — METHOCARBAMOL 500 MG PO TABS
ORAL_TABLET | ORAL | Status: AC
  Filled 2024-10-12: qty 1

## 2024-10-12 MED ORDER — HYDROMORPHONE HCL 2 MG/ML IJ SOLN
INTRAMUSCULAR | Status: AC
  Filled 2024-10-12: qty 1

## 2024-10-12 MED ORDER — VITAMIN D 25 MCG (1000 UNIT) PO TABS
5000.0000 [IU] | ORAL_TABLET | Freq: Every day | ORAL | Status: DC
  Administered 2024-10-13 – 2024-10-16 (×4): 5000 [IU] via ORAL
  Filled 2024-10-12 (×4): qty 5

## 2024-10-12 MED ORDER — HYDROMORPHONE HCL 1 MG/ML IJ SOLN
INTRAMUSCULAR | Status: DC | PRN
  Administered 2024-10-12 (×2): 1 mg via INTRAVENOUS

## 2024-10-12 MED ORDER — BISACODYL 10 MG RE SUPP: 10.0000 mg | Freq: Every day | RECTAL | Status: DC | PRN

## 2024-10-12 MED ORDER — SUGAMMADEX SODIUM 200 MG/2ML IV SOLN
INTRAVENOUS | Status: DC | PRN
  Administered 2024-10-12: 200 mg via INTRAVENOUS

## 2024-10-12 MED ORDER — ALBUTEROL SULFATE (2.5 MG/3ML) 0.083% IN NEBU: 2.5000 mg | INHALATION_SOLUTION | RESPIRATORY_TRACT | Status: DC | PRN

## 2024-10-12 MED ORDER — METOCLOPRAMIDE HCL 5 MG/ML IJ SOLN
5.0000 mg | Freq: Three times a day (TID) | INTRAMUSCULAR | Status: DC | PRN
  Administered 2024-10-13: 10 mg via INTRAVENOUS
  Filled 2024-10-12: qty 2

## 2024-10-12 MED ORDER — MENTHOL 3 MG MT LOZG: 1.0000 | LOZENGE | OROMUCOSAL | Status: DC | PRN

## 2024-10-12 MED ORDER — ACETAMINOPHEN 500 MG PO TABS
1000.0000 mg | ORAL_TABLET | Freq: Four times a day (QID) | ORAL | Status: AC
  Administered 2024-10-12 – 2024-10-13 (×4): 1000 mg via ORAL
  Filled 2024-10-12 (×4): qty 2

## 2024-10-12 MED ORDER — CEPHALEXIN 500 MG PO CAPS
500.0000 mg | ORAL_CAPSULE | Freq: Two times a day (BID) | ORAL | Status: DC
  Administered 2024-10-13 – 2024-10-16 (×7): 500 mg via ORAL
  Filled 2024-10-12 (×7): qty 1

## 2024-10-12 MED ORDER — B COMPLEX VITAMINS PO CAPS: 1.0000 | ORAL_CAPSULE | Freq: Every day | ORAL | Status: DC

## 2024-10-12 MED ORDER — CHLORHEXIDINE GLUCONATE 0.12 % MT SOLN
15.0000 mL | Freq: Once | OROMUCOSAL | Status: AC
  Administered 2024-10-12: 15 mL via OROMUCOSAL

## 2024-10-12 MED ORDER — FERROUS SULFATE 325 (65 FE) MG PO TABS
325.0000 mg | ORAL_TABLET | Freq: Three times a day (TID) | ORAL | Status: DC
  Administered 2024-10-12 – 2024-10-16 (×12): 325 mg via ORAL
  Filled 2024-10-12 (×12): qty 1

## 2024-10-12 MED ORDER — HYDROMORPHONE HCL 2 MG PO TABS
2.0000 mg | ORAL_TABLET | ORAL | Status: DC | PRN
  Administered 2024-10-13 – 2024-10-15 (×6): 2 mg via ORAL
  Filled 2024-10-12 (×6): qty 1

## 2024-10-12 MED ORDER — HYDROMORPHONE HCL 1 MG/ML IJ SOLN
0.5000 mg | INTRAMUSCULAR | Status: DC | PRN
  Administered 2024-10-16: 1 mg via INTRAVENOUS
  Filled 2024-10-12 (×3): qty 1

## 2024-10-12 MED ORDER — KETAMINE HCL 50 MG/5ML IJ SOSY
PREFILLED_SYRINGE | INTRAMUSCULAR | Status: AC
  Filled 2024-10-12: qty 5

## 2024-10-12 MED ORDER — VITAMIN C 500 MG PO TABS
1000.0000 mg | ORAL_TABLET | Freq: Every day | ORAL | Status: DC
  Administered 2024-10-13 – 2024-10-16 (×4): 1000 mg via ORAL
  Filled 2024-10-12 (×4): qty 2

## 2024-10-12 MED ORDER — METHOCARBAMOL 1000 MG/10ML IJ SOLN: 500.0000 mg | Freq: Four times a day (QID) | INTRAMUSCULAR | Status: DC | PRN

## 2024-10-12 MED ORDER — RIVAROXABAN 10 MG PO TABS
10.0000 mg | ORAL_TABLET | Freq: Every day | ORAL | Status: DC
  Administered 2024-10-13 – 2024-10-16 (×4): 10 mg via ORAL
  Filled 2024-10-12 (×4): qty 1

## 2024-10-12 MED ORDER — JUVEN PO PACK
1.0000 | PACK | Freq: Every day | ORAL | Status: DC
  Administered 2024-10-13 – 2024-10-16 (×4): 1 via ORAL
  Filled 2024-10-12 (×4): qty 1

## 2024-10-12 MED ORDER — CEFAZOLIN SODIUM-DEXTROSE 2-4 GM/100ML-% IV SOLN
2.0000 g | INTRAVENOUS | Status: AC
  Administered 2024-10-12: 2 g via INTRAVENOUS
  Filled 2024-10-12: qty 100

## 2024-10-12 MED ORDER — LABETALOL HCL 5 MG/ML IV SOLN
5.0000 mg | INTRAVENOUS | Status: DC | PRN
Start: 2024-10-12 — End: 2024-10-12
  Administered 2024-10-12: 5 mg via INTRAVENOUS

## 2024-10-12 MED ORDER — MIDAZOLAM HCL 2 MG/2ML IJ SOLN
INTRAMUSCULAR | Status: AC
  Administered 2024-10-12: 2 mg via INTRAVENOUS
  Filled 2024-10-12: qty 2

## 2024-10-12 MED ORDER — BUPIVACAINE-EPINEPHRINE (PF) 0.25% -1:200000 IJ SOLN
INTRAMUSCULAR | Status: AC
  Filled 2024-10-12: qty 30

## 2024-10-12 MED ORDER — BUPIVACAINE-EPINEPHRINE (PF) 0.5% -1:200000 IJ SOLN
INTRAMUSCULAR | Status: DC | PRN
Start: 2024-10-12 — End: 2024-10-12
  Administered 2024-10-12: 20 mL via PERINEURAL

## 2024-10-12 MED ORDER — POVIDONE-IODINE 10 % EX SWAB: 2.0000 | Freq: Once | CUTANEOUS | Status: DC

## 2024-10-12 MED ORDER — VITAMIN D3 125 MCG (5000 UT) PO CAPS: 5000.0000 [IU] | ORAL_CAPSULE | Freq: Every day | ORAL | Status: DC

## 2024-10-12 MED ORDER — ISOPROPYL ALCOHOL 70 % SOLN
Status: DC | PRN
  Administered 2024-10-12: 1 via TOPICAL

## 2024-10-12 MED ORDER — PROPOFOL 10 MG/ML IV BOLUS
INTRAVENOUS | Status: DC | PRN
  Administered 2024-10-12: 150 mg via INTRAVENOUS

## 2024-10-12 MED ORDER — PANTOPRAZOLE SODIUM 40 MG PO TBEC
40.0000 mg | DELAYED_RELEASE_TABLET | Freq: Every day | ORAL | Status: DC
  Administered 2024-10-13 – 2024-10-16 (×4): 40 mg via ORAL
  Filled 2024-10-12 (×5): qty 1

## 2024-10-12 MED ORDER — ORAL CARE MOUTH RINSE: 15.0000 mL | Freq: Once | OROMUCOSAL | Status: AC

## 2024-10-12 MED ORDER — NALOXONE HCL 4 MG/0.1ML NA LIQD: 1.0000 | Freq: Once | NASAL | Status: DC | PRN

## 2024-10-12 MED ORDER — CLOTRIMAZOLE-BETAMETHASONE 1-0.05 % EX CREA
1.0000 | TOPICAL_CREAM | Freq: Two times a day (BID) | CUTANEOUS | Status: DC | PRN
  Filled 2024-10-12: qty 15

## 2024-10-12 MED ORDER — FAMOTIDINE 20 MG PO TABS
20.0000 mg | ORAL_TABLET | Freq: Every day | ORAL | Status: DC
  Administered 2024-10-12 – 2024-10-15 (×4): 20 mg via ORAL
  Filled 2024-10-12 (×4): qty 1

## 2024-10-12 MED ORDER — FENTANYL CITRATE (PF) 50 MCG/ML IJ SOSY
25.0000 ug | PREFILLED_SYRINGE | INTRAMUSCULAR | Status: DC | PRN
  Administered 2024-10-12: 50 ug via INTRAVENOUS

## 2024-10-12 MED ORDER — SODIUM CHLORIDE 0.9 % IV SOLN: INTRAVENOUS | Status: DC

## 2024-10-12 MED ORDER — HYDROMORPHONE HCL 2 MG PO TABS
3.0000 mg | ORAL_TABLET | ORAL | Status: DC | PRN
  Administered 2024-10-12 – 2024-10-16 (×6): 4 mg via ORAL
  Filled 2024-10-12 (×7): qty 2

## 2024-10-12 MED ORDER — ACETAMINOPHEN 325 MG PO TABS
325.0000 mg | ORAL_TABLET | Freq: Four times a day (QID) | ORAL | Status: DC | PRN
  Administered 2024-10-14 – 2024-10-15 (×2): 650 mg via ORAL
  Filled 2024-10-12 (×2): qty 2

## 2024-10-12 MED ORDER — METOCLOPRAMIDE HCL 5 MG PO TABS
5.0000 mg | ORAL_TABLET | Freq: Three times a day (TID) | ORAL | Status: DC | PRN
  Administered 2024-10-12 – 2024-10-15 (×4): 10 mg via ORAL
  Filled 2024-10-12 (×4): qty 2

## 2024-10-12 MED ORDER — KETAMINE HCL 50 MG/ML IJ SOLN
INTRAMUSCULAR | Status: DC | PRN
Start: 2024-10-12 — End: 2024-10-12
  Administered 2024-10-12: 35 mg via INTRAMUSCULAR
  Administered 2024-10-12: 15 mg via INTRAMUSCULAR

## 2024-10-12 MED ORDER — ROCURONIUM BROMIDE 10 MG/ML (PF) SYRINGE
PREFILLED_SYRINGE | INTRAVENOUS | Status: AC
  Filled 2024-10-12: qty 10

## 2024-10-12 MED ORDER — FENTANYL CITRATE (PF) 50 MCG/ML IJ SOSY
PREFILLED_SYRINGE | INTRAMUSCULAR | Status: AC
  Filled 2024-10-12: qty 3

## 2024-10-12 MED ORDER — LABETALOL HCL 5 MG/ML IV SOLN
INTRAVENOUS | Status: AC
Start: 2024-10-12 — End: 2024-10-12
  Filled 2024-10-12: qty 4

## 2024-10-12 MED ORDER — SODIUM CHLORIDE 0.9 % IR SOLN
Status: DC | PRN
  Administered 2024-10-12: 250 mL
  Administered 2024-10-12: 3000 mL

## 2024-10-12 MED ORDER — KETOROLAC TROMETHAMINE 30 MG/ML IJ SOLN
INTRAMUSCULAR | Status: AC
  Filled 2024-10-12: qty 1

## 2024-10-12 MED ORDER — POLYETHYLENE GLYCOL 3350 17 G PO PACK
17.0000 g | PACK | Freq: Every day | ORAL | Status: DC | PRN
  Administered 2024-10-16: 17 g via ORAL
  Filled 2024-10-12: qty 1

## 2024-10-12 MED ORDER — SUGAMMADEX SODIUM 200 MG/2ML IV SOLN
INTRAVENOUS | Status: AC
  Filled 2024-10-12: qty 2

## 2024-10-12 MED ORDER — PROPOFOL 10 MG/ML IV BOLUS
INTRAVENOUS | Status: AC
  Filled 2024-10-12: qty 20

## 2024-10-12 MED ORDER — METHOCARBAMOL 500 MG PO TABS
500.0000 mg | ORAL_TABLET | Freq: Four times a day (QID) | ORAL | Status: DC | PRN
  Administered 2024-10-12 – 2024-10-16 (×6): 500 mg via ORAL
  Filled 2024-10-12 (×5): qty 1

## 2024-10-12 MED ORDER — LIDOCAINE HCL (CARDIAC) PF 100 MG/5ML IV SOSY
PREFILLED_SYRINGE | INTRAVENOUS | Status: DC | PRN
  Administered 2024-10-12: 60 mg via INTRAVENOUS

## 2024-10-12 MED ORDER — ROCURONIUM BROMIDE 100 MG/10ML IV SOLN
INTRAVENOUS | Status: DC | PRN
  Administered 2024-10-12: 50 mg via INTRAVENOUS

## 2024-10-12 MED ORDER — LACTATED RINGERS IV SOLN: INTRAVENOUS | Status: DC

## 2024-10-12 MED ORDER — SODIUM CHLORIDE (PF) 0.9 % IJ SOLN
INTRAMUSCULAR | Status: AC
  Filled 2024-10-12: qty 30

## 2024-10-12 MED ORDER — SENNA 8.6 MG PO TABS
1.0000 | ORAL_TABLET | Freq: Two times a day (BID) | ORAL | Status: DC
  Administered 2024-10-12 – 2024-10-16 (×8): 8.6 mg via ORAL
  Filled 2024-10-12 (×8): qty 1

## 2024-10-12 MED ORDER — MIDAZOLAM HCL (PF) 2 MG/2ML IJ SOLN: 1.0000 mg | Freq: Once | INTRAMUSCULAR | Status: AC

## 2024-10-12 MED ORDER — STERILE WATER FOR IRRIGATION IR SOLN
Status: DC | PRN
  Administered 2024-10-12: 2000 mL

## 2024-10-12 MED ORDER — GABAPENTIN 400 MG PO CAPS
400.0000 mg | ORAL_CAPSULE | Freq: Three times a day (TID) | ORAL | Status: DC
  Administered 2024-10-12 – 2024-10-16 (×12): 400 mg via ORAL
  Filled 2024-10-12 (×12): qty 1

## 2024-10-12 MED ORDER — B COMPLEX-C PO TABS
1.0000 | ORAL_TABLET | Freq: Every day | ORAL | Status: DC
  Administered 2024-10-13 – 2024-10-16 (×4): 1 via ORAL
  Filled 2024-10-12 (×4): qty 1

## 2024-10-12 MED ORDER — NYSTATIN 100000 UNIT/GM EX POWD
Freq: Three times a day (TID) | CUTANEOUS | Status: DC
  Filled 2024-10-12: qty 15

## 2024-10-12 MED ORDER — DOCUSATE SODIUM 100 MG PO CAPS
100.0000 mg | ORAL_CAPSULE | Freq: Two times a day (BID) | ORAL | Status: DC
  Administered 2024-10-12 – 2024-10-16 (×8): 100 mg via ORAL
  Filled 2024-10-12 (×8): qty 1

## 2024-10-12 MED ORDER — FENTANYL CITRATE (PF) 50 MCG/ML IJ SOSY
50.0000 ug | PREFILLED_SYRINGE | Freq: Once | INTRAMUSCULAR | Status: AC
  Administered 2024-10-12: 100 ug via INTRAVENOUS
  Filled 2024-10-12: qty 2

## 2024-10-12 MED ORDER — DEXAMETHASONE SOD PHOSPHATE PF 10 MG/ML IJ SOLN
INTRAMUSCULAR | Status: DC | PRN
  Administered 2024-10-12: 10 mg via INTRAVENOUS

## 2024-10-12 MED ORDER — ALUM & MAG HYDROXIDE-SIMETH 200-200-20 MG/5ML PO SUSP
30.0000 mL | ORAL | Status: DC | PRN
Start: 2024-10-12 — End: 2024-10-16

## 2024-10-12 MED ORDER — VANCOMYCIN HCL IN DEXTROSE 1-5 GM/200ML-% IV SOLN
INTRAVENOUS | Status: AC
  Filled 2024-10-12: qty 200

## 2024-10-12 MED ORDER — SODIUM CHLORIDE (PF) 0.9 % IJ SOLN
INTRAMUSCULAR | Status: DC | PRN
  Administered 2024-10-12: 61 mL

## 2024-10-12 MED ORDER — ALPRAZOLAM 0.5 MG PO TABS
1.0000 mg | ORAL_TABLET | Freq: Four times a day (QID) | ORAL | Status: DC | PRN
  Administered 2024-10-12 – 2024-10-16 (×4): 1 mg via ORAL
  Filled 2024-10-12 (×4): qty 2

## 2024-10-12 MED ORDER — DULOXETINE HCL 60 MG PO CPEP
60.0000 mg | ORAL_CAPSULE | Freq: Every day | ORAL | Status: DC
  Administered 2024-10-12 – 2024-10-15 (×4): 60 mg via ORAL
  Filled 2024-10-12 (×4): qty 1

## 2024-10-12 MED ORDER — DIPHENHYDRAMINE HCL 12.5 MG/5ML PO ELIX
12.5000 mg | ORAL_SOLUTION | ORAL | Status: DC | PRN
  Administered 2024-10-12 – 2024-10-14 (×4): 25 mg via ORAL
  Filled 2024-10-12 (×5): qty 10

## 2024-10-12 MED ORDER — 0.9 % SODIUM CHLORIDE (POUR BTL) OPTIME
TOPICAL | Status: DC | PRN
  Administered 2024-10-12: 1000 mL

## 2024-10-12 MED ORDER — ALBUTEROL SULFATE HFA 108 (90 BASE) MCG/ACT IN AERS: 2.0000 | INHALATION_SPRAY | Freq: Four times a day (QID) | RESPIRATORY_TRACT | Status: DC | PRN

## 2024-10-12 MED ORDER — ACETAMINOPHEN 500 MG PO TABS
1000.0000 mg | ORAL_TABLET | Freq: Once | ORAL | Status: AC
  Administered 2024-10-12: 1000 mg via ORAL
  Filled 2024-10-12: qty 2

## 2024-10-12 MED ORDER — TRANEXAMIC ACID-NACL 1000-0.7 MG/100ML-% IV SOLN
1000.0000 mg | INTRAVENOUS | Status: AC
  Administered 2024-10-12: 1000 mg via INTRAVENOUS
  Filled 2024-10-12: qty 100

## 2024-10-12 MED ORDER — LIDOCAINE HCL (PF) 2 % IJ SOLN
INTRAMUSCULAR | Status: AC
  Filled 2024-10-12: qty 5

## 2024-10-12 MED ORDER — CEFAZOLIN SODIUM-DEXTROSE 2-4 GM/100ML-% IV SOLN
2.0000 g | Freq: Four times a day (QID) | INTRAVENOUS | Status: AC
  Administered 2024-10-12 – 2024-10-13 (×2): 2 g via INTRAVENOUS
  Filled 2024-10-12 (×2): qty 100

## 2024-10-12 MED ORDER — NALOXONE HCL 0.4 MG/ML IJ SOLN: 0.4000 mg | INTRAMUSCULAR | Status: DC | PRN

## 2024-10-12 SURGICAL SUPPLY — 59 items
BAG COUNTER SPONGE SURGICOUNT (BAG) IMPLANT
BAG ZIPLOCK 12X15 (MISCELLANEOUS) ×2 IMPLANT
BATTERY INSTRU NAVIGATION (MISCELLANEOUS) ×6 IMPLANT
BLADE SAGITTAL AGGR TOOTH XLG (BLADE) ×2 IMPLANT
BLADE SAW RECIPROCATING 77.5 (BLADE) ×2 IMPLANT
BLADE SAW SAG 35X64 .89 (BLADE) ×2 IMPLANT
BNDG ELASTIC 4INX 5YD STR LF (GAUZE/BANDAGES/DRESSINGS) ×2 IMPLANT
BNDG ELASTIC 6INX 5YD STR LF (GAUZE/BANDAGES/DRESSINGS) ×2 IMPLANT
CHLORAPREP W/TINT 26 (MISCELLANEOUS) ×4 IMPLANT
COMPONENT FEM PS KNEE NRW 7 RT (Joint) IMPLANT
COMPONENT PATELLA 3 PEG 35 (Joint) IMPLANT
COVER SURGICAL LIGHT HANDLE (MISCELLANEOUS) ×2 IMPLANT
DERMABOND ADVANCED .7 DNX12 (GAUZE/BANDAGES/DRESSINGS) ×4 IMPLANT
DRAPE SHEET LG 3/4 BI-LAMINATE (DRAPES) ×6 IMPLANT
DRAPE U-SHAPE 47X51 STRL (DRAPES) ×2 IMPLANT
DRSG AQUACEL AG ADV 3.5X10 (GAUZE/BANDAGES/DRESSINGS) ×2 IMPLANT
ELECT BLADE TIP CTD 4 INCH (ELECTRODE) ×2 IMPLANT
ELECT PENCIL ROCKER SW 15FT (MISCELLANEOUS) ×2 IMPLANT
ELECT REM PT RETURN 15FT ADLT (MISCELLANEOUS) ×2 IMPLANT
GAUZE SPONGE 4X4 12PLY STRL (GAUZE/BANDAGES/DRESSINGS) ×2 IMPLANT
GLOVE BIO SURGEON STRL SZ7 (GLOVE) ×2 IMPLANT
GLOVE BIO SURGEON STRL SZ8.5 (GLOVE) ×4 IMPLANT
GLOVE BIOGEL PI IND STRL 7.5 (GLOVE) ×2 IMPLANT
GLOVE BIOGEL PI IND STRL 8.5 (GLOVE) ×2 IMPLANT
GOWN SPEC L3 XXLG W/TWL (GOWN DISPOSABLE) ×2 IMPLANT
GOWN STRL REUS W/ TWL XL LVL3 (GOWN DISPOSABLE) ×2 IMPLANT
HOLDER FOLEY CATH W/STRAP (MISCELLANEOUS) ×2 IMPLANT
HOOD PEEL AWAY T7 (MISCELLANEOUS) ×6 IMPLANT
KIT TURNOVER KIT A (KITS) ×2 IMPLANT
LINER TIB ASF PS CD/3-9 10 RT (Liner) IMPLANT
LINER TIB ASF PS CD/3-9 11 RT (Liner) IMPLANT
MARKER SKIN DUAL TIP RULER LAB (MISCELLANEOUS) ×2 IMPLANT
NDL SAFETY ECLIPSE 18X1.5 (NEEDLE) ×2 IMPLANT
NDL SPNL 18GX3.5 QUINCKE PK (NEEDLE) ×2 IMPLANT
NEEDLE SPNL 18GX3.5 QUINCKE PK (NEEDLE) ×1 IMPLANT
NS IRRIG 1000ML POUR BTL (IV SOLUTION) ×2 IMPLANT
PACK TOTAL KNEE CUSTOM (KITS) ×2 IMPLANT
PADDING CAST COTTON 6X4 STRL (CAST SUPPLIES) ×2 IMPLANT
PIN DRILL HDLS TROCAR 75 4PK (PIN) IMPLANT
PROTECTOR NERVE ULNAR (MISCELLANEOUS) ×2 IMPLANT
SCREW FEMALE HEX FIX 25X2.5 (ORTHOPEDIC DISPOSABLE SUPPLIES) IMPLANT
SEALER BIPOLAR AQUA 6.0 (INSTRUMENTS) ×2 IMPLANT
SET HNDPC FAN SPRY TIP SCT (DISPOSABLE) ×2 IMPLANT
SET PAD KNEE POSITIONER (MISCELLANEOUS) ×2 IMPLANT
SOLUTION PRONTOSAN WOUND 350ML (IRRIGATION / IRRIGATOR) ×2 IMPLANT
SPIKE FLUID TRANSFER (MISCELLANEOUS) ×4 IMPLANT
STAPLER SKIN PROX 35W (STAPLE) IMPLANT
STEM TIB PS KNEE D 0D RT (Stem) IMPLANT
SUT MNCRL AB 3-0 PS2 18 (SUTURE) ×2 IMPLANT
SUT MON AB 2-0 CT1 36 (SUTURE) ×2 IMPLANT
SUT STRATAFIX 14 PDO 48 VLT (SUTURE) ×2 IMPLANT
SUT VIC AB 1 CTX36XBRD ANBCTR (SUTURE) ×4 IMPLANT
SUT VIC AB 2-0 CT1 TAPERPNT 27 (SUTURE) ×2 IMPLANT
SYR 3ML LL SCALE MARK (SYRINGE) ×2 IMPLANT
TOWEL GREEN STERILE FF (TOWEL DISPOSABLE) ×2 IMPLANT
TRAY FOLEY MTR SLVR 16FR STAT (SET/KITS/TRAYS/PACK) ×2 IMPLANT
TUBE SUCTION HIGH CAP CLEAR NV (SUCTIONS) ×2 IMPLANT
WATER STERILE IRR 1000ML POUR (IV SOLUTION) ×4 IMPLANT
WRAP KNEE MAXI GEL POST OP (GAUZE/BANDAGES/DRESSINGS) ×2 IMPLANT

## 2024-10-12 NOTE — Progress Notes (Signed)
   10/12/24 2151  BiPAP/CPAP/SIPAP  BiPAP/CPAP/SIPAP Pt Type Adult  Reason BIPAP/CPAP not in use Non-compliant

## 2024-10-12 NOTE — Interval H&P Note (Signed)
 History and Physical Interval Note:  10/12/2024 11:00 AM  Norma Anthony  has presented today for surgery, with the diagnosis of Right knee osteoarthritis.  The various methods of treatment have been discussed with the patient and family. After consideration of risks, benefits and other options for treatment, the patient has consented to  Procedure(s): ARTHROPLASTY, KNEE, TOTAL, USING IMAGELESS COMPUTER-ASSISTED NAVIGATION (Right) as a surgical intervention.  The patient's history has been reviewed, patient examined, no change in status, stable for surgery.  I have reviewed the patient's chart and labs.  Questions were answered to the patient's satisfaction.     [name] PARAS Analisse Randle

## 2024-10-12 NOTE — Op Note (Signed)
 OPERATIVE REPORT  SURGEON: [name] Shoals, MD   ASSISTANT: Valery Potters, PA-C  PREOPERATIVE DIAGNOSIS: Primary Right knee arthritis.   POSTOPERATIVE DIAGNOSIS: Primary Right knee arthritis.   PROCEDURE: Computer assisted Right total knee arthroplasty.   IMPLANTS: Zimmer Persona PPS Cementless CR femur, size 7 Narrow. Persona 0 degree Spiked Keel OsseoTi Tibia, size D. Vivacit-E polyethelyene insert, size 11 mm, CR. OsseoTi 3-Peg patella, size 35 mm.  ANESTHESIA:  General and Regional  TOURNIQUET TIME: Not utilized.   ESTIMATED BLOOD LOSS: 250 mL.    ANTIBIOTICS: 2g Ancef . 1g Vancomycin  (high risk patient).  DRAINS: None.  COMPLICATIONS: None   CONDITION: PACU - hemodynamically stable.   BRIEF CLINICAL NOTE: Norma Anthony is a 68 y.o. female with a long-standing history of Right knee arthritis. After failing conservative management, the patient was indicated for total knee arthroplasty. The risks, benefits, and alternatives to the procedure were explained, and the patient elected to proceed.  PROCEDURE IN DETAIL: Adductor canal block was obtained in the pre-op holding area. Once inside the operative room, spinal anesthesia was obtained, and a foley catheter was inserted. The patient was then positioned and the lower extremity was prepped and draped in the normal sterile surgical fashion.  A time-out was called verifying side and site of surgery. The patient received IV antibiotics within 60 minutes of beginning the procedure. A tourniquet was not utilized.   An anterior approach to the knee was performed utilizing a midvastus arthrotomy. A medial release was performed and the patellar fat pad was excised. Stryker imageless navigation was used to cut the distal femur perpendicular to the mechanical axis. A freehand patellar resection was performed, and the patella was sized an prepared with 3 lug holes.  Nagivation was used to make a neutral proximal tibia resection, taking 5 mm of  bone from the less affected medial side with 3 degrees of slope. The menisci were excised. A spacer block was placed, and the alignment and balance in extension were confirmed.   The distal femur was sized using the 3-degree external rotation guide referencing the posterior femoral cortex. The appropriate 4-in-1 cutting block was pinned into place. Rotation was checked using Whiteside's line, the epicondylar axis, and then confirmed with a spacer block in flexion. The remaining femoral cuts were performed, taking care to protect the MCL.  The tibia was sized and the trial tray was pinned into place. The remaining trail components were inserted. The knee was stable to varus and valgus stress through a full range of motion. The patella tracked centrally, and the PCL was well balanced. The trial components were removed, and the proximal tibial surface was prepared. Final components were impacted into place. The knee was tested for a final time and found to be well balanced.   The wound was copiously irrigated with Prontosan solution and normal saline using pulse lavage.  Marcaine  solution was injected into the periarticular soft tissue.  The wound was closed in layers using #1 Vicryl and Stratafix for the fascia, 2-0 Vicryl for the subcutaneous fat, 2-0 Monocryl for the deep dermal layer, and skin staples.  Dermabond was applied to the skin.  Once the glue was fully dried, an Aquacell Ag and compressive dressing were applied.  The patient was transported to the recovery room in stable condition.  Sponge, needle, and instrument counts were correct at the end of the case x2.  The patient tolerated the procedure well and there were no known complications.  Please note that a surgical assistant  was a medical necessity for this procedure in order to perform it in a safe and expeditious manner. Surgical assistant was necessary to retract the ligaments and vital neurovascular structures to prevent injury to them and  also necessary for proper positioning of the limb to allow for anatomic placement of the prosthesis.

## 2024-10-12 NOTE — Transfer of Care (Signed)
 Immediate Anesthesia Transfer of Care Note  Patient: Norma Anthony  Procedure(s) Performed: ARTHROPLASTY, KNEE, TOTAL, USING IMAGELESS COMPUTER-ASSISTED NAVIGATION (Right: Knee)  Patient Location: PACU  Anesthesia Type:General  Level of Consciousness: drowsy  Airway & Oxygen Therapy: Patient Spontanous Breathing and Patient connected to face mask  Post-op Assessment: Report given to RN and Post -op Vital signs reviewed and stable  Post vital signs: Reviewed and stable  Last Vitals:  Vitals Value Taken Time  BP 179/112 10/12/24 15:17  Temp    Pulse 93 10/12/24 15:21  Resp 10 10/12/24 15:21  SpO2 99 % 10/12/24 15:21  Vitals shown include unfiled device data.  Last Pain:  Vitals:   10/12/24 0858  TempSrc: Oral  PainSc: 7          Complications: No notable events documented.

## 2024-10-12 NOTE — Anesthesia Procedure Notes (Signed)
 Anesthesia Regional Block: Adductor canal block   Pre-Anesthetic Checklist: , timeout performed,  Correct Patient, Correct Site, Correct Laterality,  Correct Procedure, Correct Position, site marked,  Risks and benefits discussed,  Pre-op evaluation,  At surgeon's request and post-op pain management  Laterality: Right  Prep: Maximum Sterile Barrier Precautions used, chloraprep       Needles:  Injection technique: Single-shot  Needle Type: Echogenic Stimulator Needle     Needle Length: 9cm  Needle Gauge: 21     Additional Needles:   Procedures:,,,, ultrasound used (permanent image in chart),,    Narrative:  Start time: 10/12/2024 10:14 AM End time: 10/12/2024 10:24 AM Injection made incrementally with aspirations every 5 mL.  Performed by: Personally  Anesthesiologist: Epifanio Fallow, MD  Additional Notes:

## 2024-10-12 NOTE — Anesthesia Procedure Notes (Signed)
 Procedure Name: Intubation Date/Time: 10/12/2024 12:18 PM  Performed by: Alesi Zachery, Corean BROCKS, CRNAPre-anesthesia Checklist: Patient identified, Emergency Drugs available, Suction available and Patient being monitored Patient Re-evaluated:Patient Re-evaluated prior to induction Oxygen Delivery Method: Circle system utilized Preoxygenation: Pre-oxygenation with 100% oxygen Induction Type: IV induction Ventilation: Mask ventilation without difficulty Laryngoscope Size: Mac and 3 Grade View: Grade I Tube type: Oral Tube size: 7.0 mm Number of attempts: 1 Airway Equipment and Method: Stylet and Oral airway Placement Confirmation: ETT inserted through vocal cords under direct vision, positive ETCO2 and breath sounds checked- equal and bilateral Secured at: 20 cm Tube secured with: Tape Dental Injury: Teeth and Oropharynx as per pre-operative assessment

## 2024-10-12 NOTE — Anesthesia Postprocedure Evaluation (Signed)
 Anesthesia Post Note  Patient: Norma Anthony  Procedure(s) Performed: ARTHROPLASTY, KNEE, TOTAL, USING IMAGELESS COMPUTER-ASSISTED NAVIGATION (Right: Knee)     Patient location during evaluation: PACU Anesthesia Type: General and Regional Level of consciousness: awake and alert Pain management: pain level controlled Vital Signs Assessment: post-procedure vital signs reviewed and stable Respiratory status: spontaneous breathing, nonlabored ventilation, respiratory function stable and patient connected to nasal cannula oxygen Cardiovascular status: blood pressure returned to baseline and stable Postop Assessment: no apparent nausea or vomiting Anesthetic complications: no   No notable events documented.  Last Vitals:  Vitals:   10/12/24 1645 10/12/24 1700  BP: (!) 187/84 (!) 168/105  Pulse: 97 97  Resp: 14 (!) 21  Temp: 36.6 C   SpO2: 93% 94%    Last Pain:  Vitals:   10/12/24 1630  TempSrc:   PainSc: 4                  Shakaria Raphael,W. EDMOND

## 2024-10-12 NOTE — Discharge Instructions (Signed)

## 2024-10-13 ENCOUNTER — Encounter (HOSPITAL_COMMUNITY): Payer: Self-pay | Admitting: Orthopedic Surgery

## 2024-10-13 DIAGNOSIS — M1711 Unilateral primary osteoarthritis, right knee: Secondary | ICD-10-CM | POA: Diagnosis not present

## 2024-10-13 LAB — CBC
HCT: 41 % (ref 36.0–46.0)
Hemoglobin: 12.2 g/dL (ref 12.0–15.0)
MCH: 30.7 pg (ref 26.0–34.0)
MCHC: 29.8 g/dL — ABNORMAL LOW (ref 30.0–36.0)
MCV: 103 fL — ABNORMAL HIGH (ref 80.0–100.0)
Platelets: 162 K/uL (ref 150–400)
RBC: 3.98 MIL/uL (ref 3.87–5.11)
RDW: 12.5 % (ref 11.5–15.5)
WBC: 12.1 K/uL — ABNORMAL HIGH (ref 4.0–10.5)
nRBC: 0 % (ref 0.0–0.2)

## 2024-10-13 LAB — BASIC METABOLIC PANEL WITH GFR
Anion gap: 12 (ref 5–15)
BUN: 9 mg/dL (ref 8–23)
CO2: 24 mmol/L (ref 22–32)
Calcium: 8.4 mg/dL — ABNORMAL LOW (ref 8.9–10.3)
Chloride: 106 mmol/L (ref 98–111)
Creatinine, Ser: 0.81 mg/dL (ref 0.44–1.00)
GFR, Estimated: >60 mL/min (ref >60)
Glucose, Bld: 186 mg/dL — ABNORMAL HIGH (ref 70–99)
Potassium: 4.2 mmol/L (ref 3.5–5.1)
Sodium: 142 mmol/L (ref 135–145)

## 2024-10-13 NOTE — Progress Notes (Signed)
   10/13/24 2140  BiPAP/CPAP/SIPAP  BiPAP/CPAP/SIPAP Pt Type Adult  Reason BIPAP/CPAP not in use Non-compliant (Pt refuse)

## 2024-10-13 NOTE — Plan of Care (Signed)
  Problem: Education: Goal: Knowledge of General Education information will improve Description: Including pain rating scale, medication(s)/side effects and non-pharmacologic comfort measures Outcome: Progressing   Problem: Health Behavior/Discharge Planning: Goal: Ability to manage health-related needs will improve Outcome: Progressing   Problem: Clinical Measurements: Goal: Ability to maintain clinical measurements within normal limits will improve Outcome: Progressing Goal: Will remain free from infection Outcome: Progressing Goal: Diagnostic test results will improve Outcome: Progressing Goal: Respiratory complications will improve Outcome: Progressing Goal: Cardiovascular complication will be avoided Outcome: Progressing   Problem: Activity: Goal: Risk for activity intolerance will decrease Outcome: Progressing   Problem: Nutrition: Goal: Adequate nutrition will be maintained Outcome: Progressing   Problem: Coping: Goal: Level of anxiety will decrease Outcome: Progressing   Problem: Elimination: Goal: Will not experience complications related to bowel motility Outcome: Progressing Goal: Will not experience complications related to urinary retention Outcome: Progressing   Problem: Pain Managment: Goal: General experience of comfort will improve and/or be controlled Outcome: Progressing   Problem: Safety: Goal: Ability to remain free from injury will improve Outcome: Progressing   Problem: Skin Integrity: Goal: Risk for impaired skin integrity will decrease Outcome: Progressing   Problem: Education: Goal: Knowledge of the prescribed therapeutic regimen will improve Outcome: Progressing Goal: Individualized Educational Video(s) Outcome: Completed/Met   Problem: Activity: Goal: Ability to avoid complications of mobility impairment will improve Outcome: Progressing Goal: Range of joint motion will improve Outcome: Progressing   Problem: Clinical  Measurements: Goal: Postoperative complications will be avoided or minimized Outcome: Progressing   Problem: Pain Management: Goal: Pain level will decrease with appropriate interventions Outcome: Progressing   Problem: Skin Integrity: Goal: Will show signs of wound healing Outcome: Progressing

## 2024-10-13 NOTE — Evaluation (Signed)
 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  Pt should progress to dc home with family assist and reports first OP PT scheduled for 10/16/24.        If plan is discharge home, recommend the following: A little help with walking and/or transfers;A little help with bathing/dressing/bathroom;Assistance with cooking/housework;Assist for transportation;Help with stairs or ramp for entrance   Can travel by private vehicle        Equipment Recommendations None recommended by PT  Recommendations for Other Services       Functional Status Assessment Patient has had a recent decline in their functional status and demonstrates the ability to make significant improvements in function in a reasonable and predictable amount of time.     Precautions / Restrictions Precautions Precautions: Knee;Fall Restrictions Weight Bearing Restrictions Per Provider Order: No Other Position/Activity Restrictions: WBAT      Mobility  Bed Mobility Overal bed mobility: Needs Assistance Bed Mobility: Supine to Sit     Supine to sit: Contact guard     General bed mobility comments: use of bed rail and CGA for R LE safety    Transfers Overall transfer level: Needs assistance Equipment used: Rolling walker (2 wheels) Transfers: Sit to/from Stand Sit to Stand: Min assist           General transfer comment: cues for LE management and use of UEs to self assist     Ambulation/Gait Ambulation/Gait assistance: Min assist Gait Distance (Feet): 43 Feet Assistive device: Rolling walker (2 wheels) Gait Pattern/deviations: Step-to pattern, Decreased step length - right, Decreased step length - left, Shuffle, Trunk flexed Gait velocity: decr     General Gait Details: cues for sequence, posture and position from RW; distance ltd by fatigue  Stairs            Wheelchair Mobility     Tilt Bed    Modified Rankin (Stroke Patients Only)       Balance Overall balance assessment: Needs assistance Sitting-balance support: No upper extremity supported, Feet supported Sitting balance-Leahy Scale: Good     Standing balance support: Bilateral upper extremity supported Standing balance-Leahy Scale: Poor                               Pertinent Vitals/Pain Pain Assessment Pain Assessment: 0-10 Pain Score: 5  Pain Location: R knee Pain Descriptors / Indicators: Aching, Sore Pain Intervention(s): Limited activity within patient's tolerance, Monitored during session, Premedicated before session, Ice applied    Home Living Family/patient expects to be discharged to:: Private residence Living Arrangements: Spouse/significant other Available Help at Discharge: Family;Available 24 hours/day Type of Home: House Home Access: Stairs to enter Entrance Stairs-Rails: None Entrance Stairs-Number of Steps: 2   Home Layout: One level Home Equipment: Agricultural Consultant (2 wheels);Cane - single point;Wheelchair Financial Trader (4 wheels);BSC/3in1;Tub bench;Grab bars - tub/shower Additional Comments: Purewick    Prior Function Prior Level of Function : History of Falls (last six  months);Needs assist             Mobility Comments: Pt had 5 surgeries on L knee from 2023-2024 , L knee progressed and doing better but then R knee started buckling, painful, and giving out in dec of 2024.  Since Dec of 2024 she has had multiple falls and limited  mobility.  Husband has been pulling her up steps in w/c.  She has been ambulating in home some but also uses w/c if needed.  Walks with RW. ADLs Comments: Spouse assisting with lower body adls.     Extremity/Trunk Assessment   Upper Extremity Assessment Upper Extremity Assessment: Overall WFL for tasks assessed    Lower Extremity Assessment Lower Extremity Assessment: RLE deficits/detail RLE Deficits / Details: IND SLR with AAROM at knee to 60 degrees       Communication   Communication Communication: No apparent difficulties    Cognition Arousal: Alert Behavior During Therapy: Anxious   PT - Cognitive impairments:  (talkative with tangential thinking)                         Following commands: Intact       Cueing Cueing Techniques: Verbal cues, Gestural cues     General Comments      Exercises Total Joint Exercises Ankle Circles/Pumps: AROM, Both, 15 reps, Supine Quad Sets: AROM, Both, 10 reps, Supine Heel Slides: AAROM, Right, 15 reps, Supine Straight Leg Raises: AAROM, AROM, Right, 10 reps, Supine   Assessment/Plan    PT Assessment Patient needs continued PT services  PT Problem List Decreased strength;Decreased activity tolerance;Decreased range of motion;Decreased balance;Decreased knowledge of use of DME;Pain;Obesity       PT Treatment Interventions Gait training;DME instruction;Stair training;Functional mobility training;Therapeutic activities;Therapeutic exercise;Patient/family education    PT Goals (Current goals can be found in the Care Plan section)  Acute Rehab PT Goals Patient Stated Goal: Regain IND to go on trips with spouse PT Goal Formulation: With patient Time For Goal Achievement: 10/27/24 Potential to Achieve Goals: Good    Frequency 7X/week     Co-evaluation               AM-PAC PT 6 Clicks Mobility  Outcome Measure Help needed turning from your back to your side while in a flat bed without using bedrails?: A  Little Help needed moving from lying on your back to sitting on the side of a flat bed without using bedrails?: A Little Help needed moving to and from a bed to a chair (including a wheelchair)?: A Little Help needed standing up from a chair using your arms (e.g., wheelchair or bedside chair)?: A Little Help needed to walk in hospital room?: A Little Help needed climbing 3-5 steps with a railing? : A Lot 6 Click Score: 17    End of Session Equipment Utilized During Treatment: Gait belt Activity Tolerance: Patient tolerated treatment well;Patient limited by fatigue Patient left: in chair;with call bell/phone within reach;with chair alarm set;with family/visitor present Nurse Communication: Mobility status PT Visit Diagnosis: Difficulty in walking, not elsewhere classified (R26.2)    Time: 8941-8871 PT Time Calculation (min) (ACUTE ONLY): 30 min   Charges:   PT Evaluation $PT Eval Low Complexity: 1 Low PT Treatments $Therapeutic Exercise: 8-22 mins PT General Charges $$ ACUTE PT VISIT: 1 Visit         [facility] PT Acute Rehabilitation Services Office [phone]   Seldon Barrell 10/13/2024, 3:10 PM

## 2024-10-13 NOTE — Progress Notes (Signed)
    Subjective:  Patient reports pain as mild to moderate.  Denies N/V/CP/SOB/Abd pain. She reports she is doing well this morning. She reports difficulty sleeping.  Incentive spirometry at bedside.  Catheter still in place to be removed today.  Denies tingling or numbness in LE bilaterally.  She is hopeful for d/c home today.   Objective:   VITALS:   Vitals:   10/12/24 1746 10/12/24 1900 10/13/24 0125 10/13/24 0542  BP: (!) 134/93 (!) 151/99 (!) 141/76 137/65  Pulse: 93 99 100 96  Resp: 18 15 16 16   Temp: 98.1 F (36.7 C) 98 F (36.7 C) 99.4 F (37.4 C) 99.3 F (37.4 C)  TempSrc: Axillary Oral Oral Oral  SpO2: 97% 99% 94% 94%  Weight:      Height:        NAD Neurologically intact ABD soft Neurovascular intact Sensation intact distally Intact pulses distally Dorsiflexion/Plantar flexion intact Incision: dressing C/D/I No cellulitis present Compartment soft   Lab Results  Component Value Date   WBC 12.1 (H) 10/13/2024   HGB 12.2 10/13/2024   HCT 41.0 10/13/2024   MCV 103.0 (H) 10/13/2024   PLT 162 10/13/2024   BMET    Component Value Date/Time   NA 142 10/13/2024 0350   K 4.2 10/13/2024 0350   CL 106 10/13/2024 0350   CO2 24 10/13/2024 0350   GLUCOSE 186 (H) 10/13/2024 0350   BUN 9 10/13/2024 0350   CREATININE 0.81 10/13/2024 0350   CREATININE 0.64 08/07/2024 1303   CALCIUM 8.4 (L) 10/13/2024 0350   EGFR 101.0 04/13/2023 1511   GFRNONAA >60 10/13/2024 0350   GFRNONAA >60 08/07/2024 1303     Assessment/Plan: 1 Day Post-Op   Principal Problem:   S/P total knee arthroplasty, right   WBAT with walker DVT ppx: Xarelto , SCDs, TEDS PO pain control PT/OT: To come today.  Dispo:  - D/c home with OPPT once cleared with PT and voided.    Norma Anthony 10/13/2024, 8:30 AM   [facility]  [place] [address], [address], [place], [place] [address] Phone: [phone] [email or web address] Facebook  [employer]

## 2024-10-13 NOTE — Progress Notes (Signed)
 Physical Therapy Treatment Patient Details Name: Norma Anthony MRN: [id number] DOB: 02-Sep-1956 Today's Date: 10/13/2024   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 , obesity, OSA, GERD, fibromyalgia, arthritis who had left TKA in the past followed by MSSA infection requiring multiple restaged surgeries    PT Comments  Pt continues cooperative and initially progressing well this session with mobility.  Pt up to [facility] for toileting and able to balance in standing sans AD.  Pt up to ambulate in hall but distance ltd by pt c/o SOB and need to return to sitting - pt very pale/diaphoretic with O2 sat 96%, BP 157/93 and HR tachy and in 150s.  RN alerted and assessing pt.  Pt states still hopeful for dc home this date.    If plan is discharge home, recommend the following: A little help with walking and/or transfers;A little help with bathing/dressing/bathroom;Assistance with cooking/housework;Assist for transportation;Help with stairs or ramp for entrance   Can travel by private vehicle        Equipment Recommendations  None recommended by PT    Recommendations for Other Services       Precautions / Restrictions Precautions Precautions: Knee;Fall Restrictions Weight Bearing Restrictions Per Provider Order: No Other Position/Activity Restrictions: WBAT     Mobility  Bed Mobility Overal bed mobility: Needs Assistance Bed Mobility: Supine to Sit     Supine to sit: Contact guard     General bed mobility comments: Pt up in chair and return to same    Transfers Overall transfer level: Needs assistance Equipment used: Rolling walker (2 wheels) Transfers: Sit to/from Stand, Bed to chair/wheelchair/BSC Sit to Stand: Contact guard assist   Step pivot transfers: Contact guard assist       General transfer comment: cues for LE management and use of UEs to self assist; step pvt  recliner to [facility]    Ambulation/Gait Ambulation/Gait assistance: Contact guard assist Gait Distance (Feet): 26 Feet Assistive device: Rolling walker (2 wheels) Gait Pattern/deviations: Step-to pattern, Decreased step length - right, Decreased step length - left, Shuffle, Trunk flexed Gait velocity: decr     General Gait Details: min cues for sequence, posture and position from RW; distance ltd by fatigue/SOB   Stairs Stairs:  (Stairs deferred - pt and spouse report plan to bump up in [facility])           Wheelchair Mobility     Tilt Bed    Modified Rankin (Stroke Patients Only)       Balance Overall balance assessment: Needs assistance Sitting-balance support: No upper extremity supported, Feet supported Sitting balance-Leahy Scale: Good     Standing balance support: Bilateral upper extremity supported Standing balance-Leahy Scale: Poor                              Communication Communication Communication: No apparent difficulties  Cognition Arousal: Alert Behavior During Therapy: Anxious   PT - Cognitive impairments:  (talkative with tangential thinking)                         Following commands: Intact      Cueing Cueing Techniques: Verbal cues, Gestural cues  Exercises Total Joint Exercises Ankle Circles/Pumps: AROM, Both, 15 reps, Supine Quad Sets: AROM, Both, 10 reps, Supine Heel Slides: AAROM, Right, 15  reps, Supine Straight Leg Raises: AAROM, AROM, Right, 10 reps, Supine    General Comments        Pertinent Vitals/Pain Pain Assessment Pain Assessment: 0-10 Pain Score: 5  Pain Location: R knee Pain Descriptors / Indicators: Aching, Sore Pain Intervention(s): Limited activity within patient's tolerance    Home Living Family/patient expects to be discharged to:: Private residence Living Arrangements: Spouse/significant other Available Help at Discharge: Family;Available 24 hours/day Type of Home: House Home Access:  Stairs to enter Entrance Stairs-Rails: None Entrance Stairs-Number of Steps: 2   Home Layout: One level Home Equipment: Agricultural Consultant (2 wheels);Cane - single point;Wheelchair Financial Trader (4 wheels);BSC/3in1;Tub bench;Grab bars - tub/shower Additional Comments: Purewick    Prior Function            PT Goals (current goals can now be found in the care plan section) Acute Rehab PT Goals Patient Stated Goal: Regain IND to go on trips with spouse PT Goal Formulation: With patient Time For Goal Achievement: 10/27/24 Potential to Achieve Goals: Good Progress towards PT goals: Progressing toward goals    Frequency    7X/week      PT Plan      Co-evaluation              AM-PAC PT 6 Clicks Mobility   Outcome Measure  Help needed turning from your back to your side while in a flat bed without using bedrails?: A Little Help needed moving from lying on your back to sitting on the side of a flat bed without using bedrails?: A Little Help needed moving to and from a bed to a chair (including a wheelchair)?: A Little Help needed standing up from a chair using your arms (e.g., wheelchair or bedside chair)?: A Little Help needed to walk in hospital room?: A Little Help needed climbing 3-5 steps with a railing? : A Lot 6 Click Score: 17    End of Session Equipment Utilized During Treatment: Gait belt Activity Tolerance: Patient limited by fatigue;Other (comment) (SOB with exertion) Patient left: in chair;with call bell/phone within reach;with chair alarm set;with family/visitor present Nurse Communication: Mobility status PT Visit Diagnosis: Difficulty in walking, not elsewhere classified (R26.2)     Time: 8579-8550 PT Time Calculation (min) (ACUTE ONLY): 29 min  Charges:    $Gait Training: 8-22 mins $Therapeutic Exercise: 8-22 mins $Therapeutic Activity: 8-22 mins PT General Charges $$ ACUTE PT VISIT: 1 Visit                     [facility] PT Acute  Rehabilitation Services Office [phone]    Mariska Daffin 10/13/2024, 3:17 PM

## 2024-10-13 NOTE — TOC Transition Note (Signed)
 Transition of Care [facility]) - Discharge Note   Patient Details  Name: Norma Anthony MRN: [id number] Date of Birth: 1956/01/12  Transition of Care [facility]) CM/SW Contact:  Alfonse JONELLE Rex, RN Phone Number: 10/13/2024, 10:42 AM   Clinical Narrative:  Meet with patient at bedside to review dc therapy and home equipment needs, patient confirmed OPPT-EO [place], has RW. No TOC needs.      Final next level of care: OP Rehab Barriers to Discharge: No Barriers Identified   Patient Goals and CMS Choice Patient states their goals for this hospitalization and ongoing recovery are:: return home          Discharge Placement                       Discharge Plan and Services Additional resources added to the After Visit Summary for                                       Social Drivers of Health (SDOH) Interventions SDOH Screenings   Food Insecurity: No Food Insecurity (10/12/2024)  Housing: Unknown (10/12/2024)  Transportation Needs: No Transportation Needs (10/12/2024)  Utilities: Not At Risk (10/12/2024)  Depression (PHQ2-9): Low Risk  (08/07/2024)  Financial Resource Strain: Low Risk  (04/23/2021)   Received from [facility]  Social Connections: Unknown (10/12/2024)  Stress: Stress Concern Present (04/23/2021)   Received from [facility]  Tobacco Use: Medium Risk (10/12/2024)     Readmission Risk Interventions    08/12/2024    1:40 PM 06/05/2024   11:00 AM 03/30/2024    8:49 AM  Readmission Risk Prevention Plan  Transportation Screening Complete Complete Complete  Medication Review Oceanographer) Complete Complete Complete  PCP or Specialist appointment within 3-5 days of discharge Complete    HRI or Home Care Consult Complete Complete Complete  SW Recovery Care/Counseling Consult Complete Complete Complete  Palliative Care Screening Not Applicable Not Applicable Not Applicable  Skilled Nursing Facility Not Applicable Not Applicable Not Applicable

## 2024-10-13 NOTE — Progress Notes (Addendum)
 Subjective:  Patient reports pain as appropriately controlled. Denies any new numbness/tingling.   Objective:   VITALS:  Temp:  [98.5 F (36.9 C)-100.4 F (38 C)] 100.4 F (38 C) (11/01 0539) Pulse Rate:  [95-107] 100 (11/01 0539) Resp:  [14-17] 14 (11/01 0539) BP: (130-142)/(71-118) 136/73 (11/01 0539) SpO2:  [93 %-95 %] 94 % (11/01 0539)  Neurologically intact Neurovascular intact Sensation & motor grossly intact distally Intact pulses distally Compartment soft   LABS Recent Labs    10/13/24 0350 10/14/24 0745  HGB 12.2 11.7*  WBC 12.1* 10.5  PLT 162 142*   Recent Labs    10/13/24 0350  NA 142  K 4.2  CL 106  CO2 24  BUN 9  CREATININE 0.81  GLUCOSE 186*   No results for input(s): LABPT, INR in the last 72 hours.   Assessment/Plan: 2 Days Post-Op Procedure(s) (LRB): ARTHROPLASTY, KNEE, TOTAL, USING IMAGELESS COMPUTER-ASSISTED NAVIGATION (Right)  WBAT with walker DVT ppx: Xarelto , SCDs, TEDS PO pain control PT/OT: To come today Dispo:  - D/c home tomorrow with OPPT once cleared with PT and voided  Lillia Mountain 10/14/2024, 9:05 AM

## 2024-10-14 DIAGNOSIS — M1711 Unilateral primary osteoarthritis, right knee: Secondary | ICD-10-CM | POA: Diagnosis not present

## 2024-10-14 LAB — BASIC METABOLIC PANEL WITH GFR
Anion gap: 9 (ref 5–15)
BUN: 15 mg/dL (ref 8–23)
CO2: 28 mmol/L (ref 22–32)
Calcium: 8.6 mg/dL — ABNORMAL LOW (ref 8.9–10.3)
Chloride: 103 mmol/L (ref 98–111)
Creatinine, Ser: 0.65 mg/dL (ref 0.44–1.00)
GFR, Estimated: >60 mL/min (ref >60)
Glucose, Bld: 123 mg/dL — ABNORMAL HIGH (ref 70–99)
Potassium: 4.6 mmol/L (ref 3.5–5.1)
Sodium: 140 mmol/L (ref 135–145)

## 2024-10-14 LAB — CBC
HCT: 38.5 % (ref 36.0–46.0)
Hemoglobin: 11.7 g/dL — ABNORMAL LOW (ref 12.0–15.0)
MCH: 31.6 pg (ref 26.0–34.0)
MCHC: 30.4 g/dL (ref 30.0–36.0)
MCV: 104.1 fL — ABNORMAL HIGH (ref 80.0–100.0)
Platelets: 142 K/uL — ABNORMAL LOW (ref 150–400)
RBC: 3.7 MIL/uL — ABNORMAL LOW (ref 3.87–5.11)
RDW: 12.7 % (ref 11.5–15.5)
WBC: 10.5 K/uL (ref 4.0–10.5)
nRBC: 0 % (ref 0.0–0.2)

## 2024-10-14 NOTE — Progress Notes (Addendum)
 PT Cancellation Note  Patient Details Name: Norma Anthony MRN: [id number] DOB: [birth year]   Cancelled Treatment:     PT attempted x 3 but deferred initially at request of family and later by RN 2* pt continued lethargy.  Will follow in am.    [name] 10/14/2024, 2:41 PM

## 2024-10-14 NOTE — Plan of Care (Signed)

## 2024-10-14 NOTE — Progress Notes (Signed)
   10/14/24 2049  BiPAP/CPAP/SIPAP  BiPAP/CPAP/SIPAP Pt Type Adult  Reason BIPAP/CPAP not in use Non-compliant

## 2024-10-15 ENCOUNTER — Other Ambulatory Visit (HOSPITAL_COMMUNITY): Payer: Self-pay

## 2024-10-15 DIAGNOSIS — M1711 Unilateral primary osteoarthritis, right knee: Secondary | ICD-10-CM | POA: Diagnosis not present

## 2024-10-15 LAB — CBC
HCT: 39.8 % (ref 36.0–46.0)
Hemoglobin: 11.9 g/dL — ABNORMAL LOW (ref 12.0–15.0)
MCH: 30.3 pg (ref 26.0–34.0)
MCHC: 29.9 g/dL — ABNORMAL LOW (ref 30.0–36.0)
MCV: 101.3 fL — ABNORMAL HIGH (ref 80.0–100.0)
Platelets: 133 K/uL — ABNORMAL LOW (ref 150–400)
RBC: 3.93 MIL/uL (ref 3.87–5.11)
RDW: 12.6 % (ref 11.5–15.5)
WBC: 10.6 K/uL — ABNORMAL HIGH (ref 4.0–10.5)
nRBC: 0 % (ref 0.0–0.2)

## 2024-10-15 LAB — BASIC METABOLIC PANEL WITH GFR
Anion gap: 7 (ref 5–15)
BUN: 11 mg/dL (ref 8–23)
CO2: 29 mmol/L (ref 22–32)
Calcium: 8.5 mg/dL — ABNORMAL LOW (ref 8.9–10.3)
Chloride: 100 mmol/L (ref 98–111)
Creatinine, Ser: 0.52 mg/dL (ref 0.44–1.00)
GFR, Estimated: >60 mL/min (ref >60)
Glucose, Bld: 119 mg/dL — ABNORMAL HIGH (ref 70–99)
Potassium: 4 mmol/L (ref 3.5–5.1)
Sodium: 136 mmol/L (ref 135–145)

## 2024-10-15 MED ORDER — HYDROMORPHONE HCL 2 MG PO TABS
2.0000 mg | ORAL_TABLET | ORAL | 0 refills | Status: AC | PRN
  Filled 2024-10-15: qty 50, 5d supply, fill #0

## 2024-10-15 MED ORDER — SENNA 8.6 MG PO TABS
2.0000 | ORAL_TABLET | Freq: Every day | ORAL | 0 refills | Status: AC
  Filled 2024-10-15: qty 30, 15d supply, fill #0

## 2024-10-15 MED ORDER — NYSTATIN 100000 UNIT/GM EX POWD
Freq: Three times a day (TID) | CUTANEOUS | 1 refills | Status: AC
  Filled 2024-10-15: qty 60, 30d supply, fill #0

## 2024-10-15 MED ORDER — ONDANSETRON HCL 4 MG PO TABS
4.0000 mg | ORAL_TABLET | Freq: Three times a day (TID) | ORAL | 0 refills | Status: AC | PRN
  Filled 2024-10-15: qty 30, 10d supply, fill #0

## 2024-10-15 MED ORDER — DOCUSATE SODIUM 100 MG PO CAPS
100.0000 mg | ORAL_CAPSULE | Freq: Two times a day (BID) | ORAL | 0 refills | Status: AC
  Filled 2024-10-15: qty 60, 30d supply, fill #0

## 2024-10-15 NOTE — Progress Notes (Signed)
   10/15/24 2147  Assess: MEWS Score  Temp (!) 101.1 F (38.4 C)  BP (!) 150/80  MAP (mmHg) 96  Pulse Rate (!) 110  Resp 20  SpO2 93 %  O2 Device Room Air  Assess: MEWS Score  MEWS Temp 1  MEWS Systolic 0  MEWS Pulse 1  MEWS RR 0  MEWS LOC 0  MEWS Score 2  MEWS Score Color Yellow  Assess: if the MEWS score is Yellow or Red  Were vital signs accurate and taken at a resting state? Yes  Does the patient meet 2 or more of the SIRS criteria? Yes  Does the patient have a confirmed or suspected source of infection? No  MEWS guidelines implemented  Yes, yellow  Treat  MEWS Interventions Considered administering scheduled or prn medications/treatments as ordered  Take Vital Signs  Increase Vital Sign Frequency  Yellow: Q2hr x1, continue Q4hrs until patient remains green for 12hrs  Escalate  MEWS: Escalate Yellow: Discuss with charge nurse and consider notifying provider and/or RRT  Notify: Charge Nurse/RN  Name of Charge Nurse/RN Notified Trudy Maus, RN  Assess: SIRS CRITERIA  SIRS Temperature  1  SIRS Respirations  0  SIRS Pulse 1  SIRS WBC 0  SIRS Score Sum  2

## 2024-10-15 NOTE — Plan of Care (Signed)
  Problem: Health Behavior/Discharge Planning: Goal: Ability to manage health-related needs will improve Outcome: Progressing   Problem: Clinical Measurements: Goal: Ability to maintain clinical measurements within normal limits will improve Outcome: Progressing Goal: Will remain free from infection Outcome: Progressing Goal: Diagnostic test results will improve Outcome: Progressing Goal: Respiratory complications will improve Outcome: Progressing Goal: Cardiovascular complication will be avoided Outcome: Progressing   Problem: Activity: Goal: Risk for activity intolerance will decrease Outcome: Progressing   Problem: Coping: Goal: Level of anxiety will decrease Outcome: Progressing   Problem: Elimination: Goal: Will not experience complications related to bowel motility Outcome: Progressing Goal: Will not experience complications related to urinary retention Outcome: Progressing   Problem: Pain Managment: Goal: General experience of comfort will improve and/or be controlled Outcome: Progressing   Problem: Safety: Goal: Ability to remain free from injury will improve Outcome: Progressing   Problem: Skin Integrity: Goal: Risk for impaired skin integrity will decrease Outcome: Progressing   Problem: Education: Goal: Knowledge of the prescribed therapeutic regimen will improve Outcome: Progressing   Problem: Activity: Goal: Ability to avoid complications of mobility impairment will improve Outcome: Progressing Goal: Range of joint motion will improve Outcome: Progressing   Problem: Clinical Measurements: Goal: Postoperative complications will be avoided or minimized Outcome: Progressing   Problem: Pain Management: Goal: Pain level will decrease with appropriate interventions Outcome: Progressing   Problem: Skin Integrity: Goal: Will show signs of wound healing Outcome: Progressing

## 2024-10-15 NOTE — Discharge Summary (Signed)
 In most cases prophylactic antibiotics for Dental procdeures after total joint surgery are not necessary.  Exceptions are as follows:  1. History of prior total joint infection  2. Severely immunocompromised (Organ Transplant, cancer chemotherapy, Rheumatoid biologic meds such as Humera)  3. Poorly controlled diabetes (A1C &gt; 8.0, blood glucose over 200)  If you have one of these conditions, contact your surgeon for an antibiotic prescription, prior to your dental procedure. Orthopedic Discharge Summary        Physician Discharge Summary  Patient ID: Norma Anthony MRN: [id number] DOB/AGE: Nov 10, 1956 68 y.o.  Admit date: 10/12/2024 Discharge date: 10/15/2024   Procedures:  Procedure(s) (LRB): ARTHROPLASTY, KNEE, TOTAL, USING IMAGELESS COMPUTER-ASSISTED NAVIGATION (Right)  Attending Physician:  Dr. [name] Shoals  Admission Diagnoses:   Right knee end stage OA  Discharge Diagnoses:  same   Past Medical History:  Diagnosis Date   Acute respiratory failure with hypoxia (HCC) 03/23/2022   Anemia    Anxiety    Arthritis    CAD (coronary artery disease)    pt deneies   COPD (chronic obstructive pulmonary disease) (HCC)    Depression    DVT (deep venous thrombosis) (HCC)    Dyspnea    Fibromyalgia    Generalized anxiety disorder 12/11/2022   GERD (gastroesophageal reflux disease)    Hiatal hernia    History of kidney stones    Lung nodule 03/24/2022   Migraine headache    Multinodular goiter    Nephrolithiasis    Obstructive sleep apnea syndrome 07/01/2021   Opioid dependence (HCC) 03/23/2022   Pneumonia    Pulmonary embolism (HCC) 1996   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 Patient tolerated the  procedure well and brought to the recovery room in good condition and subsequently to the floor. Patient had an uncomplicated hospital course and was stable for discharge.   Disposition: Discharge disposition: 01-Home or Self Care      with follow up in 2 weeks    Follow-up Information     Leigh Valery RAMAN, PA-C. Schedule an appointment as soon as possible for a visit in 2 week(s).   Specialty: Orthopedic Surgery Why: For suture removal, For wound re-check Contact information: [address] [place] [address] [phone]                 Dental Antibiotics:  In most cases prophylactic antibiotics for Dental procdeures after total joint surgery are not necessary.  Exceptions are as follows:  1. History of prior total joint infection  2. Severely immunocompromised (Organ Transplant, cancer chemotherapy, Rheumatoid biologic meds such as Humera)  3. Poorly controlled diabetes (A1C &gt; 8.0, blood glucose over 200)  If you have one of these conditions, contact your surgeon for an antibiotic prescription, prior to your dental procedure.  Discharge Instructions     Call MD / Call 911   Complete by: As directed    If you experience chest pain or shortness of breath, CALL 911 and be transported to the hospital emergency room.  If you develope a fever above 101 F, pus (white drainage) or increased drainage or redness at the wound, or calf pain, call your surgeon's office.   Constipation Prevention  Complete by: As directed    Drink plenty of fluids.  Prune juice may be helpful.  You may use a stool softener, such as Colace (over the counter) 100 mg twice a day.  Use MiraLax  (over the counter) for constipation as needed.   Diet - low sodium heart healthy   Complete by: As directed    Increase activity slowly as tolerated   Complete by: As directed    Post-operative opioid taper instructions:   Complete by: As directed    POST-OPERATIVE OPIOID TAPER  INSTRUCTIONS: It is important to wean off of your opioid medication as soon as possible. If you do not need pain medication after your surgery it is ok to stop day one. Opioids include: Codeine, Hydrocodone (Norco, Vicodin), Oxycodone (Percocet, oxycontin ) and hydromorphone  amongst others.  Long term and even short term use of opiods can cause: Increased pain response Dependence Constipation Depression Respiratory depression And more.  Withdrawal symptoms can include Flu like symptoms Nausea, vomiting And more Techniques to manage these symptoms Hydrate well Eat regular healthy meals Stay active Use relaxation techniques(deep breathing, meditating, yoga) Do Not substitute Alcohol  to help with tapering If you have been on opioids for less than two weeks and do not have pain than it is ok to stop all together.  Plan to wean off of opioids This plan should start within one week post op of your joint replacement. Maintain the same interval or time between taking each dose and first decrease the dose.  Cut the total daily intake of opioids by one tablet each day Next start to increase the time between doses. The last dose that should be eliminated is the evening dose.          Allergies as of 10/15/2024       Reactions   Belbuca  [buprenorphine  Hcl] Other (See Comments)    Horrible headache    Lyrica [pregabalin] Other (See Comments)   Paralysis present (finding)- not tolerated The patient stated she could not move her body, but she could think.   Compazine  [prochlorperazine ] Anxiety   Droperidol Anxiety   Ergotamine-caffeine Anxiety, Other (See Comments), Hypertension   Increased systolic arterial pressure (finding)  (caffeine / ergotamine)        Medication List     TAKE these medications    albuterol  108 (90 Base) MCG/ACT inhaler Commonly known as: VENTOLIN  HFA Inhale 2 puffs into the lungs every 6 (six) hours as needed for wheezing or shortness of breath.    albuterol  (2.5 MG/3ML) 0.083% nebulizer solution Commonly known as: PROVENTIL  Take 2.5 mg by nebulization every 4 (four) hours as needed for wheezing or shortness of breath.   ALPRAZolam  1 MG tablet Commonly known as: XANAX  Take 1 tablet (1 mg total) by mouth every 6 (six) hours as needed for anxiety or sleep. What changed: how much to take   ascorbic acid  500 MG tablet Commonly known as: VITAMIN C  Take 500 mg by mouth daily.   b complex vitamins capsule Take 1 capsule by mouth daily.   cephALEXin  500 MG capsule Commonly known as: KEFLEX  Take 500 mg by mouth 2 (two) times daily.   clotrimazole -betamethasone  cream Commonly known as: LOTRISONE  Apply 1 Application topically 2 (two) times daily as needed (for irritation under the breasts or abdominal folds).   docusate sodium  100 MG capsule Commonly known as: Colace Take 1 capsule (100 mg total) by mouth 2 (two) times daily.   DULoxetine  60 MG capsule Commonly known as: CYMBALTA  Take 60 mg  by mouth at bedtime.   famotidine  20 MG tablet Commonly known as: PEPCID  TAKE 1 TABLET BY MOUTH AFTER SUPPER   gabapentin  400 MG capsule Commonly known as: NEURONTIN  Take 400 mg by mouth 3 (three) times daily.   HYDROmorphone  2 MG tablet Commonly known as: Dilaudid  Take 1-2 tablets (2-4 mg total) by mouth every 4 (four) hours as needed for severe pain (pain score 7-10). What changed:  medication strength how much to take when to take this   naloxone  4 MG/0.1ML Liqd nasal spray kit Commonly known as: NARCAN  Place 1 spray into the nose once as needed (accidental over dose).   nutrition supplement (JUVEN) Pack Take 1 packet by mouth daily.   nystatin  powder Commonly known as: MYCOSTATIN /NYSTOP  Apply topically 3 (three) times daily.   ondansetron  4 MG tablet Commonly known as: Zofran  Take 1 tablet (4 mg total) by mouth every 8 (eight) hours as needed for nausea or vomiting.   pantoprazole  40 MG tablet Commonly known as:  PROTONIX  Take 40 mg by mouth every morning.   polyethylene glycol 17 g packet Commonly known as: MIRALAX  / GLYCOLAX  Take 17 g by mouth daily.   promethazine  25 MG tablet Commonly known as: PHENERGAN  Take 25 mg by mouth every 6 (six) hours as needed for nausea or vomiting.   senna 8.6 MG Tabs tablet Commonly known as: SENOKOT Take 2 tablets (17.2 mg total) by mouth at bedtime for 15 days. What changed: how much to take   tiZANidine  2 MG tablet Commonly known as: ZANAFLEX  Take 2 mg by mouth every 12 (twelve) hours as needed for muscle spasms.   Vitamin D3 125 MCG (5000 UT) Caps Take 5,000 Units by mouth daily.   Xarelto  20 MG Tabs tablet Generic drug: rivaroxaban  Take 20 mg by mouth daily with supper.          Signed: Elspeth JONELLE Her 10/15/2024, 7:54 AM  [facility] is now [employer] [address], [address], [place], [place] [address] Phone: [phone] Facebook  Instagram  [employer]

## 2024-10-15 NOTE — Evaluation (Signed)
 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  Pt reports typically using BSC, but able to use commode in bathroom if she is is already in there for a shower.  She is very fearful of falling and anxious of mobility.  Today, she requires mod assist to transition to EOB, max assist +2 to stand, and contact guard to total assist +2 for ADLs. Limited by R knee pain, balance, decreased activity tolerance and weakness.  Based on performance today, pt will need +2 assist if she were to dc home.  Pt not interested in post acute rehab, but may need to consider this if she does not progress.  Will defer dc recommendations to surgeon at this time. Will follow.      If plan is discharge home, recommend the following:   Two people to help with walking and/or transfers;A lot of help with bathing/dressing/bathroom;Assistance with cooking/housework;Assist for transportation;Help with stairs or ramp for entrance     Functional Status Assessment   Patient has had a recent decline in their functional status and demonstrates the ability to make significant improvements in function in a reasonable and predictable amount of time.     Equipment Recommendations   None recommended by OT     Recommendations for Other Services         Precautions/Restrictions   Precautions Precautions: Knee;Fall Recall of Precautions/Restrictions: Intact Restrictions Weight Bearing Restrictions Per  Provider Order: Yes Other Position/Activity Restrictions: WBAT     Mobility Bed Mobility Overal bed mobility: Needs Assistance Bed Mobility: Supine to Sit, Sit to Supine     Supine to sit: Mod assist, Used rails, HOB elevated Sit to supine: Max assist, +2 for physical assistance   General bed mobility comments: increased time and support for R LE towards EOB/ trunk support.  Returned to bed with spouse assisting LB.    Transfers Overall transfer level: Needs assistance Equipment used: Rolling walker (2 wheels) Transfers: Sit to/from Stand Sit to Stand: Max assist, +2 physical assistance           General transfer comment: cueing for hand placement, max assist +2 to stand (spouse assisting).  Encouragement for 2nd attempt as did not fully stand on first attempt.      Balance Overall balance assessment: Needs assistance Sitting-balance support: No upper extremity supported, Feet supported Sitting balance-Leahy Scale: Good     Standing balance support: Bilateral upper extremity supported, During functional activity Standing balance-Leahy Scale: Poor                             ADL either performed or assessed with clinical judgement   ADL Overall ADL's : Needs assistance/impaired     Grooming: Set up;Sitting           Upper Body Dressing : Sitting;Contact guard assist   Lower Body Dressing: Total assistance;+2 for physical assistance;Sit to/from Market Researcher Details (indicate cue type and reason):  deferred         Functional mobility during ADLs: Maximal assistance;Rolling walker (2 wheels);+2 for physical assistance       Vision   Vision Assessment?: No apparent visual deficits     Perception         Praxis         Pertinent Vitals/Pain Pain Assessment Pain Assessment: 0-10 Pain Score: 6  Pain Location: R knee Pain Descriptors / Indicators: Aching, Sore Pain Intervention(s): Limited activity within patient's  tolerance, Monitored during session, Repositioned     Extremity/Trunk Assessment Upper Extremity Assessment Upper Extremity Assessment: Overall WFL for tasks assessed   Lower Extremity Assessment Lower Extremity Assessment: Defer to PT evaluation (s/p R TKA)       Communication Communication Communication: No apparent difficulties   Cognition Arousal: Alert Behavior During Therapy: Anxious, Flat affect Cognition: Cognition impaired     Awareness: Online awareness impaired   Attention impairment (select first level of impairment): Selective attention Executive functioning impairment (select all impairments): Problem solving OT - Cognition Comments: slow processing, internally distracted by pain.                 Following commands: Intact       Cueing  General Comments   Cueing Techniques: Verbal cues      Exercises     Shoulder Instructions      Home Living Family/patient expects to be discharged to:: Private residence Living Arrangements: Spouse/significant other Available Help at Discharge: Family;Available 24 hours/day Type of Home: House Home Access: Stairs to enter [employer] of Steps: 2 Entrance Stairs-Rails: None Home Layout: One level     Bathroom Shower/Tub: Chief Strategy Officer: Handicapped height     Home Equipment: Agricultural Consultant (2 wheels);Cane - single point;Wheelchair Financial Trader (4 wheels);BSC/3in1;Tub bench;Grab bars - tub/shower   Additional Comments: Purewick      Prior Functioning/Environment Prior Level of Function : History of Falls (last six months);Needs assist             Mobility Comments: Pt had 5 surgeries on L knee from 2023-2024 , L knee progressed and doing better but then R knee started buckling, painful, and giving out in dec of 2024.  Since Dec of 2024 she has had multiple falls and limited mobility.  Husband has been pulling her up steps in w/c.  She has been ambulating in  home some but also uses w/c if needed.  Walks with RW. ADLs Comments: pt needing assist for ADLs, reports able to walk to bathroom when needing to shower with support of spouse.  Using TTB for showers and able to sit and complete bathing, but needing assist for LB dressing.  Typically uses BSC for toileting needs if not already in bathroom, uses purewick when spouse is gone.    OT Problem List: Decreased strength;Decreased activity tolerance;Impaired balance (sitting and/or standing);Decreased knowledge of use of DME or AE;Decreased knowledge of precautions;Pain;Obesity   OT Treatment/Interventions: Self-care/ADL training;Therapeutic exercise;DME and/or AE instruction;Therapeutic activities;Cognitive remediation/compensation;Patient/family education;Balance training      OT Goals(Current goals can be found in the care plan section)   Acute Rehab OT Goals Patient Stated Goal: home OT Goal Formulation: With patient Time For Goal Achievement: 10/29/24 Potential to Achieve Goals: Fair   OT Frequency:  Min 2X/week    Co-evaluation              AM-PAC OT 6 Clicks Daily Activity     Outcome Measure Help from another  person eating meals?: A Little Help from another person taking care of personal grooming?: A Little Help from another person toileting, which includes using toliet, bedpan, or urinal?: Total Help from another person bathing (including washing, rinsing, drying)?: A Lot Help from another person to put on and taking off regular upper body clothing?: A Little Help from another person to put on and taking off regular lower body clothing?: Total 6 Click Score: 13   End of Session Equipment Utilized During Treatment: Gait belt;Rolling walker (2 wheels) Nurse Communication: Mobility status;Precautions  Activity Tolerance: Patient limited by pain Patient left: in bed;with call bell/phone within reach;with bed alarm set;with family/visitor present  OT Visit Diagnosis: Other  abnormalities of gait and mobility (R26.89);Muscle weakness (generalized) (M62.81);Pain Pain - Right/Left: Right Pain - part of body: Knee                Time: 1434-1501 OT Time Calculation (min): 27 min Charges:  OT General Charges $OT Visit: 1 Visit OT Evaluation $OT Eval Moderate Complexity: 1 Mod OT Treatments $Self Care/Home Management : 8-22 mins  Etta NOVAK, OT Acute Rehabilitation Services Office [phone] Secure Chat Preferred    Etta GORMAN Starlette 10/15/2024, 3:36 PM

## 2024-10-15 NOTE — Care Management Obs Status (Signed)
 MEDICARE OBSERVATION STATUS NOTIFICATION   Patient Details  Name: Onyinyechi Huante MRN: [id number] Date of Birth: [birth year]   Medicare Observation Status Notification Given:  Yes    Sheri ONEIDA Sharps, LCSW 10/15/2024, 4:47 PM

## 2024-10-15 NOTE — Progress Notes (Signed)
 Orthopedics Progress Note  Subjective: Patient reports minimal pain today. She was very sleepy yesterday and not able to do much therapy but would like to go home today if she clears PT  Objective:  Vitals:   10/14/24 1932 10/15/24 0611  BP: 136/86 121/69  Pulse: (!) 108 (!) 109  Resp: 18 18  Temp: 98.2 F (36.8 C) 100 F (37.8 C)  SpO2: 93% 92%    General: Awake and alert  Musculoskeletal: Right knee dressing CDI. Compartments supple and Neg homans. Good extension and ok quad contraction Neurovascularly intact  Lab Results  Component Value Date   WBC 10.6 (H) 10/15/2024   HGB 11.9 (L) 10/15/2024   HCT 39.8 10/15/2024   MCV 101.3 (H) 10/15/2024   PLT 133 (L) 10/15/2024       Component Value Date/Time   NA 136 10/15/2024 0319   K 4.0 10/15/2024 0319   CL 100 10/15/2024 0319   CO2 29 10/15/2024 0319   GLUCOSE 119 (H) 10/15/2024 0319   BUN 11 10/15/2024 0319   CREATININE 0.52 10/15/2024 0319   CREATININE 0.64 08/07/2024 1303   CALCIUM 8.5 (L) 10/15/2024 0319   GFRNONAA >60 10/15/2024 0319   GFRNONAA >60 08/07/2024 1303    Lab Results  Component Value Date   INR 1.6 (H) 08/11/2024   INR 1.3 (H) 03/10/2023   INR 1.4 (H) 12/11/2022    Assessment/Plan: POD #3 s/p Procedure(s): ARTHROPLASTY, KNEE, TOTAL, USING IMAGELESS COMPUTER-ASSISTED NAVIGATION PT today and possible discharge home if clears PT  Elspeth R. Kay, MD 10/15/2024 7:51 AM

## 2024-10-15 NOTE — Progress Notes (Signed)
 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  This date, therex program attempted but deferred with discovery of urine soaked bed 2* Purewick malfunction.  Pt requiring increased time and significant assist of two to move to EOB sitting and to stand x 2 to allow for changing of linens. Pt returned to sitting bedside with min warning and tolerating min WB on R LE so attempts at ambulation or to transfer to chair deferred for pt safety.  Pt assisted to bed and CNA in to assist with hygiene.  RN aware.    If plan is discharge home, recommend the following: Two people to help with walking and/or transfers;Two people to help with bathing/dressing/bathroom;Assist for transportation;Help with stairs or ramp for entrance   Can travel by private vehicle        Equipment Recommendations  None recommended by PT    Recommendations for Other Services       Precautions / Restrictions Precautions Precautions: Knee;Fall Restrictions Weight Bearing Restrictions Per Provider Order: No Other Position/Activity Restrictions: WBAT     Mobility  Bed Mobility Overal bed mobility: Needs Assistance Bed Mobility: Supine to Sit, Sit to Supine     Supine to sit: Mod assist, +2 for physical assistance, +2 for safety/equipment Sit to supine: Max assist, +2 for physical assistance, +2 for safety/equipment   General bed mobility comments: Increased time with cues for sequence and physical assist to manage R LE, control trunk and  complete rotation to/from EOB sitting with use of bed pad    Transfers Overall transfer level: Needs assistance Equipment used: Rolling walker (2 wheels) Transfers: Sit to/from Stand Sit to Stand: Mod assist, Max assist, +2 physical assistance, +2 safety/equipment, From elevated surface           General transfer comment: cues for LE management and use of UEs to self assist; Assist of two to bring wt up and fwd and to balance in standing with RW.  Pt to standing x 2 to allow change of soiled bed linens but returns to sitting each time with limited warning    Ambulation/Gait               General Gait Details: Stood only - unable and unsafe to attempt gait or transfers to recliner   Stairs             Wheelchair Mobility     Tilt Bed    Modified Rankin (Stroke Patients Only)       Balance Overall balance assessment: Needs assistance Sitting-balance support: No upper extremity supported, Feet supported Sitting balance-Leahy Scale: Good     Standing balance support: Bilateral upper extremity supported Standing balance-Leahy Scale: Poor                              Communication Communication Communication: No apparent difficulties  Cognition Arousal: Alert Behavior During Therapy: Anxious  Following commands: Intact      Cueing Cueing Techniques: Verbal cues, Gestural cues  Exercises Total Joint Exercises Ankle Circles/Pumps: AROM, Both, 15 reps, Supine Quad Sets: AROM, Both, 10 reps, Supine    General Comments        Pertinent Vitals/Pain Pain Assessment Pain Assessment: 0-10 Pain Score: 6  Pain Location: R knee Pain Descriptors / Indicators: Aching, Sore Pain Intervention(s): Limited activity within patient's tolerance, Monitored during session, Premedicated before session, Ice applied    Home Living                          Prior Function            PT Goals (current  goals can now be found in the care plan section) Acute Rehab PT Goals Patient Stated Goal: Regain IND to go on trips with spouse PT Goal Formulation: With patient Time For Goal Achievement: 10/27/24 Potential to Achieve Goals: Good Progress towards PT goals: Not progressing toward goals - comment (ltd by fatigue/nausea and generalized weakness)    Frequency    7X/week      PT Plan      Co-evaluation              AM-PAC PT 6 Clicks Mobility   Outcome Measure  Help needed turning from your back to your side while in a flat bed without using bedrails?: A Lot Help needed moving from lying on your back to sitting on the side of a flat bed without using bedrails?: A Lot Help needed moving to and from a bed to a chair (including a wheelchair)?: A Lot Help needed standing up from a chair using your arms (e.g., wheelchair or bedside chair)?: Total Help needed to walk in hospital room?: Total Help needed climbing 3-5 steps with a railing? : Total 6 Click Score: 9    End of Session Equipment Utilized During Treatment: Gait belt Activity Tolerance: Patient limited by fatigue;Other (comment) (nausea) Patient left: in bed;with call bell/phone within reach;with bed alarm set;with family/visitor present Nurse Communication: Mobility status PT Visit Diagnosis: Difficulty in walking, not elsewhere classified (R26.2)     Time: 8849-8779 PT Time Calculation (min) (ACUTE ONLY): 30 min  Charges:    $Therapeutic Activity: 23-37 mins PT General Charges $$ ACUTE PT VISIT: 1 Visit                     [facility] PT Acute Rehabilitation Services Office [phone]     Ruhaan Nordahl 10/15/2024, 2:55 PM

## 2024-10-15 NOTE — Progress Notes (Signed)
   10/15/24 2255  BiPAP/CPAP/SIPAP  Reason BIPAP/CPAP not in use Other(comment) (PATIENT STATED TO DAY SHIFT THAT SHE DID NOT WANT TO WEAR OUR MACHINE AND THAT SHE WOULD HAVE HERS BROUGHT IN. SHE DOES NOT HAVE THE MACHINE FROM HOME , AND SHE STATED THAT SHE DID NOT WANT TO USE THE HOSPITAL'S MACHINE.)

## 2024-10-16 ENCOUNTER — Other Ambulatory Visit (HOSPITAL_COMMUNITY): Payer: Self-pay

## 2024-10-16 DIAGNOSIS — M1711 Unilateral primary osteoarthritis, right knee: Secondary | ICD-10-CM | POA: Diagnosis not present

## 2024-10-16 NOTE — Plan of Care (Signed)
   Problem: Health Behavior/Discharge Planning: Goal: Ability to manage health-related needs will improve Outcome: Progressing   Problem: Clinical Measurements: Goal: Ability to maintain clinical measurements within normal limits will improve Outcome: Progressing Goal: Will remain free from infection Outcome: Progressing

## 2024-10-16 NOTE — Progress Notes (Signed)
 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 Stedy used for pivot to chair, pt tol well with decr fear of falling, tolerated repeated STS while in stedy as well. Barrier to d/c  home is diminished standing tolerance, will continue to work towards independence with stand pivot - pt needs to be able to pivot from w/c <> car unless PTAR transport home is an option.  Continue PT in acute setting    If plan is discharge home, recommend the following: Two people to help with walking and/or transfers;Two people to help with bathing/dressing/bathroom;Assist for transportation;Help with stairs or ramp for entrance   Can travel by private vehicle        Equipment Recommendations  None recommended by PT    Recommendations for Other Services       Precautions / Restrictions Precautions Precautions: Knee;Fall Recall of Precautions/Restrictions: Intact Restrictions Weight Bearing Restrictions Per Provider Order: No RLE Weight Bearing Per Provider Order: Weight bearing as tolerated     Mobility  Bed Mobility Overal bed mobility: Needs Assistance Bed Mobility: Supine to Sit     Supine to sit: Min assist, Used rails     General bed mobility comments: increased time, cues to self assist, pt able to progress RLE off EOB wiht gait belt as leg lifter; min assist to elvate trunk    Transfers Overall transfer level: Needs assistance Equipment used: Rolling walker (2 wheels) Transfers: Sit to/from Stand, Bed to chair/wheelchair/BSC Sit to Stand: Min  assist, Mod assist, From elevated surface           General transfer comment: repeated STS, repeated partial STS once in stedy with min-mod assist. cues to power up with LEs, for full trunk/hip extension Transfer via Lift Equipment: Stedy  Ambulation/Gait                   Stairs             Wheelchair Mobility     Tilt Bed    Modified Rankin (Stroke Patients Only)       Balance   Sitting-balance support: Feet supported, No upper extremity supported Sitting balance-Leahy Scale: Fair Sitting balance - Comments: pt able to scoot reciprocally in sitting fwd/back   Standing balance support: Bilateral upper extremity supported, During functional activity Standing balance-Leahy Scale: Poor Standing balance comment: reliant on UEs                            Communication Communication Communication: No apparent difficulties  Cognition Arousal: Alert Behavior During Therapy: WFL for tasks assessed/performed                             Following commands: Intact      Cueing Cueing Techniques: Verbal cues, Gestural cues  Exercises Total Joint Exercises Ankle Circles/Pumps: AROM, Both, Supine, 10 reps Quad Sets: AROM, Both, 5 reps Heel Slides: AAROM, Right, Supine, 10 reps Straight Leg Raises: AAROM, Both, 10 reps  General Comments General comments (skin integrity, edema, etc.): Patient abandoned sit > stand trials x3 with +2 assistance, saying I don't want to fall.      Pertinent Vitals/Pain Pain Assessment Pain Assessment: 0-10 Pain Score: 4  Pain Location: Right knee Pain Descriptors / Indicators: Aching, Sore Pain Intervention(s): Limited activity within patient's tolerance, Monitored during session, Premedicated before session, Repositioned    Home Living                          Prior Function            PT Goals (current goals can now be found in the care plan section) Acute Rehab PT Goals Patient  Stated Goal: Regain IND to go on trips with spouse PT Goal Formulation: With patient Time For Goal Achievement: 10/27/24 Potential to Achieve Goals: Good Progress towards PT goals: Progressing toward goals    Frequency    7X/week      PT Plan      Co-evaluation              AM-PAC PT 6 Clicks Mobility   Outcome Measure  Help needed turning from your back to your side while in a flat bed without using bedrails?: A Lot Help needed moving from lying on your back to sitting on the side of a flat bed without using bedrails?: A Little Help needed moving to and from a bed to a chair (including a wheelchair)?: Total Help needed standing up from a chair using your arms (e.g., wheelchair or bedside chair)?: A Lot Help needed to walk in hospital room?: Total Help needed climbing 3-5 steps with a railing? : Total 6 Click Score: 10    End of Session Equipment Utilized During Treatment: Gait belt;Other (comment) (stedy) Activity Tolerance: Patient tolerated treatment well Patient left: in chair;with call bell/phone within reach;with chair alarm set;with family/visitor present Nurse Communication: Mobility status;Need for lift equipment (stedy) PT Visit Diagnosis: Difficulty in walking, not elsewhere classified (R26.2)     Time: 8942-8876 PT Time Calculation (min) (ACUTE ONLY): 26 min  Charges:    $Therapeutic Activity: 23-37 mins PT General Charges $$ ACUTE PT VISIT: 1 Visit                     Norma Anthony, PT  Acute Rehab Dept [facility]) [phone]  10/16/2024    [facility] 10/16/2024, 11:53 AM

## 2024-10-16 NOTE — Progress Notes (Signed)
 Discharge meds in a secure bag delivered to patient in room by this RN

## 2024-10-16 NOTE — Progress Notes (Addendum)
 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  Patient in 3 trials of sit > stand from EOB; for each trial, patient stood < 5 seconds and immediately returned to EOB, saying I don't want to fall.  Patient's spouse was present in room and verbalized concern about patient's ability to safely return home if she is unable to stand, saying I can't lift her.  Next visit, patient can benefit from trials of sliding board and STEDY transfers.  Follow-up plans need further consideration given patient's performance this visit.  Acute OT will continue to follow patient while in the hospital to address performance deficits described herein.  Thank you for allowing us  to participate in the care of this patient.      If plan is discharge home, recommend the following:  Two people to help with walking and/or transfers;A lot of help with bathing/dressing/bathroom;Assistance with cooking/housework;Assist for transportation;Help with stairs or ramp for entrance   Equipment Recommendations  None recommended by OT (Pending further assessment of needs)       Precautions / Restrictions Precautions Precautions: Knee;Fall Recall of Precautions/Restrictions: Intact Restrictions Weight Bearing Restrictions Per Provider Order: Yes RLE Weight Bearing Per Provider Order: Weight bearing as tolerated       Mobility Bed Mobility Overal bed mobility: Needs Assistance Bed Mobility: Supine to Sit, Sit to Supine   Supine to sit: Mod assist, HOB  elevated Sit to supine: Max assist, +2 for physical assistance    Transfers Overall transfer level: Needs assistance Equipment used: Rolling walker (2 wheels) Transfers: Sit to/from Stand Sit to Stand: Max assist, +2 physical assistance     Balance Overall balance assessment: Needs assistance Sitting-balance support: Feet supported Sitting balance-Leahy Scale: Fair   Standing balance support: Bilateral upper extremity supported, During functional activity Standing balance-Leahy Scale: Poor     ADL either performed or assessed with clinical judgement   ADL Lower Body Dressing: Maximal assistance;Bed level Lower Body Dressing Details (indicate cue type and reason): Donned socks TotalA for operative RLE; MaxA for LLE   Functional mobility during ADLs: Maximal assistance;Rolling walker (2 wheels);+2 for physical assistance        Cognition Following commands: Intact                 General Comments Patient abandoned sit > stand trials x3 with +2 assistance, saying I don't want to fall.    Pertinent Vitals/ Pain       Pain Assessment Pain Assessment: 0-10 Pain Score: 6  Pain Location: Right knee Pain Descriptors / Indicators: Aching, Sore Pain Intervention(s): Monitored during session, Premedicated before session, Repositioned         Frequency  Min 2X/week        Progress Toward Goals  OT Goals(current goals can now be found in the care plan section)  Progress towards OT goals: Progressing toward goals  Acute Rehab OT Goals Patient Stated Goal: Return home OT Goal Formulation: With patient/family Time For Goal Achievement: 10/29/24 Potential to Achieve Goals: Fair  Plan  AM-PAC OT 6 Clicks Daily Activity     Outcome Measure   Help from another person eating meals?: None Help from another person taking care of personal grooming?: A Little Help from another person toileting, which includes using toliet, bedpan, or urinal?: Total Help  from another person bathing (including washing, rinsing, drying)?: A Lot Help from another person to put on and taking off regular upper body clothing?: A Little Help from another person to put on and taking off regular lower body clothing?: A Lot 6 Click Score: 15    End of Session Equipment Utilized During Treatment: Gait belt;Rolling walker (2 wheels)  OT Visit Diagnosis: Unsteadiness on feet (R26.81);Pain;Muscle weakness (generalized) (M62.81) Pain - Right/Left: Right Pain - part of body: Knee   Activity Tolerance Patient limited by fatigue   Patient Left in bed;with call bell/phone within reach;with bed alarm set;with family/visitor present   Nurse Communication Mobility status        Time: 1009-1040 OT Time Calculation (min): 31 min  Charges: OT General Charges $OT Visit: 1 Visit OT Treatments $Therapeutic Activity: 23-37 mins  Darry Kelnhofer B. Shell Blanchette, MS, OTR/L 10/16/2024, 12:18 PM

## 2024-10-16 NOTE — Progress Notes (Signed)
 10/16/24 1500  PT Visit Information  Last PT Received On 10/16/24  Assistance Needed +2 (for progression)  Returned to assist pt from [facility] >bed, working on incr independence with pivot transfers; pt is fearful of falling  however with cues shows incr ability to self assist this pm. Overall min assist with RW for SPT BSC>bed. Min assist to return to flat be without use of rails.  Spouse present for session. Pt and spouse express concern over pt falling, MD/PA aware; pt had limited mobility at baseline per PT eval.   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 , obesity, OSA, GERD, fibromyalgia, arthritis who had left TKA in the past followed by MSSA infection requiring multiple restaged surgeries  Subjective Data  Patient Stated Goal Regain IND to go on trips with spouse  Precautions  Precautions Knee;Fall  Recall of Precautions/Restrictions Intact  Restrictions  Weight Bearing Restrictions Per Provider Order No  RLE Weight Bearing Per Provider Order WBAT  Pain Assessment  Pain Assessment 0-10  Pain Location Right knee  Pain Descriptors / Indicators Aching;Sore  Pain Intervention(s) Limited activity within patient's tolerance;Monitored during session;Premedicated before session;Repositioned (declines ice)  Cognition  Arousal Alert  Behavior During Therapy WFL for tasks assessed/performed  PT - Cognitive impairments No apparent impairments  Following Commands  Following commands Intact  Cueing  Cueing Techniques Verbal cues;Gestural cues  Communication  Communication No apparent difficulties  Bed Mobility  Bed Mobility Sit to Supine  Sit to supine Min assist  General bed mobility comments increased time, cues to self assist, assist to facilitate lifting bil LEs on to bed; pt able to reposition in bed with cues  Transfers  Overall transfer level Needs assistance   Equipment used Rolling walker (2 wheels)  Transfers Sit to/from Stand;Bed to chair/wheelchair/BSC  Sit to Stand Min assist;+2 safety/equipment  Step pivot transfers Min assist;+2 safety/equipment;Mod assist  General transfer comment cues for hand placement and  LE position; verbal cues for use of UEs for off loading RLE, sequencing of standpivot BSC to bed  Balance  Sitting-balance support Feet supported;No upper extremity supported  Sitting balance-Leahy Scale Fair  Sitting balance - Comments pt able to scoot reciprocally in sitting fwd/back  Standing balance support Bilateral upper extremity supported;During functional activity  Standing balance-Leahy Scale Poor  Standing balance comment reliant on UEs  PT - End of Session  Equipment Utilized During Treatment Gait belt;Other (comment)  Activity Tolerance Patient tolerated treatment well  Patient left with family/visitor present;with call bell/phone within reach;in bed;with bed alarm set  Nurse Communication Mobility status;Need for lift equipment (stedy)   PT - Assessment/Plan  PT Visit Diagnosis Difficulty in walking, not elsewhere classified (R26.2)  PT Frequency (ACUTE ONLY) 7X/week  Follow Up Recommendations Follow physician's recommendations for discharge plan and follow up therapies  Patient can return home with the following Two people to help with walking and/or transfers;Two people to help with bathing/dressing/bathroom;Assist for transportation;Help with stairs or ramp for entrance  PT equipment None recommended by PT  AM-PAC PT 6 Clicks Mobility Outcome Measure (Version 2)  Help needed turning from your back to your side while in a flat bed without using bedrails? 2  Help needed moving from lying on your back to sitting on the side of a flat bed without using bedrails? 3  Help needed moving to and from a bed to  a chair (including a wheelchair)? 3  Help needed standing up from a chair using your arms (e.g., wheelchair or  bedside chair)? 3  Help needed to walk in hospital room? 2  Help needed climbing 3-5 steps with a railing?  1  6 Click Score 14  Consider Recommendation of Discharge To: CIR/SNF/LTACH  Progressive Mobility  What is the highest level of mobility based on the mobility assessment? Level 3 (Stands with assistance) - Balance while standing  and cannot march in place  PT Goal Progression  Progress towards PT goals Progressing toward goals  Acute Rehab PT Goals  PT Goal Formulation With patient  Time For Goal Achievement 10/27/24  Potential to Achieve Goals Good  PT Time Calculation  PT Start Time (ACUTE ONLY) 1435  PT Stop Time (ACUTE ONLY) 1445  PT Time Calculation (min) (ACUTE ONLY) 10 min  PT General Charges  $$ ACUTE PT VISIT 1 Visit  PT Treatments  $Therapeutic Activity 8-22 mins

## 2024-10-16 NOTE — Progress Notes (Signed)
 10/16/24 1400  PT Visit Information  Last PT Received On 10/16/24  Assistance Needed +  Good pt effort with STS x2 and stand pivot chair to [facility]; pt requires multi-modal cues, min assist with +2 for safety. 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 , obesity, OSA, GERD, fibromyalgia, arthritis who had left TKA in the past followed by MSSA infection requiring multiple restaged surgeries  Subjective Data  Patient Stated Goal Regain IND to go on trips with spouse  Precautions  Precautions Knee;Fall  Recall of Precautions/Restrictions Intact  Restrictions  Weight Bearing Restrictions Per Provider Order No  RLE Weight Bearing Per Provider Order WBAT  Pain Assessment  Pain Assessment 0-10  Pain Score 4  Pain Location Right knee  Pain Descriptors / Indicators Aching;Sore  Pain Intervention(s) Limited activity within patient's tolerance;Monitored during session;Premedicated before session;Repositioned  Cognition  Arousal Alert  Behavior During Therapy WFL for tasks assessed/performed  PT - Cognitive impairments No apparent impairments  Following Commands  Following commands Intact  Cueing  Cueing Techniques Verbal cues;Gestural cues  Communication  Communication No apparent difficulties  Transfers  Overall transfer level Needs assistance  Equipment used Rolling walker (2 wheels)  Transfers Sit to/from Stand;Bed to chair/wheelchair/BSC  Sit to Stand Min assist;+2 safety/equipment;+2 physical assistance  Bed to/from chair/wheelchair/BSC transfer type: Step pivot  Step pivot transfers Min assist;+2 physical assistance;+2 safety/equipment  Transfer via Lift Equipment   General transfer comment cues for hand placement and  LE position; verbal cues for use of UEs for off loading RLE, sequencing of  standpivot chair to [facility]  Balance  Sitting-balance support Feet supported;No upper extremity supported  Sitting balance-Leahy Scale Fair  Sitting balance - Comments pt able to scoot reciprocally in sitting fwd/back  Standing balance support Bilateral upper extremity supported;During functional activity  Standing balance-Leahy Scale Poor  Standing balance comment reliant on UEs  PT - End of Session  Equipment Utilized During Treatment Gait belt;Other (comment)  Activity Tolerance Patient tolerated treatment well  Patient left with family/visitor present;with call bell/phone within reach;Other (comment) (BSC, left per pt request)  Nurse Communication Mobility status;Need for lift equipment (stedy)   PT - Assessment/Plan  PT Visit Diagnosis Difficulty in walking, not elsewhere classified (R26.2)  PT Frequency (ACUTE ONLY) 7X/week  Follow Up Recommendations Follow physician's recommendations for discharge plan and follow up therapies  Patient can return home with the following Two people to help with walking and/or transfers;Two people to help with bathing/dressing/bathroom;Assist for transportation;Help with stairs or ramp for entrance  PT equipment None recommended by PT  AM-PAC PT 6 Clicks Mobility Outcome Measure (Version 2)  Help needed turning from your back to your side while in a flat bed without using bedrails? 2  Help needed moving from lying on your back to sitting on the side of a flat bed without using bedrails? 3  Help needed moving to and from a bed to a chair (including a wheelchair)? 3  Help needed standing up from a chair using your arms (e.g., wheelchair or bedside chair)? 3  Help needed to walk in hospital room? 2  Help needed climbing 3-5 steps with a railing?  1  6 Click Score 14  Consider Recommendation of Discharge To: CIR/SNF/LTACH  Progressive Mobility  What is the highest level of  mobility based on the mobility assessment? Level 3 (Stands with assistance) -  Balance while standing  and cannot march in place  PT Goal Progression  Progress towards PT goals Progressing toward goals  Acute Rehab PT Goals  PT Goal Formulation With patient  Time For Goal Achievement 10/27/24  Potential to Achieve Goals Good  PT Time Calculation  PT Start Time (ACUTE ONLY) 1408  PT Stop Time (ACUTE ONLY) 1426  PT Time Calculation (min) (ACUTE ONLY) 18 min  PT General Charges  $$ ACUTE PT VISIT 1 Visit  PT Treatments  $Therapeutic Activity 8-22 mins

## 2024-10-16 NOTE — Progress Notes (Signed)
    Subjective:  Patient reports pain as mild to moderate.  Denies N/V/CP/SOB/Abd pain.  She is voiding without difficulty. Positive flatus negative BM.  She is hopeful for d/c home today.   Objective:   VITALS:   Vitals:   10/15/24 2147 10/16/24 0004 10/16/24 0139 10/16/24 0539  BP: (!) 150/80 (!) 140/87 (!) 158/70 (!) 155/81  Pulse: (!) 110 (!) 105 (!) 104 100  Resp: 20 20 17 16   Temp: (!) 101.1 F (38.4 C) 99.2 F (37.3 C) 98.2 F (36.8 C) 97.9 F (36.6 C)  TempSrc: Oral Oral Oral Oral  SpO2: 93% 96% 95% 95%  Weight:      Height:        NAD Neurologically intact ABD soft Neurovascular intact Sensation intact distally Intact pulses distally Dorsiflexion/Plantar flexion intact Incision: dressing C/D/I No cellulitis present Compartment soft   Lab Results  Component Value Date   WBC 10.6 (H) 10/15/2024   HGB 11.9 (L) 10/15/2024   HCT 39.8 10/15/2024   MCV 101.3 (H) 10/15/2024   PLT 133 (L) 10/15/2024   BMET    Component Value Date/Time   NA 136 10/15/2024 0319   K 4.0 10/15/2024 0319   CL 100 10/15/2024 0319   CO2 29 10/15/2024 0319   GLUCOSE 119 (H) 10/15/2024 0319   BUN 11 10/15/2024 0319   CREATININE 0.52 10/15/2024 0319   CREATININE 0.64 08/07/2024 1303   CALCIUM 8.5 (L) 10/15/2024 0319   EGFR 101.0 04/13/2023 1511   GFRNONAA >60 10/15/2024 0319   GFRNONAA >60 08/07/2024 1303     Assessment/Plan: 4 Days Post-Op   Principal Problem:   S/P total knee arthroplasty, right   WBAT with walker DVT ppx: Xarelto , SCDs, TEDS PO pain control PT/OT: Slow progression with PT. Continue today.  Dispo:  - Patient with limited mobility at baseline. Slow progression with PT. D/c home with OPPT once cleared with PT.    Norma Anthony Potters 10/16/2024, 8:15 AM   [facility]  [place] [address], [place], [place] [address] Phone: [phone] [email or web address] Facebook  [employer]

## 2024-10-18 ENCOUNTER — Emergency Department (HOSPITAL_COMMUNITY)
Admission: EM | Admit: 2024-10-18 | Discharge: 2024-10-18 | Disposition: A | Discharge status: Home / Self Care | Attending: Emergency Medicine | Admitting: Emergency Medicine

## 2024-10-18 ENCOUNTER — Emergency Department (HOSPITAL_COMMUNITY)

## 2024-10-18 ENCOUNTER — Other Ambulatory Visit: Payer: Self-pay

## 2024-10-18 DIAGNOSIS — I6782 Cerebral ischemia: Secondary | ICD-10-CM | POA: Insufficient documentation

## 2024-10-18 DIAGNOSIS — Z7901 Long term (current) use of anticoagulants: Secondary | ICD-10-CM | POA: Insufficient documentation

## 2024-10-18 DIAGNOSIS — M545 Low back pain, unspecified: Secondary | ICD-10-CM | POA: Diagnosis not present

## 2024-10-18 DIAGNOSIS — S8002XA Contusion of left knee, initial encounter: Secondary | ICD-10-CM | POA: Diagnosis not present

## 2024-10-18 DIAGNOSIS — W06XXXA Fall from bed, initial encounter: Secondary | ICD-10-CM | POA: Insufficient documentation

## 2024-10-18 DIAGNOSIS — M25552 Pain in left hip: Secondary | ICD-10-CM | POA: Insufficient documentation

## 2024-10-18 DIAGNOSIS — J449 Chronic obstructive pulmonary disease, unspecified: Secondary | ICD-10-CM | POA: Insufficient documentation

## 2024-10-18 DIAGNOSIS — M25561 Pain in right knee: Secondary | ICD-10-CM | POA: Diagnosis present

## 2024-10-18 DIAGNOSIS — M25551 Pain in right hip: Secondary | ICD-10-CM | POA: Insufficient documentation

## 2024-10-18 DIAGNOSIS — I2510 Atherosclerotic heart disease of native coronary artery without angina pectoris: Secondary | ICD-10-CM | POA: Diagnosis not present

## 2024-10-18 LAB — CBC WITH DIFFERENTIAL/PLATELET
Abs Immature Granulocytes: 0.05 K/uL (ref 0.00–0.07)
Basophils Absolute: 0 K/uL (ref 0.0–0.1)
Basophils Relative: 0 %
Eosinophils Absolute: 0.4 K/uL (ref 0.0–0.5)
Eosinophils Relative: 5 %
HCT: 33.2 % — ABNORMAL LOW (ref 36.0–46.0)
Hemoglobin: 10.4 g/dL — ABNORMAL LOW (ref 12.0–15.0)
Immature Granulocytes: 1 %
Lymphocytes Relative: 19 %
Lymphs Abs: 1.7 K/uL (ref 0.7–4.0)
MCH: 31.4 pg (ref 26.0–34.0)
MCHC: 31.3 g/dL (ref 30.0–36.0)
MCV: 100.3 fL — ABNORMAL HIGH (ref 80.0–100.0)
Monocytes Absolute: 1 K/uL (ref 0.1–1.0)
Monocytes Relative: 11 %
Neutro Abs: 6 K/uL (ref 1.7–7.7)
Neutrophils Relative %: 64 %
Platelets: 223 K/uL (ref 150–400)
RBC: 3.31 MIL/uL — ABNORMAL LOW (ref 3.87–5.11)
RDW: 12.8 % (ref 11.5–15.5)
WBC: 9.1 K/uL (ref 4.0–10.5)
nRBC: 0.2 % (ref 0.0–0.2)

## 2024-10-18 LAB — BASIC METABOLIC PANEL WITH GFR
Anion gap: 10 (ref 5–15)
BUN: 11 mg/dL (ref 8–23)
CO2: 31 mmol/L (ref 22–32)
Calcium: 8.8 mg/dL — ABNORMAL LOW (ref 8.9–10.3)
Chloride: 97 mmol/L — ABNORMAL LOW (ref 98–111)
Creatinine, Ser: 0.56 mg/dL (ref 0.44–1.00)
GFR, Estimated: >60 mL/min (ref >60)
Glucose, Bld: 105 mg/dL — ABNORMAL HIGH (ref 70–99)
Potassium: 3.7 mmol/L (ref 3.5–5.1)
Sodium: 138 mmol/L (ref 135–145)

## 2024-10-18 LAB — CK: Total CK: 66 U/L (ref 38–234)

## 2024-10-18 MED ORDER — HYDROMORPHONE HCL 2 MG PO TABS
2.0000 mg | ORAL_TABLET | Freq: Once | ORAL | Status: AC
Start: 2024-10-18 — End: 2024-10-18
  Administered 2024-10-18: 2 mg via ORAL
  Filled 2024-10-18: qty 1

## 2024-10-18 NOTE — Discharge Instructions (Signed)
 Your x-rays this evening are reassuring.  Please be sure to keep your upcoming appointment with your orthopedic provider for recheck.  Also call your primary care provider to schedule follow-up appointment.  Return to the emergency department for any new or worsening symptoms.

## 2024-10-18 NOTE — ED Notes (Signed)
 ED Provider at bedside.

## 2024-10-18 NOTE — ED Provider Notes (Signed)
 [place] EMERGENCY DEPARTMENT AT [facility] Provider Note   CSN: [id number] Arrival date & time: 10/18/24  1727     Patient presents with: Knee Pain   Norma Anthony is a 68 y.o. female.    Knee Pain Associated symptoms: back pain   Associated symptoms: no fever and no neck pain         Norma Anthony is a 68 y.o. female with past medical history of COPD, fibromyalgia, opioid dependence, anxiety, PE, currently anticoagulated, anxiety disorder, coronary artery disease and 6 days status postop from total knee arthroplasty.  She is here for evaluation of low back pain, hip pain, right knee and right ankle pain after a mechanical fall from her bed.  She states that she was lying in bed earlier and reached over to her nightstand to get her pain medication and the table slipped and she accidentally fell approximately 2-1/2 feet into the floor.  She was unable to get up and was lying in the floor for approximately 5 hours.  She endorses increased pain of her knee since the fall.  She denies any head injury or LOC. No increased swelling of the right lower extremity, numbness or weakness    Prior to Admission medications   Medication Sig Start Date End Date Taking? Authorizing Provider  albuterol  (PROVENTIL ) (2.5 MG/3ML) 0.083% nebulizer solution Take 2.5 mg by nebulization every 4 (four) hours as needed for wheezing or shortness of breath. 08/17/24   [provider]  albuterol  (VENTOLIN  HFA) 108 (90 Base) MCG/ACT inhaler Inhale 2 puffs into the lungs every 6 (six) hours as needed for wheezing or shortness of breath. 05/12/21   [provider]  ALPRAZolam  (XANAX ) 1 MG tablet Take 1 tablet (1 mg total) by mouth every 6 (six) hours as needed for anxiety or sleep. Patient taking differently: Take 1-2 mg by mouth every 6 (six) hours as needed for anxiety or sleep. 03/16/23   Patti Rosina SAUNDERS, PA-C  ascorbic acid  (VITAMIN C ) 500 MG tablet Take 500 mg by mouth daily.    [provider]  b complex vitamins capsule Take 1 capsule by mouth daily.    [provider]  cephALEXin  (KEFLEX ) 500 MG capsule Take 500 mg by mouth 2 (two) times daily. 05/19/24   [provider]  Cholecalciferol (VITAMIN D3) 125 MCG (5000 UT) CAPS Take 5,000 Units by mouth daily.    [provider]  clotrimazole -betamethasone  (LOTRISONE ) cream Apply 1 Application topically 2 (two) times daily as needed (for irritation under the breasts or abdominal folds).    [provider]  docusate sodium  (COLACE) 100 MG capsule Take 1 capsule (100 mg total) by mouth 2 (two) times daily. 10/15/24 11/15/24  Kay Kemps, MD  DULoxetine  (CYMBALTA ) 60 MG capsule Take 60 mg by mouth at bedtime. 09/14/18   [provider]  famotidine  (PEPCID ) 20 MG tablet TAKE 1 TABLET BY MOUTH AFTER SUPPER 04/01/23   Sood, Vineet, MD  gabapentin  (NEURONTIN ) 400 MG capsule Take 400 mg by mouth 3 (three) times daily. 08/21/22   [provider]  HYDROmorphone  (DILAUDID ) 2 MG tablet Take 1-2 tablets (2-4 mg total) by mouth every 4 (four) hours as needed for severe pain (pain score 7-10). 10/15/24   Kay Kemps, MD  naloxone  (NARCAN ) nasal spray 4 mg/0.1 mL Place 1 spray into the nose once as needed (accidental over dose). 10/16/21   [provider]  nutrition supplement, JUVEN, (JUVEN) PACK Take 1 packet by mouth daily.  [provider]  nystatin  (MYCOSTATIN /NYSTOP ) powder Apply topically 3 (three) times daily. 10/15/24   Kay Kemps, MD  ondansetron  (ZOFRAN ) 4 MG tablet Take 1 tablet (4 mg total) by mouth every 8 (eight) hours as needed for nausea or vomiting. 10/15/24 10/15/25  Kay Kemps, MD  pantoprazole  (PROTONIX ) 40 MG tablet Take 40 mg by mouth every morning. 06/30/16   [provider]  polyethylene glycol (MIRALAX  / GLYCOLAX ) 17 g packet Take 17 g by mouth daily.    [provider]  promethazine  (PHENERGAN ) 25 MG tablet Take 25 mg by mouth  every 6 (six) hours as needed for nausea or vomiting.    [provider]  senna (SENOKOT) 8.6 MG TABS tablet Take 2 tablets (17.2 mg total) by mouth at bedtime for 15 days. 10/15/24 10/31/24  Kay Kemps, MD  tiZANidine  (ZANAFLEX ) 2 MG tablet Take 2 mg by mouth every 12 (twelve) hours as needed for muscle spasms. 02/29/24   [provider]  XARELTO  20 MG TABS tablet Take 20 mg by mouth daily with supper.    [provider]    Allergies: Belbuca  [buprenorphine  hcl], Lyrica [pregabalin], Compazine  [prochlorperazine ], Droperidol, and Ergotamine-caffeine    Review of Systems  Constitutional:  Negative for chills and fever.  Respiratory:  Negative for shortness of breath.   Cardiovascular:  Negative for chest pain.  Gastrointestinal:  Negative for nausea and vomiting.  Musculoskeletal:  Positive for arthralgias and back pain. Negative for neck pain.  Skin:  Negative for color change.  Neurological:  Negative for weakness and numbness.    Updated Vital Signs BP 102/62 (BP Location: Left Arm)   Pulse 79   Temp 98.3 F (36.8 C) (Oral)   Resp 16   SpO2 98%   Physical Exam Vitals and nursing note reviewed.  Constitutional:      Appearance: Normal appearance.  HENT:     Head: Atraumatic.     Comments: No palpable edema or hematomas    Mouth/Throat:     Mouth: Mucous membranes are moist.  Cardiovascular:     Rate and Rhythm: Normal rate and regular rhythm.     Pulses: Normal pulses.  Pulmonary:     Effort: Pulmonary effort is normal.     Breath sounds: Normal breath sounds.  Abdominal:     Palpations: Abdomen is soft.     Tenderness: There is no abdominal tenderness.  Musculoskeletal:        General: Tenderness and signs of injury present.     Cervical back: Normal range of motion. No tenderness.     Comments: Ttp of the right knee. Moderate edema and old appearing ecchymosis surrounding the knee.  No excessive warmth or erythema.  Also tender with ROM of  the right knee, right ankle.  Tender to bilateral hips and lower lumbar spine.  No bony deformities.   Skin:    General: Skin is warm.     Capillary Refill: Capillary refill takes less than 2 seconds.  Neurological:     General: No focal deficit present.     Mental Status: She is alert.     Sensory: No sensory deficit.     Motor: No weakness.     (all labs ordered are listed, but only abnormal results are displayed) Labs Reviewed  CBC WITH DIFFERENTIAL/PLATELET - Abnormal; Notable for the following components:      Result Value   RBC 3.31 (*)    Hemoglobin 10.4 (*)    HCT 33.2 (*)  MCV 100.3 (*)    All other components within normal limits  BASIC METABOLIC PANEL WITH GFR - Abnormal; Notable for the following components:   Chloride 97 (*)    Glucose, Bld 105 (*)    Calcium 8.8 (*)    All other components within normal limits  CK  URINALYSIS, ROUTINE W REFLEX MICROSCOPIC    EKG: EKG Interpretation Date/Time:  Wednesday October 18 2024 19:43:32 EST Ventricular Rate:  81 PR Interval:  154 QRS Duration:  94 QT Interval:  394 QTC Calculation: 457 R Axis:   -14  Text Interpretation: Normal sinus rhythm Moderate voltage criteria for LVH, may be normal variant ( R in aVL , [employer] product ) Borderline ECG When compared with ECG of 16-Sep-2024 20:24, PREVIOUS ECG IS PRESENT Confirmed by Tonia Chew [phone]) on 10/19/2024 7:46:32 PM  Radiology: [facility] Knee Complete 4 Views Right Result Date: 10/18/2024 CLINICAL DATA:  Recent knee surgery status post fall. EXAM: RIGHT KNEE - COMPLETE 4+ VIEW COMPARISON:  October 12, 2024 FINDINGS: There is an intact right knee replacement. No evidence of acute fracture or dislocation. A small to moderate sized suprapatellar effusion is noted. Diffuse soft tissue swelling is present with multiple anterior surgical staples seen along the midline. IMPRESSION: 1. Intact right knee replacement. 2. Small to moderate sized suprapatellar effusion.  Electronically Signed   By: Suzen Dials M.D.   On: 10/18/2024 20:38   CT ABDOMEN PELVIS WO CONTRAST Result Date: 10/18/2024 CLINICAL DATA:  Fall abdominal trauma. EXAM: CT ABDOMEN AND PELVIS WITHOUT CONTRAST TECHNIQUE: Multidetector CT imaging of the abdomen and pelvis was performed following the standard protocol without IV contrast. RADIATION DOSE REDUCTION: This exam was performed according to the departmental dose-optimization program which includes automated exposure control, adjustment of the mA and/or kV according to patient size and/or use of iterative reconstruction technique. COMPARISON:  CT abdomen pelvis dated 08/11/2024. FINDINGS: Evaluation of this exam is limited in the absence of intravenous contrast. Lower chest: No acute abnormality. No intra-abdominal free air or free fluid. Hepatobiliary: Fatty liver. No biliary dilatation. The gallbladder is unremarkable. Pancreas: Unremarkable. No pancreatic ductal dilatation or surrounding inflammatory changes. Spleen: Normal in size without focal abnormality. Adrenals/Urinary Tract: The adrenal glands are unremarkable. There is a 5 mm linear nonobstructing right renal lower pole calculus. No hydronephrosis. The left kidney is unremarkable. The visualized ureters and urinary bladder unremarkable. Stomach/Bowel: There is moderate stool throughout the colon. There is a moderate size hiatal hernia. There is no bowel obstruction or active inflammation. The appendix is not visualized with certainty. No inflammatory changes identified in the right lower quadrant. Vascular/Lymphatic: Mild aortoiliac atherosclerotic disease. The IVC is unremarkable. An infrarenal IVC filter noted. No portal venous gas. There is no adenopathy. Reproductive: Hysterectomy.  No suspicious adnexal masses. Other: Ventral hernia repair mesh. Musculoskeletal: Osteopenia with degenerative changes of the spine. No acute osseous pathology. IMPRESSION: 1. No acute intra-abdominal or  pelvic pathology. 2. Fatty liver. 3. A 5 mm nonobstructing right renal lower pole calculus. No hydronephrosis. 4. Moderate size hiatal hernia. 5.  Aortic Atherosclerosis (ICD10-I70.0). Electronically Signed   By: Vanetta Chou M.D.   On: 10/18/2024 20:38   CT L-SPINE NO CHARGE Result Date: 10/18/2024 CLINICAL DATA:  Trauma. EXAM: CT LUMBAR SPINE WITHOUT CONTRAST TECHNIQUE: Multidetector CT imaging of the lumbar spine was performed without intravenous contrast administration. Multiplanar CT image reconstructions were also generated. RADIATION DOSE REDUCTION: This exam was performed according to the departmental dose-optimization program which includes automated exposure control,  adjustment of the mA and/or kV according to patient size and/or use of iterative reconstruction technique. COMPARISON:  Lumbar spine CT dated 11/20/2022. FINDINGS: Segmentation: 5 lumbar type vertebrae. Alignment: No acute subluxation. Vertebrae: No acute fracture.  Osteopenia. Paraspinal and other soft tissues: Negative. Disc levels: No acute findings no degenerative changes. IMPRESSION: No acute/traumatic lumbar spine pathology. Electronically Signed   By: Vanetta Chou M.D.   On: 10/18/2024 20:38   DG Ankle Complete Right Result Date: 10/18/2024 CLINICAL DATA:  Status post fall. EXAM: RIGHT ANKLE - COMPLETE 3+ VIEW COMPARISON:  None Available. FINDINGS: There is no evidence of fracture, dislocation, or joint effusion. There is no evidence of arthropathy or other focal bone abnormality. Soft tissues are unremarkable. IMPRESSION: Negative. Electronically Signed   By: Suzen Dials M.D.   On: 10/18/2024 20:34   CT Head Wo Contrast Result Date: 10/18/2024 EXAM: CT HEAD WITHOUT CONTRAST 10/18/2024 08:24:15 PM TECHNIQUE: CT of the head was performed without the administration of intravenous contrast. Automated exposure control, iterative reconstruction, and/or weight based adjustment of the mA/kV was utilized to reduce the  radiation dose to as low as reasonably achievable. COMPARISON: 08/11/2024 CLINICAL HISTORY: Head trauma, minor (Age >= 65y) FINDINGS: BRAIN AND VENTRICLES: No acute hemorrhage. No evidence of acute infarct. No hydrocephalus. No extra-axial collection. No mass effect or midline shift. There is atrophy and chronic small vessel disease throughout the deep white matter. ORBITS: No acute abnormality. SINUSES: No acute abnormality. SOFT TISSUES AND SKULL: No acute soft tissue abnormality. No skull fracture. IMPRESSION: 1. No acute intracranial abnormality. 2. Chronic small vessel ischemic changes and cerebral atrophy. Electronically signed by: Franky Crease MD 10/18/2024 08:27 PM EST RP Workstation: [id number]     Procedures   Medications Ordered in the ED - No data to display                                  Medical Decision Making   Patient here from home 6 days status post right total knee replacement fell out of bed onto the floor after attempting to reach for pain medication.  Was lying in the floor unable to get up for approximately 5 hours.  Here for evaluation of her injuries.  She denies any head injury or LOC.  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  Denies any chest pain or abdominal pain nausea or vomiting.  No headache or dizziness.  Likely contusions, fracture, dislocation, hardware disruption, rhabdomyolysis all considered.  Amount and/or Complexity of Data Reviewed Labs: ordered.    Details: Labs without acute abnormality her total CK is reassuring Radiology: ordered.    Details: X-ray of the right knee shows an intact right knee replacement.  No acute bony injuries of the right ankle  CT abdomen and pelvis and CT L-spine no charge without acute or traumatic lumbar spine findings no acute intra-abdominal or pelvic finding and CT head w/o acute finding ECG/medicine tests: ordered.    Details: EKG shows normal sinus rhythm moderate voltage criteria for LVH Discussion of  management or test interpretation with external provider(s):   Work up reassuring.  She appears appropriate for d/c home.  Has pain medication and upcoming f/u with her orthopedic provider.  Given return precautions  Risk Prescription drug management.        Final diagnoses:  Fall from bed, initial encounter    ED Discharge Orders     None  Herlinda Milling, PA-C 10/20/24 1714    Yolande Lamar BROCKS, MD 10/24/24 [phone]

## 2024-10-18 NOTE — ED Triage Notes (Signed)
 Pt arrived via [facility] c/o R knee pain 10/10, pt recently had surgery on R knee last Thursday and wants it checked out. Also, per EMS, pt states she slid out of bed today. Denies Loc denies hitting head--stated she was reaching for her pain medication on the night stand. Pt on 2 L via [place] baseline at night time

## 2024-11-06 ENCOUNTER — Other Ambulatory Visit (HOSPITAL_COMMUNITY): Payer: Self-pay

## 2024-11-06 ENCOUNTER — Other Ambulatory Visit: Payer: Self-pay

## 2024-11-11 ENCOUNTER — Other Ambulatory Visit (HOSPITAL_COMMUNITY): Payer: Self-pay

## 2024-11-12 ENCOUNTER — Emergency Department (HOSPITAL_COMMUNITY)
Admission: EM | Admit: 2024-11-12 | Discharge: 2024-11-12 | Disposition: A | Discharge status: Home / Self Care | Attending: Emergency Medicine | Admitting: Emergency Medicine

## 2024-11-12 ENCOUNTER — Emergency Department (HOSPITAL_COMMUNITY)

## 2024-11-12 ENCOUNTER — Other Ambulatory Visit: Payer: Self-pay

## 2024-11-12 DIAGNOSIS — M79604 Pain in right leg: Secondary | ICD-10-CM | POA: Insufficient documentation

## 2024-11-12 DIAGNOSIS — N200 Calculus of kidney: Secondary | ICD-10-CM | POA: Diagnosis not present

## 2024-11-12 DIAGNOSIS — K449 Diaphragmatic hernia without obstruction or gangrene: Secondary | ICD-10-CM | POA: Diagnosis not present

## 2024-11-12 DIAGNOSIS — M545 Low back pain, unspecified: Secondary | ICD-10-CM | POA: Insufficient documentation

## 2024-11-12 DIAGNOSIS — I700 Atherosclerosis of aorta: Secondary | ICD-10-CM | POA: Diagnosis not present

## 2024-11-12 DIAGNOSIS — M4854XA Collapsed vertebra, not elsewhere classified, thoracic region, initial encounter for fracture: Secondary | ICD-10-CM | POA: Insufficient documentation

## 2024-11-12 DIAGNOSIS — R10A1 Flank pain, right side: Secondary | ICD-10-CM | POA: Diagnosis present

## 2024-11-12 DIAGNOSIS — K760 Fatty (change of) liver, not elsewhere classified: Secondary | ICD-10-CM | POA: Insufficient documentation

## 2024-11-12 DIAGNOSIS — J449 Chronic obstructive pulmonary disease, unspecified: Secondary | ICD-10-CM | POA: Insufficient documentation

## 2024-11-12 LAB — COMPREHENSIVE METABOLIC PANEL WITH GFR
ALT: 13 U/L (ref 0–44)
AST: 28 U/L (ref 15–41)
Albumin: 3.6 g/dL (ref 3.5–5.0)
Alkaline Phosphatase: 141 U/L — ABNORMAL HIGH (ref 38–126)
Anion gap: 12 (ref 5–15)
BUN: 9 mg/dL (ref 8–23)
CO2: 27 mmol/L (ref 22–32)
Calcium: 8.9 mg/dL (ref 8.9–10.3)
Chloride: 106 mmol/L (ref 98–111)
Creatinine, Ser: 0.63 mg/dL (ref 0.44–1.00)
GFR, Estimated: >60 mL/min (ref >60)
Glucose, Bld: 94 mg/dL (ref 70–99)
Potassium: 3.7 mmol/L (ref 3.5–5.1)
Sodium: 145 mmol/L (ref 135–145)
Total Bilirubin: 0.3 mg/dL (ref 0.0–1.2)
Total Protein: 6.6 g/dL (ref 6.5–8.1)

## 2024-11-12 LAB — CBC
HCT: 43.6 % (ref 36.0–46.0)
Hemoglobin: 12.9 g/dL (ref 12.0–15.0)
MCH: 29.6 pg (ref 26.0–34.0)
MCHC: 29.6 g/dL — ABNORMAL LOW (ref 30.0–36.0)
MCV: 100 fL (ref 80.0–100.0)
Platelets: 154 K/uL (ref 150–400)
RBC: 4.36 MIL/uL (ref 3.87–5.11)
RDW: 13.7 % (ref 11.5–15.5)
WBC: 6.1 K/uL (ref 4.0–10.5)
nRBC: 0 % (ref 0.0–0.2)

## 2024-11-12 LAB — URINALYSIS, ROUTINE W REFLEX MICROSCOPIC
Bacteria, UA: NONE SEEN
Glucose, UA: NEGATIVE mg/dL
Hgb urine dipstick: NEGATIVE
Ketones, ur: NEGATIVE mg/dL
Leukocytes,Ua: NEGATIVE
Nitrite: NEGATIVE
Protein, ur: 30 mg/dL — AB
Specific Gravity, Urine: 1.027 (ref 1.005–1.030)
pH: 5 (ref 5.0–8.0)

## 2024-11-12 LAB — LIPASE, BLOOD: Lipase: <10 U/L — ABNORMAL LOW (ref 11–51)

## 2024-11-12 MED ORDER — KETOROLAC TROMETHAMINE 60 MG/2ML IM SOLN
30.0000 mg | Freq: Once | INTRAMUSCULAR | Status: AC
  Administered 2024-11-12: 30 mg via INTRAMUSCULAR
  Filled 2024-11-12: qty 2

## 2024-11-12 MED ORDER — METOCLOPRAMIDE HCL 10 MG PO TABS
10.0000 mg | ORAL_TABLET | Freq: Once | ORAL | Status: AC
  Administered 2024-11-12: 10 mg via ORAL
  Filled 2024-11-12: qty 1

## 2024-11-12 MED ORDER — ONDANSETRON 4 MG PO TBDP
4.0000 mg | ORAL_TABLET | Freq: Once | ORAL | Status: AC
  Administered 2024-11-12: 4 mg via ORAL
  Filled 2024-11-12: qty 1

## 2024-11-12 MED ORDER — KETOROLAC TROMETHAMINE 30 MG/ML IJ SOLN
15.0000 mg | Freq: Once | INTRAMUSCULAR | Status: DC
  Filled 2024-11-12: qty 1

## 2024-11-12 MED ORDER — ONDANSETRON HCL 4 MG/2ML IJ SOLN
4.0000 mg | Freq: Once | INTRAMUSCULAR | Status: DC
  Filled 2024-11-12: qty 2

## 2024-11-12 NOTE — ED Notes (Signed)
 Pt unable to void at this time; stated she voided a large amount before arrival.

## 2024-11-12 NOTE — Discharge Instructions (Addendum)
Please follow-up with your primary care provider for recheck.  Return to the emergency department for any new or worsening symptoms. ?

## 2024-11-12 NOTE — ED Triage Notes (Signed)
 Pt c/o abd pain and back pain for about a week.

## 2024-11-13 NOTE — ED Provider Notes (Signed)
 [place] EMERGENCY DEPARTMENT AT [facility] Provider Note   CSN: [id number] Arrival date & time: 11/12/24  0930     Patient presents with: Back Pain and Abdominal Pain   Norma Anthony is a 68 y.o. female.    Back Pain Associated symptoms: abdominal pain   Abdominal Pain      Norma Anthony is a 67 y.o. female history of depression, fibromyalgia, COPD, arthritis, anemia and GAD who presents to the Emergency Department complaining of right flank pain and back pain for 1 week.  Describes pain worse with standing or walking.  Improves at rest.  No numbness or weakness of her lower extremities.  Feels as though the pain radiates to her right hip and groin area.  No dysuria symptoms fever chills nausea or vomiting.  No known injury.  Prior to Admission medications   Medication Sig Start Date End Date Taking? Authorizing Provider  albuterol  (PROVENTIL ) (2.5 MG/3ML) 0.083% nebulizer solution Take 2.5 mg by nebulization every 4 (four) hours as needed for wheezing or shortness of breath. 08/17/24   [provider]  albuterol  (VENTOLIN  HFA) 108 (90 Base) MCG/ACT inhaler Inhale 2 puffs into the lungs every 6 (six) hours as needed for wheezing or shortness of breath. 05/12/21   [provider]  ALPRAZolam  (XANAX ) 1 MG tablet Take 1 tablet (1 mg total) by mouth every 6 (six) hours as needed for anxiety or sleep. Patient taking differently: Take 1-2 mg by mouth every 6 (six) hours as needed for anxiety or sleep. 03/16/23   Patti Rosina SAUNDERS, PA-C  ascorbic acid  (VITAMIN C ) 500 MG tablet Take 500 mg by mouth daily.    [provider]  b complex vitamins capsule Take 1 capsule by mouth daily.    [provider]  cephALEXin  (KEFLEX ) 500 MG capsule Take 500 mg by mouth 2 (two) times daily. 05/19/24   [provider]  Cholecalciferol  (VITAMIN D3) 125 MCG (5000 UT) CAPS Take 5,000 Units by mouth daily.    [provider]  clotrimazole -betamethasone   (LOTRISONE ) cream Apply 1 Application topically 2 (two) times daily as needed (for irritation under the breasts or abdominal folds).    [provider]  docusate sodium  (COLACE) 100 MG capsule Take 1 capsule (100 mg total) by mouth 2 (two) times daily. 10/15/24 11/15/24  Kay Kemps, MD  DULoxetine  (CYMBALTA ) 60 MG capsule Take 60 mg by mouth at bedtime. 09/14/18   [provider]  famotidine  (PEPCID ) 20 MG tablet TAKE 1 TABLET BY MOUTH AFTER SUPPER 04/01/23   Sood, Vineet, MD  gabapentin  (NEURONTIN ) 400 MG capsule Take 400 mg by mouth 3 (three) times daily. 08/21/22   [provider]  HYDROmorphone  (DILAUDID ) 2 MG tablet Take 1-2 tablets (2-4 mg total) by mouth every 4 (four) hours as needed for severe pain (pain score 7-10). 10/15/24   Kay Kemps, MD  naloxone  (NARCAN ) nasal spray 4 mg/0.1 mL Place 1 spray into the nose once as needed (accidental over dose). 10/16/21   [provider]  nutrition supplement, JUVEN, (JUVEN) PACK Take 1 packet by mouth daily.    [provider]  nystatin  (MYCOSTATIN /NYSTOP ) powder Apply topically 3 (three) times daily. 10/15/24   Kay Kemps, MD  ondansetron  (ZOFRAN ) 4 MG tablet Take 1 tablet (4 mg total) by mouth every 8 (eight) hours as needed for nausea or vomiting. 10/15/24 10/15/25  Kay Kemps, MD  pantoprazole  (PROTONIX ) 40 MG tablet Take 40 mg by mouth every morning. 06/30/16  [provider]  polyethylene glycol (MIRALAX  / GLYCOLAX ) 17 g packet Take 17 g by mouth daily.    [provider]  promethazine  (PHENERGAN ) 25 MG tablet Take 25 mg by mouth every 6 (six) hours as needed for nausea or vomiting.    [provider]  tiZANidine  (ZANAFLEX ) 2 MG tablet Take 2 mg by mouth every 12 (twelve) hours as needed for muscle spasms. 02/29/24   [provider]  XARELTO  20 MG TABS tablet Take 20 mg by mouth daily with supper.    [provider]    Allergies: Belbuca  [buprenorphine   hcl], Lyrica [pregabalin], Compazine  [prochlorperazine ], Droperidol, and Ergotamine-caffeine    Review of Systems  Gastrointestinal:  Positive for abdominal pain.  Genitourinary:  Positive for flank pain.  Musculoskeletal:  Positive for back pain.  All other systems reviewed and are negative.   Updated Vital Signs BP (!) 140/85 (BP Location: Right Arm)   Pulse 82   Temp 97.7 F (36.5 C) (Oral)   Resp 20   Ht 5' 5 (1.651 m)   Wt 99.8 kg   SpO2 93%   BMI 36.61 kg/m   Physical Exam Vitals and nursing note reviewed.  Constitutional:      General: She is not in acute distress.    Appearance: She is not toxic-appearing.  HENT:     Mouth/Throat:     Mouth: Mucous membranes are moist.  Cardiovascular:     Rate and Rhythm: Normal rate and regular rhythm.     Pulses: Normal pulses.  Pulmonary:     Effort: Pulmonary effort is normal.  Chest:     Chest wall: No tenderness.  Abdominal:     Palpations: Abdomen is soft.     Tenderness: There is no abdominal tenderness.  Musculoskeletal:        General: Tenderness present. No swelling or signs of injury.     Comments: Tender to palpation right lower lumbar paraspinal muscles.  No midline tenderness.  There is some pain with straight leg raise on the right.  No significant tenderness with range of motion of the right hip  Skin:    General: Skin is warm.     Capillary Refill: Capillary refill takes less than 2 seconds.     Findings: No erythema or rash.  Neurological:     General: No focal deficit present.     Mental Status: She is alert.     Sensory: No sensory deficit.     Motor: No weakness.     (all labs ordered are listed, but only abnormal results are displayed) Labs Reviewed  LIPASE, BLOOD - Abnormal; Notable for the following components:      Result Value   Lipase <10 (*)    All other components within normal limits  COMPREHENSIVE METABOLIC PANEL WITH GFR - Abnormal; Notable for the following components:   Alkaline  Phosphatase 141 (*)    All other components within normal limits  CBC - Abnormal; Notable for the following components:   MCHC 29.6 (*)    All other components within normal limits  URINALYSIS, ROUTINE W REFLEX MICROSCOPIC - Abnormal; Notable for the following components:   Color, Urine AMBER (*)    APPearance HAZY (*)    Bilirubin Urine SMALL (*)    Protein, ur 30 (*)    All other components within normal limits    EKG: None  Radiology: CT Renal Stone Study Result Date: 11/12/2024 CLINICAL DATA:  Right flank pain and back  pain 1 week. History of kidney stones. EXAM: CT ABDOMEN AND PELVIS WITHOUT CONTRAST TECHNIQUE: Multidetector CT imaging of the abdomen and pelvis was performed following the standard protocol without IV contrast. RADIATION DOSE REDUCTION: This exam was performed according to the departmental dose-optimization program which includes automated exposure control, adjustment of the mA and/or kV according to patient size and/or use of iterative reconstruction technique. COMPARISON:  10/18/2024, 04/26/2024 and 08/11/2024 FINDINGS: Lower chest: Heart is normal in size. Moderate size hiatal hernia unchanged with surgical sutures over the stomach. Lung bases are clear. Hepatobiliary: Mild diffuse low-attenuation of the liver without focal mass unchanged. Gallbladder and biliary tree are normal. Pancreas: Normal. Spleen: Normal. Adrenals/Urinary Tract: Adrenal glands are normal. Kidneys are normal in size without hydronephrosis or focal mass. 3-4 mm stone over the mid to lower pole right kidney unchanged. No perinephric inflammation or fluid. No left renal stones. Ureters and bladder are normal. Stomach/Bowel: Stomach as described above. Small bowel is normal. Appendix not visualized. 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 Colon is unremarkable. Vascular/Lymphatic: Minimal calcified plaque over the abdominal  aorta which is normal in caliber. No adenopathy. Reproductive: Prior hysterectomy.  Adnexal regions are unremarkable. Other: No significant free fluid or focal inflammatory change. Evidence of prior hernia repair over the mid to upper abdominal wall. Musculoskeletal: Stable mild anterior wedge compression deformity of T12. No new bony findings. IMPRESSION: 1. No acute findings in the abdomen/pelvis. 2. 3-4 mm nonobstructing right renal stone unchanged. 3. 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 4. Stable moderate size hiatal hernia. 5. Stable mild hepatic steatosis. 6. Aortic atherosclerosis. 7. Stable mild anterior wedge compression deformity of T12. Aortic Atherosclerosis (ICD10-I70.0). Electronically Signed   By: Toribio Agreste M.D.   On: 11/12/2024 11:33     Procedures   Medications Ordered in the ED  ondansetron  (ZOFRAN -ODT) disintegrating tablet 4 mg (4 mg Oral Given 11/12/24 1147)  ketorolac  (TORADOL ) injection 30 mg (30 mg Intramuscular Given 11/12/24 1148)  metoCLOPramide  (REGLAN ) tablet 10 mg (10 mg Oral Given 11/12/24 1331)                                    Medical Decision Making   Patient here for evaluation right flank right low back pain.  Pain radiates into right abdomen/groin area.  No reported fever, dysuria nausea or vomiting.  Pain is aggravated with movement, improves at rest.  On my exam, patient is nontoxic-appearing.  She is somewhat somnolent and admits to taking her Dilaudid  tablets prior to arrival.  She has a reassuring abdominal exam.  There is some tenderness with range of motion and palpation of the lumbar paraspinal muscles.  Doubt this is emergent process.  I am concerned that she is already heavily medicated.  She is requesting additional pain medications and antiemetic.  Will check labs to proceed with CT imaging to rule out kidney stone.  I doubt this is acute abdominal process or  cauda equina.  No reported fall or other injury.  I do not feel that additional narcotic medications are warranted at this time.  There is no active vomiting here.  Her kidney functions were reassuring on previous visit, will give Toradol  for pain and reassess  Amount and/or Complexity of Data Reviewed Labs: ordered.    Details:  Labs reassuring.  Urinalysis without evidence of infection or hematuria Radiology: ordered.    Details: CT renal stone study without acute findings of the abdomen or pelvis.  No obstructive uropathy.  Of note, radiologist notes decreasing size of previous visualized mesenteric hematoma.  I have discussed this finding with the available radiologist.  It was noted that hematoma was 5 cm and may of this year and has continued to decrease in size and now 1.4 cm   Discussion of management or test interpretation with external provider(s):   On recheck, patient resting comfortably.  Her vital signs are reassuring.  Her nausea has resolved.  She has Dilaudid  tablets for pain at home along with antiemetics.  I do not feel that additional pain medication is indicated at this time.  Doubt emergent process no exam findings or red flags suggestive of cauda equina.  She is tolerating liquids and appears appropriate for discharge home.  I recommended close outpatient follow-up with her PCP.  Risk Prescription drug management.        Final diagnoses:  Right flank pain    ED Discharge Orders     None          Herlinda Milling, PA-C 11/13/24 1511    Suzette Pac, MD 11/14/24 1034
